# Patient Record
Sex: Female | Born: 1935
Health system: Southern US, Community
[De-identification: ages and names within clinical notes are randomized; demographics above are authoritative.]

## PROBLEM LIST (undated history)

## (undated) DIAGNOSIS — J302 Other seasonal allergic rhinitis: Secondary | ICD-10-CM

## (undated) DIAGNOSIS — H409 Unspecified glaucoma: Secondary | ICD-10-CM

## (undated) DIAGNOSIS — I639 Cerebral infarction, unspecified: Secondary | ICD-10-CM

## (undated) DIAGNOSIS — M1990 Unspecified osteoarthritis, unspecified site: Secondary | ICD-10-CM

## (undated) DIAGNOSIS — K219 Gastro-esophageal reflux disease without esophagitis: Secondary | ICD-10-CM

## (undated) DIAGNOSIS — F419 Anxiety disorder, unspecified: Secondary | ICD-10-CM

## (undated) DIAGNOSIS — I739 Peripheral vascular disease, unspecified: Secondary | ICD-10-CM

## (undated) DIAGNOSIS — J45909 Unspecified asthma, uncomplicated: Secondary | ICD-10-CM

## (undated) DIAGNOSIS — H3530 Unspecified macular degeneration: Secondary | ICD-10-CM

## (undated) DIAGNOSIS — R600 Localized edema: Secondary | ICD-10-CM

## (undated) DIAGNOSIS — R001 Bradycardia, unspecified: Secondary | ICD-10-CM

## (undated) DIAGNOSIS — I10 Essential (primary) hypertension: Secondary | ICD-10-CM

## (undated) DIAGNOSIS — T50905A Adverse effect of unspecified drugs, medicaments and biological substances, initial encounter: Secondary | ICD-10-CM

## (undated) HISTORY — DX: Unspecified glaucoma: H40.9

## (undated) HISTORY — DX: Unspecified osteoarthritis, unspecified site: M19.90

## (undated) HISTORY — DX: Localized edema: R60.0

## (undated) HISTORY — DX: Unspecified asthma, uncomplicated: J45.909

## (undated) HISTORY — DX: Essential (primary) hypertension: I10

## (undated) HISTORY — DX: Gastro-esophageal reflux disease without esophagitis: K21.9

## (undated) HISTORY — DX: Adverse effect of unspecified drugs, medicaments and biological substances, initial encounter: T50.905A

## (undated) HISTORY — DX: Unspecified macular degeneration: H35.30

## (undated) HISTORY — DX: Other seasonal allergic rhinitis: J30.2

## (undated) HISTORY — DX: Cerebral infarction, unspecified: I63.9

## (undated) HISTORY — DX: Bradycardia, unspecified: R00.1

## (undated) HISTORY — DX: Anxiety disorder, unspecified: F41.9

## (undated) HISTORY — DX: Peripheral vascular disease, unspecified: I73.9

---
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
## 1979-10-08 HISTORY — PX: ABDOMINAL HYSTERECTOMY: SHX81

## 1998-08-25 ENCOUNTER — Ambulatory Visit (HOSPITAL_COMMUNITY): Admission: RE | Admit: 1998-08-25 | Discharge: 1998-08-25 | Payer: Self-pay | Admitting: *Deleted

## 1999-03-28 ENCOUNTER — Ambulatory Visit (HOSPITAL_BASED_OUTPATIENT_CLINIC_OR_DEPARTMENT_OTHER): Admission: RE | Admit: 1999-03-28 | Discharge: 1999-03-28 | Payer: Self-pay | Admitting: Ophthalmology

## 2000-05-06 ENCOUNTER — Encounter: Admission: RE | Admit: 2000-05-06 | Discharge: 2000-05-06 | Payer: Self-pay | Admitting: *Deleted

## 2000-05-06 ENCOUNTER — Encounter: Payer: Self-pay | Admitting: *Deleted

## 2000-05-22 ENCOUNTER — Emergency Department (HOSPITAL_COMMUNITY): Admission: EM | Admit: 2000-05-22 | Discharge: 2000-05-22 | Payer: Self-pay | Admitting: Emergency Medicine

## 2000-05-22 ENCOUNTER — Encounter: Payer: Self-pay | Admitting: Emergency Medicine

## 2000-05-27 ENCOUNTER — Encounter: Payer: Self-pay | Admitting: Neurological Surgery

## 2000-05-27 ENCOUNTER — Encounter: Admission: RE | Admit: 2000-05-27 | Discharge: 2000-05-27 | Payer: Self-pay | Admitting: Neurological Surgery

## 2000-06-13 ENCOUNTER — Encounter: Admission: RE | Admit: 2000-06-13 | Discharge: 2000-09-11 | Payer: Self-pay | Admitting: Orthopedic Surgery

## 2001-01-06 ENCOUNTER — Encounter: Payer: Self-pay | Admitting: Ophthalmology

## 2001-01-09 ENCOUNTER — Ambulatory Visit (HOSPITAL_COMMUNITY): Admission: RE | Admit: 2001-01-09 | Discharge: 2001-01-09 | Payer: Self-pay | Admitting: Ophthalmology

## 2001-03-30 ENCOUNTER — Ambulatory Visit (HOSPITAL_COMMUNITY): Admission: RE | Admit: 2001-03-30 | Discharge: 2001-03-30 | Payer: Self-pay | Admitting: Ophthalmology

## 2001-06-22 ENCOUNTER — Ambulatory Visit (HOSPITAL_COMMUNITY): Admission: RE | Admit: 2001-06-22 | Discharge: 2001-06-22 | Payer: Self-pay | Admitting: Ophthalmology

## 2001-07-30 ENCOUNTER — Encounter: Payer: Self-pay | Admitting: *Deleted

## 2001-07-30 ENCOUNTER — Encounter: Admission: RE | Admit: 2001-07-30 | Discharge: 2001-07-30 | Payer: Self-pay | Admitting: *Deleted

## 2002-08-02 ENCOUNTER — Encounter: Payer: Self-pay | Admitting: *Deleted

## 2002-08-02 ENCOUNTER — Encounter: Admission: RE | Admit: 2002-08-02 | Discharge: 2002-08-02 | Payer: Self-pay | Admitting: *Deleted

## 2002-10-19 ENCOUNTER — Encounter: Admission: RE | Admit: 2002-10-19 | Discharge: 2002-10-19 | Payer: Self-pay | Admitting: *Deleted

## 2002-10-19 ENCOUNTER — Encounter: Payer: Self-pay | Admitting: *Deleted

## 2002-10-20 LAB — HM DEXA SCAN

## 2002-10-26 ENCOUNTER — Ambulatory Visit (HOSPITAL_COMMUNITY): Admission: RE | Admit: 2002-10-26 | Discharge: 2002-10-26 | Payer: Self-pay | Admitting: *Deleted

## 2002-10-26 ENCOUNTER — Encounter: Payer: Self-pay | Admitting: *Deleted

## 2003-08-16 ENCOUNTER — Encounter: Admission: RE | Admit: 2003-08-16 | Discharge: 2003-08-16 | Payer: Self-pay | Admitting: *Deleted

## 2004-09-06 ENCOUNTER — Encounter: Admission: RE | Admit: 2004-09-06 | Discharge: 2004-09-06 | Payer: Self-pay | Admitting: Obstetrics and Gynecology

## 2005-09-19 ENCOUNTER — Encounter: Admission: RE | Admit: 2005-09-19 | Discharge: 2005-09-19 | Payer: Self-pay | Admitting: Internal Medicine

## 2005-11-01 ENCOUNTER — Ambulatory Visit (HOSPITAL_COMMUNITY): Admission: RE | Admit: 2005-11-01 | Discharge: 2005-11-01 | Payer: Self-pay | Admitting: Internal Medicine

## 2006-02-21 ENCOUNTER — Encounter: Admission: RE | Admit: 2006-02-21 | Discharge: 2006-02-21 | Payer: Self-pay | Admitting: Gastroenterology

## 2006-09-22 ENCOUNTER — Encounter: Admission: RE | Admit: 2006-09-22 | Discharge: 2006-09-22 | Payer: Self-pay | Admitting: Internal Medicine

## 2007-09-24 ENCOUNTER — Encounter: Admission: RE | Admit: 2007-09-24 | Discharge: 2007-09-24 | Payer: Self-pay | Admitting: Internal Medicine

## 2008-09-28 ENCOUNTER — Encounter: Admission: RE | Admit: 2008-09-28 | Discharge: 2008-09-28 | Payer: Self-pay | Admitting: Internal Medicine

## 2009-07-04 ENCOUNTER — Inpatient Hospital Stay (HOSPITAL_COMMUNITY): Admission: EM | Admit: 2009-07-04 | Discharge: 2009-07-06 | Payer: Self-pay | Admitting: Emergency Medicine

## 2009-07-04 ENCOUNTER — Emergency Department (HOSPITAL_COMMUNITY): Admission: EM | Admit: 2009-07-04 | Discharge: 2009-07-04 | Payer: Self-pay | Admitting: Family Medicine

## 2009-07-06 ENCOUNTER — Encounter (INDEPENDENT_AMBULATORY_CARE_PROVIDER_SITE_OTHER): Payer: Self-pay | Admitting: Internal Medicine

## 2010-06-26 ENCOUNTER — Encounter: Admission: RE | Admit: 2010-06-26 | Discharge: 2010-06-26 | Payer: Self-pay | Admitting: Anesthesiology

## 2010-10-28 ENCOUNTER — Encounter: Payer: Self-pay | Admitting: Internal Medicine

## 2011-01-11 LAB — DIFFERENTIAL
Lymphocytes Relative: 31 % (ref 12–46)
Lymphs Abs: 1.3 10*3/uL (ref 0.7–4.0)
Neutro Abs: 2.3 10*3/uL (ref 1.7–7.7)
Neutrophils Relative %: 57 % (ref 43–77)

## 2011-01-11 LAB — COMPREHENSIVE METABOLIC PANEL
CO2: 27 mEq/L (ref 19–32)
Calcium: 9 mg/dL (ref 8.4–10.5)
Creatinine, Ser: 0.86 mg/dL (ref 0.4–1.2)
GFR calc Af Amer: >60 mL/min (ref >60)
GFR calc non Af Amer: >60 mL/min (ref >60)
Glucose, Bld: 110 mg/dL — ABNORMAL HIGH (ref 70–99)
Total Bilirubin: 0.9 mg/dL (ref 0.3–1.2)
Total Protein: 7.2 g/dL (ref 6.0–8.3)

## 2011-01-11 LAB — BASIC METABOLIC PANEL
BUN: 13 mg/dL (ref 6–23)
CO2: 24 mEq/L (ref 19–32)
Calcium: 8.6 mg/dL (ref 8.4–10.5)
Chloride: 106 mEq/L (ref 96–112)
Creatinine, Ser: 0.98 mg/dL (ref 0.4–1.2)
Glucose, Bld: 120 mg/dL — ABNORMAL HIGH (ref 70–99)
Potassium: 3.4 mEq/L — ABNORMAL LOW (ref 3.5–5.1)

## 2011-01-11 LAB — CARDIAC PANEL(CRET KIN+CKTOT+MB+TROPI)
CK, MB: 1.7 ng/mL (ref 0.3–4.0)
CK, MB: 1.9 ng/mL (ref 0.3–4.0)
Relative Index: 1.5 (ref 0.0–2.5)
Relative Index: 1.6 (ref 0.0–2.5)
Total CK: 122 U/L (ref 7–177)
Troponin I: 0.01 ng/mL (ref 0.00–0.06)

## 2011-01-11 LAB — POCT CARDIAC MARKERS
CKMB, poc: 1.9 ng/mL (ref 1.0–8.0)
Myoglobin, poc: 122 ng/mL (ref 12–200)

## 2011-01-11 LAB — CBC
HCT: 36.4 % (ref 36.0–46.0)
Hemoglobin: 12.4 g/dL (ref 12.0–15.0)
Hemoglobin: 12.9 g/dL (ref 12.0–15.0)
MCHC: 34.1 g/dL (ref 30.0–36.0)
MCV: 94.8 fL (ref 78.0–100.0)
MCV: 95 fL (ref 78.0–100.0)
Platelets: 178 10*3/uL (ref 150–400)
Platelets: 181 10*3/uL (ref 150–400)
RBC: 3.84 MIL/uL — ABNORMAL LOW (ref 3.87–5.11)
RBC: 4.02 MIL/uL (ref 3.87–5.11)
WBC: 4.1 10*3/uL (ref 4.0–10.5)

## 2011-01-11 LAB — APTT: aPTT: 32 seconds (ref 24–37)

## 2011-01-11 LAB — MAGNESIUM: Magnesium: 2.3 mg/dL (ref 1.5–2.5)

## 2011-01-11 LAB — PROTIME-INR: Prothrombin Time: 13.6 seconds (ref 11.6–15.2)

## 2011-02-22 NOTE — Op Note (Signed)
[place]. [facility]  Patient:    [name] [name], [name] [name]                       MRN: [id number] Proc. Date: 03/31/01 Adm. Date:  [id number] Disc. Date: [id number] Attending:  Karenann Cai                           Operative Report  PREOPERATIVE DIAGNOSIS:  Immature cataract, left eye.  POSTOPERATIVE DIAGNOSIS:  Immature cataract, left eye.  OPERATION PERFORMED:  Kelman phacoemulsification of cataract, left eye.  SURGEON:  Marya Landry. Carlyle Lipa., M.D.  ANESTHESIA:  Local using Xylocaine 2% with Marcaine 0.75% with Wydase.  INDICATIONS FOR PROCEDURE:  The patient is a 77 year old lady who has undergone a cataract extraction from the right eye but now has developed a significant cataract of the left eye.  She has a visual acuity best corrected at 20/25 on the right and 20/100 on the left.  She complains of difficulty seeing to read.  Cataract extraction of the left eye was recommended.  She was admitted at this time for that purpose.  DESCRIPTION OF PROCEDURE:  Under influence of intravenous sedation, a Van Lint akinesia and retrobulbar anesthesia was given.  The patient was prepped and draped in the usual manner.  The lid speculum was inserted under the upper and lower lid of the left eye and a 4-0 silk traction suture was passed through the belly of the superior rectus muscle for traction.  A fornix based conjunctival flap was turned and hemostasis achieved by using cautery.  An incision was made in the sclera approximately 1 mm posterior to the limbus. This incision was dissected down to the clear cornea using a crescent blade. A side port incision was made at the 1:30 oclock position.  OcuCoat was injected into the eye through the side port incision.  The anterior chamber was then entered through the corneoscleral tunnel incision at the 11:30 oclock position.  An anterior capsulotomy was done using a bent 25 gauge needle.  The nucleus was  hydrodissected using Xylocaine. The KP handpiece was passed into the eye and the nucleus was emulsified without difficulty.  The residual cortical material was aspirated.  It was noted that there was a tear in the posterior capsule.  An anterior vitrectomy was done and an anterior chamber lens was seated across the iris into the chamber and without difficulty.  A peripheral iridectomy was done.  The corneoscleral wound was closed by using a combination of interrupted sutures of 8-0 Vicryl and 10-0 nylon.  After it was ascertained that the wound was airtight and watertight, the conjunctiva was closed using thermal cautery.  1 cc of Celestone and 0.5 cc of gentamicin were injected subconjunctivally.  Maxitrol ophthalmic ointment and Pilopine ointment and atropine drops were applied along with a patch and Fox shield.  The patient tolerated the procedure well and was discharged to the post anesthesia care unit in satisfactory condition.  She was instructed to rest today, to take Vicodin every four hours as needed for pain and to see me in the office tomorrow for further evaluation.  DISCHARGE DIAGNOSES:  Immature cataract, left eye. DD:  03/31/01 TD:  03/31/01 Job: [id number]

## 2011-02-22 NOTE — Op Note (Signed)
[place]. [facility]  Patient:    [name] [name], [name] [name]                       MRN: [id number] Adm. Date:  [id number] Disc. Date: [id number] Attending:  Karenann Cai                           Operative Report  PREOPERATIVE DIAGNOSIS:  Immature cataract, right eye.  POSTOPERATIVE DIAGNOSIS:  Immature cataract, right eye.  PROCEDURE:  Kelman phacoemulsification of cataract, right eye.  ANESTHESIA:  Local using Xylocaine 2% with Marcaine 0.75% with Wydase.  JUSTIFICATION FOR PROCEDURE:  This is a 77 year old lady who complains of blurring of vision with difficulty seeing to read.  She was evaluated and found to have a visual acuity best corrected at 20/100 on the right, 20/80 on the left.  There are bilateral immature cataracts, worse on the right than the left.  She also has a history of glaucoma, which is presently controlled with medications.  Cataract extraction of the right eye was recommended.  She is admitted now for that purpose.  DESCRIPTION OF PROCEDURE:  Under the influence of IV sedation and Van Lint akinesia, a retrobulbar anesthesia was given.  The patient was prepped and draped in the usual manner.  The lid speculum was inserted under the upper and lower lid of the right eye and a 4-0 silk traction suture was passed through the belly of the superior rectus muscle for retraction.  A fornix-based conjunctival flap was turned and hemostasis achieved by using cautery.  An incision made in the sclera approximately 1 mm posterior to the limbus.  This incision was dissected down to clear cornea using the crescent blade.  A side port incision was made at the 1:30 clock hour position, and Occucoat was injected into the eye through the side port incision.  The anterior chamber was then entered through the corneoscleral tunnel incision at the 11:30 position.  An anterior capsulotomy was done using a bent 25-gauge needle.  The nucleus was  hydrodissected using Xylocaine.  The KPE handpiece was passed into the eye, and the nucleus was emulsified without difficulty.  The residual cortical material was aspirated.  The posterior capsule was then polished using the olive-tip polisher.  The wound was widened slightly to accommodate a foldable silicone intraocular lens.  This lens was seated into the eye behind the iris into the capsular bag without difficulty.  The residual Occucoat was aspirated from the eye.  The anterior chamber was reformed, and the pupil was constricted using Miochol.  The wound was tested to make sure that it was airtight and watertight.  The conjunctiva was then closed over the corneoscleral wound using thermal cautery.  Celestone 1 cc and 0.5 cc of gentamicin were injected subconjunctivally.  Maxitrol ophthalmic ointment and Pilopine ointment were applied along with a patch and Fox shield.  The patient tolerated the procedure well, was discharged to postanesthesia recovery in satisfactory condition.  She was instructed to rest today, to take Vicodin every four hours as needed for pain, and to see me in my office tomorrow for further evaluation.  DISCHARGE DIAGNOSIS:  Immature cataract, right eye. DD:  01/10/01 TD:  01/10/01 Job: [id number]

## 2011-03-15 ENCOUNTER — Ambulatory Visit
Admission: RE | Admit: 2011-03-15 | Discharge: 2011-03-15 | Disposition: A | Discharge status: Home / Self Care | Payer: Medicare Other | Source: Ambulatory Visit | Attending: Allergy and Immunology | Admitting: Allergy and Immunology

## 2011-03-15 ENCOUNTER — Other Ambulatory Visit: Payer: Self-pay | Admitting: Allergy and Immunology

## 2011-03-15 DIAGNOSIS — R05 Cough: Secondary | ICD-10-CM

## 2011-10-14 DIAGNOSIS — K589 Irritable bowel syndrome without diarrhea: Secondary | ICD-10-CM | POA: Diagnosis not present

## 2011-10-14 DIAGNOSIS — H5710 Ocular pain, unspecified eye: Secondary | ICD-10-CM | POA: Diagnosis not present

## 2011-10-14 DIAGNOSIS — I10 Essential (primary) hypertension: Secondary | ICD-10-CM | POA: Diagnosis not present

## 2011-10-16 DIAGNOSIS — H4011X0 Primary open-angle glaucoma, stage unspecified: Secondary | ICD-10-CM | POA: Diagnosis not present

## 2011-10-16 DIAGNOSIS — H20019 Primary iridocyclitis, unspecified eye: Secondary | ICD-10-CM | POA: Diagnosis not present

## 2011-11-05 DIAGNOSIS — J45901 Unspecified asthma with (acute) exacerbation: Secondary | ICD-10-CM | POA: Diagnosis not present

## 2011-11-22 DIAGNOSIS — H4011X0 Primary open-angle glaucoma, stage unspecified: Secondary | ICD-10-CM | POA: Diagnosis not present

## 2011-11-22 DIAGNOSIS — H20019 Primary iridocyclitis, unspecified eye: Secondary | ICD-10-CM | POA: Diagnosis not present

## 2011-12-06 DIAGNOSIS — J3089 Other allergic rhinitis: Secondary | ICD-10-CM | POA: Diagnosis not present

## 2011-12-06 DIAGNOSIS — I10 Essential (primary) hypertension: Secondary | ICD-10-CM | POA: Diagnosis not present

## 2011-12-06 DIAGNOSIS — G4700 Insomnia, unspecified: Secondary | ICD-10-CM | POA: Diagnosis not present

## 2011-12-13 DIAGNOSIS — M5137 Other intervertebral disc degeneration, lumbosacral region: Secondary | ICD-10-CM | POA: Diagnosis not present

## 2012-01-06 DIAGNOSIS — M5430 Sciatica, unspecified side: Secondary | ICD-10-CM | POA: Diagnosis not present

## 2012-01-09 DIAGNOSIS — J45909 Unspecified asthma, uncomplicated: Secondary | ICD-10-CM | POA: Diagnosis not present

## 2012-01-09 DIAGNOSIS — N390 Urinary tract infection, site not specified: Secondary | ICD-10-CM | POA: Diagnosis not present

## 2012-01-09 DIAGNOSIS — I10 Essential (primary) hypertension: Secondary | ICD-10-CM | POA: Diagnosis not present

## 2012-01-20 DIAGNOSIS — L03039 Cellulitis of unspecified toe: Secondary | ICD-10-CM | POA: Diagnosis not present

## 2012-01-21 DIAGNOSIS — H01009 Unspecified blepharitis unspecified eye, unspecified eyelid: Secondary | ICD-10-CM | POA: Diagnosis not present

## 2012-01-21 DIAGNOSIS — H00019 Hordeolum externum unspecified eye, unspecified eyelid: Secondary | ICD-10-CM | POA: Diagnosis not present

## 2012-01-24 DIAGNOSIS — H00019 Hordeolum externum unspecified eye, unspecified eyelid: Secondary | ICD-10-CM | POA: Diagnosis not present

## 2012-01-24 DIAGNOSIS — H01009 Unspecified blepharitis unspecified eye, unspecified eyelid: Secondary | ICD-10-CM | POA: Diagnosis not present

## 2012-01-31 DIAGNOSIS — H01009 Unspecified blepharitis unspecified eye, unspecified eyelid: Secondary | ICD-10-CM | POA: Diagnosis not present

## 2012-01-31 DIAGNOSIS — H00019 Hordeolum externum unspecified eye, unspecified eyelid: Secondary | ICD-10-CM | POA: Diagnosis not present

## 2012-02-03 DIAGNOSIS — M5137 Other intervertebral disc degeneration, lumbosacral region: Secondary | ICD-10-CM | POA: Diagnosis not present

## 2012-02-06 DIAGNOSIS — J45909 Unspecified asthma, uncomplicated: Secondary | ICD-10-CM | POA: Diagnosis not present

## 2012-02-06 DIAGNOSIS — M129 Arthropathy, unspecified: Secondary | ICD-10-CM | POA: Diagnosis not present

## 2012-02-06 DIAGNOSIS — Q245 Malformation of coronary vessels: Secondary | ICD-10-CM | POA: Diagnosis not present

## 2012-02-06 DIAGNOSIS — I10 Essential (primary) hypertension: Secondary | ICD-10-CM | POA: Diagnosis not present

## 2012-02-17 DIAGNOSIS — H01009 Unspecified blepharitis unspecified eye, unspecified eyelid: Secondary | ICD-10-CM | POA: Diagnosis not present

## 2012-02-17 DIAGNOSIS — H4011X0 Primary open-angle glaucoma, stage unspecified: Secondary | ICD-10-CM | POA: Diagnosis not present

## 2012-02-17 DIAGNOSIS — H20029 Recurrent acute iridocyclitis, unspecified eye: Secondary | ICD-10-CM | POA: Diagnosis not present

## 2012-02-21 DIAGNOSIS — J45901 Unspecified asthma with (acute) exacerbation: Secondary | ICD-10-CM | POA: Diagnosis not present

## 2012-03-19 DIAGNOSIS — H4011X0 Primary open-angle glaucoma, stage unspecified: Secondary | ICD-10-CM | POA: Diagnosis not present

## 2012-03-19 DIAGNOSIS — H20029 Recurrent acute iridocyclitis, unspecified eye: Secondary | ICD-10-CM | POA: Diagnosis not present

## 2012-04-27 DIAGNOSIS — H4011X0 Primary open-angle glaucoma, stage unspecified: Secondary | ICD-10-CM | POA: Diagnosis not present

## 2012-04-27 DIAGNOSIS — H1045 Other chronic allergic conjunctivitis: Secondary | ICD-10-CM | POA: Diagnosis not present

## 2012-06-04 DIAGNOSIS — L03039 Cellulitis of unspecified toe: Secondary | ICD-10-CM | POA: Diagnosis not present

## 2012-06-09 DIAGNOSIS — I10 Essential (primary) hypertension: Secondary | ICD-10-CM | POA: Diagnosis not present

## 2012-06-09 DIAGNOSIS — J45909 Unspecified asthma, uncomplicated: Secondary | ICD-10-CM | POA: Diagnosis not present

## 2012-06-18 DIAGNOSIS — J309 Allergic rhinitis, unspecified: Secondary | ICD-10-CM | POA: Diagnosis not present

## 2012-06-18 DIAGNOSIS — J45909 Unspecified asthma, uncomplicated: Secondary | ICD-10-CM | POA: Diagnosis not present

## 2012-06-25 DIAGNOSIS — J45909 Unspecified asthma, uncomplicated: Secondary | ICD-10-CM | POA: Diagnosis not present

## 2012-06-25 DIAGNOSIS — J3089 Other allergic rhinitis: Secondary | ICD-10-CM | POA: Diagnosis not present

## 2012-06-25 DIAGNOSIS — I10 Essential (primary) hypertension: Secondary | ICD-10-CM | POA: Diagnosis not present

## 2012-06-29 DIAGNOSIS — M5412 Radiculopathy, cervical region: Secondary | ICD-10-CM | POA: Diagnosis not present

## 2012-07-07 DIAGNOSIS — H4011X0 Primary open-angle glaucoma, stage unspecified: Secondary | ICD-10-CM | POA: Diagnosis not present

## 2012-07-07 DIAGNOSIS — H1045 Other chronic allergic conjunctivitis: Secondary | ICD-10-CM | POA: Diagnosis not present

## 2012-07-24 DIAGNOSIS — L03039 Cellulitis of unspecified toe: Secondary | ICD-10-CM | POA: Diagnosis not present

## 2012-07-30 DIAGNOSIS — Z131 Encounter for screening for diabetes mellitus: Secondary | ICD-10-CM | POA: Diagnosis not present

## 2012-07-30 DIAGNOSIS — I10 Essential (primary) hypertension: Secondary | ICD-10-CM | POA: Diagnosis not present

## 2012-07-30 DIAGNOSIS — J3089 Other allergic rhinitis: Secondary | ICD-10-CM | POA: Diagnosis not present

## 2012-07-30 DIAGNOSIS — Z23 Encounter for immunization: Secondary | ICD-10-CM | POA: Diagnosis not present

## 2012-08-06 DIAGNOSIS — H20019 Primary iridocyclitis, unspecified eye: Secondary | ICD-10-CM | POA: Diagnosis not present

## 2012-08-06 DIAGNOSIS — H4011X0 Primary open-angle glaucoma, stage unspecified: Secondary | ICD-10-CM | POA: Diagnosis not present

## 2012-08-24 DIAGNOSIS — H4011X0 Primary open-angle glaucoma, stage unspecified: Secondary | ICD-10-CM | POA: Diagnosis not present

## 2012-08-24 DIAGNOSIS — H20019 Primary iridocyclitis, unspecified eye: Secondary | ICD-10-CM | POA: Diagnosis not present

## 2012-08-28 DIAGNOSIS — H4011X0 Primary open-angle glaucoma, stage unspecified: Secondary | ICD-10-CM | POA: Diagnosis not present

## 2012-08-28 DIAGNOSIS — H20019 Primary iridocyclitis, unspecified eye: Secondary | ICD-10-CM | POA: Diagnosis not present

## 2012-08-31 DIAGNOSIS — M47812 Spondylosis without myelopathy or radiculopathy, cervical region: Secondary | ICD-10-CM | POA: Diagnosis not present

## 2012-09-28 DIAGNOSIS — H4011X0 Primary open-angle glaucoma, stage unspecified: Secondary | ICD-10-CM | POA: Diagnosis not present

## 2012-11-02 DIAGNOSIS — M47812 Spondylosis without myelopathy or radiculopathy, cervical region: Secondary | ICD-10-CM | POA: Diagnosis not present

## 2012-11-06 DIAGNOSIS — H4011X0 Primary open-angle glaucoma, stage unspecified: Secondary | ICD-10-CM | POA: Diagnosis not present

## 2012-11-25 DIAGNOSIS — K649 Unspecified hemorrhoids: Secondary | ICD-10-CM | POA: Diagnosis not present

## 2012-11-25 DIAGNOSIS — Q245 Malformation of coronary vessels: Secondary | ICD-10-CM | POA: Diagnosis not present

## 2012-11-25 DIAGNOSIS — J45909 Unspecified asthma, uncomplicated: Secondary | ICD-10-CM | POA: Diagnosis not present

## 2012-11-25 DIAGNOSIS — I10 Essential (primary) hypertension: Secondary | ICD-10-CM | POA: Diagnosis not present

## 2012-11-27 DIAGNOSIS — H04129 Dry eye syndrome of unspecified lacrimal gland: Secondary | ICD-10-CM | POA: Diagnosis not present

## 2012-11-27 DIAGNOSIS — H4011X0 Primary open-angle glaucoma, stage unspecified: Secondary | ICD-10-CM | POA: Diagnosis not present

## 2012-12-25 DIAGNOSIS — H4011X0 Primary open-angle glaucoma, stage unspecified: Secondary | ICD-10-CM | POA: Diagnosis not present

## 2013-02-12 DIAGNOSIS — M19049 Primary osteoarthritis, unspecified hand: Secondary | ICD-10-CM | POA: Diagnosis not present

## 2013-02-24 DIAGNOSIS — H20019 Primary iridocyclitis, unspecified eye: Secondary | ICD-10-CM | POA: Diagnosis not present

## 2013-02-24 DIAGNOSIS — H4011X0 Primary open-angle glaucoma, stage unspecified: Secondary | ICD-10-CM | POA: Diagnosis not present

## 2013-03-16 DIAGNOSIS — J029 Acute pharyngitis, unspecified: Secondary | ICD-10-CM | POA: Diagnosis not present

## 2013-03-16 DIAGNOSIS — J3089 Other allergic rhinitis: Secondary | ICD-10-CM | POA: Diagnosis not present

## 2013-03-16 DIAGNOSIS — J209 Acute bronchitis, unspecified: Secondary | ICD-10-CM | POA: Diagnosis not present

## 2013-04-15 DIAGNOSIS — J3089 Other allergic rhinitis: Secondary | ICD-10-CM | POA: Diagnosis not present

## 2013-04-15 DIAGNOSIS — J45902 Unspecified asthma with status asthmaticus: Secondary | ICD-10-CM | POA: Diagnosis not present

## 2013-04-15 DIAGNOSIS — I10 Essential (primary) hypertension: Secondary | ICD-10-CM | POA: Diagnosis not present

## 2013-04-22 DIAGNOSIS — J3089 Other allergic rhinitis: Secondary | ICD-10-CM | POA: Diagnosis not present

## 2013-04-22 DIAGNOSIS — J45909 Unspecified asthma, uncomplicated: Secondary | ICD-10-CM | POA: Diagnosis not present

## 2013-04-27 DIAGNOSIS — H4011X0 Primary open-angle glaucoma, stage unspecified: Secondary | ICD-10-CM | POA: Diagnosis not present

## 2013-05-27 ENCOUNTER — Encounter: Payer: Self-pay | Admitting: *Deleted

## 2013-05-28 ENCOUNTER — Ambulatory Visit (INDEPENDENT_AMBULATORY_CARE_PROVIDER_SITE_OTHER): Payer: Medicare Other | Admitting: Internal Medicine

## 2013-05-28 ENCOUNTER — Encounter: Payer: Self-pay | Admitting: Internal Medicine

## 2013-05-28 VITALS — BP 138/84 | HR 74 | Temp 98.0°F | Resp 14 | Ht 62.5 in | Wt 140.8 lb

## 2013-05-28 DIAGNOSIS — J302 Other seasonal allergic rhinitis: Secondary | ICD-10-CM

## 2013-05-28 DIAGNOSIS — K449 Diaphragmatic hernia without obstruction or gangrene: Secondary | ICD-10-CM | POA: Diagnosis not present

## 2013-05-28 DIAGNOSIS — K219 Gastro-esophageal reflux disease without esophagitis: Secondary | ICD-10-CM | POA: Diagnosis not present

## 2013-05-28 DIAGNOSIS — I10 Essential (primary) hypertension: Secondary | ICD-10-CM

## 2013-05-28 DIAGNOSIS — R1032 Left lower quadrant pain: Secondary | ICD-10-CM

## 2013-05-28 DIAGNOSIS — I739 Peripheral vascular disease, unspecified: Secondary | ICD-10-CM | POA: Insufficient documentation

## 2013-05-28 DIAGNOSIS — K5289 Other specified noninfective gastroenteritis and colitis: Secondary | ICD-10-CM

## 2013-05-28 DIAGNOSIS — K529 Noninfective gastroenteritis and colitis, unspecified: Secondary | ICD-10-CM

## 2013-05-28 DIAGNOSIS — F419 Anxiety disorder, unspecified: Secondary | ICD-10-CM

## 2013-05-28 DIAGNOSIS — E663 Overweight: Secondary | ICD-10-CM

## 2013-05-28 DIAGNOSIS — J45909 Unspecified asthma, uncomplicated: Secondary | ICD-10-CM

## 2013-05-28 DIAGNOSIS — M1990 Unspecified osteoarthritis, unspecified site: Secondary | ICD-10-CM | POA: Insufficient documentation

## 2013-05-28 DIAGNOSIS — K5730 Diverticulosis of large intestine without perforation or abscess without bleeding: Secondary | ICD-10-CM | POA: Diagnosis not present

## 2013-05-28 DIAGNOSIS — F411 Generalized anxiety disorder: Secondary | ICD-10-CM

## 2013-05-28 DIAGNOSIS — H409 Unspecified glaucoma: Secondary | ICD-10-CM | POA: Insufficient documentation

## 2013-05-28 DIAGNOSIS — J309 Allergic rhinitis, unspecified: Secondary | ICD-10-CM

## 2013-05-28 MED ORDER — LANSOPRAZOLE 30 MG PO CPDR: 30.0000 mg | DELAYED_RELEASE_CAPSULE | Freq: Every day | ORAL | Status: DC

## 2013-05-28 NOTE — Progress Notes (Signed)
[name], female   DOB: 1924-09-11, 80 y.o.   MRN: [id number] Location:  [facility] / [place] [facility]  Code Status: Does have living will   Allergies  Allergen Reactions  . Aspirin   . Codeine   . Iodine   . Prednisone   . Sulfa Antibiotics     Chief Complaint  Patient presents with  . NP to Establish    HPI: Patient is a 79 y.o. female seen in the office today to establish with the practice.    Having trouble with her stomach.  Doctor told her it would just get better.  Wants a new primary physician.    Hypertension:  Since 1989.  Controlled at this time.  No headaches mostly.  Had complications of left eye cataract surgery and gets pain there sometimes during reading.   No difficulty with balance or falls.  Does not use a cane.    Arthritis:  Has it all over.  If she keeps her body warm, eats right, and does a little exercise--stretches on the bed, walks--200-300 steps w/in her home.  Can avoid oral meds.  Uses topical rubs primarily in lower back and shoulders.  Uses only as needed and not needed recently.  Seasonal allergies:  For 2.5 to 3 years were bad--was having allergy attacks.  Seems immune system now up and she has not had problems since last September.    Asthma:  Goes with allergies.  Sees Dr. Madie Reno only since 7/17.  Had allergy testing at that time.  Breathing very well.  Not dyspneic walking her steps at home.    Has peripheral vascular disease in her legs.  Unsure when she had this tested last.  Had echo in 2007 with normal EF 60%, trivial pericardial effusion, and mild mitral regurgitation.  Glaucoma: is under control using drops as directed.  Sees Dr. Mitzi Davenport.    Has anxiety difficulty--says she is not anxious.  Does take clorazepate 1/2 daily.  Since the 1990s.  Had bottle of 90 and it lasts over a year.    Has severe reactions to meds frequently.  Pt's son is a Lawyer.    Sulfa made her retain fluid.  Aspirin  caused shortness of breath.    Had hepatitis in 1969 induced by medication--unknown which one.    Stomach problems:  Off and on has had colitis.  Prednisone affected her stomach badly.  Cannot take nsaids so prednisone was also given for arthritis 2.5mg  only and still upset her stomach.  Irritation of stomach.  Is better than it was.  Eats certain things that will flare it up--soft foods.  Also has diverticulosis--had diverticulitis at some point so does not eat seeds like tomatoes.  No noted correlation with acidic foods.  Left lower quadrant.  Was not on PPI at the time she was given prednisone.  Discussed gastritis vs. Ulcer.  Takes it for a period of several days, but less than 2 wks.  Doesn't really feel like nexium helped her.  Did take prevacid in the past which helped her when she was young and helped her significant.  Not sure she had protonix.  Prilosec did not help her either.  Used zantac, but does not recall effects.  Has had EGD at least 5-7 years ago.  Has never been able to have cscope.  Had a barium enema, but no further recommended any longer.  Only imaging has been during hospitalizations.  Has hiatal hernia.  Review of Systems:  Review of Systems  Constitutional: Negative for fever and chills.  HENT: Positive for neck pain. Negative for hearing loss.        Dry mouth  Eyes: Positive for blurred vision.       Dry eyes  Respiratory: Negative for cough and shortness of breath.   Cardiovascular: Negative for chest pain, palpitations and leg swelling.       Hypertension  Gastrointestinal: Positive for heartburn, abdominal pain and diarrhea. Negative for nausea, vomiting, constipation, blood in stool and melena.       Has difficulty with gas and feels like she swallows gas when breathing  Genitourinary: Negative for dysuria.       No incontinence  Musculoskeletal: Positive for back pain and joint pain. Negative for falls.  Skin: Negative for rash.  Neurological: Positive for  weakness. Negative for dizziness.  Psychiatric/Behavioral: Negative for depression and memory loss. The patient is nervous/anxious.     Past Medical History  Diagnosis Date  . Unspecified essential hypertension   . Arthritis   . Reflux   . Seasonal allergies   . Asthma   . Glaucoma   . Vascular disease   . Anxiety    Past Surgical History  Procedure Laterality Date  . Abdominal hysterectomy  1981  . Tonsillectomy  1953    Social History:   reports that she has never smoked. She does not have any smokeless tobacco history on file. She reports that she does not drink alcohol or use illicit drugs.  Family History  Problem Relation Age of Onset  . Stroke Father     Medications: Patient's Medications  New Prescriptions   No medications on file  Previous Medications   ALBUTEROL (PROVENTIL HFA;VENTOLIN HFA) 108 (90 BASE) MCG/ACT INHALER    Inhale 2 puffs into the lungs every 6 (six) hours as needed for wheezing.   AMLODIPINE (NORVASC) 5 MG TABLET    Take 5 mg by mouth daily.   BRINZOLAMIDE (AZOPT) 1 % OPHTHALMIC SUSPENSION    Place 1 drop into both eyes 3 (three) times daily.   CALCIUM CARBONATE-VITAMIN D (CALTRATE 600+D) 600-400 MG-UNIT PER TABLET    Take 2 tablets by mouth daily.   CLORAZEPATE (TRANXENE) 3.75 MG TABLET    Take 1/2 tablet once daily   COENZYME Q10 (COQ10) 50 MG CAPS    Take one tablet by mouth once daily   ESOMEPRAZOLE (NEXIUM) 40 MG CAPSULE    Take 40 mg by mouth daily before breakfast.   HYOSCYAMINE (ANASPAZ) 0.125 MG TBDP TABLET    Place under the tongue. Take one tablet three times daily   LATANOPROST (XALATAN) 0.005 % OPHTHALMIC SOLUTION    Place 1 drop into both eyes at bedtime.   LORATADINE (CLARITIN) 10 MG TABLET    Take 10 mg by mouth daily.   LOTEPREDNOL (LOTEMAX) 0.5 % OPHTHALMIC SUSPENSION    Place 1 drop into the left eye daily.   OMEGA-3 FATTY ACIDS (FISH OIL) 1200 MG CPDR    Take one tablet by mouth once daily   QVAR 80 MCG/ACT INHALER    Inhale  two puffs twice daily; Rinse gargle and spit; Use with spacer   VITAMIN B-12 (CYANOCOBALAMIN) 1000 MCG TABLET    Take 1,000 mcg by mouth daily.   VITAMIN C (ASCORBIC ACID) 500 MG TABLET    Take 500 mg by mouth daily.  Modified Medications   No medications on file  Discontinued Medications   No medications on  file   Physical Exam: Filed Vitals:   05/28/13 0837  BP: 138/84  Pulse: 74  Temp: 98 F (36.7 C)  TempSrc: Oral  Resp: 14  Height: 5' 2.5" (1.588 m)  Weight: 140 lb 12.8 oz (63.866 kg)  Physical Exam  Constitutional: She is oriented to person, place, and time. She appears well-developed and well-nourished. No distress.  Obese AA female  HENT:  Head: Normocephalic and atraumatic.  Eyes: EOM are normal. Pupils are equal, round, and reactive to light.  Cardiovascular: Normal rate, regular rhythm, normal heart sounds and intact distal pulses.   Wears compression hose  Pulmonary/Chest: Effort normal and breath sounds normal. No respiratory distress.  Abdominal: Soft. Bowel sounds are normal. She exhibits no distension and no mass. There is tenderness. There is no rebound and no guarding. No hernia.  LLQ  Musculoskeletal: Normal range of motion. She exhibits no edema and no tenderness.  Neurological: She is alert and oriented to person, place, and time.  Skin: Skin is warm and dry.    Labs reviewed: No recent labs available, will need records  Past Procedures: Bone density 2004 osteopenia Mammogram 2009 nl  Assessment/Plan 1. GERD (gastroesophageal reflux disease) -poorly controlled it seems despite avoiding food triggers -only uses her ppi (nexium currently) intermittently b/c she thinks it actually causes some GI upset -suspect she has gastritis due to steroids and pain meds over the years also - lansoprazole (PREVACID) 30 MG capsule; Take 1 capsule (30 mg total) by mouth daily.  Dispense: 30 capsule; Refill: 3 - CT Abdomen Pelvis W Contrast; Future  2.  Diverticulosis of colon without hemorrhage - Comprehensive metabolic panel; Future - CT Abdomen Pelvis W Contrast; Future to evaluate LLQ pain, diverticulosis, hiatal hernia  3. Abdominal pain, left lower quadrant -? Due to diverticulosis--notes she had prior diverticulitis and modified her diet due to this - CBC with Differential; Future - Comprehensive metabolic panel; Future - lansoprazole (PREVACID) 30 MG capsule; Take 1 capsule (30 mg total) by mouth daily.  Dispense: 30 capsule; Refill: 3 - CT Abdomen Pelvis W Contrast; Future  4. Hiatal hernia -has not had benefit with any PPIs thus far except prevacid so will resume this in place of nexium  -suspect this is why her GERD and abdominal discomfort flares up more often - Comprehensive metabolic panel; Future - lansoprazole (PREVACID) 30 MG capsule; Take 1 capsule (30 mg total) by mouth daily.  Dispense: 30 capsule; Refill: 3  5. Obesity -encouraged her walking and low fat, low cholesterol diet - Lipid panel; Future  6. Colitis -has had bouts of this, not an active concern, but very sensitive to meds  7. Benign essential hypertension -at goal with amlodipine alone  8. Osteoarthritis Fairly generalized.  Recommend tylenol and topical agents for pain.  Keep joints lubricated with activity.    9. Seasonal allergies --Following with Dr. Madie Reno.  I appreciate his referral.  Using loratadine.  Notes this has been good recently.  10. Asthma --No flares since last September.  Doing well with Qvar, ventolin  11. Glaucoma --left eye with poor vision since problematic cataract surgery, continue latanoprost (b/l), azopt (b/l) and lotemax (just left)  12. PVD (peripheral vascular disease) --it sounds like she had ABIs long ago, but none recently, wears compression hose and has no edema today  13. Anxiety -on as needed 1/2 tab of clorazepate--risks of this medication discussed today--only uses 1-2x per week.   -will try to taper  her off altogether in the future  I attempted to do the smart set and order FLP and TSH, but obesity was not a covered diagnosis.  Labs/tests ordered:  Cbc, cmp, flp, wanted tsh but not covered. Next appt:  6 wks for EV

## 2013-05-31 ENCOUNTER — Ambulatory Visit
Admission: RE | Admit: 2013-05-31 | Discharge: 2013-05-31 | Disposition: A | Discharge status: Home / Self Care | Payer: Medicare Other | Source: Ambulatory Visit | Attending: Internal Medicine | Admitting: Internal Medicine

## 2013-05-31 DIAGNOSIS — K219 Gastro-esophageal reflux disease without esophagitis: Secondary | ICD-10-CM

## 2013-05-31 DIAGNOSIS — K5730 Diverticulosis of large intestine without perforation or abscess without bleeding: Secondary | ICD-10-CM

## 2013-05-31 DIAGNOSIS — R1032 Left lower quadrant pain: Secondary | ICD-10-CM

## 2013-05-31 DIAGNOSIS — N281 Cyst of kidney, acquired: Secondary | ICD-10-CM | POA: Diagnosis not present

## 2013-06-03 ENCOUNTER — Other Ambulatory Visit: Payer: Self-pay | Admitting: Internal Medicine

## 2013-06-03 DIAGNOSIS — R109 Unspecified abdominal pain: Secondary | ICD-10-CM

## 2013-06-03 DIAGNOSIS — R1032 Left lower quadrant pain: Secondary | ICD-10-CM

## 2013-06-09 DIAGNOSIS — H4011X0 Primary open-angle glaucoma, stage unspecified: Secondary | ICD-10-CM | POA: Diagnosis not present

## 2013-06-11 ENCOUNTER — Encounter: Payer: Self-pay | Admitting: Internal Medicine

## 2013-06-11 ENCOUNTER — Ambulatory Visit (INDEPENDENT_AMBULATORY_CARE_PROVIDER_SITE_OTHER): Payer: Medicare Other | Admitting: Internal Medicine

## 2013-06-11 VITALS — BP 130/78 | HR 76 | Temp 97.4°F | Resp 16 | Ht 62.0 in | Wt 141.8 lb

## 2013-06-11 DIAGNOSIS — Z23 Encounter for immunization: Secondary | ICD-10-CM

## 2013-06-11 DIAGNOSIS — R443 Hallucinations, unspecified: Secondary | ICD-10-CM

## 2013-06-11 DIAGNOSIS — R442 Other hallucinations: Secondary | ICD-10-CM

## 2013-06-11 MED ORDER — RISPERIDONE 0.25 MG PO TABS: 0.2500 mg | ORAL_TABLET | Freq: Every day | ORAL | Status: DC

## 2013-06-11 NOTE — Progress Notes (Signed)
Passed the clock drawing 

## 2013-06-11 NOTE — Progress Notes (Signed)
[name], female   DOB: 20-May-1924, 79 y.o.   MRN: [id number] Location:  [facility] / [place] [facility]   Allergies  Allergen Reactions  . Aspirin   . Codeine   . Iodine     Pt is not aware of this allergy, does not recall much info....//a.c.  . Prednisone   . Sulfa Antibiotics     Chief Complaint  Patient presents with  . Acute Visit    smells something day and night    HPI: Patient is a 79 y.o. black female seen in the office today for acute visit and flu shot.  Smelling things all of the time.  Is not sure the source of the smells.  Fire dept came out twice on Monday (5 days ago) and they could only detect a faint smell but it could not be identified. Nobody else can smell it, but her.  Not only at home, but follows her around.  Comes and goes.  Unable to see anything causing it, thinks she heard something that made the smell--thought someone else there in the yard on the side of the house.  No visitors had been there or people working there that had been invited.  Happening since before she mentioned it--said she didn't have anything concrete so she figured no one would believe her.  As it got worse 2 wks ago, she decided to mention it.      Daughter and son have investigated it.  Faint odor of insecticide before that seemed to be going away.  Has been unable to sleep or rest due to the person outside.  Mostly happening in the evenings.    No new dysuria, frequency, urgency.  No increased cough or sob.    Review of Systems:  Review of Systems  Constitutional: Positive for malaise/fatigue. Negative for fever, chills and weight loss.  HENT:       Smells odor in her home that no one else does  Eyes: Negative for blurred vision.  Respiratory: Negative for shortness of breath.   Cardiovascular: Positive for leg swelling. Negative for chest pain.  Gastrointestinal: Negative for abdominal pain.  Genitourinary: Negative for dysuria.   Musculoskeletal: Negative for falls.  Neurological: Negative for dizziness, focal weakness and weakness.  Endo/Heme/Allergies: Does not bruise/bleed easily.  Psychiatric/Behavioral: Positive for hallucinations.     Past Medical History  Diagnosis Date  . Benign essential hypertension   . Osteoarthritis   . GERD (gastroesophageal reflux disease)   . Seasonal allergies   . Asthma   . Glaucoma   . PVD (peripheral vascular disease)   . Anxiety     Past Surgical History  Procedure Laterality Date  . Abdominal hysterectomy  1981  . Tonsillectomy  1953    Social History:   reports that she has never smoked. She does not have any smokeless tobacco history on file. She reports that she does not drink alcohol or use illicit drugs.  Family History  Problem Relation Age of Onset  . Stroke Father     Medications: Patient's Medications  New Prescriptions   No medications on file  Previous Medications   ALBUTEROL (PROVENTIL HFA;VENTOLIN HFA) 108 (90 BASE) MCG/ACT INHALER    Inhale 2 puffs into the lungs every 6 (six) hours as needed for wheezing.   AMLODIPINE (NORVASC) 5 MG TABLET    Take 5 mg by mouth daily.   BRINZOLAMIDE (AZOPT) 1 % OPHTHALMIC SUSPENSION    Place 1 drop  into both eyes 3 (three) times daily.   CALCIUM CARBONATE-VITAMIN D (CALTRATE 600+D) 600-400 MG-UNIT PER TABLET    Take 2 tablets by mouth daily.   CLORAZEPATE (TRANXENE) 3.75 MG TABLET    Take 1/2 tablet once daily   COENZYME Q10 (COQ10) 50 MG CAPS    Take one tablet by mouth once daily   FLUTICASONE (FLONASE) 50 MCG/ACT NASAL SPRAY    Place 1 spray into the nose daily.   HYOSCYAMINE (ANASPAZ) 0.125 MG TBDP TABLET    Place under the tongue. Take one tablet three times daily   LANSOPRAZOLE (PREVACID) 30 MG CAPSULE    Take 1 capsule (30 mg total) by mouth daily.   LATANOPROST (XALATAN) 0.005 % OPHTHALMIC SOLUTION    Place 1 drop into both eyes at bedtime.   LORATADINE (CLARITIN) 10 MG TABLET    Take 10 mg by mouth  daily.   LOTEPREDNOL (LOTEMAX) 0.5 % OPHTHALMIC SUSPENSION    Place 1 drop into the left eye daily.   MULTIPLE VITAMINS-MINERALS (SENIOR MULTIVITAMIN PLUS) TABS    Take by mouth.   OMEGA-3 FATTY ACIDS (FISH OIL) 1200 MG CPDR    Take one tablet by mouth once daily   QVAR 80 MCG/ACT INHALER    Inhale two puffs twice daily; Rinse gargle and spit; Use with spacer   VITAMIN B-12 (CYANOCOBALAMIN) 1000 MCG TABLET    Take 1,000 mcg by mouth daily.   VITAMIN C (ASCORBIC ACID) 500 MG TABLET    Take 500 mg by mouth daily.  Modified Medications   No medications on file  Discontinued Medications   No medications on file     Physical Exam: Filed Vitals:   06/11/13 0920  BP: 130/78  Pulse: 76  Temp: 97.4 F (36.3 C)  TempSrc: Oral  Resp: 16  Height: 5\' 2"  (1.575 m)  Weight: 141 lb 12.8 oz (64.32 kg)  SpO2: 97%  Physical Exam  Constitutional: She is oriented to person, place, and time. She appears well-developed and well-nourished. No distress.  Cardiovascular: Normal rate, regular rhythm, normal heart sounds and intact distal pulses.   Pulmonary/Chest: Effort normal and breath sounds normal. No respiratory distress.  Abdominal: Soft. Bowel sounds are normal. She exhibits no distension and no mass. There is no tenderness.  Neurological: She is alert and oriented to person, place, and time.  Skin: Skin is warm and dry.  Psychiatric:  Preoccupied with concerns about someone pumping house full of some sort of gas     Past Procedures: MMSE 06/11/13:  29/30 missing one on recall  Assessment/Plan 1. Need for immunization against influenza - Flu Vaccine QUAD 36+ mos IM given  2. Olfactory hallucinations -MMSE quite good at 29/30 -seems she has some degree of depression with psychosis vs. Longstanding psychiatric illness that has not been identified  -will plan to try to treat with risperdal for psychosis -also will add an SSRI at future visit -pt gets very upset when she is not  believed--things everything "thinks she is crazy", but believes she truly is smelling things being pumped into her home -needs geriatric psychiatrist--hopefully, in time, I can convince her to go (perhaps after she is having less of this psychosis with risperdal)  Labs/tests ordered:  MMSE done today Next appt: 1 month

## 2013-06-14 ENCOUNTER — Ambulatory Visit: Payer: Medicare Other | Admitting: Internal Medicine

## 2013-06-24 ENCOUNTER — Encounter: Payer: Self-pay | Admitting: Internal Medicine

## 2013-06-24 ENCOUNTER — Ambulatory Visit (INDEPENDENT_AMBULATORY_CARE_PROVIDER_SITE_OTHER): Payer: Medicare Other | Admitting: Internal Medicine

## 2013-06-24 VITALS — BP 130/74 | HR 72 | Temp 97.7°F | Wt 144.0 lb

## 2013-06-24 DIAGNOSIS — R443 Hallucinations, unspecified: Secondary | ICD-10-CM | POA: Diagnosis not present

## 2013-06-24 DIAGNOSIS — R442 Other hallucinations: Secondary | ICD-10-CM

## 2013-06-24 MED ORDER — RISPERIDONE 0.5 MG PO TABS: 0.5000 mg | ORAL_TABLET | Freq: Every day | ORAL | Status: DC

## 2013-06-24 NOTE — Progress Notes (Signed)
[name], female   DOB: 1923-11-19, 79 y.o.   MRN: [id number] Location:  [facility] / [facility]   Allergies  Allergen Reactions  . Aspirin   . Codeine   . Iodine     Pt is not aware of this allergy, does not recall much info....//a.c.  . Prednisone   . Sulfa Antibiotics     Chief Complaint  Patient presents with  . Medical Managment of Chronic Issues    follow-up, no specific cocnerns     HPI: Patient is a 79 y.o. black female  seen in the office today for medical management of Chronic Issues. Denies any pain in abdomen. States previous issues are resolved. GERD has improved with prevacid. Not really sleeping at night, thinks that it is due to new stressors. Doesn't elaborate on what the stress is being caused by. States that Risperdal is helping some but with these new stressors she hasn't really been able to give this medicine a fair chance. She hasn't taken it faithfully.  States breathing is better with the two medications. Uses albuterol more than twice a week only at night. States that she will start keeping up with how often she is using it.   Agitation has improved with risperdal.  Now says smell was indescribable.  Hasn't seen the man who was after the dog--she says he doesn't have to go outside anymore where she can see him.  Having difficulty sleeping--feels it is worse than before.  The thin masks do not do the trick.  Towel helps at times, but has to take off to breathe.    Is off clorazepate now.  She's had no safety problems at home.  Her daughter checks on her.  She and her brother have been staying over since her last visit.  Review of Systems:  Review of Systems  Constitutional: Negative for weight loss.  HENT: Negative for hearing loss.   Eyes: Negative for blurred vision and double vision.       Has iritis   Respiratory: Negative for shortness of breath.   Cardiovascular: Negative for chest pain.   Gastrointestinal: Negative for diarrhea and constipation.  Genitourinary: Negative for dysuria and urgency.  Musculoskeletal: Negative for falls.  Neurological: Negative for dizziness.  Psychiatric/Behavioral: The patient is nervous/anxious.        Right now mood is terrible but normally it is great     Past Medical History  Diagnosis Date  . Benign essential hypertension   . Osteoarthritis   . GERD (gastroesophageal reflux disease)   . Seasonal allergies   . Asthma   . Glaucoma   . PVD (peripheral vascular disease)   . Anxiety     Past Surgical History  Procedure Laterality Date  . Abdominal hysterectomy  1981  . Tonsillectomy  1953    Social History:   reports that she has never smoked. She does not have any smokeless tobacco history on file. She reports that she does not drink alcohol or use illicit drugs.  Family History  Problem Relation Age of Onset  . Stroke Father     Medications: Patient's Medications  New Prescriptions   No medications on file  Previous Medications   ALBUTEROL (PROVENTIL HFA;VENTOLIN HFA) 108 (90 BASE) MCG/ACT INHALER    Inhale 2 puffs into the lungs every 6 (six) hours as needed for wheezing.   AMLODIPINE (NORVASC) 5 MG TABLET    Take 5 mg by mouth daily.  BRINZOLAMIDE (AZOPT) 1 % OPHTHALMIC SUSPENSION    Place 1 drop into both eyes 3 (three) times daily.   CALCIUM CARBONATE-VITAMIN D (CALTRATE 600+D) 600-400 MG-UNIT PER TABLET    Take 2 tablets by mouth daily.   COENZYME Q10 (COQ10) 50 MG CAPS    Take one tablet by mouth once daily   FLUTICASONE (FLONASE) 50 MCG/ACT NASAL SPRAY    Place 1 spray into the nose daily.   LANSOPRAZOLE (PREVACID) 30 MG CAPSULE    Take 1 capsule (30 mg total) by mouth daily.   LATANOPROST (XALATAN) 0.005 % OPHTHALMIC SOLUTION    Place 1 drop into both eyes at bedtime.   LORATADINE (CLARITIN) 10 MG TABLET    Take 10 mg by mouth daily.   LOTEPREDNOL (LOTEMAX) 0.5 % OPHTHALMIC SUSPENSION    Place 1 drop into the  left eye daily.   MULTIPLE VITAMINS-MINERALS (SENIOR MULTIVITAMIN PLUS) TABS    Take by mouth.   OMEGA-3 FATTY ACIDS (FISH OIL) 1200 MG CPDR    Take one tablet by mouth once daily   QVAR 80 MCG/ACT INHALER    Inhale two puffs twice daily; Rinse gargle and spit; Use with spacer   RISPERIDONE (RISPERDAL) 0.25 MG TABLET    Take 1 tablet (0.25 mg total) by mouth at bedtime.   VITAMIN B-12 (CYANOCOBALAMIN) 1000 MCG TABLET    Take 1,000 mcg by mouth daily.   VITAMIN C (ASCORBIC ACID) 500 MG TABLET    Take 500 mg by mouth daily.  Modified Medications   No medications on file  Discontinued Medications   CLORAZEPATE (TRANXENE) 3.75 MG TABLET    Take 1/2 tablet once daily   Physical Exam: Filed Vitals:   06/24/13 1432  BP: 130/74  Pulse: 72  Temp: 97.7 F (36.5 C)  TempSrc: Oral  Weight: 144 lb (65.318 kg)   Physical Exam  Constitutional: She appears well-developed.  Cardiovascular: Normal rate, regular rhythm, normal heart sounds and intact distal pulses.   Pulmonary/Chest: Effort normal and breath sounds normal.  Neurological: She is alert.  Skin: Skin is warm and dry.  Psychiatric:  Appears sad   Assessment/Plan 1. Olfactory hallucinations -advised she increase the risperdal to 0.5mg  po qhs from 0.25mg ;  May go up to 1mg  if not better in the next week  -if she is unwilling to take a higher dose, this will not improve--I explained this to her daughter who seemed to have convinced her to try it to treat her insomnia due to her concerns about this individual in the house and the smell - risperiDONE (RISPERDAL) 0.5 MG tablet; Take 1 tablet (0.5 mg total) by mouth at bedtime.  Dispense: 30 tablet; Refill: 3   Next appt:  As scheduled in October

## 2013-06-24 NOTE — Patient Instructions (Addendum)
Would increase risperdal. If no improvement in 1 week, may increase again to 1mg  at bedtime.

## 2013-07-09 ENCOUNTER — Encounter: Payer: Self-pay | Admitting: Internal Medicine

## 2013-07-09 ENCOUNTER — Ambulatory Visit (INDEPENDENT_AMBULATORY_CARE_PROVIDER_SITE_OTHER): Payer: Medicare Other | Admitting: Internal Medicine

## 2013-07-09 VITALS — BP 138/90 | HR 67 | Temp 97.8°F | Wt 145.0 lb

## 2013-07-09 DIAGNOSIS — I10 Essential (primary) hypertension: Secondary | ICD-10-CM | POA: Diagnosis not present

## 2013-07-09 DIAGNOSIS — R443 Hallucinations, unspecified: Secondary | ICD-10-CM

## 2013-07-09 DIAGNOSIS — J45909 Unspecified asthma, uncomplicated: Secondary | ICD-10-CM | POA: Diagnosis not present

## 2013-07-09 DIAGNOSIS — R442 Other hallucinations: Secondary | ICD-10-CM

## 2013-07-09 DIAGNOSIS — K219 Gastro-esophageal reflux disease without esophagitis: Secondary | ICD-10-CM

## 2013-07-09 NOTE — Progress Notes (Signed)
[name], female   DOB: 27-Mar-1924, 79 y.o.   MRN: [id number] Location:  [facility]  Allergies  Allergen Reactions  . Aspirin   . Codeine   . Iodine     Pt is not aware of this allergy, does not recall much info....//a.c.  . Prednisone   . Sulfa Antibiotics     Chief Complaint  Patient presents with  . Medical Managment of Chronic Issues    6 week follow-up, no specific concerns     HPI: Patient is a 79 y.o. black female seen in the office today for medical management of chronic diseases.  Pt. States that she did not increase Risperdal but states that the smell is still there but it is getting better. Pt. Denies visual hallucinations, states that everybody keeps telling her she is imagining things but she said it is real.  States that everything is either tapering off or she is adjusting better. Said everything is going really well right now.  Pt. States she ate a hot dog because she was craving it but it was too salty and elevated her pressure some and made her ankles swell but that has resolved.   GERD- controlled prevacid States that asthma has been controlled. Uses inhaler three times (she thinks) a week to keep it controlled. States that she saw her daughter using hers more so she thought she would try to use hers more.  Review of Systems:  Review of Systems  Constitutional: Negative for fever, chills, weight loss and malaise/fatigue.  Respiratory: Negative for shortness of breath.   Cardiovascular: Negative for chest pain.  Gastrointestinal: Negative for heartburn, nausea, vomiting, diarrhea and constipation.  Musculoskeletal: Negative for falls.  Neurological: Negative for dizziness.  Psychiatric/Behavioral: Negative for depression. The patient is not nervous/anxious.        Thinks her mood is much better     Past Medical History  Diagnosis Date  . Benign essential hypertension   . Osteoarthritis   . GERD  (gastroesophageal reflux disease)   . Seasonal allergies   . Asthma   . Glaucoma   . PVD (peripheral vascular disease)   . Anxiety     Past Surgical History  Procedure Laterality Date  . Abdominal hysterectomy  1981  . Tonsillectomy  1953    Social History:   reports that she has never smoked. She does not have any smokeless tobacco history on file. She reports that she does not drink alcohol or use illicit drugs.  Family History  Problem Relation Age of Onset  . Stroke Father     Medications: Patient's Medications  New Prescriptions   No medications on file  Previous Medications   ALBUTEROL (PROVENTIL HFA;VENTOLIN HFA) 108 (90 BASE) MCG/ACT INHALER    Inhale 2 puffs into the lungs every 6 (six) hours as needed for wheezing.   AMLODIPINE (NORVASC) 5 MG TABLET    Take 5 mg by mouth daily.   BRINZOLAMIDE (AZOPT) 1 % OPHTHALMIC SUSPENSION    Place 1 drop into both eyes 3 (three) times daily.   CALCIUM CARBONATE-VITAMIN D (CALTRATE 600+D) 600-400 MG-UNIT PER TABLET    Take 2 tablets by mouth daily.   COENZYME Q10 (COQ10) 50 MG CAPS    Take one tablet by mouth once daily   FLUTICASONE (FLONASE) 50 MCG/ACT NASAL SPRAY    Place 1 spray into the nose daily.   LANSOPRAZOLE (PREVACID) 30 MG CAPSULE    Take 1  capsule (30 mg total) by mouth daily.   LATANOPROST (XALATAN) 0.005 % OPHTHALMIC SOLUTION    Place 1 drop into both eyes at bedtime.   LORATADINE (CLARITIN) 10 MG TABLET    Take 10 mg by mouth daily.   LOTEPREDNOL (LOTEMAX) 0.5 % OPHTHALMIC SUSPENSION    Place 1 drop into the left eye daily.   MULTIPLE VITAMINS-MINERALS (SENIOR MULTIVITAMIN PLUS) TABS    Take by mouth.   OMEGA-3 FATTY ACIDS (FISH OIL) 1200 MG CPDR    Take one tablet by mouth once daily   QVAR 80 MCG/ACT INHALER    Inhale two puffs twice daily; Rinse gargle and spit; Use with spacer   RISPERIDONE (RISPERDAL) 0.5 MG TABLET    Take 1 tablet (0.5 mg total) by mouth at bedtime.   VITAMIN B-12 (CYANOCOBALAMIN) 1000 MCG  TABLET    Take 1,000 mcg by mouth daily.   VITAMIN C (ASCORBIC ACID) 500 MG TABLET    Take 500 mg by mouth daily.  Modified Medications   No medications on file  Discontinued Medications   No medications on file     Physical Exam: Filed Vitals:   07/09/13 1037  BP: 138/90  Pulse: 67  Temp: 97.8 F (36.6 C)  TempSrc: Oral  Weight: 145 lb (65.772 kg)  SpO2: 96%   Physical Exam  Constitutional: She is oriented to person, place, and time. She appears well-developed and well-nourished.  Cardiovascular: Normal rate, regular rhythm, normal heart sounds and intact distal pulses.   Pulmonary/Chest: Effort normal and breath sounds normal.  Abdominal: Soft. Bowel sounds are normal.  Musculoskeletal: She exhibits edema.  Bilateral LE  Neurological: She is alert and oriented to person, place, and time.  Skin: Skin is warm and dry.  Psychiatric: She has a normal mood and affect.   Assessment/Plan 1. Olfactory hallucinations -I suspect there is no improvement, but pt gets agitated when asked if she is still smelling the smoke smell that no one else smells--was here by herself today -refused to take more risperdal to help with this  2. GERD (gastroesophageal reflux disease) -currently well controlled  3. Benign essential hypertension -bp at goal--no changes needed  4. Asthma -cont albuterol as needed  Labs/tests ordered:  Cbc, cmp, lipids before regular visit Next appt:  6 wks

## 2013-07-27 DIAGNOSIS — J45909 Unspecified asthma, uncomplicated: Secondary | ICD-10-CM | POA: Diagnosis not present

## 2013-07-27 DIAGNOSIS — J3089 Other allergic rhinitis: Secondary | ICD-10-CM | POA: Diagnosis not present

## 2013-07-28 DIAGNOSIS — M47812 Spondylosis without myelopathy or radiculopathy, cervical region: Secondary | ICD-10-CM | POA: Diagnosis not present

## 2013-07-29 DIAGNOSIS — H4011X0 Primary open-angle glaucoma, stage unspecified: Secondary | ICD-10-CM | POA: Diagnosis not present

## 2013-08-17 ENCOUNTER — Other Ambulatory Visit: Payer: Medicare Other

## 2013-08-17 DIAGNOSIS — E663 Overweight: Secondary | ICD-10-CM

## 2013-08-17 DIAGNOSIS — K449 Diaphragmatic hernia without obstruction or gangrene: Secondary | ICD-10-CM | POA: Diagnosis not present

## 2013-08-17 DIAGNOSIS — K5730 Diverticulosis of large intestine without perforation or abscess without bleeding: Secondary | ICD-10-CM | POA: Diagnosis not present

## 2013-08-17 DIAGNOSIS — R1032 Left lower quadrant pain: Secondary | ICD-10-CM | POA: Diagnosis not present

## 2013-08-18 LAB — CBC WITH DIFFERENTIAL/PLATELET
Basophils Absolute: 0 10*3/uL (ref 0.0–0.2)
Basos: 0 %
Eos: 2 %
Eosinophils Absolute: 0.2 10*3/uL (ref 0.0–0.4)
HCT: 36.6 % (ref 34.0–46.6)
Hemoglobin: 12.3 g/dL (ref 11.1–15.9)
Immature Grans (Abs): 0 10*3/uL (ref 0.0–0.1)
Immature Granulocytes: 0 %
Lymphocytes Absolute: 3.5 10*3/uL — ABNORMAL HIGH (ref 0.7–3.1)
Lymphs: 44 %
MCH: 31.6 pg (ref 26.6–33.0)
MCHC: 33.6 g/dL (ref 31.5–35.7)
MCV: 94 fL (ref 79–97)
Monocytes Absolute: 0.7 10*3/uL (ref 0.1–0.9)
Monocytes: 9 %
Neutrophils Absolute: 3.6 10*3/uL (ref 1.4–7.0)
Neutrophils Relative %: 45 %
RBC: 3.89 x10E6/uL (ref 3.77–5.28)
RDW: 14 % (ref 12.3–15.4)
WBC: 8.1 10*3/uL (ref 3.4–10.8)

## 2013-08-18 LAB — COMPREHENSIVE METABOLIC PANEL
ALT: 17 IU/L (ref 0–32)
AST: 20 IU/L (ref 0–40)
Albumin/Globulin Ratio: 1.5 (ref 1.1–2.5)
Albumin: 4.1 g/dL (ref 3.5–4.7)
Alkaline Phosphatase: 82 IU/L (ref 39–117)
BUN/Creatinine Ratio: 21 (ref 11–26)
BUN: 17 mg/dL (ref 8–27)
CO2: 24 mmol/L (ref 18–29)
Calcium: 9.6 mg/dL (ref 8.6–10.2)
Chloride: 104 mmol/L (ref 97–108)
Creatinine, Ser: 0.82 mg/dL (ref 0.57–1.00)
GFR calc Af Amer: 75 mL/min/{1.73_m2} (ref >59)
GFR calc non Af Amer: 65 mL/min/{1.73_m2} (ref >59)
Globulin, Total: 2.7 g/dL (ref 1.5–4.5)
Glucose: 110 mg/dL — ABNORMAL HIGH (ref 65–99)
Potassium: 4 mmol/L (ref 3.5–5.2)
Sodium: 142 mmol/L (ref 134–144)
Total Bilirubin: 0.5 mg/dL (ref 0.0–1.2)
Total Protein: 6.8 g/dL (ref 6.0–8.5)

## 2013-08-18 LAB — LIPID PANEL
Chol/HDL Ratio: 1.8 ratio units (ref 0.0–4.4)
Cholesterol, Total: 187 mg/dL (ref 100–199)
HDL: 105 mg/dL (ref >39)
LDL Calculated: 73 mg/dL (ref 0–99)
Triglycerides: 47 mg/dL (ref 0–149)
VLDL Cholesterol Cal: 9 mg/dL (ref 5–40)

## 2013-08-20 ENCOUNTER — Ambulatory Visit (INDEPENDENT_AMBULATORY_CARE_PROVIDER_SITE_OTHER): Payer: Medicare Other | Admitting: Internal Medicine

## 2013-08-20 ENCOUNTER — Encounter: Payer: Self-pay | Admitting: Internal Medicine

## 2013-08-20 VITALS — BP 124/70 | HR 70 | Temp 97.8°F | Resp 14 | Wt 143.8 lb

## 2013-08-20 DIAGNOSIS — M25471 Effusion, right ankle: Secondary | ICD-10-CM

## 2013-08-20 DIAGNOSIS — R0609 Other forms of dyspnea: Secondary | ICD-10-CM

## 2013-08-20 DIAGNOSIS — R442 Other hallucinations: Secondary | ICD-10-CM

## 2013-08-20 DIAGNOSIS — R443 Hallucinations, unspecified: Secondary | ICD-10-CM

## 2013-08-20 DIAGNOSIS — M25473 Effusion, unspecified ankle: Secondary | ICD-10-CM

## 2013-08-20 DIAGNOSIS — F22 Delusional disorders: Secondary | ICD-10-CM | POA: Diagnosis not present

## 2013-08-20 DIAGNOSIS — R0600 Dyspnea, unspecified: Secondary | ICD-10-CM

## 2013-08-20 DIAGNOSIS — R0989 Other specified symptoms and signs involving the circulatory and respiratory systems: Secondary | ICD-10-CM | POA: Diagnosis not present

## 2013-08-20 MED ORDER — SERTRALINE HCL 25 MG PO TABS: 25.0000 mg | ORAL_TABLET | Freq: Every day | ORAL | Status: DC

## 2013-08-20 NOTE — Progress Notes (Signed)
[name], female   DOB: 1924-08-25, 79 y.o.   MRN: [id number] Location:  [facility] / [facility]  Code Status: DNR   Allergies  Allergen Reactions  . Aspirin   . Codeine   . Iodine     Pt is not aware of this allergy, does not recall much info....//a.c.  . Prednisone   . Sulfa Antibiotics     Chief Complaint  Patient presents with  . Follow-up    6 week f/u labs printed  . Joint Swelling    ankle RT side more than LT x 2 weeks  . Immunizations    Tdap < 10 yrs  . other    SOB (all the time) x 2 weeks    HPI: Patient is a 79 y.o. black female seen in the office today for 6 week follow up, right ankle swelling and dyspnea x 2 weeks.    Saw Dr. Madie [name] when shortness of breath started.  Mentions again about breathing and smelling something others don't.  Smelled like a different kind of gas smell in house.  Was in a space of her house and then cleared up.  [name] [name] about it.  Formula changed she says and odor was different.  Last 2 wks, increased the forces that come in.  Substance last week has been dry dust she feels going to her lungs.  Says inhalers and nasal spray are protecting her.    Can't cook in kitchen like she did and grabs what she can.  Sleeps where she can.  Slept in recliner one night, and it was the next morning that it was swollen.  No injury to ankle.  Right leg was also swollen.  In 2007, did have a fall on the right hip.    Is eating more food with preservatives.  Fumes are interfering with her cooking in the kitchen.  Happening 24x7 now.  Doesn't know how long she can stand it.  Says they tortured her dog and now they are torturing her.    Last night slept w/o the heat on b/c she thinks the fumes come through there.  Has not turned on gas heater for the winter yet.  Bundled up in layers of clothes today.  Has stopped taking the risperdal after [name] [name] stopped staying with her after the first appointment.  She is  concerned she'll sleep too deeply when she takes it and something will happen to her.  Is quite paranoid--concerned for her life.    Review of Systems:  Review of Systems  Constitutional: Negative for fever and chills.  HENT: Negative for congestion.   Respiratory: Positive for shortness of breath. Negative for cough, sputum production and wheezing.   Cardiovascular: Negative for chest pain.  Gastrointestinal: Negative for heartburn and abdominal pain.  Genitourinary: Negative for dysuria.  Musculoskeletal: Negative for falls.  Skin: Negative for rash.  Neurological: Negative for dizziness and focal weakness.  Psychiatric/Behavioral: Positive for depression and hallucinations. Negative for memory loss. The patient is nervous/anxious and has insomnia.        Feels deserted to some degree b/c people backed away when they didn't believe her about the fumes    Past Medical History  Diagnosis Date  . Benign essential hypertension   . Osteoarthritis   . GERD (gastroesophageal reflux disease)   . Seasonal allergies   . Asthma   . Glaucoma   . PVD (peripheral vascular disease)   . Anxiety  Past Surgical History  Procedure Laterality Date  . Abdominal hysterectomy  1981  . Tonsillectomy  1953    Social History:   reports that she has never smoked. She does not have any smokeless tobacco history on file. She reports that she does not drink alcohol or use illicit drugs.  Family History  Problem Relation Age of Onset  . Stroke Father     Medications: Patient's Medications  New Prescriptions   No medications on file  Previous Medications   ALBUTEROL (PROVENTIL HFA;VENTOLIN HFA) 108 (90 BASE) MCG/ACT INHALER    Inhale 2 puffs into the lungs every 6 (six) hours as needed for wheezing.   AMLODIPINE (NORVASC) 5 MG TABLET    Take 5 mg by mouth daily.   BRINZOLAMIDE (AZOPT) 1 % OPHTHALMIC SUSPENSION    Place 1 drop into both eyes 3 (three) times daily.   CALCIUM CARBONATE-VITAMIN D  (CALTRATE 600+D) 600-400 MG-UNIT PER TABLET    Take 2 tablets by mouth daily.   COENZYME Q10 (COQ10) 50 MG CAPS    Take one tablet by mouth once daily   FLUTICASONE (FLONASE) 50 MCG/ACT NASAL SPRAY    Place 1 spray into the nose daily.   LANSOPRAZOLE (PREVACID) 30 MG CAPSULE    Take 1 capsule (30 mg total) by mouth daily.   LATANOPROST (XALATAN) 0.005 % OPHTHALMIC SOLUTION    Place 1 drop into both eyes at bedtime.   LORATADINE (CLARITIN) 10 MG TABLET    Take 10 mg by mouth daily.   LOTEPREDNOL (LOTEMAX) 0.5 % OPHTHALMIC SUSPENSION    Place 1 drop into the left eye daily.   MULTIPLE VITAMINS-MINERALS (SENIOR MULTIVITAMIN PLUS) TABS    Take by mouth.   OMEGA-3 FATTY ACIDS (FISH OIL) 1200 MG CPDR    Take one tablet by mouth once daily   QVAR 80 MCG/ACT INHALER    Inhale two puffs twice daily; Rinse gargle and spit; Use with spacer   RISPERIDONE (RISPERDAL) 0.5 MG TABLET    Take 1 tablet (0.5 mg total) by mouth at bedtime.   VITAMIN B-12 (CYANOCOBALAMIN) 1000 MCG TABLET    Take 1,000 mcg by mouth daily.   VITAMIN C (ASCORBIC ACID) 500 MG TABLET    Take 500 mg by mouth daily.  Modified Medications   No medications on file  Discontinued Medications   No medications on file   Physical Exam: Filed Vitals:   08/20/13 1042  BP: 124/70  Pulse: 70  Temp: 97.8 F (36.6 C)  TempSrc: Oral  Resp: 14  Weight: 143 lb 12.8 oz (65.227 kg)  SpO2: 99%  Physical Exam  Constitutional: She is oriented to person, place, and time. She appears well-developed and well-nourished. No distress.  Cardiovascular: Normal rate, regular rhythm, normal heart sounds and intact distal pulses.   edema  Pulmonary/Chest: Effort normal and breath sounds normal. No respiratory distress. She has no rales.  Abdominal: Soft. Bowel sounds are normal. She exhibits no distension. There is no tenderness.  Musculoskeletal: Normal range of motion. She exhibits edema. She exhibits no tenderness.  Right leg with 1+ pitting edema to  mid shin  Neurological: She is alert and oriented to person, place, and time.  Focused on olfactory delusion/hallucination  Skin: Skin is warm and dry.    Labs reviewed: Basic Metabolic Panel:  Recent Labs  16/10/96 0836  NA 142  K 4.0  CL 104  CO2 24  GLUCOSE 110*  BUN 17  CREATININE 0.82  CALCIUM 9.6  Liver Function Tests:  Recent Labs  08/17/13 0836  AST 20  ALT 17  ALKPHOS 82  BILITOT 0.5  PROT 6.8  CBC:  Recent Labs  08/17/13 0836  WBC 8.1  NEUTROABS 3.6  HGB 12.3  HCT 36.6  MCV 94   Lipid Panel:  Recent Labs  08/17/13 0836  HDL 105  LDLCALC 73  TRIG 47  CHOLHDL 1.8   Assessment/Plan 1. Right ankle swelling -due to increased sodium intake when not cooking anymore b/c of her olfactory delusions  2. Olfactory hallucinations -persist, much worse again when not taking risperdal -will not take the medication and refuses depression treatment  3. Dyspnea -pt attributes to fumes in her home that cannot be identified -suspect she may have some mild CHF and now exacerbated mildly by increased sodium--advised to avoid  4. Paranoid type delusional disorder -says she cannot name those that are responsible for the fumes and torturing her dogs -this is now interfering with her safety and even a healthy diet  -needs to see a psychiatrist b/c I cannot convince her to take these meds--antidepressants or antipsychotics--I have been trying for several visits to no avail -discussed independent living facility, but she thinks they will follow her there  Labs/tests ordered: none-have bene normal Next appt:  3 mos

## 2013-08-20 NOTE — Patient Instructions (Signed)
Will start on zoloft--did agree to this.

## 2013-08-20 NOTE — Addendum Note (Signed)
Addended by: Bufford Spikes L on: 08/20/2013 11:45 AM   Modules accepted: Orders

## 2013-09-10 DIAGNOSIS — R609 Edema, unspecified: Secondary | ICD-10-CM | POA: Diagnosis not present

## 2013-10-04 ENCOUNTER — Ambulatory Visit (INDEPENDENT_AMBULATORY_CARE_PROVIDER_SITE_OTHER): Payer: Medicare Other | Admitting: Internal Medicine

## 2013-10-04 ENCOUNTER — Encounter: Payer: Self-pay | Admitting: Internal Medicine

## 2013-10-04 VITALS — BP 122/74 | HR 66 | Temp 96.9°F | Resp 12 | Wt 142.0 lb

## 2013-10-04 DIAGNOSIS — R0989 Other specified symptoms and signs involving the circulatory and respiratory systems: Secondary | ICD-10-CM

## 2013-10-04 DIAGNOSIS — G609 Hereditary and idiopathic neuropathy, unspecified: Secondary | ICD-10-CM | POA: Diagnosis not present

## 2013-10-04 DIAGNOSIS — R443 Hallucinations, unspecified: Secondary | ICD-10-CM

## 2013-10-04 DIAGNOSIS — R442 Other hallucinations: Secondary | ICD-10-CM

## 2013-10-04 DIAGNOSIS — R0609 Other forms of dyspnea: Secondary | ICD-10-CM

## 2013-10-04 DIAGNOSIS — R609 Edema, unspecified: Secondary | ICD-10-CM

## 2013-10-04 DIAGNOSIS — Z23 Encounter for immunization: Secondary | ICD-10-CM | POA: Diagnosis not present

## 2013-10-04 DIAGNOSIS — G5793 Unspecified mononeuropathy of bilateral lower limbs: Secondary | ICD-10-CM

## 2013-10-04 DIAGNOSIS — R0600 Dyspnea, unspecified: Secondary | ICD-10-CM

## 2013-10-04 DIAGNOSIS — F22 Delusional disorders: Secondary | ICD-10-CM

## 2013-10-04 MED ORDER — RISPERIDONE 0.5 MG PO TABS: 0.5000 mg | ORAL_TABLET | Freq: Two times a day (BID) | ORAL | Status: DC

## 2013-10-04 MED ORDER — TETANUS-DIPHTH-ACELL PERTUSSIS 5-2-15.5 LF-MCG/0.5 IM SUSP: 0.5000 mL | Freq: Once | INTRAMUSCULAR | Status: DC

## 2013-10-04 NOTE — Progress Notes (Signed)
[name], female   DOB: September 17, 1924, 79 y.o.   MRN: [id number]   Location:  [facility] / [facility]   Allergies  Allergen Reactions  . Aspirin   . Codeine   . Iodine     Pt is not aware of this allergy, does not recall much info....//a.c.  . Prednisone   . Sulfa Antibiotics     Chief Complaint  Patient presents with  . Medical Managment of Chronic Issues    1 month follow, ongoing swelling (worse since last OV)     HPI: Patient is a 79 y.o.  seen in the office today for 1 month f/u.   Had big party for her bday over the weekend.   Right ankle had been slightly swollen last visit.  Now both are swollen.  Using compression hose.  Sat they were bad.  She is a little more short of breath.  Gases and fumes she smells in house affect it.  Gets dizzy from the gases in the house.  Says the guy is putting something in the car now also.  Feels dry dust into her throat and into her chest.  Sometimes causes chest pain.  Feels like it settles at the middle of her stomach.  Also had discomfort under her left shoulder blade.     Review of Systems:  Review of Systems  Constitutional: Negative for fever, chills, weight loss and malaise/fatigue.  Eyes: Negative for blurred vision.  Respiratory: Positive for shortness of breath. Negative for cough.   Cardiovascular: Positive for leg swelling. Negative for chest pain.  Gastrointestinal: Negative for abdominal pain, diarrhea and constipation.  Genitourinary: Negative for dysuria, urgency, frequency and flank pain.  Musculoskeletal: Positive for back pain. Negative for falls and myalgias.  Skin: Negative for rash.  Neurological: Positive for dizziness and sensory change. Negative for weakness and headaches.  Psychiatric/Behavioral: Positive for hallucinations. The patient does not have insomnia.      Past Medical History  Diagnosis Date  . Benign essential hypertension   . Osteoarthritis   . GERD  (gastroesophageal reflux disease)   . Seasonal allergies   . Asthma   . Glaucoma   . PVD (peripheral vascular disease)   . Anxiety     Past Surgical History  Procedure Laterality Date  . Abdominal hysterectomy  1981  . Tonsillectomy  1953    Social History:   reports that she has never smoked. She does not have any smokeless tobacco history on file. She reports that she does not drink alcohol or use illicit drugs.  Family History  Problem Relation Age of Onset  . Stroke Father     Medications: Patient's Medications  New Prescriptions   No medications on file  Previous Medications   ALBUTEROL (PROVENTIL HFA;VENTOLIN HFA) 108 (90 BASE) MCG/ACT INHALER    Inhale 2 puffs into the lungs every 6 (six) hours as needed for wheezing.   AMLODIPINE (NORVASC) 5 MG TABLET    Take 5 mg by mouth daily.   BRINZOLAMIDE (AZOPT) 1 % OPHTHALMIC SUSPENSION    Place 1 drop into both eyes 3 (three) times daily.   CALCIUM CARBONATE-VITAMIN D (CALTRATE 600+D) 600-400 MG-UNIT PER TABLET    Take 2 tablets by mouth daily.   COENZYME Q10 (COQ10) 50 MG CAPS    Take one tablet by mouth once daily   FLUTICASONE (FLONASE) 50 MCG/ACT NASAL SPRAY    Place 1 spray into the nose daily.  FUROSEMIDE (LASIX) 20 MG TABLET    Take 20 mg by mouth daily.    LANSOPRAZOLE (PREVACID) 30 MG CAPSULE    Take 1 capsule (30 mg total) by mouth daily.   LATANOPROST (XALATAN) 0.005 % OPHTHALMIC SOLUTION    Place 1 drop into both eyes at bedtime.   LORATADINE (CLARITIN) 10 MG TABLET    Take 10 mg by mouth daily.   LOTEPREDNOL (LOTEMAX) 0.5 % OPHTHALMIC SUSPENSION    Place 1 drop into the left eye daily.   MULTIPLE VITAMINS-MINERALS (SENIOR MULTIVITAMIN PLUS) TABS    Take by mouth.   OMEGA-3 FATTY ACIDS (FISH OIL) 1200 MG CPDR    Take one tablet by mouth once daily   QVAR 80 MCG/ACT INHALER    Inhale two puffs twice daily; Rinse gargle and spit; Use with spacer   RISPERIDONE (RISPERDAL) 0.5 MG TABLET    Take 1 tablet (0.5 mg  total) by mouth at bedtime.   SERTRALINE (ZOLOFT) 25 MG TABLET    Take 1 tablet (25 mg total) by mouth daily.   TDAP (ADACEL) 02-05-14.5 LF-MCG/0.5 INJECTION    Inject 0.5 mLs into the muscle once.   VITAMIN B-12 (CYANOCOBALAMIN) 1000 MCG TABLET    Take 1,000 mcg by mouth daily.   VITAMIN C (ASCORBIC ACID) 500 MG TABLET    Take 500 mg by mouth daily.  Modified Medications   No medications on file  Discontinued Medications   No medications on file     Physical Exam: Filed Vitals:   10/04/13 1055  BP: 122/74  Pulse: 66  Temp: 96.9 F (36.1 C)  TempSrc: Oral  Resp: 12  Weight: 142 lb (64.411 kg)  SpO2: 99%  Physical Exam  Constitutional: She is oriented to person, place, and time. She appears well-developed and well-nourished. No distress.  Cardiovascular: Normal rate, regular rhythm, normal heart sounds and intact distal pulses.   2+ pitting edema bilaterally, wearing compression hose  Pulmonary/Chest: Effort normal and breath sounds normal. No respiratory distress. She has no rales.  Abdominal: Soft. Bowel sounds are normal. She exhibits no distension. There is no tenderness.  Musculoskeletal: Normal range of motion. She exhibits no tenderness.  Neurological: She is alert and oriented to person, place, and time.  Skin: Skin is warm and dry.  Psychiatric: She has a normal mood and affect.  Talks about smelling gases in her home that no one else smells     Labs reviewed: Basic Metabolic Panel:  Recent Labs  16/10/96 0836  NA 142  K 4.0  CL 104  CO2 24  GLUCOSE 110*  BUN 17  CREATININE 0.82  CALCIUM 9.6   Liver Function Tests:  Recent Labs  08/17/13 0836  AST 20  ALT 17  ALKPHOS 82  BILITOT 0.5  PROT 6.8  CBC:  Recent Labs  08/17/13 0836  WBC 8.1  NEUTROABS 3.6  HGB 12.3  HCT 36.6  MCV 94   Lipid Panel:  Recent Labs  08/17/13 0836  HDL 105  LDLCALC 73  TRIG 47  CHOLHDL 1.8   Past Procedures: EKG:  Sinus with 1st degree AV block, left  atrial abnormality, T abn in anteroseptal leads, rate 65, no acute ischemia or infarct  Assessment/Plan 1. Neuropathy of both feet -not diabetic, but last few fasting glucoses have been slightly elevated--will monitor due to fh/o dm - check B12 and Folate Panel to rule this out as cause of neuropathy   2. Olfactory hallucinations - persist, but her daughter  notes she is dealing with them better since she as been taking the risperdal daily - risperiDONE (RISPERDAL) 0.5 MG tablet; Take 1 tablet (0.5 mg total) by mouth 2 (two) times daily.  Dispense: 60 tablet; Refill: 3  3. Edema -pitting--continue compression hose, sodium restriction, elevating legs at rest - EKG 12-Lead was not remarkable for acute ischemia or infarct - Ambulatory referral to Cardiology for echocardiogram due to dyspnea and edema  4. Need for prophylactic vaccination with combined diphtheria-tetanus-pertussis (DTP) vaccine - script given for tdap Tdap (ADACEL) 02-05-14.5 LF-MCG/0.5 injection; Inject 0.5 mLs into the muscle once.  Dispense: 0.5 mL; Refill: 0  5. Paranoid type delusional disorder -pt denies having any type of psychiatric condition--has olfactory hallucinations with some response to low dose risperdal at bedtime (is now sleeping at night) so added a morning dose as well--hopefully, she will take it -she is not taking the zoloft I prescribed her last time  6. Dyspnea -new since last visit--? Cardiac -renal and liver functions have been normal on labs -will check echo to evaluate for CHF  Labs/tests ordered:   Orders Placed This Encounter  Procedures  . [facility] MAMMOGRAPHY    This external order was created through the Results Console.  . B12 and Folate Panel  . Ambulatory referral to Cardiology    Referral Priority:  Routine    Referral Type:  Consultation    Referral Reason:  Specialty Services Required    Requested Specialty:  Cardiology    Number of Visits Requested:  1  . EKG 12-Lead   Next appt:   1 mo to f/u on edema, sob, and olfactory hallucinations

## 2013-10-05 LAB — B12 AND FOLATE PANEL
Folate: >19.9 ng/mL (ref >3.0)
Vitamin B-12: 1690 pg/mL — ABNORMAL HIGH (ref 211–946)

## 2013-10-15 ENCOUNTER — Ambulatory Visit: Payer: Medicare Other | Admitting: Cardiology

## 2013-10-19 DIAGNOSIS — J3089 Other allergic rhinitis: Secondary | ICD-10-CM | POA: Diagnosis not present

## 2013-10-19 DIAGNOSIS — J45909 Unspecified asthma, uncomplicated: Secondary | ICD-10-CM | POA: Diagnosis not present

## 2013-10-22 DIAGNOSIS — M19049 Primary osteoarthritis, unspecified hand: Secondary | ICD-10-CM | POA: Diagnosis not present

## 2013-10-26 ENCOUNTER — Encounter: Payer: Self-pay | Admitting: Cardiology

## 2013-10-26 ENCOUNTER — Ambulatory Visit (INDEPENDENT_AMBULATORY_CARE_PROVIDER_SITE_OTHER): Payer: Medicare Other | Admitting: Cardiology

## 2013-10-26 ENCOUNTER — Encounter: Payer: Self-pay | Admitting: General Surgery

## 2013-10-26 ENCOUNTER — Telehealth: Payer: Self-pay | Admitting: Cardiology

## 2013-10-26 VITALS — BP 126/56 | HR 70 | Ht 64.0 in | Wt 140.0 lb

## 2013-10-26 DIAGNOSIS — R0602 Shortness of breath: Secondary | ICD-10-CM | POA: Diagnosis not present

## 2013-10-26 DIAGNOSIS — R600 Localized edema: Secondary | ICD-10-CM | POA: Insufficient documentation

## 2013-10-26 DIAGNOSIS — I10 Essential (primary) hypertension: Secondary | ICD-10-CM | POA: Diagnosis not present

## 2013-10-26 DIAGNOSIS — R609 Edema, unspecified: Secondary | ICD-10-CM | POA: Diagnosis not present

## 2013-10-26 DIAGNOSIS — Z79899 Other long term (current) drug therapy: Secondary | ICD-10-CM

## 2013-10-26 LAB — BRAIN NATRIURETIC PEPTIDE: Pro B Natriuretic peptide (BNP): 57 pg/mL (ref 0.0–100.0)

## 2013-10-26 LAB — BASIC METABOLIC PANEL
BUN: 16 mg/dL (ref 6–23)
CALCIUM: 9.5 mg/dL (ref 8.4–10.5)
CO2: 29 meq/L (ref 19–32)
CREATININE: 0.9 mg/dL (ref 0.4–1.2)
Chloride: 104 mEq/L (ref 96–112)
GFR: 74.25 mL/min (ref >60.00)
GLUCOSE: 107 mg/dL — AB (ref 70–99)
Potassium: 3.4 mEq/L — ABNORMAL LOW (ref 3.5–5.1)
Sodium: 140 mEq/L (ref 135–145)

## 2013-10-26 MED ORDER — FUROSEMIDE 20 MG PO TABS: 40.0000 mg | ORAL_TABLET | Freq: Every day | ORAL | Status: DC

## 2013-10-26 MED ORDER — POTASSIUM CHLORIDE CRYS ER 20 MEQ PO TBCR: 20.0000 meq | EXTENDED_RELEASE_TABLET | Freq: Every day | ORAL | Status: DC

## 2013-10-26 NOTE — Progress Notes (Signed)
[place], [place] [place], [place]  [address] Phone: [phone] Fax:  [phone]  Date:  10/26/2013   ID:  [name], [name] 1924-02-13, MRN [id number]  PCP:  Hollace Kinnier, DO  Cardiologist:  Fransico Him, MD     History of Present Illness: [name] [name] is a 80 y.o. female with a history of HTN and asthma who presents today for evaluation of LE edema and SOB.  She says that the SOB is very sporadic but has been on and off for a few months.  She says that usually she gets SOB when cleaning her house.  She denies any chest pain or pressure.  She denies any palpitations, dizziness or syncope.  She also has had some LE edema which started in her ankles back in October.  The LE edema comes and goes and has improved after starting Lasix.  She occasionally uses table salt but no much.     Wt Readings from Last 3 Encounters:  10/04/13 142 lb (64.411 kg)  08/20/13 143 lb 12.8 oz (65.227 kg)  07/09/13 145 lb (65.772 kg)     Past Medical History  Diagnosis Date  . Benign essential hypertension   . Osteoarthritis   . GERD (gastroesophageal reflux disease)   . Seasonal allergies   . Asthma   . Glaucoma   . PVD (peripheral vascular disease)   . Anxiety     Current Outpatient Prescriptions  Medication Sig Dispense Refill  . albuterol (PROVENTIL HFA;VENTOLIN HFA) 108 (90 BASE) MCG/ACT inhaler Inhale 2 puffs into the lungs every 6 (six) hours as needed for wheezing.      Marland Kitchen amLODipine (NORVASC) 5 MG tablet Take 5 mg by mouth daily.      . brinzolamide (AZOPT) 1 % ophthalmic suspension Place 1 drop into both eyes 3 (three) times daily.      . Calcium Carbonate-Vitamin D (CALTRATE 600+D) 600-400 MG-UNIT per tablet Take 2 tablets by mouth daily.      . Coenzyme Q10 (COQ10) 50 MG CAPS Take one tablet by mouth once daily      . fluticasone (FLONASE) 50 MCG/ACT nasal spray Place 1 spray into the nose daily.      . furosemide (LASIX) 20 MG tablet Take 20 mg by mouth daily.       .  lansoprazole (PREVACID) 30 MG capsule Take 1 capsule (30 mg total) by mouth daily.  30 capsule  3  . latanoprost (XALATAN) 0.005 % ophthalmic solution Place 1 drop into both eyes at bedtime.      Marland Kitchen loratadine (CLARITIN) 10 MG tablet Take 10 mg by mouth daily.      Marland Kitchen loteprednol (LOTEMAX) 0.5 % ophthalmic suspension Place 1 drop into the left eye daily.      . Multiple Vitamins-Minerals (SENIOR MULTIVITAMIN PLUS) TABS Take by mouth.      . Omega-3 Fatty Acids (FISH OIL) 1200 MG CPDR Take one tablet by mouth once daily      . QVAR 80 MCG/ACT inhaler Inhale two puffs twice daily; Rinse gargle and spit; Use with spacer      . risperiDONE (RISPERDAL) 0.5 MG tablet Take 1 tablet (0.5 mg total) by mouth 2 (two) times daily.  60 tablet  3  . sertraline (ZOLOFT) 25 MG tablet Take 1 tablet (25 mg total) by mouth daily.  30 tablet  0  . Tdap (ADACEL) 02-05-14.5 LF-MCG/0.5 injection Inject 0.5 mLs into the muscle once.  0.5 mL  0  .  vitamin B-12 (CYANOCOBALAMIN) 1000 MCG tablet Take 1,000 mcg by mouth daily.      . vitamin C (ASCORBIC ACID) 500 MG tablet Take 500 mg by mouth daily.       No current facility-administered medications for this visit.    Allergies:    Allergies  Allergen Reactions  . Aspirin   . Codeine   . Iodine     Pt is not aware of this allergy, does not recall much info....//a.c.  . Prednisone   . Sulfa Antibiotics     Social History:  The patient  reports that she has never smoked. She does not have any smokeless tobacco history on file. She reports that she does not drink alcohol or use illicit drugs.   Family History:  The patient's family history includes Stroke in her father.   ROS:  Please see the history of present illness.      All other systems reviewed and negative.   PHYSICAL EXAM: VS:  There were no vitals taken for this visit. Well nourished, well developed, in no acute distress HEENT: normal Neck: no JVD Cardiac:  normal S1, S2; RRR; no murmur Lungs:  clear  to auscultation bilaterally, no wheezing, rhonchi or rales Abd: soft, nontender, no hepatomegaly Ext: 2+ edemaR>L Skin: warm and dry Neuro:  CNs 2-12 intact, no focal abnormalities noted  EKG:     NSR with nonspecific IVCD  ASSESSMENT AND PLAN:  1. SOB  - I will get a 2D echo to assess LVF and diastolic function  - Lexiscan myoview to evaluate for ischemia  - check BNP/BMET 2. LE edema which may be exacerbated by amlodipine but she has been on that for a long time  - Increase Lasix 20mg  2 tablets daily 3. HTN - well controlled  - continue amlodipine 4.  Abnormal EKG with nonspecific IVCD  Followup with me in 3 weeks  Signed, Fransico Him, MD 10/26/2013 11:25 AM

## 2013-10-26 NOTE — Patient Instructions (Signed)
Your physician has recommended you make the following change in your medication: Increase your lasix to 20 MG 2 tablets daily  Your physician has requested that you have an echocardiogram. Echocardiography is a painless test that uses sound waves to create images of your heart. It provides your doctor with information about the size and shape of your heart and how well your heart's chambers and valves are working. This procedure takes approximately one hour. There are no restrictions for this procedure.  Your physician has requested that you have a lexiscan myoview. For further information please visit [email or web address]. Please follow instruction sheet, as given.  Your physician recommends that you go to the  Lab today for BMET and BNP  Your physician recommends that you schedule a follow-up appointment in: 3 Weeks with Dr Radford Pax

## 2013-10-26 NOTE — Telephone Encounter (Signed)
Message copied by Alcario Drought on Tue Oct 26, 2013  4:47 PM ------      Message from: Fransico Him R      Created: Tue Oct 26, 2013  4:13 PM       BNP is normal.  Potassium mildly reduced - please add Kdur 17meq daily and recheck BMET in 1 week ------

## 2013-10-26 NOTE — Telephone Encounter (Signed)
Pt notified. K-dur sent in. Pt will come back for Bmet on 11/04/13.

## 2013-11-01 ENCOUNTER — Ambulatory Visit: Payer: Medicare Other | Admitting: Internal Medicine

## 2013-11-04 ENCOUNTER — Ambulatory Visit (INDEPENDENT_AMBULATORY_CARE_PROVIDER_SITE_OTHER): Payer: Medicare Other | Admitting: Internal Medicine

## 2013-11-04 ENCOUNTER — Other Ambulatory Visit: Payer: Medicare Other

## 2013-11-04 ENCOUNTER — Encounter: Payer: Self-pay | Admitting: Internal Medicine

## 2013-11-04 VITALS — BP 124/68 | HR 90 | Temp 98.7°F | Wt 139.0 lb

## 2013-11-04 DIAGNOSIS — F22 Delusional disorders: Secondary | ICD-10-CM

## 2013-11-04 DIAGNOSIS — E876 Hypokalemia: Secondary | ICD-10-CM

## 2013-11-04 DIAGNOSIS — R443 Hallucinations, unspecified: Secondary | ICD-10-CM | POA: Diagnosis not present

## 2013-11-04 DIAGNOSIS — T502X5A Adverse effect of carbonic-anhydrase inhibitors, benzothiadiazides and other diuretics, initial encounter: Secondary | ICD-10-CM | POA: Diagnosis not present

## 2013-11-04 DIAGNOSIS — R609 Edema, unspecified: Secondary | ICD-10-CM

## 2013-11-04 DIAGNOSIS — R442 Other hallucinations: Secondary | ICD-10-CM

## 2013-11-04 NOTE — Progress Notes (Signed)
[name], female   DOB: [birth year], 80 y.o.   MRN: [id number]   Location:  [facility]   Allergies  Allergen Reactions  . Aspirin   . Codeine   . Iodine     Pt is not aware of this allergy, does not recall much info....//a.c.  . Prednisone   . Sulfa Antibiotics     Chief Complaint  Patient presents with  . Follow-up    1 month f/u    HPI: Patient is a 80 y.o. black female seen in the office today for f/u CHF--is still taking only lasix 20mg . Went to cardiology, Dr. Turner--recommended lasix 40mg  daily and potassium 59meq.  Got blood drawn.  Stress test was ordered.  Breathing has been about the same.  When brings in the mess that comes into her house, it bothers her breathing.  Can feel it coming into her right chest--makes it sore and sore in her throat.  Says she is having little gray balls in her stool and wonders if there is a laxative she can take to get the stuff out of her bowels.  Dr. Fredderick Phenix listened to her lungs also and he said they sound good.  Is sleeping well.  Is using her heat in her house now.  Taking the risperdal in the morning and the zoloft at night.  Not getting any dreams like she said she had when she took the risperdal at night.  Is down 4 lbs since taking lasix 20mg  daily.  Has not had repeat potassium level and not taking potassium.    Review of Systems:  Review of Systems  Constitutional: Negative for fever.  HENT: Negative for congestion.   Respiratory: Positive for shortness of breath.   Cardiovascular: Positive for leg swelling. Negative for chest pain.  Gastrointestinal: Negative for abdominal pain.  Genitourinary: Negative for dysuria.  Musculoskeletal: Negative for falls.  Skin: Negative for rash.  Neurological: Negative for dizziness.  Psychiatric/Behavioral: The patient is nervous/anxious.      Past Medical History  Diagnosis Date  . Benign essential hypertension   .  Osteoarthritis   . GERD (gastroesophageal reflux disease)   . Seasonal allergies   . Asthma   . Glaucoma   . PVD (peripheral vascular disease)   . Anxiety     Past Surgical History  Procedure Laterality Date  . Abdominal hysterectomy  1981  . Tonsillectomy  1953    Social History:   reports that she has never smoked. She does not have any smokeless tobacco history on file. She reports that she does not drink alcohol or use illicit drugs.  Family History  Problem Relation Age of Onset  . Stroke Father     Medications: Patient's Medications  New Prescriptions   No medications on file  Previous Medications   ALBUTEROL (PROVENTIL HFA;VENTOLIN HFA) 108 (90 BASE) MCG/ACT INHALER    Inhale 2 puffs into the lungs every 6 (six) hours as needed for wheezing.   AMLODIPINE (NORVASC) 5 MG TABLET    Take 5 mg by mouth daily.   BRINZOLAMIDE (AZOPT) 1 % OPHTHALMIC SUSPENSION    Place 1 drop into both eyes 3 (three) times daily.   CALCIUM CARBONATE-VITAMIN D (CALTRATE 600+D) 600-400 MG-UNIT PER TABLET    Take 2 tablets by mouth daily.   COENZYME Q10 (COQ10) 50 MG CAPS    Take one tablet by mouth once daily   FLUTICASONE (FLONASE) 50 MCG/ACT NASAL SPRAY  Place 1 spray into the nose daily.   FUROSEMIDE (LASIX) 20 MG TABLET    Take 2 tablets (40 mg total) by mouth daily.   LANSOPRAZOLE (PREVACID) 30 MG CAPSULE    Take 1 capsule (30 mg total) by mouth daily.   LATANOPROST (XALATAN) 0.005 % OPHTHALMIC SOLUTION    Place 1 drop into both eyes at bedtime.   LORATADINE (CLARITIN) 10 MG TABLET    Take 10 mg by mouth daily.   LOTEPREDNOL (LOTEMAX) 0.5 % OPHTHALMIC SUSPENSION    Place 1 drop into the left eye daily.   MULTIPLE VITAMINS-MINERALS (SENIOR MULTIVITAMIN PLUS) TABS    Take by mouth.   OMEGA-3 FATTY ACIDS (FISH OIL) 1200 MG CPDR    Take one tablet by mouth once daily   POTASSIUM CHLORIDE SA (K-DUR,KLOR-CON) 20 MEQ TABLET    Take 1 tablet (20 mEq total) by mouth daily.   QVAR 80 MCG/ACT  INHALER    Inhale two puffs twice daily; Rinse gargle and spit; Use with spacer   RISPERIDONE (RISPERDAL) 0.5 MG TABLET    Take 1 tablet (0.5 mg total) by mouth 2 (two) times daily.   SERTRALINE (ZOLOFT) 25 MG TABLET    Take 1 tablet (25 mg total) by mouth daily.   TDAP (ADACEL) 02-05-14.5 LF-MCG/0.5 INJECTION    Inject 0.5 mLs into the muscle once.   VITAMIN B-12 (CYANOCOBALAMIN) 1000 MCG TABLET    Take 1,000 mcg by mouth daily.   VITAMIN C (ASCORBIC ACID) 500 MG TABLET    Take 500 mg by mouth daily.  Modified Medications   No medications on file  Discontinued Medications   No medications on file     Physical Exam: Filed Vitals:   11/04/13 1504  BP: 124/68  Pulse: 90  Temp: 98.7 F (37.1 C)  TempSrc: Oral  Weight: 139 lb (63.05 kg)  SpO2: 99%  Physical Exam  Constitutional: She is oriented to person, place, and time. She appears well-developed and well-nourished.  Cardiovascular: Normal rate, regular rhythm, normal heart sounds and intact distal pulses.   Edema improved with compression hose and lasix  Pulmonary/Chest: Effort normal and breath sounds normal. She has no rales.  Abdominal: Soft. Bowel sounds are normal. She exhibits no distension and no mass. There is no tenderness.  Musculoskeletal: Normal range of motion.  Neurological: She is alert and oriented to person, place, and time.  Skin: Skin is warm and dry.   Labs reviewed: Basic Metabolic Panel:  Recent Labs  08/17/13 0836 10/26/13 1156  NA 142 140  K 4.0 3.4*  CL 104 104  CO2 24 29  GLUCOSE 110* 107*  BUN 17 16  CREATININE 0.82 0.9  CALCIUM 9.6 9.5   Liver Function Tests:  Recent Labs  08/17/13 0836  AST 20  ALT 17  ALKPHOS 82  BILITOT 0.5  PROT 6.8   No results found for this basename: LIPASE, AMYLASE,  in the last 8760 hours No results found for this basename: AMMONIA,  in the last 8760 hours CBC:  Recent Labs  08/17/13 0836  WBC 8.1  NEUTROABS 3.6  HGB 12.3  HCT 36.6  MCV 94    Lipid Panel:  Recent Labs  08/17/13 0836  HDL 105  LDLCALC 73  TRIG 47  CHOLHDL 1.8    Assessment/Plan  1. Diuretic-induced hypokalemia -on lasix, need K checked - Basic metabolic panel  2. Olfactory hallucinations -persist, not consistently using risperdal or zoloft prescribed so unlikely to improve -lives alone  and her daughter checks on her  3. Paranoid type delusional disorder -does not acknowledge any type of psychiatric disorder, is frustrated that no one smells what she does in her house or does anything about the problem -I have recommended she see psychiatry, but her daughter has been unable to convince her to go  4. Edema -improved with lasix use, compression hose -keep f/u with Dr. Radford Pax  Labs/tests ordered:   Orders Placed This Encounter  Procedures  . Basic metabolic panel    Next appt:  2 mos

## 2013-11-05 LAB — BASIC METABOLIC PANEL
BUN/Creatinine Ratio: 12 (ref 11–26)
BUN: 10 mg/dL (ref 8–27)
CO2: 23 mmol/L (ref 18–29)
Calcium: 9.4 mg/dL (ref 8.7–10.3)
Chloride: 105 mmol/L (ref 97–108)
Creatinine, Ser: 0.83 mg/dL (ref 0.57–1.00)
GFR calc Af Amer: 73 mL/min/{1.73_m2} (ref >59)
GFR calc non Af Amer: 64 mL/min/{1.73_m2} (ref >59)
Glucose: 112 mg/dL — ABNORMAL HIGH (ref 65–99)
Potassium: 3.9 mmol/L (ref 3.5–5.2)
Sodium: 143 mmol/L (ref 134–144)

## 2013-11-09 ENCOUNTER — Encounter (HOSPITAL_COMMUNITY): Payer: Medicare Other

## 2013-11-12 ENCOUNTER — Other Ambulatory Visit (HOSPITAL_COMMUNITY): Payer: Medicare Other

## 2013-11-16 DIAGNOSIS — H4011X0 Primary open-angle glaucoma, stage unspecified: Secondary | ICD-10-CM | POA: Diagnosis not present

## 2013-11-23 DIAGNOSIS — R442 Other hallucinations: Secondary | ICD-10-CM | POA: Insufficient documentation

## 2013-11-24 ENCOUNTER — Ambulatory Visit: Payer: Medicare Other | Admitting: Cardiology

## 2013-11-25 ENCOUNTER — Ambulatory Visit: Payer: Medicare Other | Admitting: Cardiology

## 2013-12-08 ENCOUNTER — Encounter: Payer: Self-pay | Admitting: Physician Assistant

## 2013-12-08 ENCOUNTER — Ambulatory Visit (INDEPENDENT_AMBULATORY_CARE_PROVIDER_SITE_OTHER): Payer: Medicare Other | Admitting: Physician Assistant

## 2013-12-08 VITALS — BP 130/58 | HR 68 | Ht 64.0 in | Wt 144.0 lb

## 2013-12-08 DIAGNOSIS — R0602 Shortness of breath: Secondary | ICD-10-CM | POA: Diagnosis not present

## 2013-12-08 DIAGNOSIS — R609 Edema, unspecified: Secondary | ICD-10-CM | POA: Diagnosis not present

## 2013-12-08 DIAGNOSIS — I10 Essential (primary) hypertension: Secondary | ICD-10-CM | POA: Diagnosis not present

## 2013-12-08 DIAGNOSIS — I739 Peripheral vascular disease, unspecified: Secondary | ICD-10-CM

## 2013-12-08 DIAGNOSIS — R600 Localized edema: Secondary | ICD-10-CM

## 2013-12-08 LAB — BASIC METABOLIC PANEL
BUN: 16 mg/dL (ref 6–23)
CO2: 26 mEq/L (ref 19–32)
Calcium: 9.6 mg/dL (ref 8.4–10.5)
Chloride: 108 mEq/L (ref 96–112)
Creatinine, Ser: 0.9 mg/dL (ref 0.4–1.2)
GFR: 80.24 mL/min (ref >60.00)
Glucose, Bld: 95 mg/dL (ref 70–99)
POTASSIUM: 3.5 meq/L (ref 3.5–5.1)
Sodium: 143 mEq/L (ref 135–145)

## 2013-12-08 MED ORDER — POTASSIUM CHLORIDE 20 MEQ/15ML (10%) PO LIQD: 20.0000 meq | Freq: Every day | ORAL | Status: DC

## 2013-12-08 NOTE — Assessment & Plan Note (Signed)
Patient's shortness of breath and edema has improved on higher dose diuretic. Her BNP was not elevated. She did not have a 2-D echo or a Lexi scan. She is agreed to proceed with a 2-D echo. I will change her to liquid potassium since she's having so much trouble with the pills. Check BMET today

## 2013-12-08 NOTE — Patient Instructions (Addendum)
Your physician has requested that you have an echocardiogram scheduled for March  11.12, or 13th for this patient . Echocardiography is a painless test that uses sound waves to create images of your heart. It provides your doctor with information about the size and shape of your heart and how well your heart's chambers and valves are working. This procedure takes approximately one hour. There are no restrictions for this procedure.  Your physician recommends that you schedule a follow-up appointment in: 2 months with Dr. Radford Pax   Your physician recommends that you have lab work today: BMET  Your physician has recommended you make the following change in your medication:   1. Change potassium to liquid version

## 2013-12-08 NOTE — Assessment & Plan Note (Signed)
Only a trace of edema today

## 2013-12-08 NOTE — Addendum Note (Signed)
Addended by: Burnett Kanaris on: 12/08/2013 11:03 AM   Modules accepted: Orders

## 2013-12-08 NOTE — Progress Notes (Signed)
HPI:  This is a very pleasant 80 year old African American female patient Dr. Golden Hurter who is here for three-week followup. She was seen by Dr. Radford Pax on 10/26/13 with shortness of breath and lower extremity edema. Her Lasix was increased to 40 mg daily. A 2-D echo and stress Myoview was ordered. The patient has been reluctant to schedule these because she's afraid of the stress test. She also had difficulty getting transportation. She is willing to do the 2-D echo but not a stress test at this time.  The patient complains of the potassium making her stomach upset evening if she takes it after a large meal. She says the swelling is a little better and can't remember that she was ever short of breath. Her weight today is 144 pounds but she has 6 sweaters on and was weighed with her purse. She was 140 pounds at her last office visit. I don't believe today's weight of 144 pounds is accurate. Her BNP was low at 57 and her Bmet was normal.   Allergies -- Aspirin   -- Codeine   -- Iodine    --  Pt is not aware of this allergy, does not recall            much info....//a.c.  -- Prednisone   -- Sulfa Antibiotics   Current Outpatient Prescriptions on File Prior to Visit: albuterol (PROVENTIL HFA;VENTOLIN HFA) 108 (90 BASE) MCG/ACT inhaler, Inhale 2 puffs into the lungs every 6 (six) hours as needed for wheezing., Disp: , Rfl:  amLODipine (NORVASC) 5 MG tablet, Take 5 mg by mouth daily., Disp: , Rfl:  brinzolamide (AZOPT) 1 % ophthalmic suspension, Place 1 drop into both eyes 3 (three) times daily., Disp: , Rfl:  Calcium Carbonate-Vitamin D (CALTRATE 600+D) 600-400 MG-UNIT per tablet, Take 2 tablets by mouth daily., Disp: , Rfl:  Coenzyme Q10 (COQ10) 50 MG CAPS, Take one tablet by mouth once daily, Disp: , Rfl:  fluticasone (FLONASE) 50 MCG/ACT nasal spray, Place 1 spray into the nose daily., Disp: , Rfl:  furosemide (LASIX) 20 MG tablet, Take 2 tablets (40 mg total) by mouth daily., Disp: 60 tablet,  Rfl: 11 lansoprazole (PREVACID) 30 MG capsule, Take 1 capsule (30 mg total) by mouth daily., Disp: 30 capsule, Rfl: 3 latanoprost (XALATAN) 0.005 % ophthalmic solution, Place 1 drop into both eyes at bedtime., Disp: , Rfl:  loratadine (CLARITIN) 10 MG tablet, Take 10 mg by mouth daily., Disp: , Rfl:  loteprednol (LOTEMAX) 0.5 % ophthalmic suspension, Place 1 drop into the left eye daily., Disp: , Rfl:  Multiple Vitamins-Minerals (SENIOR MULTIVITAMIN PLUS) TABS, Take by mouth., Disp: , Rfl:  Omega-3 Fatty Acids (FISH OIL) 1200 MG CPDR, Take one tablet by mouth once daily, Disp: , Rfl:  potassium chloride SA (K-DUR,KLOR-CON) 20 MEQ tablet, Take 1 tablet (20 mEq total) by mouth daily., Disp: 30 tablet, Rfl: 6 QVAR 80 MCG/ACT inhaler, Inhale two puffs twice daily; Rinse gargle and spit; Use with spacer, Disp: , Rfl:  risperiDONE (RISPERDAL) 0.5 MG tablet, Take 1 tablet (0.5 mg total) by mouth 2 (two) times daily., Disp: 60 tablet, Rfl: 3 sertraline (ZOLOFT) 25 MG tablet, Take 1 tablet (25 mg total) by mouth daily., Disp: 30 tablet, Rfl: 0 Tdap (ADACEL) 02-05-14.5 LF-MCG/0.5 injection, Inject 0.5 mLs into the muscle once., Disp: 0.5 mL, Rfl: 0 vitamin B-12 (CYANOCOBALAMIN) 1000 MCG tablet, Take 1,000 mcg by mouth daily., Disp: , Rfl:  vitamin C (ASCORBIC ACID) 500 MG tablet, Take 500 mg by mouth daily.,  Disp: , Rfl:   No current facility-administered medications on file prior to visit.   Past Medical History:   Benign essential hypertension                                Osteoarthritis                                               GERD (gastroesophageal reflux disease)                       Seasonal allergies                                           Asthma                                                       Glaucoma                                                     PVD (peripheral vascular disease)                            Anxiety                                                     Past  Surgical History:   ABDOMINAL HYSTERECTOMY                           1981         TONSILLECTOMY                                    1953        Review of patient's family history indicates:   Stroke                         Father                   Social History   Marital Status: Married             Spouse Name:                      Years of Education:                 Number of children:             Occupational History   None on file  Social History Main Topics   Smoking Status: Never Smoker  Smokeless Status: Not on file                      Alcohol Use: No             Drug Use: No             Sexual Activity: Not on file        Other Topics            Concern   None on file  Social History Narrative   Was cosmetologist, 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 Neck: No JVD, HJR, Bruit, or thyroid enlargement  Lungs: No tachypnea, clear without wheezing, rales, or rhonchi  Cardiovascular: RRR, PMI not displaced, positive S3 and S4 and 2/6 systolic murmur at the left sternal border, no bruit, thrill, or heave.  Abdomen: BS normal. Soft without organomegaly, masses, lesions or tenderness.  Extremities: Trace of edema in the right lower extremity otherwise without cyanosis, clubbing or edema. Good distal pulses bilateral  SKin: Warm, no lesions or rashes   Musculoskeletal: No deformities  Neuro: no focal signs  BP 130/58  Pulse 68  Ht 5\' 4"  (1.626 m)  Wt 144 lb (65.318 kg)  BMI 24.71 kg/m2   2-D echo 2010  Study Conclusions   1. Left ventricle: The cavity size was normal. Wall thickness was     increased in a pattern of moderate LVH. Systolic function was     normal. The estimated ejection fraction was in the range of 60%     to 65%. Wall motion was normal; there were no regional wall     motion  abnormalities. Left ventricular diastolic function     parameters were normal.  2. Right ventricle: Systolic pressure was increased.  3. Tricuspid valve: Mild regurgitation.  4. Pericardium, extracardiac: A trivial pericardial effusion was     identified.

## 2013-12-08 NOTE — Assessment & Plan Note (Signed)
Blood pressure stable ? ?

## 2013-12-15 ENCOUNTER — Ambulatory Visit (HOSPITAL_COMMUNITY)
Admission: RE | Admit: 2013-12-15 | Discharge: 2013-12-15 | Disposition: A | Discharge status: Home / Self Care | Payer: Medicare Other | Source: Ambulatory Visit | Attending: Cardiovascular Disease | Admitting: Cardiovascular Disease

## 2013-12-15 DIAGNOSIS — I739 Peripheral vascular disease, unspecified: Secondary | ICD-10-CM | POA: Diagnosis not present

## 2013-12-15 DIAGNOSIS — I369 Nonrheumatic tricuspid valve disorder, unspecified: Secondary | ICD-10-CM | POA: Diagnosis not present

## 2013-12-15 DIAGNOSIS — H20019 Primary iridocyclitis, unspecified eye: Secondary | ICD-10-CM | POA: Diagnosis not present

## 2013-12-15 DIAGNOSIS — H353190 Nonexudative age-related macular degeneration, unspecified eye, stage unspecified: Secondary | ICD-10-CM | POA: Diagnosis not present

## 2013-12-15 NOTE — Progress Notes (Signed)
2D Echo Performed 12/15/2013    Marygrace Drought, RCS

## 2013-12-16 ENCOUNTER — Encounter: Payer: Self-pay | Admitting: General Surgery

## 2013-12-22 DIAGNOSIS — H4011X0 Primary open-angle glaucoma, stage unspecified: Secondary | ICD-10-CM | POA: Diagnosis not present

## 2013-12-22 DIAGNOSIS — H353190 Nonexudative age-related macular degeneration, unspecified eye, stage unspecified: Secondary | ICD-10-CM | POA: Diagnosis not present

## 2013-12-22 DIAGNOSIS — H20029 Recurrent acute iridocyclitis, unspecified eye: Secondary | ICD-10-CM | POA: Diagnosis not present

## 2014-01-03 ENCOUNTER — Ambulatory Visit (INDEPENDENT_AMBULATORY_CARE_PROVIDER_SITE_OTHER): Payer: Medicare Other | Admitting: Internal Medicine

## 2014-01-03 ENCOUNTER — Encounter: Payer: Self-pay | Admitting: Internal Medicine

## 2014-01-03 VITALS — BP 128/66 | HR 78 | Temp 97.3°F | Wt 143.0 lb

## 2014-01-03 DIAGNOSIS — M25476 Effusion, unspecified foot: Secondary | ICD-10-CM | POA: Diagnosis not present

## 2014-01-03 DIAGNOSIS — T502X5A Adverse effect of carbonic-anhydrase inhibitors, benzothiadiazides and other diuretics, initial encounter: Secondary | ICD-10-CM

## 2014-01-03 DIAGNOSIS — M25471 Effusion, right ankle: Secondary | ICD-10-CM

## 2014-01-03 DIAGNOSIS — R442 Other hallucinations: Secondary | ICD-10-CM

## 2014-01-03 DIAGNOSIS — F22 Delusional disorders: Secondary | ICD-10-CM

## 2014-01-03 DIAGNOSIS — E876 Hypokalemia: Secondary | ICD-10-CM | POA: Diagnosis not present

## 2014-01-03 DIAGNOSIS — R443 Hallucinations, unspecified: Secondary | ICD-10-CM

## 2014-01-03 DIAGNOSIS — M25473 Effusion, unspecified ankle: Secondary | ICD-10-CM

## 2014-01-03 MED ORDER — FUROSEMIDE 20 MG PO TABS: 20.0000 mg | ORAL_TABLET | Freq: Every day | ORAL | Status: DC

## 2014-01-03 NOTE — Patient Instructions (Addendum)
Take your lasix 20mg  as early as possible when you get up in the morning to help prevent having to urinate at night and trouble making it to the bathroom.   Try not to drink cranberry juice anytime after 5-6pm also.

## 2014-01-03 NOTE — Progress Notes (Signed)
[name], female   DOB: 13-May-1924, 80 y.o.   MRN: [id number]   Location:  [facility]  Code Status: has living will--need to discuss MOST with her and her daughter  Allergies  Allergen Reactions  . Aspirin   . Codeine   . Iodine     Pt is not aware of this allergy, does not recall much info....//a.c.  . Prednisone   . Sulfa Antibiotics     Chief Complaint  Patient presents with  . Medical Managment of Chronic Issues    2 month follow-up   . Urinary Incontinence    Patient unable to get to the rest room in time, newly developed within the last 2 months   . GI Problem    Stomach Spasms off/on- ongoing concern     HPI: Patient is a 80 y.o. black female seen in the office today for 2 month f/u to manage chronic medical problems.  Has new difficulty with urinary incontinence.  Takes lasix 40mg  daily in place of 20mg  daily.  Is incontinent almost every time she has to go.  It is worst when she wakes up at 4am onward.  Ankle swelling has improved.        Is taking risperdal twice daily right now.  Continues to believe that a man is pumping noxious air into her home.  Breathing better though with lasix 20mg  daily--not taking 40mg  and also not taking the potassium with it (pill or liquid).  Had echocardiogram 12/15/13 with EF 55-60%, no significant abnormalities or valves or heart structure noted either.      Allergies are doing good.    For her asthma, she says she is taking 2.5mg  of prednisone???  She is on qvar and albuterol also.  Breathing is good.  Review of Systems:  Review of Systems  Constitutional: Negative for fever, chills and malaise/fatigue.  HENT: Negative for congestion.   Eyes: Negative for blurred vision.  Respiratory: Negative for shortness of breath.   Cardiovascular: Negative for chest pain and leg swelling.  Gastrointestinal: Negative for abdominal pain and constipation.  Genitourinary: Positive for  urgency and frequency. Negative for dysuria, hematuria and flank pain.  Musculoskeletal: Negative for falls and myalgias.  Skin: Negative for rash.  Neurological: Negative for weakness and headaches.  Psychiatric/Behavioral: Positive for depression and hallucinations. Negative for memory loss. The patient is nervous/anxious.     Past Medical History  Diagnosis Date  . Benign essential hypertension   . Osteoarthritis   . GERD (gastroesophageal reflux disease)   . Seasonal allergies   . Asthma   . Glaucoma   . PVD (peripheral vascular disease)   . Anxiety     Past Surgical History  Procedure Laterality Date  . Abdominal hysterectomy  1981  . Tonsillectomy  1953    Social History:   reports that she has never smoked. She does not have any smokeless tobacco history on file. She reports that she does not drink alcohol or use illicit drugs.  Family History  Problem Relation Age of Onset  . Stroke Father     Medications: Patient's Medications  New Prescriptions   No medications on file  Previous Medications   ALBUTEROL (PROVENTIL HFA;VENTOLIN HFA) 108 (90 BASE) MCG/ACT INHALER    Inhale 2 puffs into the lungs every 6 (six) hours as needed for wheezing.   AMLODIPINE (NORVASC) 5 MG TABLET    Take 5 mg by mouth daily.  BRINZOLAMIDE (AZOPT) 1 % OPHTHALMIC SUSPENSION    Place 1 drop into both eyes 3 (three) times daily.   CALCIUM CARBONATE-VITAMIN D (CALTRATE 600+D) 600-400 MG-UNIT PER TABLET    Take 2 tablets by mouth daily.   COENZYME Q10 (COQ10) 50 MG CAPS    Take one tablet by mouth once daily   FLUTICASONE (FLONASE) 50 MCG/ACT NASAL SPRAY    Place 1 spray into the nose daily.   FUROSEMIDE (LASIX) 20 MG TABLET    Take 2 tablets (40 mg total) by mouth daily.   LANSOPRAZOLE (PREVACID) 30 MG CAPSULE    Take 1 capsule (30 mg total) by mouth daily.   LATANOPROST (XALATAN) 0.005 % OPHTHALMIC SOLUTION    Place 1 drop into both eyes at bedtime.   LORATADINE (CLARITIN) 10 MG TABLET     Take 10 mg by mouth daily.   LOTEPREDNOL (LOTEMAX) 0.5 % OPHTHALMIC SUSPENSION    Place 1 drop into the left eye daily.   MULTIPLE VITAMINS-MINERALS (SENIOR MULTIVITAMIN PLUS) TABS    Take by mouth.   OMEGA-3 FATTY ACIDS (FISH OIL) 1200 MG CPDR    Take one tablet by mouth once daily   POTASSIUM CHLORIDE 20 MEQ/15ML (10%) SOLUTION    Take 15 mLs (20 mEq total) by mouth daily.   QVAR 80 MCG/ACT INHALER    Inhale two puffs twice daily; Rinse gargle and spit; Use with spacer   RISPERIDONE (RISPERDAL) 0.5 MG TABLET    Take 1 tablet (0.5 mg total) by mouth 2 (two) times daily.   SERTRALINE (ZOLOFT) 25 MG TABLET    Take 1 tablet (25 mg total) by mouth daily.   TDAP (ADACEL) 02-05-14.5 LF-MCG/0.5 INJECTION    Inject 0.5 mLs into the muscle once.   VITAMIN B-12 (CYANOCOBALAMIN) 1000 MCG TABLET    Take 1,000 mcg by mouth daily.   VITAMIN C (ASCORBIC ACID) 500 MG TABLET    Take 500 mg by mouth daily.  Modified Medications   No medications on file  Discontinued Medications   No medications on file     Physical Exam: Filed Vitals:   01/03/14 1050  BP: 128/66  Pulse: 78  Temp: 97.3 F (36.3 C)  TempSrc: Oral  Weight: 143 lb (64.864 kg)  SpO2: 98%  Physical Exam  Constitutional: She is oriented to person, place, and time. She appears well-developed and well-nourished. No distress.  Cardiovascular: Normal rate, regular rhythm, normal heart sounds and intact distal pulses.   Pulmonary/Chest: Effort normal and breath sounds normal. No respiratory distress.  Abdominal: Soft. Bowel sounds are normal. She exhibits no distension and no mass. There is no tenderness.  Musculoskeletal: Normal range of motion. She exhibits no edema and no tenderness.  Neurological: She is alert and oriented to person, place, and time.  Skin: Skin is warm and dry.  Psychiatric: She has a normal mood and affect.  Continues to have olfactory and visual hallucinations    Labs reviewed: Basic Metabolic Panel:  Recent  Labs  10/26/13 1156 11/04/13 1620 12/08/13 1137  NA 140 143 143  K 3.4* 3.9 3.5  CL 104 105 108  CO2 29 23 26   GLUCOSE 107* 112* 95  BUN 16 10 16   CREATININE 0.9 0.83 0.9  CALCIUM 9.5 9.4 9.6   Liver Function Tests:  Recent Labs  08/17/13 0836  AST 20  ALT 17  ALKPHOS 82  BILITOT 0.5  PROT 6.8  CBC:  Recent Labs  08/17/13 0836  WBC 8.1  NEUTROABS  3.6  HGB 12.3  HCT 36.6  MCV 94   Lipid Panel:  Recent Labs  08/17/13 0836  HDL 105  LDLCALC 73  TRIG 47  CHOLHDL 1.8   Assessment/Plan 1. Olfactory hallucinations -continue risperdal 0.5mg  po bid -pt will not see a psychiatrist and does not recognize that these are not real  2. Paranoid type delusional disorder -cont risperdal -has refused to take zoloft along with it  3. Right ankle swelling - better with lasix 20mg --refuses to take 40mg  saying she got cramps with it, but it seems to me that she didn't take the kcl tablet b/c it was too big--was then given liquid by cardiology and still didn't take - furosemide (LASIX) 20 MG tablet; Take 1 tablet (20 mg total) by mouth daily.  Dispense: 30 tablet; Refill: 3  4. Diuretic-induced hypokalemia -encourage kcl liquid use, but pt says she wants to take ensure, OJ, and enriched soy milk and that that was all she was doing when he K was normal on her labs at cardiology  Next appt:  3 mos

## 2014-01-10 ENCOUNTER — Other Ambulatory Visit: Payer: Self-pay

## 2014-01-10 MED ORDER — AMLODIPINE BESYLATE 5 MG PO TABS: 5.0000 mg | ORAL_TABLET | Freq: Every day | ORAL | Status: DC

## 2014-01-21 DIAGNOSIS — H3553 Other dystrophies primarily involving the sensory retina: Secondary | ICD-10-CM | POA: Diagnosis not present

## 2014-01-21 DIAGNOSIS — M19049 Primary osteoarthritis, unspecified hand: Secondary | ICD-10-CM | POA: Diagnosis not present

## 2014-01-24 DIAGNOSIS — H4011X0 Primary open-angle glaucoma, stage unspecified: Secondary | ICD-10-CM | POA: Diagnosis not present

## 2014-02-10 ENCOUNTER — Encounter: Payer: Self-pay | Admitting: Cardiology

## 2014-02-10 ENCOUNTER — Ambulatory Visit (INDEPENDENT_AMBULATORY_CARE_PROVIDER_SITE_OTHER): Payer: Medicare Other | Admitting: Cardiology

## 2014-02-10 VITALS — BP 120/50 | HR 73 | Ht 64.0 in | Wt 134.0 lb

## 2014-02-10 DIAGNOSIS — I10 Essential (primary) hypertension: Secondary | ICD-10-CM | POA: Diagnosis not present

## 2014-02-10 DIAGNOSIS — R0602 Shortness of breath: Secondary | ICD-10-CM

## 2014-02-10 NOTE — Progress Notes (Signed)
[address] [place], [place]  [address] Phone: [phone] Fax:  [phone]  Date:  02/10/2014   ID:  Tawney, Vanorman 1924/09/18, MRN [id number]  PCP:  Hollace Kinnier, DO  Cardiologist:  Fransico Him, MD     History of Present Illness: [name] [name] is a 80 y.o. female with a history of HTN and asthma who presents today for followup of LE edema and SOB. She saw me in January and at that time the SOB was very sporadic but has been on and off for a few months. She said that usually she gets SOB when cleaning her house. She denied any chest pain or pressure. She denied any palpitations, dizziness or syncope. She also  had some LE edema which started in her ankles back in October. The LE edema would come and goe and improved after starting Lasix. She was also using occasioanl table salt but no much. I increase her Lasix to 40mg  daily.  A 2D echo showed normal LVF.  She says that she could not take the higher dose of Lasix because it drained her out.  She says that her breathing is much better and almost completely gone unless she really exerts herself.  She denies any chest pain and has very little LE edema.      Wt Readings from Last 3 Encounters:  02/10/14 134 lb (60.782 kg)  01/03/14 143 lb (64.864 kg)  12/08/13 144 lb (65.318 kg)     Past Medical History  Diagnosis Date  . Benign essential hypertension   . Osteoarthritis   . GERD (gastroesophageal reflux disease)   . Seasonal allergies   . Asthma   . Glaucoma   . PVD (peripheral vascular disease)   . Anxiety     Current Outpatient Prescriptions  Medication Sig Dispense Refill  . albuterol (PROVENTIL HFA;VENTOLIN HFA) 108 (90 BASE) MCG/ACT inhaler Inhale 2 puffs into the lungs every 6 (six) hours as needed for wheezing.      Marland Kitchen amLODipine (NORVASC) 5 MG tablet Take 1 tablet (5 mg total) by mouth daily.  30 tablet  5  . brinzolamide (AZOPT) 1 % ophthalmic suspension Place 1 drop into both eyes 3 (three) times  daily.      . Calcium Carbonate-Vitamin D (CALTRATE 600+D) 600-400 MG-UNIT per tablet Take 2 tablets by mouth daily.      . Coenzyme Q10 (COQ10) 50 MG CAPS Take one tablet by mouth once daily      . fluticasone (FLONASE) 50 MCG/ACT nasal spray Place 1 spray into the nose daily.      . furosemide (LASIX) 20 MG tablet Take 1 tablet (20 mg total) by mouth daily.  30 tablet  3  . lansoprazole (PREVACID) 30 MG capsule Take 1 capsule (30 mg total) by mouth daily.  30 capsule  3  . latanoprost (XALATAN) 0.005 % ophthalmic solution Place 1 drop into both eyes at bedtime.      Marland Kitchen loratadine (CLARITIN) 10 MG tablet Take 10 mg by mouth daily.      Marland Kitchen loteprednol (LOTEMAX) 0.5 % ophthalmic suspension Place 1 drop into the left eye daily.      . Multiple Vitamins-Minerals (SENIOR MULTIVITAMIN PLUS) TABS Take by mouth.      . Omega-3 Fatty Acids (FISH OIL) 1200 MG CPDR Take one tablet by mouth once daily      . potassium chloride 20 MEQ/15ML (10%) solution Take 15 mLs (20 mEq total) by mouth daily.  500 mL  1  . QVAR 80 MCG/ACT inhaler Inhale two puffs twice daily; Rinse gargle and spit; Use with spacer      . risperiDONE (RISPERDAL) 0.5 MG tablet Take 1 tablet (0.5 mg total) by mouth 2 (two) times daily.  60 tablet  3  . Tdap (ADACEL) 02-05-14.5 LF-MCG/0.5 injection Inject 0.5 mLs into the muscle once.  0.5 mL  0  . vitamin B-12 (CYANOCOBALAMIN) 1000 MCG tablet Take 1,000 mcg by mouth daily.      . vitamin C (ASCORBIC ACID) 500 MG tablet Take 500 mg by mouth daily.       No current facility-administered medications for this visit.    Allergies:    Allergies  Allergen Reactions  . Aspirin   . Codeine   . Iodine     Pt is not aware of this allergy, does not recall much info....//a.c.  . Prednisone   . Sulfa Antibiotics     Social History:  The patient  reports that she has never smoked. She does not have any smokeless tobacco history on file. She reports that she does not drink alcohol or use illicit  drugs.   Family History:  The patient's family history includes Stroke in her father.   ROS:  Please see the history of present illness.      All other systems reviewed and negative.   PHYSICAL EXAM: VS:  BP 120/50  Pulse 73  Ht 5\' 4"  (1.626 m)  Wt 134 lb (60.782 kg)  BMI 22.99 kg/m2 Well nourished, well developed, in no acute distress HEENT: normal Neck: no JVD Cardiac:  normal S1, S2; RRR; no murmur Lungs:  clear to auscultation bilaterally, no wheezing, rhonchi or rales Abd: soft, nontender, no hepatomegaly Ext: no edema Skin: warm and dry Neuro:  CNs 2-12 intact, no focal abnormalities noted   ASSESSMENT AND PLAN:  1.  SOB - resolved after she took extra Lasix for a few days.  Her BNP was normal.  2D echo was normal.  She cancelled her stress test and does not want to proceed with it. 2.  LE edema which seems resolved - continue lasix 20mg   daily  3.  HTN - well controlled - continue amlodipine  4. Abnormal EKG with nonspecific IVCD   Followup with me in 6 months     Signed, Fransico Him, MD 02/10/2014 10:21 AM

## 2014-02-10 NOTE — Patient Instructions (Signed)
Your physician recommends that you continue on your current medications as directed. Please refer to the Current Medication list given to you today.  Your physician wants you to follow-up in: 6 months with Dr Turner You will receive a reminder letter in the mail two months in advance. If you don't receive a letter, please call our office to schedule the follow-up appointment.  

## 2014-03-04 DIAGNOSIS — M19049 Primary osteoarthritis, unspecified hand: Secondary | ICD-10-CM | POA: Diagnosis not present

## 2014-03-09 DIAGNOSIS — H1045 Other chronic allergic conjunctivitis: Secondary | ICD-10-CM | POA: Diagnosis not present

## 2014-03-09 DIAGNOSIS — H4011X0 Primary open-angle glaucoma, stage unspecified: Secondary | ICD-10-CM | POA: Diagnosis not present

## 2014-03-22 ENCOUNTER — Other Ambulatory Visit: Payer: Self-pay | Admitting: Internal Medicine

## 2014-04-04 ENCOUNTER — Encounter: Payer: Self-pay | Admitting: Internal Medicine

## 2014-04-04 ENCOUNTER — Ambulatory Visit (INDEPENDENT_AMBULATORY_CARE_PROVIDER_SITE_OTHER): Payer: Medicare Other | Admitting: Internal Medicine

## 2014-04-04 ENCOUNTER — Other Ambulatory Visit: Payer: Self-pay | Admitting: *Deleted

## 2014-04-04 VITALS — BP 142/70 | HR 61 | Temp 97.7°F | Resp 18 | Ht 64.0 in | Wt 136.4 lb

## 2014-04-04 DIAGNOSIS — K219 Gastro-esophageal reflux disease without esophagitis: Secondary | ICD-10-CM

## 2014-04-04 DIAGNOSIS — R442 Other hallucinations: Secondary | ICD-10-CM

## 2014-04-04 DIAGNOSIS — H409 Unspecified glaucoma: Secondary | ICD-10-CM

## 2014-04-04 DIAGNOSIS — J4520 Mild intermittent asthma, uncomplicated: Secondary | ICD-10-CM

## 2014-04-04 DIAGNOSIS — R1032 Left lower quadrant pain: Secondary | ICD-10-CM | POA: Diagnosis not present

## 2014-04-04 DIAGNOSIS — K649 Unspecified hemorrhoids: Secondary | ICD-10-CM

## 2014-04-04 DIAGNOSIS — R109 Unspecified abdominal pain: Secondary | ICD-10-CM

## 2014-04-04 DIAGNOSIS — R443 Hallucinations, unspecified: Secondary | ICD-10-CM | POA: Diagnosis not present

## 2014-04-04 DIAGNOSIS — R1030 Lower abdominal pain, unspecified: Secondary | ICD-10-CM

## 2014-04-04 DIAGNOSIS — J45909 Unspecified asthma, uncomplicated: Secondary | ICD-10-CM

## 2014-04-04 DIAGNOSIS — G609 Hereditary and idiopathic neuropathy, unspecified: Secondary | ICD-10-CM

## 2014-04-04 DIAGNOSIS — R52 Pain, unspecified: Secondary | ICD-10-CM

## 2014-04-04 DIAGNOSIS — G5793 Unspecified mononeuropathy of bilateral lower limbs: Secondary | ICD-10-CM

## 2014-04-04 LAB — POCT URINALYSIS DIPSTICK
Bilirubin, UA: NEGATIVE
Blood, UA: NEGATIVE
Glucose, UA: NEGATIVE
Ketones, UA: NEGATIVE
Leukocytes, UA: NEGATIVE
Nitrite, UA: NEGATIVE
Spec Grav, UA: 1.01
Urobilinogen, UA: NEGATIVE
pH, UA: 5

## 2014-04-04 MED ORDER — HYDROCORTISONE 2.5 % RE CREA: TOPICAL_CREAM | Freq: Two times a day (BID) | RECTAL | Status: DC

## 2014-04-04 NOTE — Patient Instructions (Signed)
Diverticulosis Diverticulosis is the condition that develops when small pouches (diverticula) form in the wall of your colon. Your colon, or large intestine, is where water is absorbed and stool is formed. The pouches form when the inside layer of your colon pushes through weak spots in the outer layers of your colon. CAUSES  No one knows exactly what causes diverticulosis. RISK FACTORS  Being older than 50. Your risk for this condition increases with age. Diverticulosis is rare in people younger than 40 years. By age 80, almost everyone has it.  Eating a low-fiber diet.  Being frequently constipated.  Being overweight.  Not getting enough exercise.  Smoking.  Taking over-the-counter pain medicines, like aspirin and ibuprofen. SYMPTOMS  Most people with diverticulosis do not have symptoms. DIAGNOSIS  Because diverticulosis often has no symptoms, health care providers often discover the condition during an exam for other colon problems. In many cases, a health care provider will diagnose diverticulosis while using a flexible scope to examine the colon (colonoscopy). TREATMENT  If you have never developed an infection related to diverticulosis, you may not need treatment. If you have had an infection before, treatment may include:  Eating more fruits, vegetables, and grains.  Taking a fiber supplement.  Taking a live bacteria supplement (probiotic).  Taking medicine to relax your colon. HOME CARE INSTRUCTIONS   Drink at least 6-8 glasses of water each day to prevent constipation.  Try not to strain when you have a bowel movement.  Keep all follow-up appointments. If you have had an infection before:  Increase the fiber in your diet as directed by your health care provider or dietitian.  Take a dietary fiber supplement if your health care provider approves.  Only take medicines as directed by your health care provider. SEEK MEDICAL CARE IF:   You have abdominal  pain.  You have bloating.  You have cramps.  You have not gone to the bathroom in 3 days. SEEK IMMEDIATE MEDICAL CARE IF:   Your pain gets worse.  Yourbloating becomes very bad.  You have a fever or chills, and your symptoms suddenly get worse.  You begin vomiting.  You have bowel movements that are bloody or black. MAKE SURE YOU:  Understand these instructions.  Will watch your condition.  Will get help right away if you are not doing well or get worse. Document Released: 06/20/2004 Document Revised: 09/28/2013 Document Reviewed: 08/18/2013 [employer] Patient Information 2015 [employer]. This information is not intended to replace advice given to you by your health care provider. Make sure you discuss any questions you have with your health care provider.  

## 2014-04-04 NOTE — Progress Notes (Signed)
[name], female   DOB: 1924/08/10, 80 y.o.   MRN: [id number]   Location:  [facility]  Code Status: DNR  Allergies  Allergen Reactions  . Aspirin   . Codeine   . Iodine     Pt is not aware of this allergy, does not recall much info....//a.c.  . Prednisone   . Sulfa Antibiotics     Chief Complaint  Patient presents with  . Follow-up    HPI: Patient is a 80 y.o. black female seen in the office today for medical mgt of chronic diseases.  Went to eye doctor on 6/3.  Has been having a lot of trouble with her eyes.  Picked up a new allergy in her eye.  He gave her some drops for that.  Dr. Debbe Bales is retiring.  She will be seeing Dr. Venetia Maxon instead.  He wanted some of her records from here.    Still having problems with her stomach.  Has a pain in her left side and over her bladder.  It comes and goes.  Not there now, but was when she got up.  Bowels are moving regularly.  Eating healthy food, but eating less frequently, she says.  Does not have much of an appetite--eats b/c she knows she needs to eat.  Lost 2.5 lbs since last visit.  Legs are not as swollen either.    Mood has been pretty good.  Smell still persisting in house.  Has let it go she says.  Sleeping ok.    No other pains right now.    Breathing well.  Not short of breath.  No chest pains.    Has hemorrhoids.  Says she's been sitting on a hard stool to do her work and that has flared up the hemorrhoids.  Stopped her lasix.    Review of Systems:  Review of Systems  Constitutional: Negative for fever.  HENT: Negative for congestion.   Eyes: Positive for pain. Negative for blurred vision.       Seeing ophtho about her eyes and allergies  Respiratory: Negative for shortness of breath.   Cardiovascular: Positive for leg swelling. Negative for chest pain.       Better with stockings  Gastrointestinal: Positive for abdominal pain. Negative for  diarrhea, constipation, blood in stool and melena.  Genitourinary: Positive for urgency and frequency. Negative for dysuria and hematuria.       In mornings  Musculoskeletal: Negative for falls and myalgias.  Skin: Negative for rash.  Neurological: Negative for dizziness, loss of consciousness, weakness and headaches.  Psychiatric/Behavioral: Positive for hallucinations. Negative for depression and memory loss. The patient is not nervous/anxious and does not have insomnia.      Past Medical History  Diagnosis Date  . Benign essential hypertension   . Osteoarthritis   . GERD (gastroesophageal reflux disease)   . Seasonal allergies   . Asthma   . Glaucoma   . PVD (peripheral vascular disease)   . Anxiety     Past Surgical History  Procedure Laterality Date  . Abdominal hysterectomy  1981  . Tonsillectomy  1953    Social History:   reports that she has never smoked. She does not have any smokeless tobacco history on file. She reports that she does not drink alcohol or use illicit drugs.  Family History  Problem Relation Age of Onset  . Stroke Father     Medications: Patient's Medications  New  Prescriptions   No medications on file  Previous Medications   ALBUTEROL (PROVENTIL HFA;VENTOLIN HFA) 108 (90 BASE) MCG/ACT INHALER    Inhale 2 puffs into the lungs every 6 (six) hours as needed for wheezing.   AMLODIPINE (NORVASC) 5 MG TABLET    Take 1 tablet (5 mg total) by mouth daily.   BRINZOLAMIDE (AZOPT) 1 % OPHTHALMIC SUSPENSION    Place 1 drop into both eyes 3 (three) times daily.   CALCIUM CARBONATE-VITAMIN D (CALTRATE 600+D) 600-400 MG-UNIT PER TABLET    Take 2 tablets by mouth daily.   COENZYME Q10 (COQ10) 50 MG CAPS    Take one tablet by mouth once daily   FLUTICASONE (FLONASE) 50 MCG/ACT NASAL SPRAY    Place 1 spray into the nose daily.   FUROSEMIDE (LASIX) 20 MG TABLET    Take 1 tablet (20 mg total) by mouth daily.   LANSOPRAZOLE (PREVACID) 30 MG CAPSULE    Take 1  capsule (30 mg total) by mouth daily.   LATANOPROST (XALATAN) 0.005 % OPHTHALMIC SOLUTION    Place 1 drop into both eyes at bedtime.   LORATADINE (CLARITIN) 10 MG TABLET    Take 10 mg by mouth daily.   LOTEPREDNOL (LOTEMAX) 0.5 % OPHTHALMIC SUSPENSION    Place 1 drop into the left eye daily.   MULTIPLE VITAMINS-MINERALS (SENIOR MULTIVITAMIN PLUS) TABS    Take by mouth.   OMEGA-3 FATTY ACIDS (FISH OIL) 1200 MG CPDR    Take one tablet by mouth once daily   PATADAY 0.2 % SOLN    Place 1 drop into the right eye as needed.   POTASSIUM CHLORIDE 20 MEQ/15ML (10%) SOLUTION    Take 15 mLs (20 mEq total) by mouth daily.   QVAR 80 MCG/ACT INHALER    Inhale two puffs twice daily; Rinse gargle and spit; Use with spacer   RISPERIDONE (RISPERDAL) 0.5 MG TABLET    TAKE 1 TABLET BY MOUTH TWICE A DAY   TDAP (ADACEL) 02-05-14.5 LF-MCG/0.5 INJECTION    Inject 0.5 mLs into the muscle once.   VITAMIN B-12 (CYANOCOBALAMIN) 1000 MCG TABLET    Take 1,000 mcg by mouth daily.   VITAMIN C (ASCORBIC ACID) 500 MG TABLET    Take 500 mg by mouth daily.  Modified Medications   No medications on file  Discontinued Medications   No medications on file     Physical Exam: Filed Vitals:   04/04/14 0957  BP: 160/72  Pulse: 61  Temp: 97.7 F (36.5 C)  TempSrc: Oral  Resp: 18  Height: 5\' 4"  (1.626 m)  Weight: 136 lb 6.4 oz (61.871 kg)  SpO2: 96%  Physical Exam  Constitutional: She is oriented to person, place, and time. She appears well-developed and well-nourished. No distress.  Cardiovascular: Normal rate and regular rhythm.   Midsystolic click audible;  Wearing compression hose, no edema with them in place  Pulmonary/Chest: Effort normal and breath sounds normal. She has no rales.  Abdominal: Soft. Bowel sounds are normal. She exhibits no distension and no mass. There is no tenderness. No hernia.  Musculoskeletal: Normal range of motion. She exhibits no tenderness.  Neurological: She is alert and oriented to person,  place, and time.  Skin: Skin is warm and dry.  Psychiatric: She has a normal mood and affect.     Labs reviewed: Basic Metabolic Panel:  Recent Labs  10/26/13 1156 11/04/13 1620 12/08/13 1137  NA 140 143 143  K 3.4* 3.9 3.5  CL 104  105 108  CO2 29 23 26   GLUCOSE 107* 112* 95  BUN 16 10 16   CREATININE 0.9 0.83 0.9  CALCIUM 9.5 9.4 9.6   Liver Function Tests:  Recent Labs  08/17/13 0836  AST 20  ALT 17  ALKPHOS 82  BILITOT 0.5  PROT 6.8  CBC:  Recent Labs  08/17/13 0836  WBC 8.1  NEUTROABS 3.6  HGB 12.3  HCT 36.6  MCV 94   Lipid Panel:  Recent Labs  08/17/13 0836  HDL 105  LDLCALC 73  TRIG 47  CHOLHDL 1.8   Assessment/Plan 1. Abdominal pain, left lower quadrant -suspect this may be flatus vs. Diverticular in etiology -has no fever or diarrhea so doubt diverticulitis--also denies eating precipitating foods--reinforced what can contribute to this  2. Glaucoma -cont to follow with ophthalmology--if we receive a records request from them, we will gladly share her records  3. Asthma, mild intermittent, uncomplicated -controlled with qvar and albuterol  4. Olfactory hallucinations -persist -unclear if she really takes the risperdal bid  -says this is still going on and does not accept that this is not real  5. Gastroesophageal reflux disease without esophagitis -stable with lansoprazole  6. Neuropathy of both feet -continues b12 supplement and says she has no numbness at this time -b12 was normal last time  7. Suprapubic pain, acute, unspecified laterality -check urine dipstick to rule out UTI as cause of her symptoms  Labs/tests ordered: urine dipstick  Next appt:  4 mos

## 2014-04-04 NOTE — Addendum Note (Signed)
Addended by: Eilene Ghazi on: 04/04/2014 11:37 AM   Modules accepted: Orders

## 2014-04-12 ENCOUNTER — Telehealth: Payer: Self-pay

## 2014-04-12 NOTE — Telephone Encounter (Signed)
Message left on triage voicemail by Katharine Look: patient with 2 dizzy spells back to back (each lasted about 5 minutes), please call with recommendations.  I called Katharine Look (patient's daughter), I was instructed to call Mrs.Barnstable directly at home number. Spoke with patient, patient with no other symptoms, no new medication changes, b/p today was 117/61, and  pulse was 60.   Dr.Reed please advise

## 2014-04-13 NOTE — Telephone Encounter (Signed)
Unclear what happened.  She needs to stand up slowly b/c her blood pressure may be dropping on standing.  Also please make sure that she is keeping her home at a suitable temperature in the hot weather and drinking enough water.

## 2014-04-13 NOTE — Telephone Encounter (Signed)
Patient stated that she is doing these recommendations. BP runs between 117-134/ 54-84. Advised patient to make an appointment if symptoms persist. Patient agreed.

## 2014-04-19 DIAGNOSIS — J45909 Unspecified asthma, uncomplicated: Secondary | ICD-10-CM | POA: Diagnosis not present

## 2014-04-19 DIAGNOSIS — J3089 Other allergic rhinitis: Secondary | ICD-10-CM | POA: Diagnosis not present

## 2014-04-20 DIAGNOSIS — H02409 Unspecified ptosis of unspecified eyelid: Secondary | ICD-10-CM | POA: Diagnosis not present

## 2014-04-20 DIAGNOSIS — H3554 Dystrophies primarily involving the retinal pigment epithelium: Secondary | ICD-10-CM | POA: Diagnosis not present

## 2014-04-20 DIAGNOSIS — H16229 Keratoconjunctivitis sicca, not specified as Sjogren's, unspecified eye: Secondary | ICD-10-CM | POA: Diagnosis not present

## 2014-04-20 DIAGNOSIS — H4011X0 Primary open-angle glaucoma, stage unspecified: Secondary | ICD-10-CM | POA: Diagnosis not present

## 2014-06-01 DIAGNOSIS — M19049 Primary osteoarthritis, unspecified hand: Secondary | ICD-10-CM | POA: Diagnosis not present

## 2014-06-09 ENCOUNTER — Emergency Department (HOSPITAL_COMMUNITY): Payer: Medicare Other

## 2014-06-09 ENCOUNTER — Encounter (HOSPITAL_COMMUNITY): Payer: Self-pay | Admitting: Emergency Medicine

## 2014-06-09 ENCOUNTER — Observation Stay (HOSPITAL_COMMUNITY)
Admission: EM | Admit: 2014-06-09 | Discharge: 2014-06-10 | Disposition: A | Discharge status: Home / Self Care | Payer: Medicare Other | Attending: Internal Medicine | Admitting: Internal Medicine

## 2014-06-09 DIAGNOSIS — I739 Peripheral vascular disease, unspecified: Secondary | ICD-10-CM | POA: Diagnosis not present

## 2014-06-09 DIAGNOSIS — Z79899 Other long term (current) drug therapy: Secondary | ICD-10-CM | POA: Insufficient documentation

## 2014-06-09 DIAGNOSIS — R442 Other hallucinations: Secondary | ICD-10-CM

## 2014-06-09 DIAGNOSIS — K649 Unspecified hemorrhoids: Secondary | ICD-10-CM | POA: Diagnosis not present

## 2014-06-09 DIAGNOSIS — R0789 Other chest pain: Secondary | ICD-10-CM | POA: Diagnosis not present

## 2014-06-09 DIAGNOSIS — J302 Other seasonal allergic rhinitis: Secondary | ICD-10-CM

## 2014-06-09 DIAGNOSIS — E876 Hypokalemia: Secondary | ICD-10-CM

## 2014-06-09 DIAGNOSIS — I10 Essential (primary) hypertension: Secondary | ICD-10-CM | POA: Insufficient documentation

## 2014-06-09 DIAGNOSIS — T502X5A Adverse effect of carbonic-anhydrase inhibitors, benzothiadiazides and other diuretics, initial encounter: Secondary | ICD-10-CM

## 2014-06-09 DIAGNOSIS — K219 Gastro-esophageal reflux disease without esophagitis: Secondary | ICD-10-CM | POA: Insufficient documentation

## 2014-06-09 DIAGNOSIS — J45901 Unspecified asthma with (acute) exacerbation: Secondary | ICD-10-CM | POA: Diagnosis not present

## 2014-06-09 DIAGNOSIS — R079 Chest pain, unspecified: Secondary | ICD-10-CM | POA: Diagnosis present

## 2014-06-09 DIAGNOSIS — F22 Delusional disorders: Secondary | ICD-10-CM

## 2014-06-09 DIAGNOSIS — IMO0002 Reserved for concepts with insufficient information to code with codable children: Secondary | ICD-10-CM | POA: Insufficient documentation

## 2014-06-09 DIAGNOSIS — R600 Localized edema: Secondary | ICD-10-CM

## 2014-06-09 DIAGNOSIS — R0602 Shortness of breath: Secondary | ICD-10-CM

## 2014-06-09 DIAGNOSIS — H409 Unspecified glaucoma: Secondary | ICD-10-CM | POA: Insufficient documentation

## 2014-06-09 DIAGNOSIS — M25471 Effusion, right ankle: Secondary | ICD-10-CM

## 2014-06-09 DIAGNOSIS — F419 Anxiety disorder, unspecified: Secondary | ICD-10-CM

## 2014-06-09 DIAGNOSIS — F411 Generalized anxiety disorder: Secondary | ICD-10-CM | POA: Diagnosis not present

## 2014-06-09 DIAGNOSIS — G5793 Unspecified mononeuropathy of bilateral lower limbs: Secondary | ICD-10-CM

## 2014-06-09 DIAGNOSIS — M1990 Unspecified osteoarthritis, unspecified site: Secondary | ICD-10-CM | POA: Insufficient documentation

## 2014-06-09 LAB — LIPID PANEL
CHOL/HDL RATIO: 1.9 ratio
CHOLESTEROL: 185 mg/dL (ref 0–200)
HDL: 98 mg/dL (ref >39)
LDL Cholesterol: 79 mg/dL (ref 0–99)
Triglycerides: 39 mg/dL (ref <150)
VLDL: 8 mg/dL (ref 0–40)

## 2014-06-09 LAB — CBC
HCT: 33 % — ABNORMAL LOW (ref 36.0–46.0)
HEMATOCRIT: 34.2 % — AB (ref 36.0–46.0)
HEMOGLOBIN: 11.7 g/dL — AB (ref 12.0–15.0)
Hemoglobin: 12.2 g/dL (ref 12.0–15.0)
MCH: 31.4 pg (ref 26.0–34.0)
MCH: 31.3 pg (ref 26.0–34.0)
MCHC: 35.7 g/dL (ref 30.0–36.0)
MCHC: 35.5 g/dL (ref 30.0–36.0)
MCV: 88.1 fL (ref 78.0–100.0)
MCV: 88.2 fL (ref 78.0–100.0)
PLATELETS: 206 10*3/uL (ref 150–400)
Platelets: 208 10*3/uL (ref 150–400)
RBC: 3.88 MIL/uL (ref 3.87–5.11)
RBC: 3.74 MIL/uL — ABNORMAL LOW (ref 3.87–5.11)
RDW: 14.3 % (ref 11.5–15.5)
RDW: 14.3 % (ref 11.5–15.5)
WBC: 6.4 10*3/uL (ref 4.0–10.5)
WBC: 5.3 10*3/uL (ref 4.0–10.5)

## 2014-06-09 LAB — BASIC METABOLIC PANEL
Anion gap: 14 (ref 5–15)
BUN: 16 mg/dL (ref 6–23)
CALCIUM: 9.4 mg/dL (ref 8.4–10.5)
CO2: 23 mEq/L (ref 19–32)
CREATININE: 0.85 mg/dL (ref 0.50–1.10)
Chloride: 105 mEq/L (ref 96–112)
GFR, EST AFRICAN AMERICAN: 69 mL/min — AB (ref >90)
GFR, EST NON AFRICAN AMERICAN: 60 mL/min — AB (ref >90)
GLUCOSE: 101 mg/dL — AB (ref 70–99)
Potassium: 4.2 mEq/L (ref 3.7–5.3)
Sodium: 142 mEq/L (ref 137–147)

## 2014-06-09 LAB — CREATININE, SERUM
Creatinine, Ser: 0.86 mg/dL (ref 0.50–1.10)
GFR calc Af Amer: 68 mL/min — ABNORMAL LOW (ref >90)
GFR, EST NON AFRICAN AMERICAN: 59 mL/min — AB (ref >90)

## 2014-06-09 LAB — I-STAT TROPONIN, ED: Troponin i, poc: 0.01 ng/mL (ref 0.00–0.08)

## 2014-06-09 LAB — PRO B NATRIURETIC PEPTIDE: Pro B Natriuretic peptide (BNP): 127.5 pg/mL (ref 0–450)

## 2014-06-09 LAB — TROPONIN I: Troponin I: <0.3 ng/mL (ref <0.30)

## 2014-06-09 MED ORDER — AMLODIPINE BESYLATE 5 MG PO TABS
5.0000 mg | ORAL_TABLET | Freq: Every day | ORAL | Status: DC
  Administered 2014-06-10: 5 mg via ORAL
  Filled 2014-06-09: qty 1

## 2014-06-09 MED ORDER — RISPERIDONE 0.5 MG PO TABS
0.5000 mg | ORAL_TABLET | Freq: Two times a day (BID) | ORAL | Status: DC
  Administered 2014-06-10 (×2): 0.5 mg via ORAL
  Filled 2014-06-09 (×3): qty 1

## 2014-06-09 MED ORDER — ALBUTEROL SULFATE HFA 108 (90 BASE) MCG/ACT IN AERS: 2.0000 | INHALATION_SPRAY | Freq: Four times a day (QID) | RESPIRATORY_TRACT | Status: DC | PRN

## 2014-06-09 MED ORDER — POTASSIUM CHLORIDE 20 MEQ/15ML (10%) PO LIQD
20.0000 meq | Freq: Every day | ORAL | Status: DC
  Administered 2014-06-10: 20 meq via ORAL
  Filled 2014-06-09: qty 15

## 2014-06-09 MED ORDER — SODIUM CHLORIDE 0.9 % IV SOLN
INTRAVENOUS | Status: DC
  Administered 2014-06-09: via INTRAVENOUS

## 2014-06-09 MED ORDER — BRINZOLAMIDE 1 % OP SUSP
1.0000 [drp] | Freq: Three times a day (TID) | OPHTHALMIC | Status: DC
  Administered 2014-06-10 (×2): 1 [drp] via OPHTHALMIC
  Filled 2014-06-09: qty 10

## 2014-06-09 MED ORDER — FLUTICASONE PROPIONATE 50 MCG/ACT NA SUSP
1.0000 | Freq: Every day | NASAL | Status: DC
  Filled 2014-06-09: qty 16

## 2014-06-09 MED ORDER — HYDROCORTISONE 2.5 % RE CREA
TOPICAL_CREAM | Freq: Two times a day (BID) | RECTAL | Status: DC
  Filled 2014-06-09: qty 28.35

## 2014-06-09 MED ORDER — VITAMIN B-12 1000 MCG PO TABS
1000.0000 ug | ORAL_TABLET | Freq: Every day | ORAL | Status: DC
  Administered 2014-06-10: 1000 ug via ORAL
  Filled 2014-06-09: qty 1

## 2014-06-09 MED ORDER — PANTOPRAZOLE SODIUM 40 MG PO TBEC
40.0000 mg | DELAYED_RELEASE_TABLET | Freq: Every day | ORAL | Status: DC
  Administered 2014-06-10: 40 mg via ORAL
  Filled 2014-06-09: qty 1

## 2014-06-09 MED ORDER — HEPARIN SODIUM (PORCINE) 5000 UNIT/ML IJ SOLN
5000.0000 [IU] | Freq: Three times a day (TID) | INTRAMUSCULAR | Status: DC
  Administered 2014-06-09 – 2014-06-10 (×2): 5000 [IU] via SUBCUTANEOUS
  Filled 2014-06-09 (×4): qty 1

## 2014-06-09 MED ORDER — ALBUTEROL SULFATE (2.5 MG/3ML) 0.083% IN NEBU: 2.5000 mg | INHALATION_SOLUTION | Freq: Four times a day (QID) | RESPIRATORY_TRACT | Status: DC | PRN

## 2014-06-09 MED ORDER — GI COCKTAIL ~~LOC~~
30.0000 mL | Freq: Once | ORAL | Status: AC
  Administered 2014-06-09: 30 mL via ORAL
  Filled 2014-06-09: qty 30

## 2014-06-09 MED ORDER — FLUTICASONE PROPIONATE HFA 44 MCG/ACT IN AERO
2.0000 | INHALATION_SPRAY | Freq: Two times a day (BID) | RESPIRATORY_TRACT | Status: DC
  Administered 2014-06-10: 2 via RESPIRATORY_TRACT
  Filled 2014-06-09: qty 10.6

## 2014-06-09 MED ORDER — VITAMIN C 500 MG PO TABS
500.0000 mg | ORAL_TABLET | Freq: Every day | ORAL | Status: DC
  Administered 2014-06-10: 500 mg via ORAL
  Filled 2014-06-09: qty 1

## 2014-06-09 NOTE — ED Provider Notes (Signed)
CSN: [id number]     Arrival date & time 06/09/14  1840 History   First MD Initiated Contact with Patient 06/09/14 1924     Chief Complaint  Patient presents with  . Shortness of Breath  . Chest Pain     (Consider location/radiation/quality/duration/timing/severity/associated sxs/prior Treatment) HPI Comments: 80 year old female presenting with a one-week history of intermittent chest "heaviness" with shortness of breath and chest pressure. The pain comes and goes. It lasts for about 5 minutes at a time. It radiates to her neck. Nothing makes it better or worse. Seemed to become more frequently at night. She is unable to say how may times a day it happens. She denies any diaphoresis, nausea, vomiting, cough or fever. She denies any cardiac history. She sees Dr. Radford Pax for her hypertension and had an echocardiogram earlier this year. She declined a stress test earlier this year. She denies having this chest pressure in the past. He is not having it currently. She is allergic aspirin stating that makes her wheeze.  The history is provided by the patient and the EMS personnel. The history is limited by the condition of the patient.    Past Medical History  Diagnosis Date  . Benign essential hypertension   . Osteoarthritis   . GERD (gastroesophageal reflux disease)   . Seasonal allergies   . Asthma   . Glaucoma   . PVD (peripheral vascular disease)   . Anxiety    Past Surgical History  Procedure Laterality Date  . Abdominal hysterectomy  1981  . Tonsillectomy  1953   Family History  Problem Relation Age of Onset  . Stroke Father    History  Substance Use Topics  . Smoking status: Never Smoker   . Smokeless tobacco: Not on file  . Alcohol Use: No   OB History   Grav Para Term Preterm Abortions TAB SAB Ect Mult Living                 Review of Systems  Constitutional: Negative for fever, activity change and appetite change.  HENT: Negative for congestion and rhinorrhea.    Respiratory: Positive for chest tightness and shortness of breath.   Cardiovascular: Positive for chest pain. Negative for leg swelling.  Gastrointestinal: Negative for nausea, vomiting and abdominal pain.  Genitourinary: Negative for dysuria, hematuria, vaginal bleeding and vaginal discharge.  Musculoskeletal: Negative for arthralgias and myalgias.  Skin: Negative for rash.  Neurological: Negative for dizziness, weakness and headaches.  A complete 10 system review of systems was obtained and all systems are negative except as noted in the HPI and PMH.      Allergies  Aspirin; Codeine; Iodine; Prednisone; and Sulfa antibiotics  Home Medications   Prior to Admission medications   Medication Sig Start Date End Date Taking? Authorizing Provider  bimatoprost (LUMIGAN) 0.01 % SOLN Place 1 drop into both eyes at bedtime.   Yes Historical Provider, MD  albuterol (PROVENTIL HFA;VENTOLIN HFA) 108 (90 BASE) MCG/ACT inhaler Inhale 2 puffs into the lungs every 6 (six) hours as needed for wheezing.    Historical Provider, MD  amLODipine (NORVASC) 5 MG tablet Take 1 tablet (5 mg total) by mouth daily. 01/10/14   Tiffany L Reed, DO  brinzolamide (AZOPT) 1 % ophthalmic suspension Place 1 drop into both eyes 3 (three) times daily.    Historical Provider, MD  Calcium Carbonate-Vitamin D (CALTRATE 600+D) 600-400 MG-UNIT per tablet Take 2 tablets by mouth daily.    Historical Provider, MD  Coenzyme Q10 (  COQ10) 50 MG CAPS Take one tablet by mouth once daily    Historical Provider, MD  fluticasone (FLONASE) 50 MCG/ACT nasal spray Place 1 spray into the nose daily.    Historical Provider, MD  lansoprazole (PREVACID) 30 MG capsule Take 1 capsule (30 mg total) by mouth daily. 05/28/13   Tiffany L Reed, DO  latanoprost (XALATAN) 0.005 % ophthalmic solution Place 1 drop into both eyes at bedtime.    Historical Provider, MD  loratadine (CLARITIN) 10 MG tablet Take 10 mg by mouth daily.    Historical Provider, MD   loteprednol (LOTEMAX) 0.5 % ophthalmic suspension Place 1 drop into the left eye daily.    Historical Provider, MD  Multiple Vitamins-Minerals (SENIOR MULTIVITAMIN PLUS) TABS Take by mouth.    Historical Provider, MD  Omega-3 Fatty Acids (FISH OIL) 1200 MG CPDR Take one tablet by mouth once daily    Historical Provider, MD  PATADAY 0.2 % SOLN Place 1 drop into the right eye as needed. 03/09/14   Historical Provider, MD  potassium chloride 20 MEQ/15ML (10%) solution Take 15 mLs (20 mEq total) by mouth daily. 12/08/13   Imogene Burn, PA-C  QVAR 80 MCG/ACT inhaler Inhale two puffs twice daily; Rinse gargle and spit; Use with spacer 05/12/13   Historical Provider, MD  risperiDONE (RISPERDAL) 0.5 MG tablet TAKE 1 TABLET BY MOUTH TWICE A DAY    Tiffany L Reed, DO  Tdap (ADACEL) 02-05-14.5 LF-MCG/0.5 injection Inject 0.5 mLs into the muscle once. 10/04/13   Tiffany L Reed, DO  vitamin B-12 (CYANOCOBALAMIN) 1000 MCG tablet Take 1,000 mcg by mouth daily.    Historical Provider, MD  vitamin C (ASCORBIC ACID) 500 MG tablet Take 500 mg by mouth daily.    Historical Provider, MD   BP 145/67  Pulse 64  Temp(Src) 98.2 F (36.8 C) (Oral)  Resp 13  SpO2 99% Physical Exam  Nursing note and vitals reviewed. Constitutional: She is oriented to person, place, and time. She appears well-developed and well-nourished. No distress.  HENT:  Head: Normocephalic and atraumatic.  Mouth/Throat: Oropharynx is clear and moist. No oropharyngeal exudate.  Eyes: Conjunctivae and EOM are normal. Pupils are equal, round, and reactive to light.  Neck: Normal range of motion. Neck supple.  No meningismus.  Cardiovascular: Normal rate, regular rhythm, normal heart sounds and intact distal pulses.   No murmur heard. Pulmonary/Chest: Effort normal and breath sounds normal. No respiratory distress.  Abdominal: Soft. There is no tenderness. There is no rebound and no guarding.  Musculoskeletal: Normal range of motion. She exhibits no  edema and no tenderness.  Neurological: She is alert and oriented to person, place, and time. No cranial nerve deficit. She exhibits normal muscle tone. Coordination normal.  No ataxia on finger to nose bilaterally. No pronator drift. 5/5 strength throughout. CN 2-12 intact. Negative Romberg. Equal grip strength. Sensation intact. Gait is normal.   Skin: Skin is warm.  Psychiatric: She has a normal mood and affect. Her behavior is normal.    ED Course  Procedures (including critical care time) Labs Review Labs Reviewed  CBC - Abnormal; Notable for the following:    HCT 34.2 (*)    All other components within normal limits  BASIC METABOLIC PANEL - Abnormal; Notable for the following:    Glucose, Bld 101 (*)    GFR calc non Af Amer 60 (*)    GFR calc Af Amer 69 (*)    All other components within normal limits  CBC - Abnormal; Notable for the following:    RBC 3.74 (*)    Hemoglobin 11.7 (*)    HCT 33.0 (*)    All other components within normal limits  PRO B NATRIURETIC PEPTIDE  CREATININE, SERUM  COMPREHENSIVE METABOLIC PANEL  CBC WITH DIFFERENTIAL  TSH  TROPONIN I  TROPONIN I  TROPONIN I  HEMOGLOBIN A1C  LIPID PANEL  I-STAT TROPOININ, ED    Imaging Review Dg Chest 2 View  06/09/2014   CLINICAL DATA:  Chest pain.  EXAM: CHEST  2 VIEW  COMPARISON:  March 15, 2011.  FINDINGS: Stable mild cardiomegaly. No pneumothorax or pleural effusion is noted. Both lungs are clear. The visualized skeletal structures are unremarkable.  IMPRESSION: No acute cardiopulmonary abnormality seen.   Electronically Signed   By: Sabino Dick M.D.   On: 06/09/2014 21:40     EKG Interpretation   Date/Time:  Thursday June 09 2014 18:54:51 EDT Ventricular Rate:  74 PR Interval:  226 QRS Duration: 130 QT Interval:  414 QTC Calculation: 459 R Axis:   90 Text Interpretation:  Sinus rhythm with 1st degree A-V block Right atrial  enlargement Right bundle branch block Abnormal ECG Rate faster  Confirmed  by Chelsea [phone]) on 06/09/2014 7:44:55 PM      MDM   Final diagnoses:  Hemorrhoids, unspecified hemorrhoid type   One-week history of intermittent shortness of breath with chest pressure.   EKG is unchanged from previous. Chest x-ray is negative.  HEART score 5. Patient declined stress test by her PCP.   She is chest pain-free in ED. Patient not given aspirin as she says this causes wheezing. Given her age and risk factors, we'll admit for rule out. Discussed with Dr. Ernestina Patches. Cardiology consult requested as well.   Ezequiel Essex, MD 06/09/14 [phone]

## 2014-06-09 NOTE — ED Notes (Signed)
Pt c/o SOB at night x 3 days with some chest pressure and SOB today; pt denies pain at present

## 2014-06-09 NOTE — H&P (Signed)
Hospitalist Admission History and Physical  Patient name: [name] [name] Medical record number: [id number] Date of birth: 1924-08-27 Age: 80 y.o. Gender: female  Primary Care Provider: Hollace Kinnier, DO  Chief Complaint: chest pain  History of Present Illness:This is a 80 y.o. year old female with significant past medical history of asthma, GERD, HTN  presenting with chest pain. Pt fairly poor historian. However, pt reports intermittent recurrent chest pain over past 2-3 months. Central chest pain without radiation. Mild in nature. No assd nausea or diaphoresis. Pt has been seen cardiology per pt. Was seen by cardiology in may of this year. A stress test was offered. However, pt declined. Has had no acute worsening in sxs, however they have been persistent. Mild SOB. No wheezing in setting of asthma. Presented to the ER today hemodynamically stable. Trop neg x1. EKG w/ stable RBBB. Was chest pain free in ER. ASA deferred as pt reports wheezing with medication per EDP. Pt does report new L arm weakness over past 2-3 days. No hemiparesis or confusion. Denies any prior hx/o stroke in the past.  Heart Score 5 per EDP   Assessment and Plan: [name] is a 80 y.o. year old female presenting with chest pain   Active Problems:   Chest pain   1- Chest Pain  -subacute issue -no active chest pain  -fairly mixed sxs  -cycle CEs  -risk stratification labs -cards c/s by EDP-f/u recs -inpt vs. outpt stress test  -telemetry bed -cont to follow closely   2-L arm weakness -new complaint today  -no significant weakness on exam today -check MRI brain  -continue to follow -Neuro c/s as clinically indicate  3-Asthma -stable -no resp distress/increased WOB/wheezing -cont home regimen  4-HTN -stable  -cont home regimen   4-Anxiety  -stable  -cont risperdal   FEN/GI: heart healthy diet  Prophylaxis: sub q heparin  Disposition: pending further evaluation  Code Status:Full  Code   Patient Active Problem List   Diagnosis Date Noted  . Chest pain 06/09/2014  . Asthma, mild intermittent 04/04/2014  . Gastroesophageal reflux disease without esophagitis 04/04/2014  . Neuropathy of both feet 04/04/2014  . Right ankle swelling 01/03/2014  . Diuretic-induced hypokalemia 01/03/2014  . Olfactory hallucinations 11/23/2013  . SOB (shortness of breath) 10/26/2013  . Edema extremities 10/26/2013  . Paranoid type delusional disorder 08/20/2013  . Benign essential hypertension   . Osteoarthritis   . Seasonal allergies   . Asthma   . Glaucoma   . PVD (peripheral vascular disease)   . Anxiety    Past Medical History: Past Medical History  Diagnosis Date  . Benign essential hypertension   . Osteoarthritis   . GERD (gastroesophageal reflux disease)   . Seasonal allergies   . Asthma   . Glaucoma   . PVD (peripheral vascular disease)   . Anxiety     Past Surgical History: Past Surgical History  Procedure Laterality Date  . Abdominal hysterectomy  1981  . Tonsillectomy  1953    Social History: History   Social History  . Marital Status: Married    Spouse Name: N/A    Number of Children: N/A  . Years of Education: N/A   Social History Main Topics  . Smoking status: Never Smoker   . Smokeless tobacco: None  . Alcohol Use: No  . Drug Use: No  . Sexual Activity: None   Other Topics Concern  . 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 Stroke Father     Allergies: Allergies  Allergen Reactions  . Aspirin   . Codeine   . Iodine     Pt is not aware of this allergy, does not recall much info....//a.c.  . Prednisone   . Sulfa Antibiotics     Current Facility-Administered Medications  Medication Dose Route Frequency Provider Last Rate Last Dose  . 0.9 %  sodium chloride infusion    Intravenous Continuous Shanda Howells, MD      . albuterol (PROVENTIL HFA;VENTOLIN HFA) 108 (90 BASE) MCG/ACT inhaler 2 puff  2 puff Inhalation Q6H PRN Shanda Howells, MD      . Derrill Memo ON 06/10/2014] amLODipine (NORVASC) tablet 5 mg  5 mg Oral Daily Shanda Howells, MD      . brinzolamide (AZOPT) 1 % ophthalmic suspension 1 drop  1 drop Both Eyes TID Shanda Howells, MD      . Derrill Memo ON 06/10/2014] fluticasone (FLONASE) 50 MCG/ACT nasal spray 1 spray  1 spray Each Nare Daily Shanda Howells, MD      . fluticasone (FLOVENT HFA) 44 MCG/ACT inhaler 2 puff  2 puff Inhalation BID Shanda Howells, MD      . heparin injection 5,000 Units  5,000 Units Subcutaneous 3 times per day Shanda Howells, MD      . hydrocortisone (ANUSOL-HC) 2.5 % rectal cream   Rectal BID Tiffany L Reed, DO      . hydrocortisone (ANUSOL-HC) 2.5 % rectal cream   Rectal BID Shanda Howells, MD      . Derrill Memo ON 06/10/2014] pantoprazole (PROTONIX) EC tablet 40 mg  40 mg Oral Daily Shanda Howells, MD      . Derrill Memo ON 06/10/2014] potassium chloride 20 MEQ/15ML (10%) liquid 20 mEq  20 mEq Oral Daily Shanda Howells, MD      . risperiDONE (RISPERDAL) tablet 0.5 mg  0.5 mg Oral BID Shanda Howells, MD      . Derrill Memo ON 06/10/2014] vitamin B-12 (CYANOCOBALAMIN) tablet 1,000 mcg  1,000 mcg Oral Daily Shanda Howells, MD      . Derrill Memo ON 06/10/2014] vitamin C (ASCORBIC ACID) tablet 500 mg  500 mg Oral Daily Shanda Howells, MD       Current Outpatient Prescriptions  Medication Sig Dispense Refill  . albuterol (PROVENTIL HFA;VENTOLIN HFA) 108 (90 BASE) MCG/ACT inhaler Inhale 2 puffs into the lungs every 6 (six) hours as needed for wheezing.      Marland Kitchen amLODipine (NORVASC) 5 MG tablet Take 1 tablet (5 mg total) by mouth daily.  30 tablet  5  . brinzolamide (AZOPT) 1 % ophthalmic suspension Place 1 drop into both eyes 3 (three) times daily.      . Calcium Carbonate-Vitamin D (CALTRATE 600+D) 600-400 MG-UNIT per tablet Take 2 tablets by mouth daily.      . Coenzyme Q10 (COQ10) 50 MG  CAPS Take one tablet by mouth once daily      . fluticasone (FLONASE) 50 MCG/ACT nasal spray Place 1 spray into the nose daily.      . lansoprazole (PREVACID) 30 MG capsule Take 1 capsule (30 mg total) by mouth daily.  30 capsule  3  . latanoprost (XALATAN) 0.005 % ophthalmic solution Place 1 drop into both eyes at bedtime.      Marland Kitchen loratadine (CLARITIN) 10 MG tablet Take 10 mg by mouth daily.      Marland Kitchen  loteprednol (LOTEMAX) 0.5 % ophthalmic suspension Place 1 drop into the left eye daily.      . Multiple Vitamins-Minerals (SENIOR MULTIVITAMIN PLUS) TABS Take by mouth.      . Omega-3 Fatty Acids (FISH OIL) 1200 MG CPDR Take one tablet by mouth once daily      . PATADAY 0.2 % SOLN Place 1 drop into the right eye as needed.      . potassium chloride 20 MEQ/15ML (10%) solution Take 15 mLs (20 mEq total) by mouth daily.  500 mL  1  . QVAR 80 MCG/ACT inhaler Inhale two puffs twice daily; Rinse gargle and spit; Use with spacer      . risperiDONE (RISPERDAL) 0.5 MG tablet TAKE 1 TABLET BY MOUTH TWICE A DAY  60 tablet  5  . Tdap (ADACEL) 02-05-14.5 LF-MCG/0.5 injection Inject 0.5 mLs into the muscle once.  0.5 mL  0  . vitamin B-12 (CYANOCOBALAMIN) 1000 MCG tablet Take 1,000 mcg by mouth daily.      . vitamin C (ASCORBIC ACID) 500 MG tablet Take 500 mg by mouth daily.       Review Of Systems: 12 point ROS negative except as noted above in HPI.  Physical Exam: Filed Vitals:   06/09/14 2000  BP: 145/67  Pulse: 64  Temp:   Resp: 13    General: alert and cooperative HEENT: PERRLA and extra ocular movement intact Heart: S1, S2 normal, no murmur, rub or gallop, regular rate and rhythm Lungs: clear to auscultation, no wheezes or rales and unlabored breathing Abdomen: abdomen is soft without significant tenderness, masses, organomegaly or guarding Extremities: extremities normal, atraumatic, no cyanosis or edema Skin:no rashes, no ecchymoses Neurology: normal without focal findings  Labs and  Imaging: Lab Results  Component Value Date/Time   NA 142 06/09/2014  7:44 PM   NA 143 11/04/2013  4:20 PM   K 4.2 06/09/2014  7:44 PM   CL 105 06/09/2014  7:44 PM   CO2 23 06/09/2014  7:44 PM   BUN 16 06/09/2014  7:44 PM   BUN 10 11/04/2013  4:20 PM   CREATININE 0.85 06/09/2014  7:44 PM   GLUCOSE 101* 06/09/2014  7:44 PM   GLUCOSE 112* 11/04/2013  4:20 PM   Lab Results  Component Value Date   WBC 6.4 06/09/2014   HGB 12.2 06/09/2014   HCT 34.2* 06/09/2014   MCV 88.1 06/09/2014   PLT 206 06/09/2014    Dg Chest 2 View  06/09/2014   CLINICAL DATA:  Chest pain.  EXAM: CHEST  2 VIEW  COMPARISON:  March 15, 2011.  FINDINGS: Stable mild cardiomegaly. No pneumothorax or pleural effusion is noted. Both lungs are clear. The visualized skeletal structures are unremarkable.  IMPRESSION: No acute cardiopulmonary abnormality seen.   Electronically Signed   By: Sabino Dick M.D.   On: 06/09/2014 21:40           Shanda Howells MD  Pager: [phone]

## 2014-06-09 NOTE — Consult Note (Cosign Needed)
Referring Physician: Ezequiel Essex, MD Primary Physician: Yvette Rack, DO Primary Cardiologist: Fransico Him, MD Reason for Consultation: Chest pain, LE edema   HPI: [name] [name] is a 68F with HTN and PVD admitted with chest pain.  The pain is dull, chest pressure in the center of her chest and does not radiate.  There is associated pain in the L side of her neck.  She denies SOB, nausea, vomiting or diaphoresis.  Symptoms have been ongoing for two weeks and getting progressively worse.  This afternoon she developed chest pressure after doing house work that would not subside, so she decided to present to the ED for evaluation.  [name] [name] was evaluated by Dr. Radford Pax 02/2014 for SOB and LE edema.  At that time she had an echo that showed normal EF and grade 1 diastolic dysfunction.  Dr. Radford Pax recommended a nuclear stress, however the patient declined.  She remembers having a stress test many years ago and she developed severe chest pain after the procedure.  She is afraid of recurrent symptoms.  Per the patient, this stress test was negative for coronary disease.  At the time of her visit with Dr. Radford Pax she also complained of LE edema that improved with an increased dose of lasix.  She subsequently stopped taking lasix because it "made me sick."  She cannot articulate how it made her sick.  The edema has not recurred.  [name] [name] is a non-smoker and reports a strong family history of CAD and strokes.   Review of Systems:     Cardiac Review of Systems: {Y] = yes [ ]  = no  Chest Pain [ x   ]  Resting SOB [   ] Exertional SOB  [x  ]  Orthopnea [  ]   Pedal Edema [ x  ]    Palpitations [  ] Syncope  [  ]   Presyncope [   ]  General Review of Systems: [Y] = yes [  ]=no Constitional: recent weight change [  ]; anorexia [x  ]; fatigue [  ]; nausea [  ]; night sweats [  ]; fever [  ]; or chills [x  ];                                                                     Eyes : blurred vision [   ]; diplopia [   ]; vision changes [  ];  Amaurosis fugax[  ]; Resp: cough [  ];  wheezing[ x ];  hemoptysis[  ];  PND [  ];  GI:  gallstones[  ], vomiting[  ];  dysphagia[  ]; melena[  ];  hematochezia [  ]; heartburn[  ];   GU: kidney stones [  ]; hematuria[  ];   dysuria [  ];  nocturia[  ]; incontinence [  ];             Skin: rash, swelling[  ];, hair loss[  ];  peripheral edema[  ];  or itching[  ]; Musculosketetal: myalgias[  ];  joint swelling[  ];  joint erythema[  ];  joint pain[  ];  back pain[  ];  Heme/Lymph: bruising[  ];  bleeding[  ];  anemia[  ];  Neuro: TIA[  ];  headaches[  ];  stroke[  ];  vertigo[  ];  seizures[  ];   paresthesias[  ];  difficulty walking[  ];  Psych:depression[  ]; anxiety[  ];  Endocrine: diabetes[  ];  thyroid dysfunction[  ];  Other:  Past Medical History  Diagnosis Date  . Benign essential hypertension   . Osteoarthritis   . GERD (gastroesophageal reflux disease)   . Seasonal allergies   . Asthma   . Glaucoma   . PVD (peripheral vascular disease)   . Anxiety       Medication List    ASK your doctor about these medications       albuterol 108 (90 BASE) MCG/ACT inhaler  Commonly known as:  PROVENTIL HFA;VENTOLIN HFA  Inhale 2 puffs into the lungs every 6 (six) hours as needed for wheezing.     amLODipine 5 MG tablet  Commonly known as:  NORVASC  Take 1 tablet (5 mg total) by mouth daily.     brinzolamide 1 % ophthalmic suspension  Commonly known as:  AZOPT  Place 1 drop into both eyes 3 (three) times daily.     CALTRATE 600+D 600-400 MG-UNIT per tablet  Generic drug:  Calcium Carbonate-Vitamin D  Take 2 tablets by mouth daily.     CoQ10 50 MG Caps  Take one tablet by mouth once daily     Fish Oil 1200 MG Cpdr  Take one tablet by mouth once daily     fluticasone 50 MCG/ACT nasal spray  Commonly known as:  FLONASE  Place 1 spray into the nose daily.     lansoprazole 30 MG capsule  Commonly known as:  PREVACID  Take 1  capsule (30 mg total) by mouth daily.     latanoprost 0.005 % ophthalmic solution  Commonly known as:  XALATAN  Place 1 drop into both eyes at bedtime.     loratadine 10 MG tablet  Commonly known as:  CLARITIN  Take 10 mg by mouth daily.     loteprednol 0.5 % ophthalmic suspension  Commonly known as:  LOTEMAX  Place 1 drop into the left eye daily.     PATADAY 0.2 % Soln  Generic drug:  Olopatadine HCl  Place 1 drop into the right eye as needed.     potassium chloride 20 MEQ/15ML (10%) solution  Take 15 mLs (20 mEq total) by mouth daily.     QVAR 80 MCG/ACT inhaler  Generic drug:  beclomethasone  Inhale two puffs twice daily; Rinse gargle and spit; Use with spacer     risperiDONE 0.5 MG tablet  Commonly known as:  RISPERDAL  TAKE 1 TABLET BY MOUTH TWICE A DAY     SENIOR MULTIVITAMIN PLUS Tabs  Take by mouth.     Tdap 02-05-14.5 LF-MCG/0.5 injection  Commonly known as:  ADACEL  Inject 0.5 mLs into the muscle once.     vitamin B-12 1000 MCG tablet  Commonly known as:  CYANOCOBALAMIN  Take 1,000 mcg by mouth daily.     vitamin C 500 MG tablet  Commonly known as:  ASCORBIC ACID  Take 500 mg by mouth daily.          Allergies  Allergen Reactions  . Aspirin   . Codeine   . Iodine     Pt is not aware of this allergy, does not recall much info....//a.c.  . Prednisone   . Sulfa Antibiotics  History   Social History  . Marital Status: Married    Spouse Name: N/A    Number of Children: N/A  . Years of Education: N/A   Occupational History  . Not on file.   Social History Main Topics  . Smoking status: Never Smoker   . Smokeless tobacco: Not on file  . Alcohol Use: No  . Drug Use: No  . Sexual Activity: Not on file   Other Topics Concern  . 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 Stroke Father      PHYSICAL EXAM: Filed Vitals:   06/09/14 2000  BP: 145/67  Pulse: 64  Temp:   Resp: 13    No intake or output data in the 24 hours ending 06/09/14 2305  General:  Well appearing. No respiratory difficulty HEENT: normal Neck: supple. no JVD. Carotids 2+ bilat; no bruits. No lymphadenopathy or thryomegaly appreciated. Cor: PMI nondisplaced. Regular rate & rhythm. No rubs, gallops or murmurs. Lungs: CTAB Abdomen: soft, nontender, nondistended. No hepatosplenomegaly. No bruits or masses. Good bowel sounds. Extremities: no cyanosis, clubbing, rash, edema Neuro: alert & oriented x 3, cranial nerves grossly intact. moves all 4 extremities w/o difficulty. Affect pleasant.   Results for orders placed during the hospital encounter of 06/09/14 (from the past 24 hour(s))  CBC     Status: Abnormal   Collection Time    06/09/14  7:44 PM      Result Value Ref Range   WBC 6.4  4.0 - 10.5 K/uL   RBC 3.88  3.87 - 5.11 MIL/uL   Hemoglobin 12.2  12.0 - 15.0 g/dL   HCT 34.2 (*) 36.0 - 46.0 %   MCV 88.1  78.0 - 100.0 fL   MCH 31.4  26.0 - 34.0 pg   MCHC 35.7  30.0 - 36.0 g/dL   RDW 14.3  11.5 - 15.5 %   Platelets 206  150 - 400 K/uL  PRO B NATRIURETIC PEPTIDE     Status: None   Collection Time    06/09/14  7:44 PM      Result Value Ref Range   Pro B Natriuretic peptide (BNP) 127.5  0 - 450 pg/mL  BASIC METABOLIC PANEL     Status: Abnormal   Collection Time    06/09/14  7:44 PM      Result Value Ref Range   Sodium 142  137 - 147 mEq/L   Potassium 4.2  3.7 - 5.3 mEq/L   Chloride 105  96 - 112 mEq/L   CO2 23  19 - 32 mEq/L   Glucose, Bld 101 (*) 70 - 99 mg/dL   BUN 16  6 - 23 mg/dL   Creatinine, Ser 0.85  0.50 - 1.10 mg/dL   Calcium 9.4  8.4 - 10.5 mg/dL   GFR calc non Af Amer 60 (*) >90 mL/min   GFR calc Af Amer 69 (*) >90 mL/min   Anion gap 14  5 - 15  I-STAT TROPOININ, ED     Status: None   Collection Time    06/09/14  7:53 PM      Result Value Ref Range   Troponin i, poc  0.01  0.00 - 0.08 ng/mL   Comment 3            Dg  Chest 2 View  06/09/2014   CLINICAL DATA:  Chest pain.  EXAM: CHEST  2 VIEW  COMPARISON:  March 15, 2011.  FINDINGS: Stable mild cardiomegaly. No pneumothorax or pleural effusion is noted. Both lungs are clear. The visualized skeletal structures are unremarkable.  IMPRESSION: No acute cardiopulmonary abnormality seen.   Electronically Signed   By: Sabino Dick M.D.   On: 06/09/2014 21:40    EKG: Sinus rhythm rate 74.  First degree AV block.  RBBB.  R atrial enlargement.  Unchanged from 10/04/13.  ASSESSMENT: 63F with HTN and PVD admitted with chest pain.  Her chest pain is concerning for angina.  A pharmacologic stress would be the best way to evaluate her symptoms.  The risks and benefits were discussed with the patient and she will decide what she wants to do overnight.  If she decides not to pursue a stress, she states that she is not interested in cardiac catheterization.  This brings to question whether doing a stress would be helpful, as she is unwilling to pursue the next step.  Overall, stress testing still seems to be a helpful next step, as treating her presumptively for CAD with statins, beta blocker, ACE-I, etc is not without risk.  This is especially true, as she has an aspirin allergy and would need to be on a P2Y12 agent.   PLAN/DISCUSSION: - Patient to decide whether she will pursue pharmacologic stress - Consider trial of GI cocktail/PPI, as patient thinks this could be GERD - If patient declines stress, would treat presumptively for CAD with Plavix 75mg  daily, switch amlodipine to metoprolol, start atorvastatin 40 given her age and excellent lipid panel 08/2013 - Too unstable for exercise treadmill - Agree with cycling cardiac enzymes

## 2014-06-09 NOTE — ED Notes (Signed)
Phlebotomy at bedside.

## 2014-06-10 ENCOUNTER — Observation Stay (HOSPITAL_COMMUNITY): Payer: Medicare Other

## 2014-06-10 DIAGNOSIS — K649 Unspecified hemorrhoids: Secondary | ICD-10-CM

## 2014-06-10 DIAGNOSIS — R0789 Other chest pain: Secondary | ICD-10-CM | POA: Diagnosis not present

## 2014-06-10 DIAGNOSIS — R079 Chest pain, unspecified: Secondary | ICD-10-CM

## 2014-06-10 DIAGNOSIS — G8190 Hemiplegia, unspecified affecting unspecified side: Secondary | ICD-10-CM | POA: Diagnosis not present

## 2014-06-10 LAB — CBC WITH DIFFERENTIAL/PLATELET
BASOS ABS: 0 10*3/uL (ref 0.0–0.1)
BASOS PCT: 1 % (ref 0–1)
EOS ABS: 0.1 10*3/uL (ref 0.0–0.7)
Eosinophils Relative: 3 % (ref 0–5)
HEMATOCRIT: 30.3 % — AB (ref 36.0–46.0)
Hemoglobin: 10.5 g/dL — ABNORMAL LOW (ref 12.0–15.0)
Lymphocytes Relative: 38 % (ref 12–46)
Lymphs Abs: 1.7 10*3/uL (ref 0.7–4.0)
MCH: 30.6 pg (ref 26.0–34.0)
MCHC: 34.7 g/dL (ref 30.0–36.0)
MCV: 88.3 fL (ref 78.0–100.0)
MONO ABS: 0.6 10*3/uL (ref 0.1–1.0)
Monocytes Relative: 14 % — ABNORMAL HIGH (ref 3–12)
Neutro Abs: 2 10*3/uL (ref 1.7–7.7)
Neutrophils Relative %: 44 % (ref 43–77)
PLATELETS: 189 10*3/uL (ref 150–400)
RBC: 3.43 MIL/uL — ABNORMAL LOW (ref 3.87–5.11)
RDW: 14.5 % (ref 11.5–15.5)
WBC: 4.4 10*3/uL (ref 4.0–10.5)

## 2014-06-10 LAB — COMPREHENSIVE METABOLIC PANEL
ALBUMIN: 2.8 g/dL — AB (ref 3.5–5.2)
ALK PHOS: 55 U/L (ref 39–117)
ALT: 9 U/L (ref 0–35)
AST: 13 U/L (ref 0–37)
Anion gap: 9 (ref 5–15)
BUN: 17 mg/dL (ref 6–23)
CHLORIDE: 108 meq/L (ref 96–112)
CO2: 25 mEq/L (ref 19–32)
CREATININE: 0.92 mg/dL (ref 0.50–1.10)
Calcium: 8.6 mg/dL (ref 8.4–10.5)
GFR calc Af Amer: 63 mL/min — ABNORMAL LOW (ref >90)
GFR calc non Af Amer: 54 mL/min — ABNORMAL LOW (ref >90)
Glucose, Bld: 86 mg/dL (ref 70–99)
POTASSIUM: 3.9 meq/L (ref 3.7–5.3)
Sodium: 142 mEq/L (ref 137–147)
Total Bilirubin: 0.5 mg/dL (ref 0.3–1.2)
Total Protein: 5.6 g/dL — ABNORMAL LOW (ref 6.0–8.3)

## 2014-06-10 LAB — HEMOGLOBIN A1C
Hgb A1c MFr Bld: 5.7 % — ABNORMAL HIGH (ref <5.7)
Mean Plasma Glucose: 117 mg/dL — ABNORMAL HIGH (ref <117)

## 2014-06-10 LAB — TROPONIN I
Troponin I: <0.3 ng/mL (ref <0.30)
Troponin I: <0.3 ng/mL (ref <0.30)

## 2014-06-10 LAB — TSH: TSH: 2.51 u[IU]/mL (ref 0.350–4.500)

## 2014-06-10 MED ORDER — PNEUMOCOCCAL VAC POLYVALENT 25 MCG/0.5ML IJ INJ: 0.5000 mL | INJECTION | INTRAMUSCULAR | Status: DC

## 2014-06-10 MED ORDER — NITROGLYCERIN 0.4 MG SL SUBL: 0.4000 mg | SUBLINGUAL_TABLET | SUBLINGUAL | Status: DC | PRN

## 2014-06-10 NOTE — Progress Notes (Signed)
[name], female   DOB: 31-Oct-1923, 80 y.o.   MRN: [id number]    Subjective:  Denies SSCP, palpitations or Dyspnea   Objective:  Filed Vitals:   06/09/14 2330 06/10/14 0200 06/10/14 0400 06/10/14 0600  BP: 163/70 120/65 135/70 137/78  Pulse: 71 66 62 59  Temp: 98 F (36.7 C) 97.8 F (36.6 C) 97.5 F (36.4 C) 98.2 F (36.8 C)  TempSrc: Oral Oral Oral Oral  Resp: 16 18 18 18   Height: 5\' 2"  (1.575 m)     Weight: 132 lb (59.875 kg)     SpO2: 100% 100% 100% 100%    Intake/Output from previous day:  Intake/Output Summary (Last 24 hours) at 06/10/14 3875 Last data filed at 06/10/14 0105  Gross per 24 hour  Intake    360 ml  Output    200 ml  Net    160 ml    Physical Exam: Affect appropriate Healthy:  appears stated age HEENT poor vision  Neck supple with no adenopathy JVP normal no bruits no thyromegaly Lungs clear with no wheezing and good diaphragmatic motion Heart:  S1/S2 no murmur, no rub, gallop or click PMI normal Abdomen: benighn, BS positve, no tenderness, no AAA no bruit.  No HSM or HJR Distal pulses intact with no bruits No edema Neuro non-focal Skin warm and dry No muscular weakness   Lab Results: Basic Metabolic Panel:  Recent Labs  06/09/14 1944 06/09/14 2258 06/10/14 0441  NA 142  --  142  K 4.2  --  3.9  CL 105  --  108  CO2 23  --  25  GLUCOSE 101*  --  86  BUN 16  --  17  CREATININE 0.85 0.86 0.92  CALCIUM 9.4  --  8.6   Liver Function Tests:  Recent Labs  06/10/14 0441  AST 13  ALT 9  ALKPHOS 55  BILITOT 0.5  PROT 5.6*  ALBUMIN 2.8*   CBC:  Recent Labs  06/09/14 2258 06/10/14 0441  WBC 5.3 4.4  NEUTROABS  --  2.0  HGB 11.7* 10.5*  HCT 33.0* 30.3*  MCV 88.2 88.3  PLT 208 189   Cardiac Enzymes:  Recent Labs  06/09/14 2258 06/10/14 0441  TROPONINI <0.30 <0.30   Fasting Lipid Panel:  Recent Labs  06/09/14 2258  CHOL 185  HDL 98  LDLCALC 79  TRIG 39  CHOLHDL 1.9   Thyroid Function  Tests:  Recent Labs  06/09/14 2357  TSH 2.510    Imaging: Dg Chest 2 View  06/09/2014   CLINICAL DATA:  Chest pain.  EXAM: CHEST  2 VIEW  COMPARISON:  March 15, 2011.  FINDINGS: Stable mild cardiomegaly. No pneumothorax or pleural effusion is noted. Both lungs are clear. The visualized skeletal structures are unremarkable.  IMPRESSION: No acute cardiopulmonary abnormality seen.   Electronically Signed   By: Sabino Dick M.D.   On: 06/09/2014 21:40    Cardiac Studies:  ECG:   NSR no acute ST changes    Telemetry:  NSR no arrhythmia  Echo:   Medications:   . amLODipine  5 mg Oral Daily  . brinzolamide  1 drop Both Eyes TID  . fluticasone  1 spray Each Nare Daily  . fluticasone  2 puff Inhalation BID  . heparin  5,000 Units Subcutaneous 3 times per day  . hydrocortisone   Rectal BID  . pantoprazole  40 mg Oral Daily  . [START ON 06/11/2014] pneumococcal 23 valent  vaccine  0.5 mL Intramuscular Tomorrow-1000  . potassium chloride  20 mEq Oral Daily  . risperiDONE  0.5 mg Oral BID  . vitamin B-12  1,000 mcg Oral Daily  . vitamin C  500 mg Oral Daily     . sodium chloride 75 mL/hr at 06/09/14 2333    Assessment/Plan:  Chest Pain:  Again offered patient Lexiscan myovue  She currently refuses  Seems like she had a bad experience with previous stress test.  However when I asked her what it was it was a few seconds of sharp chest pain after she got home from hospital.  If she refuses scan would d/c latter today for outpatient f/u Dr Fransico Him HTN-  Dr Blenda Mounts note indicates stopping norvasc and using plavix and beta blocker.  She did not write for this and I would not change home meds at this time  Would make sure she has nitro.   GERD:  Continue pantoprazole   Jenkins Rouge 06/10/2014, 8:38 AM

## 2014-06-10 NOTE — Progress Notes (Signed)
Chaplain received a spiritual consult referral for pt. Chaplin visited pt. Who is being discharged today. Chaplain provided prayer and al listening ear to pt.   Charyl Dancer  06/10/14 1100  Clinical Encounter Type  Visited With Patient  Visit Type Spiritual support  Referral From Nurse  Consult/Referral To Chaplain  Spiritual Encounters  Spiritual Needs Prayer  Stress Factors  Patient Stress Factors None identified  Family Stress Factors None identified

## 2014-06-10 NOTE — Discharge Summary (Addendum)
Physician Discharge Summary  [name] [name] MRN: [id number] DOB/AGE: [birth year] 80 y.o.  PCP: Hollace Kinnier, DO   Admit date: 06/09/2014 Discharge date: 06/10/2014  Discharge Diagnoses:   :   Chest pain-patient declined stress test  Benign essential hypertension  .  Osteoarthritis  .  GERD (gastroesophageal reflux disease)  .  Seasonal allergies  .  Asthma  .  Glaucoma  .  PVD (peripheral vascular disease)  .  Anxiety    Follow up recommendations Followup with Dr Fransico Him Follow up with PCP in 5-7 days      Medication List    STOP taking these medications       Tdap 02-05-14.5 LF-MCG/0.5 injection  Commonly known as:  ADACEL      TAKE these medications       albuterol 108 (90 BASE) MCG/ACT inhaler  Commonly known as:  PROVENTIL HFA;VENTOLIN HFA  Inhale 2 puffs into the lungs every 6 (six) hours as needed for wheezing.     amLODipine 5 MG tablet  Commonly known as:  NORVASC  Take 1 tablet (5 mg total) by mouth daily.     bimatoprost 0.01 % Soln  Commonly known as:  LUMIGAN  Place 1 drop into both eyes at bedtime.     brinzolamide 1 % ophthalmic suspension  Commonly known as:  AZOPT  Place 1 drop into both eyes 3 (three) times daily.     CALTRATE 600+D 600-400 MG-UNIT per tablet  Generic drug:  Calcium Carbonate-Vitamin D  Take 2 tablets by mouth daily.     CoQ10 50 MG Caps  Take one tablet by mouth once daily     Fish Oil 1200 MG Cpdr  Take one tablet by mouth once daily     fluticasone 50 MCG/ACT nasal spray  Commonly known as:  FLONASE  Place 1 spray into the nose daily.     lansoprazole 30 MG capsule  Commonly known as:  PREVACID  Take 1 capsule (30 mg total) by mouth daily.     latanoprost 0.005 % ophthalmic solution  Commonly known as:  XALATAN  Place 1 drop into both eyes at bedtime.     loratadine 10 MG tablet  Commonly known as:  CLARITIN  Take 10 mg by mouth daily.     loteprednol 0.5 % ophthalmic suspension   Commonly known as:  LOTEMAX  Place 1 drop into the left eye daily.     nitroGLYCERIN 0.4 MG SL tablet  Commonly known as:  NITROSTAT  Place 1 tablet (0.4 mg total) under the tongue every 5 (five) minutes as needed for chest pain.     PATADAY 0.2 % Soln  Generic drug:  Olopatadine HCl  Place 1 drop into the right eye as needed.     potassium chloride 20 MEQ/15ML (10%) solution  Take 15 mLs (20 mEq total) by mouth daily.     QVAR 80 MCG/ACT inhaler  Generic drug:  beclomethasone  Inhale two puffs twice daily; Rinse gargle and spit; Use with spacer     risperiDONE 0.5 MG tablet  Commonly known as:  RISPERDAL  TAKE 1 TABLET BY MOUTH TWICE A DAY     SENIOR MULTIVITAMIN PLUS Tabs  Take by mouth.     vitamin B-12 1000 MCG tablet  Commonly known as:  CYANOCOBALAMIN  Take 1,000 mcg by mouth daily.     vitamin C 500 MG tablet  Commonly known as:  ASCORBIC ACID  Take 500 mg by  mouth daily.        Discharge Condition:  Disposition:    Consults cardiology  Significant Diagnostic Studies: Dg Chest 2 View  06/09/2014   CLINICAL DATA:  Chest pain.  EXAM: CHEST  2 VIEW  COMPARISON:  March 15, 2011.  FINDINGS: Stable mild cardiomegaly. No pneumothorax or pleural effusion is noted. Both lungs are clear. The visualized skeletal structures are unremarkable.  IMPRESSION: No acute cardiopulmonary abnormality seen.   Electronically Signed   By: Sabino Dick M.D.   On: 06/09/2014 21:40   Mr Brain Wo Contrast  06/10/2014   CLINICAL DATA:  Left arm weakness  EXAM: MRI HEAD WITHOUT CONTRAST  TECHNIQUE: Multiplanar, multiecho pulse sequences of the brain and surrounding structures were obtained without intravenous contrast.  COMPARISON:  None.  FINDINGS: Ventricle size is normal. Cerebral volume normal for age. Craniocervical junction normal. Cervical spondylosis moderate to advanced. Pituitary normal in size.  Negative for acute infarct.  Chronic microvascular ischemic changes in the white matter.  Chronic ischemia in the pons.  Negative for hemorrhage or fluid collection.  Negative for mass or edema.  No shift of the midline structures.  Mucous retention cyst right maxillary sinus with mild mucosal edema in the paranasal sinuses.  IMPRESSION: Moderately advanced chronic microvascular ischemia.  Negative for acute infarct.  Moderate to Advanced cervical spondylosis.   Electronically Signed   By: Franchot Gallo M.D.   On: 06/10/2014 09:24       Microbiology: No results found for this or any previous visit (from the past 240 hour(s)).   Labs: Results for orders placed during the hospital encounter of 06/09/14 (from the past 48 hour(s))  CBC     Status: Abnormal   Collection Time    06/09/14  7:44 PM      Result Value Ref Range   WBC 6.4  4.0 - 10.5 K/uL   RBC 3.88  3.87 - 5.11 MIL/uL   Hemoglobin 12.2  12.0 - 15.0 g/dL   HCT 34.2 (*) 36.0 - 46.0 %   MCV 88.1  78.0 - 100.0 fL   MCH 31.4  26.0 - 34.0 pg   MCHC 35.7  30.0 - 36.0 g/dL   RDW 14.3  11.5 - 15.5 %   Platelets 206  150 - 400 K/uL  PRO B NATRIURETIC PEPTIDE     Status: None   Collection Time    06/09/14  7:44 PM      Result Value Ref Range   Pro B Natriuretic peptide (BNP) 127.5  0 - 450 pg/mL  BASIC METABOLIC PANEL     Status: Abnormal   Collection Time    06/09/14  7:44 PM      Result Value Ref Range   Sodium 142  137 - 147 mEq/L   Potassium 4.2  3.7 - 5.3 mEq/L   Chloride 105  96 - 112 mEq/L   CO2 23  19 - 32 mEq/L   Glucose, Bld 101 (*) 70 - 99 mg/dL   BUN 16  6 - 23 mg/dL   Creatinine, Ser 0.85  0.50 - 1.10 mg/dL   Calcium 9.4  8.4 - 10.5 mg/dL   GFR calc non Af Amer 60 (*) >90 mL/min   GFR calc Af Amer 69 (*) >90 mL/min   Comment: (NOTE)     The eGFR has been calculated using the CKD EPI equation.     This calculation has not been validated in all clinical situations.  eGFR's persistently <90 mL/min signify possible Chronic Kidney     Disease.   Anion gap 14  5 - 15  I-STAT TROPOININ, ED      Status: None   Collection Time    06/09/14  7:53 PM      Result Value Ref Range   Troponin i, poc 0.01  0.00 - 0.08 ng/mL   Comment 3            Comment: Due to the release kinetics of cTnI,     a negative result within the first hours     of the onset of symptoms does not rule out     myocardial infarction with certainty.     If myocardial infarction is still suspected,     repeat the test at appropriate intervals.  CBC     Status: Abnormal   Collection Time    06/09/14 10:58 PM      Result Value Ref Range   WBC 5.3  4.0 - 10.5 K/uL   RBC 3.74 (*) 3.87 - 5.11 MIL/uL   Hemoglobin 11.7 (*) 12.0 - 15.0 g/dL   HCT 33.0 (*) 36.0 - 46.0 %   MCV 88.2  78.0 - 100.0 fL   MCH 31.3  26.0 - 34.0 pg   MCHC 35.5  30.0 - 36.0 g/dL   RDW 14.3  11.5 - 15.5 %   Platelets 208  150 - 400 K/uL  CREATININE, SERUM     Status: Abnormal   Collection Time    06/09/14 10:58 PM      Result Value Ref Range   Creatinine, Ser 0.86  0.50 - 1.10 mg/dL   GFR calc non Af Amer 59 (*) >90 mL/min   GFR calc Af Amer 68 (*) >90 mL/min   Comment: (NOTE)     The eGFR has been calculated using the CKD EPI equation.     This calculation has not been validated in all clinical situations.     eGFR's persistently <90 mL/min signify possible Chronic Kidney     Disease.  TROPONIN I     Status: None   Collection Time    06/09/14 10:58 PM      Result Value Ref Range   Troponin I <0.30  <0.30 ng/mL   Comment:            Due to the release kinetics of cTnI,     a negative result within the first hours     of the onset of symptoms does not rule out     myocardial infarction with certainty.     If myocardial infarction is still suspected,     repeat the test at appropriate intervals.  LIPID PANEL     Status: None   Collection Time    06/09/14 10:58 PM      Result Value Ref Range   Cholesterol 185  0 - 200 mg/dL   Triglycerides 39  <150 mg/dL   HDL 98  >39 mg/dL   Total CHOL/HDL Ratio 1.9     VLDL 8  0 - 40 mg/dL    LDL Cholesterol 79  0 - 99 mg/dL   Comment:            Total Cholesterol/HDL:CHD Risk     Coronary Heart Disease Risk Table                         Men   Women  1/2 Average Risk   3.4   3.3      Average Risk       5.0   4.4      2 X Average Risk   9.6   7.1      3 X Average Risk  23.4   11.0                Use the calculated Patient Ratio     above and the CHD Risk Table     to determine the patient's CHD Risk.                ATP III CLASSIFICATION (LDL):      <100     mg/dL   Optimal      100-129  mg/dL   Near or Above                        Optimal      130-159  mg/dL   Borderline      160-189  mg/dL   High      >190     mg/dL   Very High  TSH     Status: None   Collection Time    06/09/14 11:57 PM      Result Value Ref Range   TSH 2.510  0.350 - 4.500 uIU/mL  COMPREHENSIVE METABOLIC PANEL     Status: Abnormal   Collection Time    06/10/14  4:41 AM      Result Value Ref Range   Sodium 142  137 - 147 mEq/L   Potassium 3.9  3.7 - 5.3 mEq/L   Chloride 108  96 - 112 mEq/L   CO2 25  19 - 32 mEq/L   Glucose, Bld 86  70 - 99 mg/dL   BUN 17  6 - 23 mg/dL   Creatinine, Ser 0.92  0.50 - 1.10 mg/dL   Calcium 8.6  8.4 - 10.5 mg/dL   Total Protein 5.6 (*) 6.0 - 8.3 g/dL   Albumin 2.8 (*) 3.5 - 5.2 g/dL   AST 13  0 - 37 U/L   ALT 9  0 - 35 U/L   Alkaline Phosphatase 55  39 - 117 U/L   Total Bilirubin 0.5  0.3 - 1.2 mg/dL   GFR calc non Af Amer 54 (*) >90 mL/min   GFR calc Af Amer 63 (*) >90 mL/min   Comment: (NOTE)     The eGFR has been calculated using the CKD EPI equation.     This calculation has not been validated in all clinical situations.     eGFR's persistently <90 mL/min signify possible Chronic Kidney     Disease.   Anion gap 9  5 - 15  CBC WITH DIFFERENTIAL     Status: Abnormal   Collection Time    06/10/14  4:41 AM      Result Value Ref Range   WBC 4.4  4.0 - 10.5 K/uL   RBC 3.43 (*) 3.87 - 5.11 MIL/uL   Hemoglobin 10.5 (*) 12.0 - 15.0 g/dL   HCT 30.3  (*) 36.0 - 46.0 %   MCV 88.3  78.0 - 100.0 fL   MCH 30.6  26.0 - 34.0 pg   MCHC 34.7  30.0 - 36.0 g/dL   RDW 14.5  11.5 - 15.5 %   Platelets 189  150 - 400 K/uL   Neutrophils Relative % 44  43 - 77 %  Neutro Abs 2.0  1.7 - 7.7 K/uL   Lymphocytes Relative 38  12 - 46 %   Lymphs Abs 1.7  0.7 - 4.0 K/uL   Monocytes Relative 14 (*) 3 - 12 %   Monocytes Absolute 0.6  0.1 - 1.0 K/uL   Eosinophils Relative 3  0 - 5 %   Eosinophils Absolute 0.1  0.0 - 0.7 K/uL   Basophils Relative 1  0 - 1 %   Basophils Absolute 0.0  0.0 - 0.1 K/uL  TROPONIN I     Status: None   Collection Time    06/10/14  4:41 AM      Result Value Ref Range   Troponin I <0.30  <0.30 ng/mL   Comment:            Due to the release kinetics of cTnI,     a negative result within the first hours     of the onset of symptoms does not rule out     myocardial infarction with certainty.     If myocardial infarction is still suspected,     repeat the test at appropriate intervals.     HPI  [name] [name] is a 51F with HTN and PVD admitted with chest pain. The pain is dull, chest pressure in the center of her chest and does not radiate. There is associated pain in the L side of her neck. She denies SOB, nausea, vomiting or diaphoresis. Symptoms have been ongoing for two weeks and getting progressively worse. This afternoon she developed chest pressure after doing house work that would not subside, so she decided to present to the ED for evaluation.  [name] [name] was evaluated by Dr. Radford Pax 02/2014 for SOB and LE edema. At that time she had an echo that showed normal EF and grade 1 diastolic dysfunction. Dr. Radford Pax recommended a nuclear stress, however the patient declined. She remembers having a stress test many years ago and she developed severe chest pain after the procedure. She is afraid of recurrent symptoms. Per the patient, this stress test was negative for coronary disease. At the time of her visit with Dr. Radford Pax she also  complained of LE edema that improved with an increased dose of lasix. She subsequently stopped taking lasix because it "made me sick." She cannot articulate how it made her sick. The edema has not recurred.  HOSPITAL COURSE:  1- Chest Pain  Cardiology consulted Cardiology offered patient Lexiscan Myoview. She currently refuses Seems like she had a bad experience with previous stress test. -no active chest pain currently outpatient f/u Dr Fransico Him Cardiac enzymes negative -risk stratification labs  LDL 79, HDL 98 Telemetry uneventful Discharge home today if no desire to have a stress test  2-L arm weakness  MRI of the brain shows Moderately advanced chronic microvascular ischemia.  Negative for acute infarct.  Moderate to Advanced cervical spondylosis -new complaint today  -no significant weakness on exam today  Neurologically stable  3-Asthma  -stable  -no resp distress/increased WOB/wheezing  -cont home regimen   4-HTN  -stable  -cont home regimen   4-Anxiety  -stable  -cont risperdal     Discharge Exam: Blood pressure 137/78, pulse 59, temperature 98.2 F (36.8 C), temperature source Oral, resp. rate 18, height _0  (1.575 m), weight 59.875 kg (132 lb), SpO2 100.00%.  General: Well appearing. No respiratory difficulty  HEENT: normal  Neck: supple. no JVD. Carotids 2+ bilat; no bruits. No lymphadenopathy or thryomegaly appreciated.  Cor: PMI nondisplaced. Regular rate & rhythm. No rubs, gallops or murmurs.  Lungs: CTAB  Abdomen: soft, nontender, nondistended. No hepatosplenomegaly. No bruits or masses. Good bowel sounds.  Extremities: no cyanosis, clubbing, rash, edema  Neuro: alert & oriented x 3, cranial nerves grossly intact. moves all 4 extremities w/o difficulty. Affect pleasant.           Follow-up Information   Follow up with REED, TIFFANY, DO. Schedule an appointment as soon as possible for a visit in 1 week.   Specialty:  Geriatric Medicine    Contact information:   [address]. [place] [address] [phone]       Follow up with Sueanne Margarita, MD. Schedule an appointment as soon as possible for a visit in 2 weeks.   Specialty:  Cardiology   Contact information:   [address] [place] [address] [phone]       Signed: Reyne Dumas 06/10/2014, 10:00 AM

## 2014-06-10 NOTE — Progress Notes (Signed)
UR Completed.  Erhard Senske Jane [phone] 06/10/2014  

## 2014-06-10 NOTE — Discharge Instructions (Signed)
Transient Ischemic Attack  A transient ischemic attack (TIA) is a "warning stroke" that causes stroke-like symptoms. Unlike a stroke, a TIA does not cause permanent damage to the brain. The symptoms of a TIA can happen very fast and do not last long. It is important to know the symptoms of a TIA and what to do. This can help prevent a major stroke or death.  CAUSES   · A TIA is caused by a temporary blockage in an artery in the brain or neck (carotid artery). The blockage does not allow the brain to get the blood supply it needs and can cause different symptoms. The blockage can be caused by either:  ¨ A blood clot.  ¨ Fatty buildup (plaque) in a neck or brain artery.  RISK FACTORS  · High blood pressure (hypertension).  · High cholesterol.  · Diabetes mellitus.  · Heart disease.  · The build up of plaque in the blood vessels (peripheral artery disease or atherosclerosis).  · The build up of plaque in the blood vessels providing blood and oxygen to the brain (carotid artery stenosis).  · An abnormal heart rhythm (atrial fibrillation).  · Obesity.  · Smoking.  · Taking oral contraceptives (especially in combination with smoking).  · Physical inactivity.  · A diet high in fats, salt (sodium), and calories.  · Alcohol use.  · Use of illegal drugs (especially cocaine and methamphetamine).  · Being female.  · Being African American.  · Being over the age of 55.  · Family history of stroke.  · Previous history of blood clots, stroke, TIA, or heart attack.  · Sickle cell disease.  SYMPTOMS   TIA symptoms are the same as a stroke but are temporary. These symptoms usually develop suddenly, or may be newly present upon awakening from sleep:  · Sudden weakness or numbness of the face, arm, or leg, especially on one side of the body.  · Sudden trouble walking or difficulty moving arms or legs.  · Sudden confusion.  · Sudden personality changes.  · Trouble speaking (aphasia) or understanding.  · Difficulty swallowing.  · Sudden  trouble seeing in one or both eyes.  · Double vision.  · Dizziness.  · Loss of balance or coordination.  · Sudden severe headache with no known cause.  · Trouble reading or writing.  · Loss of bowel or bladder control.  · Loss of consciousness.  DIAGNOSIS   Your caregiver may be able to determine the presence or absence of a TIA based on your symptoms, history, and physical exam. Computed tomography (CT scan) of the brain is usually performed to help identify a TIA. Other tests may be done to diagnose a TIA. These tests may include:  · Electrocardiography.  · Continuous heart monitoring.  · Echocardiography.  · Carotid ultrasonography.  · Magnetic resonance imaging (MRI).  · A scan of the brain circulation.  · Blood tests.  PREVENTION   The risk of a TIA can be decreased by appropriately treating high blood pressure, high cholesterol, diabetes, heart disease, and obesity and by quitting smoking, limiting alcohol, and staying physically active.  TREATMENT   Time is of the essence. Since the symptoms of TIA are the same as a stroke, it is important to seek treatment as soon as possible because you may need a medicine to dissolve the clot (thrombolytic) that cannot be given if too much time has passed. Treatment options vary. Treatment options may include rest, oxygen, intravenous (IV) fluids,   and medicines to thin the blood (anticoagulants). Medicines and diet may be used to address diabetes, high blood pressure, and other risk factors. Measures will be taken to prevent short-term and long-term complications, including infection from breathing foreign material into the lungs (aspiration pneumonia), blood clots in the legs, and falls. Treatment options include procedures to either remove plaque in the carotid arteries or dilate carotid arteries that have narrowed due to plaque. Those procedures are:  · Carotid endarterectomy.  · Carotid angioplasty and stenting.  HOME CARE INSTRUCTIONS   · Take all medicines prescribed  by your caregiver. Follow the directions carefully. Medicines may be used to control risk factors for a stroke. Be sure you understand all your medicine instructions.  · You may be told to take aspirin or the anticoagulant warfarin. Warfarin needs to be taken exactly as instructed.  ¨ Taking too much or too little warfarin is dangerous. Too much warfarin increases the risk of bleeding. Too little warfarin continues to allow the risk for blood clots. While taking warfarin, you will need to have regular blood tests to measure your blood clotting time. A PT blood test measures how long it takes for blood to clot. Your PT is used to calculate another value called an INR. Your PT and INR help your caregiver to adjust your dose of warfarin. The dose can change for many reasons. It is critically important that you take warfarin exactly as prescribed.  ¨ Many foods, especially foods high in vitamin K can interfere with warfarin and affect the PT and INR. Foods high in vitamin K include spinach, kale, broccoli, cabbage, collard and turnip greens, brussels sprouts, peas, cauliflower, seaweed, and parsley as well as beef and pork liver, green tea, and soybean oil. You should eat a consistent amount of foods high in vitamin K. Avoid major changes in your diet, or notify your caregiver before changing your diet. Arrange a visit with a dietitian to answer your questions.  ¨ Many medicines can interfere with warfarin and affect the PT and INR. You must tell your caregiver about any and all medicines you take, this includes all vitamins and supplements. Be especially cautious with aspirin and anti-inflammatory medicines. Do not take or discontinue any prescribed or over-the-counter medicine except on the advice of your caregiver or pharmacist.  ¨ Warfarin can have side effects, such as excessive bruising or bleeding. You will need to hold pressure over cuts for longer than usual. Your caregiver or pharmacist will discuss other  potential side effects.  ¨ Avoid sports or activities that may cause injury or bleeding.  ¨ Be mindful when shaving, flossing your teeth, or handling sharp objects.  ¨ Alcohol can change the body's ability to handle warfarin. It is best to avoid alcoholic drinks or consume only very small amounts while taking warfarin. Notify your caregiver if you change your alcohol intake.  ¨ Notify your dentist or other caregivers before procedures.  · Eat a diet that includes 5 or more servings of fruits and vegetables each day. This may reduce the risk of stroke. Certain diets may be prescribed to address high blood pressure, high cholesterol, diabetes, or obesity.  ¨ A low-sodium, low-saturated fat, low-trans fat, low-cholesterol diet is recommended to manage high blood pressure.  ¨ A low-saturated fat, low-trans fat, low-cholesterol, and high-fiber diet may control cholesterol levels.  ¨ A controlled-carbohydrate, controlled-sugar diet is recommended to manage diabetes.  ¨ A reduced-calorie, low-sodium, low-saturated fat, low-trans fat, low-cholesterol diet is recommended to manage obesity.  ·   Maintain a healthy weight.  · Stay physically active. It is recommended that you get at least 30 minutes of activity on most or all days.  · Do not smoke.  · Limit alcohol use even if you are not taking warfarin. Moderate alcohol use is considered to be:  ¨ No more than 2 drinks each day for men.  ¨ No more than 1 drink each day for nonpregnant women.  · Stop drug abuse.  · Home safety. A safe home environment is important to reduce the risk of falls. Your caregiver may arrange for specialists to evaluate your home. Having grab bars in the bedroom and bathroom is often important. Your caregiver may arrange for equipment to be used at home, such as raised toilets and a seat for the shower.  · Follow all instructions for follow-up with your caregiver. This is very important. This includes any referrals and lab tests. Proper follow up can  prevent a stroke or another TIA from occurring.  SEEK MEDICAL CARE IF:  · You have personality changes.  · You have difficulty swallowing.  · You are seeing double.  · You have dizziness.  · You have a fever.  · You have skin breakdown.  SEEK IMMEDIATE MEDICAL CARE IF:   Any of these symptoms may represent a serious problem that is an emergency. Do not wait to see if the symptoms will go away. Get medical help right away. Call your local emergency services (911 in [place] Do not drive yourself to the hospital.  · You have sudden weakness or numbness of the face, arm, or leg, especially on one side of the body.  · You have sudden trouble walking or difficulty moving arms or legs.  · You have sudden confusion.  · You have trouble speaking (aphasia) or understanding.  · You have sudden trouble seeing in one or both eyes.  · You have a loss of balance or coordination.  · You have a sudden, severe headache with no known cause.  · You have new chest pain or an irregular heartbeat.  · You have a partial or total loss of consciousness.  MAKE SURE YOU:   · Understand these instructions.  · Will watch your condition.  · Will get help right away if you are not doing well or get worse.  Document Released: 07/03/2005 Document Revised: 09/28/2013 Document Reviewed: 12/29/2013  [employer]® Patient Information ©2015 [employer]. This information is not intended to replace advice given to you by your health care provider. Make sure you discuss any questions you have with your health care provider.

## 2014-06-14 DIAGNOSIS — J45909 Unspecified asthma, uncomplicated: Secondary | ICD-10-CM | POA: Diagnosis not present

## 2014-06-14 DIAGNOSIS — J3089 Other allergic rhinitis: Secondary | ICD-10-CM | POA: Diagnosis not present

## 2014-06-15 ENCOUNTER — Ambulatory Visit (INDEPENDENT_AMBULATORY_CARE_PROVIDER_SITE_OTHER): Payer: Medicare Other | Admitting: Physician Assistant

## 2014-06-15 ENCOUNTER — Encounter: Payer: Self-pay | Admitting: Physician Assistant

## 2014-06-15 ENCOUNTER — Ambulatory Visit: Payer: Medicare Other | Admitting: Physician Assistant

## 2014-06-15 VITALS — BP 124/58 | HR 61 | Ht 62.0 in | Wt 134.1 lb

## 2014-06-15 DIAGNOSIS — R600 Localized edema: Secondary | ICD-10-CM

## 2014-06-15 DIAGNOSIS — R609 Edema, unspecified: Secondary | ICD-10-CM | POA: Diagnosis not present

## 2014-06-15 DIAGNOSIS — I10 Essential (primary) hypertension: Secondary | ICD-10-CM

## 2014-06-15 DIAGNOSIS — R071 Chest pain on breathing: Secondary | ICD-10-CM | POA: Diagnosis not present

## 2014-06-15 MED ORDER — HYDROCHLOROTHIAZIDE 12.5 MG PO CAPS: 12.5000 mg | ORAL_CAPSULE | Freq: Every day | ORAL | Status: DC | PRN

## 2014-06-15 NOTE — Progress Notes (Signed)
Date:  06/15/2014   ID:  [name] [name], DOB [birth year], MRN [id number]  PCP:  Hollace Kinnier, DO  Primary Cardiologist:  Radford Pax     History of Present Illness: [name] [name] is a 80 y.o. female with HTN and PVD admitted 06/09/14 with chest pain. The pain was dull, chest pressure in the center of her chest and does not radiate. There is associated pain in the L side of her neck.  She ruled out for MI and was offered a [place] but declined.  [name] [name] was evaluated by Dr. Radford Pax 02/2014 for SOB and LE edema. At that time she had an echo that showed normal EF and grade 1 diastolic dysfunction. Dr. Radford Pax recommended a nuclear stress, however the patient declined. She remembers having a stress test many years ago and she developed severe chest pain after the procedure. She is afraid of recurrent symptoms. Per the patient, this stress test was negative for coronary disease. At the time of her visit with Dr. Radford Pax she also complained of LE edema that improved with an increased dose of lasix. She subsequently stopped taking lasix because it "made me sick."   Patient presents for posthospital evaluation. She reports she has not had any chest pain since her discharge. She did have a prior to admission it was off-and-on pressure-like lasting about 10-15 minutes.  There was mild shortness of breath but no nausea, vomiting, diaphoresis orthopnea or PND.  She also denies fever, shortness of breath, orthopnea, dizziness, PND, cough, congestion, abdominal pain, hematochezia, melena, claudication.  Wt Readings from Last 3 Encounters:  06/15/14 134 lb 1.9 oz (60.836 kg)  06/09/14 132 lb (59.875 kg)  04/04/14 136 lb 6.4 oz (61.871 kg)     Past Medical History  Diagnosis Date  . Benign essential hypertension   . Osteoarthritis   . GERD (gastroesophageal reflux disease)   . Seasonal allergies   . Asthma   . Glaucoma   . PVD (peripheral vascular disease)   . Anxiety     Current Outpatient Prescriptions   Medication Sig Dispense Refill  . albuterol (PROVENTIL HFA;VENTOLIN HFA) 108 (90 BASE) MCG/ACT inhaler Inhale 2 puffs into the lungs every 6 (six) hours as needed for wheezing.      Marland Kitchen amLODipine (NORVASC) 5 MG tablet Take 1 tablet (5 mg total) by mouth daily.  30 tablet  5  . bimatoprost (LUMIGAN) 0.01 % SOLN Place 1 drop into both eyes at bedtime.      . brinzolamide (AZOPT) 1 % ophthalmic suspension Place 1 drop into both eyes 3 (three) times daily.      . budesonide-formoterol (SYMBICORT) 160-4.5 MCG/ACT inhaler Inhale 2 puffs into the lungs 2 (two) times daily.      . Calcium Carbonate-Vitamin D (CALTRATE 600+D) 600-400 MG-UNIT per tablet Take 2 tablets by mouth daily.      . Coenzyme Q10 (COQ10) 50 MG CAPS Take one tablet by mouth once daily      . fluticasone (FLONASE) 50 MCG/ACT nasal spray Place 1 spray into the nose daily.      . lansoprazole (PREVACID) 30 MG capsule Take 1 capsule (30 mg total) by mouth daily.  30 capsule  3  . loratadine (CLARITIN) 10 MG tablet Take 10 mg by mouth daily.      Marland Kitchen loteprednol (LOTEMAX) 0.5 % ophthalmic suspension Place 1 drop into the left eye daily.      . Multiple Vitamins-Minerals (SENIOR MULTIVITAMIN PLUS) TABS Take by mouth.      Marland Kitchen  nitroGLYCERIN (NITROSTAT) 0.4 MG SL tablet Place 1 tablet (0.4 mg total) under the tongue every 5 (five) minutes as needed for chest pain.  60 tablet  2  . Omega-3 Fatty Acids (FISH OIL) 1200 MG CPDR Take one tablet by mouth once daily      . RESTASIS 0.05 % ophthalmic emulsion       . risperiDONE (RISPERDAL) 0.5 MG tablet TAKE 1 TABLET BY MOUTH TWICE A DAY  60 tablet  5  . vitamin B-12 (CYANOCOBALAMIN) 1000 MCG tablet Take 1,000 mcg by mouth daily.      . vitamin C (ASCORBIC ACID) 500 MG tablet Take 500 mg by mouth daily.      . hydrochlorothiazide (MICROZIDE) 12.5 MG capsule Take 1 capsule (12.5 mg total) by mouth daily as needed.  30 capsule  3   Current Facility-Administered Medications  Medication Dose Route  Frequency Provider Last Rate Last Dose  . hydrocortisone (ANUSOL-HC) 2.5 % rectal cream   Rectal BID Gayland Curry, DO        Allergies:    Allergies  Allergen Reactions  . Aspirin   . Codeine   . Iodine     Pt is not aware of this allergy, does not recall much info....//a.c.  . Prednisone   . Sulfa Antibiotics     Social History:  The patient  reports that she has never smoked. She does not have any smokeless tobacco history on file. She reports that she does not drink alcohol or use illicit drugs.   Family history:   Family History  Problem Relation Age of Onset  . Stroke Father     ROS:  Please see the history of present illness.  All other systems reviewed and negative.   PHYSICAL EXAM: VS:  BP 124/58  Pulse 61  Ht 5\' 2"  (1.575 m)  Wt 134 lb 1.9 oz (60.836 kg)  BMI 24.52 kg/m2 Well nourished, well developed, in no acute distress HEENT: Pupils are equal round react to light accommodation extraocular movements are intact.  Neck: no JVDNo cervical lymphadenopathy. Cardiac: Regular rate and rhythm with 1/6 sys murmur. + gallop No  rubs Lungs:  clear to auscultation bilaterally, no wheezing, rhonchi or rales Abd: soft, nontender, positive bowel sounds all quadrants, no hepatosplenomegaly Ext: 1+ lower extremity edema.  2+ radial and dorsalis pedis pulses. Skin: warm and dry Neuro:  Grossly normal  EKG:  RBBB, 1st degree AVB, 61 bpm  ASSESSMENT AND PLAN:  Problem List Items Addressed This Visit   Benign essential hypertension     Pressure well controlled    Relevant Medications      Hydrochlorothiazide 12.5 mg po tabs   Edema extremities     1+ lower extremity edema today and a gallop on exam. I have Started HCTZ 12.5 mg which she will take to tomorrow next day and then take as needed for edema    Chest pain - Primary     She's had no further chest pain. I again offered a Lexiscan stress test and she has declined    Relevant Orders      EKG 12-Lead

## 2014-06-15 NOTE — Patient Instructions (Signed)
1.  Start HCTZ 12.5 mg.  Take tomorrow and Thursday, then as needed for swelling. 2.  Follow up with Dr. Radford Pax November as scheduled. 3.  If you change your mind about the stress test, let us know.

## 2014-06-15 NOTE — Assessment & Plan Note (Signed)
She's had no further chest pain. I again offered a Lexiscan stress test and she has declined

## 2014-06-15 NOTE — Assessment & Plan Note (Signed)
Pressure well controlled.

## 2014-06-15 NOTE — Assessment & Plan Note (Signed)
1+ lower extremity edema today and a gallop on exam. I have Started HCTZ 12.5 mg which she will take to tomorrow next day and then take as needed for edema

## 2014-06-30 ENCOUNTER — Encounter: Payer: Self-pay | Admitting: Internal Medicine

## 2014-06-30 ENCOUNTER — Ambulatory Visit (INDEPENDENT_AMBULATORY_CARE_PROVIDER_SITE_OTHER): Payer: Medicare Other | Admitting: Internal Medicine

## 2014-06-30 ENCOUNTER — Ambulatory Visit: Payer: Medicare Other | Admitting: Internal Medicine

## 2014-06-30 VITALS — BP 144/80 | HR 71 | Temp 97.5°F | Resp 18 | Ht 62.0 in | Wt 135.6 lb

## 2014-06-30 DIAGNOSIS — J4520 Mild intermittent asthma, uncomplicated: Secondary | ICD-10-CM

## 2014-06-30 DIAGNOSIS — R609 Edema, unspecified: Secondary | ICD-10-CM | POA: Diagnosis not present

## 2014-06-30 DIAGNOSIS — Z23 Encounter for immunization: Secondary | ICD-10-CM

## 2014-06-30 DIAGNOSIS — J45909 Unspecified asthma, uncomplicated: Secondary | ICD-10-CM | POA: Diagnosis not present

## 2014-06-30 DIAGNOSIS — K219 Gastro-esophageal reflux disease without esophagitis: Secondary | ICD-10-CM

## 2014-06-30 DIAGNOSIS — I10 Essential (primary) hypertension: Secondary | ICD-10-CM

## 2014-06-30 DIAGNOSIS — R0789 Other chest pain: Secondary | ICD-10-CM

## 2014-06-30 NOTE — Progress Notes (Signed)
[name], female   DOB: 1923/12/15, 80 y.o.   MRN: [id number]   Location:  [facility]  Code Status: full code  Allergies  Allergen Reactions  . Aspirin   . Codeine   . Iodine     Pt is not aware of this allergy, does not recall much info....//a.c.  . Prednisone   . Sulfa Antibiotics     Chief Complaint  Patient presents with  . Hospitalization Follow-up    HPI: Patient is a 80 y.o. black female seen in the office today for a hospital f/u with chest pressure. She has already followed up with cardiology.  She has refused a stress test to further evaluate.  She was started on hctz 12.5mg  06/15/14 by PA Hager at cardiology.  Could not take more than one pill--drained her out.  Does have some mild edema today.  Says her potassium gets low, hands get cramped and she can't turn over in the bed due to cramps.  Had a bad experience last time she had a stress test.  Says she had gripping pain after the test as she drove home and had a pain in the middle of her chest a long time afterwards, not during it.  Says she would not want to have a heart catheterization if something was found anyway.  Her daughter explains that the episodes are actually more respiratory and she followed up with pulmonary and the meds were then changed.  Also had shortness of breath with her chest pressure.  Seemed to happen after she was busy all day or at night.  Does her housecleaning.    Had some difficulty with low blood pressure that would get low as the day went on back in July.  Resolved.  Has not been low or dropping for 10 days.    Says she is sleeping well at night with risperdal.  Eating well.  Is back to cooking some.  Mood is good.  Her daughter agrees.  Does have a good support system and goes out and about a lot.  Her daughter thinks she missed her evening respiratory medicine when she was busy out and about and talking on the phone at night. Had been  using Qvar and albuterol, but Dr. Fredderick Phenix switched her to symbicort.  So far, this has been helpful.    Review of Systems:  Review of Systems  Constitutional: Negative for fever and chills.  HENT: Negative for congestion.   Respiratory: Negative for shortness of breath.   Cardiovascular: Negative for chest pain and palpitations.  Gastrointestinal: Negative for abdominal pain, constipation, blood in stool and melena.  Genitourinary: Negative for dysuria, urgency and frequency.  Musculoskeletal: Negative for falls and myalgias.  Skin: Negative for rash.  Neurological: Negative for dizziness and loss of consciousness.  Endo/Heme/Allergies: Does not bruise/bleed easily.  Psychiatric/Behavioral: Positive for hallucinations and memory loss.    Past Medical History  Diagnosis Date  . Benign essential hypertension   . Osteoarthritis   . GERD (gastroesophageal reflux disease)   . Seasonal allergies   . Asthma   . Glaucoma   . PVD (peripheral vascular disease)   . Anxiety     Past Surgical History  Procedure Laterality Date  . Abdominal hysterectomy  1981  . Tonsillectomy  1953    Social History:   reports that she has never smoked. She does not have any smokeless tobacco history on file. She reports that she does  not drink alcohol or use illicit drugs.  Family History  Problem Relation Age of Onset  . Stroke Father     Medications: Patient's Medications  New Prescriptions   No medications on file  Previous Medications   ALBUTEROL (PROVENTIL HFA;VENTOLIN HFA) 108 (90 BASE) MCG/ACT INHALER    Inhale 2 puffs into the lungs every 6 (six) hours as needed for wheezing.   AMLODIPINE (NORVASC) 5 MG TABLET    Take 1 tablet (5 mg total) by mouth daily.   BIMATOPROST (LUMIGAN) 0.01 % SOLN    Place 1 drop into both eyes at bedtime.   BRINZOLAMIDE (AZOPT) 1 % OPHTHALMIC SUSPENSION    Place 1 drop into both eyes 3 (three) times daily.   BUDESONIDE-FORMOTEROL (SYMBICORT) 160-4.5 MCG/ACT  INHALER    Inhale 2 puffs into the lungs 2 (two) times daily.   CALCIUM CARBONATE-VITAMIN D (CALTRATE 600+D) 600-400 MG-UNIT PER TABLET    Take 2 tablets by mouth daily.   COENZYME Q10 (COQ10) 50 MG CAPS    Take one tablet by mouth once daily   FLUTICASONE (FLONASE) 50 MCG/ACT NASAL SPRAY    Place 1 spray into the nose daily.   HYDROCHLOROTHIAZIDE (MICROZIDE) 12.5 MG CAPSULE    Take 1 capsule (12.5 mg total) by mouth daily as needed.   LANSOPRAZOLE (PREVACID) 30 MG CAPSULE    Take 1 capsule (30 mg total) by mouth daily.   LORATADINE (CLARITIN) 10 MG TABLET    Take 10 mg by mouth daily.   LOTEPREDNOL (LOTEMAX) 0.5 % OPHTHALMIC SUSPENSION    Place 1 drop into the left eye daily.   MULTIPLE VITAMINS-MINERALS (SENIOR MULTIVITAMIN PLUS) TABS    Take by mouth.   NITROGLYCERIN (NITROSTAT) 0.4 MG SL TABLET    Place 1 tablet (0.4 mg total) under the tongue every 5 (five) minutes as needed for chest pain.   OMEGA-3 FATTY ACIDS (FISH OIL) 1200 MG CPDR    Take one tablet by mouth once daily   RESTASIS 0.05 % OPHTHALMIC EMULSION    Place 1 drop into both eyes 2 (two) times daily.    RISPERIDONE (RISPERDAL) 0.5 MG TABLET    TAKE 1 TABLET BY MOUTH TWICE A DAY   VITAMIN B-12 (CYANOCOBALAMIN) 1000 MCG TABLET    Take 1,000 mcg by mouth daily.   VITAMIN C (ASCORBIC ACID) 500 MG TABLET    Take 500 mg by mouth daily.  Modified Medications   No medications on file  Discontinued Medications   QVAR 80 MCG/ACT INHALER         Physical Exam: Filed Vitals:   06/30/14 1310  BP: 144/80  Pulse: 71  Temp: 97.5 F (36.4 C)  TempSrc: Oral  Resp: 18  Height: 5\' 2"  (1.575 m)  Weight: 135 lb 9.6 oz (61.508 kg)  SpO2: 99%  Physical Exam  Constitutional: She is oriented to person, place, and time. She appears well-developed and well-nourished.  Cardiovascular: Normal rate, regular rhythm, normal heart sounds and intact distal pulses.   Pulmonary/Chest: Effort normal and breath sounds normal. No respiratory distress.   Abdominal: Soft. Bowel sounds are normal. She exhibits no distension and no mass. There is no tenderness.  Musculoskeletal: Normal range of motion. She exhibits no edema and no tenderness.  Neurological: She is alert and oriented to person, place, and time.  Skin: Skin is warm and dry.  Psychiatric: She has a normal mood and affect.    Labs reviewed: Basic Metabolic Panel:  Recent Labs  12/08/13 1137  06/09/14 1944 06/09/14 2258 06/09/14 2357 06/10/14 0441  NA 143 142  --   --  142  K 3.5 4.2  --   --  3.9  CL 108 105  --   --  108  CO2 26 23  --   --  25  GLUCOSE 95 101*  --   --  86  BUN 16 16  --   --  17  CREATININE 0.9 0.85 0.86  --  0.92  CALCIUM 9.6 9.4  --   --  8.6  TSH  --   --   --  2.510  --    Liver Function Tests:  Recent Labs  08/17/13 0836 06/10/14 0441  AST 20 13  ALT 17 9  ALKPHOS 82 55  BILITOT 0.5 0.5  PROT 6.8 5.6*  ALBUMIN  --  2.8*   No results found for this basename: LIPASE, AMYLASE,  in the last 8760 hours No results found for this basename: AMMONIA,  in the last 8760 hours CBC:  Recent Labs  08/17/13 0836 06/09/14 1944 06/09/14 2258 06/10/14 0441  WBC 8.1 6.4 5.3 4.4  NEUTROABS 3.6  --   --  2.0  HGB 12.3 12.2 11.7* 10.5*  HCT 36.6 34.2* 33.0* 30.3*  MCV 94 88.1 88.2 88.3  PLT  --  206 208 189   Lipid Panel:  Recent Labs  08/17/13 0836 06/09/14 2258  CHOL  --  185  HDL 105 98  LDLCALC 73 79  TRIG 47 39  CHOLHDL 1.8 1.9   Lab Results  Component Value Date   HGBA1C 5.7* 06/09/2014   Assessment/Plan 1. Asthma, mild intermittent, uncomplicated -cont singulair as prescribed by Dr. Melina Copa did not know updated med list and neither did her daughter today  2. Edema -improved vs. prior visits, but she refuses to use diuretics -not taking the hctz  3. Benign essential hypertension -bp at goal, said to be low at home with some dizziness--advised that if it is less than 100/60 at home and she is dizzy, they should  let us know so I can stop norvasc, but here her bp is high  4. Chest pressure -multifactorial, but seems most likely to be respiratory in etiology -no mention of her olfactory hallucinations this time -cont prn ntg, prn albuterol and her lansoprazole -refuses cardiac workup and would not want cath and stenting if stress test were positive anyway  5. Gastroesophageal reflux disease without esophagitis -cont lansoprazole -denies any clear symptoms recently  6. Need for prophylactic vaccination and inoculation against influenza -flu shot given  Labs/tests ordered:  No orders of the defined types were placed in this encounter.   Next appt:  4 mos

## 2014-06-30 NOTE — Patient Instructions (Signed)
Please bring a copy of your living will and hcpoa.

## 2014-07-19 DIAGNOSIS — M1990 Unspecified osteoarthritis, unspecified site: Secondary | ICD-10-CM | POA: Diagnosis not present

## 2014-08-02 DIAGNOSIS — J4530 Mild persistent asthma, uncomplicated: Secondary | ICD-10-CM | POA: Diagnosis not present

## 2014-08-02 DIAGNOSIS — J3089 Other allergic rhinitis: Secondary | ICD-10-CM | POA: Diagnosis not present

## 2014-08-04 ENCOUNTER — Ambulatory Visit: Payer: Medicare Other | Admitting: Internal Medicine

## 2014-08-04 ENCOUNTER — Other Ambulatory Visit: Payer: Self-pay | Admitting: Internal Medicine

## 2014-08-04 DIAGNOSIS — H16223 Keratoconjunctivitis sicca, not specified as Sjogren's, bilateral: Secondary | ICD-10-CM | POA: Diagnosis not present

## 2014-08-04 DIAGNOSIS — H4011X3 Primary open-angle glaucoma, severe stage: Secondary | ICD-10-CM | POA: Diagnosis not present

## 2014-08-04 DIAGNOSIS — H02403 Unspecified ptosis of bilateral eyelids: Secondary | ICD-10-CM | POA: Diagnosis not present

## 2014-08-10 ENCOUNTER — Ambulatory Visit (INDEPENDENT_AMBULATORY_CARE_PROVIDER_SITE_OTHER): Payer: Medicare Other | Admitting: Cardiology

## 2014-08-10 ENCOUNTER — Encounter: Payer: Self-pay | Admitting: Cardiology

## 2014-08-10 VITALS — BP 130/70 | HR 66 | Ht 62.0 in | Wt 139.8 lb

## 2014-08-10 DIAGNOSIS — R0602 Shortness of breath: Secondary | ICD-10-CM

## 2014-08-10 DIAGNOSIS — I10 Essential (primary) hypertension: Secondary | ICD-10-CM

## 2014-08-10 DIAGNOSIS — R600 Localized edema: Secondary | ICD-10-CM

## 2014-08-10 DIAGNOSIS — R609 Edema, unspecified: Secondary | ICD-10-CM

## 2014-08-10 LAB — BASIC METABOLIC PANEL
BUN: 15 mg/dL (ref 6–23)
CO2: 25 mEq/L (ref 19–32)
CREATININE: 0.9 mg/dL (ref 0.4–1.2)
Calcium: 9.5 mg/dL (ref 8.4–10.5)
Chloride: 107 mEq/L (ref 96–112)
GFR: 79.05 mL/min (ref >60.00)
Glucose, Bld: 96 mg/dL (ref 70–99)
Potassium: 3.7 mEq/L (ref 3.5–5.1)
Sodium: 141 mEq/L (ref 135–145)

## 2014-08-10 NOTE — Progress Notes (Signed)
[facility] [place], [place] [place], [place]  [address] Phone: [phone] Fax:  [phone]  Date:  08/10/2014   ID:  [name] [name], [name] [name] 1924-05-09, MRN [id number]  PCP:  Hollace Kinnier, DO  Cardiologist:  Fransico Him, MD    History of Present Illness: [name] [name] is a 80 y.o. female with a history of HTN and asthma who presents today for followup of LE edema and SOB.  A 2D echo showed normal LVF.Since I saw her last she was in the hospital with CP in Sept.  She ruled out for MI and declined a stress test.   She is doing well today.  She denies any chest pain, SOB, DOE,  dizziness, palpitations or syncope.  She occasionally has some LE edema.    Wt Readings from Last 3 Encounters:  08/10/14 139 lb 12.8 oz (63.413 kg)  06/30/14 135 lb 9.6 oz (61.508 kg)  06/15/14 134 lb 1.9 oz (60.836 kg)     Past Medical History  Diagnosis Date  . Benign essential hypertension   . Osteoarthritis   . GERD (gastroesophageal reflux disease)   . Seasonal allergies   . Asthma   . Glaucoma   . PVD (peripheral vascular disease)   . Anxiety     Current Outpatient Prescriptions  Medication Sig Dispense Refill  . albuterol (PROVENTIL HFA;VENTOLIN HFA) 108 (90 BASE) MCG/ACT inhaler Inhale 2 puffs into the lungs every 6 (six) hours as needed for wheezing.    Marland Kitchen amLODipine (NORVASC) 5 MG tablet TAKE 1 TABLET (5 MG TOTAL) BY MOUTH DAILY. 30 tablet 5  . bimatoprost (LUMIGAN) 0.01 % SOLN Place 1 drop into both eyes at bedtime.    . brinzolamide (AZOPT) 1 % ophthalmic suspension Place 1 drop into both eyes 3 (three) times daily.    . budesonide-formoterol (SYMBICORT) 160-4.5 MCG/ACT inhaler Inhale 2 puffs into the lungs 2 (two) times daily.    . Calcium Carbonate-Vitamin D (CALTRATE 600+D) 600-400 MG-UNIT per tablet Take 2 tablets by mouth daily.    . Coenzyme Q10 (COQ10) 50 MG CAPS Take one tablet by mouth once daily    . fluticasone (FLONASE) 50 MCG/ACT nasal spray Place 1 spray into the nose daily.      . hydrochlorothiazide (MICROZIDE) 12.5 MG capsule Take 1 capsule (12.5 mg total) by mouth daily as needed. 30 capsule 3  . lansoprazole (PREVACID) 30 MG capsule Take 1 capsule (30 mg total) by mouth daily. 30 capsule 3  . loratadine (CLARITIN) 10 MG tablet Take 10 mg by mouth daily.    Marland Kitchen loteprednol (LOTEMAX) 0.5 % ophthalmic suspension Place 1 drop into the left eye daily.    . Multiple Vitamins-Minerals (SENIOR MULTIVITAMIN PLUS) TABS Take by mouth.    . nitroGLYCERIN (NITROSTAT) 0.4 MG SL tablet Place 1 tablet (0.4 mg total) under the tongue every 5 (five) minutes as needed for chest pain. 60 tablet 2  . Omega-3 Fatty Acids (FISH OIL) 1200 MG CPDR Take one tablet by mouth once daily    . RESTASIS 0.05 % ophthalmic emulsion Place 1 drop into both eyes 2 (two) times daily.     . risperiDONE (RISPERDAL) 0.5 MG tablet TAKE 1 TABLET BY MOUTH TWICE A DAY 60 tablet 5  . vitamin B-12 (CYANOCOBALAMIN) 1000 MCG tablet Take 1,000 mcg by mouth daily.    . vitamin C (ASCORBIC ACID) 500 MG tablet Take 500 mg by mouth daily.     Current Facility-Administered Medications  Medication Dose Route  Frequency Provider Last Rate Last Dose  . hydrocortisone (ANUSOL-HC) 2.5 % rectal cream   Rectal BID Gayland Curry, DO        Allergies:    Allergies  Allergen Reactions  . Aspirin   . Codeine   . Iodine     Pt is not aware of this allergy, does not recall much info....//a.c.  . Prednisone   . Sulfa Antibiotics     Social History:  The patient  reports that she has never smoked. She does not have any smokeless tobacco history on file. She reports that she does not drink alcohol or use illicit drugs.   Family History:  The patient's family history includes Stroke in her father.   ROS:  Please see the history of present illness.      All other systems reviewed and negative.   PHYSICAL EXAM: VS:  BP 130/70 mmHg  Pulse 66  Ht 5\' 2"  (1.575 m)  Wt 139 lb 12.8 oz (63.413 kg)  BMI 25.56 kg/m2 Well  nourished, well developed, in no acute distress HEENT: normal Neck: no JVD Cardiac:  normal S1, S2; RRR; 2/6 SM at RUSB  Lungs:  clear to auscultation bilaterally, no wheezing, rhonchi or rales Abd: soft, nontender, no hepatomegaly Ext: no edema Skin: warm and dry Neuro:  CNs 2-12 intact, no focal abnormalities noted    ASSESSMENT AND PLAN:  1. SOB/ CP-resolved.   Her BNP was normal. 2D echo was normal. She refused stress testing 2. LE edema which seems resolved - continue HCTZ - check BMET 3. HTN - well controlled - continue amlodipine    Followup with me in 6 months     Signed, Fransico Him, MD [facility] 08/10/2014 11:01 AM

## 2014-08-10 NOTE — Patient Instructions (Signed)
Your physician wants you to follow-up in: 6 months with Dr. Radford Pax. You will receive a reminder letter in the mail two months in advance. If you don't receive a letter, please call our office to schedule the follow-up appointment.  Your physician recommends that you have lab work TODAY (BMET)

## 2014-09-20 DIAGNOSIS — M6281 Muscle weakness (generalized): Secondary | ICD-10-CM | POA: Diagnosis not present

## 2014-10-16 ENCOUNTER — Other Ambulatory Visit: Payer: Self-pay | Admitting: Internal Medicine

## 2014-10-19 ENCOUNTER — Encounter: Payer: Self-pay | Admitting: Internal Medicine

## 2014-10-19 ENCOUNTER — Ambulatory Visit (INDEPENDENT_AMBULATORY_CARE_PROVIDER_SITE_OTHER): Payer: Medicare Other | Admitting: Internal Medicine

## 2014-10-19 VITALS — BP 146/72 | HR 75 | Temp 97.4°F | Resp 18 | Ht 62.0 in | Wt 140.0 lb

## 2014-10-19 DIAGNOSIS — R252 Cramp and spasm: Secondary | ICD-10-CM

## 2014-10-19 DIAGNOSIS — M189 Osteoarthritis of first carpometacarpal joint, unspecified: Secondary | ICD-10-CM | POA: Diagnosis not present

## 2014-10-19 NOTE — Patient Instructions (Signed)
Continue 1 teaspoon yellow mustard nightly for leg cramps.

## 2014-10-19 NOTE — Progress Notes (Signed)
[name], female   DOB: September 13, 1924, 81 y.o.   MRN: [id number]    Facility  [facility]    Place of Service:   OFFICE   Allergies  Allergen Reactions  . Aspirin   . Codeine   . Iodine     Pt is not aware of this allergy, does not recall much info....//a.c.  . Prednisone   . Sulfa Antibiotics     Chief Complaint  Patient presents with  . Acute Visit    HPI:  81 yo female seen today for leg heaviness sensation x 2 mos. Keeps awake at night and improves with ambulation. Cramps with sitting/lying for prolonged time. 1 tsp yellow mustard helps.  Medications: Patient's Medications  New Prescriptions   No medications on file  Previous Medications   ALBUTEROL (PROVENTIL HFA;VENTOLIN HFA) 108 (90 BASE) MCG/ACT INHALER    Inhale 2 puffs into the lungs every 6 (six) hours as needed for wheezing.   AMLODIPINE (NORVASC) 5 MG TABLET    TAKE 1 TABLET (5 MG TOTAL) BY MOUTH DAILY.   BIMATOPROST (LUMIGAN) 0.01 % SOLN    Place 1 drop into both eyes at bedtime.   BRINZOLAMIDE (AZOPT) 1 % OPHTHALMIC SUSPENSION    Place 1 drop into both eyes 3 (three) times daily.   BUDESONIDE-FORMOTEROL (SYMBICORT) 160-4.5 MCG/ACT INHALER    Inhale 2 puffs into the lungs 2 (two) times daily.   CALCIUM CARBONATE-VITAMIN D (CALTRATE 600+D) 600-400 MG-UNIT PER TABLET    Take 2 tablets by mouth daily.   COENZYME Q10 (COQ10) 50 MG CAPS    Take one tablet by mouth once daily   FLUTICASONE (FLONASE) 50 MCG/ACT NASAL SPRAY    Place 1 spray into the nose daily.   HYDROCHLOROTHIAZIDE (MICROZIDE) 12.5 MG CAPSULE    Take 1 capsule (12.5 mg total) by mouth daily as needed.   LANSOPRAZOLE (PREVACID) 30 MG CAPSULE    Take 1 capsule (30 mg total) by mouth daily.   LORATADINE (CLARITIN) 10 MG TABLET    Take 10 mg by mouth daily.   LOTEPREDNOL (LOTEMAX) 0.5 % OPHTHALMIC SUSPENSION    Place 1 drop into the left eye daily.   MULTIPLE VITAMINS-MINERALS (SENIOR MULTIVITAMIN PLUS) TABS    Take by mouth.   NITROGLYCERIN  (NITROSTAT) 0.4 MG SL TABLET    Place 1 tablet (0.4 mg total) under the tongue every 5 (five) minutes as needed for chest pain.   OMEGA-3 FATTY ACIDS (FISH OIL) 1200 MG CPDR    Take one tablet by mouth once daily   RESTASIS 0.05 % OPHTHALMIC EMULSION    Place 1 drop into both eyes 2 (two) times daily.    RISPERIDONE (RISPERDAL) 0.5 MG TABLET    TAKE 1 TABLET BY MOUTH TWICE A DAY   SYMBICORT 80-4.5 MCG/ACT INHALER    Place 2 puffs into alternate nostrils 2 (two) times daily.   VITAMIN B-12 (CYANOCOBALAMIN) 1000 MCG TABLET    Take 1,000 mcg by mouth daily.   VITAMIN C (ASCORBIC ACID) 500 MG TABLET    Take 500 mg by mouth daily.  Modified Medications   No medications on file  Discontinued Medications   No medications on file     Review of Systems  As above. All other systems reviewed are negative  Filed Vitals:   10/19/14 1136  BP: 146/72  Pulse: 75  Temp: 97.4 F (36.3 C)  TempSrc: Oral  Resp: 18  Height: 5\' 2"  (1.575 m)  Weight: 140  lb (63.504 kg)  SpO2: 97%   Body mass index is 25.6 kg/(m^2).  Physical Exam  CONSTITUTIONAL: Looks frail in NAD. Awake, alert and oriented x 3. Daughter present CVS: Regular rate without murmur, gallop or rub. LUNGS: CTA b/l no wheezing, rales or rhonchi. EXTREMITIES: LLE +1 pitting edema with lateral calf TTP. Distal pulses palpable. No RLE edema. No palpable cords MUSC: L>R knee swelling and grinds with ROM  Labs reviewed: Office Visit on 08/10/2014  Component Date Value Ref Range Status  . Sodium 08/10/2014 141  135 - 145 mEq/L Final  . Potassium 08/10/2014 3.7  3.5 - 5.1 mEq/L Final  . Chloride 08/10/2014 107  96 - 112 mEq/L Final  . CO2 08/10/2014 25  19 - 32 mEq/L Final  . Glucose, Bld 08/10/2014 96  70 - 99 mg/dL Final  . BUN 08/10/2014 15  6 - 23 mg/dL Final  . Creatinine, Ser 08/10/2014 0.9  0.4 - 1.2 mg/dL Final  . Calcium 08/10/2014 9.5  8.4 - 10.5 mg/dL Final  . GFR 08/10/2014 79.05  >60.00 mL/min Final      Assessment/Plan    ICD-9-CM ICD-10-CM   1. Bilateral leg cramps L>R- probable RLS 729.82 H60.7 Basic Metabolic Panel     CK     CANCELED: US Venous Img Lower Unilateral Left  2. Cramps of left lower extremity 729.82 R25.2 US Venous Img Lower Unilateral Left   -- r/o muscle breakdown and electrolyte disturbance  --continue Tsp yellow mustard as needed  --if no improvement in sx's, will consider tx for RLS with requip vs gabapentin  --continue current medications  --f/u as scheduled with Dr Lorelei Pont S. Perlie Gold  [facility] [address] [place], [place] [address] [phone] Office (Wednesdays and Fridays 8 AM - 5 PM) [phone] Cell (Monday-Friday 8 AM - 5 PM)

## 2014-10-20 LAB — BASIC METABOLIC PANEL
BUN / CREAT RATIO: 22 (ref 11–26)
BUN: 20 mg/dL (ref 8–27)
CO2: 24 mmol/L (ref 18–29)
Calcium: 9.8 mg/dL (ref 8.7–10.3)
Chloride: 104 mmol/L (ref 97–108)
Creatinine, Ser: 0.91 mg/dL (ref 0.57–1.00)
GFR, EST AFRICAN AMERICAN: 65 mL/min/{1.73_m2} (ref >59)
GFR, EST NON AFRICAN AMERICAN: 57 mL/min/{1.73_m2} — AB (ref >59)
Glucose: 96 mg/dL (ref 65–99)
Potassium: 4.2 mmol/L (ref 3.5–5.2)
Sodium: 144 mmol/L (ref 134–144)

## 2014-10-20 LAB — CK: Total CK: 139 U/L (ref 24–173)

## 2014-10-21 ENCOUNTER — Other Ambulatory Visit: Payer: Self-pay | Admitting: Internal Medicine

## 2014-10-21 ENCOUNTER — Ambulatory Visit (HOSPITAL_COMMUNITY)
Admission: RE | Admit: 2014-10-21 | Discharge: 2014-10-21 | Disposition: A | Discharge status: Home / Self Care | Payer: Medicare Other | Source: Ambulatory Visit | Attending: Vascular Surgery | Admitting: Vascular Surgery

## 2014-10-21 ENCOUNTER — Telehealth: Payer: Self-pay | Admitting: *Deleted

## 2014-10-21 DIAGNOSIS — R609 Edema, unspecified: Secondary | ICD-10-CM

## 2014-10-21 NOTE — Telephone Encounter (Signed)
[name] [name] with [facility] called with call Report and she was NEGATIVE for DVT

## 2014-10-21 NOTE — Telephone Encounter (Signed)
Patient Notified and Agreed. 

## 2014-10-21 NOTE — Telephone Encounter (Signed)
Tell pt she has no blood clot in leg and to continue yellow mustard for leg cramps

## 2014-10-22 ENCOUNTER — Encounter: Payer: Self-pay | Admitting: Internal Medicine

## 2014-10-31 ENCOUNTER — Ambulatory Visit: Payer: Medicare Other | Admitting: Internal Medicine

## 2014-11-07 ENCOUNTER — Ambulatory Visit (INDEPENDENT_AMBULATORY_CARE_PROVIDER_SITE_OTHER): Payer: Medicare Other | Admitting: Internal Medicine

## 2014-11-07 ENCOUNTER — Encounter: Payer: Self-pay | Admitting: Internal Medicine

## 2014-11-07 VITALS — BP 142/80 | HR 60 | Temp 97.8°F | Resp 10 | Ht 62.0 in | Wt 140.0 lb

## 2014-11-07 DIAGNOSIS — R1310 Dysphagia, unspecified: Secondary | ICD-10-CM | POA: Diagnosis not present

## 2014-11-07 DIAGNOSIS — R252 Cramp and spasm: Secondary | ICD-10-CM | POA: Diagnosis not present

## 2014-11-07 DIAGNOSIS — J4520 Mild intermittent asthma, uncomplicated: Secondary | ICD-10-CM | POA: Diagnosis not present

## 2014-11-07 DIAGNOSIS — M170 Bilateral primary osteoarthritis of knee: Secondary | ICD-10-CM

## 2014-11-07 DIAGNOSIS — I10 Essential (primary) hypertension: Secondary | ICD-10-CM | POA: Diagnosis not present

## 2014-11-07 DIAGNOSIS — H409 Unspecified glaucoma: Secondary | ICD-10-CM | POA: Diagnosis not present

## 2014-11-07 DIAGNOSIS — Z23 Encounter for immunization: Secondary | ICD-10-CM

## 2014-11-07 NOTE — Addendum Note (Signed)
Addended by: Logan Bores on: 11/07/2014 04:01 PM   Modules accepted: Orders

## 2014-11-07 NOTE — Progress Notes (Signed)
[name], female   DOB: Dec 26, 1923, 81 y.o.   MRN: [id number]   Location:  [facility]  Code Status: full code  Allergies  Allergen Reactions  . Aspirin   . Codeine   . Iodine     Pt is not aware of this allergy, does not recall much info....//a.c.  . Prednisone   . Sulfa Antibiotics     Chief Complaint  Patient presents with  . Medical Management of Chronic Issues    4 month follow-up, swallowing concerns (seems so loud when swallowing)    HPI: Patient is a 81 y.o. black female seen in the office today for 4 month f/u.  She c/o swallowing too loudly.  Says it is difficult when she swallows--is not hard to get food or pills or liquid down.  All of the time.  Seems worse in the morning.  Notices a little more with food.  Says water is not as loud.  No pain on swallowing.  No globus sensation.  Now does have to drink water with her food (used to drink liquid only well after eating).  Has never gotten choked.  Taking mustard at bedtime has helped her cramps--restless legs.  Legs feel heavy.    Weight is stable.  BP at goal.  Breathing has been good.  No wheezing.    Asks about medication for arthritis.    Review of Systems:  Review of Systems  Constitutional: Negative for fever and chills.  HENT: Negative for congestion.   Eyes: Negative for blurred vision.       Vision much better and tolerating drops well  Respiratory: Negative for shortness of breath.   Cardiovascular: Positive for leg swelling. Negative for chest pain, palpitations and orthopnea.  Gastrointestinal: Negative for abdominal pain.  Genitourinary: Negative for dysuria.  Musculoskeletal: Positive for myalgias. Negative for falls.  Skin: Negative for rash.  Neurological: Negative for dizziness.  Psychiatric/Behavioral: Positive for depression and hallucinations. Negative for memory loss. The patient is nervous/anxious.     Past Medical History    Diagnosis Date  . Benign essential hypertension   . Osteoarthritis   . GERD (gastroesophageal reflux disease)   . Seasonal allergies   . Asthma   . Glaucoma   . PVD (peripheral vascular disease)   . Anxiety     Past Surgical History  Procedure Laterality Date  . Abdominal hysterectomy  1981  . Tonsillectomy  1953    Social History:   reports that she has never smoked. She does not have any smokeless tobacco history on file. She reports that she does not drink alcohol or use illicit drugs.  Family History  Problem Relation Age of Onset  . Stroke Father     Medications: Patient's Medications  New Prescriptions   No medications on file  Previous Medications   ALBUTEROL (PROVENTIL HFA;VENTOLIN HFA) 108 (90 BASE) MCG/ACT INHALER    Inhale 2 puffs into the lungs every 6 (six) hours as needed for wheezing.   AMLODIPINE (NORVASC) 5 MG TABLET    TAKE 1 TABLET (5 MG TOTAL) BY MOUTH DAILY.   BIMATOPROST (LUMIGAN) 0.01 % SOLN    Place 1 drop into both eyes at bedtime.   BRINZOLAMIDE (AZOPT) 1 % OPHTHALMIC SUSPENSION    Place 1 drop into both eyes 3 (three) times daily.   CALCIUM CARBONATE-VITAMIN D (CALTRATE 600+D) 600-400 MG-UNIT PER TABLET    Take 2 tablets by mouth daily.  COENZYME Q10 (COQ10) 50 MG CAPS    Take one tablet by mouth once daily   FLUTICASONE (FLONASE) 50 MCG/ACT NASAL SPRAY    Place 1 spray into the nose daily.   HYDROCHLOROTHIAZIDE (MICROZIDE) 12.5 MG CAPSULE    Take 1 capsule (12.5 mg total) by mouth daily as needed.   LANSOPRAZOLE (PREVACID) 30 MG CAPSULE    Take 1 capsule (30 mg total) by mouth daily.   LORATADINE (CLARITIN) 10 MG TABLET    Take 10 mg by mouth daily.   LOTEPREDNOL (LOTEMAX) 0.5 % OPHTHALMIC SUSPENSION    Place 1 drop into the left eye daily.   MULTIPLE VITAMINS-MINERALS (SENIOR MULTIVITAMIN PLUS) TABS    Take by mouth.   NITROGLYCERIN (NITROSTAT) 0.4 MG SL TABLET    Place 1 tablet (0.4 mg total) under the tongue every 5 (five) minutes as needed  for chest pain.   OMEGA-3 FATTY ACIDS (FISH OIL) 1200 MG CPDR    Take one tablet by mouth once daily   RESTASIS 0.05 % OPHTHALMIC EMULSION    Place 1 drop into both eyes 2 (two) times daily.    RISPERIDONE (RISPERDAL) 0.5 MG TABLET    TAKE 1 TABLET BY MOUTH TWICE A DAY   SYMBICORT 80-4.5 MCG/ACT INHALER    Place 2 puffs into alternate nostrils 2 (two) times daily.   VITAMIN B-12 (CYANOCOBALAMIN) 1000 MCG TABLET    Take 1,000 mcg by mouth daily.   VITAMIN C (ASCORBIC ACID) 500 MG TABLET    Take 500 mg by mouth daily.  Modified Medications   No medications on file  Discontinued Medications   BUDESONIDE-FORMOTEROL (SYMBICORT) 160-4.5 MCG/ACT INHALER    Inhale 2 puffs into the lungs 2 (two) times daily.     Physical Exam: Filed Vitals:   11/07/14 1501  BP: 142/80  Pulse: 60  Temp: 97.8 F (36.6 C)  TempSrc: Oral  Resp: 10  Height: 5\' 2"  (1.575 m)  Weight: 140 lb (63.504 kg)  SpO2: 95%  Physical Exam  Constitutional: She is oriented to person, place, and time. No distress.  HENT:  Mouth/Throat: Oropharynx is clear and moist. No oropharyngeal exudate.  Cardiovascular: Normal rate, regular rhythm, normal heart sounds and intact distal pulses.   Pulmonary/Chest: Effort normal and breath sounds normal.  Abdominal: Soft. Bowel sounds are normal.  Neurological: She is alert and oriented to person, place, and time.  Skin: Skin is warm and dry.  Psychiatric: She has a normal mood and affect.     Labs reviewed: Basic Metabolic Panel:  Recent Labs  06/09/14 2357 06/10/14 0441 08/10/14 1120 10/19/14 1319  NA  --  142 141 144  K  --  3.9 3.7 4.2  CL  --  108 107 104  CO2  --  25 25 24   GLUCOSE  --  86 96 96  BUN  --  17 15 20   CREATININE  --  0.92 0.9 0.91  CALCIUM  --  8.6 9.5 9.8  TSH 2.510  --   --   --    Liver Function Tests:  Recent Labs  06/10/14 0441  AST 13  ALT 9  ALKPHOS 55  BILITOT 0.5  PROT 5.6*  ALBUMIN 2.8*   No results for input(s): LIPASE, AMYLASE  in the last 8760 hours. No results for input(s): AMMONIA in the last 8760 hours. CBC:  Recent Labs  06/09/14 1944 06/09/14 2258 06/10/14 0441  WBC 6.4 5.3 4.4  NEUTROABS  --   --  2.0  HGB 12.2 11.7* 10.5*  HCT 34.2* 33.0* 30.3*  MCV 88.1 88.2 88.3  PLT 206 208 189   Lipid Panel:  Recent Labs  06/09/14 2258  CHOL 185  HDL 98  LDLCALC 79  TRIG 39  CHOLHDL 1.9   Lab Results  Component Value Date   HGBA1C 5.7* 06/09/2014    Assessment/Plan 1. Cramps of left lower extremity - labs were unremarkable when she saw Dr. Eulas Post - she is continuing with yellow mustard for this  2. Swallowing disorder -does not seem like she truly has pathology here -oropharnyx is clear -advised that if she starts to have difficulty with swallowing pills, food, or liquid or starts to choke, then she should let me know so we can order a swallow study -as far as I am aware, loud swallowing only suggests her throat is dry and she should hydrate more  3. Primary osteoarthritis of both knees -advised to use tylenol arthritis for her pain  4. Asthma, mild intermittent, uncomplicated -no difficulty recently -cont albuterol as needed, claritin, and flonase, and symbicort, also on gerd medication  5. Benign essential hypertension -cont amlodipine, hctz -is at goal  6. Glaucoma -cont restasis for dry eyes and brinzolamide drops  7. Need for vaccination with 13-polyvalent pneumococcal conjugate vaccine -prevnar given  Labs/tests ordered:  No new today Next appt:  3 mos  Hanae Waiters L. Jamarrius Salay, D.O. [facility] [address] [place], [place] [address] Cell Phone (Mon-Fri 8am-5pm):  [phone] On Call:  [phone] & follow prompts after 5pm & weekends Office Phone:  [phone] Office Fax:  [phone]

## 2014-11-07 NOTE — Patient Instructions (Addendum)
Let me know if you have pain or choking problems when you swallow--if you do, we will order a swallow test.    Use tylenol arthritis for your joint pains.    Continue the yellow mustard at bedtime for your restless legs.

## 2014-11-17 ENCOUNTER — Encounter (HOSPITAL_COMMUNITY): Payer: Self-pay | Admitting: Emergency Medicine

## 2014-11-17 ENCOUNTER — Emergency Department (HOSPITAL_COMMUNITY)
Admission: EM | Admit: 2014-11-17 | Discharge: 2014-11-17 | Disposition: A | Discharge status: Home / Self Care | Payer: Medicare Other | Attending: Emergency Medicine | Admitting: Emergency Medicine

## 2014-11-17 ENCOUNTER — Emergency Department (HOSPITAL_COMMUNITY): Payer: Medicare Other

## 2014-11-17 ENCOUNTER — Telehealth: Payer: Self-pay | Admitting: *Deleted

## 2014-11-17 DIAGNOSIS — F419 Anxiety disorder, unspecified: Secondary | ICD-10-CM | POA: Diagnosis not present

## 2014-11-17 DIAGNOSIS — Z7951 Long term (current) use of inhaled steroids: Secondary | ICD-10-CM | POA: Diagnosis not present

## 2014-11-17 DIAGNOSIS — R0789 Other chest pain: Secondary | ICD-10-CM | POA: Diagnosis not present

## 2014-11-17 DIAGNOSIS — R0602 Shortness of breath: Secondary | ICD-10-CM | POA: Diagnosis not present

## 2014-11-17 DIAGNOSIS — Z8739 Personal history of other diseases of the musculoskeletal system and connective tissue: Secondary | ICD-10-CM | POA: Diagnosis not present

## 2014-11-17 DIAGNOSIS — H409 Unspecified glaucoma: Secondary | ICD-10-CM | POA: Insufficient documentation

## 2014-11-17 DIAGNOSIS — K219 Gastro-esophageal reflux disease without esophagitis: Secondary | ICD-10-CM | POA: Diagnosis not present

## 2014-11-17 DIAGNOSIS — R079 Chest pain, unspecified: Secondary | ICD-10-CM | POA: Insufficient documentation

## 2014-11-17 DIAGNOSIS — G8929 Other chronic pain: Secondary | ICD-10-CM | POA: Insufficient documentation

## 2014-11-17 DIAGNOSIS — Z79899 Other long term (current) drug therapy: Secondary | ICD-10-CM | POA: Insufficient documentation

## 2014-11-17 DIAGNOSIS — I10 Essential (primary) hypertension: Secondary | ICD-10-CM | POA: Insufficient documentation

## 2014-11-17 DIAGNOSIS — R002 Palpitations: Secondary | ICD-10-CM | POA: Insufficient documentation

## 2014-11-17 DIAGNOSIS — J45901 Unspecified asthma with (acute) exacerbation: Secondary | ICD-10-CM | POA: Insufficient documentation

## 2014-11-17 DIAGNOSIS — R2243 Localized swelling, mass and lump, lower limb, bilateral: Secondary | ICD-10-CM | POA: Insufficient documentation

## 2014-11-17 LAB — CBC
HCT: 34.3 % — ABNORMAL LOW (ref 36.0–46.0)
Hemoglobin: 11.8 g/dL — ABNORMAL LOW (ref 12.0–15.0)
MCH: 30.6 pg (ref 26.0–34.0)
MCHC: 34.4 g/dL (ref 30.0–36.0)
MCV: 89.1 fL (ref 78.0–100.0)
Platelets: 228 10*3/uL (ref 150–400)
RBC: 3.85 MIL/uL — ABNORMAL LOW (ref 3.87–5.11)
RDW: 15 % (ref 11.5–15.5)
WBC: 5 10*3/uL (ref 4.0–10.5)

## 2014-11-17 LAB — BASIC METABOLIC PANEL
Anion gap: 4 — ABNORMAL LOW (ref 5–15)
BUN: 15 mg/dL (ref 6–23)
CHLORIDE: 104 mmol/L (ref 96–112)
CO2: 31 mmol/L (ref 19–32)
Calcium: 9.3 mg/dL (ref 8.4–10.5)
Creatinine, Ser: 0.85 mg/dL (ref 0.50–1.10)
GFR calc Af Amer: 69 mL/min — ABNORMAL LOW (ref >90)
GFR calc non Af Amer: 59 mL/min — ABNORMAL LOW (ref >90)
GLUCOSE: 109 mg/dL — AB (ref 70–99)
Potassium: 3.9 mmol/L (ref 3.5–5.1)
Sodium: 139 mmol/L (ref 135–145)

## 2014-11-17 LAB — I-STAT TROPONIN, ED
TROPONIN I, POC: 0 ng/mL (ref 0.00–0.08)
Troponin i, poc: 0 ng/mL (ref 0.00–0.08)

## 2014-11-17 MED ORDER — GI COCKTAIL ~~LOC~~
30.0000 mL | Freq: Once | ORAL | Status: AC
  Administered 2014-11-17: 30 mL via ORAL
  Filled 2014-11-17: qty 30

## 2014-11-17 MED ORDER — NITROGLYCERIN 0.4 MG SL SUBL: 0.4000 mg | SUBLINGUAL_TABLET | SUBLINGUAL | Status: DC | PRN

## 2014-11-17 NOTE — ED Notes (Signed)
Pt reports that every night intermittently she has been having L sided cp and palpitations and occasionally in the mornings. Pt sts she gets labored breathing when these episodes happen. Denies any sx at this time.

## 2014-11-17 NOTE — Discharge Instructions (Signed)

## 2014-11-17 NOTE — ED Notes (Signed)
MD at bedside. 

## 2014-11-17 NOTE — Telephone Encounter (Signed)
Patient called and stated that she was having chest pains. Different than before. Pressure and pain in the center of chest and "funny" feelings all over. She stated that she doesn't have SOB. She is not sure if it is coming from her reflux or not but is not able to get any relief. I instructed her to go ahead and go to ER to be evaluated. She agreed.

## 2014-11-17 NOTE — ED Provider Notes (Signed)
CSN: [id number]     Arrival date & time 11/17/14  1529 History   First MD Initiated Contact with Patient 11/17/14 1725     Chief Complaint  Patient presents with  . Chest Pain  . Palpitations     (Consider location/radiation/quality/duration/timing/severity/associated sxs/prior Treatment) Patient is a 81 y.o. female presenting with chest pain.  Chest Pain Pain location:  Substernal area Pain quality comment:  Heaviness Pain radiates to:  Does not radiate Pain radiates to the back: no   Pain severity:  Moderate Onset quality:  Gradual Duration:  2 weeks Timing:  Intermittent Progression:  Unchanged Context: at rest   Relieved by:  Nothing Worsened by:  Exertion Associated symptoms: lower extremity edema (chronically) and shortness of breath   Associated symptoms: no nausea, no near-syncope, no syncope and not vomiting     Past Medical History  Diagnosis Date  . Benign essential hypertension   . Osteoarthritis   . GERD (gastroesophageal reflux disease)   . Seasonal allergies   . Asthma   . Glaucoma   . PVD (peripheral vascular disease)   . Anxiety    Past Surgical History  Procedure Laterality Date  . Abdominal hysterectomy  1981  . Tonsillectomy  1953   Family History  Problem Relation Age of Onset  . Stroke Father    History  Substance Use Topics  . Smoking status: Never Smoker   . Smokeless tobacco: Not on file  . Alcohol Use: No   OB History    No data available     Review of Systems  Respiratory: Positive for shortness of breath.   Cardiovascular: Positive for chest pain. Negative for syncope and near-syncope.  Gastrointestinal: Negative for nausea and vomiting.  All other systems reviewed and are negative.     Allergies  Aspirin; Codeine; Iodine; Prednisone; and Sulfa antibiotics  Home Medications   Prior to Admission medications   Medication Sig Start Date End Date Taking? Authorizing Provider  albuterol (PROVENTIL HFA;VENTOLIN HFA) 108  (90 BASE) MCG/ACT inhaler Inhale 2 puffs into the lungs every 6 (six) hours as needed for wheezing.   Yes Historical Provider, MD  amLODipine (NORVASC) 5 MG tablet TAKE 1 TABLET (5 MG TOTAL) BY MOUTH DAILY. 08/05/14  Yes Tiffany L Reed, DO  bimatoprost (LUMIGAN) 0.01 % SOLN Place 1 drop into both eyes at bedtime.   Yes Historical Provider, MD  brinzolamide (AZOPT) 1 % ophthalmic suspension Place 1 drop into both eyes 3 (three) times daily.   Yes Historical Provider, MD  Calcium Carbonate-Vitamin D (CALTRATE 600+D) 600-400 MG-UNIT per tablet Take 2 tablets by mouth daily.   Yes Historical Provider, MD  Coenzyme Q10 (COQ10) 50 MG CAPS Take one tablet by mouth once daily   Yes Historical Provider, MD  fluticasone (FLONASE) 50 MCG/ACT nasal spray Place 1 spray into the nose daily.   Yes Historical Provider, MD  lansoprazole (PREVACID) 30 MG capsule Take 1 capsule (30 mg total) by mouth daily. 05/28/13  Yes Tiffany L Reed, DO  loteprednol (LOTEMAX) 0.5 % ophthalmic suspension Place 1 drop into the left eye daily.   Yes Historical Provider, MD  Multiple Vitamins-Minerals (SENIOR MULTIVITAMIN PLUS) TABS Take by mouth.   Yes Historical Provider, MD  Omega-3 Fatty Acids (FISH OIL) 1200 MG CPDR Take one tablet by mouth once daily   Yes Historical Provider, MD  RESTASIS 0.05 % ophthalmic emulsion Place 1 drop into both eyes 2 (two) times daily.  06/02/14  Yes Historical Provider, MD  risperiDONE (RISPERDAL) 0.5 MG tablet TAKE 1 TABLET BY MOUTH TWICE A DAY 10/19/14  Yes Tiffany L Reed, DO  SYMBICORT 80-4.5 MCG/ACT inhaler Place 2 puffs into alternate nostrils 2 (two) times daily. 10/17/14  Yes Historical Provider, MD  vitamin B-12 (CYANOCOBALAMIN) 1000 MCG tablet Take 1,000 mcg by mouth daily.   Yes Historical Provider, MD  vitamin C (ASCORBIC ACID) 500 MG tablet Take 500 mg by mouth daily.   Yes Historical Provider, MD  hydrochlorothiazide (MICROZIDE) 12.5 MG capsule Take 1 capsule (12.5 mg total) by mouth daily as  needed. 06/15/14   Brett Canales, PA-C  nitroGLYCERIN (NITROSTAT) 0.4 MG SL tablet Place 1 tablet (0.4 mg total) under the tongue every 5 (five) minutes as needed for chest pain. 11/17/14   Debby Freiberg, MD   BP 131/61 mmHg  Pulse 96  Temp(Src) 97.5 F (36.4 C) (Oral)  Resp 20  Ht 5\' 2"  (1.575 m)  Wt 140 lb (63.504 kg)  BMI 25.60 kg/m2  SpO2 93% Physical Exam  Constitutional: She is oriented to person, place, and time. She appears well-developed and well-nourished.  HENT:  Head: Normocephalic and atraumatic.  Right Ear: External ear normal.  Left Ear: External ear normal.  Eyes: Conjunctivae and EOM are normal. Pupils are equal, round, and reactive to light.  Neck: Normal range of motion. Neck supple.  Cardiovascular: Normal rate, regular rhythm, normal heart sounds and intact distal pulses.   Pulmonary/Chest: Effort normal and breath sounds normal.  Abdominal: Soft. Bowel sounds are normal. There is no tenderness.  Musculoskeletal: Normal range of motion.       Right lower leg: She exhibits edema.       Left lower leg: She exhibits edema.  Neurological: She is alert and oriented to person, place, and time.  Skin: Skin is warm and dry.  Vitals reviewed.   ED Course  Procedures (including critical care time) Labs Review Labs Reviewed  CBC - Abnormal; Notable for the following:    RBC 3.85 (*)    Hemoglobin 11.8 (*)    HCT 34.3 (*)    All other components within normal limits  BASIC METABOLIC PANEL - Abnormal; Notable for the following:    Glucose, Bld 109 (*)    GFR calc non Af Amer 59 (*)    GFR calc Af Amer 69 (*)    Anion gap 4 (*)    All other components within normal limits  I-STAT TROPOININ, ED  Randolm Idol, ED    Imaging Review Dg Chest 2 View  11/17/2014   CLINICAL DATA:  Chest pressure and tightness for 2 weeks. Initial encounter.  EXAM: CHEST  2 VIEW  COMPARISON:  06/09/2014  FINDINGS: Cardiac silhouette is mildly enlarged. Normal mediastinal and hilar  contours.  Lungs are clear.  No pleural effusion or pneumothorax.  Bony thorax is demineralized but intact.  IMPRESSION: No active cardiopulmonary disease.   Electronically Signed   By: Lajean Manes M.D.   On: 11/17/2014 16:56     EKG Interpretation   Date/Time:  Thursday November 17 2014 15:35:43 EST Ventricular Rate:  66 PR Interval:  234 QRS Duration: 126 QT Interval:  430 QTC Calculation: 450 R Axis:   105 Text Interpretation:  Sinus rhythm with 1st degree A-V block Right atrial  enlargement Right bundle branch block Abnormal ECG No significant change  since last tracing Confirmed by Debby Freiberg [phone]) on 11/17/2014  5:55:05 PM      MDM   Final diagnoses:  Chest pain, unspecified chest pain type    81 y.o. female with pertinent PMH of anxiety, asthma, chronic chest pain presents with recurrent chest pain.  Pain primarily present at night, has not had symptoms for the last 48 hours.  No clear precipitant for current visit as opposed to anytime within the last week.  On arrival vitals and physical exam as above.  Pt was admitted last year for identical symptoms, at that time declined stress test and catheterization. She denies same treatment today. Given her symptoms are chronic, unchanged from prior, and patient has no change in what she would want done do not feel that admission is warranted, especially as she has not had symptoms in 48 hours. I encouraged her strongly to pursue stress testing and potentially catheterization by cardiology, patient stated she would think about it. Discharged home to follow-up with cardiology in stable condition, still chest pain-free..    I have reviewed all laboratory and imaging studies if ordered as above  1. Chest pain, unspecified chest pain type         Debby Freiberg, MD 11/17/14 2204

## 2014-11-18 ENCOUNTER — Encounter: Payer: Self-pay | Admitting: Cardiology

## 2014-11-19 ENCOUNTER — Other Ambulatory Visit: Payer: Self-pay | Admitting: Internal Medicine

## 2014-12-01 ENCOUNTER — Ambulatory Visit (INDEPENDENT_AMBULATORY_CARE_PROVIDER_SITE_OTHER): Payer: Medicare Other | Admitting: Internal Medicine

## 2014-12-01 ENCOUNTER — Encounter: Payer: Self-pay | Admitting: Internal Medicine

## 2014-12-01 VITALS — BP 140/60 | HR 73 | Temp 97.4°F | Resp 18 | Ht 62.0 in | Wt 141.0 lb

## 2014-12-01 DIAGNOSIS — M2010 Hallux valgus (acquired), unspecified foot: Secondary | ICD-10-CM | POA: Diagnosis not present

## 2014-12-01 DIAGNOSIS — R252 Cramp and spasm: Secondary | ICD-10-CM | POA: Diagnosis not present

## 2014-12-01 DIAGNOSIS — I10 Essential (primary) hypertension: Secondary | ICD-10-CM | POA: Diagnosis not present

## 2014-12-01 DIAGNOSIS — R609 Edema, unspecified: Secondary | ICD-10-CM

## 2014-12-01 DIAGNOSIS — K219 Gastro-esophageal reflux disease without esophagitis: Secondary | ICD-10-CM | POA: Diagnosis not present

## 2014-12-01 MED ORDER — LANSOPRAZOLE 3 MG/ML SUSP: 30.0000 mg | Freq: Every day | ORAL | Status: DC

## 2014-12-01 NOTE — Progress Notes (Signed)
[name], female   DOB: [birth year], 81 y.o.   MRN: [id number]   Location:  [facility]  Code Status: full code     Allergies  Allergen Reactions  . Aspirin   . Codeine   . Iodine     Pt is not aware of this allergy, does not recall much info....//a.c.  . Prednisone   . Sulfa Antibiotics     Chief Complaint  Patient presents with  . Hospitalization Follow-up    HPI: Patient is a 81 y.o. female seen in the office today for hospital followup of chest pain.   Was seen in emergency department for chest pain that was determined to be noncardiac. She has not had any more chest pains since.  Her BP in the ED was 184/55 and she was instructed to followup with this office for that. Checked at home a few times immediately following her ED visit and gradually decreased from 170's over 70's to 160's over 60's. This past Sunday it was 125/44. Today it is 140/60. Denies any abnormal stress, changes in appetite, malaise or fatigue. Denies headaches, dizziness, syncope.   She has had some muscular cramping when bending over to put on her stockings. Relieved when she sits up and relaxes. Does not occur at any other times.   She was given a GI cocktail (maalox, lidocaine, donnatol) in the ED that relieved her chest pain. She is inquiring if we can prescribe it. She feels she may need a change to her current regimen for reflux.     Review of Systems:  Review of Systems  Constitutional: Negative for fever, chills and weight loss.  Respiratory: Negative for cough, shortness of breath and wheezing.   Cardiovascular: Positive for palpitations and leg swelling. Negative for chest pain and orthopnea.  Gastrointestinal: Positive for heartburn. Negative for nausea, vomiting, abdominal pain, diarrhea and constipation.  Neurological: Negative for headaches.    Past Medical History  Diagnosis Date  . Benign essential hypertension   .  Osteoarthritis   . GERD (gastroesophageal reflux disease)   . Seasonal allergies   . Asthma   . Glaucoma   . PVD (peripheral vascular disease)   . Anxiety     Past Surgical History  Procedure Laterality Date  . Abdominal hysterectomy  1981  . Tonsillectomy  1953    Social History:   reports that she has never smoked. She does not have any smokeless tobacco history on file. She reports that she does not drink alcohol or use illicit drugs.  Family History  Problem Relation Age of Onset  . Stroke Father     Medications: Patient's Medications  New Prescriptions   No medications on file  Previous Medications   ALBUTEROL (PROVENTIL HFA;VENTOLIN HFA) 108 (90 BASE) MCG/ACT INHALER    Inhale 2 puffs into the lungs every 6 (six) hours as needed for wheezing.   AMLODIPINE (NORVASC) 5 MG TABLET    TAKE 1 TABLET (5 MG TOTAL) BY MOUTH DAILY.   BIMATOPROST (LUMIGAN) 0.01 % SOLN    Place 1 drop into both eyes at bedtime.   BRINZOLAMIDE (AZOPT) 1 % OPHTHALMIC SUSPENSION    Place 1 drop into both eyes 3 (three) times daily.   CALCIUM CARBONATE-VITAMIN D (CALTRATE 600+D) 600-400 MG-UNIT PER TABLET    Take 2 tablets by mouth daily.   COENZYME Q10 (COQ10) 50 MG CAPS    Take one tablet by mouth once daily  FLUTICASONE (FLONASE) 50 MCG/ACT NASAL SPRAY    Place 1 spray into the nose daily.   HYDROCHLOROTHIAZIDE (MICROZIDE) 12.5 MG CAPSULE    Take 1 capsule (12.5 mg total) by mouth daily as needed.   LANSOPRAZOLE (PREVACID) 30 MG CAPSULE    Take 1 capsule (30 mg total) by mouth daily.   LOTEPREDNOL (LOTEMAX) 0.5 % OPHTHALMIC SUSPENSION    Place 1 drop into the left eye daily.   MULTIPLE VITAMINS-MINERALS (SENIOR MULTIVITAMIN PLUS) TABS    Take by mouth.   NITROGLYCERIN (NITROSTAT) 0.4 MG SL TABLET    Place 1 tablet (0.4 mg total) under the tongue every 5 (five) minutes as needed for chest pain.   OMEGA-3 FATTY ACIDS (FISH OIL) 1200 MG CPDR    Take one tablet by mouth once daily   RESTASIS 0.05 %  OPHTHALMIC EMULSION    Place 1 drop into both eyes 2 (two) times daily.    RISPERIDONE (RISPERDAL) 0.5 MG TABLET    TAKE 1 TABLET BY MOUTH TWICE A DAY   SYMBICORT 80-4.5 MCG/ACT INHALER    Place 2 puffs into alternate nostrils 2 (two) times daily.   VITAMIN B-12 (CYANOCOBALAMIN) 1000 MCG TABLET    Take 1,000 mcg by mouth daily.   VITAMIN C (ASCORBIC ACID) 500 MG TABLET    Take 500 mg by mouth daily.  Modified Medications   No medications on file  Discontinued Medications   RISPERIDONE (RISPERDAL) 0.5 MG TABLET    TAKE 1 TABLET BY MOUTH TWICE A DAY     Physical Exam: Filed Vitals:   12/01/14 1508  BP: 140/60  Pulse: 73  Temp: 97.4 F (36.3 C)  TempSrc: Oral  Resp: 18  Height: 5\' 2"  (1.575 m)  Weight: 141 lb (63.957 kg)  SpO2: 97%   Physical Exam  Constitutional: She appears well-developed and well-nourished.  Cardiovascular: Normal rate, regular rhythm and intact distal pulses.   Murmur heard. Bilateral lower extremity edema  Pulmonary/Chest: Effort normal and breath sounds normal. No respiratory distress. She has no wheezes. She has no rales. She exhibits no tenderness.  Abdominal: Soft. Bowel sounds are normal. She exhibits no distension. There is no tenderness.  Musculoskeletal:  Bunions of bilateral feet, diminished sensation also, calluses present over metatarsal regions bilaterallly  Neurological: She is alert.  Skin: Skin is warm and dry.  Psychiatric: She has a normal mood and affect. Her behavior is normal.    Labs reviewed: Basic Metabolic Panel:  Recent Labs  06/09/14 2357  08/10/14 1120 10/19/14 1319 11/17/14 1604  NA  --   < > 141 144 139  K  --   < > 3.7 4.2 3.9  CL  --   < > 107 104 104  CO2  --   < > 25 24 31   GLUCOSE  --   < > 96 96 109*  BUN  --   < > 15 20 15   CREATININE  --   < > 0.9 0.91 0.85  CALCIUM  --   < > 9.5 9.8 9.3  TSH 2.510  --   --   --   --   < > = values in this interval not displayed. Liver Function Tests:  Recent Labs   06/10/14 0441  AST 13  ALT 9  ALKPHOS 55  BILITOT 0.5  PROT 5.6*  ALBUMIN 2.8*   No results for input(s): LIPASE, AMYLASE in the last 8760 hours. No results for input(s): AMMONIA in the last 8760 hours.  CBC:  Recent Labs  06/09/14 2258 06/10/14 0441 11/17/14 1604  WBC 5.3 4.4 5.0  NEUTROABS  --  2.0  --   HGB 11.7* 10.5* 11.8*  HCT 33.0* 30.3* 34.3*  MCV 88.2 88.3 89.1  PLT 208 189 228   Lipid Panel:  Recent Labs  06/09/14 2258  CHOL 185  HDL 98  LDLCALC 79  TRIG 39  CHOLHDL 1.9   Lab Results  Component Value Date   HGBA1C 5.7* 06/09/2014   Assessment/Plan 1. Gastroesophageal reflux disease without esophagitis -change to liquid prevacid to help with her persistent chest pains -pt refuses stress test due to fact that she wouldn't get cath anyway -discussed not using maalox due to potential constipation   2. Benign essential hypertension -bp at goal with current regimen  3. Edema -cont compression hose  4. Muscle cramps -positional, no changes to routine  5.  Bunions bilateral feet:  [facility]erred to [facility] treatment--has had difficulty finding the best shoes and also has calluses and burning on the soles of her feet  Labs/tests ordered:   Orders Placed This Encounter  Procedures  . Ambulatory referral to Podiatry    Referral Priority:  Routine    Referral Type:  Consultation    Referral Reason:  Specialty Services Required    Requested Specialty:  Podiatry    Number of Visits Requested:  1   Advised to f/u with Dr. Radford Pax as per hospital d/c but again pt's goals of care are not consistent with wanting further cardiac workup  Next appt:  3 mos (has scheduled in May already)  Latisa Belay L. Josphine Laffey, D.O. [facility] [address], [place] [address] Cell Phone (Mon-Fri 8am-5pm):  [phone] On Call:  [phone] & follow prompts after 5pm & weekends Office Phone:   [phone] Office Fax:  [phone]

## 2014-12-01 NOTE — Patient Instructions (Signed)
We'll try you on the prevacid liquid and see if that works better.

## 2014-12-09 ENCOUNTER — Telehealth: Payer: Self-pay | Admitting: *Deleted

## 2014-12-09 NOTE — Telephone Encounter (Signed)
Patient called and stated that her BP has not gone down much. BP is staying around 153/73. No increase stress nor chest pains, no headache but in the morning when she gets up it feels alittle woozy.  Please Advise.

## 2014-12-12 NOTE — Telephone Encounter (Signed)
LMOM to return call.

## 2014-12-12 NOTE — Telephone Encounter (Signed)
Increase hydrochlorothiazide from 12.5mg  daily to 25mg  daily and continue to check blood pressure.  If still over 185 systolic, she should call back.

## 2014-12-14 MED ORDER — HYDROCHLOROTHIAZIDE 12.5 MG PO CAPS: ORAL_CAPSULE | ORAL | Status: DC

## 2014-12-14 NOTE — Telephone Encounter (Signed)
Patient has not been taking Hydrochlorothiazide. Faxed in a Rx for the 12.5mg  to pharmacy since patient has not been taking this medication. Patient will continue with BP readings and call if they do not go below 150 top number.

## 2014-12-21 ENCOUNTER — Encounter: Payer: Self-pay | Admitting: Podiatry

## 2014-12-21 ENCOUNTER — Ambulatory Visit (INDEPENDENT_AMBULATORY_CARE_PROVIDER_SITE_OTHER): Payer: Medicare Other | Admitting: Podiatry

## 2014-12-21 VITALS — BP 130/66 | HR 69 | Resp 16

## 2014-12-21 DIAGNOSIS — G5791 Unspecified mononeuropathy of right lower limb: Secondary | ICD-10-CM | POA: Diagnosis not present

## 2014-12-21 DIAGNOSIS — B351 Tinea unguium: Secondary | ICD-10-CM

## 2014-12-21 DIAGNOSIS — Q828 Other specified congenital malformations of skin: Secondary | ICD-10-CM

## 2014-12-21 DIAGNOSIS — M1990 Unspecified osteoarthritis, unspecified site: Secondary | ICD-10-CM | POA: Diagnosis not present

## 2014-12-21 DIAGNOSIS — M79676 Pain in unspecified toe(s): Secondary | ICD-10-CM

## 2014-12-21 DIAGNOSIS — G5793 Unspecified mononeuropathy of bilateral lower limbs: Secondary | ICD-10-CM

## 2014-12-21 DIAGNOSIS — G5792 Unspecified mononeuropathy of left lower limb: Secondary | ICD-10-CM | POA: Diagnosis not present

## 2014-12-21 NOTE — Progress Notes (Signed)
   Subjective:    [name], female    DOB: 16-Jan-1924, 81 y.o.   MRN: [id number]  HPI 81 year old phenol presents the office they with complaints of painful, elongated toenails as well as painful calluses on the bottom of her right foot. She states that she periodically tries to trim both areas herself however she is unable to this time. She denies any redness or drainage along the nail sites or the site of the calluses. These areas she has no other areas of tenderness. She currently denies any numbness or tingling to her feet or any burning sensations. She is previsit been diagnosed with neuropathy. Denies any history of open lesions/ulcerations.   Review of Systems  Gastrointestinal: Positive for constipation.  Musculoskeletal: Positive for myalgias and gait problem.  All other systems reviewed and are negative.      Objective:   Physical Exam AAO 3, NAD DP/PT pulses palpable, CRT less than 3 seconds Protective sensation appears to be intact with Derrel Nip monochromic, vibratory sensation intact, Achilles tendon reflex intact. Nails are hypertrophic, dystrophic, elongated, brittle, discolored 10. The subjective tenderness along nails 1-5 bilaterally. No swelling erythema or drainage. Right foot submetatarsal one and 5 annular hyperkeratotic lesions. Upon debridement there is no underlying ulceration, drainage, clinical signs of infection. No other open lesions or pre-ulcerative lesions are identified bilaterally. No interdigital maceration. No other areas of tenderness to bilateral lower extremities. No overlying edema, erythema, increase in warmth. No pain with calf compression, swelling, warmth, erythema.     Assessment & Plan:  81 year old female with symptomatic onychomycosis, hyperkeratotic lesions -Treatment options discussed included alternatives, risks, complications. -Nail sharply debrided 10 without complication/bleeding. -Hyperkeratotic lesion  sharply debrided 2 without complication/bleeding. -Discussed the importance of daily foot inspection. -Follow-up in 3 months or sooner if any problems are to arise.

## 2014-12-22 NOTE — Telephone Encounter (Signed)
Patient called to confirm what BP medications she is suppose to be taking. She wanted to make sure she is suppose to be taking Norvasc and HCTZ. Confirmed.

## 2015-01-05 ENCOUNTER — Telehealth: Payer: Self-pay | Admitting: *Deleted

## 2015-01-05 NOTE — Telephone Encounter (Signed)
Ok, she needs an appointment to change her bp medication.

## 2015-01-05 NOTE — Telephone Encounter (Signed)
Patient called and stated that the new increase on the HCTZ has brought BP down to 134/63. But ever since taking the medication she has had headaches and lightheadedness and feels "dryed out" cant get enough to drink. Please Advise.

## 2015-01-06 NOTE — Telephone Encounter (Signed)
Patient Notified and appointment scheduled for 4/516 with [facility].

## 2015-01-10 ENCOUNTER — Ambulatory Visit (INDEPENDENT_AMBULATORY_CARE_PROVIDER_SITE_OTHER): Payer: Medicare Other | Admitting: Nurse Practitioner

## 2015-01-10 ENCOUNTER — Encounter: Payer: Self-pay | Admitting: Nurse Practitioner

## 2015-01-10 VITALS — BP 122/66 | HR 70 | Temp 97.5°F | Resp 18 | Ht 62.0 in | Wt 139.0 lb

## 2015-01-10 DIAGNOSIS — I10 Essential (primary) hypertension: Secondary | ICD-10-CM

## 2015-01-10 DIAGNOSIS — R609 Edema, unspecified: Secondary | ICD-10-CM

## 2015-01-10 DIAGNOSIS — R600 Localized edema: Secondary | ICD-10-CM

## 2015-01-10 NOTE — Progress Notes (Signed)
[name], female   DOB: [birth year], 81 y.o.   MRN: [id number]    PCP: Hollace Kinnier, DO  Allergies  Allergen Reactions  . Aspirin   . Codeine   . Iodine     Pt is not aware of this allergy, does not recall much info....//a.c.  . Prednisone   . Sulfa Antibiotics     Chief Complaint  Patient presents with  . Acute Visit    B/P medication concerns, HCTZ is making patient dizzy and fatigue. Patient stopped medication last Thursday.   . Night Sweats    Patient c/o night sweats off/on x couple of months.      HPI: Patient is a 81 y.o. female seen in the office today to follow up blood pressure. Pt was not taking HCTZ routinely but called the office in March to confirm medications and was told to start HCTZ- it appears this was PRN for swelling when looking back. She then started taking routinely. Never increased it to 25 mg (which was instructed after a phone call due to elevated blood pressure). When she started taking the HCTZ 12.5 mg, pt reported she had a headache and kept getting more intense. Then she reported light headedness. Called here and stopped taking medication. Also Taking norvasc daily. Reports blood pressure at home has not changed with stopping medication. Reviewed log, 139/63, 111/62, 140/62, 137/67, 134/72 Reports symptoms of dizziness, headaches has slowly gotten better since stopping medication. Currently not having symptoms. Feels weak, better every day.   Review of Systems:  Review of Systems  Constitutional: Positive for fatigue. Negative for fever and chills.  Respiratory: Negative for cough, shortness of breath and wheezing.   Cardiovascular: Negative for chest pain, palpitations and leg swelling.  Gastrointestinal: Negative for nausea, vomiting, abdominal pain, diarrhea and constipation.  Neurological: Positive for dizziness (improved) and headaches (have improved). Negative for weakness.    Past Medical History  Diagnosis Date  . Benign  essential hypertension   . Osteoarthritis   . GERD (gastroesophageal reflux disease)   . Seasonal allergies   . Asthma   . Glaucoma   . PVD (peripheral vascular disease)   . Anxiety    Past Surgical History  Procedure Laterality Date  . Abdominal hysterectomy  1981  . Tonsillectomy  1953   Social History:   reports that she has never smoked. She does not have any smokeless tobacco history on file. She reports that she does not drink alcohol or use illicit drugs.  Family History  Problem Relation Age of Onset  . Stroke Father     Medications: Patient's Medications  New Prescriptions   No medications on file  Previous Medications   ALBUTEROL (PROVENTIL HFA;VENTOLIN HFA) 108 (90 BASE) MCG/ACT INHALER    Inhale 2 puffs into the lungs every 6 (six) hours as needed for wheezing.   AMLODIPINE (NORVASC) 5 MG TABLET    TAKE 1 TABLET (5 MG TOTAL) BY MOUTH DAILY.   BIMATOPROST (LUMIGAN) 0.01 % SOLN    Place 1 drop into both eyes at bedtime.   BRINZOLAMIDE (AZOPT) 1 % OPHTHALMIC SUSPENSION    Place 1 drop into both eyes 3 (three) times daily.   CALCIUM CARBONATE-VITAMIN D (CALTRATE 600+D) 600-400 MG-UNIT PER TABLET    Take 2 tablets by mouth daily.   COENZYME Q10 (COQ10) 50 MG CAPS    Take one tablet by mouth once daily   FLUTICASONE (FLONASE) 50 MCG/ACT NASAL SPRAY    Place 1 spray  into the nose daily.   LANSOPRAZOLE (PREVACID) 3 MG/ML SUSP ORAL SUSPENSION    Take 10 mLs (30 mg total) by mouth daily before supper.   LOTEPREDNOL (LOTEMAX) 0.5 % OPHTHALMIC SUSPENSION    Place 1 drop into the left eye daily.   MULTIPLE VITAMINS-MINERALS (SENIOR MULTIVITAMIN PLUS) TABS    Take by mouth.   NITROGLYCERIN (NITROSTAT) 0.4 MG SL TABLET    Place 1 tablet (0.4 mg total) under the tongue every 5 (five) minutes as needed for chest pain.   OMEGA-3 FATTY ACIDS (FISH OIL) 1200 MG CPDR    Take one tablet by mouth once daily   RESTASIS 0.05 % OPHTHALMIC EMULSION    Place 1 drop into both eyes 2 (two) times  daily.    RISPERIDONE (RISPERDAL) 0.5 MG TABLET    TAKE 1 TABLET BY MOUTH TWICE A DAY   SYMBICORT 80-4.5 MCG/ACT INHALER    Place 2 puffs into alternate nostrils 2 (two) times daily.   TIVORBEX 20 MG CAPS    Take 1 capsule by mouth daily.   VITAMIN B-12 (CYANOCOBALAMIN) 1000 MCG TABLET    Take 1,000 mcg by mouth daily.   VITAMIN C (ASCORBIC ACID) 500 MG TABLET    Take 500 mg by mouth daily.  Modified Medications   No medications on file  Discontinued Medications   HYDROCHLOROTHIAZIDE (MICROZIDE) 12.5 MG CAPSULE    Take one tablet by mouth once daily for blood pressure     Physical Exam:  Filed Vitals:   01/10/15 1011  BP: 122/66  Pulse: 70  Temp: 97.5 F (36.4 C)  TempSrc: Oral  Resp: 18  Height: 5\' 2"  (1.575 m)  Weight: 139 lb (63.05 kg)  SpO2: 97%    Physical Exam  Constitutional: She is oriented to person, place, and time. She appears well-developed and well-nourished.  Cardiovascular: Normal rate, regular rhythm and intact distal pulses.   Murmur heard. Bilateral lower extremity edema  Pulmonary/Chest: Effort normal and breath sounds normal.  Abdominal: Soft. Bowel sounds are normal.  Neurological: She is alert and oriented to person, place, and time.  Skin: Skin is warm and dry.  Psychiatric: She has a normal mood and affect.    Labs reviewed: Basic Metabolic Panel:  Recent Labs  06/09/14 2357  08/10/14 1120 10/19/14 1319 11/17/14 1604  NA  --   < > 141 144 139  K  --   < > 3.7 4.2 3.9  CL  --   < > 107 104 104  CO2  --   < > 25 24 31   GLUCOSE  --   < > 96 96 109*  BUN  --   < > 15 20 15   CREATININE  --   < > 0.9 0.91 0.85  CALCIUM  --   < > 9.5 9.8 9.3  TSH 2.510  --   --   --   --   < > = values in this interval not displayed. Liver Function Tests:  Recent Labs  06/10/14 0441  AST 13  ALT 9  ALKPHOS 55  BILITOT 0.5  PROT 5.6*  ALBUMIN 2.8*   No results for input(s): LIPASE, AMYLASE in the last 8760 hours. No results for input(s): AMMONIA  in the last 8760 hours. CBC:  Recent Labs  06/09/14 2258 06/10/14 0441 11/17/14 1604  WBC 5.3 4.4 5.0  NEUTROABS  --  2.0  --   HGB 11.7* 10.5* 11.8*  HCT 33.0* 30.3* 34.3*  MCV 88.2  88.3 89.1  PLT 208 189 228   Lipid Panel:  Recent Labs  06/09/14 2258  CHOL 185  HDL 98  LDLCALC 79  TRIG 39  CHOLHDL 1.9   TSH:  Recent Labs  06/09/14 2357  TSH 2.510   A1C: Lab Results  Component Value Date   HGBA1C 5.7* 06/09/2014     Assessment/Plan 1. Benign essential hypertension -to cont off HCTZ, and cont taking norvasc, blood pressure well controlled on Norvasc only at this time.  -will follow up lab work pt with hx of hypokalemia from diuretic and make sure anemia is not contributing to weakness - CBC with Differential/Platelet - Basic metabolic panel  2. Edema extremities Resolved, discussed compression hose and elevation.

## 2015-01-10 NOTE — Progress Notes (Signed)
Failed clock drawing  

## 2015-01-10 NOTE — Patient Instructions (Signed)
STOP diuretic, Cont Norvasc 5 mg daily   Keep appt with Dr Mariea Clonts in May

## 2015-01-11 LAB — CBC WITH DIFFERENTIAL/PLATELET
BASOS ABS: 0 10*3/uL (ref 0.0–0.2)
Basos: 1 %
EOS ABS: 0.1 10*3/uL (ref 0.0–0.4)
Eos: 2 %
HEMATOCRIT: 36.3 % (ref 34.0–46.6)
Hemoglobin: 12.1 g/dL (ref 11.1–15.9)
IMMATURE GRANULOCYTES: 0 %
Immature Grans (Abs): 0 10*3/uL (ref 0.0–0.1)
LYMPHS ABS: 1.5 10*3/uL (ref 0.7–3.1)
Lymphs: 30 %
MCH: 30.3 pg (ref 26.6–33.0)
MCHC: 33.3 g/dL (ref 31.5–35.7)
MCV: 91 fL (ref 79–97)
Monocytes Absolute: 0.6 10*3/uL (ref 0.1–0.9)
Monocytes: 11 %
Neutrophils Absolute: 3 10*3/uL (ref 1.4–7.0)
Neutrophils Relative %: 56 %
PLATELETS: 256 10*3/uL (ref 150–379)
RBC: 3.99 x10E6/uL (ref 3.77–5.28)
RDW: 14.4 % (ref 12.3–15.4)
WBC: 5.2 10*3/uL (ref 3.4–10.8)

## 2015-01-11 LAB — BASIC METABOLIC PANEL
BUN/Creatinine Ratio: 20 (ref 11–26)
BUN: 20 mg/dL (ref 8–27)
CO2: 23 mmol/L (ref 18–29)
Calcium: 9.6 mg/dL (ref 8.7–10.3)
Chloride: 105 mmol/L (ref 97–108)
Creatinine, Ser: 1.01 mg/dL — ABNORMAL HIGH (ref 0.57–1.00)
GFR calc Af Amer: 57 mL/min/{1.73_m2} — ABNORMAL LOW (ref >59)
GFR calc non Af Amer: 50 mL/min/{1.73_m2} — ABNORMAL LOW (ref >59)
Glucose: 120 mg/dL — ABNORMAL HIGH (ref 65–99)
Potassium: 4 mmol/L (ref 3.5–5.2)
Sodium: 144 mmol/L (ref 134–144)

## 2015-01-18 DIAGNOSIS — M1990 Unspecified osteoarthritis, unspecified site: Secondary | ICD-10-CM | POA: Diagnosis not present

## 2015-02-05 ENCOUNTER — Other Ambulatory Visit: Payer: Self-pay | Admitting: Internal Medicine

## 2015-02-06 ENCOUNTER — Encounter: Payer: Medicare Other | Admitting: Internal Medicine

## 2015-02-09 ENCOUNTER — Ambulatory Visit: Payer: Medicare Other | Admitting: Internal Medicine

## 2015-02-15 DIAGNOSIS — J4540 Moderate persistent asthma, uncomplicated: Secondary | ICD-10-CM | POA: Diagnosis not present

## 2015-02-15 DIAGNOSIS — J3089 Other allergic rhinitis: Secondary | ICD-10-CM | POA: Diagnosis not present

## 2015-02-16 DIAGNOSIS — H16223 Keratoconjunctivitis sicca, not specified as Sjogren's, bilateral: Secondary | ICD-10-CM | POA: Diagnosis not present

## 2015-02-16 DIAGNOSIS — H02403 Unspecified ptosis of bilateral eyelids: Secondary | ICD-10-CM | POA: Diagnosis not present

## 2015-02-16 DIAGNOSIS — H4011X2 Primary open-angle glaucoma, moderate stage: Secondary | ICD-10-CM | POA: Diagnosis not present

## 2015-02-16 NOTE — Progress Notes (Signed)
Cardiology Office Note   Date:  02/17/2015   ID:  [name], [name] 06-Jan-1924, MRN [id number]  PCP:  Hollace Kinnier, DO    Chief Complaint  Patient presents with  . Follow-up    hypertension  . Leg Swelling      History of Present Illness: [name] [name] is a 81 y.o. female with a history of HTN and asthma who presents today for followup of LE edema and SOB.  She is doing well today. She denies any chest pain, SOB, DOE, dizziness, palpitations or syncope. She occasionally has some LE edema.     Past Medical History  Diagnosis Date  . Benign essential hypertension   . Osteoarthritis   . GERD (gastroesophageal reflux disease)   . Seasonal allergies   . Asthma   . Glaucoma   . PVD (peripheral vascular disease)   . Anxiety     Past Surgical History  Procedure Laterality Date  . Abdominal hysterectomy  1981  . Tonsillectomy  1953     Current Outpatient Prescriptions  Medication Sig Dispense Refill  . albuterol (PROVENTIL HFA;VENTOLIN HFA) 108 (90 BASE) MCG/ACT inhaler Inhale 2 puffs into the lungs every 6 (six) hours as needed for wheezing.    Marland Kitchen amLODipine (NORVASC) 5 MG tablet TAKE 1 TABLET (5 MG TOTAL) BY MOUTH DAILY. 30 tablet 5  . bimatoprost (LUMIGAN) 0.01 % SOLN Place 1 drop into both eyes at bedtime.    . brinzolamide (AZOPT) 1 % ophthalmic suspension Place 1 drop into both eyes 3 (three) times daily.    . Calcium Carbonate-Vitamin D (CALTRATE 600+D) 600-400 MG-UNIT per tablet Take 2 tablets by mouth daily.    . Coenzyme Q10 (COQ10) 50 MG CAPS Take one tablet by mouth once daily    . fluticasone (FLONASE) 50 MCG/ACT nasal spray Place 1 spray into the nose daily.    . lansoprazole (PREVACID) 3 mg/ml SUSP oral suspension Take 10 mLs (30 mg total) by mouth daily before supper. 120 mL 3  . loteprednol (LOTEMAX) 0.5 % ophthalmic suspension Place 1 drop into the left eye daily.    . Multiple Vitamins-Minerals (SENIOR MULTIVITAMIN PLUS) TABS Take by mouth.      . nitroGLYCERIN (NITROSTAT) 0.4 MG SL tablet Place 1 tablet (0.4 mg total) under the tongue every 5 (five) minutes as needed for chest pain. 60 tablet 2  . Omega-3 Fatty Acids (FISH OIL) 1200 MG CPDR Take one tablet by mouth once daily    . RESTASIS 0.05 % ophthalmic emulsion Place 1 drop into both eyes 2 (two) times daily.     . risperiDONE (RISPERDAL) 0.5 MG tablet TAKE 1 TABLET BY MOUTH TWICE A DAY 60 tablet 5  . SYMBICORT 80-4.5 MCG/ACT inhaler Place 2 puffs into alternate nostrils 2 (two) times daily.    Marland Kitchen TIVORBEX 20 MG CAPS Take 1 capsule by mouth daily.  0  . vitamin B-12 (CYANOCOBALAMIN) 1000 MCG tablet Take 1,000 mcg by mouth daily.    . vitamin C (ASCORBIC ACID) 500 MG tablet Take 500 mg by mouth daily.     Current Facility-Administered Medications  Medication Dose Route Frequency Provider Last Rate Last Dose  . hydrocortisone (ANUSOL-HC) 2.5 % rectal cream   Rectal BID Tiffany L Reed, DO        Allergies:   Aspirin; Codeine; Iodine; Prednisone; and Sulfa antibiotics    Social History:  The patient  reports that she has never smoked. She does not have any smokeless  tobacco history on file. She reports that she does not drink alcohol or use illicit drugs.   Family History:  The patient's family history includes Stroke in her father.    ROS:  Please see the history of present illness.   Otherwise, review of systems are positive for constipation and joint pain.   All other systems are reviewed and negative.    PHYSICAL EXAM: VS:  BP 120/68 mmHg  Pulse 62  Ht 5\' 2"  (1.575 m)  Wt 133 lb 12.8 oz (60.691 kg)  BMI 24.47 kg/m2  SpO2 97% , BMI Body mass index is 24.47 kg/(m^2). GEN: Well nourished, well developed, in no acute distress HEENT: normal Neck: no JVD, carotid bruits, or masses Cardiac: RRR; no murmurs, rubs, or gallops.  Trace  edema  Respiratory:  clear to auscultation bilaterally, normal work of breathing GI: soft, nontender, nondistended, + BS MS: no deformity  or atrophy Skin: warm and dry, no rash Neuro:  Strength and sensation are intact Psych: euthymic mood, full affect   EKG:  EKG is not ordered today.    Recent Labs: 06/09/2014: Pro B Natriuretic peptide (BNP) 127.5; TSH 2.510 06/10/2014: ALT 9 01/10/2015: BUN 20; Creatinine 1.01*; Hemoglobin 12.1; Platelets 256; Potassium 4.0; Sodium 144    Lipid Panel    Component Value Date/Time   CHOL 185 06/09/2014 2258   CHOL 187 08/17/2013 0836   TRIG 39 06/09/2014 2258   HDL 98 06/09/2014 2258   HDL 105 08/17/2013 0836   CHOLHDL 1.9 06/09/2014 2258   CHOLHDL 1.8 08/17/2013 0836   VLDL 8 06/09/2014 2258   LDLCALC 79 06/09/2014 2258   LDLCALC 73 08/17/2013 0836      Wt Readings from Last 3 Encounters:  02/17/15 133 lb 12.8 oz (60.691 kg)  01/10/15 139 lb (63.05 kg)  12/01/14 141 lb (63.957 kg)     ASSESSMENT AND PLAN:  1. LE edema - stable and based on sodium in her diet - she is no longer on a diuretic per her PCP 2. HTN - well controlled - continue amlodipine     Current medicines are reviewed at length with the patient today.  The patient does not have concerns regarding medicines.  The following changes have been made:  no change  Labs/ tests ordered today include: see above assessment and plan No orders of the defined types were placed in this encounter.     Disposition:   FU with me in 6 months   Signed, Sueanne Margarita, MD  02/17/2015 3:02 PM    [facility] [address], [place], [place]  [address] Phone: [phone]; Fax: [phone]

## 2015-02-17 ENCOUNTER — Encounter: Payer: Self-pay | Admitting: Cardiology

## 2015-02-17 ENCOUNTER — Ambulatory Visit (INDEPENDENT_AMBULATORY_CARE_PROVIDER_SITE_OTHER): Payer: Medicare Other | Admitting: Cardiology

## 2015-02-17 VITALS — BP 120/68 | HR 62 | Ht 62.0 in | Wt 133.8 lb

## 2015-02-17 DIAGNOSIS — I10 Essential (primary) hypertension: Secondary | ICD-10-CM

## 2015-02-17 DIAGNOSIS — R609 Edema, unspecified: Secondary | ICD-10-CM | POA: Diagnosis not present

## 2015-02-17 DIAGNOSIS — R600 Localized edema: Secondary | ICD-10-CM

## 2015-02-17 NOTE — Patient Instructions (Signed)

## 2015-02-20 ENCOUNTER — Ambulatory Visit: Payer: Medicare Other | Admitting: Internal Medicine

## 2015-03-20 ENCOUNTER — Ambulatory Visit (INDEPENDENT_AMBULATORY_CARE_PROVIDER_SITE_OTHER): Payer: Medicare Other | Admitting: Internal Medicine

## 2015-03-20 ENCOUNTER — Encounter: Payer: Self-pay | Admitting: Internal Medicine

## 2015-03-20 VITALS — BP 120/68 | HR 65 | Temp 97.9°F | Resp 20 | Ht 62.0 in | Wt 132.4 lb

## 2015-03-20 DIAGNOSIS — R1030 Lower abdominal pain, unspecified: Secondary | ICD-10-CM | POA: Diagnosis not present

## 2015-03-20 DIAGNOSIS — R609 Edema, unspecified: Secondary | ICD-10-CM | POA: Diagnosis not present

## 2015-03-20 DIAGNOSIS — I10 Essential (primary) hypertension: Secondary | ICD-10-CM | POA: Diagnosis not present

## 2015-03-20 DIAGNOSIS — K649 Unspecified hemorrhoids: Secondary | ICD-10-CM | POA: Diagnosis not present

## 2015-03-20 DIAGNOSIS — K219 Gastro-esophageal reflux disease without esophagitis: Secondary | ICD-10-CM | POA: Diagnosis not present

## 2015-03-20 DIAGNOSIS — R739 Hyperglycemia, unspecified: Secondary | ICD-10-CM | POA: Diagnosis not present

## 2015-03-20 DIAGNOSIS — M159 Polyosteoarthritis, unspecified: Secondary | ICD-10-CM

## 2015-03-20 DIAGNOSIS — R600 Localized edema: Secondary | ICD-10-CM

## 2015-03-20 MED ORDER — HYDROCORTISONE 2.5 % RE CREA: 1.0000 "application " | TOPICAL_CREAM | Freq: Two times a day (BID) | RECTAL | Status: DC

## 2015-03-20 MED ORDER — ACETAMINOPHEN 500 MG PO TABS: 500.0000 mg | ORAL_TABLET | Freq: Four times a day (QID) | ORAL | Status: DC | PRN

## 2015-03-20 NOTE — Progress Notes (Signed)
[name], female   DOB: 1924-05-28, 81 y.o.   MRN: [id number]   Location:  [facility]  Code Status: DNR Goals of Care: Advanced Directive information Does patient have an advance directive?: Yes, Type of Advance Directive: [place];Living will, Does patient want to make changes to advanced directive?: No - Patient declined   Chief Complaint  Patient presents with  . Medical Management of Chronic Issues    3 month follow-up    HPI: Patient is a 81 y.o. black female seen in the office today for med mgt of chronic diseases.    She says she needs more hydrocortisone cream for her hemorrhoids.  Feels pretty good.  Edema is a lot better.  Is not on lasix or any diuretics.  Continues to avoid sodium in her diet.  Blood pressure identical to last visit here and normal.  Sometimes woozy--not much and not real easily detectable.  No falls.  Using cane.    Sleeps well most nights.  Mood is better lately.  Appetite still not great, but is trying to eat anyway.    Is taking her prevacid in the evening.  If forgets before supper, still works ok an hour or so afterwards.  2 wks ago, she had a little bit of the pain she had when she had been to the ED with chest pain in February.  Was like little lightning pains.  Like a straight pin size shooting into her heart on the left side.  Does not radiate.  No sob.  No diaphoresis.      Is already drinking ensure.  C/o loss of appetite still.  Also c/o some bilateral lower abdominal pain that occurs before bms.  Says stools are sluggish.  Encouraged fiber and fluid intake for this.    Review of Systems:  Review of Systems  Constitutional: Positive for weight loss. Negative for fever, chills and diaphoresis.       Loss of appetite  Eyes: Negative for blurred vision.  Respiratory: Negative for shortness of breath.   Cardiovascular: Positive for chest pain and leg swelling.     See hpi; edema better--wears her compression hose  Gastrointestinal: Positive for heartburn and constipation. Negative for nausea, vomiting, abdominal pain, diarrhea, blood in stool and melena.       Hemorrhoids  Genitourinary: Negative for dysuria.  Musculoskeletal: Negative for falls.  Neurological: Negative for dizziness, loss of consciousness and headaches.  Psychiatric/Behavioral: Positive for hallucinations and memory loss. Negative for depression. The patient is not nervous/anxious.     Past Medical History  Diagnosis Date  . Benign essential hypertension   . Osteoarthritis   . GERD (gastroesophageal reflux disease)   . Seasonal allergies   . Asthma   . Glaucoma   . PVD (peripheral vascular disease)   . Anxiety     Past Surgical History  Procedure Laterality Date  . Abdominal hysterectomy  1981  . Tonsillectomy  1953    Allergies  Allergen Reactions  . Aspirin   . Codeine   . Iodine     Pt is not aware of this allergy, does not recall much info....//a.c.  . Prednisone   . Sulfa Antibiotics    Medications: Patient's Medications  New Prescriptions   ACETAMINOPHEN (TYLENOL) 500 MG TABLET    Take 1 tablet (500 mg total) by mouth every 6 (six) hours as needed for moderate pain.   HYDROCORTISONE (ANUSOL-HC) 2.5 % RECTAL  CREAM    Place 1 application rectally 2 (two) times daily.  Previous Medications   ALBUTEROL (PROVENTIL HFA;VENTOLIN HFA) 108 (90 BASE) MCG/ACT INHALER    Inhale 2 puffs into the lungs every 6 (six) hours as needed for wheezing.   AMLODIPINE (NORVASC) 5 MG TABLET    TAKE 1 TABLET (5 MG TOTAL) BY MOUTH DAILY.   BIMATOPROST (LUMIGAN) 0.01 % SOLN    Place 1 drop into both eyes at bedtime.   BRINZOLAMIDE (AZOPT) 1 % OPHTHALMIC SUSPENSION    Place 1 drop into both eyes 3 (three) times daily.   CALCIUM CARBONATE-VITAMIN D (CALTRATE 600+D) 600-400 MG-UNIT PER TABLET    Take 2 tablets by mouth daily.   COENZYME Q10 (COQ10) 50 MG CAPS    Take one tablet by  mouth once daily   FLUTICASONE (FLONASE) 50 MCG/ACT NASAL SPRAY    Place 1 spray into the nose daily.   LANSOPRAZOLE (PREVACID) 3 MG/ML SUSP ORAL SUSPENSION    Take 10 mLs (30 mg total) by mouth daily before supper.   LOTEPREDNOL (LOTEMAX) 0.5 % OPHTHALMIC SUSPENSION    Place 1 drop into the left eye daily.   MULTIPLE VITAMINS-MINERALS (SENIOR MULTIVITAMIN PLUS) TABS    Take by mouth.   NITROGLYCERIN (NITROSTAT) 0.4 MG SL TABLET    Place 1 tablet (0.4 mg total) under the tongue every 5 (five) minutes as needed for chest pain.   OMEGA-3 FATTY ACIDS (FISH OIL) 1200 MG CPDR    Take one tablet by mouth once daily   RESTASIS 0.05 % OPHTHALMIC EMULSION    Place 1 drop into both eyes 2 (two) times daily.    RISPERIDONE (RISPERDAL) 0.5 MG TABLET    TAKE 1 TABLET BY MOUTH TWICE A DAY   SYMBICORT 80-4.5 MCG/ACT INHALER    Place 2 puffs into alternate nostrils 2 (two) times daily.   VITAMIN B-12 (CYANOCOBALAMIN) 1000 MCG TABLET    Take 1,000 mcg by mouth daily.   VITAMIN C (ASCORBIC ACID) 500 MG TABLET    Take 500 mg by mouth daily.  Modified Medications   No medications on file  Discontinued Medications   TIVORBEX 20 MG CAPS    Take 1 capsule by mouth daily.    Physical Exam: Filed Vitals:   03/20/15 1416  BP: 120/68  Pulse: 65  Temp: 97.9 F (36.6 C)  TempSrc: Oral  Resp: 20  Height: 5\' 2"  (1.575 m)  Weight: 132 lb 6.4 oz (60.056 kg)  SpO2: 97%   Physical Exam  Constitutional: She is oriented to person, place, and time. She appears well-developed and well-nourished. No distress.  Cardiovascular: Normal rate, regular rhythm, normal heart sounds and intact distal pulses.   Pulmonary/Chest: Effort normal and breath sounds normal. She has no rales.  Abdominal: Soft. Bowel sounds are normal. She exhibits no distension and no mass. There is no tenderness.  Musculoskeletal: Normal range of motion.  Neurological: She is alert and oriented to person, place, and time.  Skin: Skin is warm and dry.    Psychiatric:  Very calm; speaks slowly    Labs reviewed: Basic Metabolic Panel:  Recent Labs  06/09/14 2357  11/17/14 1604 01/10/15 1059 03/20/15 1517  NA  --   < > 139 144 143  K  --   < > 3.9 4.0 4.2  CL  --   < > 104 105 105  CO2  --   < > 31 23 23   GLUCOSE  --   < >  109* 120* 103*  BUN  --   < > 15 20 20   CREATININE  --   < > 0.85 1.01* 0.82  CALCIUM  --   < > 9.3 9.6 9.3  TSH 2.510  --   --   --   --   < > = values in this interval not displayed. Liver Function Tests:  Recent Labs  06/10/14 0441 03/20/15 1517  AST 13 17  ALT 9 14  ALKPHOS 55 64  BILITOT 0.5 0.7  PROT 5.6* 6.4  ALBUMIN 2.8*  --    No results for input(s): LIPASE, AMYLASE in the last 8760 hours. No results for input(s): AMMONIA in the last 8760 hours. CBC:  Recent Labs  06/10/14 0441 11/17/14 1604 01/10/15 1059 03/20/15 1517  WBC 4.4 5.0 5.2 5.2  NEUTROABS 2.0  --  3.0 3.1  HGB 10.5* 11.8* 12.1  --   HCT 30.3* 34.3* 36.3 32.8*  MCV 88.3 89.1 91  --   PLT 189 228 256  --    Lipid Panel:  Recent Labs  06/09/14 2258  CHOL 185  HDL 98  LDLCALC 79  TRIG 39  CHOLHDL 1.9   Lab Results  Component Value Date   HGBA1C 5.7* 06/09/2014    Procedures since last visit: Saw cardiology and podiatry and notes reviewed  Assessment/Plan 1. Hemorrhoids, unspecified hemorrhoid type - stable, requests more cream for them, not c/o bleeding,only irritation - hydrocortisone (ANUSOL-HC) 2.5 % rectal cream; Place 1 application rectally 2 (two) times daily.  Dispense: 30 g; Refill: 3 - CBC with Differential/Platelet  2. Gastroesophageal reflux disease without esophagitis - cont her prevacid as she is still symptomatic--advised to cut back on foods that bring this on - CBC with Differential/Platelet - Comprehensive metabolic panel  3. Benign essential hypertension -bp at goal with current therapy, low sodium diet and compression hose - Comprehensive metabolic panel  4. Edema  extremities -much better with compression hose, elevation, low sodium diet, try to avoid diuretics due to her poor intake at times and confusion especially when she's responding to conservative measures  5. Osteoarthritis of multiple joints, unspecified osteoarthritis type - encouraged her to use tylenol for her joint pain; avoid nsaids which was worsen her kidney function and cause GI bleeding - acetaminophen (TYLENOL) 500 MG tablet; Take 1 tablet (500 mg total) by mouth every 6 (six) hours as needed for moderate pain.  Dispense: 30 tablet; Refill: 0  6. Lower abdominal pain -suspect this is when she is constipated--encouraged increased fiber and water intake - CBC with Differential/Platelet - Comprehensive metabolic panel  7. Hyperglycemia -cont to monitor hba1c  Labs/tests ordered:  Orders Placed This Encounter  Procedures  . CBC with Differential/Platelet  . Comprehensive metabolic panel   Next appt:  3 mos  Ezana Hubbert L. Sada Mazzoni, D.O. [facility] [address], [place] [address] Cell Phone (Mon-Fri 8am-5pm):  [phone] On Call:  [phone] & follow prompts after 5pm & weekends Office Phone:  [phone] Office Fax:  [phone]

## 2015-03-21 LAB — CBC WITH DIFFERENTIAL/PLATELET
Basophils Absolute: 0 10*3/uL (ref 0.0–0.2)
Basos: 0 %
EOS (ABSOLUTE): 0.1 10*3/uL (ref 0.0–0.4)
Eos: 2 %
Hematocrit: 32.8 % — ABNORMAL LOW (ref 34.0–46.6)
Hemoglobin: 11.3 g/dL (ref 11.1–15.9)
Immature Grans (Abs): 0 10*3/uL (ref 0.0–0.1)
Immature Granulocytes: 0 %
Lymphocytes Absolute: 1.4 10*3/uL (ref 0.7–3.1)
Lymphs: 26 %
MCH: 31.5 pg (ref 26.6–33.0)
MCHC: 34.5 g/dL (ref 31.5–35.7)
MCV: 91 fL (ref 79–97)
Monocytes Absolute: 0.6 10*3/uL (ref 0.1–0.9)
Monocytes: 12 %
Neutrophils Absolute: 3.1 10*3/uL (ref 1.4–7.0)
Neutrophils: 60 %
Platelets: 235 10*3/uL (ref 150–379)
RBC: 3.59 x10E6/uL — ABNORMAL LOW (ref 3.77–5.28)
RDW: 14.7 % (ref 12.3–15.4)
WBC: 5.2 10*3/uL (ref 3.4–10.8)

## 2015-03-21 LAB — COMPREHENSIVE METABOLIC PANEL
ALT: 14 IU/L (ref 0–32)
AST: 17 IU/L (ref 0–40)
Albumin/Globulin Ratio: 1.9 (ref 1.1–2.5)
Albumin: 4.2 g/dL (ref 3.5–4.7)
Alkaline Phosphatase: 64 IU/L (ref 39–117)
BUN/Creatinine Ratio: 24 (ref 11–26)
BUN: 20 mg/dL (ref 8–27)
Bilirubin Total: 0.7 mg/dL (ref 0.0–1.2)
CO2: 23 mmol/L (ref 18–29)
Calcium: 9.3 mg/dL (ref 8.7–10.3)
Chloride: 105 mmol/L (ref 97–108)
Creatinine, Ser: 0.82 mg/dL (ref 0.57–1.00)
GFR calc Af Amer: 74 mL/min/{1.73_m2} (ref >59)
GFR calc non Af Amer: 64 mL/min/{1.73_m2} (ref >59)
Globulin, Total: 2.2 g/dL (ref 1.5–4.5)
Glucose: 103 mg/dL — ABNORMAL HIGH (ref 65–99)
Potassium: 4.2 mmol/L (ref 3.5–5.2)
Sodium: 143 mmol/L (ref 134–144)
Total Protein: 6.4 g/dL (ref 6.0–8.5)

## 2015-03-24 ENCOUNTER — Ambulatory Visit: Payer: Medicare Other | Admitting: Podiatry

## 2015-03-28 DIAGNOSIS — H16223 Keratoconjunctivitis sicca, not specified as Sjogren's, bilateral: Secondary | ICD-10-CM | POA: Diagnosis not present

## 2015-03-28 DIAGNOSIS — H4011X3 Primary open-angle glaucoma, severe stage: Secondary | ICD-10-CM | POA: Diagnosis not present

## 2015-03-28 DIAGNOSIS — H02403 Unspecified ptosis of bilateral eyelids: Secondary | ICD-10-CM | POA: Diagnosis not present

## 2015-04-03 ENCOUNTER — Ambulatory Visit (INDEPENDENT_AMBULATORY_CARE_PROVIDER_SITE_OTHER): Payer: Medicare Other | Admitting: Podiatry

## 2015-04-03 ENCOUNTER — Encounter: Payer: Self-pay | Admitting: Podiatry

## 2015-04-03 DIAGNOSIS — Q828 Other specified congenital malformations of skin: Secondary | ICD-10-CM

## 2015-04-03 DIAGNOSIS — M79676 Pain in unspecified toe(s): Secondary | ICD-10-CM | POA: Diagnosis not present

## 2015-04-03 DIAGNOSIS — B351 Tinea unguium: Secondary | ICD-10-CM

## 2015-04-04 NOTE — Progress Notes (Signed)
[name], female   DOB: Jan 27, 1924, 81 y.o.   MRN: [id number]  Subjective: 81 y.o. female returns the office today for painful, elongated, thickened toenails which she is unable to trim herself and for painful calluses. Denies any redness or drainage around the nails/calluses. Denies any acute changes since last appointment and no new complaints today. Denies any systemic complaints such as fevers, chills, nausea, vomiting.   Objective: AAO 3, NAD DP/PT pulses palpable 1/4, CRT less than 3 seconds Protective sensation appears intact with Simms Weinstein monofilament Nails hypertrophic, dystrophic, elongated, brittle, discolored 10. There is tenderness overlying the nails 1-5 bilaterally. There is no surrounding erythema or drainage along the nail sites. Right foot hyperkeratotic lesions submetatarsal one and 5. Upon debridement there is no underlying ulceration, drainage or other clinical signs of infection.  No open lesions or pre-ulcerative lesions are identified. No other areas of tenderness bilateral lower extremities. No overlying edema, erythema, increased warmth. No pain with calf compression, swelling, warmth, erythema.  Assessment: Patient presents with symptomatic onychomycosis; hyperkeratotic lesions  Plan: -Treatment options including alternatives, risks, complications were discussed -Nails sharply debrided 10 without complication/bleeding. -Hyperkeratotic lesions sharply debrided 2 without complication/bleeding. -Discussed daily foot inspection. If there are any changes, to call the office immediately.  -Follow-up in 3 months or sooner if any problems are to arise. In the meantime, encouraged to call the office with any questions, concerns, changes symptoms.  Celesta Gentile, DPM

## 2015-04-05 DIAGNOSIS — M179 Osteoarthritis of knee, unspecified: Secondary | ICD-10-CM | POA: Diagnosis not present

## 2015-04-17 ENCOUNTER — Other Ambulatory Visit: Payer: Self-pay | Admitting: Internal Medicine

## 2015-05-10 DIAGNOSIS — M179 Osteoarthritis of knee, unspecified: Secondary | ICD-10-CM | POA: Diagnosis not present

## 2015-05-15 DIAGNOSIS — H5710 Ocular pain, unspecified eye: Secondary | ICD-10-CM | POA: Diagnosis not present

## 2015-06-22 ENCOUNTER — Ambulatory Visit: Payer: Medicare Other | Admitting: Internal Medicine

## 2015-06-23 DIAGNOSIS — M179 Osteoarthritis of knee, unspecified: Secondary | ICD-10-CM | POA: Diagnosis not present

## 2015-07-07 ENCOUNTER — Ambulatory Visit: Payer: Medicare Other | Admitting: Podiatry

## 2015-07-10 ENCOUNTER — Encounter: Payer: Self-pay | Admitting: Podiatry

## 2015-07-10 ENCOUNTER — Ambulatory Visit (INDEPENDENT_AMBULATORY_CARE_PROVIDER_SITE_OTHER): Payer: Medicare Other | Admitting: Podiatry

## 2015-07-10 DIAGNOSIS — M79676 Pain in unspecified toe(s): Secondary | ICD-10-CM | POA: Diagnosis not present

## 2015-07-10 DIAGNOSIS — Q828 Other specified congenital malformations of skin: Secondary | ICD-10-CM | POA: Diagnosis not present

## 2015-07-10 DIAGNOSIS — I739 Peripheral vascular disease, unspecified: Secondary | ICD-10-CM

## 2015-07-10 DIAGNOSIS — B351 Tinea unguium: Secondary | ICD-10-CM | POA: Diagnosis not present

## 2015-07-10 NOTE — Progress Notes (Signed)
[name], female   DOB: 1924/08/23, 81 y.o.   MRN: [id number]  Subjective: 81 y.o. female returns the office today for painful, elongated, thickened toenails which she is unable to trim herself and for painful calluses. Denies any redness or drainage around the nails/calluses. She also states that she gets pain in the back of her heels when she puts pressure on the mend bed. She has no pain in the day with walking or ambulation. No tingling or numbness to the area. Denies any ulceration. Denies any acute changes since last appointment and no new complaints today. Denies any systemic complaints such as fevers, chills, nausea, vomiting.   Objective: AAO 3, NAD DP/PT pulses palpable 1/4, CRT less than 3 seconds Protective sensation appears intact with Simms Weinstein monofilament Nails hypertrophic, dystrophic, elongated, brittle, discolored 10. There is tenderness overlying the nails 1-5 bilaterally. There is no surrounding erythema or drainage along the nail sites. Right foot hyperkeratotic lesions submetatarsal 1 and 5 and left lateral 4th digit. Upon debridement there is no underlying ulceration, drainage or other clinical signs of infection.  No open lesions or pre-ulcerative lesions are identified.  There is not appear to be any skin irritation to the posterior heels. There is no tenderness palpation of on the area. No pain with lateral compression of the calcaneus. No open edema, erythema, increased warmth. No other areas of tenderness bilateral lower extremities. No overlying edema, erythema, increased warmth. No pain with calf compression, swelling, warmth, erythema.  Assessment: Patient presents with symptomatic onychomycosis; hyperkeratotic lesions; pressure posterior heels  Plan: -Treatment options including alternatives, risks, complications were discussed -Nails sharply debrided 10 without complication/bleeding. -Hyperkeratotic lesions sharply debrided 2 without  complication/bleeding. -Recommended offloading with pillows while in bed to the heels take pressure off the areas. -Discussed daily foot inspection. If there are any changes, to call the office immediately.  -Follow-up in 3 months or sooner if any problems are to arise. In the meantime, encouraged to call the office with any questions, concerns, changes symptoms.  Celesta Gentile, DPM

## 2015-07-17 ENCOUNTER — Encounter: Payer: Self-pay | Admitting: Internal Medicine

## 2015-07-17 ENCOUNTER — Ambulatory Visit (INDEPENDENT_AMBULATORY_CARE_PROVIDER_SITE_OTHER): Payer: Medicare Other | Admitting: Internal Medicine

## 2015-07-17 VITALS — BP 140/80 | HR 65 | Temp 97.9°F | Resp 20 | Ht 62.0 in | Wt 135.2 lb

## 2015-07-17 DIAGNOSIS — R739 Hyperglycemia, unspecified: Secondary | ICD-10-CM

## 2015-07-17 DIAGNOSIS — R1110 Vomiting, unspecified: Secondary | ICD-10-CM

## 2015-07-17 DIAGNOSIS — I10 Essential (primary) hypertension: Secondary | ICD-10-CM

## 2015-07-17 DIAGNOSIS — Z23 Encounter for immunization: Secondary | ICD-10-CM | POA: Diagnosis not present

## 2015-07-17 DIAGNOSIS — I872 Venous insufficiency (chronic) (peripheral): Secondary | ICD-10-CM | POA: Diagnosis not present

## 2015-07-17 DIAGNOSIS — Z780 Asymptomatic menopausal state: Secondary | ICD-10-CM

## 2015-07-17 DIAGNOSIS — M159 Polyosteoarthritis, unspecified: Secondary | ICD-10-CM | POA: Diagnosis not present

## 2015-07-17 DIAGNOSIS — J4520 Mild intermittent asthma, uncomplicated: Secondary | ICD-10-CM | POA: Diagnosis not present

## 2015-07-17 NOTE — Progress Notes (Signed)
[name], female   DOB: 1923-11-10, 81 y.o.   MRN: [id number]   Location: [place] Provider: Rexene Edison. Mariea Clonts, D.O., C.M.D.  Code Status: DNR Goals of Care: Advanced Directive information Does patient have an advance directive?: Yes, Type of Advance Directive: [place], Does patient want to make changes to advanced directive?: No - Patient declined  Chief Complaint  Patient presents with  . Medical Management of Chronic Issues    Medical Management of Chronic Issues    HPI: Patient is a 81 y.o. female seen in the office today for med mgt of her chronic diseases.  Her weight is up 3.2 lbs.  BP elevated today.  Sats normal.  Needs flu shot.  She is drinking more ensure.  Legs are not swollen--wearing compression hose.    When eats, she hiccups and food wants to come up.  Happens each time she eats.  Consistency of food does not matter.  Does not happen with pills.  No pain.  Says it happens after she gets the item swallowed.  Will come up to her throat and she'll reswallow it.  No vomiting.  Denies difficulty with food going down--says she does swallow hard.  No globus sensation.  No odynophagia.  Going on a few months but not sure exactly how long.  Says it happens too quickly for it to be indigestion.  Does sometimes happen after she's been done eating for a few mins, but not for hours.  Discussed that she should take her prevacid 30 mins before, but she read that it could be taken 2 hours later and that helped.    Says symptoms she has now are different than her reflux symptoms.  Diet hasn't really changed.    Asthma:  Doing well with symbicort.  Has not had to use her albuterol.    Agrees to her flu shot.   Is bothered by callouses on her feet.  Went last week to podiatrist.    Lipid screening for the year is due but not covered under screening. Has had normal lipids.  Review of Systems:  Review of Systems  Constitutional: Negative for  fever, chills and weight loss.  HENT: Negative for congestion and hearing loss.   Eyes: Negative for blurred vision.       Glasses  Respiratory: Negative for shortness of breath and wheezing.   Cardiovascular: Negative for chest pain.  Gastrointestinal: Negative for heartburn, nausea, vomiting, abdominal pain, diarrhea, constipation, blood in stool and melena.       Regurgitation of food  Musculoskeletal: Negative for falls.  Skin: Negative for rash.  Neurological: Negative for dizziness.  Psychiatric/Behavioral: Positive for memory loss. Negative for depression.    Past Medical History  Diagnosis Date  . Benign essential hypertension   . Osteoarthritis   . GERD (gastroesophageal reflux disease)   . Seasonal allergies   . Asthma   . Glaucoma   . PVD (peripheral vascular disease) ([place])   . Anxiety     Past Surgical History  Procedure Laterality Date  . Abdominal hysterectomy  1981  . Tonsillectomy  1953    Allergies  Allergen Reactions  . Aspirin   . Codeine   . Iodine     Pt is not aware of this allergy, does not recall much info....//a.c.  . Prednisone   . Sulfa Antibiotics       Medication List       This list is accurate as of:  07/17/15  1:35 PM.  Always use your most recent med list.               acetaminophen 500 MG tablet  Commonly known as:  TYLENOL  Take 1 tablet (500 mg total) by mouth every 6 (six) hours as needed for moderate pain.     albuterol 108 (90 BASE) MCG/ACT inhaler  Commonly known as:  PROVENTIL HFA;VENTOLIN HFA  Inhale 2 puffs into the lungs every 6 (six) hours as needed for wheezing.     amLODipine 5 MG tablet  Commonly known as:  NORVASC  TAKE 1 TABLET (5 MG TOTAL) BY MOUTH DAILY.     bimatoprost 0.01 % Soln  Commonly known as:  LUMIGAN  Place 1 drop into both eyes at bedtime.     brinzolamide 1 % ophthalmic suspension  Commonly known as:  AZOPT  Place 1 drop into both eyes 3 (three) times daily.     CALTRATE 600+D  600-400 MG-UNIT tablet  Generic drug:  Calcium Carbonate-Vitamin D  Take 2 tablets by mouth daily.     CoQ10 50 MG Caps  Take one tablet by mouth once daily     Fish Oil 1200 MG Cpdr  Take one tablet by mouth once daily     fluticasone 50 MCG/ACT nasal spray  Commonly known as:  FLONASE  Place 1 spray into the nose daily.     hydrocortisone 2.5 % rectal cream  Commonly known as:  ANUSOL-HC  Place 1 application rectally 2 (two) times daily.     lansoprazole 3 mg/ml Susp oral suspension  Commonly known as:  PREVACID  Take 10 mLs (30 mg total) by mouth daily before supper.     loteprednol 0.5 % ophthalmic suspension  Commonly known as:  LOTEMAX  Place 1 drop into the left eye daily.     nitroGLYCERIN 0.4 MG SL tablet  Commonly known as:  NITROSTAT  Place 1 tablet (0.4 mg total) under the tongue every 5 (five) minutes as needed for chest pain.     RESTASIS 0.05 % ophthalmic emulsion  Generic drug:  cycloSPORINE  Place 1 drop into both eyes 2 (two) times daily.     risperiDONE 0.5 MG tablet  Commonly known as:  RISPERDAL  TAKE 1 TABLET BY MOUTH TWICE A DAY     SENIOR MULTIVITAMIN PLUS Tabs  Take by mouth.     SYMBICORT 80-4.5 MCG/ACT inhaler  Generic drug:  budesonide-formoterol  Place 2 puffs into alternate nostrils 2 (two) times daily.     vitamin B-12 1000 MCG tablet  Commonly known as:  CYANOCOBALAMIN  Take 1,000 mcg by mouth daily.     vitamin C 500 MG tablet  Commonly known as:  ASCORBIC ACID  Take 500 mg by mouth daily.        Health Maintenance  Topic Date Due  . INFLUENZA VACCINE  05/08/2015  . MAMMOGRAM  10/07/2018 (Originally 09/28/2009)  . ZOSTAVAX  10/07/2018 (Originally 10/04/1986)  . TETANUS/TDAP  10/07/2018 (Originally 10/04/1945)  . DEXA SCAN  Completed  . PNA vac Low Risk Adult  Completed    Physical Exam: Filed Vitals:   07/17/15 1315  BP: 140/80  Pulse: 65  Temp: 97.9 F (36.6 C)  TempSrc: Oral  Resp: 20  Height: 5\' 2"  (1.575  m)  Weight: 135 lb 3.2 oz (61.326 kg)  SpO2: 98%   Body mass index is 24.72 kg/(m^2). Physical Exam  Constitutional: She is oriented to person, place, and  time. She appears well-developed and well-nourished. No distress.  Cardiovascular: Normal rate, regular rhythm, normal heart sounds and intact distal pulses.   Pulmonary/Chest: Effort normal and breath sounds normal. No respiratory distress.  Abdominal: Soft. Bowel sounds are normal. She exhibits no distension. There is no tenderness.  Musculoskeletal: Normal range of motion.  Uses cane to ambulate  Neurological: She is alert and oriented to person, place, and time.  Skin: Skin is warm and dry.    Labs reviewed: Basic Metabolic Panel:  Recent Labs  11/17/14 1604 01/10/15 1059 03/20/15 1517  NA 139 144 143  K 3.9 4.0 4.2  CL 104 105 105  CO2 31 23 23   GLUCOSE 109* 120* 103*  BUN 15 20 20   CREATININE 0.85 1.01* 0.82  CALCIUM 9.3 9.6 9.3   Liver Function Tests:  Recent Labs  03/20/15 1517  AST 17  ALT 14  ALKPHOS 64  BILITOT 0.7  PROT 6.4  ALBUMIN 4.2   No results for input(s): LIPASE, AMYLASE in the last 8760 hours. No results for input(s): AMMONIA in the last 8760 hours. CBC:  Recent Labs  11/17/14 1604 01/10/15 1059 03/20/15 1517  WBC 5.0 5.2 5.2  NEUTROABS  --  3.0 3.1  HGB 11.8* 12.1  --   HCT 34.3* 36.3 32.8*  MCV 89.1 91  --   PLT 228 256  --    Lipid Panel: No results for input(s): CHOL, HDL, LDLCALC, TRIG, CHOLHDL, LDLDIRECT in the last 8760 hours. Lab Results  Component Value Date   HGBA1C 5.7* 06/09/2014   Assessment/Plan 1. Regurgitation of food - new problem in past few months - has had gerd on evening ppi with benefits on those symptoms  - ? Esophageal dysmotility or nutcracker esophagus - SLP modified barium swallow; Future  2. Benign essential hypertension -bp is at goal with current therapy for her age and known medical conditions (goal <150/90)  3. Chronic venous  insufficiency -cont to elevate legs at rest, wear compression hose and avoid high sodium foods and adding salt  4. Osteoarthritis of multiple joints, unspecified osteoarthritis type -joint pains well controlled at present without complaints  5. Asthma, mild intermittent, uncomplicated -cont symbicort and prn albuterol (has not needed)  6. Postmenopausal estrogen deficiency - last was in 2004 so really needs to be reassessed, continues on ca with D - DG Bone Density; Future  7. Hyperglycemia - sugar was elevated on last three bmps--suspect none were fasting, however, as pt usually comes in the afternoon - Hemoglobin A1c  8. Need for prophylactic vaccination and inoculation against influenza -flu shot given   Labs/tests ordered:   Orders Placed This Encounter  Procedures  . DG Bone Density    Standing Status: Future     Number of Occurrences:      Standing Expiration Date: 09/15/2016    Order Specific Question:  Reason for Exam (SYMPTOM  OR DIAGNOSIS REQUIRED)    Answer:  postmenopausal estrogen deficiency    Order Specific Question:  Preferred imaging location?    Answer:  [facility]  . Hemoglobin A1c  . SLP modified barium swallow    Pt with some regurgitation of food while eating, h/o GERD on PPI, no globus, no odynophagia, no specific consistency is problem, pills go down ok    Standing Status: Future     Number of Occurrences:      Standing Expiration Date: 07/16/2016    Order Specific Question:  Where should this test be performed:  Answer:  [facility]    Order Specific Question:  Please indicate reason for Referral:    Answer:  Concerned about Dysphagia/Aspiration    Order Specific Question:  Patients current diet consistency:    Answer:  Regular    Order Specific Question:  Patients current liquid consistency:    Answer:  Thin (All Liquid Allowed)    Order Specific Question:  Existing signs/symptoms of possible Aspiration/Dysphagia:    Answer:  Wet vocal  quality    Order Specific Question:  Other risk factors for Dysphagia:    Answer:  Unintentional weight loss    Next appt:  1 month re: swallowing  Angel Hobdy L. Natavia Sublette, D.O. [facility] [address], [place] [address] Cell Phone (Mon-Fri 8am-5pm):  [phone] On Call:  [phone] & follow prompts after 5pm & weekends Office Phone:  [phone] Office Fax:  [phone]

## 2015-07-18 LAB — HEMOGLOBIN A1C
Est. average glucose Bld gHb Est-mCnc: 111 mg/dL
Hgb A1c MFr Bld: 5.5 % (ref 4.8–5.6)

## 2015-07-19 ENCOUNTER — Other Ambulatory Visit (HOSPITAL_COMMUNITY): Payer: Self-pay | Admitting: Internal Medicine

## 2015-07-19 DIAGNOSIS — R1310 Dysphagia, unspecified: Secondary | ICD-10-CM

## 2015-07-20 ENCOUNTER — Other Ambulatory Visit (HOSPITAL_COMMUNITY): Payer: Self-pay | Admitting: Internal Medicine

## 2015-07-20 DIAGNOSIS — R1310 Dysphagia, unspecified: Secondary | ICD-10-CM

## 2015-07-24 DIAGNOSIS — M722 Plantar fascial fibromatosis: Secondary | ICD-10-CM | POA: Diagnosis not present

## 2015-07-26 ENCOUNTER — Ambulatory Visit (HOSPITAL_COMMUNITY)
Admission: RE | Admit: 2015-07-26 | Discharge: 2015-07-26 | Disposition: A | Discharge status: Home / Self Care | Payer: Medicare Other | Source: Ambulatory Visit | Attending: Internal Medicine | Admitting: Internal Medicine

## 2015-07-26 DIAGNOSIS — K228 Other specified diseases of esophagus: Secondary | ICD-10-CM | POA: Diagnosis not present

## 2015-07-26 DIAGNOSIS — K219 Gastro-esophageal reflux disease without esophagitis: Secondary | ICD-10-CM | POA: Insufficient documentation

## 2015-07-26 DIAGNOSIS — R933 Abnormal findings on diagnostic imaging of other parts of digestive tract: Secondary | ICD-10-CM | POA: Diagnosis not present

## 2015-07-26 DIAGNOSIS — R1110 Vomiting, unspecified: Secondary | ICD-10-CM | POA: Insufficient documentation

## 2015-07-26 DIAGNOSIS — K224 Dyskinesia of esophagus: Secondary | ICD-10-CM | POA: Insufficient documentation

## 2015-07-26 DIAGNOSIS — R1310 Dysphagia, unspecified: Secondary | ICD-10-CM | POA: Diagnosis not present

## 2015-07-26 NOTE — Procedures (Signed)
Objective Swallowing Evaluation: Other (Comment)  Patient Details  Name: JAMESYN MOOREFIELD MRN: [id number] Date of Birth: 11-12-1923  Today's Date: 07/26/2015 Time: SLP Start Time (ACUTE ONLY): 1310-SLP Stop Time (ACUTE ONLY): 1340 SLP Time Calculation (min) (ACUTE ONLY): 30 min  Past Medical History:  Past Medical History  Diagnosis Date  . Benign essential hypertension   . Osteoarthritis   . GERD (gastroesophageal reflux disease)   . Seasonal allergies   . Asthma   . Glaucoma   . PVD (peripheral vascular disease) ([place])   . Anxiety    Past Surgical History:  Past Surgical History  Procedure Laterality Date  . Abdominal hysterectomy  1981  . Tonsillectomy  1953   HPI:  Other Pertinent Information: 81 yo female referred by Dr Mariea Clonts for [facility].  Pt PMH + for reflux, pna 20 years ago, minimal weight loss, osteoarthritis, PVD, anxiety, allergies, HTN, asthma.  Pt reported to MD that when eats, she hiccups and food wants to come up.  She denies this occuring with liquids alone.  Food comes up to into her throat and she'll reswallow it. This occurs at any time during the meal.   She denied globus sensation and odynophagia to MD. Dysphagia has been occuing for a few months. She reported symptoms she has now are different than her reflux symptoms.   Pt denies "choking" on food nor requiring heimlich manuever.    No Data Recorded  Assessment / Plan / Recommendation CHL IP CLINICAL IMPRESSIONS 07/26/2015  Therapy Diagnosis Suspected primary esophageal dysphagia  Clinical Impression  Suspect primary esophageal dysphagia with minimal pharyngeal component.  No aspiration/penetration of any consistency tested *barium tablet, pudding, cracker, nectar, thin.  Minimal delay in pharyngeal swallow noted with liquids spilling to valleucular space and pyriform sinus prior to swallow trigger. Swallow was overall strong without residuals.    Appearance of prominent cricopharyngeus with trace amount of  liquid remained above CP x1 without pt sensation.  Per radiologist, ? If pt developing early Zenker's diverticulum.    Appearance of decreased clearance of esophagus distally with retrograde propulsion WITHOUT sensation.  Unfortunately, pt was asymptomatic throughout entire testing, therefore recommend strict esophageal precautions.    SLP used live video to educate pt to findings/recommendations to mitigate suspected primary esophageal deficits.  Advised she eat several small meals/day - consuming liquids throughout meals.  Liquids alone may be easier for pt to clear per her symptom report (? If Ensure consumption would ease symptoms and provide nutrition).  Also advised pt to start meals with liquids due to her known h/o xerostomia.    Recommend consider dedicated esophagram to differentiate pt's dysphagia.       CHL IP TREATMENT RECOMMENDATION 07/26/2015  Treatment Recommendations No treatment recommended at this time     CHL IP DIET RECOMMENDATION 07/26/2015  SLP Diet Recommendations Thin;Dysphagia 3 (Mech soft)  Liquid Administration via Cup, straw   Medication Administration Whole meds with liquid  Compensations Slow rate;Small sips/bites;Follow solids with liquid  Postural Changes and/or Swallow Maneuvers Stay upright,      CHL IP OTHER RECOMMENDATIONS 07/26/2015  Recommended Consults Consider GI evaluation;Consider esophageal assessment  Oral Care Recommendations Oral care BID  Other Recommendations Clarify dietary restrictions         CHL IP REASON FOR REFERRAL 07/26/2015  Reason for Referral Objectively evaluate swallowing function     CHL IP ORAL PHASE 07/26/2015  Oral Phase WFL      CHL IP PHARYNGEAL PHASE 07/26/2015  Pharyngeal Phase [facility]  CHL IP CERVICAL ESOPHAGEAL PHASE 07/26/2015  Cervical Esophageal Phase Impaired  Cervical Esophageal Comment appearance of prominent cricopharyngeus that affected clearance minimally *trace amount of liquid remained above CP  x1 without pt awareness, appearance of decreased clearance of esophagus distally with retrograde propulsion WITHOUT sensation    CHL IP GO 07/26/2015  Functional Assessment Tool Used MBS, clinical judgement  Functional Limitations Swallowing  Swallow Current Status (B9038) CJ  Swallow Goal Status (B3383) [place] Discharge Status [phone]) Michaell Cowing, [place] [facility] SLP [phone]

## 2015-07-31 ENCOUNTER — Other Ambulatory Visit: Payer: Self-pay | Admitting: Internal Medicine

## 2015-08-10 DIAGNOSIS — H401133 Primary open-angle glaucoma, bilateral, severe stage: Secondary | ICD-10-CM | POA: Diagnosis not present

## 2015-08-11 DIAGNOSIS — J3089 Other allergic rhinitis: Secondary | ICD-10-CM | POA: Diagnosis not present

## 2015-08-11 DIAGNOSIS — J4540 Moderate persistent asthma, uncomplicated: Secondary | ICD-10-CM | POA: Diagnosis not present

## 2015-08-15 ENCOUNTER — Ambulatory Visit (HOSPITAL_COMMUNITY): Payer: Medicare Other

## 2015-08-24 ENCOUNTER — Encounter: Payer: Self-pay | Admitting: Internal Medicine

## 2015-08-24 ENCOUNTER — Ambulatory Visit (INDEPENDENT_AMBULATORY_CARE_PROVIDER_SITE_OTHER): Payer: Medicare Other | Admitting: Internal Medicine

## 2015-08-24 VITALS — BP 142/68 | HR 65 | Temp 98.1°F | Resp 20 | Ht 62.0 in | Wt 138.8 lb

## 2015-08-24 DIAGNOSIS — K589 Irritable bowel syndrome without diarrhea: Secondary | ICD-10-CM | POA: Diagnosis not present

## 2015-08-24 DIAGNOSIS — R1110 Vomiting, unspecified: Secondary | ICD-10-CM

## 2015-08-24 DIAGNOSIS — M25551 Pain in right hip: Secondary | ICD-10-CM | POA: Diagnosis not present

## 2015-08-24 DIAGNOSIS — K224 Dyskinesia of esophagus: Secondary | ICD-10-CM

## 2015-08-24 MED ORDER — ALUM HYDROXIDE-MAG TRISILICATE 80-20 MG PO CHEW: 1.0000 | CHEWABLE_TABLET | Freq: Three times a day (TID) | ORAL | Status: DC | PRN

## 2015-08-24 NOTE — Progress Notes (Signed)
[name], female   DOB: [birth year], 81 y.o.   MRN: [id number]   Location: [place] Provider: Rexene Edison. Mariea Clonts, D.O., C.M.D.  Code Status: DNR Goals of Care: Advanced Directive information Does patient have an advance directive?: Yes, Type of Advance Directive: [place], Does patient want to make changes to advanced directive?: No - Patient declined  Chief Complaint  Patient presents with  . Medical Management of Chronic Issues    f/u for swallowing issues, rt hip pain x 1/2 day    HPI: Patient is a 81 y.o. female seen in the office today for f/u on her swallowing, but she is complaining of right hip pain since this am.  Started when she left home today.  Got worse by the time she arrived.  Denies any injury.  Also had felt it slightly last week.  She took a tylenol and didn't feel it anymore.  Notes she was out three days in a row and worked around the house another day and went out again yesterday.    Still having regurgitation of food.  Also has spasms on colon.  Has an annoying pain, not a hurting pain.  No associated diarrhea or sudden bms.  Just the sensation is there.  Has not changed diet.    Has bone density scheduled for 11/28.    Review of Systems:  Review of Systems  Constitutional: Negative for fever and chills.  Respiratory: Negative for shortness of breath.   Cardiovascular: Negative for chest pain.  Gastrointestinal: Positive for heartburn. Negative for nausea, vomiting, diarrhea and constipation.       Regurgitation of food sometimes  Genitourinary: Negative for dysuria.  Musculoskeletal: Positive for joint pain. Negative for myalgias, back pain and falls.  Psychiatric/Behavioral: Positive for hallucinations and memory loss.    Past Medical History  Diagnosis Date  . Benign essential hypertension   . Osteoarthritis   . GERD (gastroesophageal reflux disease)   . Seasonal allergies   . Asthma   . Glaucoma   . PVD  (peripheral vascular disease) ([place])   . Anxiety     Past Surgical History  Procedure Laterality Date  . Abdominal hysterectomy  1981  . Tonsillectomy  1953    Allergies  Allergen Reactions  . Aspirin   . Codeine   . Iodine     Pt is not aware of this allergy, does not recall much info....//a.c.  . Prednisone   . Sulfa Antibiotics       Medication List       This list is accurate as of: 08/24/15  2:41 PM.  Always use your most recent med list.               acetaminophen 500 MG tablet  Commonly known as:  TYLENOL  Take 1 tablet (500 mg total) by mouth every 6 (six) hours as needed for moderate pain.     albuterol 108 (90 BASE) MCG/ACT inhaler  Commonly known as:  PROVENTIL HFA;VENTOLIN HFA  Inhale 2 puffs into the lungs every 6 (six) hours as needed for wheezing.     amLODipine 5 MG tablet  Commonly known as:  NORVASC  TAKE 1 TABLET (5 MG TOTAL) BY MOUTH DAILY.     bimatoprost 0.01 % Soln  Commonly known as:  LUMIGAN  Place 1 drop into both eyes at bedtime.     brinzolamide 1 % ophthalmic suspension  Commonly known as:  AZOPT  Place 1 drop  into both eyes 3 (three) times daily.     CALTRATE 600+D 600-400 MG-UNIT tablet  Generic drug:  Calcium Carbonate-Vitamin D  Take 2 tablets by mouth daily.     CoQ10 50 MG Caps  Take one tablet by mouth once daily     Fish Oil 1200 MG Cpdr  Take one tablet by mouth once daily     fluticasone 50 MCG/ACT nasal spray  Commonly known as:  FLONASE  Place 1 spray into the nose daily.     hydrocortisone 2.5 % rectal cream  Commonly known as:  ANUSOL-HC  Place 1 application rectally 2 (two) times daily.     lansoprazole 3 mg/ml Susp oral suspension  Commonly known as:  PREVACID  Take 10 mLs (30 mg total) by mouth daily before supper.     loteprednol 0.5 % ophthalmic suspension  Commonly known as:  LOTEMAX  Place 1 drop into the left eye daily.     nitroGLYCERIN 0.4 MG SL tablet  Commonly known as:  NITROSTAT    Place 1 tablet (0.4 mg total) under the tongue every 5 (five) minutes as needed for chest pain.     RESTASIS 0.05 % ophthalmic emulsion  Generic drug:  cycloSPORINE  Place 1 drop into both eyes 2 (two) times daily.     risperiDONE 0.5 MG tablet  Commonly known as:  RISPERDAL  TAKE 1 TABLET BY MOUTH TWICE A DAY     SENIOR MULTIVITAMIN PLUS Tabs  Take by mouth.     SYMBICORT 80-4.5 MCG/ACT inhaler  Generic drug:  budesonide-formoterol  Place 2 puffs into alternate nostrils 2 (two) times daily.     vitamin B-12 1000 MCG tablet  Commonly known as:  CYANOCOBALAMIN  Take 1,000 mcg by mouth daily.     vitamin C 500 MG tablet  Commonly known as:  ASCORBIC ACID  Take 500 mg by mouth daily.        Health Maintenance  Topic Date Due  . MAMMOGRAM  10/07/2018 (Originally 09/28/2009)  . ZOSTAVAX  10/07/2018 (Originally 10/04/1986)  . TETANUS/TDAP  10/07/2018 (Originally 10/04/1945)  . INFLUENZA VACCINE  05/07/2016  . DEXA SCAN  Completed  . PNA vac Low Risk Adult  Completed    Physical Exam: Filed Vitals:   08/24/15 1422  BP: 142/68  Pulse: 65  Temp: 98.1 F (36.7 C)  TempSrc: Oral  Resp: 20  Height: 5\' 2"  (1.575 m)  Weight: 138 lb 12.8 oz (62.959 kg)  SpO2: 97%   Body mass index is 25.38 kg/(m^2). Physical Exam  Constitutional: She appears well-developed and well-nourished. No distress.  Cardiovascular: Normal rate, regular rhythm, normal heart sounds and intact distal pulses.   Pulmonary/Chest: Effort normal and breath sounds normal. No respiratory distress.  Abdominal: Soft. Bowel sounds are normal. She exhibits no distension. There is no tenderness.  Musculoskeletal: Normal range of motion. She exhibits tenderness.  Right hip over bursa and into right groin; more painful with walking and lying flat; not worsened with ROM of hip or straight leg raise; no pain in back  Neurological: She is alert.  Skin: Skin is warm and dry.  Psychiatric: She has a normal mood and  affect.    Labs reviewed: Basic Metabolic Panel:  Recent Labs  11/17/14 1604 01/10/15 1059 03/20/15 1517  NA 139 144 143  K 3.9 4.0 4.2  CL 104 105 105  CO2 31 23 23   GLUCOSE 109* 120* 103*  BUN 15 20 20   CREATININE 0.85 1.01*  0.82  CALCIUM 9.3 9.6 9.3   Liver Function Tests:  Recent Labs  03/20/15 1517  AST 17  ALT 14  ALKPHOS 64  BILITOT 0.7  PROT 6.4  ALBUMIN 4.2   No results for input(s): LIPASE, AMYLASE in the last 8760 hours. No results for input(s): AMMONIA in the last 8760 hours. CBC:  Recent Labs  11/17/14 1604 01/10/15 1059 03/20/15 1517  WBC 5.0 5.2 5.2  NEUTROABS  --  3.0 3.1  HGB 11.8* 12.1  --   HCT 34.3* 36.3 32.8*  MCV 89.1 91  --   PLT 228 256  --    Lipid Panel: No results for input(s): CHOL, HDL, LDLCALC, TRIG, CHOLHDL, LDLDIRECT in the last 8760 hours. Lab Results  Component Value Date   HGBA1C 5.5 07/17/2015    Procedures since last visit: Barium swallow with ST reviewed and esophageal workup recommended.  Assessment/Plan 1. Regurgitation of food - ongoing -has some esophageal dysmotility noted with barium tablet on barium swallow -will workup further with barium esophagram - DG Esophagus W/Water Sol CM; Future  2. Esophageal dysmotility - as in #1, need more information - DG Esophagus W/Water Sol CM; Future  3.  Acute right hip pain -advised to use up to 6 tylenol tabs per day -recommended ice to the right hip due to new onset symptoms -suspect flare of her arthritis after increased activity vs. Bursitis  Labs/tests ordered:   Orders Placed This Encounter  Procedures  . DG Esophagus W/Water Sol CM    Standing Status: Future     Number of Occurrences:      Standing Expiration Date: 10/23/2016    Order Specific Question:  Reason for Exam (SYMPTOM  OR DIAGNOSIS REQUIRED)    Answer:  esophageal dysmotility and stasis on barium swallow and this was recommended for further assessment; regurgitates food    Order Specific  Question:  Preferred imaging location?    Answer:  [facility]    Next appt:  2 months    Braxdon Gappa L. Alera Quevedo, D.O. [facility] [address], [place] [address] Cell Phone (Mon-Fri 8am-5pm):  [phone] On Call:  [phone] & follow prompts after 5pm & weekends Office Phone:  [phone] Office Fax:  [phone]

## 2015-09-04 ENCOUNTER — Ambulatory Visit
Admission: RE | Admit: 2015-09-04 | Discharge: 2015-09-04 | Disposition: A | Discharge status: Home / Self Care | Payer: Medicare Other | Source: Ambulatory Visit | Attending: Internal Medicine | Admitting: Internal Medicine

## 2015-09-04 DIAGNOSIS — M810 Age-related osteoporosis without current pathological fracture: Secondary | ICD-10-CM | POA: Diagnosis not present

## 2015-09-04 DIAGNOSIS — Z780 Asymptomatic menopausal state: Secondary | ICD-10-CM

## 2015-09-12 ENCOUNTER — Telehealth: Payer: Self-pay | Admitting: *Deleted

## 2015-09-12 NOTE — Telephone Encounter (Signed)
Unfortunately, there really isn't a way to test it ahead of time.  I would not recommend the infusion for her if she had such a bad reaction to the bisphosphonates.  The prolia works differently so she would be more likely to tolerate it.

## 2015-09-12 NOTE — Telephone Encounter (Signed)
Patient daughter, Arley Phenix called and stated that you were going to call her after you received the Bone Density regarding treatment for her mother. Daughter would like to speak with you, wants you to call [phone]

## 2015-09-12 NOTE — Telephone Encounter (Signed)
Called patient daughter, [name] [name] and she stated that she had concerns with patient taking the infusion or injection. Stated that patient had a bad reaction to taking Actonel in the past. Wants to know if there is a way to give her a smaller dose or test it before giving it to her?  Both daughter and patient has had a bad reaction to Osteoporosis medications in the past. Please Advise.

## 2015-09-12 NOTE — Telephone Encounter (Signed)
Pt's results are on the bottom of the bone density report. Chrae was trying to reach her daughter, but could not get a hold of her after the results returned 11/28.

## 2015-09-21 ENCOUNTER — Ambulatory Visit
Admission: RE | Admit: 2015-09-21 | Discharge: 2015-09-21 | Disposition: A | Discharge status: Home / Self Care | Payer: Medicare Other | Source: Ambulatory Visit | Attending: Internal Medicine | Admitting: Internal Medicine

## 2015-09-21 DIAGNOSIS — R1110 Vomiting, unspecified: Secondary | ICD-10-CM

## 2015-09-21 DIAGNOSIS — K224 Dyskinesia of esophagus: Secondary | ICD-10-CM

## 2015-09-22 ENCOUNTER — Other Ambulatory Visit: Payer: Self-pay

## 2015-09-22 DIAGNOSIS — K229 Disease of esophagus, unspecified: Secondary | ICD-10-CM

## 2015-09-29 ENCOUNTER — Other Ambulatory Visit: Payer: Self-pay | Admitting: Internal Medicine

## 2015-10-05 ENCOUNTER — Encounter: Payer: Self-pay | Admitting: Physician Assistant

## 2015-10-10 ENCOUNTER — Ambulatory Visit (INDEPENDENT_AMBULATORY_CARE_PROVIDER_SITE_OTHER): Payer: Medicare Other | Admitting: Podiatry

## 2015-10-10 ENCOUNTER — Encounter: Payer: Self-pay | Admitting: Podiatry

## 2015-10-10 DIAGNOSIS — B351 Tinea unguium: Secondary | ICD-10-CM | POA: Diagnosis not present

## 2015-10-10 DIAGNOSIS — Q828 Other specified congenital malformations of skin: Secondary | ICD-10-CM

## 2015-10-10 DIAGNOSIS — M79676 Pain in unspecified toe(s): Secondary | ICD-10-CM | POA: Diagnosis not present

## 2015-10-10 NOTE — Progress Notes (Signed)
[name], female   DOB: 16-Jan-1924, 82 y.o.   MRN: [id number] Complaint:  Visit Type: Patient returns to my office for continued preventative foot care services. Complaint: Patient states" my nails have grown long and thick and become painful to walk and wear shoes" Patient has been diagnosed with neuropathy both feet.. The patient presents for preventative foot care services. No changes to ROS.  She has painful callus both feet which is painful walking in her shoes  Podiatric Exam: Vascular: dorsalis pedis and posterior tibial pulses are palpable bilateral. Capillary return is immediate. Temperature gradient is WNL. Skin turgor WNL  Sensorium: Normal Semmes Weinstein monofilament test. Normal tactile sensation bilaterally. Nail Exam: Pt has thick disfigured discolored nails with subungual debris noted bilateral entire nail hallux through fifth toenails Ulcer Exam: There is no evidence of ulcer or pre-ulcerative changes or infection. Orthopedic Exam: Muscle tone and strength are WNL. No limitations in general ROM. No crepitus or effusions noted. Foot type and digits show no abnormalities. Bony prominences are unremarkable. Skin: porokeratosis sub 1,3 right foot and fourth toe left.  Callus plantar hallux left foot.  Diagnosis:  Onychomycosis, , Pain in right toe, pain in left toes, porokeratosis B/L  Treatment & Plan Procedures and Treatment: Consent by patient was obtained for treatment procedures. The patient understood the discussion of treatment and procedures well. All questions were answered thoroughly reviewed. Debridement of mycotic and hypertrophic toenails, 1 through 5 bilateral and clearing of subungual debris. No ulceration, no infection noted. Debridement of porokeratosis B/L Return Visit-Office Procedure: Patient instructed to return to the office for a follow up visit 3 months for continued evaluation and treatment.    Gardiner Barefoot DPM

## 2015-10-27 ENCOUNTER — Ambulatory Visit (INDEPENDENT_AMBULATORY_CARE_PROVIDER_SITE_OTHER): Payer: Medicare Other | Admitting: Internal Medicine

## 2015-10-27 ENCOUNTER — Encounter: Payer: Self-pay | Admitting: Internal Medicine

## 2015-10-27 VITALS — BP 140/80 | HR 80 | Temp 97.6°F | Resp 18 | Ht 62.0 in | Wt 133.8 lb

## 2015-10-27 DIAGNOSIS — K5901 Slow transit constipation: Secondary | ICD-10-CM

## 2015-10-27 DIAGNOSIS — M159 Polyosteoarthritis, unspecified: Secondary | ICD-10-CM | POA: Diagnosis not present

## 2015-10-27 DIAGNOSIS — I872 Venous insufficiency (chronic) (peripheral): Secondary | ICD-10-CM

## 2015-10-27 DIAGNOSIS — K224 Dyskinesia of esophagus: Secondary | ICD-10-CM | POA: Diagnosis not present

## 2015-10-27 DIAGNOSIS — I10 Essential (primary) hypertension: Secondary | ICD-10-CM | POA: Diagnosis not present

## 2015-10-27 DIAGNOSIS — R143 Flatulence: Secondary | ICD-10-CM

## 2015-10-27 DIAGNOSIS — R634 Abnormal weight loss: Secondary | ICD-10-CM

## 2015-10-27 DIAGNOSIS — J4520 Mild intermittent asthma, uncomplicated: Secondary | ICD-10-CM | POA: Diagnosis not present

## 2015-10-27 MED ORDER — SENNOSIDES-DOCUSATE SODIUM 8.6-50 MG PO TABS: 1.0000 | ORAL_TABLET | Freq: Every day | ORAL | Status: DC

## 2015-10-27 NOTE — Patient Instructions (Signed)
Use gaviscon for gas Use senna-s for hard stools--one tablet daily but reduce frequency if stools become too loose or frequent.  Restart your ensure.

## 2015-10-27 NOTE — Progress Notes (Signed)
[name], female   DOB: Dec 20, 1923, 82 y.o.   MRN: [id number]   Location: [facility] Provider: Rexene Edison. Mariea Clonts, D.O., C.M.D.  Code Status: DNR Goals of Care: Advanced Directive information Does patient have an advance directive?: No, Would patient like information on creating an advanced directive?: No - patient declined information  Chief Complaint  Patient presents with  . Medical Management of Chronic Issues    2 mo f/u    HPI: Patient is a 82 y.o. female seen in the office today for med mgt of chronic diseases.  Says she is generally doing well.    Says she needs something for gas.  Is bothersome in her side and back.  Is bothersome.  Bowels move slowly, not good.  Not really taking anything for constipation--tries to eat so she won't have problems, but sometimes she misses.  Does go daily, but sluggish.  Starts hard and then softens up.  Doesn't like to sit there so goes in the bathroom multiple times and waits.  Forgets what she used before to soften her stools.    Asks about bad breath.  Discussed that her acid indigestion and dry mouth are contributors.  Educated on hydration and use of spices that will counteract garlic and onion like parsley and cilantro in her food.  Cannot have mint due to gerd. Appetite is back--has been back for a while, but occasionally has an off day.  Doesn't get hungry much, but eats b/c she knows she should.  Has lost over 5 lbs though.  Says she is not sure why.    Tylenol is working very well for her joint pains.  Using 500mg  instead of 325 made a big difference.  Blood pressure was ok today for her age/risks.  Breathing doing good, too.  No shortness of breath walking around at home.  No dyspnea walking in here today either.  No wheezing.  Does report legs get heavy and tired sometimes.  No swelling.  Compression hose effective.  Vision is getting dimmer.  Right is more bothersome to her.  Right waters.  Last saw ophtho in  November '16 and goes back in mar '17.  Left sometimes hurts.  Has an extra eye drop for when it hurts and causes her to have headache.    Teeth:  No pain in teeth, but gums sore at times in one area.  Just went to dentist in Dec '16. Using sensodyne toothpaste recommended by them, but has not gotten rid of the problem yet.    Review of Systems:  Review of Systems  Constitutional: Positive for weight loss. Negative for fever and chills.  HENT: Negative for congestion.        Gum soreness during tooth brushing  Eyes: Positive for blurred vision, pain and discharge. Negative for double vision, photophobia and redness.  Respiratory: Negative for shortness of breath.   Cardiovascular: Negative for chest pain.  Gastrointestinal: Positive for abdominal pain and constipation. Negative for heartburn, diarrhea, blood in stool and melena.       Flatus  Genitourinary: Negative for dysuria.  Musculoskeletal: Negative for myalgias, back pain, joint pain and falls.  Skin: Negative for rash.  Neurological: Negative for dizziness.  Endo/Heme/Allergies: Does not bruise/bleed easily.  Psychiatric/Behavioral: Negative for depression.      Past Medical History  Diagnosis Date  . Benign essential hypertension   . Osteoarthritis   . GERD (gastroesophageal reflux disease)   . Seasonal allergies   .  Asthma   . Glaucoma   . PVD (peripheral vascular disease) ([place])   . Anxiety     Past Surgical History  Procedure Laterality Date  . Abdominal hysterectomy  1981  . Tonsillectomy  1953    Allergies  Allergen Reactions  . Aspirin   . Codeine   . Iodine     Pt is not aware of this allergy, does not recall much info....//a.c.  . Prednisone   . Sulfa Antibiotics       Medication List       This list is accurate as of: 10/27/15 11:07 AM.  Always use your most recent med list.               acetaminophen 500 MG tablet  Commonly known as:  TYLENOL  Take 1 tablet (500 mg total) by mouth every  6 (six) hours as needed for moderate pain.     albuterol 108 (90 Base) MCG/ACT inhaler  Commonly known as:  PROVENTIL HFA;VENTOLIN HFA  Inhale 2 puffs into the lungs every 6 (six) hours as needed for wheezing.     alum hydroxide-mag trisilicate [id number] MG Chew chewable tablet  Commonly known as:  GAVISCON  Chew 1 tablet by mouth 3 (three) times daily as needed for flatulence (bowel spasms).     amLODipine 5 MG tablet  Commonly known as:  NORVASC  TAKE 1 TABLET (5 MG TOTAL) BY MOUTH DAILY.     bimatoprost 0.01 % Soln  Commonly known as:  LUMIGAN  Place 1 drop into both eyes at bedtime.     brinzolamide 1 % ophthalmic suspension  Commonly known as:  AZOPT  Place 1 drop into both eyes 3 (three) times daily.     CALTRATE 600+D 600-400 MG-UNIT tablet  Generic drug:  Calcium Carbonate-Vitamin D  Take 2 tablets by mouth daily.     CoQ10 50 MG Caps  Take one tablet by mouth once daily     Fish Oil 1200 MG Cpdr  Take one tablet by mouth once daily     fluticasone 50 MCG/ACT nasal spray  Commonly known as:  FLONASE  Place 1 spray into the nose daily.     hydrocortisone 2.5 % rectal cream  Commonly known as:  ANUSOL-HC  Place 1 application rectally 2 (two) times daily.     lansoprazole 3 mg/ml Susp oral suspension  Commonly known as:  PREVACID  Take 10 mLs (30 mg total) by mouth daily before supper.     loteprednol 0.5 % ophthalmic suspension  Commonly known as:  LOTEMAX  Place 1 drop into the left eye daily.     nitroGLYCERIN 0.4 MG SL tablet  Commonly known as:  NITROSTAT  Place 1 tablet (0.4 mg total) under the tongue every 5 (five) minutes as needed for chest pain.     RESTASIS 0.05 % ophthalmic emulsion  Generic drug:  cycloSPORINE  Place 1 drop into both eyes 2 (two) times daily.     risperiDONE 0.5 MG tablet  Commonly known as:  RISPERDAL  TAKE 1 TABLET BY MOUTH TWICE A DAY     SENIOR MULTIVITAMIN PLUS Tabs  Take by mouth.     SYMBICORT 80-4.5 MCG/ACT inhaler    Generic drug:  budesonide-formoterol  Place 2 puffs into alternate nostrils 2 (two) times daily.     vitamin B-12 1000 MCG tablet  Commonly known as:  CYANOCOBALAMIN  Take 1,000 mcg by mouth daily.     vitamin C 500 MG  tablet  Commonly known as:  ASCORBIC ACID  Take 500 mg by mouth daily.        Health Maintenance  Topic Date Due  . MAMMOGRAM  10/07/2018 (Originally 09/28/2009)  . ZOSTAVAX  10/07/2018 (Originally 10/04/1986)  . TETANUS/TDAP  10/07/2018 (Originally 10/04/1945)  . INFLUENZA VACCINE  05/07/2016  . DEXA SCAN  Completed  . PNA vac Low Risk Adult  Completed    Physical Exam: Filed Vitals:   10/27/15 1102  BP: 140/80  Pulse: 80  Temp: 97.6 F (36.4 C)  TempSrc: Oral  Resp: 18  Height: 5\' 2"  (1.575 m)  Weight: 133 lb 12.8 oz (60.691 kg)  SpO2: 97%   Body mass index is 24.47 kg/(m^2). Physical Exam  Constitutional: She is oriented to person, place, and time. She appears well-developed and well-nourished. No distress.  Notable weight loss  HENT:  No notable erythema, swelling around her left mandibular molar  Eyes:  Wears glasses  Cardiovascular: Normal rate, regular rhythm, normal heart sounds and intact distal pulses.   Pulmonary/Chest: Effort normal and breath sounds normal. No respiratory distress.  Abdominal: Soft. Bowel sounds are normal. She exhibits no distension. There is no tenderness.  Musculoskeletal: Normal range of motion.  Uses cane for balance  Neurological: She is alert and oriented to person, place, and time.  Skin: Skin is warm and dry.  Psychiatric: She has a normal mood and affect.    Labs reviewed: Basic Metabolic Panel:  Recent Labs  11/17/14 1604 01/10/15 1059 03/20/15 1517  NA 139 144 143  K 3.9 4.0 4.2  CL 104 105 105  CO2 31 23 23   GLUCOSE 109* 120* 103*  BUN 15 20 20   CREATININE 0.85 1.01* 0.82  CALCIUM 9.3 9.6 9.3   Liver Function Tests:  Recent Labs  03/20/15 1517  AST 17  ALT 14  ALKPHOS 64   BILITOT 0.7  PROT 6.4  ALBUMIN 4.2   No results for input(s): LIPASE, AMYLASE in the last 8760 hours. No results for input(s): AMMONIA in the last 8760 hours. CBC:  Recent Labs  11/17/14 1604 01/10/15 1059 03/20/15 1517  WBC 5.0 5.2 5.2  NEUTROABS  --  3.0 3.1  HGB 11.8* 12.1  --   HCT 34.3* 36.3 32.8*  MCV 89.1 91 91  PLT 228 256 235   Lipid Panel: No results for input(s): CHOL, HDL, LDLCALC, TRIG, CHOLHDL, LDLDIRECT in the last 8760 hours. Lab Results  Component Value Date   HGBA1C 5.5 07/17/2015    Assessment/Plan 1. Slow transit constipation - bowels are moving regularly, but she has hard stools that become softer over time - senna-docusate (SENOKOT-S) 8.6-50 MG tablet; Take 1 tablet by mouth daily.  Dispense: 30 tablet; Refill: 3  2. Excessive flatus -ongoing problem b/c she did not realize the gaviscon on her med list last visit was for this problem--made it very clear on today's discharge instructions  3. Benign essential hypertension -bp under good control for her age and h/o lightheadedness  4. Chronic venous insufficiency -stable with her compression hose and low sodium diet  5. Esophageal dysmotility -doing better right now, cont prevacid  6. Osteoarthritis of multiple joints, unspecified osteoarthritis type -well controlled with use of tylenol arthritis, no changes, monitor  7. Asthma, mild intermittent, uncomplicated -cont symbicort, prn albuterol  8. Loss of weight -ongoing, she apparently stopped ensure b/c she was concerned it was causing her flatus and now lost another 5 lbs -will resume ensure  -she is to  call if she is still losing weight when she resumes daily ensure (likes vanilla or butter pecan flavors)  Labs/tests ordered: No orders of the defined types were placed in this encounter.   Next appt:  01/22/2016 med mgt esp weight loss  Brenlyn Beshara L. Hisham Provence, D.O. [facility] [address], [place] [address] Cell Phone (Mon-Fri 8am-5pm):  [phone] On Call:  [phone] & follow prompts after 5pm & weekends Office Phone:  [phone] Office Fax:  [phone]

## 2015-11-02 ENCOUNTER — Ambulatory Visit: Payer: Medicare Other | Admitting: Physician Assistant

## 2015-11-07 DIAGNOSIS — H02403 Unspecified ptosis of bilateral eyelids: Secondary | ICD-10-CM | POA: Diagnosis not present

## 2015-11-07 DIAGNOSIS — H401133 Primary open-angle glaucoma, bilateral, severe stage: Secondary | ICD-10-CM | POA: Diagnosis not present

## 2015-11-28 ENCOUNTER — Ambulatory Visit (INDEPENDENT_AMBULATORY_CARE_PROVIDER_SITE_OTHER): Payer: Medicare Other | Admitting: Physician Assistant

## 2015-11-28 ENCOUNTER — Encounter: Payer: Self-pay | Admitting: Physician Assistant

## 2015-11-28 VITALS — BP 144/70 | HR 66 | Ht 62.0 in | Wt 138.0 lb

## 2015-11-28 DIAGNOSIS — R1310 Dysphagia, unspecified: Secondary | ICD-10-CM

## 2015-11-28 NOTE — Patient Instructions (Signed)
Call to speak with nurse [phone] If you decide to proceed with the Endoscopy with dilation.  You will be established with Dr. Wilfrid Lund.

## 2015-11-28 NOTE — Progress Notes (Signed)
Thank you for sending this case to me. I have reviewed the entire note, and the outlined plan seems appropriate.   If patient /family desire EGD, must be scheduled at hospital due to advanced age

## 2015-11-28 NOTE — Progress Notes (Signed)
[name], female   DOB: 12-Nov-1923, 82 y.o.   MRN: [id number]   Subjective:    [name], female    DOB: [birth year], 82 y.o.   MRN: [id number]  HPI  [name] [name] is a pleasant 82 year old African-American female, new to GI today referred by Hollace Kinnier D.O. for evaluation of abnormal barium swallow. Patient has history of hypertension, controlled asthma, osteoarthritis, peripheral vascular disease and anxiety. She reports having a remote GI evaluation with Dr. Oletta Lamas longer than 20 years ago and does not recall the details. She thinks she did have an endoscopy but has never had a colonoscopy. Her current issue started about 2 years ago and she states have been somewhat progressive. She denies problems with odynophagia, but has been having dysphagia primarily to solids. She says the food goes down but may not reach her stomach. She frequently has to drink liquids while eating to help her "push things down". She also has frequent reflux symptoms after eating with regurgitation of food up into her mouth. She generally does not have to actually regurgitate into the commode. She denies any regular heartburn. No nocturnal symptoms that she is aware of. Her appetite has been fair, says she doesn't get hungry like she used to a weight has been relatively stable. Again she denies any odynophagia or abdominal pain. Barium swallow was done 09/21/2015 with finding of moderate to severe dysmotility of the esophagus and a narrowed GE junction with occasional passage of liquid into the stomach question achalasia versus distal esophageal stricture.  Review of Systems Pertinent positive and negative review of systems were noted in the above HPI section.  All other review of systems was otherwise negative.  Outpatient Encounter Prescriptions as of 11/28/2015  Medication Sig  . acetaminophen (TYLENOL) 500 MG tablet Take 1 tablet (500 mg total) by mouth every 6 (six) hours as needed for  moderate pain.  Marland Kitchen albuterol (PROVENTIL HFA;VENTOLIN HFA) 108 (90 BASE) MCG/ACT inhaler Inhale 2 puffs into the lungs every 6 (six) hours as needed for wheezing.  Marland Kitchen alum hydroxide-mag trisilicate (GAVISCON) [id number] MG CHEW chewable tablet Chew 1 tablet by mouth 3 (three) times daily as needed for flatulence (bowel spasms).  Marland Kitchen amLODipine (NORVASC) 5 MG tablet TAKE 1 TABLET (5 MG TOTAL) BY MOUTH DAILY.  . bimatoprost (LUMIGAN) 0.01 % SOLN Place 1 drop into both eyes at bedtime.  . brinzolamide (AZOPT) 1 % ophthalmic suspension Place 1 drop into both eyes 3 (three) times daily.  . Calcium Carbonate-Vitamin D (CALTRATE 600+D) 600-400 MG-UNIT per tablet Take 2 tablets by mouth daily.  . Coenzyme Q10 (COQ10) 50 MG CAPS Take one tablet by mouth once daily  . fluticasone (FLONASE) 50 MCG/ACT nasal spray Place 1 spray into the nose daily.  . hydrocortisone (ANUSOL-HC) 2.5 % rectal cream Place 1 application rectally 2 (two) times daily.  . lansoprazole (PREVACID) 3 mg/ml SUSP oral suspension Take 10 mLs (30 mg total) by mouth daily before supper.  . loteprednol (LOTEMAX) 0.5 % ophthalmic suspension Place 1 drop into the left eye daily.  . Multiple Vitamins-Minerals (SENIOR MULTIVITAMIN PLUS) TABS Take by mouth.  . nitroGLYCERIN (NITROSTAT) 0.4 MG SL tablet Place 1 tablet (0.4 mg total) under the tongue every 5 (five) minutes as needed for chest pain.  . Omega-3 Fatty Acids (FISH OIL) 1200 MG CPDR Take one tablet by mouth once daily  . RESTASIS 0.05 % ophthalmic emulsion Place 1 drop into both eyes 2 (two) times daily.   Marland Kitchen  risperiDONE (RISPERDAL) 0.5 MG tablet TAKE 1 TABLET BY MOUTH TWICE A DAY  . senna-docusate (SENOKOT-S) 8.6-50 MG tablet Take 1 tablet by mouth daily.  . SYMBICORT 80-4.5 MCG/ACT inhaler Place 2 puffs into alternate nostrils 2 (two) times daily.  . vitamin B-12 (CYANOCOBALAMIN) 1000 MCG tablet Take 1,000 mcg by mouth daily.  . vitamin C (ASCORBIC ACID) 500 MG tablet Take 500 mg by mouth  daily.   No facility-administered encounter medications on file as of 11/28/2015.   Allergies  Allergen Reactions  . Aspirin   . Codeine   . Iodine     Pt is not aware of this allergy, does not recall much info....//a.c.  . Prednisone   . Sulfa Antibiotics    Patient Active Problem List   Diagnosis Date Noted  . Asthma, mild intermittent 04/04/2014  . Gastroesophageal reflux disease without esophagitis 04/04/2014  . Neuropathy of both feet ([place]) 04/04/2014  . Right ankle swelling 01/03/2014  . Diuretic-induced hypokalemia 01/03/2014  . Olfactory hallucinations 11/23/2013  . SOB (shortness of breath) 10/26/2013  . Edema extremities 10/26/2013  . Paranoid type delusional disorder ([place]) 08/20/2013  . Benign essential hypertension   . Osteoarthritis   . Seasonal allergies   . Asthma   . Glaucoma   . PVD (peripheral vascular disease) ([place])   . Anxiety    Social History   Social History  . Marital Status: Married    Spouse Name: N/A  . Number of Children: N/A  . Years of Education: N/A   Occupational History  . Not on file.   Social History Main Topics  . Smoking status: Never Smoker   . Smokeless tobacco: Never Used  . Alcohol Use: No  . Drug Use: No  . Sexual Activity: Not on file   Other Topics Concern  . 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's family history includes Stroke in her father.      Objective:    Filed Vitals:   11/28/15 1015  BP: 144/70  Pulse: 66    Physical Exam  well-developed elderly African-American female in no acute distress, blood pressure 144/70 pulse 66 height 5 foot 2 weight 138 pounds. HEENT; nontraumatic cephalic EOMI PERRLA, Cardiovascular; regular rhythm with S1-S2 and S4, Pulmonary; clear bilaterally, Abdomen; soft nontender nondistended bowel sounds are active there is no palpable mass or hepatosplenomegaly, Rectal  ;exam not done Neuropsych mood and affect appropriate     Assessment & Plan:   #1 82 yo female with 2 year hx of dysphagia to solids and  frequent regurgitation- abnormal Barium swallow  concerning for Achalasia vs dysmotility and distal esophageal stricture . #2 HTN #3 Asthma #4 PVD #5 neuropathy  Plan;  Long discussion with pt and daughter regarding EGD with dilation if indicated , patient would like to think about this and will call back if she decides to proceed with EGD. We discussed risks and benefits and increased risk of complication given her advanced age. In the interim continue management with small bites, cutting food up into tiny pieces and sipping liquids while eating. Patient to remain upright for 2 hours postprandially.  Patient will be established with Dr. Loletha Carrow.   [name] Kamer S Tryphena Perkovich PA-C 11/28/2015   Cc: Gayland Curry, DO

## 2015-12-09 ENCOUNTER — Emergency Department (HOSPITAL_COMMUNITY): Payer: Medicare Other

## 2015-12-09 ENCOUNTER — Encounter (HOSPITAL_COMMUNITY): Payer: Self-pay | Admitting: Family Medicine

## 2015-12-09 ENCOUNTER — Inpatient Hospital Stay (HOSPITAL_COMMUNITY)
Admission: EM | Admit: 2015-12-09 | Discharge: 2015-12-11 | DRG: 066 | Disposition: A | Discharge status: Home Health | Payer: Medicare Other | Attending: Family Medicine | Admitting: Family Medicine

## 2015-12-09 DIAGNOSIS — Z7951 Long term (current) use of inhaled steroids: Secondary | ICD-10-CM

## 2015-12-09 DIAGNOSIS — H409 Unspecified glaucoma: Secondary | ICD-10-CM | POA: Diagnosis present

## 2015-12-09 DIAGNOSIS — K219 Gastro-esophageal reflux disease without esophagitis: Secondary | ICD-10-CM | POA: Diagnosis present

## 2015-12-09 DIAGNOSIS — R42 Dizziness and giddiness: Secondary | ICD-10-CM | POA: Diagnosis not present

## 2015-12-09 DIAGNOSIS — Z79899 Other long term (current) drug therapy: Secondary | ICD-10-CM

## 2015-12-09 DIAGNOSIS — I6340 Cerebral infarction due to embolism of unspecified cerebral artery: Secondary | ICD-10-CM | POA: Insufficient documentation

## 2015-12-09 DIAGNOSIS — F419 Anxiety disorder, unspecified: Secondary | ICD-10-CM | POA: Diagnosis present

## 2015-12-09 DIAGNOSIS — E785 Hyperlipidemia, unspecified: Secondary | ICD-10-CM | POA: Diagnosis present

## 2015-12-09 DIAGNOSIS — I639 Cerebral infarction, unspecified: Secondary | ICD-10-CM | POA: Diagnosis not present

## 2015-12-09 DIAGNOSIS — I6789 Other cerebrovascular disease: Secondary | ICD-10-CM | POA: Diagnosis not present

## 2015-12-09 DIAGNOSIS — J45909 Unspecified asthma, uncomplicated: Secondary | ICD-10-CM | POA: Diagnosis present

## 2015-12-09 DIAGNOSIS — I10 Essential (primary) hypertension: Secondary | ICD-10-CM | POA: Diagnosis not present

## 2015-12-09 DIAGNOSIS — I638 Other cerebral infarction: Secondary | ICD-10-CM | POA: Diagnosis not present

## 2015-12-09 LAB — I-STAT CHEM 8, ED
BUN: 22 mg/dL — AB (ref 6–20)
CHLORIDE: 105 mmol/L (ref 101–111)
CREATININE: 0.9 mg/dL (ref 0.44–1.00)
Calcium, Ion: 1.18 mmol/L (ref 1.13–1.30)
GLUCOSE: 122 mg/dL — AB (ref 65–99)
HEMATOCRIT: 37 % (ref 36.0–46.0)
HEMOGLOBIN: 12.6 g/dL (ref 12.0–15.0)
POTASSIUM: 3.9 mmol/L (ref 3.5–5.1)
Sodium: 142 mmol/L (ref 135–145)
TCO2: 22 mmol/L (ref 0–100)

## 2015-12-09 LAB — DIFFERENTIAL
BASOS ABS: 0 10*3/uL (ref 0.0–0.1)
Basophils Relative: 0 %
Eosinophils Absolute: 0.1 10*3/uL (ref 0.0–0.7)
Eosinophils Relative: 2 %
LYMPHS PCT: 29 %
Lymphs Abs: 1.5 10*3/uL (ref 0.7–4.0)
MONO ABS: 0.6 10*3/uL (ref 0.1–1.0)
MONOS PCT: 12 %
NEUTROS ABS: 3.1 10*3/uL (ref 1.7–7.7)
Neutrophils Relative %: 57 %

## 2015-12-09 LAB — CBC
HCT: 33.7 % — ABNORMAL LOW (ref 36.0–46.0)
Hemoglobin: 11.5 g/dL — ABNORMAL LOW (ref 12.0–15.0)
MCH: 30.7 pg (ref 26.0–34.0)
MCHC: 34.1 g/dL (ref 30.0–36.0)
MCV: 90.1 fL (ref 78.0–100.0)
PLATELETS: 211 10*3/uL (ref 150–400)
RBC: 3.74 MIL/uL — ABNORMAL LOW (ref 3.87–5.11)
RDW: 14.9 % (ref 11.5–15.5)
WBC: 5.3 10*3/uL (ref 4.0–10.5)

## 2015-12-09 LAB — URINALYSIS, ROUTINE W REFLEX MICROSCOPIC
Bilirubin Urine: NEGATIVE
GLUCOSE, UA: NEGATIVE mg/dL
HGB URINE DIPSTICK: NEGATIVE
Ketones, ur: NEGATIVE mg/dL
Nitrite: NEGATIVE
PH: 6.5 (ref 5.0–8.0)
PROTEIN: NEGATIVE mg/dL
SPECIFIC GRAVITY, URINE: 1.008 (ref 1.005–1.030)

## 2015-12-09 LAB — COMPREHENSIVE METABOLIC PANEL
ALK PHOS: 63 U/L (ref 38–126)
ALT: 18 U/L (ref 14–54)
AST: 22 U/L (ref 15–41)
Albumin: 3.7 g/dL (ref 3.5–5.0)
Anion gap: 9 (ref 5–15)
BUN: 20 mg/dL (ref 6–20)
CALCIUM: 9.3 mg/dL (ref 8.9–10.3)
CHLORIDE: 108 mmol/L (ref 101–111)
CO2: 25 mmol/L (ref 22–32)
CREATININE: 0.8 mg/dL (ref 0.44–1.00)
GFR calc non Af Amer: >60 mL/min (ref >60)
Glucose, Bld: 122 mg/dL — ABNORMAL HIGH (ref 65–99)
Potassium: 3.9 mmol/L (ref 3.5–5.1)
SODIUM: 142 mmol/L (ref 135–145)
Total Bilirubin: 0.9 mg/dL (ref 0.3–1.2)
Total Protein: 6.6 g/dL (ref 6.5–8.1)

## 2015-12-09 LAB — I-STAT TROPONIN, ED: Troponin i, poc: 0.03 ng/mL (ref 0.00–0.08)

## 2015-12-09 LAB — URINE MICROSCOPIC-ADD ON
BACTERIA UA: NONE SEEN
RBC / HPF: NONE SEEN RBC/hpf (ref 0–5)
Squamous Epithelial / LPF: NONE SEEN

## 2015-12-09 LAB — PROTIME-INR
INR: 1.12 (ref 0.00–1.49)
PROTHROMBIN TIME: 14.6 s (ref 11.6–15.2)

## 2015-12-09 LAB — APTT: aPTT: 32 seconds (ref 24–37)

## 2015-12-09 LAB — CBG MONITORING, ED: Glucose-Capillary: 103 mg/dL — ABNORMAL HIGH (ref 65–99)

## 2015-12-09 MED ORDER — CLOPIDOGREL BISULFATE 75 MG PO TABS
300.0000 mg | ORAL_TABLET | Freq: Once | ORAL | Status: AC
  Administered 2015-12-09: 300 mg via ORAL
  Filled 2015-12-09: qty 4

## 2015-12-09 MED ORDER — LOTEPREDNOL ETABONATE 0.5 % OP SUSP
1.0000 [drp] | Freq: Every day | OPHTHALMIC | Status: DC
  Administered 2015-12-09 – 2015-12-11 (×3): 1 [drp] via OPHTHALMIC
  Filled 2015-12-09: qty 5

## 2015-12-09 MED ORDER — LATANOPROST 0.005 % OP SOLN
1.0000 [drp] | Freq: Every day | OPHTHALMIC | Status: DC
  Administered 2015-12-09 – 2015-12-10 (×2): 1 [drp] via OPHTHALMIC
  Filled 2015-12-09: qty 2.5

## 2015-12-09 MED ORDER — HYDROCORTISONE 2.5 % RE CREA
1.0000 "application " | TOPICAL_CREAM | Freq: Two times a day (BID) | RECTAL | Status: DC
  Administered 2015-12-09 – 2015-12-11 (×4): 1 via RECTAL
  Filled 2015-12-09: qty 28.35

## 2015-12-09 MED ORDER — MOMETASONE FURO-FORMOTEROL FUM 100-5 MCG/ACT IN AERO
2.0000 | INHALATION_SPRAY | Freq: Two times a day (BID) | RESPIRATORY_TRACT | Status: DC
  Administered 2015-12-09 – 2015-12-10 (×2): 2 via RESPIRATORY_TRACT
  Filled 2015-12-09 (×2): qty 8.8

## 2015-12-09 MED ORDER — STROKE: EARLY STAGES OF RECOVERY BOOK
Freq: Once | Status: AC
Start: 2015-12-09 — End: 2015-12-09
  Administered 2015-12-09: 22:00:00
  Filled 2015-12-09: qty 1

## 2015-12-09 MED ORDER — PANTOPRAZOLE SODIUM 40 MG PO TBEC
40.0000 mg | DELAYED_RELEASE_TABLET | Freq: Every day | ORAL | Status: DC
  Administered 2015-12-10 – 2015-12-11 (×2): 40 mg via ORAL
  Filled 2015-12-09 (×2): qty 1

## 2015-12-09 MED ORDER — RISPERIDONE 0.5 MG PO TABS
0.5000 mg | ORAL_TABLET | Freq: Two times a day (BID) | ORAL | Status: DC
  Administered 2015-12-09 – 2015-12-11 (×4): 0.5 mg via ORAL
  Filled 2015-12-09 (×4): qty 1

## 2015-12-09 MED ORDER — ACETAMINOPHEN 650 MG RE SUPP: 650.0000 mg | RECTAL | Status: DC | PRN

## 2015-12-09 MED ORDER — NITROGLYCERIN 0.4 MG SL SUBL
0.4000 mg | SUBLINGUAL_TABLET | SUBLINGUAL | Status: DC | PRN
Start: 2015-12-09 — End: 2015-12-11

## 2015-12-09 MED ORDER — BRINZOLAMIDE 1 % OP SUSP
1.0000 [drp] | Freq: Three times a day (TID) | OPHTHALMIC | Status: DC
  Administered 2015-12-09 – 2015-12-11 (×6): 1 [drp] via OPHTHALMIC
  Filled 2015-12-09: qty 10

## 2015-12-09 MED ORDER — LANSOPRAZOLE 3 MG/ML SUSP
30.0000 mg | Freq: Every day | ORAL | Status: DC
  Filled 2015-12-09: qty 10

## 2015-12-09 MED ORDER — SENNOSIDES-DOCUSATE SODIUM 8.6-50 MG PO TABS
1.0000 | ORAL_TABLET | Freq: Every day | ORAL | Status: DC
  Administered 2015-12-09 – 2015-12-11 (×3): 1 via ORAL
  Filled 2015-12-09 (×3): qty 1

## 2015-12-09 MED ORDER — CYCLOSPORINE 0.05 % OP EMUL
1.0000 [drp] | Freq: Two times a day (BID) | OPHTHALMIC | Status: DC
Start: 2015-12-09 — End: 2015-12-11
  Administered 2015-12-09 – 2015-12-11 (×4): 1 [drp] via OPHTHALMIC
  Filled 2015-12-09 (×5): qty 1

## 2015-12-09 MED ORDER — AMLODIPINE BESYLATE 5 MG PO TABS
5.0000 mg | ORAL_TABLET | Freq: Every day | ORAL | Status: DC
  Administered 2015-12-10: 5 mg via ORAL
  Filled 2015-12-09: qty 1

## 2015-12-09 MED ORDER — ALBUTEROL SULFATE (2.5 MG/3ML) 0.083% IN NEBU: 3.0000 mL | INHALATION_SOLUTION | Freq: Four times a day (QID) | RESPIRATORY_TRACT | Status: DC | PRN

## 2015-12-09 MED ORDER — ACETAMINOPHEN 325 MG PO TABS
650.0000 mg | ORAL_TABLET | ORAL | Status: DC | PRN
  Administered 2015-12-10 (×2): 650 mg via ORAL
  Filled 2015-12-09 (×2): qty 2

## 2015-12-09 MED ORDER — CLOPIDOGREL BISULFATE 75 MG PO TABS
75.0000 mg | ORAL_TABLET | Freq: Every day | ORAL | Status: DC
  Administered 2015-12-10 – 2015-12-11 (×2): 75 mg via ORAL
  Filled 2015-12-09 (×2): qty 1

## 2015-12-09 MED ORDER — FLUTICASONE PROPIONATE 50 MCG/ACT NA SUSP
1.0000 | Freq: Every day | NASAL | Status: DC
  Administered 2015-12-09 – 2015-12-11 (×3): 1 via NASAL
  Filled 2015-12-09: qty 16

## 2015-12-09 NOTE — ED Notes (Signed)
Wheeled pt. To the bathroom , gait steady walking to the bathroom, Standby assist needed. Pt. Denies any dizziness .

## 2015-12-09 NOTE — Consult Note (Signed)
Neurology Consultation Reason for Consult: Dizziness Referring Physician: Lily Kocher  CC: Dizziness  History is obtained from: Patient  HPI: [name] [name] is a 82 y.o. female who has had 2 episodes of dizziness in the past week. The first one happened last Wednesday when she describes it as vertigo. It lasted for a few seconds and then resolves. She had a second today which lasted for slightly longer, however also resolved within less than a minute. Due to this, she sought care in the emergency department tonight where an MRI was performed which shows 2 punctate infarcts in the posterior circulation.   LKW: Wednesday tpa given?: no, symptoms resolved Premorbid modified rankin scale: 0     ROS: A 14 point ROS was performed and is negative except as noted in the HPI.   Past Medical History  Diagnosis Date  . Benign essential hypertension   . Osteoarthritis   . GERD (gastroesophageal reflux disease)   . Seasonal allergies   . Asthma   . Glaucoma   . PVD (peripheral vascular disease) ([place])   . Anxiety      Family History  Problem Relation Age of Onset  . Stroke Father      Social History:  reports that she has never smoked. She has never used smokeless tobacco. She reports that she does not drink alcohol or use illicit drugs.   Exam: Current vital signs: BP 157/50 mmHg  Pulse 59  Temp(Src) 97.9 F (36.6 C) (Oral)  Resp 19  Ht 5\' 2"  (1.575 m)  Wt 57.97 kg (127 lb 12.8 oz)  BMI 23.37 kg/m2  SpO2 99% Vital signs in last 24 hours: Temp:  [97.8 F (36.6 C)-98 F (36.7 C)] 97.9 F (36.6 C) (03/04 2200) Pulse Rate:  [54-78] 59 (03/04 2200) Resp:  [14-24] 19 (03/04 1730) BP: (116-185)/(36-97) 157/50 mmHg (03/04 2200) SpO2:  [97 %-100 %] 99 % (03/04 2200) Weight:  [57.97 kg (127 lb 12.8 oz)] 57.97 kg (127 lb 12.8 oz) (03/04 1957)   Physical Exam  Constitutional: Appears well-developed and well-nourished.  Psych: Affect appropriate to situation Eyes: No  scleral injection HENT: No OP obstrucion Head: Normocephalic.  Cardiovascular: Normal rate and regular rhythm.  Respiratory: Effort normal and breath sounds normal to anterior ascultation GI: Soft.  No distension. There is no tenderness.  Skin: WDI  Neuro: Mental Status: Patient is awake, alert, oriented to person, place, month, year, and situation. Patient is able to give a clear and coherent history. No signs of aphasia or neglect Cranial Nerves: II: Visual Fields are full. Pupils are equal, round, and reactive to light.   III,IV, VI: EOMI without ptosis or diploplia.  V: Facial sensation is symmetric to temperature VII: Facial movement is symmetric.  VIII: hearing is intact to voice X: Uvula elevates symmetrically XI: Shoulder shrug is symmetric. XII: tongue is midline without atrophy or fasciculations.  Motor: Tone is normal. Bulk is normal. 5/5 strength was present in all four extremities.  Sensory: Sensation is symmetric to light touch and temperature in the arms and legs. Cerebellar: FNF and HKS are intact bilaterally   I have reviewed labs in epic and the results pertinent to this consultation are: CMP-unremarkable  I have reviewed the images obtained: MRI brain-punctate infarcts in the posterior circulation  Impression: 82 year old female with 2 episodes of transient vertigo with posterior circulation infarcts.  I am concerned about possible posterior circulation disease. She will need to be started on antiplatelet therapy, and is allergic to aspirin.Given  that she has had 2 episodes in such a short period of time, I will load her with Plavix given therapeutic slight faster.  Recommendations: 1. HgbA1c, fasting lipid panel 2. MRI, MRA  of the brain without contrast 3. Frequent neuro checks 4. Echocardiogram 5. Carotid dopplers 6. Prophylactic therapy-Antiplatelet med: Plavix 300 mg followed by 75 mg daily 7. Risk factor modification 8. Telemetry monitoring 9. PT  consult, OT consult, Speech consult 10. please page stroke NP  Or  PA  Or MD  M-F from 8am -4 pm starting 3/5 as this patient will be followed by the stroke team at this point.   You can look them up on [email or web address]  Password [email or web address]    Roland Rack, MD [facility] [phone]  If 7pm- 7am, please page neurology on call as listed in [place].

## 2015-12-09 NOTE — ED Notes (Signed)
Pt MRI at this time 

## 2015-12-09 NOTE — ED Provider Notes (Signed)
CSN: [id number]     Arrival date & time 12/09/15  1345 History   First MD Initiated Contact with Patient 12/09/15 1401     Chief Complaint  Patient presents with  . Dizziness     (Consider location/radiation/quality/duration/timing/severity/associated sxs/prior Treatment) HPI  82 year old female with history of hypertension, asthma, peripheral vascular disease, anxiety, seasonal allergies, GERD presented to the ED from home for evaluation of dizziness. Patient report 4 days ago she was walking when she experiencing a transient episode of dizziness in which she described as mild lightheadedness and room spinning sensation lasting for a few seconds. She stopped from walking, rest and his symptoms resolved. It has not resumed her today while she was sitting when she experiencing a room spinning sensation lasting for less than 10 seconds. Although symptoms resolve, the patient decided to come to ER for further evaluation. There was no associated headache, vision changes, ear pain, hearing changes, diplopia, neck pain, URI symptoms, chest pain, difficulty breathing, abdominal pain, nausea vomiting diarrhea, fever or chills, dysuria, or bloody stools. Patient has never had a stroke before. She denies any precipitating factor. She has cataract surgery several years ago and denies any worsening vision changes. She reports intermittent shortness of breath for an unknown amount of time but states it does not bother her affect her daily activities. She lives at home by herself. She is not on any blood thinner medication.     Past Medical History  Diagnosis Date  . Benign essential hypertension   . Osteoarthritis   . GERD (gastroesophageal reflux disease)   . Seasonal allergies   . Asthma   . Glaucoma   . PVD (peripheral vascular disease) ([place])   . Anxiety    Past Surgical History  Procedure Laterality Date  . Abdominal hysterectomy  1981  . Tonsillectomy  1953   Family History  Problem Relation  Age of Onset  . Stroke Father    Social History  Substance Use Topics  . Smoking status: Never Smoker   . Smokeless tobacco: Never Used  . Alcohol Use: No   OB History    No data available     Review of Systems  All other systems reviewed and are negative.     Allergies  Aspirin; Codeine; Iodine; Prednisone; and Sulfa antibiotics  Home Medications   Prior to Admission medications   Medication Sig Start Date End Date Taking? Authorizing Provider  acetaminophen (TYLENOL) 500 MG tablet Take 1 tablet (500 mg total) by mouth every 6 (six) hours as needed for moderate pain. 03/20/15   Tiffany L Reed, DO  albuterol (PROVENTIL HFA;VENTOLIN HFA) 108 (90 BASE) MCG/ACT inhaler Inhale 2 puffs into the lungs every 6 (six) hours as needed for wheezing.    Historical Provider, MD  alum hydroxide-mag trisilicate (GAVISCON) [id number] MG CHEW chewable tablet Chew 1 tablet by mouth 3 (three) times daily as needed for flatulence (bowel spasms). 08/24/15   Tiffany L Reed, DO  amLODipine (NORVASC) 5 MG tablet TAKE 1 TABLET (5 MG TOTAL) BY MOUTH DAILY. 08/01/15   Tiffany L Reed, DO  bimatoprost (LUMIGAN) 0.01 % SOLN Place 1 drop into both eyes at bedtime.    Historical Provider, MD  brinzolamide (AZOPT) 1 % ophthalmic suspension Place 1 drop into both eyes 3 (three) times daily.    Historical Provider, MD  Calcium Carbonate-Vitamin D (CALTRATE 600+D) 600-400 MG-UNIT per tablet Take 2 tablets by mouth daily.    Historical Provider, MD  Coenzyme Q10 (COQ10) 50 MG CAPS  Take one tablet by mouth once daily    Historical Provider, MD  fluticasone (FLONASE) 50 MCG/ACT nasal spray Place 1 spray into the nose daily.    Historical Provider, MD  hydrocortisone (ANUSOL-HC) 2.5 % rectal cream Place 1 application rectally 2 (two) times daily. 03/20/15   Tiffany L Reed, DO  lansoprazole (PREVACID) 3 mg/ml SUSP oral suspension Take 10 mLs (30 mg total) by mouth daily before supper. 12/01/14   Tiffany L Reed, DO  loteprednol  (LOTEMAX) 0.5 % ophthalmic suspension Place 1 drop into the left eye daily.    Historical Provider, MD  Multiple Vitamins-Minerals (SENIOR MULTIVITAMIN PLUS) TABS Take by mouth.    Historical Provider, MD  nitroGLYCERIN (NITROSTAT) 0.4 MG SL tablet Place 1 tablet (0.4 mg total) under the tongue every 5 (five) minutes as needed for chest pain. 11/17/14   Debby Freiberg, MD  Omega-3 Fatty Acids (FISH OIL) 1200 MG CPDR Take one tablet by mouth once daily    Historical Provider, MD  RESTASIS 0.05 % ophthalmic emulsion Place 1 drop into both eyes 2 (two) times daily.  06/02/14   Historical Provider, MD  risperiDONE (RISPERDAL) 0.5 MG tablet TAKE 1 TABLET BY MOUTH TWICE A DAY 09/29/15   Tiffany L Reed, DO  senna-docusate (SENOKOT-S) 8.6-50 MG tablet Take 1 tablet by mouth daily. 10/27/15   Tiffany L Reed, DO  SYMBICORT 80-4.5 MCG/ACT inhaler Place 2 puffs into alternate nostrils 2 (two) times daily. 10/17/14   Historical Provider, MD  vitamin B-12 (CYANOCOBALAMIN) 1000 MCG tablet Take 1,000 mcg by mouth daily.    Historical Provider, MD  vitamin C (ASCORBIC ACID) 500 MG tablet Take 500 mg by mouth daily.    Historical Provider, MD   BP 165/63 mmHg  Pulse 78  Temp(Src) 97.8 F (36.6 C) (Oral)  Resp 20  SpO2 97% Physical Exam  Constitutional: She is oriented to person, place, and time. She appears well-developed and well-nourished. No distress.  Elderly African-American female laying in bed in no acute discomfort and nontoxic in appearance  HENT:  Head: Atraumatic.  Right Ear: External ear normal.  Left Ear: External ear normal.  Eyes: Right eye exhibits no discharge. Left eye exhibits no discharge.  Patient has unequal pupil: tear drop shape R pupil 99mm.  L pupil is 60mm and non reactive.  Evidence of corneal arcus senilis.    Extraocular movements intact, no evidence of nystagmus.  Neck: Normal range of motion. Neck supple.  Cardiovascular: Normal rate and regular rhythm.   Pulmonary/Chest: Effort  normal and breath sounds normal. No respiratory distress. She has no wheezes.  Abdominal: There is no tenderness.  Neurological: She is alert and oriented to person, place, and time. She has normal strength. No cranial nerve deficit or sensory deficit. She displays a negative Romberg sign. Coordination and gait normal. GCS eye subscore is 4. GCS verbal subscore is 5. GCS motor subscore is 6.  Normal gait.    Skin: No rash noted.  Psychiatric: She has a normal mood and affect.  Nursing note and vitals reviewed.   ED Course  Procedures (including critical care time) Labs Review Labs Reviewed  CBC - Abnormal; Notable for the following:    RBC 3.74 (*)    Hemoglobin 11.5 (*)    HCT 33.7 (*)    All other components within normal limits  COMPREHENSIVE METABOLIC PANEL - Abnormal; Notable for the following:    Glucose, Bld 122 (*)    All other components within normal limits  I-STAT CHEM 8, ED - Abnormal; Notable for the following:    BUN 22 (*)    Glucose, Bld 122 (*)    All other components within normal limits  PROTIME-INR  APTT  DIFFERENTIAL  URINALYSIS, ROUTINE W REFLEX MICROSCOPIC (NOT AT [facility])  I-STAT TROPOININ, ED  CBG MONITORING, ED    Imaging Review Ct Head Wo Contrast  12/09/2015  CLINICAL DATA:  Intermittent dizzy spells, one on Wednesday and another today, some shortness of breath, history asthma, peripheral vascular disease, GERD EXAM: CT HEAD WITHOUT CONTRAST TECHNIQUE: Contiguous axial images were obtained from the base of the skull through the vertex without intravenous contrast. COMPARISON:  None. FINDINGS: Generalized atrophy. Normal ventricular morphology. No midline shift or mass effect. Small vessel chronic ischemic changes of deep cerebral white matter. No intracranial hemorrhage, mass lesion, or acute infarction. Visualized paranasal sinuses and mastoid air cells clear. Bones unremarkable. Atherosclerotic calcifications at the carotid siphons. IMPRESSION: Atrophy  with small vessel chronic ischemic changes of deep cerebral white matter. No acute intracranial abnormalities. Electronically Signed   By: Lavonia Dana M.D.   On: 12/09/2015 15:15   I have personally reviewed and evaluated these images and lab results as part of my medical decision-making.   EKG Interpretation   Date/Time:  Saturday December 09 2015 14:18:08 EST Ventricular Rate:  67 PR Interval:  228 QRS Duration: 138 QT Interval:  437 QTC Calculation: 461 R Axis:   86 Text Interpretation:  Sinus rhythm Prolonged PR interval Probable left  atrial enlargement Right bundle branch block Confirmed by ZACKOWSKI  MD,  SCOTT ([id number]) on 12/09/2015 3:31:06 PM      MDM   Final diagnoses:  Vertigo  Cerebral infarction due to unspecified mechanism    BP 158/61 mmHg  Pulse 55  Temp(Src) 98.3 F (36.8 C) (Oral)  Resp 18  Ht 5\' 2"  (1.575 m)  Wt 57.97 kg  BMI 23.37 kg/m2  SpO2 96%   3:12 PM Pt with 2 separate episodes of vertiginous sxs lasting for less than 10 seconds each, most recent was today.  She is asymptomatic at this time.  No focal neuro deficits on exam.  Pt however has a tear-shape pupil in R eye and a dilated pupil , however she denies vision changes.  She has a negative HINTS exam.  Given her age, work up initiated.  Care discussed with Dr. Rogene Houston.    3:35 PM EKG without concerning arrhythmia or acute ischemic changes. Head CT scan without acute intracranial abnormality. Electrolytes panel also reassuring. She has normal H&H, mild anemia with hemoglobin of 11.5. UA pending.  Normal CBG.  Normal troponin.  Normal orthostatic vital sign.  Pt is sxs free.  Dr. Rogene Houston has seen pt and ordered brain MRI to r/o potential posterior circulation stroke.  If negative, pt can be discharge.  Dr. Rogene Houston will take over the remainder of pt care.    Domenic Moras, PA-C 12/10/15 [id number]  Quintella Reichert, MD 12/10/15 [phone]

## 2015-12-09 NOTE — Progress Notes (Signed)
Arrived from ED. Alert and oriented. Denies any pain. Oriented to room with verbalized understanding. Will continue to monitor.

## 2015-12-09 NOTE — ED Provider Notes (Addendum)
Medical screening examination/treatment/procedure(s) were conducted as a shared visit with non-physician practitioner(s) and myself.  I personally evaluated the patient during the encounter.   EKG Interpretation   Date/Time:  Saturday December 09 2015 14:18:08 EST Ventricular Rate:  67 PR Interval:  228 QRS Duration: 138 QT Interval:  437 QTC Calculation: 461 R Axis:   86 Text Interpretation:  Sinus rhythm Prolonged PR interval Probable left  atrial enlargement Right bundle branch block Confirmed by Glendi Mohiuddin  MD,  Felica Chargois ([id number]) on 12/09/2015 3:31:06 PM       The patient seen by me. Patient with complaint of intermittent dizzy spells vertigo since Wednesday. Last episode was today. Patient denies any weakness or numbness or any headaches. A speech problems. Workup shows evidence of acute infarct in the temporal area and in the posterior circulation of the cerebellum. Could explain her symptoms. We'll discuss with the [facility] is most likely will require admission by the hospitalist service.   Results for orders placed or performed during the hospital encounter of 12/09/15  Protime-INR  Result Value Ref Range   Prothrombin Time 14.6 11.6 - 15.2 seconds   INR 1.12 0.00 - 1.49  APTT  Result Value Ref Range   aPTT 32 24 - 37 seconds  CBC  Result Value Ref Range   WBC 5.3 4.0 - 10.5 K/uL   RBC 3.74 (L) 3.87 - 5.11 MIL/uL   Hemoglobin 11.5 (L) 12.0 - 15.0 g/dL   HCT 33.7 (L) 36.0 - 46.0 %   MCV 90.1 78.0 - 100.0 fL   MCH 30.7 26.0 - 34.0 pg   MCHC 34.1 30.0 - 36.0 g/dL   RDW 14.9 11.5 - 15.5 %   Platelets 211 150 - 400 K/uL  Differential  Result Value Ref Range   Neutrophils Relative % 57 %   Neutro Abs 3.1 1.7 - 7.7 K/uL   Lymphocytes Relative 29 %   Lymphs Abs 1.5 0.7 - 4.0 K/uL   Monocytes Relative 12 %   Monocytes Absolute 0.6 0.1 - 1.0 K/uL   Eosinophils Relative 2 %   Eosinophils Absolute 0.1 0.0 - 0.7 K/uL   Basophils Relative 0 %   Basophils Absolute 0.0 0.0 -  0.1 K/uL  Comprehensive metabolic panel  Result Value Ref Range   Sodium 142 135 - 145 mmol/L   Potassium 3.9 3.5 - 5.1 mmol/L   Chloride 108 101 - 111 mmol/L   CO2 25 22 - 32 mmol/L   Glucose, Bld 122 (H) 65 - 99 mg/dL   BUN 20 6 - 20 mg/dL   Creatinine, Ser 0.80 0.44 - 1.00 mg/dL   Calcium 9.3 8.9 - 10.3 mg/dL   Total Protein 6.6 6.5 - 8.1 g/dL   Albumin 3.7 3.5 - 5.0 g/dL   AST 22 15 - 41 U/L   ALT 18 14 - 54 U/L   Alkaline Phosphatase 63 38 - 126 U/L   Total Bilirubin 0.9 0.3 - 1.2 mg/dL   GFR calc non Af Amer >60 >60 mL/min   GFR calc Af Amer >60 >60 mL/min   Anion gap 9 5 - 15  Urinalysis, Routine w reflex microscopic (not at [facility])  Result Value Ref Range   Color, Urine YELLOW YELLOW   APPearance CLEAR CLEAR   Specific Gravity, Urine 1.008 1.005 - 1.030   pH 6.5 5.0 - 8.0   Glucose, UA NEGATIVE NEGATIVE mg/dL   Hgb urine dipstick NEGATIVE NEGATIVE   Bilirubin Urine NEGATIVE NEGATIVE   Ketones,  ur NEGATIVE NEGATIVE mg/dL   Protein, ur NEGATIVE NEGATIVE mg/dL   Nitrite NEGATIVE NEGATIVE   Leukocytes, UA SMALL (A) NEGATIVE  Urine microscopic-add on  Result Value Ref Range   Squamous Epithelial / LPF NONE SEEN NONE SEEN   WBC, UA 0-5 0 - 5 WBC/hpf   RBC / HPF NONE SEEN 0 - 5 RBC/hpf   Bacteria, UA NONE SEEN NONE SEEN  I-stat troponin, ED (not at [facility])  Result Value Ref Range   Troponin i, poc 0.03 0.00 - 0.08 ng/mL   Comment 3          CBG monitoring, ED  Result Value Ref Range   Glucose-Capillary 103 (H) 65 - 99 mg/dL  I-Stat Chem 8, ED  (not at [facility])  Result Value Ref Range   Sodium 142 135 - 145 mmol/L   Potassium 3.9 3.5 - 5.1 mmol/L   Chloride 105 101 - 111 mmol/L   BUN 22 (H) 6 - 20 mg/dL   Creatinine, Ser 0.90 0.44 - 1.00 mg/dL   Glucose, Bld 122 (H) 65 - 99 mg/dL   Calcium, Ion 1.18 1.13 - 1.30 mmol/L   TCO2 22 0 - 100 mmol/L   Hemoglobin 12.6 12.0 - 15.0 g/dL   HCT 37.0 36.0 - 46.0 %   Ct Head Wo Contrast  12/09/2015  CLINICAL DATA:   Intermittent dizzy spells, one on Wednesday and another today, some shortness of breath, history asthma, peripheral vascular disease, GERD EXAM: CT HEAD WITHOUT CONTRAST TECHNIQUE: Contiguous axial images were obtained from the base of the skull through the vertex without intravenous contrast. COMPARISON:  None. FINDINGS: Generalized atrophy. Normal ventricular morphology. No midline shift or mass effect. Small vessel chronic ischemic changes of deep cerebral white matter. No intracranial hemorrhage, mass lesion, or acute infarction. Visualized paranasal sinuses and mastoid air cells clear. Bones unremarkable. Atherosclerotic calcifications at the carotid siphons. IMPRESSION: Atrophy with small vessel chronic ischemic changes of deep cerebral white matter. No acute intracranial abnormalities. Electronically Signed   By: Lavonia Dana M.D.   On: 12/09/2015 15:15   Mr Brain Wo Contrast  12/09/2015  CLINICAL DATA:  Intermittent dizzy spells, most recent 1 earlier today. EXAM: MRI HEAD WITHOUT CONTRAST TECHNIQUE: Multiplanar, multiecho pulse sequences of the brain and surrounding structures were obtained without intravenous contrast. COMPARISON:  CT same day.  MRI 06/10/2014. FINDINGS: Diffusion imaging shows a punctate acute infarction in the periphery of the inferior cerebellum on the left. There is a 4 mm acute infarction at the right posterior temporal cortex. Both of these are consistent with micro embolic infarctions in the posterior circulation. No sign of swelling or hemorrhage. There chronic small-vessel ischemic changes throughout the pons. Cerebral hemispheres elsewhere show chronic small-vessel ischemic changes within the deep and subcortical white matter. No large vessel territory infarction. No mass lesion, hemorrhage, hydrocephalus or extra-axial collection. No pituitary mass. No significant sinus disease. IMPRESSION: Punctate acute infarction in the peripheral left cerebellum and in the peripheral left  posterior temporal region, consistent with micro embolic infarctions in the posterior circulation. No large infarction, swelling or hemorrhage. Chronic small vessel ischemic changes elsewhere throughout the brain, similar to the study of 2015. Electronically Signed   By: Nelson Chimes M.D.   On: 12/09/2015 17:02      Fredia Sorrow, MD 12/09/15 1741  The patient will be evaluated by the neuro hospitalist. They feel that the areas of stroke are real but very small not sure if they would be  enough to cause vertigo. They do recommend admission. Discussed with the hospitalist team patient followed by [facility] will be admitted by the [facility] hospitalist. Patient is allergic to aspirin and cannot take aspirin. It causes significant wheezing. So held off on giving aspirin.  Fredia Sorrow, MD 12/09/15 1816

## 2015-12-09 NOTE — H&P (Signed)
[facility] History and Physical  [name] [name] [id number] DOB: [birth year] DOA: 12/09/2015   PCP: Hollace Kinnier, DO   Chief Complaint: Dizziness  HPI: [name] [name] is a 82 y.o. woman with a history of HTN, asthma, and anxiety who presents for evaluation of dizziness, which began on Wednesday.  The patient had an episode of dizziness at home that lasted for several seconds, and did not have any associated symptoms except for slight loss of balance (but she did not fall, hit her head, or lose consciousness).  She denies associated headache, tinnitus, palpitations.  She symptoms were self-limited, though she reports feeling "not like herself" afterwards.  She denies localized/focal weakness.  She rested for the rest of the day and thought that she was back to her normal self until this afternoon, when she had another episode of dizziness.  She notified her goddaughter, who is at bedside, and the patient was brought to the ED by private car for further evaluation.  She denies recent cough, cold, congestion.  No recent antibiotics.  No lower urinary tract symptoms.  No fever.  No blood in her urine or stool.  Bowel habits have been stable.  She takes a flu shot annually.  ED evaluation concerning for two small acute strokes on MRI.  She has not received aspirin due to allergy (wheezing).  Neurology consult pending.  Hospitalist asked to admit.  Review of Systems: 12 symptoms reviewed and negative except as stated in HPI.  Past Medical History  Diagnosis Date  . Benign essential hypertension   . Osteoarthritis   . GERD (gastroesophageal reflux disease)   . Seasonal allergies   . Asthma   . Glaucoma   . PVD (peripheral vascular disease) ([place])   . Anxiety    Past Surgical History  Procedure Laterality Date  . Abdominal hysterectomy  1981  . 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  She is a widow.  She had two biological children and several "adopted".  Allergies  Allergen Reactions  . Aspirin     sweats  . Codeine   . Iodine     Pt is not aware of this allergy, does not recall much info....//a.c.  . Prednisone     sweats  . Sulfa Antibiotics     Doesn't remember    Family History  Problem Relation Age of Onset  . Stroke Father   Father smoked a pipe. Her mother had heart disease and diabetes. Sister died of complications related to a bowel obstruction.  Prior to Admission medications   Medication Sig Start Date End Date Taking? Authorizing Provider  acetaminophen (TYLENOL) 500 MG tablet Take 1 tablet (500 mg total) by mouth every 6 (six) hours as needed for moderate pain. 03/20/15  Yes Tiffany L Reed, DO  albuterol (PROVENTIL HFA;VENTOLIN HFA) 108 (90 BASE) MCG/ACT inhaler Inhale 2 puffs into the lungs every 6 (six) hours as needed for wheezing.   Yes Historical Provider, MD  alum hydroxide-mag trisilicate (GAVISCON) [id number] MG CHEW chewable tablet Chew 1 tablet by mouth 3 (three) times daily as needed for flatulence (bowel spasms). 08/24/15  Yes Tiffany L Reed, DO  amLODipine (NORVASC) 5 MG tablet TAKE 1 TABLET (5 MG TOTAL) BY MOUTH DAILY. 08/01/15  Yes Tiffany L Reed, DO  bimatoprost (LUMIGAN)  0.01 % SOLN Place 1 drop into both eyes at bedtime.   Yes Historical Provider, MD  brinzolamide (AZOPT) 1 % ophthalmic suspension Place 1 drop into both eyes 3 (three) times daily.   Yes Historical Provider, MD  Calcium Carbonate-Vitamin D (CALTRATE 600+D) 600-400 MG-UNIT per tablet Take 2 tablets by mouth daily.   Yes Historical Provider, MD  Coenzyme Q10 (COQ10) 50 MG CAPS Take one tablet by mouth once daily   Yes Historical Provider, MD  fluticasone (FLONASE) 50 MCG/ACT nasal spray Place 1 spray into the nose daily.   Yes Historical Provider, MD  hydrocortisone (ANUSOL-HC) 2.5 % rectal cream  Place 1 application rectally 2 (two) times daily. 03/20/15  Yes Tiffany L Reed, DO  lansoprazole (PREVACID) 3 mg/ml SUSP oral suspension Take 10 mLs (30 mg total) by mouth daily before supper. 12/01/14  Yes Tiffany L Reed, DO  loteprednol (LOTEMAX) 0.5 % ophthalmic suspension Place 1 drop into the left eye daily.   Yes Historical Provider, MD  Multiple Vitamins-Minerals (SENIOR MULTIVITAMIN PLUS) TABS Take by mouth.   Yes Historical Provider, MD  Omega-3 Fatty Acids (FISH OIL) 1200 MG CPDR Take one tablet by mouth once daily   Yes Historical Provider, MD  RESTASIS 0.05 % ophthalmic emulsion Place 1 drop into both eyes 2 (two) times daily.  06/02/14  Yes Historical Provider, MD  risperiDONE (RISPERDAL) 0.5 MG tablet TAKE 1 TABLET BY MOUTH TWICE A DAY 09/29/15  Yes Tiffany L Reed, DO  senna-docusate (SENOKOT-S) 8.6-50 MG tablet Take 1 tablet by mouth daily. 10/27/15  Yes Tiffany L Reed, DO  SYMBICORT 80-4.5 MCG/ACT inhaler Place 2 puffs into alternate nostrils 2 (two) times daily. 10/17/14  Yes Historical Provider, MD  vitamin B-12 (CYANOCOBALAMIN) 1000 MCG tablet Take 1,000 mcg by mouth daily.   Yes Historical Provider, MD  vitamin C (ASCORBIC ACID) 500 MG tablet Take 500 mg by mouth daily.   Yes Historical Provider, MD  nitroGLYCERIN (NITROSTAT) 0.4 MG SL tablet Place 1 tablet (0.4 mg total) under the tongue every 5 (five) minutes as needed for chest pain. 11/17/14   Debby Freiberg, MD   Physical Exam: Filed Vitals:   12/09/15 1545 12/09/15 1600 12/09/15 1730 12/09/15 1745  BP: 156/62 136/55 160/97 180/86  Pulse: 58 54 62 65  Temp:      TempSrc:      Resp: 14 16 19    SpO2: 100% 99% 100% 100%     General:  Awake and alert.  Oriented to person, place, time and situation.  NAD.  Eyes: PERRL bilaterally, conjunctiva appear irritated.  ENT: Moist mucous membranes.  No nasal drainage.  Neck: Supple.  No carotid bruit.   Cardiovascular: NR/RR.  She has a split S3.  No LE edema.  Respiratory:  CTA bilaterally.  Abdomen: Soft/NT/ND.  Bowel sounds are present.  No guarding.  Skin: Warm and dry.  Musculoskeletal: Moves all four extremities spontaneously.  Psychiatric: Normal affect.  Neurologic: CN grossly intact.  Strenght and coordination including fine motor intact and symmetric bilaterally.  No pronator drift.  Labs on Admission:  Basic Metabolic Panel:  Recent Labs Lab 12/09/15 1414 12/09/15 1421  NA 142 142  K 3.9 3.9  CL 108 105  CO2 25  --   GLUCOSE 122* 122*  BUN 20 22*  CREATININE 0.80 0.90  CALCIUM 9.3  --    Liver Function Tests:  Recent Labs Lab 12/09/15 1414  AST 22  ALT 18  ALKPHOS 63  BILITOT 0.9  PROT 6.6  ALBUMIN 3.7   CBC:  Recent Labs Lab 12/09/15 1414 12/09/15 1421  WBC 5.3  --   NEUTROABS 3.1  --   HGB 11.5* 12.6  HCT 33.7* 37.0  MCV 90.1  --   PLT 211  --     CBG:  Recent Labs Lab 12/09/15 1540  GLUCAP 103*    Radiological Exams on Admission: Ct Head Wo Contrast  12/09/2015  CLINICAL DATA:  Intermittent dizzy spells, one on Wednesday and another today, some shortness of breath, history asthma, peripheral vascular disease, GERD EXAM: CT HEAD WITHOUT CONTRAST TECHNIQUE: Contiguous axial images were obtained from the base of the skull through the vertex without intravenous contrast. COMPARISON:  None. FINDINGS: Generalized atrophy. Normal ventricular morphology. No midline shift or mass effect. Small vessel chronic ischemic changes of deep cerebral white matter. No intracranial hemorrhage, mass lesion, or acute infarction. Visualized paranasal sinuses and mastoid air cells clear. Bones unremarkable. Atherosclerotic calcifications at the carotid siphons. IMPRESSION: Atrophy with small vessel chronic ischemic changes of deep cerebral white matter. No acute intracranial abnormalities. Electronically Signed   By: Lavonia Dana M.D.   On: 12/09/2015 15:15   Mr Brain Wo Contrast  12/09/2015  CLINICAL DATA:  Intermittent dizzy  spells, most recent 1 earlier today. EXAM: MRI HEAD WITHOUT CONTRAST TECHNIQUE: Multiplanar, multiecho pulse sequences of the brain and surrounding structures were obtained without intravenous contrast. COMPARISON:  CT same day.  MRI 06/10/2014. FINDINGS: Diffusion imaging shows a punctate acute infarction in the periphery of the inferior cerebellum on the left. There is a 4 mm acute infarction at the right posterior temporal cortex. Both of these are consistent with micro embolic infarctions in the posterior circulation. No sign of swelling or hemorrhage. There chronic small-vessel ischemic changes throughout the pons. Cerebral hemispheres elsewhere show chronic small-vessel ischemic changes within the deep and subcortical white matter. No large vessel territory infarction. No mass lesion, hemorrhage, hydrocephalus or extra-axial collection. No pituitary mass. No significant sinus disease. IMPRESSION: Punctate acute infarction in the peripheral left cerebellum and in the peripheral left posterior temporal region, consistent with micro embolic infarctions in the posterior circulation. No large infarction, swelling or hemorrhage. Chronic small vessel ischemic changes elsewhere throughout the brain, similar to the study of 2015. Electronically Signed   By: Nelson Chimes M.D.   On: 12/09/2015 17:02    EKG: Independently reviewed. RBBB is not new.  NSR.  No acute ST segment changes.  Assessment/Plan Active Problems:   Glaucoma   Gastroesophageal reflux disease without esophagitis   Acute CVA (cerebrovascular accident) ([place])   Dizziness   Accelerated hypertension   CVA (cerebral infarction)   Admit to telemetry  Acute CVA, possibly embolic, possibly contributing to chief complaint of dizziness --Neurology consult pending --Carotid ultrasound, complete echo, A1c, lipid panel --PT/OT/Speech evaluations --Scree for arrhythmia  Accelerated HTN --Continue home meds.  Consider adding ACE-I at discharge  if blood pressure remains elevated.  Will tolerate blood pressure for now due to acute CVA.  Glaucoma --Continue home eyedrops  Code Status: FULL Family Communication: Goddaughter at bedside Disposition Plan: Expect her to be here two midnights  Time spent: 55 minutes  [employer] [facility]  12/09/2015, 7:20 PM

## 2015-12-09 NOTE — ED Notes (Signed)
Pt here for intermittent dizzy spells. sts she had one Wednesday and one today not lasting very long. sts some SOB. Denies any weakness, numbness.

## 2015-12-09 NOTE — ED Notes (Signed)
Pt. And pt.s friend updated on plan of care of pt. By Dr. Venita Sheffield

## 2015-12-10 ENCOUNTER — Inpatient Hospital Stay (HOSPITAL_COMMUNITY): Payer: Medicare Other

## 2015-12-10 DIAGNOSIS — I6789 Other cerebrovascular disease: Secondary | ICD-10-CM

## 2015-12-10 DIAGNOSIS — I6340 Cerebral infarction due to embolism of unspecified cerebral artery: Secondary | ICD-10-CM | POA: Insufficient documentation

## 2015-12-10 LAB — LIPID PANEL
Cholesterol: 173 mg/dL (ref 0–200)
HDL: 86 mg/dL (ref >40)
LDL CALC: 80 mg/dL (ref 0–99)
Total CHOL/HDL Ratio: 2 RATIO
Triglycerides: 34 mg/dL (ref <150)
VLDL: 7 mg/dL (ref 0–40)

## 2015-12-10 MED ORDER — GADOBENATE DIMEGLUMINE 529 MG/ML IV SOLN
15.0000 mL | Freq: Once | INTRAVENOUS | Status: AC | PRN
  Administered 2015-12-10: 12 mL via INTRAVENOUS

## 2015-12-10 MED ORDER — ATORVASTATIN CALCIUM 10 MG PO TABS
10.0000 mg | ORAL_TABLET | Freq: Every day | ORAL | Status: DC
  Administered 2015-12-10: 10 mg via ORAL
  Filled 2015-12-10 (×2): qty 1

## 2015-12-10 NOTE — Progress Notes (Signed)
[facility] PROGRESS NOTE  JAINABA DIGANGI [id number] DOB: 1923-10-23 DOA: 12/09/2015 PCP: Hollace Kinnier, DO  Assessment/Plan:  CVA (cerebral infarction) - Currently undergoing stroke workup - We'll hold amlodipine  Active Problems:   Glaucoma - Continue current medication regimen    Gastroesophageal reflux disease without esophagitis - Stable  Hypertension -We'll allow permissive hypertension and discontinue amlodipine  Code Status: Full Family Communication: Discussed directly with patient Disposition Plan: Pending final recommendations from neurologist   Consultants:  Neurology  Procedures:  Please refer to EMR  Antibiotics:  None  HPI/Subjective: Patient has no new complaints. No acute issues reported overnight  Objective: Filed Vitals:   12/10/15 0929 12/10/15 1458  BP: 153/49 145/56  Pulse: 59 56  Temp: 98.3 F (36.8 C) 97.4 F (36.3 C)  Resp: 18 18   No intake or output data in the 24 hours ending 12/10/15 1643 Filed Weights   12/09/15 1957  Weight: 57.97 kg (127 lb 12.8 oz)    Exam:   General:  Patient in no acute distress, alert and awake  Cardiovascular: Regular rate and rhythm, no murmurs or rubs  Respiratory: No increased work of breathing, no wheezes  Abdomen: Soft, nondistended, nontender  Musculoskeletal: No cyanosis limited exam   Data Reviewed: Basic Metabolic Panel:  Recent Labs Lab 12/09/15 1414 12/09/15 1421  NA 142 142  K 3.9 3.9  CL 108 105  CO2 25  --   GLUCOSE 122* 122*  BUN 20 22*  CREATININE 0.80 0.90  CALCIUM 9.3  --    Liver Function Tests:  Recent Labs Lab 12/09/15 1414  AST 22  ALT 18  ALKPHOS 63  BILITOT 0.9  PROT 6.6  ALBUMIN 3.7   No results for input(s): LIPASE, AMYLASE in the last 168 hours. No results for input(s): AMMONIA in the last 168 hours. CBC:  Recent Labs Lab 12/09/15 1414 12/09/15 1421  WBC 5.3  --   NEUTROABS 3.1  --   HGB 11.5* 12.6  HCT 33.7* 37.0  MCV  90.1  --   PLT 211  --    Cardiac Enzymes: No results for input(s): CKTOTAL, CKMB, CKMBINDEX, TROPONINI in the last 168 hours. BNP (last 3 results) No results for input(s): BNP in the last 8760 hours.  ProBNP (last 3 results) No results for input(s): PROBNP in the last 8760 hours.  CBG:  Recent Labs Lab 12/09/15 1540  GLUCAP 103*    No results found for this or any previous visit (from the past 240 hour(s)).   Studies: Ct Head Wo Contrast  12/09/2015  CLINICAL DATA:  Intermittent dizzy spells, one on Wednesday and another today, some shortness of breath, history asthma, peripheral vascular disease, GERD EXAM: CT HEAD WITHOUT CONTRAST TECHNIQUE: Contiguous axial images were obtained from the base of the skull through the vertex without intravenous contrast. COMPARISON:  None. FINDINGS: Generalized atrophy. Normal ventricular morphology. No midline shift or mass effect. Small vessel chronic ischemic changes of deep cerebral white matter. No intracranial hemorrhage, mass lesion, or acute infarction. Visualized paranasal sinuses and mastoid air cells clear. Bones unremarkable. Atherosclerotic calcifications at the carotid siphons. IMPRESSION: Atrophy with small vessel chronic ischemic changes of deep cerebral white matter. No acute intracranial abnormalities. Electronically Signed   By: Lavonia Dana M.D.   On: 12/09/2015 15:15   Mr Jodene Nam Head Wo Contrast  12/10/2015  CLINICAL DATA:  Initial evaluation for stroke. EXAM: MRA NECK WITHOUT AND WITH CONTRAST MRA HEAD WITHOUT CONTRAST TECHNIQUE: Multiplanar and multiecho pulse  sequences of the neck were obtained without and with intravenous contrast. Angiographic images of the neck were obtained using MRA technique without and with intravenous contrast.; Angiographic images of the Circle of Willis were obtained using MRA technique without intravenous contrast. CONTRAST:  70mL MULTIHANCE GADOBENATE DIMEGLUMINE 529 MG/ML IV SOLN COMPARISON:  Prior MRI from  12/09/2015. FINDINGS: MRA NECK FINDINGS Visualized aortic arch is of normal caliber with normal branch pattern. Visualized subclavian arteries widely patent. No high-grade stenosis seen at the origin of the great vessels. Right common carotid artery tortuous proximally but widely patent to the bifurcation. No significant atheromatous plaque about the bifurcation. Right ICA widely patent from the bifurcation to the skullbase. Right ICA is mildly tortuous. No high-grade stenosis, dissection, or vascular occlusion within the right carotid artery system. Left common carotid artery patent from its origin to the bifurcation. Left common carotid artery mildly tortuous proximally. No significant atheromatous plaque appreciated about the left carotid bifurcation. Left ICA patent from the bifurcation to the skullbase. Left ICA mildly tortuous. No high-grade stenosis, dissection, or vascular occlusion within the left carotid artery system. Vertebral arteries patent from the origin to the skullbase without evidence for dissection, occlusion, or stenosis. Vertebral arteries are tortuous bilaterally. Please note that the origin of the vertebral arteries not well evaluated on this exam. MRA HEAD FINDINGS ANTERIOR CIRCULATION: Visualized distal cervical segments of the internal carotid arteries are mildly tortuous but widely patent with antegrade flow. Petrous, cavernous, and supraclinoid segments patent without high-grade stenosis or occlusion. Mild atheromatous irregularity within the cavernous/ supraclinoid ICAs bilaterally. A1 segments patent. Anterior communicating artery normal. Anterior cerebral arteries well opacified to their distal aspects. M1 segments patent without stenosis or occlusion. MCA bifurcations within normal limits. No proximal M2 branch occlusion. Visualized distal MCA branches symmetric and well opacified bilaterally. POSTERIOR CIRCULATION: Vertebral arteries are patent to the vertebrobasilar junction. Right  vertebral artery is dominant. Posterior inferior cerebral arteries not well evaluated on this exam. Mild attenuation of the proximal basilar artery favored to be related to motion on this study. Basilar artery otherwise well opacified to its distal aspect. Superior cerebral arteries patent bilaterally. Both posterior cerebral arteries arise from the basilar artery and are well opacified to their distal aspects. No aneurysm or vascular malformation within the intracranial circulation. IMPRESSION: MRA HEAD IMPRESSION: Negative intracranial MRA of the brain. No large or proximal arterial branch occlusion. No high-grade or correctable stenosis. MRA NECK IMPRESSION: 1. No flow limiting stenosis or other acute abnormality within the major arterial vasculature of the neck. 2. Tortuosity of the arterial vasculature of the neck, suggesting chronic underlying hypertension. Electronically Signed   By: Jeannine Boga M.D.   On: 12/10/2015 06:06   Mr Angiogram Neck W Wo Contrast  12/10/2015  CLINICAL DATA:  Initial evaluation for stroke. EXAM: MRA NECK WITHOUT AND WITH CONTRAST MRA HEAD WITHOUT CONTRAST TECHNIQUE: Multiplanar and multiecho pulse sequences of the neck were obtained without and with intravenous contrast. Angiographic images of the neck were obtained using MRA technique without and with intravenous contrast.; Angiographic images of the Circle of Willis were obtained using MRA technique without intravenous contrast. CONTRAST:  31mL MULTIHANCE GADOBENATE DIMEGLUMINE 529 MG/ML IV SOLN COMPARISON:  Prior MRI from 12/09/2015. FINDINGS: MRA NECK FINDINGS Visualized aortic arch is of normal caliber with normal branch pattern. Visualized subclavian arteries widely patent. No high-grade stenosis seen at the origin of the great vessels. Right common carotid artery tortuous proximally but widely patent to the bifurcation. No significant atheromatous plaque  about the bifurcation. Right ICA widely patent from the  bifurcation to the skullbase. Right ICA is mildly tortuous. No high-grade stenosis, dissection, or vascular occlusion within the right carotid artery system. Left common carotid artery patent from its origin to the bifurcation. Left common carotid artery mildly tortuous proximally. No significant atheromatous plaque appreciated about the left carotid bifurcation. Left ICA patent from the bifurcation to the skullbase. Left ICA mildly tortuous. No high-grade stenosis, dissection, or vascular occlusion within the left carotid artery system. Vertebral arteries patent from the origin to the skullbase without evidence for dissection, occlusion, or stenosis. Vertebral arteries are tortuous bilaterally. Please note that the origin of the vertebral arteries not well evaluated on this exam. MRA HEAD FINDINGS ANTERIOR CIRCULATION: Visualized distal cervical segments of the internal carotid arteries are mildly tortuous but widely patent with antegrade flow. Petrous, cavernous, and supraclinoid segments patent without high-grade stenosis or occlusion. Mild atheromatous irregularity within the cavernous/ supraclinoid ICAs bilaterally. A1 segments patent. Anterior communicating artery normal. Anterior cerebral arteries well opacified to their distal aspects. M1 segments patent without stenosis or occlusion. MCA bifurcations within normal limits. No proximal M2 branch occlusion. Visualized distal MCA branches symmetric and well opacified bilaterally. POSTERIOR CIRCULATION: Vertebral arteries are patent to the vertebrobasilar junction. Right vertebral artery is dominant. Posterior inferior cerebral arteries not well evaluated on this exam. Mild attenuation of the proximal basilar artery favored to be related to motion on this study. Basilar artery otherwise well opacified to its distal aspect. Superior cerebral arteries patent bilaterally. Both posterior cerebral arteries arise from the basilar artery and are well opacified to their  distal aspects. No aneurysm or vascular malformation within the intracranial circulation. IMPRESSION: MRA HEAD IMPRESSION: Negative intracranial MRA of the brain. No large or proximal arterial branch occlusion. No high-grade or correctable stenosis. MRA NECK IMPRESSION: 1. No flow limiting stenosis or other acute abnormality within the major arterial vasculature of the neck. 2. Tortuosity of the arterial vasculature of the neck, suggesting chronic underlying hypertension. Electronically Signed   By: Jeannine Boga M.D.   On: 12/10/2015 06:06   Mr Brain Wo Contrast  12/09/2015  CLINICAL DATA:  Intermittent dizzy spells, most recent 1 earlier today. EXAM: MRI HEAD WITHOUT CONTRAST TECHNIQUE: Multiplanar, multiecho pulse sequences of the brain and surrounding structures were obtained without intravenous contrast. COMPARISON:  CT same day.  MRI 06/10/2014. FINDINGS: Diffusion imaging shows a punctate acute infarction in the periphery of the inferior cerebellum on the left. There is a 4 mm acute infarction at the right posterior temporal cortex. Both of these are consistent with micro embolic infarctions in the posterior circulation. No sign of swelling or hemorrhage. There chronic small-vessel ischemic changes throughout the pons. Cerebral hemispheres elsewhere show chronic small-vessel ischemic changes within the deep and subcortical white matter. No large vessel territory infarction. No mass lesion, hemorrhage, hydrocephalus or extra-axial collection. No pituitary mass. No significant sinus disease. IMPRESSION: Punctate acute infarction in the peripheral left cerebellum and in the peripheral left posterior temporal region, consistent with micro embolic infarctions in the posterior circulation. No large infarction, swelling or hemorrhage. Chronic small vessel ischemic changes elsewhere throughout the brain, similar to the study of 2015. Electronically Signed   By: Nelson Chimes M.D.   On: 12/09/2015 17:02     Scheduled Meds: . amLODipine  5 mg Oral Daily  . atorvastatin  10 mg Oral q1800  . brinzolamide  1 drop Both Eyes TID  . clopidogrel  75 mg Oral Daily  .  cycloSPORINE  1 drop Both Eyes BID  . fluticasone  1 spray Each Nare Daily  . hydrocortisone  1 application Rectal BID  . latanoprost  1 drop Both Eyes QHS  . loteprednol  1 drop Left Eye Daily  . mometasone-formoterol  2 puff Inhalation BID  . pantoprazole  40 mg Oral QAC supper  . risperiDONE  0.5 mg Oral BID  . senna-docusate  1 tablet Oral Daily   Continuous Infusions:   Time spent: > 35 minutes   Velvet Bathe  [facility] Pager [phone] If 7PM-7AM, please contact night-coverage at [email or web address], password [facility] 12/10/2015, 4:43 PM  LOS: 1 day

## 2015-12-10 NOTE — Evaluation (Signed)
Physical Therapy Evaluation Patient Details Name: [name] [name] MRN: [id number] DOB: [birth year] Today's Date: 12/10/2015   History of Present Illness  Pt admitted with 2 punctate infarcts in the posterior circulation.Marland Kitchen  PMH sinificant for PVD, HTN, OA, anxiety  Clinical Impression  Pt presents to PT with mild limitations in mobility and per pt report not quite back to her baseline level of function.  Pt will benefit  From PT to return to baseline level of function.      Follow Up Recommendations Home health PT;Supervision - Intermittent    Equipment Recommendations  None recommended by PT    Recommendations for Other Services       Precautions / Restrictions Precautions Precautions: Fall      Mobility  Bed Mobility Overal bed mobility: Independent                Transfers Overall transfer level: Needs assistance Equipment used: None Transfers: Sit to/from Stand Sit to Stand: Supervision         General transfer comment: pefomred several times during evaluation  Ambulation/Gait Ambulation/Gait assistance: Min guard Ambulation Distance (Feet): 150 Feet Assistive device: Straight cane Gait Pattern/deviations: Step-through pattern;Decreased stride length        Stairs            Wheelchair Mobility    Modified Rankin (Stroke Patients Only) Modified Rankin (Stroke Patients Only) Pre-Morbid Rankin Score: No symptoms Modified Rankin: No significant disability (Pt reports she does not quite feel like she is at baseline)     Balance Overall balance assessment: No apparent balance deficits (not formally assessed) (recommend DGI or other testing next session to further asses)                                           Pertinent Vitals/Pain Pain Assessment: No/denies pain    Home Living Family/patient expects to be discharged to:: Private residence Living Arrangements: Alone Available Help at Discharge: Family;Available  PRN/intermittently Type of Home: House Home Access: Stairs to enter Entrance Stairs-Rails: Right Entrance Stairs-Number of Steps: 3 Home Layout: One level Home Equipment: Cane - single point      Prior Function Level of Independence: Independent with assistive device(s)         Comments: Pt reports doing all basic ADLs and cooking.  Caren Macadam does grocery shopping and cleaning for her. She reports no fistory of falls.      Hand Dominance   Dominant Hand: Right    Extremity/Trunk Assessment   Upper Extremity Assessment: Overall WFL for tasks assessed           Lower Extremity Assessment: Generalized weakness      Cervical / Trunk Assessment: Kyphotic  Communication   Communication: No difficulties  Cognition Arousal/Alertness: Awake/alert Behavior During Therapy: WFL for tasks assessed/performed Overall Cognitive Status: Within Functional Limits for tasks assessed                      General Comments General comments (skin integrity, edema, etc.): Pt only wants to walk with her shoes on, reports her friends have fallen just wearing socks. No family present. pt stood at sink to brush her teeth with no balance issues.     Exercises        Assessment/Plan    PT Assessment Patient needs continued PT services  PT Diagnosis Abnormality of gait  PT Problem List Decreased strength;Decreased mobility;Decreased balance  PT Treatment Interventions Gait training;Stair training;Functional mobility training;Therapeutic activities;Balance training   PT Goals (Current goals can be found in the Care Plan section) Acute Rehab PT Goals Patient Stated Goal: to feel better on her feet and then go home PT Goal Formulation: With patient Time For Goal Achievement: 12/16/15 Potential to Achieve Goals: Good    Frequency Min 3X/week   Barriers to discharge        Co-evaluation               End of Session Equipment Utilized During Treatment: Gait  belt Activity Tolerance: Patient tolerated treatment well Patient left: in chair;with call bell/phone within reach Nurse Communication: Mobility status;Other (comment) (no chait alarm on since fall risk score was 9)         Time: [id number] PT Time Calculation (min) (ACUTE ONLY): 34 min   Charges:   PT Evaluation $PT Eval Moderate Complexity: 1 Procedure PT Treatments $Gait Training: 8-22 mins   PT G Codes:        Melvern Banker 12/10/2015, 11:04 AM  Lavonia Dana, PT  [phone] 12/10/2015

## 2015-12-10 NOTE — Progress Notes (Signed)
  Echocardiogram 2D Echocardiogram has been performed.  [name] [name] 12/10/2015, 1:38 PM

## 2015-12-10 NOTE — Progress Notes (Signed)
STROKE TEAM PROGRESS NOTE   HISTORY OF PRESENT ILLNESS SHAAKIRA [name] is a 82 y.o. female who has had 2 episodes of dizziness in the past week. The first one happened last Wednesday when she describes it as vertigo. It lasted for a few seconds and then resolves. She had a second today which lasted for slightly longer, however also resolved within less than a minute. Due to this, she sought care in the emergency department tonight where an MRI was performed which shows 2 punctate infarcts in the posterior circulation.  LKW: Wednesday tpa given?: no, symptoms resolved Premorbid modified rankin scale: 0   SUBJECTIVE (INTERVAL HISTORY) Her family is not at the bedside.  Overall she feels her condition is gradually improving. She has no complaints on ROS.  However, she is not the best historian   OBJECTIVE Temp:  [97.9 F (36.6 C)-98.4 F (36.9 C)] 98.3 F (36.8 C) (03/05 0929) Pulse Rate:  [54-69] 59 (03/05 0929) Cardiac Rhythm:  [-] Sinus bradycardia;Bundle branch block (03/05 0729) Resp:  [14-24] 18 (03/05 0929) BP: (111-185)/(34-97) 153/49 mmHg (03/05 0929) SpO2:  [94 %-100 %] 94 % (03/05 0929) Weight:  [57.97 kg (127 lb 12.8 oz)] 57.97 kg (127 lb 12.8 oz) (03/04 1957)  CBC:   Recent Labs Lab 12/09/15 1414 12/09/15 1421  WBC 5.3  --   NEUTROABS 3.1  --   HGB 11.5* 12.6  HCT 33.7* 37.0  MCV 90.1  --   PLT 211  --     Basic Metabolic Panel:   Recent Labs Lab 12/09/15 1414 12/09/15 1421  NA 142 142  K 3.9 3.9  CL 108 105  CO2 25  --   GLUCOSE 122* 122*  BUN 20 22*  CREATININE 0.80 0.90  CALCIUM 9.3  --     Lipid Panel:     Component Value Date/Time   CHOL 173 12/10/2015 0549   CHOL 187 08/17/2013 0836   TRIG 34 12/10/2015 0549   HDL 86 12/10/2015 0549   HDL 105 08/17/2013 0836   CHOLHDL 2.0 12/10/2015 0549   CHOLHDL 1.8 08/17/2013 0836   VLDL 7 12/10/2015 0549   LDLCALC 80 12/10/2015 0549   LDLCALC 73 08/17/2013 0836   HgbA1c:  Lab Results   Component Value Date   HGBA1C 5.5 07/17/2015   Urine Drug Screen: No results found for: LABOPIA, COCAINSCRNUR, LABBENZ, AMPHETMU, THCU, LABBARB    IMAGING  Ct Head Wo Contrast 12/09/2015   Atrophy with small vessel chronic ischemic changes of deep cerebral white matter. No acute intracranial abnormalities.    Mr Angiogram Neck W Wo Contrast 12/10/2015    MRA HEAD  Negative intracranial MRA of the brain. No large or proximal arterial branch occlusion. No high-grade or correctable stenosis.   MRA NECK   1. No flow limiting stenosis or other acute abnormality within the major arterial vasculature of the neck.  2. Tortuosity of the arterial vasculature of the neck, suggesting chronic underlying hypertension.    Mr Brain Wo Contrast 12/09/2015   Punctate acute infarction in the peripheral left cerebellum and in the peripheral left posterior temporal region, consistent with micro embolic infarctions in the posterior circulation. No large infarction, swelling or hemorrhage. Chronic small vessel ischemic changes elsewhere throughout the brain, similar to the study of 2015.    PHYSICAL EXAM Constitutional: Appears well-developed and well-nourished. She is wearing a wig Psych: Affect appropriate to situation HENT: NCAT, PERRL, sclera clear  Cardiovascular: Normal rate and regular rhythm.  Respiratory: Effort  normal and breath sounds normal to anterior ascultation GI: Soft. No distension. There is no tenderness.  Extrem:  Slight swelling in LEs   Neuro: Mental Status: Patient is awake, alert, oriented to person, place, month, year, and situation. Patient is able to give a clear and coherent history. No signs of aphasia or neglect Cranial Nerves: II: Visual Fields are full. Pupils are equal, round, and reactive to light.  III,IV, VI: EOMI without ptosis or diploplia.  V: Facial sensation is symmetric to temperature VII: Facial movement is symmetric.  VIII: hearing is intact  to voice X: Uvula elevates symmetrically XI: Shoulder shrug is symmetric. XII: tongue is midline without atrophy or fasciculations.  Motor: Tone is normal. Bulk is normal. 5/5 strength was present in all four extremities.  Sensory: Sensation is symmetric to light touch and temperature in the arms and legs. Cerebellar: FNF and HKS are intact bilaterally  ASSESSMENT/PLAN [name] is a 82 y.o. female with history of hypertension, asthma, peripheral vascular disease, and anxiety presenting with dizziness/vertigo.. She did not receive IV t-PA due to resolution of deficits.  Stroke:  Dominant infarcts possibly embolic from an unknown source.  Resultant  dizziness  MRI Punctate acute infarction in the peripheral left cerebellum and in the peripheral left posterior temporal region.  MRA - negative  Carotid Doppler - refer to MRA of the neck  2D Echo - pending  LDL - 80  HgbA1c pending  VTE prophylaxis - SCDs Diet Heart Room service appropriate?: Yes; Fluid consistency:: Thin  No antithrombotic prior to admission, now on clopidogrel 75 mg daily  Patient counseled to be compliant with her antithrombotic medications  Ongoing aggressive stroke risk factor management  Therapy recommendations:  Home health physical therapy recommended  Disposition: Pending  Hypertension  Occasional elevated blood pressures. (Amlodipine 5 mg daily now and prior to admission)  Permissive hypertension (OK if < 220/120) but gradually normalize in 5-7 days  Hyperlipidemia  Home meds:  No lipid lowering medications prior to admission.  LDL 80, goal < 70  Add low-dose Lipitor 10 mg daily  Continue statin at discharge   Other Stroke Risk Factors  Advanced age  Family hx stroke (father)   Other Active Problems  Mildly elevated BUN  Mildly elevated glucose levels  Hospital day # 1  ATTENDING NOTE: Patient was seen and examined by me personally. Documentation reflects  findings. The laboratory and radiographic studies reviewed by me. ROS completed by me personally and pertinent positives fully documented Condition: Stable   Assessment and plan completed by me personally and fully documented above. Plans/Recommendations include:     Stroke work-up ongoing  Will follow  SIGNED BY: Dr. Elissa Hefty      To contact Stroke Continuity provider, please refer to [email or web address] After hours, contact General Neurology

## 2015-12-11 DIAGNOSIS — I639 Cerebral infarction, unspecified: Principal | ICD-10-CM

## 2015-12-11 LAB — HEMOGLOBIN A1C
Hgb A1c MFr Bld: 5.7 % — ABNORMAL HIGH (ref 4.8–5.6)
MEAN PLASMA GLUCOSE: 117 mg/dL

## 2015-12-11 MED ORDER — ATORVASTATIN CALCIUM 10 MG PO TABS: 10.0000 mg | ORAL_TABLET | Freq: Every day | ORAL | Status: DC

## 2015-12-11 MED ORDER — CLOPIDOGREL BISULFATE 75 MG PO TABS: 75.0000 mg | ORAL_TABLET | Freq: Every day | ORAL | Status: DC

## 2015-12-11 NOTE — Care Management Note (Signed)
Case Management Note  Patient Details  Name: [name] [name] MRN: [id number] Date of Birth: [birth year]  Subjective/Objective:                    Action/Plan: Patient was admitted with CVA. Will follow for discharge needs pending PT/OT evals and physician orders.  Expected Discharge Date:                  Expected Discharge Plan:     In-House Referral:     Discharge planning Services     Post Acute Care Choice:    Choice offered to:     DME Arranged:    DME Agency:     HH Arranged:    HH Agency:     Status of Service:  In process, will continue to follow  Medicare Important Message Given:    Date Medicare IM Given:    Medicare IM give by:    Date Additional Medicare IM Given:    Additional Medicare Important Message give by:     If discussed at [place] of Stay Meetings, dates discussed:    Additional CommentsRolm Baptise, RN 12/11/2015, 11:02 AM [phone]

## 2015-12-11 NOTE — Progress Notes (Signed)
Patient is discharged from room 5C15 at this time. Alert and in stable condition. IV site d/c'd as well as tele. Instructions read to patient with understanding verbalized. Left unit via wheelchair with all belongings and daughter at side.

## 2015-12-11 NOTE — Evaluation (Signed)
Speech Language Pathology Evaluation Patient Details Name: SHIQUITA HOUSEHOLDER MRN: [id number] DOB: [birth year] Today's Date: 12/11/2015 Time: [id number] SLP Time Calculation (min) (ACUTE ONLY): 10 min  Problem List:  Patient Active Problem List   Diagnosis Date Noted  . Cerebral infarction due to unspecified mechanism   . Acute CVA (cerebrovascular accident) ([place]) 12/09/2015  . Dizziness 12/09/2015  . Accelerated hypertension 12/09/2015  . CVA (cerebral infarction) 12/09/2015  . Asthma, mild intermittent 04/04/2014  . Gastroesophageal reflux disease without esophagitis 04/04/2014  . Neuropathy of both feet ([place]) 04/04/2014  . Right ankle swelling 01/03/2014  . Diuretic-induced hypokalemia 01/03/2014  . Olfactory hallucinations 11/23/2013  . SOB (shortness of breath) 10/26/2013  . Edema extremities 10/26/2013  . Paranoid type delusional disorder ([place]) 08/20/2013  . Benign essential hypertension   . Osteoarthritis   . Seasonal allergies   . Asthma   . Glaucoma   . PVD (peripheral vascular disease) ([place])   . Anxiety    Past Medical History:  Past Medical History  Diagnosis Date  . Benign essential hypertension   . Osteoarthritis   . GERD (gastroesophageal reflux disease)   . Seasonal allergies   . Asthma   . Glaucoma   . PVD (peripheral vascular disease) ([place])   . Anxiety    Past Surgical History:  Past Surgical History  Procedure Laterality Date  . Abdominal hysterectomy  1981  . Tonsillectomy  1953   HPI:  Pt admitted with dizziness, two punctate infarcts in the posterior circulation.Marland Kitchen PMH sinificant for PVD, HTN, OA, anxiety.    Assessment / Plan / Recommendation Clinical Impression  Cognitive-linguistic evaluation complete revealing WNL function. No SLP f/u indicated.     SLP Assessment  Patient does not need any further Speech Lanaguage Pathology Services    Follow Up Recommendations  None          SLP Evaluation Prior Functioning  Cognitive/Linguistic  Baseline: Within functional limits Type of Home: House   Cognition  Overall Cognitive Status: Within Functional Limits for tasks assessed    Comprehension  Auditory Comprehension Overall Auditory Comprehension: Appears within functional limits for tasks assessed Visual Recognition/Discrimination Discrimination: Within Function Limits Reading Comprehension Reading Status: Not tested    Expression Expression Primary Mode of Expression: Verbal Verbal Expression Overall Verbal Expression: Appears within functional limits for tasks assessed   Oral / Motor  Oral Motor/Sensory Function Overall Oral Motor/Sensory Function: Within functional limits Motor Speech Overall Motor Speech: Appears within functional limits for tasks assessed   GO            Gabriel Rainwater MA, CCC-SLP [phone]         Carmen Vallecillo Meryl 12/11/2015, 11:08 AM

## 2015-12-11 NOTE — Progress Notes (Signed)
STROKE TEAM PROGRESS NOTE   SUBJECTIVE (INTERVAL HISTORY) No family present. No new complaints.   OBJECTIVE Temp:  [97.4 F (36.3 C)-98.5 F (36.9 C)] 98.5 F (36.9 C) (03/06 0942) Pulse Rate:  [51-99] 53 (03/06 0942) Cardiac Rhythm:  [-] Normal sinus rhythm;Bundle branch block;Heart block (03/06 0700) Resp:  [16-18] 18 (03/06 0942) BP: (124-164)/(56-67) 164/64 mmHg (03/06 0942) SpO2:  [93 %-100 %] 100 % (03/06 0942)  CBC:   Recent Labs Lab 12/09/15 1414 12/09/15 1421  WBC 5.3  --   NEUTROABS 3.1  --   HGB 11.5* 12.6  HCT 33.7* 37.0  MCV 90.1  --   PLT 211  --     Basic Metabolic Panel:   Recent Labs Lab 12/09/15 1414 12/09/15 1421  NA 142 142  K 3.9 3.9  CL 108 105  CO2 25  --   GLUCOSE 122* 122*  BUN 20 22*  CREATININE 0.80 0.90  CALCIUM 9.3  --     Lipid Panel:     Component Value Date/Time   CHOL 173 12/10/2015 0549   CHOL 187 08/17/2013 0836   TRIG 34 12/10/2015 0549   HDL 86 12/10/2015 0549   HDL 105 08/17/2013 0836   CHOLHDL 2.0 12/10/2015 0549   CHOLHDL 1.8 08/17/2013 0836   VLDL 7 12/10/2015 0549   LDLCALC 80 12/10/2015 0549   LDLCALC 73 08/17/2013 0836   HgbA1c:  Lab Results  Component Value Date   HGBA1C 5.7* 12/10/2015   Urine Drug Screen: No results found for: LABOPIA, COCAINSCRNUR, LABBENZ, AMPHETMU, THCU, LABBARB    IMAGING  Ct Head Wo Contrast 12/09/2015   Atrophy with small vessel chronic ischemic changes of deep cerebral white matter. No acute intracranial abnormalities.  MRA HEAD  12/10/2015   Negative intracranial MRA of the brain. No large or proximal arterial branch occlusion. No high-grade or correctable stenosis.   MRA NECK   12/10/2015   1. No flow limiting stenosis or other acute abnormality within the major arterial vasculature of the neck.  2. Tortuosity of the arterial vasculature of the neck, suggesting chronic underlying hypertension.   Mr Brain Wo Contrast 12/09/2015   Punctate acute infarction in the  peripheral left cerebellum and in the peripheral left posterior temporal region, consistent with micro embolic infarctions in the posterior circulation. No large infarction, swelling or hemorrhage. Chronic small vessel ischemic changes elsewhere throughout the brain, similar to the study of 2015.   2D Echocardiogram  - Left ventricle: The cavity size was normal. Wall thickness wasnormal. Systolic function was normal. The estimated ejectionfraction was in the range of 60% to 65%. Wall motion was normal;there were no regional wall motion abnormalities. - Pulmonic valve: There was moderate regurgitation. - Pulmonary arteries: Systolic pressure was mildly increased. PApeak pressure: 32 mm Hg (S). Impressions:   No cardiac source of emboli was indentified. Compared to theprior study, there has been no significant interval change.   PHYSICAL EXAM Constitutional: Appears well-developed and well-nourished. She is wearing a wig Psych: Affect appropriate to situation HENT: NCAT, PERRL, sclera clear  Cardiovascular: Normal rate and regular rhythm.  Respiratory: Effort normal and breath sounds normal to anterior ascultation GI: Soft. No distension. There is no tenderness.  Extrem:  Slight swelling in LEs   Neuro: Mental Status: Patient is awake, alert, oriented to person, place, month, year, and situation. Patient is able to give a clear and coherent history. No signs of aphasia or neglect Cranial Nerves: II: Visual Fields are full. Pupils are  equal, round, and reactive to light.  III,IV, VI: EOMI without ptosis or diploplia.  V: Facial sensation is symmetric to temperature VII: Facial movement is symmetric.  VIII: hearing is intact to voice X: Uvula elevates symmetrically XI: Shoulder shrug is symmetric. XII: tongue is midline without atrophy or fasciculations.  Motor: Tone is normal. Bulk is normal. 5/5 strength was present in all four extremities.  Sensory: Sensation is  symmetric to light touch and temperature in the arms and legs. Cerebellar: FNF and HKS are intact bilaterally   ASSESSMENT/PLAN [name] [name] is a 82 y.o. female with history of hypertension, asthma, peripheral vascular disease, and anxiety presenting with dizziness/vertigo.. She did not receive IV t-PA due to resolution of deficits.  Stroke:  Punctate low cerebellar and punctate left posterior temporal region infarct secondary to small vessel disease  Resultant  dizziness  MRI Punctate acute infarction in the peripheral left cerebellum and in the peripheral left posterior temporal region.  MRA - negative  Carotid Doppler - refer to MRA of the neck  2D Echo No source of embolus   LDL - 80  HgbA1c 5.7  VTE prophylaxis - SCDs Diet Heart Room service appropriate?: Yes; Fluid consistency:: Thin  No antithrombotic prior to admission, now on clopidogrel 75 mg daily  Patient counseled to be compliant with her antithrombotic medications  Ongoing aggressive stroke risk factor management  Therapy recommendations:  Home health physical therapy recommended, NO ST or OT  Disposition: return home with home health  Hypertension  Improved  Hyperlipidemia  Home meds:  No lipid lowering medications prior to admission.  LDL 80, goal < 70  Added low-dose Lipitor 10 mg daily  Continue statin at discharge  Other Stroke Risk Factors  Advanced age  Family hx stroke (father)  Other Active Problems  Mildly elevated BUN  Mildly elevated glucose levels  NOTHING FURTHER TO ADD FROM THE STROKE STANDPOINT  Patient has a 10-15% risk of having another stroke over the next year, the highest risk is within 2 weeks of the most recent stroke/TIA (risk of having a stroke following a stroke or TIA is the same).  Ongoing risk factor control by Primary Care Physician  Stroke Service will sign off. Please call should any needs arise.  Follow[facility]up Stroke [facility] at [facility] with nurse practitioner for Dr. Antony Contras, Cecille Rubin, in 2 months, order placed.   Hospital day # [place] for Pager information 12/11/2015 4:38 PM  I have personally examined this patient, reviewed notes, independently viewed imaging studies, participated in medical decision making and plan of care. I have made any additions or clarifications directly to the above note. Agree with note above.  She presented with 2 transient episodes of vertigo and MRI shows tiny punctate brainstem and cerebellar infarcts which are likely due to small vessel disease. She remains at risk for neurological worsening, recurrent stroke, TIA needs ongoing stroke evaluation and aggressive risk factor modification.  Antony Contras, MD Medical Director [place] Pager: [phone] 12/11/2015 6:28 PM  To contact Stroke Continuity provider, please refer to [email or web address] After hours, contact General Neurology

## 2015-12-11 NOTE — Discharge Summary (Signed)
Physician Discharge Summary  [name] [name] [id number] DOB: [birth year] DOA: 12/09/2015  PCP: Hollace Kinnier, DO  Admit date: 12/09/2015 Discharge date: 12/11/2015  Time spent: > 35 minutes  Recommendations for Outpatient Follow-up:   Please follow up with neurology as outpatient  Monitor blood sugars  Monitor blood pressure and decide whether or not to continue antihypertensive regimen.  Discharge Diagnoses:  Active Problems:   Glaucoma   Gastroesophageal reflux disease without esophagitis   Acute CVA (cerebrovascular accident) ([place])   Dizziness   Accelerated hypertension   CVA (cerebral infarction)   Cerebral infarction due to unspecified mechanism   Discharge Condition: stable  Diet recommendation: low sodium/carb modified diet  Filed Weights   12/09/15 1957  Weight: 57.97 kg (127 lb 12.8 oz)    History of present illness:  From original HPI: 82 y.o. woman with a history of HTN, asthma, and anxiety who presents for evaluation of dizziness, which began on Wednesday. The patient had an episode of dizziness at home that lasted for several seconds, and did not have any associated symptoms except for slight loss of balance (but she did not fall, hit her head, or lose consciousness). She denies associated headache, tinnitus, palpitations. She symptoms were self-limited, though she reports feeling "not like herself" afterwards. She denies localized/focal weakness. She rested for the rest of the day and thought that she was back to her normal self until this afternoon, when she had another episode of dizziness. She notified her goddaughter, who is at bedside, and the patient was brought to the ED by private car for further evaluation.  Hospital Course:  According to neurology's notes:  [name] [name] is a 82 y.o. female with history of hypertension, asthma, peripheral vascular disease, and anxiety presenting with dizziness/vertigo.. She did not receive IV t-PA due to  resolution of deficits.  Stroke: Punctate low cerebellar and punctate left posterior temporal region infarct secondary to small vessel disease  Resultant dizziness  MRI Punctate acute infarction in the peripheral left cerebellum and in the peripheral left posterior temporal region.  MRA - negative  Carotid Doppler - refer to MRA of the neck  2D Echo No source of embolus  LDL - 80  HgbA1c 5.7  VTE prophylaxis - SCDs  Diet Heart Room service appropriate?: Yes; Fluid consistency:: Thin  No antithrombotic prior to admission, now on clopidogrel 75 mg daily  Patient counseled to be compliant with her antithrombotic medications  Ongoing aggressive stroke risk factor management  Therapy recommendations: Home health physical therapy recommended, NO ST or OT  Disposition: return home with home health  Hypertension  Improved  Hyperlipidemia  Home meds: No lipid lowering medications prior to admission.  LDL 80, goal < 70  Added low-dose Lipitor 10 mg daily  Continue statin at discharge  Other Stroke Risk Factors  Advanced age  Family hx stroke (father)  Other Active Problems  Mildly elevated BUN  Mildly elevated glucose levels  NOTHING FURTHER TO ADD FROM THE STROKE STANDPOINT  Patient has a 10-15% risk of having another stroke over the next year, the highest risk is within 2 weeks of the most recent stroke/TIA (risk of having a stroke following a stroke or TIA is the same).  Ongoing risk factor control by Primary Care Physician  Stroke Service will sign off. Please call should any needs arise.  Follow-up Stroke Clinic at [facility] with nurse practitioner for Dr. Antony Contras, Cecille Rubin, in 2 months, order placed.    Procedures :  Ct Head Wo Contrast 12/09/2015  Atrophy with small vessel chronic ischemic changes of deep cerebral white matter. No acute intracranial abnormalities.  MRA HEAD  12/10/2015  Negative intracranial  MRA of the brain. No large or proximal arterial branch occlusion. No high-grade or correctable stenosis.   MRA NECK  12/10/2015  1. No flow limiting stenosis or other acute abnormality within the major arterial vasculature of the neck.  2. Tortuosity of the arterial vasculature of the neck, suggesting chronic underlying hypertension.   Mr Brain Wo Contrast 12/09/2015  Punctate acute infarction in the peripheral left cerebellum and in the peripheral left posterior temporal region, consistent with micro embolic infarctions in the posterior circulation. No large infarction, swelling or hemorrhage. Chronic small vessel ischemic changes elsewhere throughout the brain, similar to the study of 2015.   2D Echocardiogram  - Left ventricle: The cavity size was normal. Wall thickness wasnormal. Systolic function was normal. The estimated ejectionfraction was in the range of 60% to 65%. Wall motion was normal;there were no regional wall motion abnormalities. - Pulmonic valve: There was moderate regurgitation. - Pulmonary arteries: Systolic pressure was mildly increased. PApeak pressure: 32 mm Hg (S). Impressions: No cardiac source of emboli was indentified. Compared to theprior study, there has been no significant interval change.  Consultations:  Neurology  Discharge Exam: Filed Vitals:   12/11/15 1400 12/11/15 1749  BP: 132/52 133/54  Pulse: 57 57  Temp: 98.1 F (36.7 C) 98.1 F (36.7 C)  Resp: 17 17    General: Pt in nad, alert and awake Cardiovascular: rrr, no mrg Respiratory: no increased wob, no wheezes  Discharge Instructions   Discharge Instructions    Ambulatory referral to Neurology    Complete by:  As directed   This is a routine stroke follow up. An appointment is requested in 1 month with NP     Call MD for:  severe uncontrolled pain    Complete by:  As directed      Call MD for:  temperature >100.4    Complete by:  As directed      Diet - low sodium heart  healthy    Complete by:  As directed      Discharge instructions    Complete by:  As directed   Please follow-up with neurology as directed by neurology prior to hospital discharge     Increase activity slowly    Complete by:  As directed           Current Discharge Medication List    START taking these medications   Details  atorvastatin (LIPITOR) 10 MG tablet Take 1 tablet (10 mg total) by mouth daily at 6 PM. Qty: 30 tablet, Refills: 0    clopidogrel (PLAVIX) 75 MG tablet Take 1 tablet (75 mg total) by mouth daily. Qty: 30 tablet, Refills: 0      CONTINUE these medications which have NOT CHANGED   Details  acetaminophen (TYLENOL) 500 MG tablet Take 1 tablet (500 mg total) by mouth every 6 (six) hours as needed for moderate pain. Qty: 30 tablet, Refills: 0   Associated Diagnoses: Osteoarthritis of multiple joints, unspecified osteoarthritis type    albuterol (PROVENTIL HFA;VENTOLIN HFA) 108 (90 BASE) MCG/ACT inhaler Inhale 2 puffs into the lungs every 6 (six) hours as needed for wheezing.    alum hydroxide-mag trisilicate (GAVISCON) [id number] MG CHEW chewable tablet Chew 1 tablet by mouth 3 (three) times daily as needed for flatulence (bowel spasms). Qty: 1 each, Refills: 0  Associated Diagnoses: Spastic colon    bimatoprost (LUMIGAN) 0.01 % SOLN Place 1 drop into both eyes at bedtime.    brinzolamide (AZOPT) 1 % ophthalmic suspension Place 1 drop into both eyes 3 (three) times daily.    Calcium Carbonate-Vitamin D (CALTRATE 600+D) 600-400 MG-UNIT per tablet Take 2 tablets by mouth daily.    Coenzyme Q10 (COQ10) 50 MG CAPS Take one tablet by mouth once daily    fluticasone (FLONASE) 50 MCG/ACT nasal spray Place 1 spray into the nose daily.    hydrocortisone (ANUSOL-HC) 2.5 % rectal cream Place 1 application rectally 2 (two) times daily. Qty: 30 g, Refills: 3   Associated Diagnoses: Hemorrhoids, unspecified hemorrhoid type    lansoprazole (PREVACID) 3 mg/ml SUSP oral  suspension Take 10 mLs (30 mg total) by mouth daily before supper. Qty: 120 mL, Refills: 3   Associated Diagnoses: Gastroesophageal reflux disease without esophagitis    loteprednol (LOTEMAX) 0.5 % ophthalmic suspension Place 1 drop into the left eye daily.    Multiple Vitamins-Minerals (SENIOR MULTIVITAMIN PLUS) TABS Take by mouth.    Omega-3 Fatty Acids (FISH OIL) 1200 MG CPDR Take one tablet by mouth once daily    RESTASIS 0.05 % ophthalmic emulsion Place 1 drop into both eyes 2 (two) times daily.     risperiDONE (RISPERDAL) 0.5 MG tablet TAKE 1 TABLET BY MOUTH TWICE A DAY Qty: 60 tablet, Refills: 5    senna-docusate (SENOKOT-S) 8.6-50 MG tablet Take 1 tablet by mouth daily. Qty: 30 tablet, Refills: 3   Associated Diagnoses: Slow transit constipation    SYMBICORT 80-4.5 MCG/ACT inhaler Place 2 puffs into alternate nostrils 2 (two) times daily.    vitamin B-12 (CYANOCOBALAMIN) 1000 MCG tablet Take 1,000 mcg by mouth daily.    vitamin C (ASCORBIC ACID) 500 MG tablet Take 500 mg by mouth daily.    nitroGLYCERIN (NITROSTAT) 0.4 MG SL tablet Place 1 tablet (0.4 mg total) under the tongue every 5 (five) minutes as needed for chest pain. Qty: 60 tablet, Refills: 2      STOP taking these medications     amLODipine (NORVASC) 5 MG tablet        Allergies  Allergen Reactions  . Aspirin     sweats  . Codeine   . Iodine     Pt is not aware of this allergy, does not recall much info....//a.c.  . Prednisone     sweats  . Sulfa Antibiotics     Doesn't remember   Follow-up Information    Follow up with MARTIN,NANCY CAROLYN, NP In 2 months.   Specialty:  Family Medicine   Why:  Stroke Clinic, Office will call you with appointment date & time   Contact information:   [address] [place] [address] [phone]        The results of significant diagnostics from this hospitalization (including imaging, microbiology, ancillary and laboratory) are listed below  for reference.    Significant Diagnostic Studies: Ct Head Wo Contrast  12/09/2015  CLINICAL DATA:  Intermittent dizzy spells, one on Wednesday and another today, some shortness of breath, history asthma, peripheral vascular disease, GERD EXAM: CT HEAD WITHOUT CONTRAST TECHNIQUE: Contiguous axial images were obtained from the base of the skull through the vertex without intravenous contrast. COMPARISON:  None. FINDINGS: Generalized atrophy. Normal ventricular morphology. No midline shift or mass effect. Small vessel chronic ischemic changes of deep cerebral white matter. No intracranial hemorrhage, mass lesion, or acute infarction. Visualized paranasal sinuses and  mastoid air cells clear. Bones unremarkable. Atherosclerotic calcifications at the carotid siphons. IMPRESSION: Atrophy with small vessel chronic ischemic changes of deep cerebral white matter. No acute intracranial abnormalities. Electronically Signed   By: Lavonia Dana M.D.   On: 12/09/2015 15:15   Mr Jodene Nam Head Wo Contrast  12/10/2015  CLINICAL DATA:  Initial evaluation for stroke. EXAM: MRA NECK WITHOUT AND WITH CONTRAST MRA HEAD WITHOUT CONTRAST TECHNIQUE: Multiplanar and multiecho pulse sequences of the neck were obtained without and with intravenous contrast. Angiographic images of the neck were obtained using MRA technique without and with intravenous contrast.; Angiographic images of the Circle of Willis were obtained using MRA technique without intravenous contrast. CONTRAST:  85mL MULTIHANCE GADOBENATE DIMEGLUMINE 529 MG/ML IV SOLN COMPARISON:  Prior MRI from 12/09/2015. FINDINGS: MRA NECK FINDINGS Visualized aortic arch is of normal caliber with normal branch pattern. Visualized subclavian arteries widely patent. No high-grade stenosis seen at the origin of the great vessels. Right common carotid artery tortuous proximally but widely patent to the bifurcation. No significant atheromatous plaque about the bifurcation. Right ICA widely patent  from the bifurcation to the skullbase. Right ICA is mildly tortuous. No high-grade stenosis, dissection, or vascular occlusion within the right carotid artery system. Left common carotid artery patent from its origin to the bifurcation. Left common carotid artery mildly tortuous proximally. No significant atheromatous plaque appreciated about the left carotid bifurcation. Left ICA patent from the bifurcation to the skullbase. Left ICA mildly tortuous. No high-grade stenosis, dissection, or vascular occlusion within the left carotid artery system. Vertebral arteries patent from the origin to the skullbase without evidence for dissection, occlusion, or stenosis. Vertebral arteries are tortuous bilaterally. Please note that the origin of the vertebral arteries not well evaluated on this exam. MRA HEAD FINDINGS ANTERIOR CIRCULATION: Visualized distal cervical segments of the internal carotid arteries are mildly tortuous but widely patent with antegrade flow. Petrous, cavernous, and supraclinoid segments patent without high-grade stenosis or occlusion. Mild atheromatous irregularity within the cavernous/ supraclinoid ICAs bilaterally. A1 segments patent. Anterior communicating artery normal. Anterior cerebral arteries well opacified to their distal aspects. M1 segments patent without stenosis or occlusion. MCA bifurcations within normal limits. No proximal M2 branch occlusion. Visualized distal MCA branches symmetric and well opacified bilaterally. POSTERIOR CIRCULATION: Vertebral arteries are patent to the vertebrobasilar junction. Right vertebral artery is dominant. Posterior inferior cerebral arteries not well evaluated on this exam. Mild attenuation of the proximal basilar artery favored to be related to motion on this study. Basilar artery otherwise well opacified to its distal aspect. Superior cerebral arteries patent bilaterally. Both posterior cerebral arteries arise from the basilar artery and are well opacified  to their distal aspects. No aneurysm or vascular malformation within the intracranial circulation. IMPRESSION: MRA HEAD IMPRESSION: Negative intracranial MRA of the brain. No large or proximal arterial branch occlusion. No high-grade or correctable stenosis. MRA NECK IMPRESSION: 1. No flow limiting stenosis or other acute abnormality within the major arterial vasculature of the neck. 2. Tortuosity of the arterial vasculature of the neck, suggesting chronic underlying hypertension. Electronically Signed   By: Jeannine Boga M.D.   On: 12/10/2015 06:06   Mr Angiogram Neck W Wo Contrast  12/10/2015  CLINICAL DATA:  Initial evaluation for stroke. EXAM: MRA NECK WITHOUT AND WITH CONTRAST MRA HEAD WITHOUT CONTRAST TECHNIQUE: Multiplanar and multiecho pulse sequences of the neck were obtained without and with intravenous contrast. Angiographic images of the neck were obtained using MRA technique without and with intravenous contrast.; Angiographic images  of the Circle of Willis were obtained using MRA technique without intravenous contrast. CONTRAST:  26mL MULTIHANCE GADOBENATE DIMEGLUMINE 529 MG/ML IV SOLN COMPARISON:  Prior MRI from 12/09/2015. FINDINGS: MRA NECK FINDINGS Visualized aortic arch is of normal caliber with normal branch pattern. Visualized subclavian arteries widely patent. No high-grade stenosis seen at the origin of the great vessels. Right common carotid artery tortuous proximally but widely patent to the bifurcation. No significant atheromatous plaque about the bifurcation. Right ICA widely patent from the bifurcation to the skullbase. Right ICA is mildly tortuous. No high-grade stenosis, dissection, or vascular occlusion within the right carotid artery system. Left common carotid artery patent from its origin to the bifurcation. Left common carotid artery mildly tortuous proximally. No significant atheromatous plaque appreciated about the left carotid bifurcation. Left ICA patent from the  bifurcation to the skullbase. Left ICA mildly tortuous. No high-grade stenosis, dissection, or vascular occlusion within the left carotid artery system. Vertebral arteries patent from the origin to the skullbase without evidence for dissection, occlusion, or stenosis. Vertebral arteries are tortuous bilaterally. Please note that the origin of the vertebral arteries not well evaluated on this exam. MRA HEAD FINDINGS ANTERIOR CIRCULATION: Visualized distal cervical segments of the internal carotid arteries are mildly tortuous but widely patent with antegrade flow. Petrous, cavernous, and supraclinoid segments patent without high-grade stenosis or occlusion. Mild atheromatous irregularity within the cavernous/ supraclinoid ICAs bilaterally. A1 segments patent. Anterior communicating artery normal. Anterior cerebral arteries well opacified to their distal aspects. M1 segments patent without stenosis or occlusion. MCA bifurcations within normal limits. No proximal M2 branch occlusion. Visualized distal MCA branches symmetric and well opacified bilaterally. POSTERIOR CIRCULATION: Vertebral arteries are patent to the vertebrobasilar junction. Right vertebral artery is dominant. Posterior inferior cerebral arteries not well evaluated on this exam. Mild attenuation of the proximal basilar artery favored to be related to motion on this study. Basilar artery otherwise well opacified to its distal aspect. Superior cerebral arteries patent bilaterally. Both posterior cerebral arteries arise from the basilar artery and are well opacified to their distal aspects. No aneurysm or vascular malformation within the intracranial circulation. IMPRESSION: MRA HEAD IMPRESSION: Negative intracranial MRA of the brain. No large or proximal arterial branch occlusion. No high-grade or correctable stenosis. MRA NECK IMPRESSION: 1. No flow limiting stenosis or other acute abnormality within the major arterial vasculature of the neck. 2.  Tortuosity of the arterial vasculature of the neck, suggesting chronic underlying hypertension. Electronically Signed   By: Jeannine Boga M.D.   On: 12/10/2015 06:06   Mr Brain Wo Contrast  12/09/2015  CLINICAL DATA:  Intermittent dizzy spells, most recent 1 earlier today. EXAM: MRI HEAD WITHOUT CONTRAST TECHNIQUE: Multiplanar, multiecho pulse sequences of the brain and surrounding structures were obtained without intravenous contrast. COMPARISON:  CT same day.  MRI 06/10/2014. FINDINGS: Diffusion imaging shows a punctate acute infarction in the periphery of the inferior cerebellum on the left. There is a 4 mm acute infarction at the right posterior temporal cortex. Both of these are consistent with micro embolic infarctions in the posterior circulation. No sign of swelling or hemorrhage. There chronic small-vessel ischemic changes throughout the pons. Cerebral hemispheres elsewhere show chronic small-vessel ischemic changes within the deep and subcortical white matter. No large vessel territory infarction. No mass lesion, hemorrhage, hydrocephalus or extra-axial collection. No pituitary mass. No significant sinus disease. IMPRESSION: Punctate acute infarction in the peripheral left cerebellum and in the peripheral left posterior temporal region, consistent with micro embolic infarctions in the  posterior circulation. No large infarction, swelling or hemorrhage. Chronic small vessel ischemic changes elsewhere throughout the brain, similar to the study of 2015. Electronically Signed   By: Nelson Chimes M.D.   On: 12/09/2015 17:02    Microbiology: No results found for this or any previous visit (from the past 240 hour(s)).   Labs: Basic Metabolic Panel:  Recent Labs Lab 12/09/15 1414 12/09/15 1421  NA 142 142  K 3.9 3.9  CL 108 105  CO2 25  --   GLUCOSE 122* 122*  BUN 20 22*  CREATININE 0.80 0.90  CALCIUM 9.3  --    Liver Function Tests:  Recent Labs Lab 12/09/15 1414  AST 22  ALT 18   ALKPHOS 63  BILITOT 0.9  PROT 6.6  ALBUMIN 3.7   No results for input(s): LIPASE, AMYLASE in the last 168 hours. No results for input(s): AMMONIA in the last 168 hours. CBC:  Recent Labs Lab 12/09/15 1414 12/09/15 1421  WBC 5.3  --   NEUTROABS 3.1  --   HGB 11.5* 12.6  HCT 33.7* 37.0  MCV 90.1  --   PLT 211  --    Cardiac Enzymes: No results for input(s): CKTOTAL, CKMB, CKMBINDEX, TROPONINI in the last 168 hours. BNP: BNP (last 3 results) No results for input(s): BNP in the last 8760 hours.  ProBNP (last 3 results) No results for input(s): PROBNP in the last 8760 hours.  CBG:  Recent Labs Lab 12/09/15 1540  GLUCAP 103*     Signed:  Velvet Bathe MD.  [facility] 12/11/2015, 6:00 PM

## 2015-12-11 NOTE — Progress Notes (Signed)
Physical Therapy Treatment Patient Details Name: [name] [name] MRN: [id number] DOB: 07/30/1924 Today's Date: 12/11/2015    History of Present Illness Pt admitted with 2 punctate infarcts in the posterior circulation.Marland Kitchen  PMH sinificant for PVD, HTN, OA, anxiety    PT Comments    Patient able to ambulate 210 feet with supervision and go up/down 3 steps with min guard assistance. The patient states that she will have her daughter staying with her when she goes home. Patient states that she feels a little more unsteady than usual but feels confident with going home. PT to continue to follow.    Follow Up Recommendations  Home health PT;Supervision - Intermittent     Equipment Recommendations  None recommended by PT    Recommendations for Other Services       Precautions / Restrictions Precautions Precautions: Fall Restrictions Weight Bearing Restrictions: No    Mobility  Bed Mobility Overal bed mobility: Independent             General bed mobility comments: supine to sit  Transfers Overall transfer level: Needs assistance Equipment used: None Transfers: Sit to/from Stand Sit to Stand: Supervision         General transfer comment: no instability with initial stand.   Ambulation/Gait Ambulation/Gait assistance: Supervision Ambulation Distance (Feet): 210 Feet Assistive device: Straight cane Gait Pattern/deviations: Step-through pattern Gait velocity: decreased   General Gait Details: Occasionally just carrying cane. Encouraged pt to use when home for safety. No loss of balance or instability noted.    Stairs Stairs: Yes Stairs assistance: Min guard Stair Management: One rail Left Number of Stairs: 3 General stair comments: difficulty controlling desent of stairs, pt reports these steps are higher than her steps at home.   Wheelchair Mobility    Modified Rankin (Stroke Patients Only)       Balance Overall balance assessment: Needs  assistance Sitting-balance support: No upper extremity supported Sitting balance-Leahy Scale: Good     Standing balance support: No upper extremity supported Standing balance-Leahy Scale: Good Standing balance comment: able to maintain balance without support with head turns, flexion/extension, eyes closed and in staggered stance.                     Cognition Arousal/Alertness: Awake/alert Behavior During Therapy: WFL for tasks assessed/performed Overall Cognitive Status: Within Functional Limits for tasks assessed                      Exercises      General Comments        Pertinent Vitals/Pain Pain Assessment: No/denies pain    Home Living                      Prior Function            PT Goals (current goals can now be found in the care plan section) Acute Rehab PT Goals Patient Stated Goal: to feel better on her feet and then go home PT Goal Formulation: With patient Time For Goal Achievement: 12/16/15 Potential to Achieve Goals: Good Progress towards PT goals: Progressing toward goals    Frequency  Min 3X/week    PT Plan Current plan remains appropriate    Co-evaluation             End of Session Equipment Utilized During Treatment: Gait belt Activity Tolerance: Patient tolerated treatment well Patient left: in chair;with call bell/phone within reach;Other (comment) (discussed with pt, call  nsg to get up)     Time: [id number] PT Time Calculation (min) (ACUTE ONLY): 18 min  Charges:  $Gait Training: 8-22 mins                    G Codes:      Cassell Clement, PT, CSCS Pager [phone] Office [phone]  12/11/2015, 9:10 AM

## 2015-12-11 NOTE — Evaluation (Signed)
Occupational Therapy Evaluation Patient Details Name: [name] [name] MRN: [id number] DOB: [birth year] Today's Date: 12/11/2015    History of Present Illness Pt admitted with 2 left punctate infarcts in the posterior circulation.Marland Kitchen  PMH sinificant for PVD, HTN, OA, anxiety, GERD, and glaucoma.   Clinical Impression   Pt admitted with above. Pt independent with ADLs, PTA. Feel pt will benefit from acute OT to increase independence and strength prior to d/c.     Follow Up Recommendations  No OT follow up;Supervision - Intermittent    Equipment Recommendations  None recommended by OT    Recommendations for Other Services       Precautions / Restrictions Precautions Precautions: Fall Restrictions Weight Bearing Restrictions: No      Mobility Bed Mobility Overal bed mobility: Modified Independent                Transfers Overall transfer level: Needs assistance   Transfers: Sit to/from Stand Sit to Stand: Min guard              Balance      LOB with single leg stance-trying to simulate functional task.                                      ADL Overall ADL's : Needs assistance/impaired                     Lower Body Dressing: Set up;Supervision/safety;Sit to/from stand   Toilet Transfer: Ambulation;Min guard (sit to stand from bed)           Functional mobility during ADLs: Min guard General ADL Comments: Educated on safety such as sitting for LB ADLs and recommended someone be with her for tub transfer and bathing at least initially.  Discussed a couple strategies for vision.     Vision  Pt wears glasses. Reports no change from baseline with vision. Pt has glaucoma and does not drive.   Perception     Praxis      Pertinent Vitals/Pain Pain Assessment: No/denies pain     Hand Dominance     Extremity/Trunk Assessment Upper Extremity Assessment Upper Extremity Assessment: Overall WFL for tasks assessed;Generalized  weakness (weakness in bilateral shoulder flexors)   Lower Extremity Assessment Lower Extremity Assessment: Defer to PT evaluation       Communication Communication Communication: No difficulties   Cognition Arousal/Alertness: Awake/alert Behavior During Therapy: WFL for tasks assessed/performed Overall Cognitive Status: Within Functional Limits for tasks assessed                     General Comments       Exercises       Shoulder Instructions      Home Living Family/patient expects to be discharged to:: Private residence Living Arrangements: Alone Available Help at Discharge: Family;Available 24 hours/day (daughter staying a few days) Type of Home: House Home Access: Stairs to enter [employer] of Steps: 3 Entrance Stairs-Rails: Right Home Layout: One level     Bathroom Shower/Tub: Teacher, early years/pre: Standard     Home Equipment: Cane - single point;Shower seat          Prior Functioning/Environment Level of Independence: Independent with assistive device(s)        Comments: Pt reports doing all basic ADLs and some cooking.  Caren Macadam does grocery shopping and cleaning as well as assists  with cooking for her. She reports no history of falls.     OT Diagnosis: Generalized weakness   OT Problem List: Decreased strength;Impaired balance (sitting and/or standing);Impaired vision/perception;Decreased knowledge of use of DME or AE   OT Treatment/Interventions: Self-care/ADL training;Therapeutic exercise;DME and/or AE instruction;Therapeutic activities;Patient/family education;Balance training;Visual/perceptual remediation/compensation    OT Goals(Current goals can be found in the care plan section) Acute Rehab OT Goals Patient Stated Goal: not stated OT Goal Formulation: With patient Time For Goal Achievement: 12/18/15 Potential to Achieve Goals: Good ADL Goals Pt Will Perform Lower Body Dressing: with modified independence;sit  to/from stand (including gathering items from low drawer) Pt Will Perform Tub/Shower Transfer: Tub transfer;ambulating;shower seat;with set-up Additional ADL Goal #1: Pt will independently perform HEP for bilateral UEs to increase strength.  OT Frequency: Min 2X/week   Barriers to D/C:            Co-evaluation              End of Session Equipment Utilized During Treatment: Gait belt Nurse Communication: Mobility status  Activity Tolerance: Patient tolerated treatment well Patient left: in bed;with call bell/phone within reach;with bed alarm set   Time: 1343-1356 OT Time Calculation (min): 13 min Charges:  OT General Charges $OT Visit: 1 Procedure OT Evaluation $OT Eval Moderate Complexity: 1 Procedure G-CodesBenito Mccreedy OTR/L [id number] 12/11/2015, 3:01 PM

## 2015-12-12 NOTE — Care Management Note (Signed)
Case Management Note  Patient Details  Name: [name] [name] MRN: [id number] Date of Birth: 1924-08-05  Subjective/Objective:                    Action/Plan: 12/12/2015 @ 1200: Patient discharged late yesterday with orders for HHPT. CM called and spoke to the patient this am and read off the list of [facility] agencies in [place]. She chose [place]. [name] with [facility] notified and accepted the referral.   Expected Discharge Date:                  Expected Discharge Plan:     In-House Referral:     Discharge planning Services     Post Acute Care Choice:    Choice offered to:     DME Arranged:    DME Agency:     HH Arranged:    [place] Agency:     Status of Service:  In process, will continue to follow  Medicare Important Message Given:    Date Medicare IM Given:    Medicare IM give by:    Date Additional Medicare IM Given:    Additional Medicare Important Message give by:     If discussed at [place] of Stay Meetings, dates discussed:    Additional Comments:  Pollie Friar, RN 12/12/2015, 1:58 PM

## 2015-12-13 ENCOUNTER — Ambulatory Visit (INDEPENDENT_AMBULATORY_CARE_PROVIDER_SITE_OTHER): Payer: Medicare Other | Admitting: Nurse Practitioner

## 2015-12-13 ENCOUNTER — Encounter: Payer: Self-pay | Admitting: Nurse Practitioner

## 2015-12-13 VITALS — BP 130/62 | HR 60 | Ht 62.0 in | Wt 136.0 lb

## 2015-12-13 DIAGNOSIS — I739 Peripheral vascular disease, unspecified: Secondary | ICD-10-CM | POA: Diagnosis not present

## 2015-12-13 DIAGNOSIS — I10 Essential (primary) hypertension: Secondary | ICD-10-CM | POA: Diagnosis not present

## 2015-12-13 DIAGNOSIS — I639 Cerebral infarction, unspecified: Secondary | ICD-10-CM

## 2015-12-13 DIAGNOSIS — I69398 Other sequelae of cerebral infarction: Secondary | ICD-10-CM | POA: Diagnosis not present

## 2015-12-13 DIAGNOSIS — F419 Anxiety disorder, unspecified: Secondary | ICD-10-CM | POA: Diagnosis not present

## 2015-12-13 DIAGNOSIS — E785 Hyperlipidemia, unspecified: Secondary | ICD-10-CM

## 2015-12-13 DIAGNOSIS — M6281 Muscle weakness (generalized): Secondary | ICD-10-CM | POA: Diagnosis not present

## 2015-12-13 DIAGNOSIS — M1990 Unspecified osteoarthritis, unspecified site: Secondary | ICD-10-CM | POA: Diagnosis not present

## 2015-12-13 DIAGNOSIS — J45909 Unspecified asthma, uncomplicated: Secondary | ICD-10-CM | POA: Diagnosis not present

## 2015-12-13 NOTE — Patient Instructions (Addendum)
We will be checking the following labs today - NONE   Medication Instructions:    Continue with your current medicines.     Testing/Procedures To Be Arranged:  N/A  Follow-Up:   See Dr. Radford Pax in 6 months.    Other Special Instructions:   N/A    If you need a refill on your cardiac medications before your next appointment, please call your pharmacy.   Call the [place] office at [phone] if you have any questions, problems or concerns.

## 2015-12-13 NOTE — Progress Notes (Signed)
CARDIOLOGY OFFICE NOTE  Date:  12/13/2015    Colon Branch Date of Birth: 1924-09-04 Medical Record [id number]  PCP:  Hollace Kinnier, DO  Cardiologist:  Radford Pax    Chief Complaint  Patient presents with  . Cerebrovascular Accident    Post hospital/follow up visit - seen for Dr. Radford Pax    History of Present Illness: [name] [name] is a 82 y.o. female who presents today for a post hospital/follow up  visit. Seen for Dr. Radford Pax.   She has a history of HTN, asthma and anxiety. Last seen here back in May of 2016.   Presented earlier this month with dizziness - found to have acute CVA. MRI with punctate acute infarction in the peripheral left cerebellum and in the peripheral left posterior temporal region. No TPA due to resolution of deficits. Discharged this past Monday. She was placed on statin and Plavix.   Comes in today. Here with her daughter. Has already stopped her Lipitor due to stomach pain. "Getting better everday". Her only neuro symptom was dizziness. Seeing PCP tomorrow. Has planned follow up with neurology. No AF noted. Neurology felt that her infarcts were likely due to small vessel disease. No chest pain reported. Breathing is ok. She does not wish to restart her statin.   Past Medical History  Diagnosis Date  . Benign essential hypertension   . Osteoarthritis   . GERD (gastroesophageal reflux disease)   . Seasonal allergies   . Asthma   . Glaucoma   . PVD (peripheral vascular disease) ([place])   . Anxiety     Past Surgical History  Procedure Laterality Date  . Abdominal hysterectomy  1981  . Tonsillectomy  1953     Medications: Current Outpatient Prescriptions  Medication Sig Dispense Refill  . acetaminophen (TYLENOL) 500 MG tablet Take 1 tablet (500 mg total) by mouth every 6 (six) hours as needed for moderate pain. 30 tablet 0  . albuterol (PROVENTIL HFA;VENTOLIN HFA) 108 (90 BASE) MCG/ACT inhaler Inhale 2 puffs into the lungs every 6 (six) hours as  needed for wheezing.    Marland Kitchen alum hydroxide-mag trisilicate (GAVISCON) [id number] MG CHEW chewable tablet Chew 1 tablet by mouth 3 (three) times daily as needed for flatulence (bowel spasms). 1 each 0  . amLODipine (NORVASC) 5 MG tablet TAKE 1 TABLET (5 MG TOTAL) BY MOUTH DAILY.  5  . bimatoprost (LUMIGAN) 0.01 % SOLN Place 1 drop into both eyes at bedtime.    . brinzolamide (AZOPT) 1 % ophthalmic suspension Place 1 drop into both eyes 3 (three) times daily.    . Calcium Carbonate-Vitamin D (CALTRATE 600+D) 600-400 MG-UNIT per tablet Take 2 tablets by mouth daily.    . clopidogrel (PLAVIX) 75 MG tablet Take 1 tablet (75 mg total) by mouth daily. 30 tablet 0  . Coenzyme Q10 (COQ10) 50 MG CAPS Take one tablet by mouth once daily    . fluticasone (FLONASE) 50 MCG/ACT nasal spray Place 1 spray into the nose daily.    . hydrocortisone (ANUSOL-HC) 2.5 % rectal cream Place 1 application rectally 2 (two) times daily. 30 g 3  . lansoprazole (PREVACID) 3 mg/ml SUSP oral suspension Take 10 mLs (30 mg total) by mouth daily before supper. 120 mL 3  . loteprednol (LOTEMAX) 0.5 % ophthalmic suspension Place 1 drop into both eyes daily.     . Multiple Vitamins-Minerals (SENIOR MULTIVITAMIN PLUS) TABS Take by mouth.    . nitroGLYCERIN (NITROSTAT) 0.4 MG SL tablet Place 1  tablet (0.4 mg total) under the tongue every 5 (five) minutes as needed for chest pain. 60 tablet 2  . Omega-3 Fatty Acids (FISH OIL) 1200 MG CPDR Take one tablet by mouth once daily    . RESTASIS 0.05 % ophthalmic emulsion Place 1 drop into both eyes 2 (two) times daily.     . risperiDONE (RISPERDAL) 0.5 MG tablet TAKE 1 TABLET BY MOUTH TWICE A DAY 60 tablet 5  . senna-docusate (SENOKOT-S) 8.6-50 MG tablet Take 1 tablet by mouth daily. 30 tablet 3  . SYMBICORT 80-4.5 MCG/ACT inhaler Place 2 puffs into alternate nostrils 2 (two) times daily.    . vitamin B-12 (CYANOCOBALAMIN) 1000 MCG tablet Take 1,000 mcg by mouth daily.    . vitamin C (ASCORBIC ACID)  500 MG tablet Take 500 mg by mouth daily.    Marland Kitchen atorvastatin (LIPITOR) 10 MG tablet Take 1 tablet (10 mg total) by mouth daily at 6 PM. (Patient not taking: Reported on 12/13/2015) 30 tablet 0   No current facility-administered medications for this visit.    Allergies: Allergies  Allergen Reactions  . Aspirin     sweats  . Codeine   . Iodine     Pt is not aware of this allergy, does not recall much info....//a.c.  . Prednisone     sweats  . Sulfa Antibiotics     Doesn't remember    Social History: The patient  reports that she has never smoked. She has never used smokeless tobacco. She reports that she does not drink alcohol or use illicit drugs.   Family History: The patient's family history includes Stroke in her father.   Review of Systems: Please see the history of present illness.   Otherwise, the review of systems is positive for none.   All other systems are reviewed and negative.   Physical Exam: VS:  BP 130/62 mmHg  Pulse 60  Ht 5\' 2"  (1.575 m)  Wt 136 lb (61.689 kg)  BMI 24.87 kg/m2 .  BMI Body mass index is 24.87 kg/(m^2).  Wt Readings from Last 3 Encounters:  12/13/15 136 lb (61.689 kg)  12/09/15 127 lb 12.8 oz (57.97 kg)  11/28/15 138 lb (62.596 kg)    General: Pleasant. Elderly black female who is alert and in no acute distress.  HEENT: Normal. Neck: Supple, no JVD, carotid bruits, or masses noted.  Cardiac: Regular rate and rhythm. No murmurs, rubs, or gallops. No edema.  Respiratory:  Lungs are clear to auscultation bilaterally with normal work of breathing.  GI: Soft and nontender.  MS: No deformity or atrophy. Gait and ROM intact.  She is using a cane.  Skin: Warm and dry. Color is normal.  Neuro:  Strength and sensation are intact and no gross focal deficits noted.  Psych: Alert, appropriate and with normal affect.   LABORATORY DATA:  EKG:  EKG is not ordered today.  Lab Results  Component Value Date   WBC 5.3 12/09/2015   HGB 12.6  12/09/2015   HCT 37.0 12/09/2015   PLT 211 12/09/2015   GLUCOSE 122* 12/09/2015   CHOL 173 12/10/2015   TRIG 34 12/10/2015   HDL 86 12/10/2015   LDLCALC 80 12/10/2015   ALT 18 12/09/2015   AST 22 12/09/2015   NA 142 12/09/2015   K 3.9 12/09/2015   CL 105 12/09/2015   CREATININE 0.90 12/09/2015   BUN 22* 12/09/2015   CO2 25 12/09/2015   TSH 2.510 06/09/2014   INR 1.12 12/09/2015  HGBA1C 5.7* 12/10/2015    BNP (last 3 results) No results for input(s): BNP in the last 8760 hours.  ProBNP (last 3 results) No results for input(s): PROBNP in the last 8760 hours.   Other Studies Reviewed Today:  Ct Head Wo Contrast 12/09/2015  Atrophy with small vessel chronic ischemic changes of deep cerebral white matter. No acute intracranial abnormalities.  MRA HEAD  12/10/2015  Negative intracranial MRA of the brain. No large or proximal arterial branch occlusion. No high-grade or correctable stenosis.   MRA NECK  12/10/2015  1. No flow limiting stenosis or other acute abnormality within the major arterial vasculature of the neck.  2. Tortuosity of the arterial vasculature of the neck, suggesting chronic underlying hypertension.   Mr Brain Wo Contrast 12/09/2015  Punctate acute infarction in the peripheral left cerebellum and in the peripheral left posterior temporal region, consistent with micro embolic infarctions in the posterior circulation. No large infarction, swelling or hemorrhage. Chronic small vessel ischemic changes elsewhere throughout the brain, similar to the study of 2015.   2D Echocardiogram  - Left ventricle: The cavity size was normal. Wall thickness wasnormal. Systolic function was normal. The estimated ejectionfraction was in the range of 60% to 65%. Wall motion was normal;there were no regional wall motion abnormalities. - Pulmonic valve: There was moderate regurgitation. - Pulmonary arteries: Systolic pressure was mildly increased. PApeak pressure: 32 mm  Hg (S). Impressions: No cardiac source of emboli was indentified. Compared to theprior study, there has been no significant interval change.  Assessment/Plan: 1. Recent CVA - felt to be due to small vessel disease - now on Plavix.   2. HTN - BP ok on current regimen.   3. HLD - was started on statin - did not tolerate - she does not wish to resume at this time. Encouraged her to think about other alternatives.   4. Advanced age  Current medicines are reviewed with the patient today.  The patient does not have concerns regarding medicines other than what has been noted above.  The following changes have been made:  See above.  Labs/ tests ordered today include:   No orders of the defined types were placed in this encounter.     Disposition:   FU with Dr. Radford Pax in 6 months.   Patient is agreeable to this plan and will call if any problems develop in the interim.   Signed: Burtis Junes, RN, ANP-C 12/13/2015 9:26 AM  [facility] [address] [place], [place]  [address] Phone: [phone] Fax: [phone]

## 2015-12-14 ENCOUNTER — Ambulatory Visit (INDEPENDENT_AMBULATORY_CARE_PROVIDER_SITE_OTHER): Payer: Medicare Other | Admitting: Internal Medicine

## 2015-12-14 ENCOUNTER — Other Ambulatory Visit: Payer: Self-pay

## 2015-12-14 ENCOUNTER — Encounter: Payer: Self-pay | Admitting: Internal Medicine

## 2015-12-14 ENCOUNTER — Other Ambulatory Visit: Payer: Self-pay | Admitting: *Deleted

## 2015-12-14 ENCOUNTER — Encounter: Payer: Self-pay | Admitting: *Deleted

## 2015-12-14 VITALS — BP 146/68 | HR 70 | Temp 97.8°F | Resp 14 | Ht 62.0 in | Wt 132.0 lb

## 2015-12-14 DIAGNOSIS — R51 Headache: Secondary | ICD-10-CM

## 2015-12-14 DIAGNOSIS — R519 Headache, unspecified: Secondary | ICD-10-CM | POA: Insufficient documentation

## 2015-12-14 DIAGNOSIS — R42 Dizziness and giddiness: Secondary | ICD-10-CM

## 2015-12-14 DIAGNOSIS — I639 Cerebral infarction, unspecified: Secondary | ICD-10-CM | POA: Diagnosis not present

## 2015-12-14 DIAGNOSIS — F22 Delusional disorders: Secondary | ICD-10-CM

## 2015-12-14 DIAGNOSIS — I10 Essential (primary) hypertension: Secondary | ICD-10-CM

## 2015-12-14 MED ORDER — ATORVASTATIN CALCIUM 10 MG PO TABS: 10.0000 mg | ORAL_TABLET | Freq: Every day | ORAL | Status: DC

## 2015-12-14 MED ORDER — CLOPIDOGREL BISULFATE 75 MG PO TABS: 75.0000 mg | ORAL_TABLET | Freq: Every day | ORAL | Status: DC

## 2015-12-14 NOTE — Patient Outreach (Signed)
12/14/15  Subjective  [name] [name] is an 82 year old female who was referred to pharmacy for a medication reconciliation and to answer a question for her.  She was wondering if her Symbicort and risperidone interacted with each other.    Objective Outpatient Encounter Prescriptions as of 12/14/2015  Medication Sig Note  . acetaminophen (TYLENOL) 500 MG tablet Take 1 tablet (500 mg total) by mouth every 6 (six) hours as needed for moderate pain.   Marland Kitchen albuterol (PROVENTIL HFA;VENTOLIN HFA) 108 (90 BASE) MCG/ACT inhaler Inhale 2 puffs into the lungs every 6 (six) hours as needed for wheezing.   Marland Kitchen alum hydroxide-mag trisilicate (GAVISCON) 00-86 MG CHEW chewable tablet Chew 1 tablet by mouth 3 (three) times daily as needed for flatulence (bowel spasms).   Marland Kitchen amLODipine (NORVASC) 5 MG tablet TAKE 1 TABLET (5 MG TOTAL) BY MOUTH DAILY. 12/13/2015: Received from: External Pharmacy  . atorvastatin (LIPITOR) 10 MG tablet Take 1 tablet (10 mg total) by mouth daily at 6 PM.   . bimatoprost (LUMIGAN) 0.01 % SOLN Place 1 drop into both eyes at bedtime.   . brinzolamide (AZOPT) 1 % ophthalmic suspension Place 1 drop into both eyes 3 (three) times daily.   . Calcium Carbonate-Vitamin D (CALTRATE 600+D) 600-400 MG-UNIT per tablet Take 2 tablets by mouth daily.   . clopidogrel (PLAVIX) 75 MG tablet Take 1 tablet (75 mg total) by mouth daily.   . Coenzyme Q10 (COQ10) 50 MG CAPS Take one tablet by mouth once daily   . fluticasone (FLONASE) 50 MCG/ACT nasal spray Place 1 spray into the nose daily.   . hydrocortisone (ANUSOL-HC) 2.5 % rectal cream Place 1 application rectally 2 (two) times daily.   . Lansoprazole (PREVACID PO) Take by mouth daily.   Marland Kitchen loteprednol (LOTEMAX) 0.5 % ophthalmic suspension Place 1 drop into both eyes daily.    . Multiple Vitamins-Minerals (OCUVITE PRESERVISION PO) Take by mouth. 1 by mouth twice daily   . Multiple Vitamins-Minerals (SENIOR MULTIVITAMIN PLUS) TABS Take by mouth.   . nitroGLYCERIN  (NITROSTAT) 0.4 MG SL tablet Place 1 tablet (0.4 mg total) under the tongue every 5 (five) minutes as needed for chest pain.   . Omega-3 Fatty Acids (FISH OIL) 1200 MG CPDR Take one tablet by mouth once daily   . RESTASIS 0.05 % ophthalmic emulsion Place 1 drop into both eyes 2 (two) times daily.  06/15/2014: Received from: External Pharmacy  . risperiDONE (RISPERDAL) 0.5 MG tablet TAKE 1 TABLET BY MOUTH TWICE A DAY   . senna-docusate (SENOKOT-S) 8.6-50 MG tablet Take 1 tablet by mouth daily.   . SYMBICORT 80-4.5 MCG/ACT inhaler Place 2 puffs into alternate nostrils 2 (two) times daily. 10/19/2014: Received from: External Pharmacy Received Sig:   . vitamin B-12 (CYANOCOBALAMIN) 1000 MCG tablet Take 1,000 mcg by mouth daily.   . vitamin C (ASCORBIC ACID) 500 MG tablet Take 500 mg by mouth daily.    No facility-administered encounter medications on file as of 12/14/2015.   Assessment  Drugs sorted by system:  Neurologic/Psychologic: risperidone,   Cardiovascular: atorvastatin, clopidogrel, nitrostat  Pulmonary/Allergy: albuterol, fluticasone, symbicort  Gastrointestinal: gaviscon, prevacid  Endocrine: none  Renal: anusol  Topical: none  Pain: none  Vitamins/Minerals: calcium carbonate, vitamin D, coenzyme Q10, multivitamin, omega 3 fatty acids, vitamin B12, vitamin C  Infectious Diseases: none  Miscellaneous: bimatorprost, brinzolamide, loteprednol, senna-docusate, restasis   Duplications in therapy:  none Gaps in therapy: none Medications to avoid in the elderly:  none Drug interactions:  none Other issues noted:  none  Plan 1.  No major drug interactions or problems noted. 2.  She was told that Symbicort and risperidone needed to be separated.  I could not find any data to support this recommendation.  I advised that it was ok for them to taken around the same time especially since one was an inhaler and little is absorbed into the blood system. 3.  She is taking her  Prevacid in the morning and her Plavix at night which I told her is fine.   4.  I am going to close her to pharmacy since all her pharmacy needs have been met.  I am happy to assist in the future with any additional pharmacy needs.   Deanne Coffer, PharmD, [place] [facility] [phone]

## 2015-12-14 NOTE — Patient Instructions (Addendum)
Bring copy of [place] and/or Living Will to next appointment.   Check blood pressure daily at least one hour after you've taken your morning medications.  If it is running under 110/60 three different times let us know.   Also, if it's over 160/90 x 3 different readings.  Continue to take plavix at a different time from prevacid.

## 2015-12-14 NOTE — Progress Notes (Signed)
Patient ID: MALKIA PIECHOCKI, female   DOB: August 28, 1924, 82 y.o.   MRN: [id number]   Location:  [facility] clinic Provider: Tayshon Winker [name], D.O., C.M.D.  Code Status: ?full code Goals of Care:  Advanced Directives 12/09/2015  Does patient have an advance directive? Yes  Type of Advance Directive -  Does patient want to make changes to advanced directive? No - Patient declined  Copy of advanced directive(s) in chart? -  Would patient like information on creating an advanced directive? -    Chief Complaint  Patient presents with  . Hospitalization Follow-up    Hospital follow-up ED to admission on 12/09/15, here with daughter Katharine Look. Patient w/o dizziness since hospital release, patient c/o headache off/on .    HPI: Patient is a 82 y.o. female seen today for hospital follow-up s/p admission from 12/09/15 to 12/11/15 with dizziness--turns out she had a posterior circulation stroke from small vessel disease.    No longer dizzy, but headaches are coming and going.  They are along her left side.    Was having troublesome eye also.  Uses a drop in that eye as needed.  When ached she would put an eyedrop in and it would get better. No visual changes.    She is taking her prevacid in the evening, but the plavix in the morning which her pharmacist advised was acceptable.    Blood pressure:  Has been at goal.  Discussed checking bp an hour after taking her meds daily.  Past Medical History  Diagnosis Date  . Benign essential hypertension   . Osteoarthritis   . GERD (gastroesophageal reflux disease)   . Seasonal allergies   . Asthma   . Glaucoma   . PVD (peripheral vascular disease) ([place])   . Anxiety     Past Surgical History  Procedure Laterality Date  . Abdominal hysterectomy  1981  . Tonsillectomy  1953    Allergies  Allergen Reactions  . Aspirin     sweats  . Codeine   . Iodine     Pt is not aware of this allergy, does not recall much info....//a.c.  . Prednisone     sweats  . Sulfa  Antibiotics     Doesn't remember      Medication List       This list is accurate as of: 12/14/15  7:44 AM.  Always use your most recent med list.               acetaminophen 500 MG tablet  Commonly known as:  TYLENOL  Take 1 tablet (500 mg total) by mouth every 6 (six) hours as needed for moderate pain.     albuterol 108 (90 Base) MCG/ACT inhaler  Commonly known as:  PROVENTIL HFA;VENTOLIN HFA  Inhale 2 puffs into the lungs every 6 (six) hours as needed for wheezing.     alum hydroxide-mag trisilicate [id number] MG Chew chewable tablet  Commonly known as:  GAVISCON  Chew 1 tablet by mouth 3 (three) times daily as needed for flatulence (bowel spasms).     amLODipine 5 MG tablet  Commonly known as:  NORVASC  TAKE 1 TABLET (5 MG TOTAL) BY MOUTH DAILY.     bimatoprost 0.01 % Soln  Commonly known as:  LUMIGAN  Place 1 drop into both eyes at bedtime.     brinzolamide 1 % ophthalmic suspension  Commonly known as:  AZOPT  Place 1 drop into both eyes 3 (three) times daily.  CALTRATE 600+D 600-400 MG-UNIT tablet  Generic drug:  Calcium Carbonate-Vitamin D  Take 2 tablets by mouth daily.     clopidogrel 75 MG tablet  Commonly known as:  PLAVIX  Take 1 tablet (75 mg total) by mouth daily.     CoQ10 50 MG Caps  Take one tablet by mouth once daily     Fish Oil 1200 MG Cpdr  Take one tablet by mouth once daily     fluticasone 50 MCG/ACT nasal spray  Commonly known as:  FLONASE  Place 1 spray into the nose daily.     hydrocortisone 2.5 % rectal cream  Commonly known as:  ANUSOL-HC  Place 1 application rectally 2 (two) times daily.     loteprednol 0.5 % ophthalmic suspension  Commonly known as:  LOTEMAX  Place 1 drop into both eyes daily.     nitroGLYCERIN 0.4 MG SL tablet  Commonly known as:  NITROSTAT  Place 1 tablet (0.4 mg total) under the tongue every 5 (five) minutes as needed for chest pain.     PREVACID PO  Take by mouth daily.     RESTASIS 0.05 % ophthalmic  emulsion  Generic drug:  cycloSPORINE  Place 1 drop into both eyes 2 (two) times daily.     risperiDONE 0.5 MG tablet  Commonly known as:  RISPERDAL  TAKE 1 TABLET BY MOUTH TWICE A DAY     OCUVITE PRESERVISION PO  Take by mouth. 1 by mouth twice daily     SENIOR MULTIVITAMIN PLUS Tabs  Take by mouth.     senna-docusate 8.6-50 MG tablet  Commonly known as:  Senokot-S  Take 1 tablet by mouth daily.     SYMBICORT 80-4.5 MCG/ACT inhaler  Generic drug:  budesonide-formoterol  Place 2 puffs into alternate nostrils 2 (two) times daily.     vitamin B-12 1000 MCG tablet  Commonly known as:  CYANOCOBALAMIN  Take 1,000 mcg by mouth daily.     vitamin C 500 MG tablet  Commonly known as:  ASCORBIC ACID  Take 500 mg by mouth daily.        Review of Systems:  Review of Systems  Constitutional: Negative for fever, chills and malaise/fatigue.  HENT: Negative for congestion.   Eyes: Positive for blurred vision and pain.       Unchanged  Respiratory: Negative for shortness of breath.   Cardiovascular: Negative for chest pain and palpitations.  Gastrointestinal: Negative for abdominal pain.  Genitourinary: Negative for dysuria.  Musculoskeletal: Negative for myalgias and falls.  Neurological: Positive for headaches. Negative for dizziness, tingling, tremors, sensory change, speech change, focal weakness, seizures, loss of consciousness and weakness.  Psychiatric/Behavioral: Positive for hallucinations. Negative for depression and memory loss.    Health Maintenance  Topic Date Due  . MAMMOGRAM  10/07/2018 (Originally 09/28/2009)  . ZOSTAVAX  10/07/2018 (Originally 10/04/1986)  . TETANUS/TDAP  10/07/2018 (Originally 10/04/1945)  . INFLUENZA VACCINE  05/07/2016  . DEXA SCAN  Completed  . PNA vac Low Risk Adult  Completed    Physical Exam: Filed Vitals:   12/14/15 0740  Height: 5\' 2"  (1.575 m)  Weight: 132 lb (59.875 kg)   Body mass index is 24.14 kg/(m^2). Physical Exam    Constitutional: She is oriented to person, place, and time. She appears well-developed and well-nourished. No distress.  Eyes: EOM are normal. Pupils are equal, round, and reactive to light.  Neck: Neck supple. No JVD present.  Cardiovascular: Normal rate, regular rhythm, normal heart sounds  and intact distal pulses.   Wearing compression hose  Pulmonary/Chest: Effort normal and breath sounds normal. No respiratory distress. She has no wheezes.  Musculoskeletal: Normal range of motion.  Uses cane for balance  Neurological: She is alert and oriented to person, place, and time. She displays normal reflexes. No cranial nerve deficit. She exhibits normal muscle tone. Coordination normal.  Skin: Skin is warm and dry.  Psychiatric: She has a normal mood and affect.    Labs reviewed: Basic Metabolic Panel:  Recent Labs  01/10/15 1059 03/20/15 1517 12/09/15 1414 12/09/15 1421  NA 144 143 142 142  K 4.0 4.2 3.9 3.9  CL 105 105 108 105  CO2 23 23 25   --   GLUCOSE 120* 103* 122* 122*  BUN 20 20 20  22*  CREATININE 1.01* 0.82 0.80 0.90  CALCIUM 9.6 9.3 9.3  --    Liver Function Tests:  Recent Labs  03/20/15 1517 12/09/15 1414  AST 17 22  ALT 14 18  ALKPHOS 64 63  BILITOT 0.7 0.9  PROT 6.4 6.6  ALBUMIN 4.2 3.7   No results for input(s): LIPASE, AMYLASE in the last 8760 hours. No results for input(s): AMMONIA in the last 8760 hours. CBC:  Recent Labs  01/10/15 1059 03/20/15 1517 12/09/15 1414 12/09/15 1421  WBC 5.2 5.2 5.3  --   NEUTROABS 3.0 3.1 3.1  --   HGB 12.1  --  11.5* 12.6  HCT 36.3 32.8* 33.7* 37.0  MCV 91 91 90.1  --   PLT 256 235 211  --    Lipid Panel:  Recent Labs  12/10/15 0549  CHOL 173  HDL 86  LDLCALC 80  TRIG 34  CHOLHDL 2.0   Lab Results  Component Value Date   HGBA1C 5.7* 12/10/2015    Procedures since last visit: Ct Head Wo Contrast  12/09/2015  CLINICAL DATA:  Intermittent dizzy spells, one on Wednesday and another today, some  shortness of breath, history asthma, peripheral vascular disease, GERD EXAM: CT HEAD WITHOUT CONTRAST TECHNIQUE: Contiguous axial images were obtained from the base of the skull through the vertex without intravenous contrast. COMPARISON:  None. FINDINGS: Generalized atrophy. Normal ventricular morphology. No midline shift or mass effect. Small vessel chronic ischemic changes of deep cerebral white matter. No intracranial hemorrhage, mass lesion, or acute infarction. Visualized paranasal sinuses and mastoid air cells clear. Bones unremarkable. Atherosclerotic calcifications at the carotid siphons. IMPRESSION: Atrophy with small vessel chronic ischemic changes of deep cerebral white matter. No acute intracranial abnormalities. Electronically Signed   By: Lavonia Dana M.D.   On: 12/09/2015 15:15   Mr Jodene Nam Head Wo Contrast  12/10/2015  CLINICAL DATA:  Initial evaluation for stroke. EXAM: MRA NECK WITHOUT AND WITH CONTRAST MRA HEAD WITHOUT CONTRAST TECHNIQUE: Multiplanar and multiecho pulse sequences of the neck were obtained without and with intravenous contrast. Angiographic images of the neck were obtained using MRA technique without and with intravenous contrast.; Angiographic images of the Circle of Willis were obtained using MRA technique without intravenous contrast. CONTRAST:  99mL MULTIHANCE GADOBENATE DIMEGLUMINE 529 MG/ML IV SOLN COMPARISON:  Prior MRI from 12/09/2015. FINDINGS: MRA NECK FINDINGS Visualized aortic arch is of normal caliber with normal branch pattern. Visualized subclavian arteries widely patent. No high-grade stenosis seen at the origin of the great vessels. Right common carotid artery tortuous proximally but widely patent to the bifurcation. No significant atheromatous plaque about the bifurcation. Right ICA widely patent from the bifurcation to the skullbase. Right ICA is  mildly tortuous. No high-grade stenosis, dissection, or vascular occlusion within the right carotid artery system. Left  common carotid artery patent from its origin to the bifurcation. Left common carotid artery mildly tortuous proximally. No significant atheromatous plaque appreciated about the left carotid bifurcation. Left ICA patent from the bifurcation to the skullbase. Left ICA mildly tortuous. No high-grade stenosis, dissection, or vascular occlusion within the left carotid artery system. Vertebral arteries patent from the origin to the skullbase without evidence for dissection, occlusion, or stenosis. Vertebral arteries are tortuous bilaterally. Please note that the origin of the vertebral arteries not well evaluated on this exam. MRA HEAD FINDINGS ANTERIOR CIRCULATION: Visualized distal cervical segments of the internal carotid arteries are mildly tortuous but widely patent with antegrade flow. Petrous, cavernous, and supraclinoid segments patent without high-grade stenosis or occlusion. Mild atheromatous irregularity within the cavernous/ supraclinoid ICAs bilaterally. A1 segments patent. Anterior communicating artery normal. Anterior cerebral arteries well opacified to their distal aspects. M1 segments patent without stenosis or occlusion. MCA bifurcations within normal limits. No proximal M2 branch occlusion. Visualized distal MCA branches symmetric and well opacified bilaterally. POSTERIOR CIRCULATION: Vertebral arteries are patent to the vertebrobasilar junction. Right vertebral artery is dominant. Posterior inferior cerebral arteries not well evaluated on this exam. Mild attenuation of the proximal basilar artery favored to be related to motion on this study. Basilar artery otherwise well opacified to its distal aspect. Superior cerebral arteries patent bilaterally. Both posterior cerebral arteries arise from the basilar artery and are well opacified to their distal aspects. No aneurysm or vascular malformation within the intracranial circulation. IMPRESSION: MRA HEAD IMPRESSION: Negative intracranial MRA of the brain.  No large or proximal arterial branch occlusion. No high-grade or correctable stenosis. MRA NECK IMPRESSION: 1. No flow limiting stenosis or other acute abnormality within the major arterial vasculature of the neck. 2. Tortuosity of the arterial vasculature of the neck, suggesting chronic underlying hypertension. Electronically Signed   By: Jeannine Boga M.D.   On: 12/10/2015 06:06   Mr Angiogram Neck W Wo Contrast  12/10/2015  CLINICAL DATA:  Initial evaluation for stroke. EXAM: MRA NECK WITHOUT AND WITH CONTRAST MRA HEAD WITHOUT CONTRAST TECHNIQUE: Multiplanar and multiecho pulse sequences of the neck were obtained without and with intravenous contrast. Angiographic images of the neck were obtained using MRA technique without and with intravenous contrast.; Angiographic images of the Circle of Willis were obtained using MRA technique without intravenous contrast. CONTRAST:  61mL MULTIHANCE GADOBENATE DIMEGLUMINE 529 MG/ML IV SOLN COMPARISON:  Prior MRI from 12/09/2015. FINDINGS: MRA NECK FINDINGS Visualized aortic arch is of normal caliber with normal branch pattern. Visualized subclavian arteries widely patent. No high-grade stenosis seen at the origin of the great vessels. Right common carotid artery tortuous proximally but widely patent to the bifurcation. No significant atheromatous plaque about the bifurcation. Right ICA widely patent from the bifurcation to the skullbase. Right ICA is mildly tortuous. No high-grade stenosis, dissection, or vascular occlusion within the right carotid artery system. Left common carotid artery patent from its origin to the bifurcation. Left common carotid artery mildly tortuous proximally. No significant atheromatous plaque appreciated about the left carotid bifurcation. Left ICA patent from the bifurcation to the skullbase. Left ICA mildly tortuous. No high-grade stenosis, dissection, or vascular occlusion within the left carotid artery system. Vertebral arteries  patent from the origin to the skullbase without evidence for dissection, occlusion, or stenosis. Vertebral arteries are tortuous bilaterally. Please note that the origin of the vertebral arteries not well evaluated on  this exam. MRA HEAD FINDINGS ANTERIOR CIRCULATION: Visualized distal cervical segments of the internal carotid arteries are mildly tortuous but widely patent with antegrade flow. Petrous, cavernous, and supraclinoid segments patent without high-grade stenosis or occlusion. Mild atheromatous irregularity within the cavernous/ supraclinoid ICAs bilaterally. A1 segments patent. Anterior communicating artery normal. Anterior cerebral arteries well opacified to their distal aspects. M1 segments patent without stenosis or occlusion. MCA bifurcations within normal limits. No proximal M2 branch occlusion. Visualized distal MCA branches symmetric and well opacified bilaterally. POSTERIOR CIRCULATION: Vertebral arteries are patent to the vertebrobasilar junction. Right vertebral artery is dominant. Posterior inferior cerebral arteries not well evaluated on this exam. Mild attenuation of the proximal basilar artery favored to be related to motion on this study. Basilar artery otherwise well opacified to its distal aspect. Superior cerebral arteries patent bilaterally. Both posterior cerebral arteries arise from the basilar artery and are well opacified to their distal aspects. No aneurysm or vascular malformation within the intracranial circulation. IMPRESSION: MRA HEAD IMPRESSION: Negative intracranial MRA of the brain. No large or proximal arterial branch occlusion. No high-grade or correctable stenosis. MRA NECK IMPRESSION: 1. No flow limiting stenosis or other acute abnormality within the major arterial vasculature of the neck. 2. Tortuosity of the arterial vasculature of the neck, suggesting chronic underlying hypertension. Electronically Signed   By: Jeannine Boga M.D.   On: 12/10/2015 06:06   Mr  Brain Wo Contrast  12/09/2015  CLINICAL DATA:  Intermittent dizzy spells, most recent 1 earlier today. EXAM: MRI HEAD WITHOUT CONTRAST TECHNIQUE: Multiplanar, multiecho pulse sequences of the brain and surrounding structures were obtained without intravenous contrast. COMPARISON:  CT same day.  MRI 06/10/2014. FINDINGS: Diffusion imaging shows a punctate acute infarction in the periphery of the inferior cerebellum on the left. There is a 4 mm acute infarction at the right posterior temporal cortex. Both of these are consistent with micro embolic infarctions in the posterior circulation. No sign of swelling or hemorrhage. There chronic small-vessel ischemic changes throughout the pons. Cerebral hemispheres elsewhere show chronic small-vessel ischemic changes within the deep and subcortical white matter. No large vessel territory infarction. No mass lesion, hemorrhage, hydrocephalus or extra-axial collection. No pituitary mass. No significant sinus disease. IMPRESSION: Punctate acute infarction in the peripheral left cerebellum and in the peripheral left posterior temporal region, consistent with micro embolic infarctions in the posterior circulation. No large infarction, swelling or hemorrhage. Chronic small vessel ischemic changes elsewhere throughout the brain, similar to the study of 2015. Electronically Signed   By: Nelson Chimes M.D.   On: 12/09/2015 17:02    Assessment/Plan 1. Acute CVA (cerebrovascular accident) ([place]) -acute ischemic left posterior cerebellar and temporal punctate strokes seen on MRI -only residual symptom now is headaches in left temporal region that are coming and going -encouraged her to cont to take lipitor 10mg  and plavix  -expect she may call back with side effects from lipitor (said it caused stomach upset, but I suspect that was a different medication given with it at the hospital)  2. Benign essential hypertension -bp is satisfactory--cont current regimen -parameters  provided in instructions for when pt to call (if too low <100/60 or high >160/90 x 3 readings)  3. Paranoid type delusional disorder ([place]) -continues on low dose risperdal--she was not eating or drinking due to fears of people poisoning her -seems this has been better on the risperdal  4. Dizziness -due to #1 (cerebellar stroke) -educated pt and her goddaughter on this today--they seem to understand why  she did not present typically and will return to the ED if these symptoms recur as they were the first episode  5. Nonintractable episodic headache, unspecified headache type -due to her stroke, cont current secondary prevention -let us know if this worsens  Labs/tests ordered:  No new Next appt:  01/22/2016 regular visit  Kennesha Brewbaker L. Dennard Vezina, D.O. [facility] [address], [place] [address] Cell Phone (Mon-Fri 8am-5pm):  [phone] On Call:  [phone] & follow prompts after 5pm & weekends Office Phone:  [phone] Office Fax:  [phone]

## 2015-12-14 NOTE — Patient Outreach (Signed)
[facility]) Care Management  12/14/2015  [name] [name] 15-Aug-1924 [id number]  Subjective: Telephone call to patient's home number, spoke with patient, and HIPAA verified.   Patient gave verbal authorization for [facility] to speak with children Katharine Look [place] and Jewel Baize) regarding her healthcare needs as needed.   Discussed [facility] EMMI Program and patient in agreement to continue to receive services.    States she is doing much better and received a good report from her primary MD visit today with Dr. Hollace Kinnier. Patient states she ambulates with a straight cane and has a tub seat without a back.  States her MD was able to answer her questions about the new medications and still has questions regarding drug interactions with other medications.  Patient states she can afford her medications.   Patient in agreement to a referral to [facility] for medication review and questions regarding drug interactions.   Patient states the only symptom she had of stroke was dizziness and asked what she should do if she feels that way again.   RNCM advised patient to call 911 if previous stroke symptoms worsen and educated patient on other signs and symptoms of stroke.   Patient voices understanding and is in agreement.  Patient states she would like some educational materials on low sodium foods and how to read food labels.  Patient states she has no transportation needs at this time.  Patient states she has a history of hypertension, is planning to start taking and recording her blood pressures daily per MD's request .  Patient states she has a blood pressure cuff and is able to take blood pressure as needed.   Telephone call to Deanne Coffer at [facility] advised of urgent referral and spoke someone will reach out to patient today. Patient in agreement to continue to receive [facility] EMMI Stroke program services.   Patient given RNCM's contact number, [place] phone number, and 24 hour Nurse Advice line  phone number for future reference.    Objective:  Per Epic case review: Patient hospitalized 12/09/15 - 12/11/15  for CVA.   Patient's last appointment with cardiologist provider Truitt Merle NP was on 12/13/15.     Assessment:  Received EMMI Stroke red flag alert on 12/14/15.   Report date: 12/13/15.   Telephonic RNCM will follow patient for next 30 days for [facility] Stroke program.     Telephonic RNCM EMMI Stroke program end date: 01/14/16.    Plan:  RNCM will refer patient to [facility] for medication review and questions regarding drug interactions.  RNCM will send patient educational handout on low sodium foods and how to read food labels.   RNCM will call patient within 1 week for EMMI Stroke program follow up.   RNCM will follow up with patient within 1 week , on low sodium educational material receipt and see if patient has any questions.     Nissim Fleischer H. Annia Friendly, BSN, [facility] Telephonic CM Phone: [phone] Fax: [phone]

## 2015-12-19 DIAGNOSIS — I69398 Other sequelae of cerebral infarction: Secondary | ICD-10-CM | POA: Diagnosis not present

## 2015-12-19 DIAGNOSIS — M6281 Muscle weakness (generalized): Secondary | ICD-10-CM | POA: Diagnosis not present

## 2015-12-19 DIAGNOSIS — I739 Peripheral vascular disease, unspecified: Secondary | ICD-10-CM | POA: Diagnosis not present

## 2015-12-19 DIAGNOSIS — I10 Essential (primary) hypertension: Secondary | ICD-10-CM | POA: Diagnosis not present

## 2015-12-19 DIAGNOSIS — J45909 Unspecified asthma, uncomplicated: Secondary | ICD-10-CM | POA: Diagnosis not present

## 2015-12-19 DIAGNOSIS — F419 Anxiety disorder, unspecified: Secondary | ICD-10-CM | POA: Diagnosis not present

## 2015-12-21 ENCOUNTER — Telehealth: Payer: Self-pay | Admitting: *Deleted

## 2015-12-21 ENCOUNTER — Other Ambulatory Visit: Payer: Self-pay | Admitting: *Deleted

## 2015-12-21 DIAGNOSIS — I69398 Other sequelae of cerebral infarction: Secondary | ICD-10-CM | POA: Diagnosis not present

## 2015-12-21 DIAGNOSIS — I739 Peripheral vascular disease, unspecified: Secondary | ICD-10-CM | POA: Diagnosis not present

## 2015-12-21 DIAGNOSIS — I10 Essential (primary) hypertension: Secondary | ICD-10-CM | POA: Diagnosis not present

## 2015-12-21 DIAGNOSIS — F419 Anxiety disorder, unspecified: Secondary | ICD-10-CM | POA: Diagnosis not present

## 2015-12-21 DIAGNOSIS — J45909 Unspecified asthma, uncomplicated: Secondary | ICD-10-CM | POA: Diagnosis not present

## 2015-12-21 DIAGNOSIS — M6281 Muscle weakness (generalized): Secondary | ICD-10-CM | POA: Diagnosis not present

## 2015-12-21 NOTE — Telephone Encounter (Signed)
Increase amlodipine to 10mg  #30 take 1 po daily with 3RF and f/u with Dr Mariea Clonts as scheduled. Call office if BP remains >150/90 after increasing amlodipine

## 2015-12-21 NOTE — Telephone Encounter (Signed)
Patient called and stated that she has been having fluctuating BP and you told her to call 168/80 for a couple days. And one day it was 183/78. Patient has been taking medications and take BP an hour after taking medications.  12/16/15-162/71 12/19/15-162/78 12/19/15-183/78 12/20/15-178/79 12/21/15-168/80  Please Advise.

## 2015-12-21 NOTE — Patient Outreach (Signed)
[name]) Care Management  12/21/2015  [name] [name] 04-Sep-1924 [id number]  Subjective: Telephone phone call to patient's home number, spoke with patient, and HIPAA verified.   Patient states she things are going well,  but blood pressure is running a little high, and she feels like something is wrong.   States she does not have a headache and no dizziness.  States she has taken her blood pressure medication as prescribed today and took her blood pressure approximately 1 hour after medication and it was 164/76.   Patient advised RNCM of her blood pressure readings for the last 2 days and reviewed periodic readings from the last week.  Per patient, blood pressures for 12/20/15 were 168/80, 160/76, 153/74 and on 12/19/15 were 162/78, 167/67, 156/78.    Patient states [name] advised patient to call her if blood pressure was lower than 110/60 or higher than 160/90.    RNCM advised patient to call Dr. Cyndi Lennert office, report blood pressure reading for the last week, give MD an update on how she is feeling, clarify if both systolic and diastolic numbers were to be above or below the parameters in order for her to call MD regarding blood pressure readings.  Patient voiced understanding and is in agreement to call [name].     Patient will give RNCM update on outcome of call to MD's office during next RNCM follow up call.   Patient states she received the diet education materials from [facility], found it to be a great reminder,  and did not have questions.  States she does not have any further educational needs at this time.   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  Patient states she did receive a call from [facility] Pharmacist, very pleased with the information, and glad for the information on foods to avoid with certain medications.   Patient states she is alone, her daughter has returned home to [facility] and her son is local in [place]  approximately 10 minutes from her house.  States her children are available if she needs anything.  States she is now receiving physical therapy through [facility], for 2 days per week, every Tuesday, and Thursday.   Patient in agreement to continue to receive [facility] EMMI Stroke program services.   Objective: Per Epic case review: Patient hospitalized 12/09/15 - 12/11/15 for CVA. Patient's last appointment with cardiologist provider Truitt Merle NP was on 12/13/15.   Assessment: Received EMMI Stroke red flag alert on 12/14/15. Report date: 12/13/15. Telephonic RNCM will follow patient for next 30 days for [facility] Stroke program. Telephonic RNCM EMMI Stroke program end date: 01/14/16.   Plan: RNCM will call patient within 1 week for EMMI Stroke program follow up.  RNCM will follow up with patient within 1 week to obtain update on outcome of call to MD's office regarding patient status and blood pressure readings over the previous week.    [name] [name] H. Annia Friendly, BSN, [place] Telephonic CM Phone: [phone] Fax: [phone]

## 2015-12-22 MED ORDER — AMLODIPINE BESYLATE 5 MG PO TABS: ORAL_TABLET | ORAL | Status: DC

## 2015-12-22 NOTE — Telephone Encounter (Signed)
Patient notified and agreed. Medication list updated and patient wants to take 2 tablets of the 5mg  for now.

## 2015-12-26 DIAGNOSIS — M6281 Muscle weakness (generalized): Secondary | ICD-10-CM | POA: Diagnosis not present

## 2015-12-26 DIAGNOSIS — F419 Anxiety disorder, unspecified: Secondary | ICD-10-CM | POA: Diagnosis not present

## 2015-12-26 DIAGNOSIS — J45909 Unspecified asthma, uncomplicated: Secondary | ICD-10-CM | POA: Diagnosis not present

## 2015-12-26 DIAGNOSIS — I69398 Other sequelae of cerebral infarction: Secondary | ICD-10-CM | POA: Diagnosis not present

## 2015-12-26 DIAGNOSIS — I739 Peripheral vascular disease, unspecified: Secondary | ICD-10-CM | POA: Diagnosis not present

## 2015-12-26 DIAGNOSIS — I10 Essential (primary) hypertension: Secondary | ICD-10-CM | POA: Diagnosis not present

## 2015-12-27 MED ORDER — NEBIVOLOL HCL 5 MG PO TABS: 5.0000 mg | ORAL_TABLET | Freq: Every day | ORAL | Status: DC

## 2015-12-27 MED ORDER — AMLODIPINE BESYLATE 5 MG PO TABS: ORAL_TABLET | ORAL | Status: DC

## 2015-12-27 NOTE — Telephone Encounter (Signed)
Let's continue with just 5mg  of amlodipine since she did not tolerate the 10mg  per the previous note.      Then add bystolic 5mg  daily. Send to her pharmacy #[id number], 3 refills.    Cont BP checks as she's doing and call back again if in 3 days bps are still not staying under 150/90.        ----- Message -----     From: Albertina Senegal Dhwani Venkatesh, CMA     Sent: 12/27/2015  9:57 AM      To: Gayland Curry, DO     Patient notified and agreed. Medication list updated and faxed Rx to pharmacy.

## 2015-12-27 NOTE — Addendum Note (Signed)
Addended by: Rafael Bihari A on: 12/27/2015 03:10 PM   Modules accepted: Orders

## 2015-12-27 NOTE — Telephone Encounter (Signed)
Patient called today and stated that she has increased her Amlodipine to 10mg  and now it is making her sick. Making her feel weak. She only wants to go from the chair to the bed. BP has been better but she has no energy. Started taking the increase on 12/22/15. Please Advise.   12/25/15-155/74 148/73  144/70 12/26/15-172/80 165/83  151/75 12/27/15-179/74 163/72  154/71

## 2015-12-28 ENCOUNTER — Encounter: Payer: Self-pay | Admitting: *Deleted

## 2015-12-28 ENCOUNTER — Other Ambulatory Visit: Payer: Self-pay | Admitting: *Deleted

## 2015-12-28 ENCOUNTER — Ambulatory Visit: Payer: Self-pay | Admitting: *Deleted

## 2015-12-28 DIAGNOSIS — F419 Anxiety disorder, unspecified: Secondary | ICD-10-CM | POA: Diagnosis not present

## 2015-12-28 DIAGNOSIS — M6281 Muscle weakness (generalized): Secondary | ICD-10-CM | POA: Diagnosis not present

## 2015-12-28 DIAGNOSIS — J45909 Unspecified asthma, uncomplicated: Secondary | ICD-10-CM | POA: Diagnosis not present

## 2015-12-28 DIAGNOSIS — I739 Peripheral vascular disease, unspecified: Secondary | ICD-10-CM | POA: Diagnosis not present

## 2015-12-28 DIAGNOSIS — I10 Essential (primary) hypertension: Secondary | ICD-10-CM | POA: Diagnosis not present

## 2015-12-28 DIAGNOSIS — I69398 Other sequelae of cerebral infarction: Secondary | ICD-10-CM | POA: Diagnosis not present

## 2015-12-28 NOTE — Patient Outreach (Addendum)
[facility]) Care Management  12/28/2015  [name] [name] [birth year] [id number]  Subjective: Telephone phone call to patient's home number, spoke with patient, and HIPAA verified. Patient states she is doing good and blood pressure is doing better. States she does not have a headache and no dizziness.  Patient states she takes her blood pressure medication in the morning.  States she has taken her blood pressure medication as prescribed today, took blood pressure before medication, and took her blood pressure approximately 1 hour after medication.  States she has not started the new medication (Bystolic) that the MD called into the pharmacy on 12/27/15 because the pharmacy did not have the medication and they are suppose to call her when the prescription has been filled. RNCM advised patient to follow up with pharmacy ([place], [place]) today to verify status of medication fill and ask her question regarding when taking 2 blood pressures medication can they be taken together.   Patient voiced understanding and is in agreement to call pharmacy.  Patient will call RNCM if pharmacy not able to answer her questions.  Per patient, 3 takes her blood pressure 3 times prior to blood medication administration and 3 times 1 hour after blood pressure medication administration.   Per patient, blood pressures for 12/28/15 prior to medication were 178/77, 169/75,165/72 and on 12/28/15 1 hour after medication  were 168/78, 155/77, 161/75. Patient states [name] advised patient to call her if blood pressure was not lower than 150/ 90 after 3 days of the new blood pressure medications regimen (bisytolic and amlodipine).  Patient states she is aware of signs and symptoms to report to MD.    Patient states Dr. Cyndi Lennert office, did not clarify if both systolic and diastolic numbers were to be above or below the parameters in order for her to call MD regarding blood pressure  readings. Patient states she will clarify parameters with MD's office in the future if blood pressures do not improve. Patient in agreement to receive [facility] Patient Calender to assist with blood pressure recording for MD's office report and patient monitoring.  Patient states she is still receiving physical therapy through [facility], for 2 days per week, every Tuesday, and Thursday.  States she will ask therapist today, how much longer she will receive therapy and report to [facility] at next follow up call.  Patient in agreement to continue to receive [facility] EMMI Stroke program services.  Telephone call to Deanne Coffer at [facility] (Pharmacist)  left HIPAA compliant voice mail message, requesting call back.   Telephone call from Deanne Coffer at [facility] (Pharmacist) regarding patient's question regarding what time of day to take medications if you are taking 2 blood pressure meds.   RNCM will advise patient of [place] update on next patient outreach. Telephone call to patient's home number, spoke with patient, and HIPAA verified.   RNCM advised patient of RNCM's conversation with [facility] Pharmacist regarding her question.   Patient states she had forgot to call the pharmacy ([facility]) regarding her questions and will call after call completed with RNCM.   Patient states she had started taking her blood pressure 3 times at a time when she had blood pressure problems in the past because someone told her too.   RNCM educated patient on how taking blood pressure reading multiple times without rest can cause blood pressure fluctuations.  Patient voices understanding.    Patient states Dr.  Reed did not tell her to take it 3 times in row and she will resume taking it 1 time prior to medication and 1 time 1 hour after medication unless she receive an incorrect blood pressure reading or is instructed to by MD.    Telephone call from patient, states she spoke with  [facility] and they stated her medication should be ready by 2:30pm today.   States she will ask the pharmacist her other question about when to take the medication when she picks it up this afternoon. Patient will continue to receive [facility] EMMI Stroke program services.   Objective: Per Epic case review: Patient hospitalized 12/09/15 - 12/11/15 for CVA. Patient's last appointment with cardiologist provider Truitt Merle NP was on 12/13/15. Per MD's office notes, patient prescribed Bystolic 5mg  po daily on 12/27/15 and decreased Amlodipine 5 mg po daily instead of 10mg  po daily.  Patient to call MD if blood pressure not under 150/90 within 3 days.   Assessment: Received EMMI Stroke red flag alert on 12/14/15. Report date: 12/13/15. Telephonic RNCM will follow patient for next 30 days for [facility] Stroke program if needed. Telephonic RNCM EMMI Stroke program end date: 01/14/16.  RNCM will assess EMMI Stroke program end date on next patient outreach and need for hypertension disease management.    Plan: RNCM will send patient [facility] Patient Calendar.  RNCM will call patient within 1 week for EMMI Stroke program follow up.  RNCM will follow up with patient within 1 week to obtain update regarding patient status and blood pressure readings over the previous week.  RNCM will follow up with patient regarding clarification of blood pressure parameters and when to notify MD's office within 1 week.  RNCM will follow up with patient regarding outcome of blood pressure medication question to local pharmacy within 1 week.  [name], BSN, [place] [facility] Telephonic CM Phone: [phone] Fax: [phone]

## 2016-01-01 ENCOUNTER — Other Ambulatory Visit: Payer: Self-pay | Admitting: *Deleted

## 2016-01-01 ENCOUNTER — Telehealth: Payer: Self-pay

## 2016-01-01 NOTE — Patient Outreach (Signed)
[facility]) Care Management  01/01/2016  [name] [name] 1923/11/08 [id number]   Subjective: Telephone call to patient's home number, spoke with patient, and HIPAA verified.   States she is doing well today and her pulse is lower since starting the bystolic medication on [id number].  States she picked up her medication from [place] on 12/28/15 and started it on 12/29/15.    States she feels fine, no headache, no dizziness and no weakness.   States she has already called Dr. Cyndi Lennert office this morning around 9:00am and is waiting for a call back.   States her pulse range has been 53 - 48 since starting the new medication on 12/29/15.  States her pulse rate prior to new medication was in the upper 60's.    States her blood pressure this morning prior to medication was 174/76 and pulse was 51.   States she has not taken the post medication blood pressure this morning because she was waiting for a call back from the MD and it has not been an hour since she took the medications.  RNCM advised patient to take post medication reading within the hour of the medication even if no call back from MD.   Patient voices understanding and states she is in agreement.   Patient aware her systolic reading is above the goal of 150 and will report to MD.  Patient's blood pressure goal is currently 150/ 90.    Patient states her blood pressure on 12/29/15 was 172/70.   Patient states she has to answer the other line,  MD's office is calling on the other line and requested RNCM call her back. Telephone call from patient and HIPAA verified.   States the MD's office called to get all the blood pressures reading and pulse readings for the last couple of days, will discuss with [name] and call patient back with further instructions.  Patient states she received 2 [facility] Patient Calendars and is very Patent attorney.  States the calendar will help her to keep organized.  Patient states she wished to receive [facility] HTN Care  Management program telephonically at this time.  Patient in agreement to implement HTN program on the next patient outreach call.   Patient states she does not want a community RNCM at this time since she already has physical therapy coming into her home.   States physical therapy is still continuing to come 2 times a week and is not sure how much longer they will be coming out.   States physical therapist will continue to assess patient's goal status during next couple of visits.  Patient advised RNCM blood pressure readings since 12/29/15 and patient's range was 126/54 - 178/78.   Patient had the following readings:  On 12/29/15 reading after blood pressure medications was 172/72, pulse 53 and patient did not take readings prior to medications.   On 12/30/15 reading after blood pressure medications was 170/76 and patient did not take readings prior to medications.  On 12/31/15 reading after blood pressure medications was 126/54, pulse 47, and reading prior to medications was 170/76.   RNCM advised patient to clarify if she needs to call MD for diastolic or systolic pressure out of range with MD's office.   Patient voices understanding and is in agreement to ask MD this question during next outreach follow up.  Patient in agreement to continue to receive [facility] EMMI Stroke services and implement [facility] HTN Care Management program.    Objective: Per Epic  case review: Patient hospitalized 12/09/15 - 12/11/15 for CVA. Patient's last appointment with cardiologist provider Truitt Merle NP was on 12/13/15. Per MD's office notes, patient prescribed Bystolic 5mg  po daily on 12/27/15 and decreased Amlodipine 5 mg po daily instead of 10mg  po daily. Patient to call MD if blood pressure not under 150/90 within 3 days.   Assessment: Received EMMI Stroke red flag alert on 12/14/15. Report date: 12/13/15. Telephonic RNCM will follow patient for next 30 days for [facility] Stroke program if needed. Telephonic RNCM EMMI Stroke program  end date: 01/14/16. RNCM will assess EMMI Stroke program end date on next patient outreach and need for hypertension disease management.  Patient awaiting further direction from MD's office regarding blood pressure treatment regimen due to continued elevated blood pressures and low pulse rate,   Plan:  RNCM will call patient within 1 week for completion of EMMI Stroke follow up and hypertension program initiation.  RNCM will follow up with patient within 1 week to obtain update regarding patient status and blood pressure readings over the previous week.  RNCM will follow up with patient regarding clarification of blood pressure parameters and when to notify MD's office within 1 week.   Waynetta Metheny H. Annia Friendly, BSN, [place] [facility] Telephonic CM Phone: [phone] Fax: [phone]

## 2016-01-01 NOTE — Telephone Encounter (Signed)
Patient concern about pulse rate: 53, 50, 48, today 51. BP today 174/77, on 12/31/15 170/76. She feels she is tolerating the medication ok, no headaches, not dizzy. Will route message to [name]. Next appt 01/22/16.

## 2016-01-01 NOTE — Telephone Encounter (Signed)
Spoke with patient, gave her Dr. Cyndi Lennert message. If she starts not feeling well, call back, she will.

## 2016-01-01 NOTE — Telephone Encounter (Signed)
Left message for patient to call back  

## 2016-01-01 NOTE — Telephone Encounter (Signed)
She is asymptomatic with this pulse so I am not concerned about that.  I would like for her blood pressure to be better.  Will address at her appt.

## 2016-01-02 DIAGNOSIS — I739 Peripheral vascular disease, unspecified: Secondary | ICD-10-CM | POA: Diagnosis not present

## 2016-01-02 DIAGNOSIS — J45909 Unspecified asthma, uncomplicated: Secondary | ICD-10-CM | POA: Diagnosis not present

## 2016-01-02 DIAGNOSIS — M6281 Muscle weakness (generalized): Secondary | ICD-10-CM | POA: Diagnosis not present

## 2016-01-02 DIAGNOSIS — I10 Essential (primary) hypertension: Secondary | ICD-10-CM | POA: Diagnosis not present

## 2016-01-02 DIAGNOSIS — I69398 Other sequelae of cerebral infarction: Secondary | ICD-10-CM | POA: Diagnosis not present

## 2016-01-02 DIAGNOSIS — F419 Anxiety disorder, unspecified: Secondary | ICD-10-CM | POA: Diagnosis not present

## 2016-01-04 DIAGNOSIS — I69398 Other sequelae of cerebral infarction: Secondary | ICD-10-CM | POA: Diagnosis not present

## 2016-01-04 DIAGNOSIS — I10 Essential (primary) hypertension: Secondary | ICD-10-CM | POA: Diagnosis not present

## 2016-01-04 DIAGNOSIS — F419 Anxiety disorder, unspecified: Secondary | ICD-10-CM | POA: Diagnosis not present

## 2016-01-04 DIAGNOSIS — M6281 Muscle weakness (generalized): Secondary | ICD-10-CM | POA: Diagnosis not present

## 2016-01-04 DIAGNOSIS — J45909 Unspecified asthma, uncomplicated: Secondary | ICD-10-CM | POA: Diagnosis not present

## 2016-01-04 DIAGNOSIS — I739 Peripheral vascular disease, unspecified: Secondary | ICD-10-CM | POA: Diagnosis not present

## 2016-01-08 ENCOUNTER — Encounter: Payer: Self-pay | Admitting: *Deleted

## 2016-01-08 ENCOUNTER — Other Ambulatory Visit: Payer: Self-pay | Admitting: *Deleted

## 2016-01-08 VITALS — BP 170/71

## 2016-01-08 DIAGNOSIS — I10 Essential (primary) hypertension: Secondary | ICD-10-CM

## 2016-01-08 NOTE — Patient Outreach (Signed)
[facility]) Care Management  [place]  01/08/2016   [name] [name] Sep 25, 1924 [id number]  Subjective:  Telephone to patient's home number, spoke with patient, and HIPAA verified.   Patient states she is doing well, has no dizziness or headaches,  and received call back from MD's office.  States she reported blood pressure and pulse readings.   States the lowest pulse reading she has reported to MD was 47.  RNCM advised patient to report pulse below 47 to MD.   Patient verbalized understanding of signs and symptoms to report to MD.   Patient states her  MD was not concerned with pulse rate above 47 and will address blood pressure goal on next MD appointment on 01/22/16.   Patient states her blood pressure prior to medication today was 170/71, pulse 50, and she has not taking the post medication reading.   States her blood pressure on 01/07/16 was 166/65, pulse 54, and she forgot to record the post medication reading.   States her blood pressure on 01/06/16 was 167/67, pulse 53, and post medication reading was 148/65.    Patient states she will review her Advanced Directives with daughter on daughter's next visit, let RNCM know if she would like additional information, or need new forms to make changes.   Patient in agreement to [place] referral for [employer] community resources.  Patient in agreement to continue to receive [place] Management services.  Objective: Per Epic case review: Patient hospitalized 12/09/15 - 12/11/15 for CVA. Patient's last appointment with cardiologist provider Truitt Merle NP was on 12/13/15. Per MD's office notes, patient prescribed Bystolic 5mg  po daily on 12/27/15 and decreased Amlodipine 5 mg po daily instead of 10mg  po daily. Patient to call MD if blood pressure not under 150/90 within 3 days.  Per primary MD, still wants patient's blood pressure to be better and will discuss with patient at next appointment.   Encounter  Medications:  Outpatient Encounter Prescriptions as of 01/08/2016  Medication Sig Note  . acetaminophen (TYLENOL) 500 MG tablet Take 1 tablet (500 mg total) by mouth every 6 (six) hours as needed for moderate pain.   Marland Kitchen albuterol (PROVENTIL HFA;VENTOLIN HFA) 108 (90 BASE) MCG/ACT inhaler Inhale 2 puffs into the lungs every 6 (six) hours as needed for wheezing.   Marland Kitchen alum hydroxide-mag trisilicate (GAVISCON) [id number] MG CHEW chewable tablet Chew 1 tablet by mouth 3 (three) times daily as needed for flatulence (bowel spasms).   Marland Kitchen amLODipine (NORVASC) 5 MG tablet Take one tablet by mouth once daily to control blood pressure   . atorvastatin (LIPITOR) 10 MG tablet Take 1 tablet (10 mg total) by mouth daily at 6 PM.   . bimatoprost (LUMIGAN) 0.01 % SOLN Place 1 drop into both eyes at bedtime.   . Calcium Carbonate-Vitamin D (CALTRATE 600+D) 600-400 MG-UNIT per tablet Take 2 tablets by mouth daily.   . clopidogrel (PLAVIX) 75 MG tablet Take 1 tablet (75 mg total) by mouth daily.   . Coenzyme Q10 (COQ10) 50 MG CAPS Take one tablet by mouth once daily   . fluticasone (FLONASE) 50 MCG/ACT nasal spray Place 1 spray into the nose daily.   . hydrocortisone (ANUSOL-HC) 2.5 % rectal cream Place 1 application rectally 2 (two) times daily. 01/08/2016: Patient reports taken as needed.   . Lansoprazole (PREVACID PO) Take by mouth daily.   Marland Kitchen loteprednol (LOTEMAX) 0.5 % ophthalmic suspension Place 1 drop into both eyes daily.    Marland Kitchen  Multiple Vitamins-Minerals (SENIOR MULTIVITAMIN PLUS) TABS Take by mouth.   . nebivolol (BYSTOLIC) 5 MG tablet Take 1 tablet (5 mg total) by mouth daily. To control blood pressure   . nitroGLYCERIN (NITROSTAT) 0.4 MG SL tablet Place 1 tablet (0.4 mg total) under the tongue every 5 (five) minutes as needed for chest pain. 01/08/2016: Patient reports only takes as needed and has not needed in a long time.    . Omega-3 Fatty Acids (FISH OIL) 1200 MG CPDR Take one tablet by mouth once daily   . RESTASIS  0.05 % ophthalmic emulsion Place 1 drop into both eyes 2 (two) times daily.  06/15/2014: Received from: External Pharmacy  . risperiDONE (RISPERDAL) 0.5 MG tablet TAKE 1 TABLET BY MOUTH TWICE A DAY   . senna-docusate (SENOKOT-S) 8.6-50 MG tablet Take 1 tablet by mouth daily. 01/08/2016: Patient reports takes as needed.   . SYMBICORT 80-4.5 MCG/ACT inhaler Place 2 puffs into alternate nostrils 2 (two) times daily. 10/19/2014: Received from: External Pharmacy Received Sig:   . vitamin B-12 (CYANOCOBALAMIN) 1000 MCG tablet Take 1,000 mcg by mouth daily.   . vitamin C (ASCORBIC ACID) 500 MG tablet Take 500 mg by mouth daily.   . brinzolamide (AZOPT) 1 % ophthalmic suspension Place 1 drop into both eyes 3 (three) times daily. Reported on 01/08/2016   . Multiple Vitamins-Minerals (OCUVITE PRESERVISION PO) Take by mouth. Reported on 01/08/2016    No facility-administered encounter medications on file as of 01/08/2016.    Functional Status:  In your present state of health, do you have any difficulty performing the following activities: 01/08/2016 12/10/2015  Hearing? N N  Vision? Y N  Difficulty concentrating or making decisions? N N  Walking or climbing stairs? N N  Dressing or bathing? N N  Doing errands, shopping? Y N  Preparing Food and eating ? N -  Using the Toilet? N -  In the past six months, have you accidently leaked urine? N -  Do you have problems with loss of bowel control? N -  Managing your Medications? N -  Managing your Finances? N -  Housekeeping or managing your Housekeeping? N -    Fall/Depression Screening: PHQ 2/9 Scores 01/08/2016 12/14/2015 12/14/2015 10/19/2014 06/30/2014 06/11/2013  PHQ - 2 Score 0 0 0 0 0 0   Fall Risk  01/08/2016 12/14/2015 12/14/2015 10/27/2015 07/17/2015  Falls in the past year? No No No No No   THN CM Care Plan Problem One        Most Recent Value   Care Plan Problem One  Patient has elevated blood pressure.    Role Documenting the Problem One  Care Management  Telephonic Coordinator   Care Plan for Problem One  Active   THN Long Term Goal (31-90 days)  Patient will not have inpatient admission for hypertension within 31 days of case management start date.    THN Long Term Goal Start Date  01/08/16   Interventions for Problem One Long Term Goal  RNCM educated patient on signs and symptoms to report to MD.    [facility] CM Short Term Goal #1 (0-30 days)  Patient will report blood pressure above goal of 150/90 within 24 hours or immediately if symptomatic to primary MD.    [facility] CM Short Term Goal #1 Start Date  01/08/16   Interventions for Short Term Goal #1  RNCM educated patient to monitor, record, daily blood pressure readings, before and after blood pressure medication administration.  THN CM Short Term Goal #2 (0-30 days)  Patient will report heart rate below 47 within 24 hours or immediately if symptomatic to primary MD.   [facility] CM Short Term Goal #2 Start Date  01/08/16   Interventions for Short Term Goal #2  RNCM educated patient to monitor, record, daily heart rate readings, before and after blood pressure medication administration.      Assessment: Received EMMI Stroke red flag alert on 12/14/15. Report date: 12/13/15. Telephonic RNCM will follow patient for next 30 days for [facility] Stroke program if needed. Telephonic RNCM EMMI Stroke program end date: 01/14/16. Patient will continue to receive [place] Management services for hypertension disease education, management, and monitoring.   Plan:  RNCM will refer patient to [place] Worker for [employer] community resources.  RNCM will send primary MD Barrier letter and initial assessment. RNCM will send patient [place].   RNCM will call patient within 3 weeks to follow up on blood pressure readings and outcome of 01/22/16 primary MD office visit.  RNCM will follow up with patient within 3 weeks for completion of hypertension  assessment.  RNCM will send patient EMMI Handout: About  High Blood Pressure (Hypertension), educational resource letter, and verify receipt within 3 weeks.   Lugene Beougher H. Annia Friendly, BSN, [place] Management [facility] Telephonic CM Phone: [phone] Fax: [phone]

## 2016-01-09 DIAGNOSIS — F419 Anxiety disorder, unspecified: Secondary | ICD-10-CM | POA: Diagnosis not present

## 2016-01-09 DIAGNOSIS — J45909 Unspecified asthma, uncomplicated: Secondary | ICD-10-CM | POA: Diagnosis not present

## 2016-01-09 DIAGNOSIS — I10 Essential (primary) hypertension: Secondary | ICD-10-CM | POA: Diagnosis not present

## 2016-01-09 DIAGNOSIS — I739 Peripheral vascular disease, unspecified: Secondary | ICD-10-CM | POA: Diagnosis not present

## 2016-01-09 DIAGNOSIS — M6281 Muscle weakness (generalized): Secondary | ICD-10-CM | POA: Diagnosis not present

## 2016-01-09 DIAGNOSIS — I69398 Other sequelae of cerebral infarction: Secondary | ICD-10-CM | POA: Diagnosis not present

## 2016-01-12 DIAGNOSIS — I69398 Other sequelae of cerebral infarction: Secondary | ICD-10-CM | POA: Diagnosis not present

## 2016-01-12 DIAGNOSIS — I10 Essential (primary) hypertension: Secondary | ICD-10-CM | POA: Diagnosis not present

## 2016-01-12 DIAGNOSIS — F419 Anxiety disorder, unspecified: Secondary | ICD-10-CM | POA: Diagnosis not present

## 2016-01-12 DIAGNOSIS — M6281 Muscle weakness (generalized): Secondary | ICD-10-CM | POA: Diagnosis not present

## 2016-01-12 DIAGNOSIS — I739 Peripheral vascular disease, unspecified: Secondary | ICD-10-CM | POA: Diagnosis not present

## 2016-01-12 DIAGNOSIS — J45909 Unspecified asthma, uncomplicated: Secondary | ICD-10-CM | POA: Diagnosis not present

## 2016-01-15 ENCOUNTER — Other Ambulatory Visit: Payer: Self-pay | Admitting: Licensed Clinical Social Worker

## 2016-01-15 NOTE — Patient Outreach (Signed)
[facility]) Care Management  01/15/2016  DANICA PEDREIRA 06/11/1924 [id number]   Assessment-This CSW received request from [place] to mail patient cleaning resources for [facility].  Plan-CSW will send request to [place] Management Assistant to mail out cleaning resources.  Eula Fried, BSW, MSW, [email or web address] Phone: [phone] Fax: [phone]

## 2016-01-16 ENCOUNTER — Ambulatory Visit (INDEPENDENT_AMBULATORY_CARE_PROVIDER_SITE_OTHER): Payer: Medicare Other | Admitting: Podiatry

## 2016-01-16 ENCOUNTER — Encounter: Payer: Self-pay | Admitting: Podiatry

## 2016-01-16 DIAGNOSIS — M79676 Pain in unspecified toe(s): Secondary | ICD-10-CM | POA: Diagnosis not present

## 2016-01-16 DIAGNOSIS — B351 Tinea unguium: Secondary | ICD-10-CM

## 2016-01-16 DIAGNOSIS — Q828 Other specified congenital malformations of skin: Secondary | ICD-10-CM

## 2016-01-16 NOTE — Progress Notes (Signed)
[name], female   DOB: [birth year], 82 y.o.   MRN: [id number] Complaint:  Visit Type: Patient returns to my office for continued preventative foot care services. Complaint: Patient states" my nails have grown long and thick and become painful to walk and wear shoes" Patient has been diagnosed with neuropathy both feet.. The patient presents for preventative foot care services. No changes to ROS.  She has painful callus both feet which is painful walking in her shoes  Podiatric Exam: Vascular: dorsalis pedis and posterior tibial pulses are palpable bilateral. Capillary return is immediate. Temperature gradient is WNL. Skin turgor WNL  Sensorium: Normal Semmes Weinstein monofilament test. Normal tactile sensation bilaterally. Nail Exam: Pt has thick disfigured discolored nails with subungual debris noted bilateral entire nail hallux through fifth toenails Ulcer Exam: There is no evidence of ulcer or pre-ulcerative changes or infection. Orthopedic Exam: Muscle tone and strength are WNL. No limitations in general ROM. No crepitus or effusions noted. Foot type and digits show no abnormalities. Bony prominences are unremarkable. Skin: porokeratosis sub 1,3 right foot and fourth toe left.    Onychomycosis, , Pain in right toe, pain in left toes, porokeratosis B/L  Treatment & Plan Procedures and Treatment: Consent by patient was obtained for treatment procedures. The patient understood the discussion of treatment and procedures well. All questions were answered thoroughly reviewed. Debridement of mycotic and hypertrophic toenails, 1 through 5 bilateral and clearing of subungual debris. No ulceration, no infection noted. Debridement of porokeratosis B/L Return Visit-Office Procedure: Patient instructed to return to the office for a follow up visit 3 months for continued evaluation and treatment.    Gardiner Barefoot DPM

## 2016-01-17 NOTE — Patient Outreach (Signed)
[facility]) Care Management  01/17/2016  [name] [name] 1924-05-30 [id number]   Request received from Eula Fried, LCSW to mail patient cleaning resources. Information mailed today, 01/17/16.

## 2016-01-22 ENCOUNTER — Encounter: Payer: Self-pay | Admitting: Internal Medicine

## 2016-01-22 ENCOUNTER — Ambulatory Visit (INDEPENDENT_AMBULATORY_CARE_PROVIDER_SITE_OTHER): Payer: Medicare Other | Admitting: Internal Medicine

## 2016-01-22 VITALS — BP 120/70 | HR 58 | Ht 62.0 in | Wt 138.0 lb

## 2016-01-22 DIAGNOSIS — R6881 Early satiety: Secondary | ICD-10-CM | POA: Diagnosis not present

## 2016-01-22 DIAGNOSIS — I872 Venous insufficiency (chronic) (peripheral): Secondary | ICD-10-CM

## 2016-01-22 DIAGNOSIS — I639 Cerebral infarction, unspecified: Secondary | ICD-10-CM | POA: Diagnosis not present

## 2016-01-22 DIAGNOSIS — K224 Dyskinesia of esophagus: Secondary | ICD-10-CM

## 2016-01-22 DIAGNOSIS — I10 Essential (primary) hypertension: Secondary | ICD-10-CM

## 2016-01-22 DIAGNOSIS — Z23 Encounter for immunization: Secondary | ICD-10-CM

## 2016-01-22 DIAGNOSIS — J4520 Mild intermittent asthma, uncomplicated: Secondary | ICD-10-CM

## 2016-01-22 MED ORDER — TETANUS-DIPHTH-ACELL PERTUSSIS 5-2.5-18.5 LF-MCG/0.5 IM SUSP: 0.5000 mL | Freq: Once | INTRAMUSCULAR | Status: DC

## 2016-01-22 NOTE — Progress Notes (Signed)
[name], female   DOB: Nov 15, 1923, 82 y.o.   MRN: [id number]   Location:  [facility] clinic Provider:  Akshath Mccarey [name], D.O., C.M.D.  Code Status: full code Goals of Care:  Advanced Directives 01/22/2016  Does patient have an advance directive? Yes  Type of Advance Directive -  Does patient want to make changes to advanced directive? -  Copy of advanced directive(s) in chart? No - copy requested   Chief Complaint  Patient presents with  . Medical Management of Chronic Issues    23mth follow-up    HPI: Patient is a 82 y.o. female seen today for medical management of chronic diseases and weight loss.  She has gained 6 lbs since last time.  This is great b/c she'd been steadily losing which was quite concerning.  Feeling well.  Started drinking her ensure again and that's what's helped.    Went to podiatrist and can wear shoes she has not worn in a while b/c her feet feel so good.  BP was high at home this am before her medication, but better now here at 120/70.  Is checking her bp before and after her medication (about 1 hr).  Has an arm cuff she's had about 3 years.  BPs at home range in 170s-180s when she gets up and 140s-150s after her medication on average, but here today it's 120.      Breathing well.  Not short of breath.  Later says she will be sob when sitting at times.  Reports increased stress.  Thinks it might pass after a period of time.    Continues to feel bloated/stuffed after a small amount of food.  Has some trouble bending over to put on her stockings.  Refused to get esophagus stretched when she heard the risks.    Chronic venous insufficiency:  Very little swelling in her lateral ankle.  Using compression hose.    Past Medical History  Diagnosis Date  . Benign essential hypertension   . Osteoarthritis   . GERD (gastroesophageal reflux disease)   . Seasonal allergies   . Asthma   . Glaucoma   . PVD (peripheral vascular disease) ([place])   . Anxiety      Past Surgical History  Procedure Laterality Date  . Abdominal hysterectomy  1981  . Tonsillectomy  1953    Allergies  Allergen Reactions  . Aspirin     sweats  . Codeine   . Iodine     Pt is not aware of this allergy, does not recall much info....//a.c.  . Prednisone     sweats  . Sulfa Antibiotics     Doesn't remember      Medication List       This list is accurate as of: 01/22/16 11:16 AM.  Always use your most recent med list.               acetaminophen 500 MG tablet  Commonly known as:  TYLENOL  Take 1 tablet (500 mg total) by mouth every 6 (six) hours as needed for moderate pain.     albuterol 108 (90 Base) MCG/ACT inhaler  Commonly known as:  PROVENTIL HFA;VENTOLIN HFA  Inhale 2 puffs into the lungs every 6 (six) hours as needed for wheezing.     alum hydroxide-mag trisilicate [id number] MG Chew chewable tablet  Commonly known as:  GAVISCON  Chew 1 tablet by mouth 3 (three) times daily as needed for flatulence (bowel spasms).  amLODipine 5 MG tablet  Commonly known as:  NORVASC  Take one tablet by mouth once daily to control blood pressure     atorvastatin 10 MG tablet  Commonly known as:  LIPITOR  Take 1 tablet (10 mg total) by mouth daily at 6 PM.     bimatoprost 0.01 % Soln  Commonly known as:  LUMIGAN  Place 1 drop into both eyes at bedtime.     brinzolamide 1 % ophthalmic suspension  Commonly known as:  AZOPT  Place 1 drop into both eyes 3 (three) times daily. Reported on 01/08/2016     CALTRATE 600+D 600-400 MG-UNIT tablet  Generic drug:  Calcium Carbonate-Vitamin D  Take 2 tablets by mouth daily.     clopidogrel 75 MG tablet  Commonly known as:  PLAVIX  Take 1 tablet (75 mg total) by mouth daily.     CoQ10 50 MG Caps  Take one tablet by mouth once daily     Fish Oil 1200 MG Cpdr  Take one tablet by mouth once daily     fluticasone 50 MCG/ACT nasal spray  Commonly known as:  FLONASE  Place 1 spray into the nose daily.      hydrocortisone 2.5 % rectal cream  Commonly known as:  ANUSOL-HC  Place 1 application rectally 2 (two) times daily.     loteprednol 0.5 % ophthalmic suspension  Commonly known as:  LOTEMAX  Place 1 drop into both eyes daily.     nebivolol 5 MG tablet  Commonly known as:  BYSTOLIC  Take 1 tablet (5 mg total) by mouth daily. To control blood pressure     PREVACID PO  Take by mouth daily.     RESTASIS 0.05 % ophthalmic emulsion  Generic drug:  cycloSPORINE  Place 1 drop into both eyes 2 (two) times daily.     risperiDONE 0.5 MG tablet  Commonly known as:  RISPERDAL  TAKE 1 TABLET BY MOUTH TWICE A DAY     OCUVITE PRESERVISION PO  Take by mouth. Reported on 01/08/2016     SENIOR MULTIVITAMIN PLUS Tabs  Take by mouth.     senna-docusate 8.6-50 MG tablet  Commonly known as:  Senokot-S  Take 1 tablet by mouth daily.     SYMBICORT 80-4.5 MCG/ACT inhaler  Generic drug:  budesonide-formoterol  Place 2 puffs into alternate nostrils 2 (two) times daily.     vitamin B-12 1000 MCG tablet  Commonly known as:  CYANOCOBALAMIN  Take 1,000 mcg by mouth daily.     vitamin C 500 MG tablet  Commonly known as:  ASCORBIC ACID  Take 500 mg by mouth daily.        Review of Systems:  Review of Systems  Constitutional: Negative for fever, chills, weight loss and malaise/fatigue.  HENT: Negative for congestion.   Respiratory: Positive for shortness of breath.        When sitting  Cardiovascular: Positive for leg swelling. Negative for chest pain and palpitations.       Edema doing well with compression hose  Gastrointestinal: Positive for heartburn. Negative for nausea, vomiting, abdominal pain, diarrhea, constipation, blood in stool and melena.       Early satiety, known esophageal stenosis in need of dilation but she's refused  Genitourinary: Negative for dysuria.  Musculoskeletal: Negative for falls.  Skin: Negative for rash.  Neurological: Negative for dizziness and weakness.    Endo/Heme/Allergies: Does not bruise/bleed easily.  Psychiatric/Behavioral: Positive for hallucinations and memory loss. The  patient is nervous/anxious.     Health Maintenance  Topic Date Due  . MAMMOGRAM  10/07/2018 (Originally 09/28/2009)  . ZOSTAVAX  10/07/2018 (Originally 10/04/1986)  . TETANUS/TDAP  10/07/2018 (Originally 10/04/1945)  . INFLUENZA VACCINE  05/07/2016  . DEXA SCAN  Completed  . PNA vac Low Risk Adult  Completed    Physical Exam: Filed Vitals:   01/22/16 1108  BP: 120/70  Pulse: 58  Height: 5\' 2"  (1.575 m)  Weight: 138 lb (62.596 kg)  SpO2: 98%   Body mass index is 25.23 kg/(m^2). Physical Exam  Constitutional: She is oriented to person, place, and time. She appears well-developed and well-nourished. No distress.  Cardiovascular: Normal rate, regular rhythm, normal heart sounds and intact distal pulses.   Edema improved with compression hose  Pulmonary/Chest: Effort normal and breath sounds normal. No respiratory distress. She has no rales.  Abdominal: Soft. Bowel sounds are normal. She exhibits no distension and no mass. There is no tenderness.  Musculoskeletal: Normal range of motion.  Neurological: She is alert and oriented to person, place, and time.  Skin: Skin is warm and dry.  Psychiatric:  Speaks slowly, calmly, then expresses anxiety and stress     Labs reviewed: Basic Metabolic Panel:  Recent Labs  03/20/15 1517 12/09/15 1414 12/09/15 1421  NA 143 142 142  K 4.2 3.9 3.9  CL 105 108 105  CO2 23 25  --   GLUCOSE 103* 122* 122*  BUN 20 20 22*  CREATININE 0.82 0.80 0.90  CALCIUM 9.3 9.3  --    Liver Function Tests:  Recent Labs  03/20/15 1517 12/09/15 1414  AST 17 22  ALT 14 18  ALKPHOS 64 63  BILITOT 0.7 0.9  PROT 6.4 6.6  ALBUMIN 4.2 3.7   No results for input(s): LIPASE, AMYLASE in the last 8760 hours. No results for input(s): AMMONIA in the last 8760 hours. CBC:  Recent Labs  03/20/15 1517 12/09/15 1414  12/09/15 1421  WBC 5.2 5.3  --   NEUTROABS 3.1 3.1  --   HGB  --  11.5* 12.6  HCT 32.8* 33.7* 37.0  MCV 91 90.1  --   PLT 235 211  --    Lipid Panel:  Recent Labs  12/10/15 0549  CHOL 173  HDL 86  LDLCALC 80  TRIG 34  CHOLHDL 2.0   Lab Results  Component Value Date   HGBA1C 5.7* 12/10/2015     Assessment/Plan 1. Early satiety -does have known esophageal stenosis noted on barium swallow, but refuses dilatation due to risks of procedure -suspect this is the reason for her premature full sensation -weight has gone up with resuming ensure  2. Benign essential hypertension -bp at goal for her age, doing fine with bp </=150/90  3. Esophageal dysmotility -with stenosis, no aspiration episodes, but does have early satiety symptoms  4. Asthma, mild intermittent, uncomplicated -stable with albuterol as needed which she has not had to use -does not seem that her "sob" is related to this, but anxiety  5. Chronic venous insufficiency -cont compression hose and elevating feet at rest, low sodium diet  6. Need for tetanus booster - Tdap (BOOSTRIX) 5-2.5-18.5 LF-MCG/0.5 injection; Inject 0.5 mLs into the muscle once.  Dispense: 0.5 mL; Refill: 0 Rx given and explained to get at pharmacy with [facility]--she agreed when explained that it is also a whooping cough booster  Labs/tests ordered:  No orders of the defined types were placed in this encounter.  Next appt:  04/25/2016 med mgt, come fasting for labs   Jef Futch L. Mahmoud Blazejewski, D.O. [facility] [address], [place] [address] Cell Phone (Mon-Fri 8am-5pm):  [phone] On Call:  [phone] & follow prompts after 5pm & weekends Office Phone:  [phone] Office Fax:  [phone]

## 2016-01-22 NOTE — Patient Instructions (Addendum)
Take bystolic (nebivolol) before bed.  Continue norvasc (amlodipine) in the morning.  Continue to check your blood pressure in the morning before and then one hour after you've taken the amlodipine.    Continue to take your ensure supplement.

## 2016-01-25 ENCOUNTER — Telehealth: Payer: Self-pay | Admitting: *Deleted

## 2016-01-25 NOTE — Telephone Encounter (Signed)
Spoke with patient and advised results, I went over the dose twice and she understood, 1 amlodipine and 2 of the 5mg  bystolic

## 2016-01-25 NOTE — Telephone Encounter (Signed)
Ok, let's have her go back to taking both amlodipine and bystolic in the morning.   Increase the bystolic dose to 10mg  from 5mg  daily. Cont to check bps and if still over 150/90 next week, call back

## 2016-01-25 NOTE — Telephone Encounter (Signed)
Patient called and stated that her BP was running high and her medication is not working. Yesterday 198/72. Today out of bed 199/75, she took her Amlodipine and it was 180/71. Please Advise.

## 2016-01-26 ENCOUNTER — Other Ambulatory Visit: Payer: Self-pay | Admitting: *Deleted

## 2016-01-26 NOTE — Patient Outreach (Addendum)
[facility]) Care Management  [place]  01/26/2016   CAMBRIA OSTEN 29-Jun-1924 [id number]  Subjective: Telephone call to patient's home number, spoke with patient, and HIPAA verified.   Patient states she is doing ok and currently lying down resting.   States her MD appointment went well on 01/22/16 and the MD increased her bystolic blood pressure medicine.  States her blood pressure was around 160 /60 today but did not have recorded readings available.    Patient in agreement to continued [place] Management services and requested call back next week.   Objective: Per Epic case review: Patient hospitalized 12/09/15 - 12/11/15 for CVA. Patient's last appointment with cardiologist provider Truitt Merle NP was on 12/13/15. Per MD's office notes, patient prescribed Bystolic 36m po daily on 37/42/59and decreased Amlodipine 5 mg po daily instead of 186mpo daily. Patient to call MD if blood pressure not under 150/90 within 3 days. Per primary MD, still wants patient's blood pressure to be better and will discuss with patient at next appointment.   Patient blood pressure medication increased this week due to elevated blood pressures.   Encounter Medications:  Outpatient Encounter Prescriptions as of 01/26/2016  Medication Sig Note  . acetaminophen (TYLENOL) 500 MG tablet Take 1 tablet (500 mg total) by mouth every 6 (six) hours as needed for moderate pain.   . Marland Kitchenlbuterol (PROVENTIL HFA;VENTOLIN HFA) 108 (90 BASE) MCG/ACT inhaler Inhale 2 puffs into the lungs every 6 (six) hours as needed for wheezing.   . Marland Kitchenlum hydroxide-mag trisilicate (GAVISCON) 8056-38G CHEW chewable tablet Chew 1 tablet by mouth 3 (three) times daily as needed for flatulence (bowel spasms).   . Marland KitchenmLODipine (NORVASC) 5 MG tablet Take one tablet by mouth once daily to control blood pressure   . atorvastatin (LIPITOR) 10 MG tablet Take 1 tablet (10 mg total) by mouth daily at 6 PM.   . bimatoprost (LUMIGAN)  0.01 % SOLN Place 1 drop into both eyes at bedtime.   . brinzolamide (AZOPT) 1 % ophthalmic suspension Place 1 drop into both eyes 3 (three) times daily. Reported on 01/08/2016   . Calcium Carbonate-Vitamin D (CALTRATE 600+D) 600-400 MG-UNIT per tablet Take 2 tablets by mouth daily.   . clopidogrel (PLAVIX) 75 MG tablet Take 1 tablet (75 mg total) by mouth daily.   . Coenzyme Q10 (COQ10) 50 MG CAPS Take one tablet by mouth once daily   . fluticasone (FLONASE) 50 MCG/ACT nasal spray Place 1 spray into the nose daily.   . hydrocortisone (ANUSOL-HC) 2.5 % rectal cream Place 1 application rectally 2 (two) times daily.   . Lansoprazole (PREVACID PO) Take by mouth daily.   . Marland Kitchenoteprednol (LOTEMAX) 0.5 % ophthalmic suspension Place 1 drop into both eyes daily.    . Multiple Vitamins-Minerals (OCUVITE PRESERVISION PO) Take by mouth. Reported on 01/08/2016   . Multiple Vitamins-Minerals (SENIOR MULTIVITAMIN PLUS) TABS Take by mouth.   . nebivolol (BYSTOLIC) 5 MG tablet Take 1 tablet (5 mg total) by mouth daily. To control blood pressure   . Omega-3 Fatty Acids (FISH OIL) 1200 MG CPDR Take one tablet by mouth once daily   . RESTASIS 0.05 % ophthalmic emulsion Place 1 drop into both eyes 2 (two) times daily.  06/15/2014: Received from: External Pharmacy  . risperiDONE (RISPERDAL) 0.5 MG tablet TAKE 1 TABLET BY MOUTH TWICE A DAY   . senna-docusate (SENOKOT-S) 8.6-50 MG tablet Take 1 tablet by mouth daily.   . SYMBICORT 80-4.5  MCG/ACT inhaler Place 2 puffs into alternate nostrils 2 (two) times daily. 10/19/2014: Received from: External Pharmacy Received Sig:   . Tdap (BOOSTRIX) 5-2.5-18.5 LF-MCG/0.5 injection Inject 0.5 mLs into the muscle once.   . vitamin B-12 (CYANOCOBALAMIN) 1000 MCG tablet Take 1,000 mcg by mouth daily.   . vitamin C (ASCORBIC ACID) 500 MG tablet Take 500 mg by mouth daily.    No facility-administered encounter medications on file as of 01/26/2016.    Functional Status:  In your present  state of health, do you have any difficulty performing the following activities: 01/08/2016 12/10/2015  Hearing? N N  Vision? Y N  Difficulty concentrating or making decisions? N N  Walking or climbing stairs? N N  Dressing or bathing? N N  Doing errands, shopping? Y N  Preparing Food and eating ? N -  Using the Toilet? N -  In the past six months, have you accidently leaked urine? N -  Do you have problems with loss of bowel control? N -  Managing your Medications? N -  Managing your Finances? N -  Housekeeping or managing your Housekeeping? N -    Fall/Depression Screening: PHQ 2/9 Scores 01/08/2016 12/14/2015 12/14/2015 10/19/2014 06/30/2014 06/11/2013  PHQ - 2 Score 0 0 0 0 0 0   Fall Risk  01/08/2016 12/14/2015 12/14/2015 10/27/2015 07/17/2015  Falls in the past year? No No No No No   [facility] CM Care Plan Problem One        Most Recent Value   Care Plan Problem One  Patient has elevated blood pressure.    Role Documenting the Problem One  Care Management Telephonic Coordinator   Care Plan for Problem One  Active   THN Long Term Goal (31-90 days)  Patient will not have inpatient admission for hypertension within 31 days of case management start date.    [facility]N Long Term Goal Start Date  01/08/16   [facility]N Long Term Goal Met Date  -- [facility] has not had any admissions since 12/09/15.]   Interventions for Problem One Long Term Goal  RNCM educated patient on signs and symptoms to report to MD.    [facility] CM Short Term Goal #1 (0-30 days)  Patient will report blood pressure above goal of 150/90 within 24 hours or immediately if symptomatic to primary MD.    [facility] CM Short Term Goal #1 Start Date  01/08/16   [facility] CM Short Term Goal #1 Met Date  01/26/16 [Patient is reporting elevated blood pressures to MD.]   Interventions for Short Term Goal #1  RNCM educated patient to monitor, record, daily blood pressure readings, before and after blood pressure medication administration.    [facility] CM Short Term Goal #2 (0-30 days)   Patient will report heart rate below 47 within 24 hours or immediately if symptomatic to primary MD.   [facility] CM Short Term Goal #2 Start Date  01/08/16   [facility] CM Short Term Goal #2 Met Date  -- [Patient did not report heart rate to [facility] today.]   Interventions for Short Term Goal #2  RNCM educated patient to monitor, record, daily heart rate readings, before and after blood pressure medication administration.      Assessment: Received EMMI Stroke red flag alert on 12/14/15. Report date: 12/13/15. Telephonic RNCM will follow patient for next 30 days for [facility] Stroke program if needed. Telephonic RNCM EMMI Stroke program end date: 01/14/16. Patient will continue to receive [facility] Care Management services for hypertension disease education,  management, and monitoring.   Plan: RNCM will call patient within 1 week to follow up on blood pressure readings and outcome of 01/22/16 primary MD office visit.  RNCM will verify status of  light housekeeping community resources within 1 week.  RNCM will verify patient's receipt of [place] within 1 week. RNCM will follow up with patient within 1 week for completion of hypertension assessment.  RNCM will verify patient's receipt of EMMI Handout: About High Blood Pressure (Hypertension) and educational resource letter within 1 week.   Taquita Demby H. Annia Friendly, BSN, [place] Management [facility] Telephonic CM Phone: [phone] Fax: [phone]

## 2016-01-29 ENCOUNTER — Other Ambulatory Visit: Payer: Self-pay | Admitting: *Deleted

## 2016-01-29 NOTE — Patient Outreach (Addendum)
[facility] ([facility]) Care Management  [facility] Care Manager  01/29/2016   Dahiana M Roughton 04/21/1924 [id number]  Subjective: Telephone call to patient's home number, spoke with patient, and HIPAA verified.    Patient states she is doing well, no headaches or dizziness.   States her MD appointment with on 01/22/16 went well and MD continuing to monitor blood pressure readings.  States she is very appreciative of the [facility] Patient Calendar book, [facility] Welcome Packet,  and all of the educational materials (EMMI Handout: About High Blood Pressure (Hypertension)) that she has received.   States she has received the [facility] Care Management Consent and would like for RNCM to review how to fill it out, with her on the next outreach call.   Patient states her blood pressure is still elevated, she reported pressures to MD on 01/25/16 and has increased to bystolic to 10 mg per day per MD recommendation.   States she is taking 5mg of amlodipine and 10 mg of bystolic everyday in the morning.   States she did a 2 day trial  (01/24/16 - 01/25/16) of taking 1 amlodipine (5mg)  in the morning and 1 bystolic (5mg) in the evening, blood pressures seemed to be higher.  States she started the increase dose of bystolic on 01/26/16.  RNCM reviewed  how to use [facility] Patient Calender blood pressure log with patient.   Patient voices understanding and states she will record readings on the log going forward instead on the calendar portion on the book.  Patient states she will record pre medication, post medication blood pressure readings, medications taken and time medications administered.   RNCM advised patient this type of data collection with assist MD with determining blood pressure medication regimen and assist with identifying the cause of blood pressure elevations.  Patient states she does not have questions about the educational materials received at this time.   States she is reviewing materials slowly due to her eye issues and  will let RNCM know if she has any questions. States she has found the information to be very helpful and is very appreciative of RNCM sending her this information.  Patient states the following blood pressure readings:  181/69, heart rate 50 on 01/29/16 pre blood pressure medication and she has not taken the post medication reading yet.   181/74, 50 heart rate, = pre medication and 156/71, 48 heart rate on 01/28/16.  186/70, 50 heart rate = pre medication and 157/70, 48 heart rate post medication on 01/27/16.  180/73, 49 heart rate = pre medication and 160/70, 48 heart rate = post medication on 01/26/16.   188/75, 53 heart rate = pre medication and 149/64, 50 heart rate = post medication on 01/22/16.  RNCM compared to MD's office BP reading on 01/22/16 was 120/70 and per patient was taken approximately 1 hour after post medication reading taken at patient home.  RNCM advised patient's blood pressure monitor is within range with MD office reading on 01/22/16.   RNCM advised patient will continue to monitor patient's blood pressure monitoring accuracy.  Patient states she did receive housekeeping community resource information and will follow up on the resources.  States she is very appreciative of the information.  Patient states she need to get ready for an appointment and is in agreement to telephone outreach next week.   Patient will continue to receive [facility] Care Management services.   Objective: Per Epic case review: Patient hospitalized 12/09/15 - 12/11/15 for CVA. Patient's last appointment   with cardiologist provider Lori Gerhardt NP was on 12/13/15. Per MD's office notes, patient prescribed Bystolic 5mg po daily on 12/27/15 and decreased Amlodipine 5 mg po daily instead of 10mg po daily. Patient to call MD if blood pressure not under 150/90 within 3 days. Per primary MD, still wants patient's blood pressure to be better and will discuss with patient at next appointment. Patient blood pressure medication  increased this week due to elevated blood pressures.   Encounter Medications:  Outpatient Encounter Prescriptions as of 01/29/2016  Medication Sig Note  . acetaminophen (TYLENOL) 500 MG tablet Take 1 tablet (500 mg total) by mouth every 6 (six) hours as needed for moderate pain.   . albuterol (PROVENTIL HFA;VENTOLIN HFA) 108 (90 BASE) MCG/ACT inhaler Inhale 2 puffs into the lungs every 6 (six) hours as needed for wheezing.   . alum hydroxide-mag trisilicate (GAVISCON) 80-20 MG CHEW chewable tablet Chew 1 tablet by mouth 3 (three) times daily as needed for flatulence (bowel spasms).   . amLODipine (NORVASC) 5 MG tablet Take one tablet by mouth once daily to control blood pressure   . atorvastatin (LIPITOR) 10 MG tablet Take 1 tablet (10 mg total) by mouth daily at 6 PM.   . bimatoprost (LUMIGAN) 0.01 % SOLN Place 1 drop into both eyes at bedtime.   . brinzolamide (AZOPT) 1 % ophthalmic suspension Place 1 drop into both eyes 3 (three) times daily. Reported on 01/08/2016   . Calcium Carbonate-Vitamin D (CALTRATE 600+D) 600-400 MG-UNIT per tablet Take 2 tablets by mouth daily.   . clopidogrel (PLAVIX) 75 MG tablet Take 1 tablet (75 mg total) by mouth daily.   . Coenzyme Q10 (COQ10) 50 MG CAPS Take one tablet by mouth once daily   . fluticasone (FLONASE) 50 MCG/ACT nasal spray Place 1 spray into the nose daily.   . hydrocortisone (ANUSOL-HC) 2.5 % rectal cream Place 1 application rectally 2 (two) times daily.   . Lansoprazole (PREVACID PO) Take by mouth daily.   . loteprednol (LOTEMAX) 0.5 % ophthalmic suspension Place 1 drop into both eyes daily.    . Multiple Vitamins-Minerals (OCUVITE PRESERVISION PO) Take by mouth. Reported on 01/08/2016   . Multiple Vitamins-Minerals (SENIOR MULTIVITAMIN PLUS) TABS Take by mouth.   . nebivolol (BYSTOLIC) 5 MG tablet Take 1 tablet (5 mg total) by mouth daily. To control blood pressure   . Omega-3 Fatty Acids (FISH OIL) 1200 MG CPDR Take one tablet by mouth once  daily   . RESTASIS 0.05 % ophthalmic emulsion Place 1 drop into both eyes 2 (two) times daily.  06/15/2014: Received from: External Pharmacy  . risperiDONE (RISPERDAL) 0.5 MG tablet TAKE 1 TABLET BY MOUTH TWICE A DAY   . senna-docusate (SENOKOT-S) 8.6-50 MG tablet Take 1 tablet by mouth daily.   . SYMBICORT 80-4.5 MCG/ACT inhaler Place 2 puffs into alternate nostrils 2 (two) times daily. 10/19/2014: Received from: External Pharmacy Received Sig:   . Tdap (BOOSTRIX) 5-2.5-18.5 LF-MCG/0.5 injection Inject 0.5 mLs into the muscle once.   . vitamin B-12 (CYANOCOBALAMIN) 1000 MCG tablet Take 1,000 mcg by mouth daily.   . vitamin C (ASCORBIC ACID) 500 MG tablet Take 500 mg by mouth daily.    No facility-administered encounter medications on file as of 01/29/2016.    Functional Status:  In your present state of health, do you have any difficulty performing the following activities: 01/08/2016 12/10/2015  Hearing? N N  Vision? Y N  Difficulty concentrating or making decisions? N N  Walking   or climbing stairs? N N  Dressing or bathing? N N  Doing errands, shopping? Y N  Preparing Food and eating ? N -  Using the Toilet? N -  In the past six months, have you accidently leaked urine? N -  Do you have problems with loss of bowel control? N -  Managing your Medications? N -  Managing your Finances? N -  Housekeeping or managing your Housekeeping? N -    Fall/Depression Screening: PHQ 2/9 Scores 01/08/2016 12/14/2015 12/14/2015 10/19/2014 06/30/2014 06/11/2013  PHQ - 2 Score 0 0 0 0 0 0   Fall Risk  01/08/2016 12/14/2015 12/14/2015 10/27/2015 07/17/2015  Falls in the past year? No No No No No    [facility] CM Care Plan Problem One        Most Recent Value   Care Plan Problem One  Patient has elevated blood pressure.    Role Documenting the Problem One  Care Management Telephonic Coordinator   Care Plan for Problem One  Active   [facility]N Long Term Goal (31-90 days)  Patient will not have inpatient admission for hypertension  within 31 days of case management start date.    [facility] Long Term Goal Start Date  01/08/16   [facility] Long Term Goal Met Date  -- [Patient has not had any admissions since 12/09/15.]   Interventions for Problem One Long Term Goal  RNCM educated patient on signs and symptoms to report to MD.    [facility] CM Short Term Goal #1 (0-30 days)  Patient will report blood pressure above goal of 150/90 within 24 hours or immediately if symptomatic to primary MD.    [facility] CM Short Term Goal #1 Start Date  01/08/16   [facility] CM Short Term Goal #1 Met Date  01/26/16 [Patient is reporting elevated blood pressures to MD.]   Interventions for Short Term Goal #1  RNCM educated patient to monitor, record, daily blood pressure readings, before and after blood pressure medication administration.    [facility] CM Short Term Goal #2 (0-30 days)  Patient will report heart rate below 47 within 24 hours or immediately if symptomatic to primary MD.   [facility] CM Short Term Goal #2 Start Date  01/08/16   [facility] CM Short Term Goal #2 Met Date  -- [Patient's heart rate is 50 today. ]   Interventions for Short Term Goal #2  RNCM educated patient to monitor, record, daily heart rate readings, before and after blood pressure medication administration.      Assessment: Received EMMI Stroke red flag alert on 12/14/15. Report date: 12/13/15. Telephonic RNCM will follow patient for next 30 days for EMMI Stroke program if needed. Telephonic RNCM EMMI Stroke program end date: 01/14/16. Patient transitioned to [facility] Care Management Hypertension program on 01/08/16.   Patient will continue to receive [facility] Care Management services for hypertension disease education, management, and monitoring.   Plan: RNCM will call patient within 1 week to follow up on blood pressure readings. RNCM will follow up on answering patient's questions regarding [facility] Care Management consent form with patient within 1 week. RNCM will follow up with patient within 1 week for completion of  hypertension assessment.  RNCM will assess need for [facility] Community RNCM home visit for blood pressure monitoring.     H.  RN, BSN, CCM [facility] Care Management [facility] Telephonic CM Phone: [phone] Fax: [phone]   

## 2016-02-01 ENCOUNTER — Other Ambulatory Visit: Payer: Self-pay | Admitting: Internal Medicine

## 2016-02-01 ENCOUNTER — Telehealth: Payer: Self-pay | Admitting: *Deleted

## 2016-02-01 DIAGNOSIS — I10 Essential (primary) hypertension: Secondary | ICD-10-CM

## 2016-02-01 MED ORDER — NEBIVOLOL HCL 20 MG PO TABS: 20.0000 mg | ORAL_TABLET | Freq: Every day | ORAL | Status: DC

## 2016-02-01 NOTE — Telephone Encounter (Signed)
-----   Message from Gayland Curry, DO sent at 02/01/2016 2:20 PM EDT -----    Increase bystolic to 20mg  po daily. Continue amlopidine also. Continue bp checks at least one hour after taking her morning medications.

## 2016-02-01 NOTE — Telephone Encounter (Signed)
Patient called regarding her blood pressure. Patient takes blood pressure first thing in the morning before taking her medications. Has been taking the medications as prescribed. When she takes her medications it comes down about 15 points but starts rising again in the afternoon. Please Advise.  01/26/16- 180/73 01/27/16- 186/70 01/28/16- 189/74 01/29/16- 187/69 01/30/16- 205/75 01/31/16- 190/79 02/01/16- 199/79

## 2016-02-01 NOTE — Telephone Encounter (Signed)
Patient called back to give BP readings after she took her medication one hour later  01/26/16   160/70 01/27/16    157/70 01/28/16     166/73 01/29/16     Didn't get it 01/30/16     185/75 01/31/16      148/64 02/01/16       Hasn't gotten yet  To [name]

## 2016-02-01 NOTE — Telephone Encounter (Signed)
Spoke with patient and advised results, she understood and will call if anything changes

## 2016-02-05 ENCOUNTER — Telehealth: Payer: Self-pay | Admitting: *Deleted

## 2016-02-05 DIAGNOSIS — I10 Essential (primary) hypertension: Secondary | ICD-10-CM

## 2016-02-05 MED ORDER — NEBIVOLOL HCL 20 MG PO TABS: 20.0000 mg | ORAL_TABLET | Freq: Every day | ORAL | Status: DC

## 2016-02-05 NOTE — Telephone Encounter (Signed)
Patient called and requested Refill on her Bystolic

## 2016-02-06 ENCOUNTER — Ambulatory Visit (INDEPENDENT_AMBULATORY_CARE_PROVIDER_SITE_OTHER): Payer: Medicare Other | Admitting: Neurology

## 2016-02-06 ENCOUNTER — Encounter: Payer: Self-pay | Admitting: Neurology

## 2016-02-06 VITALS — Ht 62.0 in | Wt 133.2 lb

## 2016-02-06 DIAGNOSIS — I6340 Cerebral infarction due to embolism of unspecified cerebral artery: Secondary | ICD-10-CM

## 2016-02-06 DIAGNOSIS — E785 Hyperlipidemia, unspecified: Secondary | ICD-10-CM | POA: Insufficient documentation

## 2016-02-06 DIAGNOSIS — R002 Palpitations: Secondary | ICD-10-CM

## 2016-02-06 DIAGNOSIS — I10 Essential (primary) hypertension: Secondary | ICD-10-CM

## 2016-02-06 DIAGNOSIS — I639 Cerebral infarction, unspecified: Secondary | ICD-10-CM

## 2016-02-06 NOTE — Progress Notes (Signed)
STROKE NEUROLOGY FOLLOW UP NOTE  NAME: [name] [name] DOB: August 07, 1924  REASON FOR VISIT: stroke follow up HISTORY FROM: chart and pt  Today we had the pleasure of seeing [name] [name] in follow-up at our Neurology Clinic. Pt was accompanied by daughter.   History Summary [name] [name] is a 82 y.o. female with history of HTN, PVD, asthma, anxiety admitted on 12/09/15 for vertigo. Symptoms resolved quickly. She did not receive IV t-PA due to resolution of deficits. However, MRI showed left cerebellar cortex and left temporoparietal punctate infarcts, embolic pattern. MRA head and neck unremarkable. TTE unremarkable, LDL 80 and A1C 5.7. She was put on plavix and low dose lipitor on discharge.   Interval History During the interval time, the patient has been doing well. No recurrent stroke like symptoms. However, her BP not in good control. Worked with PCP and currently on amlodipine 5mg  (she can not tolerate with 10mg ) and bystolic. Today BP 162/72. No recurrent vertigo. Walk with cane and steady.     REVIEW OF SYSTEMS: Full 14 system review of systems performed and notable only for those listed below and in HPI above, all others are negative:  Constitutional:   Cardiovascular:  Ear/Nose/Throat:   Skin:  Eyes:  Light sensitivity Respiratory:   Gastroitestinal:   Genitourinary:  Hematology/Lymphatic:   Endocrine:  Musculoskeletal:   Allergy/Immunology:   Neurological:   Psychiatric: decreased concentration Sleep: restless leg  The following represents the patient's updated allergies and side effects list: Allergies  Allergen Reactions  . Aspirin     sweats  . Codeine   . Iodine     Pt is not aware of this allergy, does not recall much info....//a.c.  . Prednisone     sweats  . Sulfa Antibiotics     Doesn't remember    The neurologically relevant items on the patient's problem list were reviewed on today's visit.  Neurologic Examination  A problem focused  neurological exam (12 or more points of the single system neurologic examination, vital signs counts as 1 point, cranial nerves count for 8 points) was performed.  Height 5\' 2"  (1.575 m), weight 133 lb 3.2 oz (60.419 kg).  General - Well nourished, well developed, in no apparent distress.  Ophthalmologic - Fundi not visualized due to light sensitivity.  Cardiovascular - Regular rate and rhythm.  Mental Status -  Level of arousal and orientation to time, place, and person were intact. Language including expression, naming, repetition, comprehension was assessed and found intact. Fund of Knowledge was assessed and was intact.  Cranial Nerves II - XII - II - Visual field intact OU. III, IV, VI - Extraocular movements intact. V - Facial sensation intact bilaterally. VII - Facial movement intact bilaterally. VIII - Hearing & vestibular intact bilaterally. X - Palate elevates symmetrically. XI - Chin turning & shoulder shrug intact bilaterally. XII - Tongue protrusion intact.  Motor Strength - The patient's strength was normal in all extremities and pronator drift was absent.  Bulk was normal and fasciculations were absent.   Motor Tone - Muscle tone was assessed at the neck and appendages and was normal  Reflexes - The patient's reflexes were 1+ in all extremities and she had no pathological reflexes.  Sensory - Light touch, temperature/pinprick were assessed and were normal.    Coordination - The patient had normal movements in the hands and feet with no ataxia or dysmetria.  Tremor was absent.  Gait and Station - walk with cane,  slow and small stride, steady no fall.   Functional score  mRS = 1   0 - No symptoms.   1 - No significant disability. Able to carry out all usual activities, despite some symptoms.   2 - Slight disability. Able to look after own affairs without assistance, but unable to carry out all previous activities.   3 - Moderate disability. Requires some  help, but able to walk unassisted.   4 - Moderately severe disability. Unable to attend to own bodily needs without assistance, and unable to walk unassisted.   5 - Severe disability. Requires constant nursing care and attention, bedridden, incontinent.   6 - Dead.   NIH Stroke Scale   Level Of Consciousness 0=Alert; keenly responsive 1=Not alert, but arousable by minor stimulation 2=Not alert, requires repeated stimulation 3=Responds only with reflex movements 0  LOC Questions to Month and Age 43=Answers both questions correctly 1=Answers one question correctly 2=Answers neither question correctly 0  LOC Commands      -Open/Close eyes     -Open/close grip 0=Performs both tasks correctly 1=Performs one task correctly 2=Performs neighter task correctly 0  Best Gaze 0=Normal 1=Partial gaze palsy 2=Forced deviation, or total gaze paresis 0  Visual 0=No visual loss 1=Partial hemianopia 2=Complete hemianopia 3=Bilateral hemianopia (blind including cortical blindness) 0  Facial Palsy 0=Normal symmetrical movement 1=Minor paralysis (asymmetry) 2=Partial paralysis (lower face) 3=Complete paralysis (upper and lower face) 0  Motor  0=No drift, limb holds posture for full 10 seconds 1=Drift, limb holds posture, no drift to bed 2=Some antigravity effort, cannot maintain posture, drifts to bed 3=No effort against gravity, limb falls 4=No movement Right Arm 0     Leg 0    Left Arm 0     Leg 0  Limb Ataxia 0=Absent 1=Present in one limb 2=Present in two limbs 0  Sensory 0=Normal 1=Mild to moderate sensory loss 2=Severe to total sensory loss 0  Best Language 0=No aphasia, normal 1=Mild to moderate aphasia 2=Mute, global aphasia 3=Mute, global aphasia 0  Dysarthria 0=Normal 1=Mild to moderate 2=Severe, unintelligible or mute/anarthric 0  Extinction/Neglect 0=No abnormality 1=Extinction to bilateral simultaneous stimulation 2=Profound neglect 0  Total   0     Data  reviewed: I personally reviewed the images and agree with the radiology interpretations.   Ct Head Wo Contrast 12/09/2015  Atrophy with small vessel chronic ischemic changes of deep cerebral white matter. No acute intracranial abnormalities.  MRA HEAD  12/10/2015  Negative intracranial MRA of the brain. No large or proximal arterial branch occlusion. No high-grade or correctable stenosis.   MRA NECK  12/10/2015  1. No flow limiting stenosis or other acute abnormality within the major arterial vasculature of the neck.  2. Tortuosity of the arterial vasculature of the neck, suggesting chronic underlying hypertension.   Mr Brain Wo Contrast 12/09/2015  Punctate acute infarction in the peripheral left cerebellum and in the peripheral left posterior temporal region, consistent with micro embolic infarctions in the posterior circulation. No large infarction, swelling or hemorrhage. Chronic small vessel ischemic changes elsewhere throughout the brain, similar to the study of 2015.   2D Echocardiogram  - Left ventricle: The cavity size was normal. Wall thickness wasnormal. Systolic function was normal. The estimated ejectionfraction was in the range of 60% to 65%. Wall motion was normal;there were no regional wall motion abnormalities. - Pulmonic valve: There was moderate regurgitation. - Pulmonary arteries: Systolic pressure was mildly increased. PApeak pressure: 32 mm Hg (S). Impressions: No cardiac source of  emboli was indentified. Compared to theprior study, there has been no significant interval change.  Component     Latest Ref Rng 07/17/2015 12/10/2015  Cholesterol     0 - 200 mg/dL  173  Triglycerides     <150 mg/dL  34  HDL Cholesterol     >40 mg/dL  86  Total CHOL/HDL Ratio       2.0  VLDL     0 - 40 mg/dL  7  LDL (calc)     0 - 99 mg/dL  80  Hemoglobin A1C     4.8 - 5.6 % 5.5 5.7 (H)  Est. average glucose Bld gHb Est-mCnc      111   Mean Plasma Glucose        117    Assessment: As you may recall, she is a 82 y.o. African American female with PMH of HTN, PVD, asthma, anxiety admitted on 12/09/15 for vertigo. Symptoms resolved quickly. MRI showed left cerebellar cortex and left temporoparietal punctate infarcts, embolic pattern. MRA head and neck unremarkable. TTE unremarkable, LDL 80 and A1C 5.7. She was put on plavix and low dose lipitor on discharge. During the interval time, the patient has been doing well. No recurrent stroke like symptoms. However, her BP not in good control, currently working with PCP for that.  Plan:  - continue plavix and lipitor for stroke prevention for now - will do 30 day cardiac event monitoring to rule out afib. - check BP at home and record and bring over to [name] for medication adjustment if needed - Follow up with your primary care physician for stroke risk factor modification. Recommend maintain blood pressure goal 130/80, diabetes with hemoglobin A1c goal below 6.5% and lipids with LDL cholesterol goal below 70 mg/dL.  - healthy diet and regular exercise - follow up in 3 months.  I spent more than 25 minutes of face to face time with the patient. Greater than 50% of time was spent in counseling and coordination of care. We reviewed MRI images, discussed about further stroke work up with cardiac event monitoring, and BP management with PCP.    Orders Placed This Encounter  Procedures  . Cardiac event monitor    Standing Status: Future     Number of Occurrences:      Standing Expiration Date: 02/06/2017    Scheduling Instructions:     Request cardionet setup. Thank you.    Order Specific Question:  Where should this test be performed?    Answer:  [facility]    No orders of the defined types were placed in this encounter.    Patient Instructions  - continue plavix and lipitor for stroke prevention for now - need 30 day cardiac monitoring to rule out afib. If afib found, we need give you stronger blood  thinners - check BP at home and record and bring over to [name] for medication adjustment if needed - Follow up with your primary care physician for stroke risk factor modification. Recommend maintain blood pressure goal 130/80, diabetes with hemoglobin A1c goal below 6.5% and lipids with LDL cholesterol goal below 70 mg/dL.  - healthy diet and regular exercise - follow up in 3 months.     Rosalin Hawking, MD PhD [facility] [address], [place], [place] [address] [phone]

## 2016-02-06 NOTE — Patient Instructions (Signed)
-   continue plavix and lipitor for stroke prevention for now - need 30 day cardiac monitoring to rule out afib. If afib found, we need give you stronger blood thinners - check BP at home and record and bring over to [name] for medication adjustment if needed - Follow up with your primary care physician for stroke risk factor modification. Recommend maintain blood pressure goal 130/80, diabetes with hemoglobin A1c goal below 6.5% and lipids with LDL cholesterol goal below 70 mg/dL.  - healthy diet and regular exercise - follow up in 3 months.

## 2016-02-08 ENCOUNTER — Ambulatory Visit (INDEPENDENT_AMBULATORY_CARE_PROVIDER_SITE_OTHER): Payer: Medicare Other

## 2016-02-08 ENCOUNTER — Other Ambulatory Visit: Payer: Self-pay | Admitting: *Deleted

## 2016-02-08 DIAGNOSIS — R002 Palpitations: Secondary | ICD-10-CM | POA: Diagnosis not present

## 2016-02-08 NOTE — Patient Outreach (Signed)
[facility]) Care Management  [place]  02/08/2016   [name] [name] 09/13/1924 [id number]  Subjective: Telephone call to patient's home number, no answer, left HIPAA compliant voice mail message, and requested call back.    Objective: Per Epic case review: Patient hospitalized 12/09/15 - 12/11/15 for CVA. Patient's last appointment with cardiologist provider Truitt Merle NP was on 12/13/15. Per MD's office notes, patient prescribed Bystolic 5mg  po daily on 12/27/15 and decreased Amlodipine 5 mg po daily instead of 10mg  po daily. Patient to call MD if blood pressure not under 150/90 within 3 days. Per primary MD, still wants patient's blood pressure to be better and will discuss with patient at next appointment. Patient blood pressure medication increased this week due to elevated blood pressures.   Encounter Medications:  Outpatient Encounter Prescriptions as of 02/08/2016  Medication Sig Note  . acetaminophen (TYLENOL) 500 MG tablet Take 1 tablet (500 mg total) by mouth every 6 (six) hours as needed for moderate pain.   Marland Kitchen albuterol (PROVENTIL HFA;VENTOLIN HFA) 108 (90 BASE) MCG/ACT inhaler Inhale 2 puffs into the lungs every 6 (six) hours as needed for wheezing.   Marland Kitchen alum hydroxide-mag trisilicate (GAVISCON) [id number] MG CHEW chewable tablet Chew 1 tablet by mouth 3 (three) times daily as needed for flatulence (bowel spasms).   Marland Kitchen amLODipine (NORVASC) 5 MG tablet TAKE 1 TABLET (5 MG TOTAL) BY MOUTH DAILY.   Marland Kitchen atorvastatin (LIPITOR) 10 MG tablet Take 1 tablet (10 mg total) by mouth daily at 6 PM.   . bimatoprost (LUMIGAN) 0.01 % SOLN Place 1 drop into both eyes at bedtime.   . brinzolamide (AZOPT) 1 % ophthalmic suspension Place 1 drop into both eyes 3 (three) times daily. Reported on 01/08/2016   . Calcium Carbonate-Vitamin D (CALTRATE 600+D) 600-400 MG-UNIT per tablet Take 2 tablets by mouth daily.   . clopidogrel (PLAVIX) 75 MG tablet Take 1 tablet (75 mg total) by  mouth daily.   . Coenzyme Q10 (COQ10) 50 MG CAPS Take one tablet by mouth once daily   . fluticasone (FLONASE) 50 MCG/ACT nasal spray Place 1 spray into the nose daily.   . hydrocortisone (ANUSOL-HC) 2.5 % rectal cream Place 1 application rectally 2 (two) times daily.   . Lansoprazole (PREVACID PO) Take by mouth daily.   Marland Kitchen loteprednol (LOTEMAX) 0.5 % ophthalmic suspension Place 1 drop into both eyes daily.    . Multiple Vitamins-Minerals (OCUVITE PRESERVISION PO) Take by mouth. Reported on 01/08/2016   . Multiple Vitamins-Minerals (SENIOR MULTIVITAMIN PLUS) TABS Take by mouth.   . Nebivolol HCl 20 MG TABS Take 1 tablet (20 mg total) by mouth daily. To control blood pressure   . Omega-3 Fatty Acids (FISH OIL) 1200 MG CPDR Take one tablet by mouth once daily   . RESTASIS 0.05 % ophthalmic emulsion Place 1 drop into both eyes 2 (two) times daily.  06/15/2014: Received from: External Pharmacy  . risperiDONE (RISPERDAL) 0.5 MG tablet TAKE 1 TABLET BY MOUTH TWICE A DAY   . senna-docusate (SENOKOT-S) 8.6-50 MG tablet Take 1 tablet by mouth daily.   . SYMBICORT 80-4.5 MCG/ACT inhaler Place 2 puffs into alternate nostrils 2 (two) times daily. 10/19/2014: Received from: External Pharmacy Received Sig:   . vitamin B-12 (CYANOCOBALAMIN) 1000 MCG tablet Take 1,000 mcg by mouth daily.   . vitamin C (ASCORBIC ACID) 500 MG tablet Take 500 mg by mouth daily.    No facility-administered encounter medications on file as of 02/08/2016.  Functional Status:  In your present state of health, do you have any difficulty performing the following activities: 01/08/2016 12/10/2015  Hearing? N N  Vision? Y N  Difficulty concentrating or making decisions? N N  Walking or climbing stairs? N N  Dressing or bathing? N N  Doing errands, shopping? Y N  Preparing Food and eating ? N -  Using the Toilet? N -  In the past six months, have you accidently leaked urine? N -  Do you have problems with loss of bowel control? N -   Managing your Medications? N -  Managing your Finances? N -  Housekeeping or managing your Housekeeping? N -    Fall/Depression Screening: PHQ 2/9 Scores 01/08/2016 12/14/2015 12/14/2015 10/19/2014 06/30/2014 06/11/2013  PHQ - 2 Score 0 0 0 0 0 0   Fall Risk  02/06/2016 01/08/2016 12/14/2015 12/14/2015 10/27/2015  Falls in the past year? No No No No No    Assessment: Received EMMI Stroke red flag alert on 12/14/15. Report date: 12/13/15. Telephonic RNCM will follow patient for next 30 days for [facility] Stroke program if needed. Telephonic RNCM EMMI Stroke program end date: 01/14/16. Patient transitioned to [place] Management Hypertension program on 01/08/16. Patient will continue to receive [place] Management services for hypertension disease education, management, and monitoring.   Plan: RNCM will call patient within 1 week for 2nd attempt outreach,  to follow up on blood pressure readings. RNCM will follow up on answering patient's questions regarding [place] Management consent form with patient within 1 week. RNCM will follow up with patient within 1 week for completion of hypertension assessment.  RNCM will assess need for [place] home visit for blood pressure monitoring.    Aayana Reinertsen H. Annia Friendly, BSN, [place] Management [facility] Telephonic CM Phone: [phone] Fax: [phone]

## 2016-02-09 ENCOUNTER — Other Ambulatory Visit: Payer: Self-pay | Admitting: *Deleted

## 2016-02-09 NOTE — Patient Outreach (Addendum)
[facility]) Care Management  [place]  02/09/2016   [name] [name] [birth year] [id number]  Subjective: Received voicemail message from patient and states she is returning RNCM's call from 02/08/16. Telephone call to patient's home number, spoke with patient, and HIPAA verified.    Patient states she is doing well but verify tired.   States she has been very busy the last 2 days and is currently resting.   Patient requested call back on 02/12/16.   Patient will continue to receive [place] Management services.   Objective:  Per Epic case review: Patient hospitalized 12/09/15 - 12/11/15 for CVA. Patient's last appointment with cardiologist provider Truitt Merle NP was on 12/13/15. Per MD's office notes, patient prescribed Bystolic 97m po daily on 35/80/99and decreased Amlodipine 5 mg po daily instead of 134mpo daily. Patient to call MD if blood pressure not under 150/90 within 3 days. Per primary MD, still wants patient's blood pressure to be better and will discuss with patient at next appointment. Patient blood pressure medication increased this week due to elevated blood pressures.  Encounter Medications:  Outpatient Encounter Prescriptions as of 02/09/2016  Medication Sig Note  . acetaminophen (TYLENOL) 500 MG tablet Take 1 tablet (500 mg total) by mouth every 6 (six) hours as needed for moderate pain.   . Marland Kitchenlbuterol (PROVENTIL HFA;VENTOLIN HFA) 108 (90 BASE) MCG/ACT inhaler Inhale 2 puffs into the lungs every 6 (six) hours as needed for wheezing.   . Marland Kitchenlum hydroxide-mag trisilicate (GAVISCON) 8083-38G CHEW chewable tablet Chew 1 tablet by mouth 3 (three) times daily as needed for flatulence (bowel spasms).   . Marland KitchenmLODipine (NORVASC) 5 MG tablet TAKE 1 TABLET (5 MG TOTAL) BY MOUTH DAILY.   . Marland Kitchentorvastatin (LIPITOR) 10 MG tablet Take 1 tablet (10 mg total) by mouth daily at 6 PM.   . bimatoprost (LUMIGAN) 0.01 % SOLN Place 1 drop into both eyes at bedtime.   . brinzolamide  (AZOPT) 1 % ophthalmic suspension Place 1 drop into both eyes 3 (three) times daily. Reported on 01/08/2016   . Calcium Carbonate-Vitamin D (CALTRATE 600+D) 600-400 MG-UNIT per tablet Take 2 tablets by mouth daily.   . clopidogrel (PLAVIX) 75 MG tablet Take 1 tablet (75 mg total) by mouth daily.   . Coenzyme Q10 (COQ10) 50 MG CAPS Take one tablet by mouth once daily   . fluticasone (FLONASE) 50 MCG/ACT nasal spray Place 1 spray into the nose daily.   . hydrocortisone (ANUSOL-HC) 2.5 % rectal cream Place 1 application rectally 2 (two) times daily.   . Lansoprazole (PREVACID PO) Take by mouth daily.   . Marland Kitchenoteprednol (LOTEMAX) 0.5 % ophthalmic suspension Place 1 drop into both eyes daily.    . Multiple Vitamins-Minerals (OCUVITE PRESERVISION PO) Take by mouth. Reported on 01/08/2016   . Multiple Vitamins-Minerals (SENIOR MULTIVITAMIN PLUS) TABS Take by mouth.   . Nebivolol HCl 20 MG TABS Take 1 tablet (20 mg total) by mouth daily. To control blood pressure   . Omega-3 Fatty Acids (FISH OIL) 1200 MG CPDR Take one tablet by mouth once daily   . RESTASIS 0.05 % ophthalmic emulsion Place 1 drop into both eyes 2 (two) times daily.  06/15/2014: Received from: External Pharmacy  . risperiDONE (RISPERDAL) 0.5 MG tablet TAKE 1 TABLET BY MOUTH TWICE A DAY   . senna-docusate (SENOKOT-S) 8.6-50 MG tablet Take 1 tablet by mouth daily.   . SYMBICORT 80-4.5 MCG/ACT inhaler Place 2 puffs into alternate nostrils 2 (two)  times daily. 10/19/2014: Received from: External Pharmacy Received Sig:   . vitamin B-12 (CYANOCOBALAMIN) 1000 MCG tablet Take 1,000 mcg by mouth daily.   . vitamin C (ASCORBIC ACID) 500 MG tablet Take 500 mg by mouth daily.    No facility-administered encounter medications on file as of 02/09/2016.    Functional Status:  In your present state of health, do you have any difficulty performing the following activities: 01/08/2016 12/10/2015  Hearing? N N  Vision? Y N  Difficulty concentrating or making  decisions? N N  Walking or climbing stairs? N N  Dressing or bathing? N N  Doing errands, shopping? Y N  Preparing Food and eating ? N -  Using the Toilet? N -  In the past six months, have you accidently leaked urine? N -  Do you have problems with loss of bowel control? N -  Managing your Medications? N -  Managing your Finances? N -  Housekeeping or managing your Housekeeping? N -    Fall/Depression Screening: PHQ 2/9 Scores 01/08/2016 12/14/2015 12/14/2015 10/19/2014 06/30/2014 06/11/2013  PHQ - 2 Score 0 0 0 0 0 0   Fall Risk  02/06/2016 01/08/2016 12/14/2015 12/14/2015 10/27/2015  Falls in the past year? No No No No No   THN CM Care Plan Problem One        Most Recent Value   Care Plan Problem One  Patient has elevated blood pressure.    Role Documenting the Problem One  Care Management Telephonic Coordinator   Care Plan for Problem One  Active   THN Long Term Goal (31-90 days)  Patient will not have inpatient admission for hypertension within 31 days of case management start date.    THN Long Term Goal Start Date  01/08/16   THN Long Term Goal Met Date  -- [place] has not had any admissions since 12/09/15.]   Interventions for Problem One Long Term Goal  RNCM educated patient on signs and symptoms to report to MD.    [facility] CM Short Term Goal #1 (0-30 days)  Patient will report blood pressure above goal of 150/90 within 24 hours or immediately if symptomatic to primary MD.    [facility] CM Short Term Goal #1 Start Date  01/08/16   [facility] CM Short Term Goal #1 Met Date  01/26/16 [Patient is reporting elevated blood pressures to MD.]   Interventions for Short Term Goal #1  RNCM educated patient to monitor, record, daily blood pressure readings, before and after blood pressure medication administration.    [facility]N CM Short Term Goal #2 (0-30 days)  Patient will report heart rate below 47 within 24 hours or immediately if symptomatic to primary MD.   [facility] CM Short Term Goal #2 Start Date  01/08/16   THN CM Short  Term Goal #2 Met Date  -- [Patient's heart rate not discussed today. ]   Interventions for Short Term Goal #2  RNCM educated patient to monitor, record, daily heart rate readings, before and after blood pressure medication administration.      Assessment: Received EMMI Stroke red flag alert on 12/14/15. Report date: 12/13/15. Telephonic RNCM will follow patient for next 30 days for [facility] Stroke program if needed. Telephonic RNCM EMMI Stroke program end date: 01/14/16. Patient transitioned to [place] Management Hypertension program on 01/08/16. Patient will continue to receive [place] Management services for hypertension disease education, management, and monitoring.   Plan: RNCM will call patient within 1 week for 3rd attempt outreach,  to follow up on blood pressure readings. RNCM will follow up on answering patient's questions regarding [place] Management consent form with patient within 1 week.  RNCM will follow up with patient within 1 week for completion of hypertension assessment.  RNCM will assess need for [place] home visit for blood pressure monitoring.    Robertha Staples H. Annia Friendly, BSN, [place] Management [facility] Telephonic CM Phone: [phone] Fax: [phone]

## 2016-02-12 ENCOUNTER — Telehealth: Payer: Self-pay | Admitting: *Deleted

## 2016-02-12 ENCOUNTER — Other Ambulatory Visit: Payer: Self-pay | Admitting: *Deleted

## 2016-02-12 ENCOUNTER — Telehealth: Payer: Self-pay | Admitting: Neurology

## 2016-02-12 NOTE — Patient Outreach (Addendum)
[facility]) Care Management  [place]  02/12/2016   COLTON ENGDAHL 1923-10-09 [id number]  Subjective: Telephone call to patient's home number, spoke with patient, and HIPAA verified.   Patient states she is doing well, no dizziness or headaches.  States she has called the Dr. Cyndi Lennert office today regarding blood pressure readings above 150/ 90 and is waiting for a call back.  States her blood pressure reading today prior to medication was 195/75 and after the medication was 139/65.   States she is continuing to take Amlodipine 5 mg daily, Bystolic 20 mg daily, and blood pressure continues to go up and down.  Patient states she was diagnosed with hypertension in 1989 and was first treatment for her hypertension by Dr. Jeanann Lewandowsky.   States she has had problems with blood pressure for years and started back on Bystolic after last hospitalization and was only on 1 medication (Amlodipine) prior to hospitalization.   Patient does not feel like she needs a [place] home visit at this time and RNCM will continue to monitor patient telephonically.   Patient states she had the cardiac event monitor placed on 02/08/16, in Dr. Phoebe Sharps office, will wear monitor for 30 days, and has done well wearing it, until 2:00am this morning.   States she became very hot, sweaty, and was unable to go back to sleep for a couple of hours.  RNCM advised patient to follow up with Dr. Phoebe Sharps office and Dr. Cyndi Lennert office regarding her symptoms, to verify if related to heart monitor or elevated blood pressure.   Patient voiced understanding and states she will follow up.  Patient will continue to receive [place] Management services.    Objective:  Per Epic case review: Patient hospitalized 12/09/15 - 12/11/15 for CVA. Patient's last appointment with cardiologist provider Truitt Merle NP was on 12/13/15. Per MD's office notes, patient prescribed Bystolic 43m po daily on 39/02/40and decreased Amlodipine 5  mg po daily instead of 142mpo daily. Patient to call MD if blood pressure not under 150/90 within 3 days. Per primary MD, still wants patient's blood pressure to be better and will discuss with patient at next appointment. Patient blood pressure medication increased this week due to elevated blood pressures.  Encounter Medications:  Outpatient Encounter Prescriptions as of 02/12/2016  Medication Sig Note  . amLODipine (NORVASC) 5 MG tablet TAKE 1 TABLET (5 MG TOTAL) BY MOUTH DAILY.   . Marland Kitchencetaminophen (TYLENOL) 500 MG tablet Take 1 tablet (500 mg total) by mouth every 6 (six) hours as needed for moderate pain.   . Marland Kitchenlbuterol (PROVENTIL HFA;VENTOLIN HFA) 108 (90 BASE) MCG/ACT inhaler Inhale 2 puffs into the lungs every 6 (six) hours as needed for wheezing.   . Marland Kitchenlum hydroxide-mag trisilicate (GAVISCON) 8097-35G CHEW chewable tablet Chew 1 tablet by mouth 3 (three) times daily as needed for flatulence (bowel spasms).   . Marland Kitchentorvastatin (LIPITOR) 10 MG tablet Take 1 tablet (10 mg total) by mouth daily at 6 PM.   . bimatoprost (LUMIGAN) 0.01 % SOLN Place 1 drop into both eyes at bedtime.   . brinzolamide (AZOPT) 1 % ophthalmic suspension Place 1 drop into both eyes 3 (three) times daily. Reported on 01/08/2016   . Calcium Carbonate-Vitamin D (CALTRATE 600+D) 600-400 MG-UNIT per tablet Take 2 tablets by mouth daily.   . clopidogrel (PLAVIX) 75 MG tablet Take 1 tablet (75 mg total) by mouth daily.   . Coenzyme Q10 (COQ10) 50 MG CAPS  Take one tablet by mouth once daily   . fluticasone (FLONASE) 50 MCG/ACT nasal spray Place 1 spray into the nose daily.   . hydrocortisone (ANUSOL-HC) 2.5 % rectal cream Place 1 application rectally 2 (two) times daily.   . Lansoprazole (PREVACID PO) Take by mouth daily.   Marland Kitchen loteprednol (LOTEMAX) 0.5 % ophthalmic suspension Place 1 drop into both eyes daily.    . Multiple Vitamins-Minerals (OCUVITE PRESERVISION PO) Take by mouth. Reported on 01/08/2016   . Multiple  Vitamins-Minerals (SENIOR MULTIVITAMIN PLUS) TABS Take by mouth.   . Nebivolol HCl 20 MG TABS Take 1 tablet (20 mg total) by mouth daily. To control blood pressure   . Omega-3 Fatty Acids (FISH OIL) 1200 MG CPDR Take one tablet by mouth once daily   . RESTASIS 0.05 % ophthalmic emulsion Place 1 drop into both eyes 2 (two) times daily.  06/15/2014: Received from: External Pharmacy  . risperiDONE (RISPERDAL) 0.5 MG tablet TAKE 1 TABLET BY MOUTH TWICE A DAY   . senna-docusate (SENOKOT-S) 8.6-50 MG tablet Take 1 tablet by mouth daily.   . SYMBICORT 80-4.5 MCG/ACT inhaler Place 2 puffs into alternate nostrils 2 (two) times daily. 10/19/2014: Received from: External Pharmacy Received Sig:   . vitamin B-12 (CYANOCOBALAMIN) 1000 MCG tablet Take 1,000 mcg by mouth daily.   . vitamin C (ASCORBIC ACID) 500 MG tablet Take 500 mg by mouth daily.    No facility-administered encounter medications on file as of 02/12/2016.    Functional Status:  In your present state of health, do you have any difficulty performing the following activities: 01/08/2016 12/10/2015  Hearing? N N  Vision? Y N  Difficulty concentrating or making decisions? N N  Walking or climbing stairs? N N  Dressing or bathing? N N  Doing errands, shopping? Y N  Preparing Food and eating ? N -  Using the Toilet? N -  In the past six months, have you accidently leaked urine? N -  Do you have problems with loss of bowel control? N -  Managing your Medications? N -  Managing your Finances? N -  Housekeeping or managing your Housekeeping? N -    Fall/Depression Screening: PHQ 2/9 Scores 01/08/2016 12/14/2015 12/14/2015 10/19/2014 06/30/2014 06/11/2013  PHQ - 2 Score 0 0 0 0 0 0   Fall Risk  02/06/2016 01/08/2016 12/14/2015 12/14/2015 10/27/2015  Falls in the past year? No No No No No   THN CM Care Plan Problem One        Most Recent Value   Care Plan Problem One  Patient has elevated blood pressure.    Role Documenting the Problem One  Care Management  Telephonic Coordinator   Care Plan for Problem One  Active   THN Long Term Goal (31-90 days)  Patient will not have inpatient admission for hypertension within 31 days of case management start date.    THN Long Term Goal Start Date  01/08/16   THN Long Term Goal Met Date  -- [facility] has not had any admissions since 12/09/15,goal ongoing]   Interventions for Problem One Long Term Goal  RNCM educated patient on signs and symptoms to report to MD.    [facility] CM Short Term Goal #1 (0-30 days)  Patient will report blood pressure above goal of 150/90 within 24 hours or immediately if symptomatic to primary MD.    [facility] CM Short Term Goal #1 Start Date  01/08/16   [facility] CM Short Term Goal #1 Met Date  01/26/16 [Patient is reporting elevated blood pressures to MD.]   Interventions for Short Term Goal #1  RNCM educated patient to monitor, record, daily blood pressure readings, before and after blood pressure medication administration.    THN CM Short Term Goal #2 (0-30 days)  Patient will report heart rate below 47 within 24 hours or immediately if symptomatic to primary MD.   [facility] CM Short Term Goal #2 Start Date  02/12/16 [Goal is ongoing.]   THN CM Short Term Goal #2 Met Date  -- [Patient's heart rate not discussed today.  Goal ongoing. ]   Interventions for Short Term Goal #2  RNCM educated patient to monitor, record, daily heart rate readings, before and after blood pressure medication administration.       Assessment: Received EMMI Stroke red flag alert on 12/14/15. Report date: 12/13/15. Telephonic RNCM will follow patient for next 30 days for [facility] Stroke program if needed. Telephonic RNCM EMMI Stroke program end date: 01/14/16. Patient transitioned to [place] [facility] Hypertension program on 01/08/16. Patient will continue to receive [place] Management services for hypertension disease education, management, and monitoring.   Plan: RNCM will call patient within 2 weeks for outreach, verify current  blood pressure medication regimen and to follow up on blood pressure readings. RNCM will follow up with patient within 2 weeks to review medication list.  RNCM will follow up on answering patient's questions regarding [place] Management consent form with patient within 2 weeks.  RNCM will follow up with patient within 2 weeks regarding cardiac event monitoring status.  RNCM will follow up with patient within 2 weeks regarding heart rate readings.   Romuald Mccaslin H. Annia Friendly, BSN, [place] Management [facility] Telephonic CM Phone: [phone] Fax: [phone]

## 2016-02-12 NOTE — Progress Notes (Signed)
This encounter was created in error - please disregard.

## 2016-02-12 NOTE — Telephone Encounter (Signed)
Patient called and stated that you told her to call if her BP was up over 150/90. Stated it will come down for a day and then go back up.  Patient is taking Bystolic and Amlodipine. Please Advise.   12/07/15- 170/82 02/07/16- 156/69 02/08/16- 187/77 02/09/16- 167/77 02/10/16-136/67 02/11/16- 139/65

## 2016-02-12 NOTE — Telephone Encounter (Signed)
Spoke with patient and she is taking Norvasc 5mg  and bystolic 20 daily, please advise

## 2016-02-12 NOTE — Telephone Encounter (Signed)
It should be amlodipine 5mg  daily and bystolic 20mg  daily.  Is that the amount she's taking?  It seems like the increase in meds has not really affected the BP.

## 2016-02-12 NOTE — Telephone Encounter (Signed)
Rn call patient back about the heart monitor making her hot. Rn stated most patients dont complain of the heart monitor making them hot. Rn stated the heart monitor is to monitor the heart rate. Rn stated to patient to adjust the air in her house, and maybe not sleep with a thick comforter while wearing a heart monitor. Rn stated the heart monitor is only for 30 days. Rn stated if she continues to be hot, to contact her PCP to be evaluated. Pt verbalized.

## 2016-02-12 NOTE — Telephone Encounter (Signed)
Pt called said she has done well on the heart monitor for 2 days and 2 nights but last night she woke about 2am hot and sweating and did not go back to sleep until about 5am. Please call

## 2016-02-13 NOTE — Telephone Encounter (Signed)
I'm concerned that she is not taking the medication properly b/c her bp does not get better despite increasing doses and seems to stay almost the same.  Can we reach out to her daughter to see if she is able to fill pillboxes for her mom (If she is not already doing this)?

## 2016-02-14 NOTE — Telephone Encounter (Signed)
Spoke with [name] [name], patient is very deligent about taking medications properly. [name] [name] is confident that her mother is taking medications daily as prescribed. Patient seen Dr Erlinda Hong on May 2,2017 and that's when Bystolic was increased to 20 mg. Please advise

## 2016-02-19 NOTE — Telephone Encounter (Signed)
I would like for her to come in with all of her medications and with any pillboxes she uses so we can make sure she is taking things properly.  Her bp should improve when meds are added.

## 2016-02-20 ENCOUNTER — Encounter: Payer: Self-pay | Admitting: *Deleted

## 2016-02-20 ENCOUNTER — Other Ambulatory Visit: Payer: Self-pay | Admitting: *Deleted

## 2016-02-20 NOTE — Patient Outreach (Signed)
[facility]) Care Management  [place]  02/20/2016   [name] [name] July 05, 1924 [id number]  Subjective:  Telephone call to patient's home number, spoke with patient, and HIPAA verified.  Patient states she is doing very well today.  Patient states her blood pressure is still running high, no headaches, or dizziness.   States the following blood pressure readings: 190/83 prior to medications on 02/20/16, heart rate 49, then post reading was 169/77, heart rate 45.   164/69 prior to medications on 02/19/16, heart rate 50 and post medication 150/71 and did not have the post heart rate recorded.   Patient states [name] has spoken with patient's daughter regarding blood pressure elevations and has not made any new adjustments to medications since last conversation with RNCM.   Patient states her next follow up with MD is in July of 2017 and is not aware of a sooner appointment.   Patient states she feels like she is bothering MD when she reporting her blood pressure because it is staying high so much.   RNCM encouraged patient to continue to report elevated blood pressure reading to MD as MD had instructed and educated patient on the increase risk for stroke related to blood pressure elevation.   Patient voices understanding and states she will continue follow up with MD has directed.  RNCM advised patient that RNCM will also follow up with MD to see if [place] Management could assist further with blood pressure home monitoring.  Patient states her heart monitoring is going well and no further issues.   Patient will continue to receive [place] Management services.   Objective: Per Epic case review: Patient hospitalized 12/09/15 - 12/11/15 for CVA. Patient's last appointment with cardiologist provider Truitt Merle NP was on 12/13/15. Per MD's office notes, patient prescribed Bystolic 24m po daily on 38/14/48and decreased Amlodipine 5 mg po daily instead of 114mpo daily. Patient  to call MD if blood pressure not under 150/90 within 3 days. Per primary MD, still wants patient's blood pressure to be better and will discuss with patient at next appointment. Patient blood pressure medication increased this week due to elevated blood pressures.  MD has spoken with patient's daughter regarding elevated blood pressures and continues to monitor.   Encounter Medications:  Outpatient Encounter Prescriptions as of 02/20/2016  Medication Sig Note  . acetaminophen (TYLENOL) 500 MG tablet Take 1 tablet (500 mg total) by mouth every 6 (six) hours as needed for moderate pain.   . Marland Kitchenlbuterol (PROVENTIL HFA;VENTOLIN HFA) 108 (90 BASE) MCG/ACT inhaler Inhale 2 puffs into the lungs every 6 (six) hours as needed for wheezing.   . Marland Kitchenlum hydroxide-mag trisilicate (GAVISCON) 8018-56G CHEW chewable tablet Chew 1 tablet by mouth 3 (three) times daily as needed for flatulence (bowel spasms).   . Marland KitchenmLODipine (NORVASC) 5 MG tablet TAKE 1 TABLET (5 MG TOTAL) BY MOUTH DAILY.   . Marland Kitchentorvastatin (LIPITOR) 10 MG tablet Take 1 tablet (10 mg total) by mouth daily at 6 PM.   . bimatoprost (LUMIGAN) 0.01 % SOLN Place 1 drop into both eyes at bedtime.   . brinzolamide (AZOPT) 1 % ophthalmic suspension Place 1 drop into both eyes 3 (three) times daily. Reported on 01/08/2016   . Calcium Carbonate-Vitamin D (CALTRATE 600+D) 600-400 MG-UNIT per tablet Take 2 tablets by mouth daily.   . clopidogrel (PLAVIX) 75 MG tablet Take 1 tablet (75 mg total) by mouth daily.   . Coenzyme Q10 (COQ10) 50  MG CAPS Take one tablet by mouth once daily   . fluticasone (FLONASE) 50 MCG/ACT nasal spray Place 1 spray into the nose daily.   . hydrocortisone (ANUSOL-HC) 2.5 % rectal cream Place 1 application rectally 2 (two) times daily.   . Lansoprazole (PREVACID PO) Take by mouth daily.   Marland Kitchen loteprednol (LOTEMAX) 0.5 % ophthalmic suspension Place 1 drop into both eyes daily.    . Multiple Vitamins-Minerals (OCUVITE PRESERVISION PO) Take by  mouth. Reported on 01/08/2016   . Multiple Vitamins-Minerals (SENIOR MULTIVITAMIN PLUS) TABS Take by mouth.   . Nebivolol HCl 20 MG TABS Take 1 tablet (20 mg total) by mouth daily. To control blood pressure   . Omega-3 Fatty Acids (FISH OIL) 1200 MG CPDR Take one tablet by mouth once daily   . RESTASIS 0.05 % ophthalmic emulsion Place 1 drop into both eyes 2 (two) times daily.  06/15/2014: Received from: External Pharmacy  . risperiDONE (RISPERDAL) 0.5 MG tablet TAKE 1 TABLET BY MOUTH TWICE A DAY   . senna-docusate (SENOKOT-S) 8.6-50 MG tablet Take 1 tablet by mouth daily.   . SYMBICORT 80-4.5 MCG/ACT inhaler Place 2 puffs into alternate nostrils 2 (two) times daily. 10/19/2014: Received from: External Pharmacy Received Sig:   . vitamin B-12 (CYANOCOBALAMIN) 1000 MCG tablet Take 1,000 mcg by mouth daily.   . vitamin C (ASCORBIC ACID) 500 MG tablet Take 500 mg by mouth daily.    No facility-administered encounter medications on file as of 02/20/2016.    Functional Status:  In your present state of health, do you have any difficulty performing the following activities: 01/08/2016 12/10/2015  Hearing? N N  Vision? Y N  Difficulty concentrating or making decisions? N N  Walking or climbing stairs? N N  Dressing or bathing? N N  Doing errands, shopping? Y N  Preparing Food and eating ? N -  Using the Toilet? N -  In the past six months, have you accidently leaked urine? N -  Do you have problems with loss of bowel control? N -  Managing your Medications? N -  Managing your Finances? N -  Housekeeping or managing your Housekeeping? N -    Fall/Depression Screening: PHQ 2/9 Scores 01/08/2016 12/14/2015 12/14/2015 10/19/2014 06/30/2014 06/11/2013  PHQ - 2 Score 0 0 0 0 0 0   Fall Risk  02/06/2016 01/08/2016 12/14/2015 12/14/2015 10/27/2015  Falls in the past year? _0    THN CM Care Plan Problem One        Most Recent Value   Care Plan Problem One  Patient has elevated blood pressure.    Role  Documenting the Problem One  Care Management Telephonic Coordinator   Care Plan for Problem One  Active   THN Long Term Goal (31-90 days)  Patient will not have inpatient admission for hypertension within 31 days of case management start date.    THN Long Term Goal Start Date  01/08/16   [facility] Long Term Goal Met Date  02/20/16 [Patient has not had any admissions since 12/09/15.]   Interventions for Problem One Long Term Goal  RNCM educated patient on signs and symptoms to report to MD.    [facility] CM Short Term Goal #1 (0-30 days)  Patient will report blood pressure above goal of 150/90 within 24 hours or immediately if symptomatic to primary MD.    [facility] CM Short Term Goal #1 Start Date  01/08/16   [facility] CM Short Term Goal #1 Met Date  01/26/16 [Patient is reporting elevated blood pressures to MD.]   Interventions for Short Term Goal #1  RNCM educated patient to monitor, record, daily blood pressure readings, before and after blood pressure medication administration.    THN CM Short Term Goal #2 (0-30 days)  Patient will report heart rate below 47 within 24 hours or immediately if symptomatic to primary MD.   [facility] CM Short Term Goal #2 Start Date  02/12/16 [Goal is ongoing.]   THN CM Short Term Goal #2 Met Date  -- [Patient's heart rate 49 -45 today.  Goal ongoing. ]   Interventions for Short Term Goal #2  RNCM educated patient to monitor, record, daily heart rate readings, before and after blood pressure medication administration.  Patient encouraged to continue to follow up with MD as directed .       Assessment: Received EMMI Stroke red flag alert on 12/14/15. Report date: 12/13/15. Telephonic RNCM will follow patient for next 30 days for [facility] Stroke program if needed. Telephonic RNCM EMMI Stroke program end date: 01/14/16. Patient transitioned to [place] Management Hypertension program on 01/08/16. Patient will continue to receive [place] Management services for hypertension disease education,  management, and monitoring.   Plan: RNCM will follow up with patient's MD within 4 weeks to verify if [place] Management can further assist with blood pressure home monitoring.  RNCM will call patient within 4 weeks for outreach, verify current blood pressure medication regimen and to follow up on blood pressure readings. RNCM will follow up with patient within 4 weeks to review medication list.  RNCM will follow up on answering patient's questions regarding [place] Management consent form with patient within 4 weeks.  RNCM will follow up with patient within 4 weeks regarding cardiac event monitoring status.  RNCM will follow up with patient within 4 weeks regarding heart rate readings and verify low readings being reported to MD.   Colbert Coyer. Annia Friendly, BSN, [place] Management [facility] Telephonic CM Phone: [phone] Fax: [phone]

## 2016-02-22 NOTE — Telephone Encounter (Signed)
Called spoke with patient about medication and BP and she states her BP is still elevated. I advised pt needs to schedule an appt to come see [name] to discuss, per pt she needs to get a ride and will call back for appt

## 2016-02-22 NOTE — Telephone Encounter (Signed)
Pt called back and scheduled appt at 11:30 Monday, her driver will drop her off at 11am

## 2016-02-26 ENCOUNTER — Ambulatory Visit (INDEPENDENT_AMBULATORY_CARE_PROVIDER_SITE_OTHER): Payer: Medicare Other | Admitting: Internal Medicine

## 2016-02-26 ENCOUNTER — Encounter: Payer: Self-pay | Admitting: Internal Medicine

## 2016-02-26 VITALS — BP 120/60 | HR 44 | Temp 97.6°F | Wt 137.0 lb

## 2016-02-26 DIAGNOSIS — I639 Cerebral infarction, unspecified: Secondary | ICD-10-CM

## 2016-02-26 DIAGNOSIS — I10 Essential (primary) hypertension: Secondary | ICD-10-CM

## 2016-02-26 DIAGNOSIS — K219 Gastro-esophageal reflux disease without esophagitis: Secondary | ICD-10-CM | POA: Diagnosis not present

## 2016-02-26 DIAGNOSIS — J4520 Mild intermittent asthma, uncomplicated: Secondary | ICD-10-CM | POA: Diagnosis not present

## 2016-02-26 MED ORDER — PANTOPRAZOLE SODIUM 40 MG PO TBEC: 40.0000 mg | DELAYED_RELEASE_TABLET | Freq: Every day | ORAL | Status: DC

## 2016-02-26 NOTE — Progress Notes (Signed)
Location:  [facility] clinic Provider:  Klay Sobotka [name], D.O., C.M.D.  Code Status: DNR Goals of Care:  Advanced Directives 02/26/2016  Does patient have an advance directive? No  Type of Advance Directive -  Does patient want to make changes to advanced directive? -  Copy of advanced directive(s) in chart? -     Chief Complaint  Patient presents with  . Medical Management of Chronic Issues    follow-up to discuss medications    HPI: Patient is a 82 y.o. female seen today for medical management of chronic diseases.  BP readings at home have been elevated, but here within normal range.  Has had meter at home for 2 years.  Has not changed batteries recently so recommended that first, but if not helping, replace machine.  Not dizzy, but pulse on low side--44 is the lowest it's been.  Says she has a little headache if it's low.    Otherwise doing well.  Using her cane.  Walks slowly.   Sleeping ok.  Eating well.    No wheezing or sob.  Sometimes feels like it's coming, but it doesn't.    GERD:  About the same.  Still taking the prevacid.  Sometimes thinks it doesn't work as well as it used to.     Past Medical History  Diagnosis Date  . Benign essential hypertension   . Osteoarthritis   . GERD (gastroesophageal reflux disease)   . Seasonal allergies   . Asthma   . Glaucoma   . PVD (peripheral vascular disease) ([place])   . Anxiety   . Stroke [facility])     Past Surgical History  Procedure Laterality Date  . Abdominal hysterectomy  1981  . Tonsillectomy  1953    Allergies  Allergen Reactions  . Aspirin     sweats  . Codeine   . Iodine     Pt is not aware of this allergy, does not recall much info....//a.c.  . Prednisone     sweats  . Sulfa Antibiotics     Doesn't remember      Medication List       This list is accurate as of: 02/26/16 11:09 AM.  Always use your most recent med list.               acetaminophen 500 MG tablet  Commonly known as:  TYLENOL  Take 1  tablet (500 mg total) by mouth every 6 (six) hours as needed for moderate pain.     albuterol 108 (90 Base) MCG/ACT inhaler  Commonly known as:  PROVENTIL HFA;VENTOLIN HFA  Inhale 2 puffs into the lungs every 6 (six) hours as needed for wheezing.     alum hydroxide-mag trisilicate [id number] MG Chew chewable tablet  Commonly known as:  GAVISCON  Chew 1 tablet by mouth 3 (three) times daily as needed for flatulence (bowel spasms).     amLODipine 5 MG tablet  Commonly known as:  NORVASC  TAKE 1 TABLET (5 MG TOTAL) BY MOUTH DAILY.     atorvastatin 10 MG tablet  Commonly known as:  LIPITOR  Take 1 tablet (10 mg total) by mouth daily at 6 PM.     bimatoprost 0.01 % Soln  Commonly known as:  LUMIGAN  Place 1 drop into both eyes at bedtime.     brinzolamide 1 % ophthalmic suspension  Commonly known as:  AZOPT  Place 1 drop into both eyes 3 (three) times daily. Reported on 01/08/2016  CALTRATE 600+D 600-400 MG-UNIT tablet  Generic drug:  Calcium Carbonate-Vitamin D  Take 2 tablets by mouth daily.     clopidogrel 75 MG tablet  Commonly known as:  PLAVIX  Take 1 tablet (75 mg total) by mouth daily.     CoQ10 50 MG Caps  Take one tablet by mouth once daily     Fish Oil 1200 MG Cpdr  Take one tablet by mouth once daily     fluticasone 50 MCG/ACT nasal spray  Commonly known as:  FLONASE  Place 1 spray into the nose daily.     hydrocortisone 2.5 % rectal cream  Commonly known as:  ANUSOL-HC  Place 1 application rectally 2 (two) times daily.     loteprednol 0.5 % ophthalmic suspension  Commonly known as:  LOTEMAX  Place 1 drop into both eyes daily.     Nebivolol HCl 20 MG Tabs  Take 1 tablet (20 mg total) by mouth daily. To control blood pressure     PREVACID PO  Take by mouth daily.     RESTASIS 0.05 % ophthalmic emulsion  Generic drug:  cycloSPORINE  Place 1 drop into both eyes 2 (two) times daily.     risperiDONE 0.5 MG tablet  Commonly known as:  RISPERDAL  TAKE 1  TABLET BY MOUTH TWICE A DAY     OCUVITE PRESERVISION PO  Take by mouth. Reported on 01/08/2016     SENIOR MULTIVITAMIN PLUS Tabs  Take by mouth.     senna-docusate 8.6-50 MG tablet  Commonly known as:  Senokot-S  Take 1 tablet by mouth daily.     SYMBICORT 80-4.5 MCG/ACT inhaler  Generic drug:  budesonide-formoterol  Place 2 puffs into alternate nostrils 2 (two) times daily.     vitamin B-12 1000 MCG tablet  Commonly known as:  CYANOCOBALAMIN  Take 1,000 mcg by mouth daily.     vitamin C 500 MG tablet  Commonly known as:  ASCORBIC ACID  Take 500 mg by mouth daily.        Review of Systems:  Review of Systems  Constitutional: Positive for malaise/fatigue. Negative for fever and chills.  HENT: Negative for congestion.   Eyes: Negative for blurred vision.       Glasses  Respiratory: Positive for cough. Negative for sputum production and shortness of breath.   Cardiovascular: Negative for chest pain and leg swelling.       Edema better with compression hose  Gastrointestinal: Positive for heartburn and constipation. Negative for nausea, vomiting, abdominal pain, diarrhea, blood in stool and melena.  Genitourinary: Negative for dysuria.  Musculoskeletal: Negative for myalgias and falls.  Skin: Negative for rash.  Neurological: Positive for headaches.  Psychiatric/Behavioral: Positive for memory loss. Negative for depression. The patient is nervous/anxious.        Paranoid delusions    Health Maintenance  Topic Date Due  . MAMMOGRAM  10/07/2018 (Originally 09/28/2009)  . ZOSTAVAX  10/07/2018 (Originally 10/04/1986)  . TETANUS/TDAP  10/07/2018 (Originally [birth year]/1945)  . INFLUENZA VACCINE  05/07/2016  . DEXA SCAN  Completed  . PNA vac Low Risk Adult  Completed    Physical Exam: Filed Vitals:   02/26/16 1051  BP: 120/60  Pulse: 44  Temp: 97.6 F (36.4 C)  TempSrc: Oral  Weight: 137 lb (62.143 kg)  SpO2: 97%   Body mass index is 25.05 kg/(m^2). Physical Exam    Constitutional: She is oriented to person, place, and time. She appears well-developed and well-nourished. No distress.  Cardiovascular: Normal rate, regular rhythm, normal heart sounds and intact distal pulses.   Pulmonary/Chest: Effort normal and breath sounds normal. No respiratory distress.  Abdominal: Soft. Bowel sounds are normal. She exhibits no distension. There is no tenderness.  Musculoskeletal: Normal range of motion.  Neurological: She is alert and oriented to person, place, and time.  Skin: Skin is warm and dry.  Psychiatric: She has a normal mood and affect.    Labs reviewed: Basic Metabolic Panel:  Recent Labs  03/20/15 1517 12/09/15 1414 12/09/15 1421  NA 143 142 142  K 4.2 3.9 3.9  CL 105 108 105  CO2 23 25  --   GLUCOSE 103* 122* 122*  BUN 20 20 22*  CREATININE 0.82 0.80 0.90  CALCIUM 9.3 9.3  --    Liver Function Tests:  Recent Labs  03/20/15 1517 12/09/15 1414  AST 17 22  ALT 14 18  ALKPHOS 64 63  BILITOT 0.7 0.9  PROT 6.4 6.6  ALBUMIN 4.2 3.7   No results for input(s): LIPASE, AMYLASE in the last 8760 hours. No results for input(s): AMMONIA in the last 8760 hours. CBC:  Recent Labs  03/20/15 1517 12/09/15 1414 12/09/15 1421  WBC 5.2 5.3  --   NEUTROABS 3.1 3.1  --   HGB  --  11.5* 12.6  HCT 32.8* 33.7* 37.0  MCV 91 90.1  --   PLT 235 211  --    Lipid Panel:  Recent Labs  12/10/15 0549  CHOL 173  HDL 86  LDLCALC 80  TRIG 34  CHOLHDL 2.0   Lab Results  Component Value Date   HGBA1C 5.7* 12/10/2015     Assessment/Plan 1. Benign essential hypertension -bp well controlled here -suspect pt's machine either needs batteries changed or needs to be replaced -cont amlodipine 5mg  daily and bystolic 20mg  daily  2. Asthma, mild intermittent, uncomplicated -stable, cont albuterol prn  3. Gastroesophageal reflux disease without esophagitis -prevacid is not working well anymore -advised to d/c prevacid and use protonix which  will go better with the plavix she was started on since her stroke - pantoprazole (PROTONIX) 40 MG tablet; Take 1 tablet (40 mg total) by mouth daily.  Dispense: 30 tablet; Refill: 3  Labs/tests ordered:  No orders of the defined types were placed in this encounter.    Next appt:  Cancel 7/17 and change to 3 month f/u   Tayron Hunnell L. Inez Stantz, D.O. [facility] [address], [place] [address] Cell Phone (Mon-Fri 8am-5pm):  [phone] On Call:  [phone] & follow prompts after 5pm & weekends Office Phone:  [phone] Office Fax:  [phone]

## 2016-02-26 NOTE — Patient Instructions (Signed)
Stop prevacid due to interaction with plavix and lack of efficacy.  Start protonix instead which is ok to take with the plavix.  Change the batteries in your blood pressure machine and check your bp daily for a week.  If readings are still higher than 150/90, then get a new machine because your blood pressure here is great!

## 2016-03-14 ENCOUNTER — Other Ambulatory Visit: Payer: Self-pay | Admitting: *Deleted

## 2016-03-14 NOTE — Patient Outreach (Addendum)
[facility]) Care Management  [place]  03/14/2016   [name] [name] 08/07/1924 [id number]  Subjective: Telephone call to patient's home number, spoke with patient, and HIPAA verified.  Patient states she is doing well, blood pressure better overall, and blood pressure was not so good earlier today.   States blood pressure 191/80, heart rate 52 today prior to medication and 153/69, heart rate 50 after medication.  States her post medication administration blood pressure range for the month of June has been 139 - 154 / 57- 74.   States MD is aware of low heart rate and will continue to monitor.    Patient states she does not have any headache or dizziness.  Patient states she saw primary MD on 02/26/16 and blood pressure was 120/60.  States MD had recommended changing blood pressure cuff batteries and/ or obtaining a new blood pressure cuff to see if this would change blood pressure readings.  States she has changed batteries in her blood pressure cuff as MD recommended and it has not made a difference in blood pressure readings.  States she has had several people take their blood pressure with her blood pressure cuff and they got their normal blood pressure readings.  Patient states she does not see a need to get a new blood pressure cuff.  Patient states she will continue to monitor blood pressure readings daily and report to MD as directed (BP over 150 /90).  States she taking medications as prescribed. Patient states her next appointment with primary MD (Dr. Mariea [name]) is on 04/22/16.  States she utilizes [employer] to transport her to her MD appointments.    States the heart monitor was removed on 03/08/16 and she has a 3 month follow up appointment.   Patient states she has received the [place] [facility] consent, will work on getting it filled out and mailed back to [facility].   RNCM verbally reviewed consent form with patient and patient had no questions or concerns.   RNCM advised patient RNCM will follow up with primary MD to update on patient blood pressure readings since battery change and see if there are any additional recommendations.  Patient has no additional community resources or educational needs at this time.  Patient in agreement to continue to to receive [place] Management services.  Telephone to Dr. Cyndi Lennert office, spoke with Rudene Re, transferred to triage line, left HIPAA compliant voicemail message, and requested call back.  Telephone call from [place] at Dr. Cyndi Lennert office, states she has reviewed RNCM's note in electronic medical record, will forward note to Dr. Mariea [name], and will advise RNCM if any changes needed.   [name] verified patient's lab appointment is on 04/22/16 and next appointment with Dr. Mariea [name] is on 04/25/16.    Objective: Per Epic case review: Patient hospitalized 12/09/15 - 12/11/15 for CVA. Patient's last appointment with cardiologist provider [name] Merle NP was on 12/13/15. Per MD's office notes, patient prescribed Bystolic 5mg  po daily on 12/27/15 and decreased Amlodipine 5 mg po daily instead of 10mg  po daily. Patient to call MD if blood pressure not under 150/90 within 3 days. Per primary MD, still wants patient's blood pressure to be better and will discuss with patient at next appointment. Patient blood pressure medication increased this week due to elevated blood pressures. MD has spoken with patient's daughter regarding elevated blood pressures and continues to monitor.   Encounter Medications:  Outpatient Encounter Prescriptions as of 03/14/2016  Medication Sig  Note  . acetaminophen (TYLENOL) 500 MG tablet Take 1 tablet (500 mg total) by mouth every 6 (six) hours as needed for moderate pain.   Marland Kitchen albuterol (PROVENTIL HFA;VENTOLIN HFA) 108 (90 BASE) MCG/ACT inhaler Inhale 2 puffs into the lungs every 6 (six) hours as needed for wheezing.   Marland Kitchen alum hydroxide-mag trisilicate (GAVISCON) [id number] MG CHEW chewable tablet Chew 1 tablet by  mouth 3 (three) times daily as needed for flatulence (bowel spasms).   Marland Kitchen amLODipine (NORVASC) 5 MG tablet TAKE 1 TABLET (5 MG TOTAL) BY MOUTH DAILY.   Marland Kitchen atorvastatin (LIPITOR) 10 MG tablet Take 1 tablet (10 mg total) by mouth daily at 6 PM.   . bimatoprost (LUMIGAN) 0.01 % SOLN Place 1 drop into both eyes at bedtime.   . brinzolamide (AZOPT) 1 % ophthalmic suspension Place 1 drop into both eyes 3 (three) times daily. Reported on 01/08/2016   . Calcium Carbonate-Vitamin D (CALTRATE 600+D) 600-400 MG-UNIT per tablet Take 2 tablets by mouth daily.   . clopidogrel (PLAVIX) 75 MG tablet Take 1 tablet (75 mg total) by mouth daily.   . Coenzyme Q10 (COQ10) 50 MG CAPS Take one tablet by mouth once daily   . fluticasone (FLONASE) 50 MCG/ACT nasal spray Place 1 spray into the nose daily.   . hydrocortisone (ANUSOL-HC) 2.5 % rectal cream Place 1 application rectally 2 (two) times daily.   Marland Kitchen loteprednol (LOTEMAX) 0.5 % ophthalmic suspension Place 1 drop into both eyes daily.  03/14/2016: Patient states taking as needed.   . Multiple Vitamins-Minerals (OCUVITE PRESERVISION PO) Take by mouth. Reported on 01/08/2016   . Multiple Vitamins-Minerals (SENIOR MULTIVITAMIN PLUS) TABS Take by mouth.   . Nebivolol HCl 20 MG TABS Take 1 tablet (20 mg total) by mouth daily. To control blood pressure   . Omega-3 Fatty Acids (FISH OIL) 1200 MG CPDR Take one tablet by mouth once daily   . pantoprazole (PROTONIX) 40 MG tablet Take 1 tablet (40 mg total) by mouth daily.   . RESTASIS 0.05 % ophthalmic emulsion Place 1 drop into both eyes 2 (two) times daily.    . risperiDONE (RISPERDAL) 0.5 MG tablet TAKE 1 TABLET BY MOUTH TWICE A DAY   . senna-docusate (SENOKOT-S) 8.6-50 MG tablet Take 1 tablet by mouth daily.   . SYMBICORT 80-4.5 MCG/ACT inhaler Place 2 puffs into alternate nostrils 2 (two) times daily.   . vitamin B-12 (CYANOCOBALAMIN) 1000 MCG tablet Take 1,000 mcg by mouth daily.   . vitamin C (ASCORBIC ACID) 500 MG tablet  Take 500 mg by mouth daily.    No facility-administered encounter medications on file as of 03/14/2016.    Functional Status:  In your present state of health, do you have any difficulty performing the following activities: 01/08/2016 12/10/2015  Hearing? N N  Vision? Y N  Difficulty concentrating or making decisions? N N  Walking or climbing stairs? N N  Dressing or bathing? N N  Doing errands, shopping? Y N  Preparing Food and eating ? N -  Using the Toilet? N -  In the past six months, have you accidently leaked urine? N -  Do you have problems with loss of bowel control? N -  Managing your Medications? N -  Managing your Finances? N -  Housekeeping or managing your Housekeeping? N -    Fall/Depression Screening: PHQ 2/9 Scores 02/26/2016 01/08/2016 12/14/2015 12/14/2015 10/19/2014 06/30/2014 06/11/2013  PHQ - 2 Score 0 0 0 0 0 0 0   Fall  Risk  02/26/2016 02/06/2016 01/08/2016 12/14/2015 12/14/2015  Falls in the past year? No No No No No    Assessment: Received EMMI Stroke red flag alert on 12/14/15. Report date: 12/13/15. Telephonic RNCM will follow patient for next 30 days for [facility] Stroke program if needed. Telephonic RNCM EMMI Stroke program end date: 01/14/16. Patient transitioned to [place] Management Hypertension program on 01/08/16. Patient will continue to receive [place] Management services for hypertension disease education, management, and monitoring.   Plan:RNCM will call patient within 4 weeks for outreach, verify current blood pressure medication regimen and to follow up on blood pressure readings. RNCM will follow up with patient within 4 weeks regarding heart rate readings and verify low readings being reported to MD.   Colbert Coyer. Annia Friendly, BSN, [place] [facility] Telephonic CM Phone: [phone] Fax: [phone]

## 2016-03-18 ENCOUNTER — Telehealth: Payer: Self-pay | Admitting: *Deleted

## 2016-03-18 NOTE — Telephone Encounter (Signed)
All I did was switch her reflux medication.  The protonix she's supposed to be on now should actually work better.  I stopped the omeprazole b/c she is on plavix.  Taking the omeprazole with the plavix will make it not work putting her at risk for stroke.    I don't believe her home blood pressures.  Apparently changing the battery in her machine did not help.

## 2016-03-18 NOTE — Telephone Encounter (Signed)
Patient called with BP Readings:  Stated to her it is doing better. Patient states the reflux medication tends to give her alittle heartburn. After she goes to bed about 30 minutes or so it really burns. She takes it before she eats her supper. Patient stated that she went back to the old Prevacid that she had been taking and it seems to be doing better. Should she continue this or should she try something else.  Please Advise.   03/18/16- 137/57 03/16/16-  168/74 141/64 03/15/16-  183/79 153/79 03/14/16-  191/80 153/69 03/13/16-  179/72 148/66 03/12/16-  177/65 150/68 03/11/16-  179/63 150/71 03/10/16-  184/74 03/09/16-  170/70 169/69

## 2016-03-22 NOTE — Telephone Encounter (Signed)
Spoke with patient and she's not sure what exactly is giving her heartburn. Pt was a little confused about the medications I asked if she could get the bottle and she confirmed the protonix, but pt is taking this at 5:00 pm, I advised that might be a little to late in the day. Please advise when pt should take this medication.

## 2016-03-23 NOTE — Telephone Encounter (Signed)
Let's try moving the protonix to 20 minutes before her breakfast and see how that goes.  Her memory is poor and she needs someone else to manage her medications and make sure she takes them.  She also has paranoid delusions sometimes.  Her daughter believes her when she says she takes her medications and pt may think she does, also, but I am suspicious.

## 2016-03-26 ENCOUNTER — Telehealth: Payer: Self-pay

## 2016-03-26 NOTE — Telephone Encounter (Signed)
-----   Message from Rosalin Hawking, MD sent at 03/24/2016 12:06 AM EDT ----- Could you please let the patient know that the 30 day cardiac event monitoring showed no afib. Please continue current treatment. Thanks.  Rosalin Hawking, MD PhD Stroke Neurology 03/24/2016 12:05 AM

## 2016-03-26 NOTE — Telephone Encounter (Signed)
Rn call patient about 30 day cardiac monitor. Rn stated it showed no atrial fibrillation. ALso to continue treatment plan. Pt verbalized understanding.[name] [name]

## 2016-03-28 NOTE — Telephone Encounter (Signed)
.  left message to have patient return my call.  

## 2016-04-04 ENCOUNTER — Other Ambulatory Visit: Payer: Self-pay | Admitting: Internal Medicine

## 2016-04-04 ENCOUNTER — Ambulatory Visit: Payer: Self-pay | Admitting: *Deleted

## 2016-04-05 ENCOUNTER — Other Ambulatory Visit: Payer: Self-pay | Admitting: *Deleted

## 2016-04-05 VITALS — BP 152/69

## 2016-04-05 DIAGNOSIS — I10 Essential (primary) hypertension: Secondary | ICD-10-CM

## 2016-04-05 NOTE — Patient Outreach (Signed)
[place] [facility]) Care Management  [place]  04/05/2016   [name] [name] Sep 25, 1924 [id number]  Subjective: Telephone call to patient's home number, spoke with patient, and HIPAA verified.  Patient states she is feeling well and still having problems with blood pressure reading fluctuations.   States she is compliant with all MD's and RNCM's recommendations: purchased new blood pressure monitor on 03/29/16, began using new monitor on 03/30/16, reporting readings to MD's office, taking medications as prescribed, records blood pressure readings daily before, and after blood pressure medicine administered.  States she is concerned that blood pressure readings in MD's office seem to be more normal than home blood pressure readings.    States blood pressure reading today pre medication was 177/76, pulse 46, and post medication was 152/69, pulse 42.    Patient states she is still having good and bad blood pressure reading with new monitor.  Blood pressure reading on 03/30/16 pre medication was 140/59, pulse 44 and post medication was 134/77, pulse 45.   States she does not have any headaches or dizziness.   RNCM advised patient to continue to report home blood pressure readings to MD's office even if blood pressure reading in MD's office are normal per MD's parameter.   Patient voices understanding and states she will continue to be compliant with all recommendations.   Patient in agreement with [place] referral for blood pressure monitoring, evaluation, and care coordination.   Patient in agreement with [place] referral for in home medication review, med monitoring, and medication management.   Patient will continue to receive [place] Management services.    Objective: Per Epic case review: Patient hospitalized 12/09/15 - 12/11/15 for CVA. Patient's last appointment with cardiologist provider Truitt Merle NP was on 12/13/15. Per MD's office  notes, patient prescribed Bystolic 5mg  po daily on 12/27/15 and decreased Amlodipine 5 mg po daily instead of 10mg  po daily. Patient to call MD if blood pressure not under 150/90 within 3 days. Per primary MD, still wants patient's blood pressure to be better and will discuss with patient at next appointment. Patient blood pressure medication increased this week due to elevated blood pressures. MD has spoken with patient's daughter regarding elevated blood pressures and continues to monitor.  Encounter Medications:  Outpatient Encounter Prescriptions as of 04/05/2016  Medication Sig Note  . acetaminophen (TYLENOL) 500 MG tablet Take 1 tablet (500 mg total) by mouth every 6 (six) hours as needed for moderate pain.   Marland Kitchen albuterol (PROVENTIL HFA;VENTOLIN HFA) 108 (90 BASE) MCG/ACT inhaler Inhale 2 puffs into the lungs every 6 (six) hours as needed for wheezing.   Marland Kitchen alum hydroxide-mag trisilicate (GAVISCON) [id number] MG CHEW chewable tablet Chew 1 tablet by mouth 3 (three) times daily as needed for flatulence (bowel spasms).   Marland Kitchen amLODipine (NORVASC) 5 MG tablet TAKE 1 TABLET (5 MG TOTAL) BY MOUTH DAILY.   Marland Kitchen atorvastatin (LIPITOR) 10 MG tablet TAKE 1 TABLET (10 MG TOTAL) BY MOUTH DAILY AT 6 PM.   . bimatoprost (LUMIGAN) 0.01 % SOLN Place 1 drop into both eyes at bedtime.   . brinzolamide (AZOPT) 1 % ophthalmic suspension Place 1 drop into both eyes 3 (three) times daily. Reported on 01/08/2016   . Calcium Carbonate-Vitamin D (CALTRATE 600+D) 600-400 MG-UNIT per tablet Take 2 tablets by mouth daily.   . clopidogrel (PLAVIX) 75 MG tablet Take 1 tablet (75 mg total) by mouth daily.   . Coenzyme Q10 (COQ10)  50 MG CAPS Take one tablet by mouth once daily   . fluticasone (FLONASE) 50 MCG/ACT nasal spray Place 1 spray into the nose daily.   . hydrocortisone (ANUSOL-HC) 2.5 % rectal cream Place 1 application rectally 2 (two) times daily.   Marland Kitchen loteprednol (LOTEMAX) 0.5 % ophthalmic suspension Place 1 drop into both  eyes daily.  03/14/2016: Patient states taking as needed.   . Multiple Vitamins-Minerals (OCUVITE PRESERVISION PO) Take by mouth. Reported on 01/08/2016   . Multiple Vitamins-Minerals (SENIOR MULTIVITAMIN PLUS) TABS Take by mouth.   . Nebivolol HCl 20 MG TABS Take 1 tablet (20 mg total) by mouth daily. To control blood pressure   . Omega-3 Fatty Acids (FISH OIL) 1200 MG CPDR Take one tablet by mouth once daily   . pantoprazole (PROTONIX) 40 MG tablet Take 1 tablet (40 mg total) by mouth daily.   . RESTASIS 0.05 % ophthalmic emulsion Place 1 drop into both eyes 2 (two) times daily.    . risperiDONE (RISPERDAL) 0.5 MG tablet TAKE 1 TABLET BY MOUTH TWICE A DAY   . senna-docusate (SENOKOT-S) 8.6-50 MG tablet Take 1 tablet by mouth daily.   . SYMBICORT 80-4.5 MCG/ACT inhaler Place 2 puffs into alternate nostrils 2 (two) times daily.   . vitamin B-12 (CYANOCOBALAMIN) 1000 MCG tablet Take 1,000 mcg by mouth daily.   . vitamin C (ASCORBIC ACID) 500 MG tablet Take 500 mg by mouth daily.    No facility-administered encounter medications on file as of 04/05/2016.    Functional Status:  In your present state of health, do you have any difficulty performing the following activities: 01/08/2016 12/10/2015  Hearing? N N  Vision? Y N  Difficulty concentrating or making decisions? N N  Walking or climbing stairs? N N  Dressing or bathing? N N  Doing errands, shopping? Y N  Preparing Food and eating ? N -  Using the Toilet? N -  In the past six months, have you accidently leaked urine? N -  Do you have problems with loss of bowel control? N -  Managing your Medications? N -  Managing your Finances? N -  Housekeeping or managing your Housekeeping? N -    Fall/Depression Screening: PHQ 2/9 Scores 02/26/2016 01/08/2016 12/14/2015 12/14/2015 10/19/2014 06/30/2014 06/11/2013  PHQ - 2 Score 0 0 0 0 0 0 0   Fall Risk  02/26/2016 02/06/2016 01/08/2016 12/14/2015 12/14/2015  Falls in the past year? No No No No No    Assessment:  Received EMMI Stroke red flag alert on 12/14/15. Report date: 12/13/15. Telephonic RNCM will follow patient for next 30 days for [facility] Stroke program if needed. Telephonic RNCM EMMI Stroke program end date: 01/14/16. Patient transitioned to [place] Management Hypertension program on 01/08/16. Patient will continue to receive [place] Management services for hypertension disease education, management, and monitoring via [place].  No has no further Telephonic RNCM needs at this time.   Plan:RNCM will refer patient to [place]  for blood pressure monitoring, evaluation, and care coordination.    RNCM will refer patient to [place] for in home medication review, med monitoring, and medication management.   Haralambos Yeatts H. Annia Friendly, BSN, [place] Management [facility] Telephonic CM Phone: [phone] Fax: [phone]

## 2016-04-08 ENCOUNTER — Other Ambulatory Visit: Payer: Self-pay

## 2016-04-08 NOTE — Patient Outreach (Signed)
[facility]) Care Management [place] Telephone Outreach  04/08/2016  [name] [name] 1924-09-19 [id number]  Successful outreach made to patient at her home number. Educated regarding confidentiality and verified patient identify via DOB and address. Educated patient regarding purpose of call and [place] and patient agreeable with services.  Patient stated that she lives alone and has been having high blood pressures. Stated that she would like to get them down. Stated that she monitors her blood pressure at home daily. Today's reading was 153/69. Patient is asymptomatic, denying headaches, numbness or tingling, chest pain or shortness of breath.   Patient denies any other significant medical history. Patient is agreeable for home visit for [facility] to complete assessment and provide education regarding hypertension and assist with blood pressure monitoring.   Plan: Will follow up with patient this week for a home visit.  [place] R. Isaah Furry, RN, BSN, [place] Management Coordinator [phone]

## 2016-04-08 NOTE — Telephone Encounter (Signed)
Spoke with patient and she's doing much better with the acid reflux, she is still having a problem with the blood pressure and will discuss on her visit 7/20

## 2016-04-12 ENCOUNTER — Other Ambulatory Visit: Payer: Self-pay

## 2016-04-15 ENCOUNTER — Other Ambulatory Visit: Payer: Self-pay

## 2016-04-15 NOTE — Patient Outreach (Signed)
[name] [name] [name] [name]) Care Management  [name] [name]   04/15/2016  [name] [name] 1923/10/10 [id number]  Subjective: [name] [name] is an 82 year old female who was referred to me for medication review and management.  [name] [name] is very knowledgeable about her medications.  She keeps her morning medications in one drawer and her nighttime medications in a different drawer.  She knows why she is taking her medications.  She reports good adherence to her medications as well.  Her major concern is her blood pressure.  Her blood pressure was 139/61 this morning but was elevated when I took it with her machine.  She has no other complaints today. Her albuterol was out of date and she had 192 puffs left on it.  I suggested she follow up with her provider to see if she could get a sample since she does not use it very often.    Objective:  Filed Vitals:   04/15/16 1434  BP: 167/75  Pulse: 44    Encounter Medications: Outpatient Encounter Prescriptions as of 04/15/2016  Medication Sig Note  . acetaminophen (TYLENOL) 500 MG tablet Take 1 tablet (500 mg total) by mouth every 6 (six) hours as needed for moderate pain.   Marland Kitchen albuterol (PROVENTIL HFA;VENTOLIN HFA) 108 (90 BASE) MCG/ACT inhaler Inhale 2 puffs into the lungs every 6 (six) hours as needed for wheezing.   Marland Kitchen alum hydroxide-mag trisilicate (GAVISCON) 30-86 MG CHEW chewable tablet Chew 1 tablet by mouth 3 (three) times daily as needed for flatulence (bowel spasms).   Marland Kitchen amLODipine (NORVASC) 5 MG tablet TAKE 1 TABLET (5 MG TOTAL) BY MOUTH DAILY.   Marland Kitchen atorvastatin (LIPITOR) 10 MG tablet TAKE 1 TABLET (10 MG TOTAL) BY MOUTH DAILY AT 6 PM.   . B Complex Vitamins (B COMPLEX PO) Take 1 tablet by mouth daily.   . bimatoprost (LUMIGAN) 0.01 % SOLN Place 1 drop into both eyes at bedtime.   . brinzolamide (AZOPT) 1 % ophthalmic suspension Place 1 drop into both eyes 3 (three) times daily. Reported on 01/08/2016   . Calcium Carbonate-Vitamin D  (CALTRATE 600+D) 600-400 MG-UNIT per tablet Take 2 tablets by mouth daily.   . Cholecalciferol (VITAMIN D3) 2000 units TABS Take 2,000 Units by mouth daily.   . clopidogrel (PLAVIX) 75 MG tablet Take 1 tablet (75 mg total) by mouth daily.   . Coenzyme Q-10 100 MG capsule Take 100 mg by mouth daily.   . fluticasone (FLONASE) 50 MCG/ACT nasal spray Place 1 spray into the nose daily.   . hydrocortisone (ANUSOL-HC) 2.5 % rectal cream Place 1 application rectally 2 (two) times daily.   Marland Kitchen loteprednol (LOTEMAX) 0.5 % ophthalmic suspension Place 1 drop into both eyes daily.  03/14/2016: Patient states taking as needed.   . Multiple Vitamins-Minerals (SENIOR MULTIVITAMIN PLUS) TABS Take by mouth.   . Nebivolol HCl 20 MG TABS Take 1 tablet (20 mg total) by mouth daily. To control blood pressure   . Omega-3 Fatty Acids (FISH OIL) 1200 MG CPDR Take one tablet by mouth once daily   . pantoprazole (PROTONIX) 40 MG tablet Take 1 tablet (40 mg total) by mouth daily.   . RESTASIS 0.05 % ophthalmic emulsion Place 1 drop into both eyes 2 (two) times daily.    . risperiDONE (RISPERDAL) 0.5 MG tablet TAKE 1 TABLET BY MOUTH TWICE A DAY   . senna-docusate (SENOKOT-S) 8.6-50 MG tablet Take 1 tablet by mouth daily. 04/12/2016: Patient stated that she takes as  needed  . SYMBICORT 80-4.5 MCG/ACT inhaler Place 2 puffs into alternate nostrils 2 (two) times daily.   . vitamin C (ASCORBIC ACID) 500 MG tablet Take 500 mg by mouth daily.   . [DISCONTINUED] Coenzyme Q10 (COQ10) 50 MG CAPS Take one tablet by mouth once daily   . [DISCONTINUED] Multiple Vitamins-Minerals (OCUVITE PRESERVISION PO) Take by mouth. Reported on 01/08/2016   . [DISCONTINUED] vitamin B-12 (CYANOCOBALAMIN) 1000 MCG tablet Take 1,000 mcg by mouth daily.    No facility-administered encounter medications on file as of 04/15/2016.    Functional Status: In your present state of health, do you have any difficulty performing the following activities: 04/15/2016  01/08/2016  Hearing? N N  Vision? Y Y  Difficulty concentrating or making decisions? Y N  Walking or climbing stairs? Y N  Dressing or bathing? N N  Doing errands, shopping? [name]  Preparing Food and eating ? N N  Using the Toilet? N N  In the past six months, have you accidently leaked urine? N N  Do you have problems with loss of bowel control? N N  Managing your Medications? N N  Managing your Finances? N N  Housekeeping or managing your Housekeeping? N N    Fall/Depression Screening: PHQ 2/9 Scores 04/12/2016 02/26/2016 01/08/2016 12/14/2015 12/14/2015 10/19/2014 06/30/2014  PHQ - 2 Score 0 0 0 0 0 0 0    Assessment:  Drugs sorted by system:  Neurologic/Psychologic: risperidone  Cardiovascular: atorvastatin, clopidogrel, amlodipine, nebivolol  Pulmonary/Allergy: albuterol, fluticasone, symbicort  Gastrointestinal: gaviscon, pantoprazole   Endocrine: none  Renal: anusol  Topical: hydrocorticone  Pain: acetaminophen  Vitamins/Minerals: calcium carbonate, vitamin D, coenzyme Q10, multivitamin, omega 3 fatty acids, B complex , vitamin C  Infectious Diseases: none  Miscellaneous: bimatorprost, brinzolamide, loteprednol, senna-docusate, restasis   Duplications in therapy: none Gaps in therapy: none Medications to avoid in the elderly: none Drug interactions: none Other issues noted: none  Plan: 1. No major drug interactions or problems noted.  She has no questions or concerns for me. Her provider is aware of her blood pressure issues.  I recommended for her to take her machine to the provider at her next visit to have it checked against the provider's blood pressure.   2. I am going to close her to pharmacy since all her pharmacy needs have been met. I am happy to assist in the future with any additional pharmacy needs.   [name] [name], PharmD, [place] [phone]

## 2016-04-16 ENCOUNTER — Encounter: Payer: Self-pay | Admitting: Podiatry

## 2016-04-16 ENCOUNTER — Ambulatory Visit (INDEPENDENT_AMBULATORY_CARE_PROVIDER_SITE_OTHER): Payer: Medicare Other | Admitting: Podiatry

## 2016-04-16 ENCOUNTER — Other Ambulatory Visit: Payer: Self-pay | Admitting: Internal Medicine

## 2016-04-16 DIAGNOSIS — R739 Hyperglycemia, unspecified: Secondary | ICD-10-CM

## 2016-04-16 DIAGNOSIS — M79676 Pain in unspecified toe(s): Secondary | ICD-10-CM

## 2016-04-16 DIAGNOSIS — B351 Tinea unguium: Secondary | ICD-10-CM

## 2016-04-16 DIAGNOSIS — Q828 Other specified congenital malformations of skin: Secondary | ICD-10-CM

## 2016-04-16 DIAGNOSIS — I10 Essential (primary) hypertension: Secondary | ICD-10-CM

## 2016-04-16 NOTE — Progress Notes (Signed)
[name], female   DOB: [birth year], 82 y.o.   MRN: [id number] Complaint:  Visit Type: Patient returns to my office for continued preventative foot care services. Complaint: Patient states" my nails have grown long and thick and become painful to walk and wear shoes" Patient has been diagnosed with neuropathy both feet.. The patient presents for preventative foot care services. No changes to ROS.  She has painful callus both feet which is painful walking in her shoes  Podiatric Exam: Vascular: dorsalis pedis and posterior tibial pulses are palpable bilateral. Capillary return is immediate. Temperature gradient is WNL. Skin turgor WNL  Sensorium: Normal Semmes Weinstein monofilament test. Normal tactile sensation bilaterally. Nail Exam: Pt has thick disfigured discolored nails with subungual debris noted bilateral entire nail hallux through fifth toenails Ulcer Exam: There is no evidence of ulcer or pre-ulcerative changes or infection. Orthopedic Exam: Muscle tone and strength are WNL. No limitations in general ROM. No crepitus or effusions noted. Foot type and digits show no abnormalities. Bony prominences are unremarkable. Skin: porokeratosis sub 1,3 right foot and fourth toe left.    Onychomycosis, , Pain in right toe, pain in left toes, porokeratosis B/L  Treatment & Plan Procedures and Treatment: Consent by patient was obtained for treatment procedures. The patient understood the discussion of treatment and procedures well. All questions were answered thoroughly reviewed. Debridement of mycotic and hypertrophic toenails, 1 through 5 bilateral and clearing of subungual debris. No ulceration, no infection noted. Debridement of porokeratosis B/L Return Visit-Office Procedure: Patient instructed to return to the office for a follow up visit 3 months for continued evaluation and treatment.    Gardiner Barefoot DPM

## 2016-04-22 ENCOUNTER — Other Ambulatory Visit: Payer: Medicare Other

## 2016-04-22 ENCOUNTER — Telehealth: Payer: Self-pay | Admitting: *Deleted

## 2016-04-22 DIAGNOSIS — I10 Essential (primary) hypertension: Secondary | ICD-10-CM

## 2016-04-22 DIAGNOSIS — R739 Hyperglycemia, unspecified: Secondary | ICD-10-CM

## 2016-04-22 LAB — CBC WITH DIFFERENTIAL/PLATELET
Basophils Absolute: 0 cells/uL (ref 0–200)
Basophils Relative: 0 %
Eosinophils Absolute: 96 cells/uL (ref 15–500)
Eosinophils Relative: 2 %
HCT: 34.6 % — ABNORMAL LOW (ref 35.0–45.0)
Hemoglobin: 11.2 g/dL — ABNORMAL LOW (ref 11.7–15.5)
Lymphocytes Relative: 29 %
Lymphs Abs: 1392 cells/uL (ref 850–3900)
MCH: 31 pg (ref 27.0–33.0)
MCHC: 32.4 g/dL (ref 32.0–36.0)
MCV: 95.8 fL (ref 80.0–100.0)
MPV: 10.4 fL (ref 7.5–12.5)
Monocytes Absolute: 624 cells/uL (ref 200–950)
Monocytes Relative: 13 %
Neutro Abs: 2688 cells/uL (ref 1500–7800)
Neutrophils Relative %: 56 %
Platelets: 178 10*3/uL (ref 140–400)
RBC: 3.61 MIL/uL — ABNORMAL LOW (ref 3.80–5.10)
RDW: 15.5 % — ABNORMAL HIGH (ref 11.0–15.0)
WBC: 4.8 10*3/uL (ref 3.8–10.8)

## 2016-04-22 LAB — HEMOGLOBIN A1C
Hgb A1c MFr Bld: 5.6 % (ref <5.7)
Mean Plasma Glucose: 114 mg/dL

## 2016-04-22 MED ORDER — ALBUTEROL SULFATE HFA 108 (90 BASE) MCG/ACT IN AERS: 2.0000 | INHALATION_SPRAY | Freq: Four times a day (QID) | RESPIRATORY_TRACT | Status: DC | PRN

## 2016-04-22 NOTE — Telephone Encounter (Signed)
rx sent to pharmacy by e-script  

## 2016-04-22 NOTE — Telephone Encounter (Signed)
-----   Message -----      From: Gaynelle Adu, [facility]     Sent: 04/22/2016  2:07 PM      To: Gayland Curry, DO        Could you send a new prescription for Ventolin to her pharmacy? She is not requiring it very much. The one she has expired. One will probably last her over a year. Thanks        Dawn    ----- Message -----     From: Gayland Curry, DO     Sent: 04/22/2016  9:12 AM      To: Gaynelle Adu, [place]        Thank you! I'm glad to get confirmation that her bp cuff reads high. She is adamant it is fine and her meds are not controlling her bp, but it's always been good here. We don't have samples of albuterol.    TReed    ----- Message -----     From: Gaynelle Adu, [facility]     Sent: 04/15/2016  5:01 PM      To: Gayland Curry, DO        Please see pharmacy home visit note.         Deanne Coffer, PharmD, [place]    [phone]

## 2016-04-23 ENCOUNTER — Other Ambulatory Visit: Payer: Self-pay

## 2016-04-23 LAB — COMPLETE METABOLIC PANEL WITH GFR
ALT: 29 U/L (ref 6–29)
AST: 25 U/L (ref 10–35)
Albumin: 3.9 g/dL (ref 3.6–5.1)
Alkaline Phosphatase: 71 U/L (ref 33–130)
BUN: 21 mg/dL (ref 7–25)
CO2: 25 mmol/L (ref 20–31)
Calcium: 9.2 mg/dL (ref 8.6–10.4)
Chloride: 109 mmol/L (ref 98–110)
Creat: 1.04 mg/dL — ABNORMAL HIGH (ref 0.60–0.88)
GFR, Est African American: 55 mL/min — ABNORMAL LOW (ref >=60)
GFR, Est Non African American: 48 mL/min — ABNORMAL LOW (ref >=60)
Glucose, Bld: 92 mg/dL (ref 65–99)
Potassium: 3.9 mmol/L (ref 3.5–5.3)
Sodium: 143 mmol/L (ref 135–146)
Total Bilirubin: 1 mg/dL (ref 0.2–1.2)
Total Protein: 6.5 g/dL (ref 6.1–8.1)

## 2016-04-23 NOTE — Patient Outreach (Signed)
[name]) Care Management  Late entry for 04/12/2016  [name] [name] 12-01-1923 [id number]   Initial home visit completed.   Subjective: Patient stated that she feeling fine today. Denies any pain. Stated she is having difficulty with her blood pressure readings being too high. Stated that she has not had any symptoms of high blood pressure including headaches, dizziness, chest pain, numbness or tingling.   Patient reports history of "mini strokes." Reports that she was not aware of any stroke like symptoms at the time. Reported that she initially had some dizziness and was not feeling well, but did not immediately seek medical treatment. After her daughter encouraged her to seek treatment, found out it was a mini-stroke. Denies any other significant medical history.  Patient does not have a scale to weigh. Reported last weight at provider's office was 138.  Patient stated that she is on a low salt diet and does not use any added salt.   Objective: RNCM assessed blood pressure with patient's cuff and manually in left arm. BP with patient's cuff was 147/59 in left arm. Patient stated that she usually tests in the left arm because she is right handed and it is easier to get the cuff on her left arm. Waited approximately 5 minutes and repeated blood pressure with RNCM manual cuff. BP was 140/58 manually. Pulse 44, Respirations 16, even and unlabored. Patient monitors blood pressure daily. Takes first thing in the morning and repeats 1 hour after taking medications. Averages for past week: Systolic average before medication: 0000000  Diastolic average before medication: 123XX123 Systolic average 1 hour after medication: 123XX123  Diastolic average 1 hour after medication: 61-69 Heart rate average: 44-49 - (patient reports has been low since adjusting medications to try to bring blood pressure down)  Assessment: Patient is able to verbalize the signs and symptoms of heart attack and  stroke. Due to patient's history of mini-strokes with dizziness as only symptom, patient encouraged to seek medical care in the event she experiences dizziness.  Patient does not currently know her goal BP. Encouraged to discuss with her doctor.  Patient stated that she does not have any transportation needs. Patient's children are able to assist with transportation to appointments and assist with shopping and errands.   Plan: Provided education to patient regarding hypertension. Encouraged to continue monitoring blood pressure daily and report readings to primary care provider. Patient encouraged to discuss blood pressure goal with primary care doctor at next visit.   Educated patient regarding 24 hour nurse line and patient verbalized understanding.  Will follow up with patient in two weeks with continued education and monitoring of blood pressure.  [place] R. Anwar Sakata, RN, BSN, [place] Management Coordinator [phone]

## 2016-04-24 NOTE — Patient Outreach (Signed)
[place] [facility]) Care Management  04/23/2016  [name] [name] 11-27-1923 [id number]   Follow up home visit with patient completed.  Subjective: Patient has no complaints today. Denies any pain, shortness of breath, headaches, dizziness, numbness or tingling. Stated that she continues to monitor her blood pressure readings and they continue to be elevated.  Objective: RNCM checked patient blood pressure at visit with patient's cuff in left arm: 140/75. After waiting approximately 5 minutes, RNCM rechecked blood pressure manually in left arm: 140/76. Readings are very comparable between patient's cuff and RNCM manual readings.  Patient continues to log blood pressure readings on calendar twice daily - first thing in the morning and 1 hour after taking medication. This morning's readings: initial - 173/62; 1 hour post medication - 145/66.  Assessment: Patient continues to have high blood pressure readings first thing in the morning prior to taking medications. Patient encouraged to take readings and blood pressure cuff to her appointment this week so that her provider could assess. She verbalized understanding. Educated patient regarding hypertension via EMMI and patient receptive to information. Patient verbalized understanding of basic concepts and stated the education was a good review of everything she has learned over the past few years related to managing her blood pressure. Stated it helped to remind her of some things she had learned in the past and was a good refresher. Patient is active and performs daily exercises that had previously been prescribed by physical therapy. She maintains a low salt diet and does not use any added salt. Patient stated that she does not know what her goal blood pressure is and asked if she should have one. Encouraged patient to discuss target blood pressure goal with her provider. She continued to state that years ago, the goal was 120/60 or 70 but it  has not been addressed since her mini strokes and her difficulty in lowering her blood pressure. Patient stated that she has an appointment this week and will ask her doctor what her goal should be.  RNCM and patient discussed patient's log of blood pressure readings and that although it has not been a drastic change, it appears that blood pressure readings are trending slightly lower on average. Educated regarding goals of treatment are to stabilize blood pressure at a normal level, but it takes time to find the right solution. Encouraged to continue monitoring and providing readings to provider as that will help to determine if there are any changes that should be made to her treatment plan. Encouraged to continue with her exercises, taking her medications as prescribed and maintaining a low-salt diet. Patient verbalized understanding.  Plan: Continue to monitor blood pressure and provide hypertension education. Follow up regarding patient's appointment and goal target for blood pressure.   [place] R. Jossette Zirbel, RN, BSN, CCM [facility] Care Management Coordinator [phone]'

## 2016-04-25 ENCOUNTER — Encounter: Payer: Self-pay | Admitting: Internal Medicine

## 2016-04-25 ENCOUNTER — Ambulatory Visit (INDEPENDENT_AMBULATORY_CARE_PROVIDER_SITE_OTHER): Payer: Medicare Other | Admitting: Internal Medicine

## 2016-04-25 VITALS — BP 130/60 | HR 58 | Temp 98.1°F | Wt 138.0 lb

## 2016-04-25 DIAGNOSIS — I639 Cerebral infarction, unspecified: Secondary | ICD-10-CM | POA: Diagnosis not present

## 2016-04-25 DIAGNOSIS — K219 Gastro-esophageal reflux disease without esophagitis: Secondary | ICD-10-CM | POA: Diagnosis not present

## 2016-04-25 DIAGNOSIS — I872 Venous insufficiency (chronic) (peripheral): Secondary | ICD-10-CM

## 2016-04-25 DIAGNOSIS — I10 Essential (primary) hypertension: Secondary | ICD-10-CM

## 2016-04-25 DIAGNOSIS — K224 Dyskinesia of esophagus: Secondary | ICD-10-CM | POA: Diagnosis not present

## 2016-04-25 NOTE — Progress Notes (Signed)
Location:  [facility] clinic Provider:  Carlyne Keehan [name], D.O., C.M.D.  Code Status: full code Goals of Care:  Advanced Directives 04/25/2016  Does patient have an advance directive? No  Type of Advance Directive -  Does patient want to make changes to advanced directive? -  Copy of advanced directive(s) in chart? -   Chief Complaint  Patient presents with  . Medical Management of Chronic Issues    3 mth follow-up    HPI: Patient is a 82 y.o. female seen today for medical management of chronic diseases.    She continues to use her bp cuff which reads high.  Our readings and readings by [facility] staff and home health have always been lower.  She has been counseled on this.  We concluded that her bp is ok and she does not need to keep checking it at home.    Feeling fine.  Is drinking the ensure--one a day, some days (rarely) will have 2.  Sugar average is normal again at 5.6.    She has not had blood or darkness of her stool that she's noticed.  No abdominal pain.  Discussed repeating h/h next time as it has varied into the 11s in March also and then up to 12.6 (also lab difference could contribute).  GERD:  Sometimes protonix works fine.  Takes before breakfast.  Sometimes, it doesn't work well.  Says it may have been something hot she ate.     Past Medical History  Diagnosis Date  . Benign essential hypertension   . Osteoarthritis   . GERD (gastroesophageal reflux disease)   . Seasonal allergies   . Asthma   . Glaucoma   . PVD (peripheral vascular disease) ([place])   . Anxiety   . Stroke [facility])     Past Surgical History  Procedure Laterality Date  . Abdominal hysterectomy  1981  . Tonsillectomy  1953    Allergies  Allergen Reactions  . Aspirin     sweats  . Codeine   . Iodine     Pt is not aware of this allergy, does not recall much info....//a.c.  . Prednisone     sweats  . Sulfa Antibiotics     Doesn't remember      Medication List       This list is accurate as  of: 04/25/16  2:56 PM.  Always use your most recent med list.               acetaminophen 500 MG tablet  Commonly known as:  TYLENOL  Take 1 tablet (500 mg total) by mouth every 6 (six) hours as needed for moderate pain.     albuterol 108 (90 Base) MCG/ACT inhaler  Commonly known as:  PROVENTIL HFA;VENTOLIN HFA  Inhale 2 puffs into the lungs every 6 (six) hours as needed for wheezing.     alum hydroxide-mag trisilicate [id number] MG Chew chewable tablet  Commonly known as:  GAVISCON  Chew 1 tablet by mouth 3 (three) times daily as needed for flatulence (bowel spasms).     amLODipine 5 MG tablet  Commonly known as:  NORVASC  TAKE 1 TABLET (5 MG TOTAL) BY MOUTH DAILY.     atorvastatin 10 MG tablet  Commonly known as:  LIPITOR  TAKE 1 TABLET (10 MG TOTAL) BY MOUTH DAILY AT 6 PM.     B COMPLEX PO  Take 1 tablet by mouth daily.     bimatoprost 0.01 % Soln  Commonly known  as:  LUMIGAN  Place 1 drop into both eyes at bedtime.     brinzolamide 1 % ophthalmic suspension  Commonly known as:  AZOPT  Place 1 drop into both eyes 3 (three) times daily. Reported on 01/08/2016     CALTRATE 600+D 600-400 MG-UNIT tablet  Generic drug:  Calcium Carbonate-Vitamin D  Take 2 tablets by mouth daily.     clopidogrel 75 MG tablet  Commonly known as:  PLAVIX  Take 1 tablet (75 mg total) by mouth daily.     Coenzyme Q-10 100 MG capsule  Take 100 mg by mouth daily.     Fish Oil 1200 MG Cpdr  Take one tablet by mouth once daily     fluticasone 50 MCG/ACT nasal spray  Commonly known as:  FLONASE  Place 1 spray into the nose daily.     hydrocortisone 2.5 % rectal cream  Commonly known as:  ANUSOL-HC  Place 1 application rectally 2 (two) times daily.     loteprednol 0.5 % ophthalmic suspension  Commonly known as:  LOTEMAX  Place 1 drop into both eyes daily.     Nebivolol HCl 20 MG Tabs  Take 1 tablet (20 mg total) by mouth daily. To control blood pressure     pantoprazole 40 MG tablet    Commonly known as:  PROTONIX  Take 1 tablet (40 mg total) by mouth daily.     RESTASIS 0.05 % ophthalmic emulsion  Generic drug:  cycloSPORINE  Place 1 drop into both eyes 2 (two) times daily.     risperiDONE 0.5 MG tablet  Commonly known as:  RISPERDAL  TAKE 1 TABLET BY MOUTH TWICE A DAY     SENIOR MULTIVITAMIN PLUS Tabs  Take by mouth.     senna-docusate 8.6-50 MG tablet  Commonly known as:  Senokot-S  Take 1 tablet by mouth daily.     SYMBICORT 80-4.5 MCG/ACT inhaler  Generic drug:  budesonide-formoterol  Place 2 puffs into alternate nostrils 2 (two) times daily.     vitamin C 500 MG tablet  Commonly known as:  ASCORBIC ACID  Take 500 mg by mouth daily.     Vitamin D3 2000 units Tabs  Take 2,000 Units by mouth daily.        Review of Systems:  Review of Systems  Constitutional: Negative for fever and chills.  HENT: Negative for congestion.   Eyes: Negative for blurred vision.  Respiratory: Negative for cough and shortness of breath.   Cardiovascular: Negative for chest pain, palpitations and leg swelling.  Gastrointestinal: Positive for heartburn. Negative for abdominal pain, diarrhea, constipation, blood in stool and melena.  Genitourinary: Negative for dysuria.  Musculoskeletal: Negative for myalgias, joint pain and falls.  Skin: Negative for itching and rash.  Neurological: Negative for dizziness and loss of consciousness.  Endo/Heme/Allergies: Does not bruise/bleed easily.  Psychiatric/Behavioral: Positive for memory loss. Negative for depression.    Health Maintenance  Topic Date Due  . MAMMOGRAM  10/07/2018 (Originally 09/28/2009)  . ZOSTAVAX  10/07/2018 (Originally 10/04/1986)  . TETANUS/TDAP  10/07/2018 (Originally 10/04/1945)  . INFLUENZA VACCINE  05/07/2016  . DEXA SCAN  Completed  . PNA vac Low Risk Adult  Completed    Physical Exam: Filed Vitals:   04/25/16 1444  BP: 130/60  Pulse: 58  Temp: 98.1 F (36.7 C)  TempSrc: Oral  Weight:  138 lb (62.596 kg)  SpO2: 96%   Body mass index is 25.23 kg/(m^2). Physical Exam  Constitutional: She is oriented  to person, place, and time. She appears well-developed and well-nourished.  Cardiovascular: Normal rate, regular rhythm and intact distal pulses.   Click; using compression hose  Pulmonary/Chest: Effort normal and breath sounds normal. No respiratory distress.  Musculoskeletal: Normal range of motion.  Neurological: She is alert and oriented to person, place, and time.  Skin: Skin is warm and dry.  Psychiatric: She has a normal mood and affect.    Labs reviewed: Basic Metabolic Panel:  Recent Labs  12/09/15 1414 12/09/15 1421 04/22/16 1118  NA 142 142 143  K 3.9 3.9 3.9  CL 108 105 109  CO2 25  --  25  GLUCOSE 122* 122* 92  BUN 20 22* 21  CREATININE 0.80 0.90 1.04*  CALCIUM 9.3  --  9.2   Liver Function Tests:  Recent Labs  12/09/15 1414 04/22/16 1118  AST 22 25  ALT 18 29  ALKPHOS 63 71  BILITOT 0.9 1.0  PROT 6.6 6.5  ALBUMIN 3.7 3.9   No results for input(s): LIPASE, AMYLASE in the last 8760 hours. No results for input(s): AMMONIA in the last 8760 hours. CBC:  Recent Labs  12/09/15 1414 12/09/15 1421 04/22/16 1118  WBC 5.3  --  4.8  NEUTROABS 3.1  --  2688  HGB 11.5* 12.6 11.2*  HCT 33.7* 37.0 34.6*  MCV 90.1  --  95.8  PLT 211  --  178   Lipid Panel:  Recent Labs  12/10/15 0549  CHOL 173  HDL 86  LDLCALC 80  TRIG 34  CHOLHDL 2.0   Lab Results  Component Value Date   HGBA1C 5.6 04/22/2016     Assessment/Plan 1. Benign essential hypertension -bp at goal here at 130/60 with pulse 58 when her machine reads 152/64 with pulse 49 - advised to quit checking her bp at home b/c her machine is inaccurate   2. Gastroesophageal reflux disease without esophagitis -cont protonix before breakfast--she thinks she had some flares b/c she ate some hot foods  3. Esophageal dysmotility -did not complain about her swallowing difficulty  today and prior swallowing workup did not require intervention  4. Chronic venous insufficiency -cont compression hose  Labs/tests ordered:  No orders of the defined types were placed in this encounter.    Next appt:  05/30/2016    Jamol Ginyard L. Rayshawn Visconti, D.O. [facility] [address], [place] [address] Cell Phone (Mon-Fri 8am-5pm):  [phone] On Call:  [phone] & follow prompts after 5pm & weekends Office Phone:  [phone] Office Fax:  [phone]

## 2016-04-25 NOTE — Patient Instructions (Signed)
Please get back to drinking 6 glasses of water per day.    We will recheck your blood counts and kidneys next time.    No need to continue checking your blood pressure on your own.  Your machine reads too high.

## 2016-05-03 ENCOUNTER — Telehealth: Payer: Self-pay | Admitting: *Deleted

## 2016-05-03 MED ORDER — NYSTATIN 100000 UNIT/GM EX CREA: 1.0000 "application " | TOPICAL_CREAM | Freq: Three times a day (TID) | CUTANEOUS | 0 refills | Status: DC

## 2016-05-03 NOTE — Telephone Encounter (Signed)
Patient called and stated that the bottom of her stomach in the fold is bothering her. She stated that it started off feeling irritated. It is red, no itching, no rash. States its just red and irritated, not painful. Patient wants to know what she can put on it. Patient stated that she sweats at night and feels alittle confused.  Please advise.

## 2016-05-03 NOTE — Telephone Encounter (Signed)
I recommend she use nystatin cream to the skin fold three times a day as needed for irritation.  Dispense one tube, one refill.  If it is not getting better, she should call us back.  It may take a week or so to get better.

## 2016-05-03 NOTE — Telephone Encounter (Signed)
Patient notified and agreed. Will call back to schedule an appointment.

## 2016-05-06 ENCOUNTER — Other Ambulatory Visit: Payer: Self-pay

## 2016-05-08 ENCOUNTER — Telehealth: Payer: Self-pay | Admitting: *Deleted

## 2016-05-08 NOTE — Telephone Encounter (Signed)
Noted, thanks!

## 2016-05-08 NOTE — Telephone Encounter (Signed)
Patient called and stated that you told her to call you when she had a headache. She stated that she woke up with a bad headache but it is gone now.  No other symptoms and it is gone and she feels fine.

## 2016-05-09 NOTE — Patient Outreach (Signed)
05/06/2016  HENLI MURDY 1924-01-01 [id number]  Successful outreach completed with patient. Patient identify verified. Patient reports that she is doing ok today, but had a sick spell Saturday. Patient reported that she felt confused when she was trying to give someone her telephone number, but didn't feel like it was her number. She stated that the confusion did not last too long. Patient reports that she did call [name] and she was told that if she continued to feel like that, she could call to set up an appointment to be seen. Patient did not go into the office because the symptoms resolved and she has not had any additional problems. Encouraged to call doctor immediately if she does experience any additional symptoms and patient verbalized understanding. Patient denies any headaches, chest pain, shortness of breath, and numbness and tingling at present. Patient also stated that she was given Nystatin cream for a red area under her belly. Patient reports that it is getting better. She denies any other changes to her medications.  Patient stated that she was told at the doctor's office that she did not have to continue checking her blood pressure, but she does anyway. Reported that it was 159/64, pulse 42 today before taking her medication. Stated that when she rechecked it after taking her medication, her BP was 132/61, pulse 47. Patient reports her goal is 140/90 and after taking her medication, she is in her normal range. She stated that it looks as if it is getting better and coming down. Stated that her before and after medication blood pressures are not as high as they used to be. She has purchased a new blood pressure cuff also ([employer] brand). Patient stated that she did take her cuff with her into the doctor's office and they checked with her monitor and it is reading higher in the office than when they check it. Patient stated that she knows she does not have to keep checking it, but she  likes to see it coming down for herself.   Plan: will complete final home visit next week and plan to discharge if patient continues to be stable.  [place] R. Carzell Saldivar, RN, BSN, [place] Management Coordinator [phone]

## 2016-05-10 ENCOUNTER — Telehealth: Payer: Self-pay | Admitting: *Deleted

## 2016-05-10 NOTE — Telephone Encounter (Signed)
Patient notified and agreed.  

## 2016-05-10 NOTE — Telephone Encounter (Signed)
Patient called and wanted to let [name] know that her blood pressure is up to 195/73.  Taking prescribed medications as directed. Does not have a headache but alittle swimmy headed. Please Advise.

## 2016-05-10 NOTE — Telephone Encounter (Signed)
Her blood pressure cuff is unreliable.  No changes.

## 2016-05-13 ENCOUNTER — Telehealth: Payer: Self-pay

## 2016-05-13 DIAGNOSIS — H401133 Primary open-angle glaucoma, bilateral, severe stage: Secondary | ICD-10-CM | POA: Diagnosis not present

## 2016-05-13 DIAGNOSIS — H16223 Keratoconjunctivitis sicca, not specified as Sjogren's, bilateral: Secondary | ICD-10-CM | POA: Diagnosis not present

## 2016-05-13 NOTE — Telephone Encounter (Signed)
She should not check her blood pressure BEFORE her medication.  Only at least one hour after taking it.

## 2016-05-13 NOTE — Telephone Encounter (Signed)
.  left message to have patient return my call.  

## 2016-05-13 NOTE — Telephone Encounter (Signed)
Patient called c/o elevated B/P this weekend (ongoing concern) and dizziness. Patient is taking B/P medications as prescribed.   Saturday B/P 205/78, 42 pulse. 1 hours after taking B/P medication on Sat B/P: 159/72,42 pulse.  Sunday B/P 202/76, 49 pulse. 1 hour after taking B/P medication on Sun 151/70, 47 pulse.  Today B/P 172/72, 46 pulse. 1 hour after taking medication B/P 151/63, 43.  Patient switched B/P cuffs. Please advise

## 2016-05-14 ENCOUNTER — Ambulatory Visit (INDEPENDENT_AMBULATORY_CARE_PROVIDER_SITE_OTHER): Payer: Medicare Other | Admitting: Neurology

## 2016-05-14 ENCOUNTER — Encounter: Payer: Self-pay | Admitting: Neurology

## 2016-05-14 VITALS — BP 155/67 | Wt 137.4 lb

## 2016-05-14 DIAGNOSIS — I639 Cerebral infarction, unspecified: Secondary | ICD-10-CM

## 2016-05-14 DIAGNOSIS — E785 Hyperlipidemia, unspecified: Secondary | ICD-10-CM

## 2016-05-14 DIAGNOSIS — I10 Essential (primary) hypertension: Secondary | ICD-10-CM | POA: Diagnosis not present

## 2016-05-14 NOTE — Progress Notes (Signed)
STROKE NEUROLOGY FOLLOW UP NOTE  NAME: [name] [name] DOB: June 09, 1924  REASON FOR VISIT: stroke follow up HISTORY FROM: chart and pt  Today we had the pleasure of seeing [name] [name] in follow-up at our Neurology Clinic. Pt was accompanied by daughter.   History Summary [name] [name] is a 82 y.o. female with history of HTN, PVD, asthma, anxiety admitted on 12/09/15 for vertigo. Symptoms resolved quickly. She did not receive IV t-PA due to resolution of deficits. However, MRI showed left cerebellar cortex and left temporoparietal punctate infarcts, embolic pattern. MRA head and neck unremarkable. TTE unremarkable, LDL 80 and A1C 5.7. She was put on plavix and low dose lipitor on discharge.   02/06/16 follow up - the patient has been doing well. No recurrent stroke like symptoms. However, her BP not in good control. Worked with PCP and currently on amlodipine 5mg  (she can not tolerate with 10mg ) and bystolic. Today BP 162/72. No recurrent vertigo. Walk with cane and steady.     Interval History During the interval time, the pt has been doing well. BP better controlled than last time but still not at goal. Today BP 153/67. Patient stated that her BP at home fluctuate, seems in the normal range 3-4 hours after medication. [name] is following up with her BP management. Had 30 day cardiac event monitoring which did not show A. Fib. Other than that, patient no complaints.  REVIEW OF SYSTEMS: Full 14 system review of systems performed and notable only for those listed below and in HPI above, all others are negative:  Constitutional:  Excessive sweating Cardiovascular:  Ear/Nose/Throat:   Skin:  Eyes:  Light sensitivity, eye itching, eye redness, eye pain, blurry vision Respiratory:   Gastroitestinal:   Genitourinary:  Hematology/Lymphatic:   Endocrine:  Musculoskeletal:   Allergy/Immunology: Env allergy   Neurological:   Psychiatric: decreased concentration Sleep: restless leg,  insomnia  The following represents the patient's updated allergies and side effects list: Allergies  Allergen Reactions  . Aspirin     sweats  . Codeine   . Iodine     Pt is not aware of this allergy, does not recall much info....//a.c.  . Prednisone     sweats  . Sulfa Antibiotics     Doesn't remember    The neurologically relevant items on the patient's problem list were reviewed on today's visit.  Neurologic Examination  A problem focused neurological exam (12 or more points of the single system neurologic examination, vital signs counts as 1 point, cranial nerves count for 8 points) was performed.  Blood pressure (!) 155/67, weight 137 lb 6.4 oz (62.3 kg).  General - Well nourished, well developed, in no apparent distress.  Ophthalmologic - Fundi not visualized due to light sensitivity.  Cardiovascular - Regular rate and rhythm.  Mental Status -  Level of arousal and orientation to time, place, and person were intact. Language including expression, naming, repetition, comprehension was assessed and found intact. Fund of Knowledge was assessed and was intact.  Cranial Nerves II - XII - II - Visual field intact OU. III, IV, VI - Extraocular movements intact. V - Facial sensation intact bilaterally. VII - Facial movement intact bilaterally. VIII - Hearing & vestibular intact bilaterally. X - Palate elevates symmetrically. XI - Chin turning & shoulder shrug intact bilaterally. XII - Tongue protrusion intact.  Motor Strength - The patient's strength was normal in all extremities and pronator drift was absent.  Bulk was normal and  fasciculations were absent.   Motor Tone - Muscle tone was assessed at the neck and appendages and was normal  Reflexes - The patient's reflexes were 1+ in all extremities and she had no pathological reflexes.  Sensory - Light touch, temperature/pinprick were assessed and were normal.    Coordination - The patient had normal movements in  the hands and feet with no ataxia or dysmetria.  Tremor was absent.  Gait and Station - walk with cane, slow and small stride, steady no fall.    Data reviewed: I personally reviewed the images and agree with the radiology interpretations.   Ct Head Wo Contrast 12/09/2015  Atrophy with small vessel chronic ischemic changes of deep cerebral white matter. No acute intracranial abnormalities.  MRA HEAD  12/10/2015  Negative intracranial MRA of the brain. No large or proximal arterial branch occlusion. No high-grade or correctable stenosis.   MRA NECK  12/10/2015  1. No flow limiting stenosis or other acute abnormality within the major arterial vasculature of the neck.  2. Tortuosity of the arterial vasculature of the neck, suggesting chronic underlying hypertension.   Mr Brain Wo Contrast 12/09/2015  Punctate acute infarction in the peripheral left cerebellum and in the peripheral left posterior temporal region, consistent with micro embolic infarctions in the posterior circulation. No large infarction, swelling or hemorrhage. Chronic small vessel ischemic changes elsewhere throughout the brain, similar to the study of 2015.   2D Echocardiogram  - Left ventricle: The cavity size was normal. Wall thickness wasnormal. Systolic function was normal. The estimated ejectionfraction was in the range of 60% to 65%. Wall motion was normal;there were no regional wall motion abnormalities. - Pulmonic valve: There was moderate regurgitation. - Pulmonary arteries: Systolic pressure was mildly increased. PApeak pressure: 32 mm Hg (S). Impressions: No cardiac source of emboli was indentified. Compared to theprior study, there has been no significant interval change.  30 day cardiac event monitoring - no A. fib  Component     Latest Ref Rng 07/17/2015 12/10/2015  Cholesterol     0 - 200 mg/dL  173  Triglycerides     <150 mg/dL  34  HDL Cholesterol     >40 mg/dL  86  Total CHOL/HDL  Ratio       2.0  VLDL     0 - 40 mg/dL  7  LDL (calc)     0 - 99 mg/dL  80  Hemoglobin A1C     4.8 - 5.6 % 5.5 5.7 (H)  Est. average glucose Bld gHb Est-mCnc      111   Mean Plasma Glucose       117    Assessment: As you may recall, she is a 82 y.o. African American female with PMH of HTN, PVD, asthma, anxiety admitted on 12/09/15 for vertigo. Symptoms resolved quickly. MRI showed left cerebellar cortex and left temporoparietal punctate infarcts, embolic pattern. MRA head and neck unremarkable. TTE unremarkable, LDL 80 and A1C 5.7. She was put on plavix and low dose lipitor on discharge. During the interval time, the patient has been doing well. No recurrent stroke like symptoms. However, her BP not in good control, currently working with PCP for that. Had a 30 day cardiac event monitoring which did not show A. fib  Plan:  - continue plavix and lipitor for stroke prevention for now - check BP at home twice or three times a day and record and bring over to [name] for medication adjustment if needed -  Follow up with your primary care physician for stroke risk factor modification. Recommend maintain blood pressure goal 130/80, diabetes with hemoglobin A1c goal below 6.5% and lipids with LDL cholesterol goal below 70 mg/dL.  - healthy diet and regular exercise - may consider calibration of BP device at pharmacy.  - follow up in 6 months.  I spent more than 25 minutes of face to face time with the patient. Greater than 50% of time was spent in counseling and coordination of care. We discussed about follow-up with [name], and BP management with PCP.  No orders of the defined types were placed in this encounter.   No orders of the defined types were placed in this encounter.   Patient Instructions  - continue plavix and lipitor for stroke prevention for now - check BP at home twice or three times a day and record and bring over to [name] for medication adjustment if needed - Follow up  with your primary care physician for stroke risk factor modification. Recommend maintain blood pressure goal 130/80, diabetes with hemoglobin A1c goal below 6.5% and lipids with LDL cholesterol goal below 70 mg/dL.  - healthy diet and regular exercise - may consider calibration of BP device at pharmacy.  - follow up in 6 months.   Rosalin Hawking, MD PhD [facility] [address], [place], [place] [address] [phone]

## 2016-05-14 NOTE — Telephone Encounter (Signed)
Patient notified and agree.

## 2016-05-14 NOTE — Patient Instructions (Signed)
-   continue plavix and lipitor for stroke prevention for now - check BP at home twice or three times a day and record and bring over to [name] for medication adjustment if needed - Follow up with your primary care physician for stroke risk factor modification. Recommend maintain blood pressure goal 130/80, diabetes with hemoglobin A1c goal below 6.5% and lipids with LDL cholesterol goal below 70 mg/dL.  - healthy diet and regular exercise - may consider calibration of BP device at pharmacy.  - follow up in 6 months.

## 2016-05-16 ENCOUNTER — Other Ambulatory Visit: Payer: Self-pay

## 2016-05-21 NOTE — Patient Outreach (Signed)
[facility]) Care Management  Late entry for 05/16/2016  JERICHO CADDICK [birth year] [id number]    Follow up home visit completed with patient. Patient is alert and oriented x 3. Stated that she feels pretty good today. Denies any pain, headaches or dizziness at present.  BP 144/56 manual left arm sitting Pulse 44 SpO2 98%  Patient reports that she began having problems last week. Stated that her head felt funny. She reports that she wasn't dizzy like when she had her stroke, but that it "felt heavy or something and just didn't feel good." She stated that this is the way she normally feels when her pressure is high. She stated this occurred on Friday 8/4 and that it stayed like that on Friday and began to feel a little better each day. She stated that she was feeling better on Monday, but her pressure was still high. She stated that she contacted her PCP on Monday and has had several conversations with them. Stated that they have told her to no longer check her blood pressure before taking her medication and to only take it 1 hour after taking her blood pressure medication. Patient also had a follow up appointment with her neurologist, Dr. Erlinda Hong on 8/8. She stated that she was told that she may want to get her blood pressure monitor calibrated with a pharmacy. Patient sent her newest monitor (from [place]) home with her daughter to [place] to see if they could get it calibrated. This was the monitor that matched closely to [facility] blood pressure checks. Since 05/07/2016, patient has been using an Om[employer] monitor that she has had for a few years.   RNCM manually checked BP today: 128/72. After waiting 10 minutes, rechecked patient's blood pressure with her digital monitor: 129/61.  History of readings since 05/07/2016 (readings taken 1 hour after taking her blood pressure medication): 8/1: BP 160/64  P 45  8/2: BP 155/68 P 45 8/3: BP 167/66 P 45 8/4: BP 185/74 P 45 8/5: BP 159/72 P 42 8/6: BP  151/70 P 47 8/7: BP 151/63 P 43 8/8: BP 123/51 P 49 8/9: BP 134/68 P 44 8/10: BP 165/67  P 42  Patient reports that she feels worse in the evenings and wonders if her blood pressure is going up at night. Advised patient that Dr. Erlinda Hong recommended checking her blood pressure 2-3 times per day. Encouraged to continue checking every morning after taking her medication and taking at bedtime. Patient agreeable. Advised to document morning and evening readings for next couple of weeks and will review again and send to PCP. Both of patient's blood pressure monitors have been comparable to RNCM's manual readings, so should get a good picture of what is going on in the evenings.   Plan: RNCM will follow up with patient in a couple of weeks for morning and evening readings Patient to call 911 with any signs and symptoms of stroke or heart attack.  [place] R. Geri Hepler, RN, BSN, [place] Management Coordinator [phone]

## 2016-05-28 ENCOUNTER — Encounter: Payer: Self-pay | Admitting: Cardiology

## 2016-05-29 ENCOUNTER — Other Ambulatory Visit: Payer: Self-pay

## 2016-05-29 NOTE — Patient Outreach (Signed)
[facility]) Care Management  05/29/16  [name] [name] [birth year] [id number]  Received voicemail from patient at 10:34 am requesting callback.  Returned call to patient and successful outreach completed. Patient identification verified.  Patient stated that she was wondering if RNCM was going to come out for an additional home visit. Stated that she has been checking her blood pressures 3 times per day with the Omron cuff. Patient reports that her readings have been about the same and has not been as high as it was when she was feeling so bad. She reports that she has not had any symptoms of hypertension and no more dizzy episodes. Patient currently denies any pain.   Discussed the evening blood pressure readings with the patient. Per patient the evening readings are higher than in the morning after taking her medications. Patient also reported that her daughter will be bringing her [employer] (the newer blood pressure monitor) back to her today. Advised to continue taking blood pressure 3 times a day for the next week with the [employer] monitor and next week we will compare the two monitors against RNCM's manual reading. Patient verbalized understanding.   Patient currently does not have any additional questions or concerns. Home visit scheduled next week to review readings for the past few weeks. Patient encouraged to call with any additional questions or concerns.   [place] R. Rayla Pember, RN, BSN, [place] Management Coordinator [phone]

## 2016-05-30 ENCOUNTER — Ambulatory Visit: Payer: Medicare Other | Admitting: Internal Medicine

## 2016-06-05 ENCOUNTER — Other Ambulatory Visit: Payer: Self-pay

## 2016-06-11 ENCOUNTER — Telehealth: Payer: Self-pay | Admitting: *Deleted

## 2016-06-11 ENCOUNTER — Ambulatory Visit: Payer: Medicare Other | Admitting: Cardiology

## 2016-06-11 ENCOUNTER — Encounter: Payer: Self-pay | Admitting: Cardiology

## 2016-06-11 ENCOUNTER — Ambulatory Visit (INDEPENDENT_AMBULATORY_CARE_PROVIDER_SITE_OTHER): Payer: Medicare Other | Admitting: Cardiology

## 2016-06-11 VITALS — BP 134/82 | HR 45 | Ht 62.0 in | Wt 137.4 lb

## 2016-06-11 DIAGNOSIS — I639 Cerebral infarction, unspecified: Secondary | ICD-10-CM

## 2016-06-11 DIAGNOSIS — I10 Essential (primary) hypertension: Secondary | ICD-10-CM | POA: Diagnosis not present

## 2016-06-11 DIAGNOSIS — R600 Localized edema: Secondary | ICD-10-CM

## 2016-06-11 DIAGNOSIS — R609 Edema, unspecified: Secondary | ICD-10-CM | POA: Diagnosis not present

## 2016-06-11 DIAGNOSIS — K219 Gastro-esophageal reflux disease without esophagitis: Secondary | ICD-10-CM

## 2016-06-11 DIAGNOSIS — T50905A Adverse effect of unspecified drugs, medicaments and biological substances, initial encounter: Secondary | ICD-10-CM

## 2016-06-11 DIAGNOSIS — R001 Bradycardia, unspecified: Secondary | ICD-10-CM

## 2016-06-11 DIAGNOSIS — R0602 Shortness of breath: Secondary | ICD-10-CM

## 2016-06-11 HISTORY — DX: Adverse effect of unspecified drugs, medicaments and biological substances, initial encounter: T50.905A

## 2016-06-11 HISTORY — DX: Bradycardia, unspecified: R00.1

## 2016-06-11 MED ORDER — LISINOPRIL 10 MG PO TABS
10.0000 mg | ORAL_TABLET | Freq: Every day | ORAL | 11 refills | Status: DC
Start: 2016-06-11 — End: 2016-06-21

## 2016-06-11 MED ORDER — NEBIVOLOL HCL 5 MG PO TABS: 5.0000 mg | ORAL_TABLET | Freq: Every day | ORAL | 11 refills | Status: DC

## 2016-06-11 NOTE — Patient Outreach (Signed)
[facility]) Care Management  [place]  06/11/2016   [name] [name] 1924/02/18 [id number]   Late entry for 06/05/2016  Subjective: Home visit completed with patient. Patient is alert and oriented. Currently has no complaints. Denies pain, headaches, shortness of breath, and weakness.  Objective: Vital signs listed below in assessment.  Encounter Medications:  Outpatient Encounter Prescriptions as of 06/05/2016  Medication Sig  . acetaminophen (TYLENOL) 500 MG tablet Take 1 tablet (500 mg total) by mouth every 6 (six) hours as needed for moderate pain.  Marland Kitchen albuterol (PROVENTIL HFA;VENTOLIN HFA) 108 (90 Base) MCG/ACT inhaler Inhale 2 puffs into the lungs every 6 (six) hours as needed for wheezing.  Marland Kitchen alum hydroxide-mag trisilicate (GAVISCON) [id number] MG CHEW chewable tablet Chew 1 tablet by mouth 3 (three) times daily as needed for flatulence (bowel spasms).  Marland Kitchen amLODipine (NORVASC) 5 MG tablet TAKE 1 TABLET (5 MG TOTAL) BY MOUTH DAILY.  Marland Kitchen atorvastatin (LIPITOR) 10 MG tablet TAKE 1 TABLET (10 MG TOTAL) BY MOUTH DAILY AT 6 PM.  . B Complex Vitamins (B COMPLEX PO) Take 1 tablet by mouth daily.  . bimatoprost (LUMIGAN) 0.01 % SOLN Place 1 drop into both eyes at bedtime.  . brinzolamide (AZOPT) 1 % ophthalmic suspension Place 1 drop into both eyes 3 (three) times daily. Reported on 01/08/2016  . Calcium Carbonate-Vitamin D (CALTRATE 600+D) 600-400 MG-UNIT per tablet Take 2 tablets by mouth daily.  . Cholecalciferol (VITAMIN D3) 2000 units TABS Take 2,000 Units by mouth daily.  . clopidogrel (PLAVIX) 75 MG tablet Take 1 tablet (75 mg total) by mouth daily.  . Coenzyme Q-10 100 MG capsule Take 100 mg by mouth daily.  . fluticasone (FLONASE) 50 MCG/ACT nasal spray Place 1 spray into the nose daily.  . hydrocortisone (ANUSOL-HC) 2.5 % rectal cream Place 1 application rectally 2 (two) times daily.  Marland Kitchen loteprednol (LOTEMAX) 0.5 % ophthalmic suspension Place 1 drop into both eyes  daily.   . Multiple Vitamins-Minerals (SENIOR MULTIVITAMIN PLUS) TABS Take by mouth.  . nystatin cream (MYCOSTATIN) Apply 1 application topically 3 (three) times daily.  . Omega-3 Fatty Acids (FISH OIL) 1200 MG CPDR Take one tablet by mouth once daily  . pantoprazole (PROTONIX) 40 MG tablet Take 1 tablet (40 mg total) by mouth daily.  . RESTASIS 0.05 % ophthalmic emulsion Place 1 drop into both eyes 2 (two) times daily.   . risperiDONE (RISPERDAL) 0.5 MG tablet TAKE 1 TABLET BY MOUTH TWICE A DAY  . senna-docusate (SENOKOT-S) 8.6-50 MG tablet Take 1 tablet by mouth daily.  . SYMBICORT 80-4.5 MCG/ACT inhaler Place 2 puffs into alternate nostrils 2 (two) times daily.  . vitamin C (ASCORBIC ACID) 500 MG tablet Take 500 mg by mouth daily.   No facility-administered encounter medications on file as of 06/05/2016.     Functional Status:  In your present state of health, do you have any difficulty performing the following activities: 04/23/2016 04/15/2016  Hearing? - N  Vision? - Y  Difficulty concentrating or making decisions? - Y  Walking or climbing stairs? (No Data) Y  Dressing or bathing? - N  Doing errands, shopping? (No Data) Y  Conservation officer, nature and eating ? - N  Using the Toilet? - N  In the past six months, have you accidently leaked urine? - N  Do you have problems with loss of bowel control? - N  Managing your Medications? - N  Managing your Finances? - N  Housekeeping or managing your  Housekeeping? - N  Some recent data might be hidden    Fall/Depression Screening: PHQ 2/9 Scores 04/25/2016 04/12/2016 02/26/2016 01/08/2016 12/14/2015 12/14/2015 10/19/2014  PHQ - 2 Score 0 0 0 0 0 0 0    Assessment: Patient has continued to take her blood pressures daily. Per her neurology directions, she should check 3 time daily.  Patient was using her [employer] blood pressure cuff from 8/16 - 05/29/2016. Readings include:  Mornings Evenings 8/16:      176/70  169/67 8/17:      190/64  170/68 8/18:   189/67  170/71 8/19:  175/69  169/70 8/20:   174/71  157/65 8/21:   176/68  153/65 8/22:  175/71  172/70 8/23:   180/65  140/60 Patient began using her new [employer] blood pressure cuff on 8/24 and is still using this cuff. Readings include:  8/24:  160/65  139/58 8/25:  149/72     8/26:  181/66  151/77 8/27:  167/70  133/53 8/28:  168/67  150/64 8/29:  169/66  142/58 8/30: 168/75 8/30 during RNCM visit: manual BP via RN 152/60 left arm, sitting      BP with [employer] monitor 148/64 left arm, sitting Patient does report that she has been having some ongoing pain in her left lower side that sometimes rotates to across the abdomen. Reports that it occurs after she eats. Patient states that the pain is not that bad when it happens, rates around 4-5/10, stating it mostly just annoys her. Patient denies any nausea, vomiting, or diarrhea with the pain and stated that the pain resolves on its own. No other complaints or concerns noted.   Plan: RNCM will send updated blood pressure readings to PCP for review as they continue to be high. RNCM's manual blood pressure readings have been comparable readings of patient's monitors x 3 home visits.   RNCM will follow up with patient in 2 weeks.  [name], RN, BSN, [place] Management Coordinator [phone]

## 2016-06-11 NOTE — Telephone Encounter (Signed)
Patient called and stated that the Pantoprazole is too expensive, insurance will not pay for it anymore $98.00/30 day supply. Would like something else. Please Advise.

## 2016-06-11 NOTE — Patient Instructions (Addendum)
Medication Instructions:  1) DECREASE the Bystolic to 5mg  daily. 2) START taking Lisinopril 10mg  daily.  Labwork: In 1 week at time of the Hypertension clinic visit.  Testing/Procedures: None  Follow-Up: Your physician wants you to follow-up in: 6 months with Dr. Radford Pax. You will receive a reminder letter in the mail two months in advance. If you don't receive a letter, please call our office to schedule the follow-up appointment.   Your physician recommends that you schedule a follow-up appointment in: 1 week (or closest appointment) with the Hypertension Clinic.   Any Other Special Instructions Will Be Listed Below (If Applicable).     If you need a refill on your cardiac medications before your next appointment, please call your pharmacy.

## 2016-06-11 NOTE — Progress Notes (Signed)
Cardiology Office Note    Date:  06/11/2016   ID:  [place], Xing 1924/02/06, MRN [id number]  PCP:  Hollace Kinnier, DO  Cardiologist:  Fransico Him, MD   Chief Complaint  Patient presents with  . Hypertension  . Shortness of Breath  . Edema    History of Present Illness:  [name] [name] is a 82 y.o. female with a history of HTN and asthma who presents today for followup of LE edema and SOB.  She is doing well today. She denies any chest pain but occasionally has some pressure in her chest when sitting in the evening and when she goes to bed.  She has no chest discomfort when she active.  She denies any SOB, DOE,dizziness, palpitations or syncope. She occasionally has some LE edema but it is fairly well controlled.      Past Medical History:  Diagnosis Date  . Anxiety   . Asthma   . Benign essential hypertension   . Bradycardia, drug induced 06/11/2016  . Edema extremities   . GERD (gastroesophageal reflux disease)   . Glaucoma   . Osteoarthritis   . PVD (peripheral vascular disease) ([place])   . Seasonal allergies   . Stroke [facility])     Past Surgical History:  Procedure Laterality Date  . ABDOMINAL HYSTERECTOMY  1981  . TONSILLECTOMY  1953    Current Medications: Outpatient Medications Prior to Visit  Medication Sig Dispense Refill  . acetaminophen (TYLENOL) 500 MG tablet Take 1 tablet (500 mg total) by mouth every 6 (six) hours as needed for moderate pain. 30 tablet 0  . albuterol (PROVENTIL HFA;VENTOLIN HFA) 108 (90 Base) MCG/ACT inhaler Inhale 2 puffs into the lungs every 6 (six) hours as needed for wheezing. 1 Inhaler 5  . alum hydroxide-mag trisilicate (GAVISCON) [id number] MG CHEW chewable tablet Chew 1 tablet by mouth 3 (three) times daily as needed for flatulence (bowel spasms). 1 each 0  . amLODipine (NORVASC) 5 MG tablet TAKE 1 TABLET (5 MG TOTAL) BY MOUTH DAILY. 30 tablet 5  . atorvastatin (LIPITOR) 10 MG tablet TAKE 1 TABLET (10 MG TOTAL) BY MOUTH DAILY AT 6  PM. 30 tablet 3  . B Complex Vitamins (B COMPLEX PO) Take 1 tablet by mouth daily.    . bimatoprost (LUMIGAN) 0.01 % SOLN Place 1 drop into both eyes at bedtime.    . brinzolamide (AZOPT) 1 % ophthalmic suspension Place 1 drop into both eyes 3 (three) times daily. Reported on 01/08/2016    . Calcium Carbonate-Vitamin D (CALTRATE 600+D) 600-400 MG-UNIT per tablet Take 2 tablets by mouth daily.    . Cholecalciferol (VITAMIN D3) 2000 units TABS Take 2,000 Units by mouth daily.    . clopidogrel (PLAVIX) 75 MG tablet Take 1 tablet (75 mg total) by mouth daily. 90 tablet 1  . Coenzyme Q-10 100 MG capsule Take 100 mg by mouth daily.    . fluticasone (FLONASE) 50 MCG/ACT nasal spray Place 1 spray into the nose daily.    . hydrocortisone (ANUSOL-HC) 2.5 % rectal cream Place 1 application rectally 2 (two) times daily. 30 g 3  . loteprednol (LOTEMAX) 0.5 % ophthalmic suspension Place 1 drop into both eyes daily.     . Multiple Vitamins-Minerals (SENIOR MULTIVITAMIN PLUS) TABS Take by mouth.    . Nebivolol HCl 20 MG TABS Take 1 tablet (20 mg total) by mouth daily. To control blood pressure 30 tablet 3  . nystatin cream (MYCOSTATIN) Apply 1 application topically  3 (three) times daily. 30 g 0  . Omega-3 Fatty Acids (FISH OIL) 1200 MG CPDR Take one tablet by mouth once daily    . pantoprazole (PROTONIX) 40 MG tablet Take 1 tablet (40 mg total) by mouth daily. 30 tablet 3  . RESTASIS 0.05 % ophthalmic emulsion Place 1 drop into both eyes 2 (two) times daily.     . risperiDONE (RISPERDAL) 0.5 MG tablet TAKE 1 TABLET BY MOUTH TWICE A DAY 60 tablet 5  . senna-docusate (SENOKOT-S) 8.6-50 MG tablet Take 1 tablet by mouth daily. 30 tablet 3  . SYMBICORT 80-4.5 MCG/ACT inhaler Place 2 puffs into alternate nostrils 2 (two) times daily.    . vitamin C (ASCORBIC ACID) 500 MG tablet Take 500 mg by mouth daily.     No facility-administered medications prior to visit.      Allergies:   Aspirin; Codeine; Iodine;  Prednisone; and Sulfa antibiotics   Social History   Social History  . Marital status: Married    Spouse name: N/A  . Number of children: N/A  . Years of education: N/A   Social History Main Topics  . Smoking status: Never Smoker  . Smokeless tobacco: Never Used  . Alcohol use No  . Drug use: No  . Sexual activity: Not Asked   Other Topics Concern  . 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   ROS:   Please see the history of present illness.    ROS All other systems reviewed and are negative.   PHYSICAL EXAM:   VS:  BP 134/82   Pulse (!) 45   Ht 5\' 2"  (1.575 m)   Wt 137 lb 6.4 oz (62.3 kg)   SpO2 98%   BMI 25.13 kg/m    GEN: Well nourished, well developed, in no acute distress  HEENT: normal  Neck: no JVD, carotid bruits, or masses Cardiac: RRR; no murmurs, rubs, or gallops,no edema.  Intact distal pulses bilaterally.  Respiratory:  clear to auscultation bilaterally, normal work of breathing GI: soft, nontender, nondistended, + BS MS: no deformity or atrophy  Skin: warm and dry, no rash Neuro:  Alert and Oriented x 3, Strength and sensation are intact Psych: euthymic mood, full affect  Wt Readings from Last 3 Encounters:  06/11/16 137 lb 6.4 oz (62.3 kg)  05/14/16 137 lb 6.4 oz (62.3 kg)  04/25/16 138 lb (62.6 kg)      Studies/Labs Reviewed:   EKG:  EKG is  ordered today and shwos sinus bradycardia at 46bpm with 1st degree AV block and RBBB with LAE  Recent Labs: 04/22/2016: ALT 29; BUN 21; Creat 1.04; Hemoglobin 11.2; Platelets 178; Potassium 3.9; Sodium 143   Lipid Panel    Component Value Date/Time   CHOL 173 12/10/2015 0549   CHOL 187 08/17/2013 0836   TRIG 34 12/10/2015 0549   HDL 86 12/10/2015  0549   HDL 105 08/17/2013 0836   CHOLHDL 2.0 12/10/2015 0549   VLDL 7 12/10/2015 0549   LDLCALC 80 12/10/2015 0549   LDLCALC 73 08/17/2013 0836    Additional studies/ records that were reviewed today include:  none    ASSESSMENT:    1. Edema extremities   2. SOB (  shortness of breath)   3. Benign essential hypertension      PLAN:  In order of problems listed above:  1. LE edema - this is intermittent and well controlled.  2. SOB - resolved. 3. HTN - BP controlled on current meds. She says that she has not tolerated higher doses of amlodipine in the past so I will start Lisinopril 10mg  daily as we are decreasing her BB due to bradycardia.  She will continue on Amlodipine 5mg  daily.  BMET check in1 week. 4. Atypical CP that is nonexertional and usually occurs in bed.  ? GERD.  Given her age and atypical symptoms will not pursue further testing as there is no exertional component or associated symptoms.  She is on ASA/statin and BB. 5. Drug induced bradycardia - decrease Bystolic to 5mg  daily.  Followup in HTN clinic in 1 week to assess HR and BP    Medication Adjustments/Labs and Tests Ordered: Current medicines are reviewed at length with the patient today.  Concerns regarding medicines are outlined above.  Medication changes, Labs and Tests ordered today are listed in the Patient Instructions below.  Patient Instructions  Medication Instructions:  Your physician recommends that you continue on your current medications as directed. Please refer to the Current Medication list given to you today.   Labwork: None  Testing/Procedures: None  Follow-Up: Your physician wants you to follow-up in: 1 year with Dr. Radford Pax. You will receive a reminder letter in the mail two months in advance. If you don't receive a letter, please call our office to schedule the follow-up appointment.   Any Other Special Instructions Will Be Listed Below (If Applicable).     If you need a refill  on your cardiac medications before your next appointment, please call your pharmacy.      Signed, Fransico Him, MD  06/11/2016 3:24 PM    [facility] [address], [place], [place]  [address] Phone: [phone]; Fax: [phone]

## 2016-06-12 MED ORDER — PANTOPRAZOLE SODIUM 40 MG PO TBEC: 40.0000 mg | DELAYED_RELEASE_TABLET | Freq: Every day | ORAL | 3 refills | Status: DC

## 2016-06-12 NOTE — Telephone Encounter (Signed)
Call the pharmacy and find out if the patient is in the donut hole.  She cannot take any other PPIs b/c of her plavix.  They are ALL contraindicated.

## 2016-06-12 NOTE — Telephone Encounter (Signed)
Doristine Devoid, thanks, Rodena Piety!

## 2016-06-12 NOTE — Telephone Encounter (Signed)
Called and spoke with pharmacist at [facility] and she stated that the insurance will only cover #90 for a year. She gave me the # to call them [phone] and member ID [id number]. I called and spoke with Wilfred Lacy and medication was APPROVED 04/13/16-06/12/17. Pharmacy and patient notified.

## 2016-06-21 ENCOUNTER — Other Ambulatory Visit: Payer: Self-pay | Admitting: Internal Medicine

## 2016-06-21 ENCOUNTER — Telehealth: Payer: Self-pay | Admitting: Cardiology

## 2016-06-21 NOTE — Telephone Encounter (Signed)
Returned call to patient Dr.Turner advised to decrease Lisinopril to 5 mg daily.Stated she has appointment with pharmacist HTN clinic Wed 06/26/16 at 11:30 am.Advised to check B/P daily and bring readings to appointment.

## 2016-06-21 NOTE — Telephone Encounter (Signed)
New message    Pt called stated that the new BP med is making her dizzy and everyday following she becomes more dizzy. She has been taking it since 9/05. It also gives her a slight headache. Please call.

## 2016-06-21 NOTE — Telephone Encounter (Signed)
Decrease Lisinopril to 5mg  daily and check BP daily for a week and call with results

## 2016-06-21 NOTE — Telephone Encounter (Signed)
Calling stating since she started taking the Lisinopril 10 mg on 9/5 she has been dizzy soon after taking medication and then gets worse a couple of hours later.  BP this AM was 136/72 HR 53; then later morning was 124/64 HR 52. Advised will forward to Dr. Radford Pax for her recommendations.

## 2016-06-23 ENCOUNTER — Other Ambulatory Visit: Payer: Self-pay | Admitting: Internal Medicine

## 2016-06-26 ENCOUNTER — Ambulatory Visit (INDEPENDENT_AMBULATORY_CARE_PROVIDER_SITE_OTHER): Payer: Medicare Other | Admitting: Pharmacist

## 2016-06-26 ENCOUNTER — Other Ambulatory Visit: Payer: Medicare Other | Admitting: *Deleted

## 2016-06-26 VITALS — BP 132/62 | HR 53

## 2016-06-26 DIAGNOSIS — I10 Essential (primary) hypertension: Secondary | ICD-10-CM

## 2016-06-26 DIAGNOSIS — R609 Edema, unspecified: Secondary | ICD-10-CM | POA: Diagnosis not present

## 2016-06-26 DIAGNOSIS — R0602 Shortness of breath: Secondary | ICD-10-CM

## 2016-06-26 DIAGNOSIS — I639 Cerebral infarction, unspecified: Secondary | ICD-10-CM

## 2016-06-26 DIAGNOSIS — R600 Localized edema: Secondary | ICD-10-CM

## 2016-06-26 LAB — BASIC METABOLIC PANEL
BUN: 22 mg/dL (ref 7–25)
CALCIUM: 9.6 mg/dL (ref 8.6–10.4)
CHLORIDE: 110 mmol/L (ref 98–110)
CO2: 24 mmol/L (ref 20–31)
Creat: 0.85 mg/dL (ref 0.60–0.88)
GLUCOSE: 115 mg/dL — AB (ref 65–99)
POTASSIUM: 3.7 mmol/L (ref 3.5–5.3)
SODIUM: 142 mmol/L (ref 135–146)

## 2016-06-26 NOTE — Patient Instructions (Signed)
Continue taking your amlodipine 5mg  and nebivolol (Bystolic) 5mg  once a day.  Take your amlodipine in the morning and take the nebivolol in the evening.  Stop taking your lisinopril.  Record your blood pressure readings at home.  Bring these readings and your blood pressure cuff into clinic in 2 weeks.

## 2016-06-26 NOTE — Progress Notes (Signed)
Patient ID: RENIYAH [name]                 DOB: 1924/08/07                      MRN: [id number]     HPI: [name] [name] is a very pleasant 82 y.o. female referred by Dr. Radford Pax to HTN clinic who presents today with her daughter. PMH is significant for HTN, asthma, occasional LE edema, GERD, and stroke. Pt was seen in the office 2 weeks ago; lisinopril 10mg  daily was initiated and nebivolol dose was decreased to 5mg  daily due to bradycardia. Pt called 10 days later with complaints of dizziness, home SBP readings ranged 120-130s. Pt was advised to reduce lisinopril dose ot 5mg  daily. She presents today for BMET and BP check.  Pt reports that the lower dose of lisinopril has still been making her feel dizzy, lightheaded, and fatigued throughout the day. Her home BP readings have ranged 123/58 - 149/63. She did not experience any dizziness prior to starting lisinopril.  Current HTN meds: amlodipine 5mg  daily, nebivolol 5mg  daily, lisinopril 10mg  daily Previously tried: Did not tolerate amlodipine 10mg  daily due to fatigue BP goal: <150/50mmHg  Family History: Diabetes in her mother; Heart disease in her mother; Hypertension in her father and mother; Stroke in her father.   Social History: Patient denies tobacco, alcohol, and illicit drug use.   Wt Readings from Last 3 Encounters:  06/11/16 137 lb 6.4 oz (62.3 kg)  05/14/16 137 lb 6.4 oz (62.3 kg)  04/25/16 138 lb (62.6 kg)   BP Readings from Last 3 Encounters:  06/11/16 134/82  06/05/16 (!) 152/60  05/16/16 (!) 144/56   Pulse Readings from Last 3 Encounters:  06/11/16 (!) 45  06/05/16 (!) 43  05/16/16 (!) 44    Renal function: CrCl cannot be calculated (Unknown ideal weight.).  Past Medical History:  Diagnosis Date  . Anxiety   . Asthma   . Benign essential hypertension   . Bradycardia, drug induced 06/11/2016  . Edema extremities   . GERD (gastroesophageal reflux disease)   . Glaucoma   . Osteoarthritis   . PVD (peripheral  vascular disease) ([place])   . Seasonal allergies   . Stroke [facility])     Current Outpatient Prescriptions on File Prior to Visit  Medication Sig Dispense Refill  . acetaminophen (TYLENOL) 500 MG tablet Take 1 tablet (500 mg total) by mouth every 6 (six) hours as needed for moderate pain. 30 tablet 0  . albuterol (PROVENTIL HFA;VENTOLIN HFA) 108 (90 Base) MCG/ACT inhaler Inhale 2 puffs into the lungs every 6 (six) hours as needed for wheezing. 1 Inhaler 5  . alum hydroxide-mag trisilicate (GAVISCON) [id number] MG CHEW chewable tablet Chew 1 tablet by mouth 3 (three) times daily as needed for flatulence (bowel spasms). 1 each 0  . amLODipine (NORVASC) 5 MG tablet TAKE 1 TABLET (5 MG TOTAL) BY MOUTH DAILY. 30 tablet 5  . atorvastatin (LIPITOR) 10 MG tablet TAKE 1 TABLET (10 MG TOTAL) BY MOUTH DAILY AT 6 PM. 30 tablet 3  . B Complex Vitamins (B COMPLEX PO) Take 1 tablet by mouth daily.    . bimatoprost (LUMIGAN) 0.01 % SOLN Place 1 drop into both eyes at bedtime.    . brinzolamide (AZOPT) 1 % ophthalmic suspension Place 1 drop into both eyes 3 (three) times daily. Reported on 01/08/2016    . Calcium Carbonate-Vitamin D (CALTRATE 600+D) 600-400 MG-UNIT per tablet  Take 2 tablets by mouth daily.    . Cholecalciferol (VITAMIN D3) 2000 units TABS Take 2,000 Units by mouth daily.    . clopidogrel (PLAVIX) 75 MG tablet TAKE 1 TABLET (75 MG TOTAL) BY MOUTH DAILY. 90 tablet 1  . Coenzyme Q-10 100 MG capsule Take 100 mg by mouth daily.    . fluticasone (FLONASE) 50 MCG/ACT nasal spray Place 1 spray into the nose daily.    . hydrocortisone (ANUSOL-HC) 2.5 % rectal cream Place 1 application rectally 2 (two) times daily. 30 g 3  . lisinopril (PRINIVIL,ZESTRIL) 10 MG tablet Take 1/2 tablet ( 5 mg ) daily 90 tablet 3  . loteprednol (LOTEMAX) 0.5 % ophthalmic suspension Place 1 drop into both eyes daily.     . Multiple Vitamins-Minerals (SENIOR MULTIVITAMIN PLUS) TABS Take by mouth.    . nebivolol (BYSTOLIC) 5 MG tablet  Take 1 tablet (5 mg total) by mouth daily. 30 tablet 11  . nystatin cream (MYCOSTATIN) APPLY 1 APPLICATION TOPICALLY 3 (THREE) TIMES DAILY. 30 g 0  . Omega-3 Fatty Acids (FISH OIL) 1200 MG CPDR Take one tablet by mouth once daily    . pantoprazole (PROTONIX) 40 MG tablet Take 1 tablet (40 mg total) by mouth daily. 30 tablet 3  . RESTASIS 0.05 % ophthalmic emulsion Place 1 drop into both eyes 2 (two) times daily.     . risperiDONE (RISPERDAL) 0.5 MG tablet TAKE 1 TABLET BY MOUTH TWICE A DAY 60 tablet 5  . senna-docusate (SENOKOT-S) 8.6-50 MG tablet Take 1 tablet by mouth daily. 30 tablet 3  . SYMBICORT 80-4.5 MCG/ACT inhaler Place 2 puffs into alternate nostrils 2 (two) times daily.    . vitamin C (ASCORBIC ACID) 500 MG tablet Take 500 mg by mouth daily.     No current facility-administered medications on file prior to visit.     Allergies  Allergen Reactions  . Aspirin     sweats  . Codeine   . Iodine     Pt is not aware of this allergy, does not recall much info....//a.c.  . Prednisone     sweats  . Sulfa Antibiotics     Doesn't remember     Assessment/Plan:  1. Hypertension - BP at goal < 15075mmHg however pt has remained dizzy on lower dose of lisinopril. Will have her d/c lisinopril given patient's advanced age and increased risk for falls. She will continue on amlodipine 5mg  and nebivolol 5mg  but will space these out during they day. Advised pt to record her BP readings at home and bring these in addition to her BP cuff to clinic for f/u visit in 2 weeks.   [name] [name] E. Reona Zendejas, PharmD, [place] [id number] [place], [place] [address] Phone: [phone]; Fax: [phone] 06/26/2016 11:48 AM

## 2016-06-27 ENCOUNTER — Other Ambulatory Visit: Payer: Self-pay | Admitting: *Deleted

## 2016-06-27 ENCOUNTER — Encounter: Payer: Self-pay | Admitting: Nurse Practitioner

## 2016-06-27 ENCOUNTER — Ambulatory Visit (INDEPENDENT_AMBULATORY_CARE_PROVIDER_SITE_OTHER): Payer: Medicare Other | Admitting: Nurse Practitioner

## 2016-06-27 VITALS — BP 118/74 | HR 58 | Temp 98.0°F | Resp 17 | Ht 62.0 in | Wt 136.2 lb

## 2016-06-27 DIAGNOSIS — R10819 Abdominal tenderness, unspecified site: Secondary | ICD-10-CM

## 2016-06-27 DIAGNOSIS — Z23 Encounter for immunization: Secondary | ICD-10-CM

## 2016-06-27 DIAGNOSIS — I639 Cerebral infarction, unspecified: Secondary | ICD-10-CM

## 2016-06-27 NOTE — Progress Notes (Signed)
Careteam: Patient Care Team: Gayland Curry, DO as PCP - General (Geriatric Medicine) Loletta Specter, RN as [place] Management  Advanced Directive information Does patient have an advance directive?: Yes, Type of Advance Directive: Healthcare Power of [place];Living will  Allergies  Allergen Reactions  . Aspirin     sweats  . Codeine   . Iodine     Pt is not aware of this allergy, does not recall much info....//a.c.  . Prednisone     sweats  . Sulfa Antibiotics     Doesn't remember    Chief Complaint  Patient presents with  . Acute Visit    Knot below naval, to the left. Knot can be felt but it cannot be seen. First noticed it about a month ago.   . Other    Does want flu vaccine     HPI: Patient is a 82 y.o. female seen in the office today because her "belly is acting up". Reports soreness to abdomen and there is a knot in it. Noticed this 1 month ago. When she noticed it 1 month ago after she ate the area hurt. Noticed discomfort to left lower quadrant. No N/V, diarrhea or constipation.  No fevers or chills.  Good appetite.  No pain with urination.  Tender when she sits with her hands across her stomach.  Can not give a number for the pain because it does not hurt but is tender.    Review of Systems:  Review of Systems  Constitutional: Negative for activity change, appetite change, fatigue and unexpected weight change.  HENT: Negative for congestion and hearing loss.   Eyes: Negative.   Respiratory: Negative for cough and shortness of breath.   Cardiovascular: Negative for chest pain, palpitations and leg swelling.  Gastrointestinal: Negative for abdominal distention, abdominal pain, constipation and diarrhea.       Tenderness to lower abdomen   Genitourinary: Negative for difficulty urinating and dysuria.  Skin: Negative for color change and wound.  Psychiatric/Behavioral: Positive for confusion.    Past Medical History:    Diagnosis Date  . Anxiety   . Asthma   . Benign essential hypertension   . Bradycardia, drug induced 06/11/2016  . Edema extremities   . GERD (gastroesophageal reflux disease)   . Glaucoma   . Osteoarthritis   . PVD (peripheral vascular disease) ([place])   . Seasonal allergies   . Stroke [facility])    Past Surgical History:  Procedure Laterality Date  . ABDOMINAL HYSTERECTOMY  1981  . TONSILLECTOMY  1953   Social History:   reports that she has never smoked. She has never used smokeless tobacco. She reports that she does not drink alcohol or use drugs.  Family History  Problem Relation Age of Onset  . Stroke Father   . Hypertension Father   . Heart disease Mother   . Hypertension Mother   . Diabetes Mother     Medications: Patient's Medications  New Prescriptions   No medications on file  Previous Medications   ACETAMINOPHEN (TYLENOL) 500 MG TABLET    Take 1 tablet (500 mg total) by mouth every 6 (six) hours as needed for moderate pain.   ALBUTEROL (PROVENTIL HFA;VENTOLIN HFA) 108 (90 BASE) MCG/ACT INHALER    Inhale 2 puffs into the lungs every 6 (six) hours as needed for wheezing.   ALUM HYDROXIDE-MAG TRISILICATE (GAVISCON) [id number] MG CHEW CHEWABLE TABLET    Chew 1 tablet by mouth 3 (three) times daily  as needed for flatulence (bowel spasms).   AMLODIPINE (NORVASC) 5 MG TABLET    TAKE 1 TABLET (5 MG TOTAL) BY MOUTH DAILY.   ATORVASTATIN (LIPITOR) 10 MG TABLET    TAKE 1 TABLET (10 MG TOTAL) BY MOUTH DAILY AT 6 PM.   B COMPLEX VITAMINS (B COMPLEX PO)    Take 1 tablet by mouth daily.   BIMATOPROST (LUMIGAN) 0.01 % SOLN    Place 1 drop into both eyes at bedtime.   BRINZOLAMIDE (AZOPT) 1 % OPHTHALMIC SUSPENSION    Place 1 drop into both eyes 3 (three) times daily. Reported on 01/08/2016   CALCIUM CARBONATE-VITAMIN D (CALTRATE 600+D) 600-400 MG-UNIT PER TABLET    Take 2 tablets by mouth daily.   CHOLECALCIFEROL (VITAMIN D3) 2000 UNITS TABS    Take 2,000 Units by mouth daily.   CLOPIDOGREL  (PLAVIX) 75 MG TABLET    TAKE 1 TABLET (75 MG TOTAL) BY MOUTH DAILY.   COENZYME Q-10 100 MG CAPSULE    Take 100 mg by mouth daily.   FLUTICASONE (FLONASE) 50 MCG/ACT NASAL SPRAY    Place 1 spray into the nose daily.   HYDROCORTISONE (ANUSOL-HC) 2.5 % RECTAL CREAM    Place 1 application rectally 2 (two) times daily.   LOTEPREDNOL (LOTEMAX) 0.5 % OPHTHALMIC SUSPENSION    Place 1 drop into both eyes daily.    MULTIPLE VITAMINS-MINERALS (SENIOR MULTIVITAMIN PLUS) TABS    Take by mouth.   NEBIVOLOL (BYSTOLIC) 5 MG TABLET    Take 1 tablet (5 mg total) by mouth daily.   NYSTATIN CREAM (MYCOSTATIN)    APPLY 1 APPLICATION TOPICALLY 3 (THREE) TIMES DAILY.   OMEGA-3 FATTY ACIDS (FISH OIL) 1200 MG CPDR    Take one tablet by mouth once daily   PANTOPRAZOLE (PROTONIX) 40 MG TABLET    Take 1 tablet (40 mg total) by mouth daily.   RESTASIS 0.05 % OPHTHALMIC EMULSION    Place 1 drop into both eyes 2 (two) times daily.    RISPERIDONE (RISPERDAL) 0.5 MG TABLET    TAKE 1 TABLET BY MOUTH TWICE A DAY   SENNA-DOCUSATE (SENOKOT-S) 8.6-50 MG TABLET    Take 1 tablet by mouth daily.   SYMBICORT 80-4.5 MCG/ACT INHALER    Place 2 puffs into alternate nostrils 2 (two) times daily.   VITAMIN C (ASCORBIC ACID) 500 MG TABLET    Take 500 mg by mouth daily.  Modified Medications   No medications on file  Discontinued Medications   No medications on file     Physical Exam:  Vitals:   06/27/16 1451  BP: 118/74  Pulse: (!) 58  Resp: 17  Temp: 98 F (36.7 C)  TempSrc: Oral  SpO2: 97%  Weight: 136 lb 3.2 oz (61.8 kg)  Height: 5\' 2"  (1.575 m)   Body mass index is 24.91 kg/m.  Physical Exam  Constitutional: She is oriented to person, place, and time. She appears well-developed and well-nourished.  Cardiovascular: Normal rate, regular rhythm and intact distal pulses.   Click; using compression hose  Pulmonary/Chest: Effort normal and breath sounds normal. No respiratory distress.  Abdominal: Soft. Bowel sounds are  normal. She exhibits no distension. There is tenderness.  Pt with increase tenderness to lower abdomen, midline to old hysterectomy scar. ~2 cm nodule noted along scar  Musculoskeletal: Normal range of motion.  Neurological: She is alert and oriented to person, place, and time.  Skin: Skin is warm and dry.  Psychiatric: She has a normal mood  and affect.    Labs reviewed: Basic Metabolic Panel:  Recent Labs  12/09/15 1414 12/09/15 1421 04/22/16 1118 06/26/16 1056  NA 142 142 143 142  K 3.9 3.9 3.9 3.7  CL 108 105 109 110  CO2 25  --  25 24  GLUCOSE 122* 122* 92 115*  BUN 20 22* 21 22  CREATININE 0.80 0.90 1.04* 0.85  CALCIUM 9.3  --  9.2 9.6   Liver Function Tests:  Recent Labs  12/09/15 1414 04/22/16 1118  AST 22 25  ALT 18 29  ALKPHOS 63 71  BILITOT 0.9 1.0  PROT 6.6 6.5  ALBUMIN 3.7 3.9   No results for input(s): LIPASE, AMYLASE in the last 8760 hours. No results for input(s): AMMONIA in the last 8760 hours. CBC:  Recent Labs  12/09/15 1414 12/09/15 1421 04/22/16 1118  WBC 5.3  --  4.8  NEUTROABS 3.1  --  2,688  HGB 11.5* 12.6 11.2*  HCT 33.7* 37.0 34.6*  MCV 90.1  --  95.8  PLT 211  --  178   Lipid Panel:  Recent Labs  12/10/15 0549  CHOL 173  HDL 86  LDLCALC 80  TRIG 34  CHOLHDL 2.0   TSH: No results for input(s): TSH in the last 8760 hours. A1C: Lab Results  Component Value Date   HGBA1C 5.6 04/22/2016     Assessment/Plan 1. Encounter for immunization - Flu Vaccine QUAD 36+ mos IM  2. Abdominal tenderness With new nodule/mass noted, possible scar tissue from previous hysterectomy  - CT ABDOMEN PELVIS W WO CONTRAST to evaluate at this time    Carlos American. Harle Battiest  [facility] [phone] 8 am - 5 pm) [phone] (after hours)

## 2016-06-28 ENCOUNTER — Other Ambulatory Visit: Payer: Self-pay

## 2016-06-28 NOTE — Patient Outreach (Signed)
[facility]) Care Management  06/28/16  JARIEL FARID [birth year] [id number]   Successful outreach completed with patient. Patient identification verified.   Patient reports that she is doing well. Stated that she has been seeing her cardiologist for her blood pressure and they have been adjusting her medications. Patient reports that she was started on lisinopril, but had to be taken off because it was causing her to be too dizzy. She stated that she is now taking bystolic 5 mg once a day (in the evening) and amlodipine 5 mg once a day (in the morning). She reports that she just started this yesterday. She reports that she is still checking her blood pressures several times per day. She stated that she has a follow up appointment with the hypertension clinic in 2 weeks and she is worried her medication will not manage her blood pressure until then. Patient denies any headaches, dizziness, visual problems. RNCM encouraged to continue checking her blood pressure as she has been doing and also if she feels any symptoms. Educated to report higher than normal readings to her doctor and to report any symptoms of high blood pressure and patient verbalized understanding. Patient reports her new goal for BP management is 150/90.  Patient has no other questions or concerns at present. RNCM encouraged patient to call with any needs and ensured she has RNCM contact information and contact info for 24 hour nurse line.  Home visit scheduled to look at blood pressure readings next week.  [place] R. Aliena Ghrist, RN, BSN, [place] Management Coordinator [phone]

## 2016-07-02 ENCOUNTER — Other Ambulatory Visit: Payer: Self-pay

## 2016-07-11 ENCOUNTER — Other Ambulatory Visit: Payer: Self-pay | Admitting: Nurse Practitioner

## 2016-07-11 ENCOUNTER — Ambulatory Visit
Admission: RE | Admit: 2016-07-11 | Discharge: 2016-07-11 | Disposition: A | Discharge status: Home / Self Care | Payer: Medicare Other | Source: Ambulatory Visit | Attending: Nurse Practitioner | Admitting: Nurse Practitioner

## 2016-07-11 DIAGNOSIS — N281 Cyst of kidney, acquired: Secondary | ICD-10-CM | POA: Diagnosis not present

## 2016-07-11 DIAGNOSIS — R10819 Abdominal tenderness, unspecified site: Secondary | ICD-10-CM

## 2016-07-11 NOTE — Progress Notes (Signed)
[name]                 DOB: [birth year]                      MRN: [id number]     HPI: [name] [name] is a very pleasant 82 y.o. female referred by Dr. Radford Pax to HTN clinic who presents today with her daughter. PMH is significant for HTN, asthma, occasional LE edema, GERD, and stroke. Pt was seen in the office 2 weeks ago; lisinopril 10mg  daily was initiated and nebivolol dose was decreased to 5mg  daily due to bradycardia. Pt had continued to experience dizziness on reduced dose of lisinopril, so lisinopril was discontinued 2 weeks ago.   She had an office visit the day after her last one at [place], BP at that visit was 118/74. Also had home health check her BP last week and readings were 138/62 with recheck of 112/58.   Pt has seen an improvement in dizziness and fatigue since discontinuing her lisinopril at last visit. Has been spacing out her amlodipine (8am) and nebivolol (8pm). Home readings have been higher, ranging 150s-170s/60-70s, HR 50. She does report that she will recheck her BP and on the second check, her readings ar elower. Record readings are from her first check.  Pt brought home cuff to clinic today. Reading with home cuff: 149/71. Clinic reading: 144/62.  Current HTN meds: amlodipine 5mg  daily, nebivolol 5mg  daily Previously tried: Did not tolerate amlodipine 10mg  or lisinopril -  fatigue/dizziness BP goal: <150/31mmHg  Family History: Diabetes in her mother; Heart disease in her mother; Hypertension in her father and mother; Stroke in her father.  Social History: Patient denies tobacco, alcohol, and illicit drug use.  Wt Readings from Last 3 Encounters:  06/27/16 136 lb 3.2 oz (61.8 kg)  06/11/16 137 lb 6.4 oz (62.3 kg)  05/14/16 137 lb 6.4 oz (62.3 kg)   BP Readings from Last 3 Encounters:  07/02/16 138/62  06/27/16 118/74  06/26/16 132/62   Pulse Readings from Last 3 Encounters:  07/02/16 (!) 50  06/27/16 (!) 58  06/26/16 (!) 53     Renal function: Estimated Creatinine Clearance: 38.8 mL/min (by C-G formula based on SCr of 0.85 mg/dL).  Past Medical History:  Diagnosis Date  . Anxiety   . Asthma   . Benign essential hypertension   . Bradycardia, drug induced 06/11/2016  . Edema extremities   . GERD (gastroesophageal reflux disease)   . Glaucoma   . Osteoarthritis   . PVD (peripheral vascular disease) ([place])   . Seasonal allergies   . Stroke [facility])     Current Outpatient Prescriptions on File Prior to Visit  Medication Sig Dispense Refill  . acetaminophen (TYLENOL) 500 MG tablet Take 1 tablet (500 mg total) by mouth every 6 (six) hours as needed for moderate pain. 30 tablet 0  . albuterol (PROVENTIL HFA;VENTOLIN HFA) 108 (90 Base) MCG/ACT inhaler Inhale 2 puffs into the lungs every 6 (six) hours as needed for wheezing. 1 Inhaler 5  . alum hydroxide-mag trisilicate (GAVISCON) [id number] MG CHEW chewable tablet Chew 1 tablet by mouth 3 (three) times daily as needed for flatulence (bowel spasms). 1 each 0  . amLODipine (NORVASC) 5 MG tablet TAKE 1 TABLET (5 MG TOTAL) BY MOUTH DAILY. 30 tablet 5  . atorvastatin (LIPITOR) 10 MG tablet TAKE 1 TABLET (10 MG TOTAL) BY MOUTH DAILY AT 6 PM. 30 tablet 3  . B  Complex Vitamins (B COMPLEX PO) Take 1 tablet by mouth daily.    . bimatoprost (LUMIGAN) 0.01 % SOLN Place 1 drop into both eyes at bedtime.    . brinzolamide (AZOPT) 1 % ophthalmic suspension Place 1 drop into both eyes 3 (three) times daily. Reported on 01/08/2016    . Calcium Carbonate-Vitamin D (CALTRATE 600+D) 600-400 MG-UNIT per tablet Take 2 tablets by mouth daily.    . Cholecalciferol (VITAMIN D3) 2000 units TABS Take 2,000 Units by mouth daily.    . clopidogrel (PLAVIX) 75 MG tablet TAKE 1 TABLET (75 MG TOTAL) BY MOUTH DAILY. 90 tablet 1  . Coenzyme Q-10 100 MG capsule Take 100 mg by mouth daily.    . fluticasone (FLONASE) 50 MCG/ACT nasal spray Place 1 spray into the nose daily.    . hydrocortisone (ANUSOL-HC) 2.5  % rectal cream Place 1 application rectally 2 (two) times daily. 30 g 3  . loteprednol (LOTEMAX) 0.5 % ophthalmic suspension Place 1 drop into both eyes daily.     . Multiple Vitamins-Minerals (SENIOR MULTIVITAMIN PLUS) TABS Take by mouth.    . nebivolol (BYSTOLIC) 5 MG tablet Take 1 tablet (5 mg total) by mouth daily. 30 tablet 11  . nystatin cream (MYCOSTATIN) APPLY 1 APPLICATION TOPICALLY 3 (THREE) TIMES DAILY. 30 g 0  . Omega-3 Fatty Acids (FISH OIL) 1200 MG CPDR Take one tablet by mouth once daily    . pantoprazole (PROTONIX) 40 MG tablet Take 1 tablet (40 mg total) by mouth daily. 30 tablet 3  . RESTASIS 0.05 % ophthalmic emulsion Place 1 drop into both eyes 2 (two) times daily.     . risperiDONE (RISPERDAL) 0.5 MG tablet TAKE 1 TABLET BY MOUTH TWICE A DAY 60 tablet 5  . senna-docusate (SENOKOT-S) 8.6-50 MG tablet Take 1 tablet by mouth daily. 30 tablet 3  . SYMBICORT 80-4.5 MCG/ACT inhaler Place 2 puffs into alternate nostrils 2 (two) times daily.    . vitamin C (ASCORBIC ACID) 500 MG tablet Take 500 mg by mouth daily.     No current facility-administered medications on file prior to visit.     Allergies  Allergen Reactions  . Aspirin     sweats  . Codeine   . Iodine     Pt is not aware of this allergy, does not recall much info....//a.c.  . Prednisone     sweats  . Sulfa Antibiotics     Doesn't remember     Assessment/Plan:  1. Hypertension - BP at goal <150/9mmHg. Clinic readings and home readings by the nurse have all been at goal. When pt checks her own blood pressure, systolic BP ranges 0000000. Her recorded readings are from her first check, but she states that when she repeats a BP check, the second reading is always lower. Advised her to trust the second reading rather than the first. Recent systolic BP checks in clinic have ranged 112-150. Will not make any medication changes given patient's advanced age. She will continue amlodipine and nebivolol. Advised her to  check her BP no more than once a day and if the first reading is elevated, to sit a few minutes and then recheck. Advised that if she notices multiple elevated readings with systolic BP of [phone] to call clinic.   Amyre Segundo E. Maiana Hennigan, PharmD, [place] [id number], [place], [place] [address] Phone: [phone]; Fax: [phone] 07/12/2016 11:29 AM

## 2016-07-11 NOTE — Patient Outreach (Signed)
[employer] [facility]) Care Management  07/11/2016  VALENDA MCLEARY 16-Aug-1924 [id number]  Late entry for 07/02/2016  Subjective: Follow up home visit completed. Patient has no complaints at present. Objective: BP checked twice manually during visit. At beginning of visit, manual BP in left arm via RN 138/62, Second reading before visit ended manual BP left arm checked via RN: 112/58    BP via [employer] automatic cuff via RN: 118/64   P 47-50  R 16 SpO2 98% Assessment: Patient denies any pain, headaches, dizziness, numbness or tingling, visual changes. Patient has only been taking Amlodipine 5 mg every morning and Bystolic 5 mg every evening since 06/27/2016. Patient records of blood pressure readings are as follows:                       AM            PM 9/17:      158/69        130/57 9/18:      136/68        123/58  9/19:      146/72        130/54 9/20:      180/74         175/74 9/21:      131/64          142/66    (stopped taking Lisinopril) 9/22:      159/71          148/69    9/23:      180/63          178/68 9/24:       157/70         135/64 9/25:       158/66         162/62 9/26:      169/76 Patient has no other concerns. She has an appointment on 10/6 with the hypertension clinic for a 2 week follow up. Encouraged to call 911 with any signs or symptoms of elevated blood pressure, stroke or heart attack and patient verbalized understanding due to her history of CVA with no significant symptoms.  Plan: Patient will continue monitoring her blood pressure readings and take them to her follow up appointment on 10/6. RNCM will follow up with patient in a couple of weeks via phone and patient is agreeable. Patient has RNCM contact information and encouraged to call with any questions or concerns. [place] R. Talin Rozeboom, RN, BSN, [place] Management Coordinator [phone]

## 2016-07-12 ENCOUNTER — Ambulatory Visit (INDEPENDENT_AMBULATORY_CARE_PROVIDER_SITE_OTHER): Payer: Medicare Other | Admitting: Pharmacist

## 2016-07-12 VITALS — BP 144/62 | HR 52

## 2016-07-12 DIAGNOSIS — I639 Cerebral infarction, unspecified: Secondary | ICD-10-CM | POA: Diagnosis not present

## 2016-07-12 DIAGNOSIS — I10 Essential (primary) hypertension: Secondary | ICD-10-CM | POA: Diagnosis not present

## 2016-07-16 ENCOUNTER — Encounter: Payer: Self-pay | Admitting: Podiatry

## 2016-07-16 ENCOUNTER — Ambulatory Visit: Payer: Medicare Other

## 2016-07-16 ENCOUNTER — Ambulatory Visit (INDEPENDENT_AMBULATORY_CARE_PROVIDER_SITE_OTHER): Payer: Medicare Other | Admitting: Podiatry

## 2016-07-16 VITALS — BP 172/79 | HR 51 | Resp 14

## 2016-07-16 DIAGNOSIS — M79676 Pain in unspecified toe(s): Secondary | ICD-10-CM | POA: Diagnosis not present

## 2016-07-16 DIAGNOSIS — B351 Tinea unguium: Secondary | ICD-10-CM | POA: Diagnosis not present

## 2016-07-16 DIAGNOSIS — G5793 Unspecified mononeuropathy of bilateral lower limbs: Secondary | ICD-10-CM

## 2016-07-16 NOTE — Progress Notes (Signed)
[name], female   DOB: 1923-11-20, 82 y.o.   MRN: [id number] Complaint:  Visit Type: Patient returns to my office for continued preventative foot care services. Complaint: Patient states" my nails have grown long and thick and become painful to walk and wear shoes" Patient has been diagnosed with neuropathy both feet.. The patient presents for preventative foot care services. No changes to ROS.  She has painful callus both feet which is painful walking in her shoes  Podiatric Exam: Vascular: dorsalis pedis and posterior tibial pulses are palpable bilateral. Capillary return is immediate. Temperature gradient is WNL. Skin turgor WNL  Sensorium: Normal Semmes Weinstein monofilament test. Normal tactile sensation bilaterally. Nail Exam: Pt has thick disfigured discolored nails with subungual debris noted bilateral entire nail hallux through fifth toenails Ulcer Exam: There is no evidence of ulcer or pre-ulcerative changes or infection. Orthopedic Exam: Muscle tone and strength are WNL. No limitations in general ROM. No crepitus or effusions noted. Foot type and digits show no abnormalities. Bony prominences are unremarkable. Skin: No evidence of callus .   Onychomycosis, , Pain in right toe, pain in left toes, porokeratosis B/L  Treatment & Plan Procedures and Treatment: Consent by patient was obtained for treatment procedures. The patient understood the discussion of treatment and procedures well. All questions were answered thoroughly reviewed. Debridement of mycotic and hypertrophic toenails, 1 through 5 bilateral and clearing of subungual debris. No ulceration, no infection noted. Debridement of porokeratosis B/L Return Visit-Office Procedure: Patient instructed to return to the office for a follow up visit 3 months for continued evaluation and treatment.    Gardiner Barefoot DPM

## 2016-07-17 ENCOUNTER — Ambulatory Visit: Payer: Self-pay

## 2016-07-18 ENCOUNTER — Other Ambulatory Visit: Payer: Self-pay

## 2016-07-18 NOTE — Patient Outreach (Signed)
[facility]) Care Management  07/18/16  NOREENE OLIVIA Sep 08, 1924 [id number]  Attempted to reach patient without success. Phone multiple times with no answer and no option to leave a voicemail.  [place] R. Jelena Malicoat, RN, BSN, [place] Management Coordinator [phone]

## 2016-07-26 ENCOUNTER — Encounter: Payer: Self-pay | Admitting: Internal Medicine

## 2016-07-26 ENCOUNTER — Ambulatory Visit (INDEPENDENT_AMBULATORY_CARE_PROVIDER_SITE_OTHER): Payer: Medicare Other | Admitting: Internal Medicine

## 2016-07-26 VITALS — BP 138/70 | HR 58 | Temp 97.8°F | Wt 136.0 lb

## 2016-07-26 DIAGNOSIS — K5902 Outlet dysfunction constipation: Secondary | ICD-10-CM | POA: Diagnosis not present

## 2016-07-26 DIAGNOSIS — I872 Venous insufficiency (chronic) (peripheral): Secondary | ICD-10-CM

## 2016-07-26 DIAGNOSIS — I639 Cerebral infarction, unspecified: Secondary | ICD-10-CM

## 2016-07-26 DIAGNOSIS — M722 Plantar fascial fibromatosis: Secondary | ICD-10-CM

## 2016-07-26 DIAGNOSIS — I10 Essential (primary) hypertension: Secondary | ICD-10-CM

## 2016-07-26 DIAGNOSIS — Z23 Encounter for immunization: Secondary | ICD-10-CM

## 2016-07-26 DIAGNOSIS — K224 Dyskinesia of esophagus: Secondary | ICD-10-CM

## 2016-07-26 DIAGNOSIS — F22 Delusional disorders: Secondary | ICD-10-CM | POA: Diagnosis not present

## 2016-07-26 MED ORDER — DOCUSATE SODIUM 100 MG PO CAPS: 100.0000 mg | ORAL_CAPSULE | Freq: Every day | ORAL | 1 refills | Status: DC | PRN

## 2016-07-26 MED ORDER — ZOSTER VACCINE LIVE 19400 UNT/0.65ML ~~LOC~~ SUSR: 0.6500 mL | Freq: Once | SUBCUTANEOUS | 0 refills | Status: AC

## 2016-07-26 MED ORDER — TETANUS-DIPHTH-ACELL PERTUSSIS 5-2.5-18.5 LF-MCG/0.5 IM SUSP: 0.5000 mL | Freq: Once | INTRAMUSCULAR | 0 refills | Status: AC

## 2016-07-26 NOTE — Progress Notes (Signed)
Location:  [facility] clinic Provider:  Clemon Devaul [name], D.O., C.M.D.  Code Status: DNR Goals of Care:  Advanced Directives 06/27/2016  Does patient have an advance directive? Yes  Type of Paramedic of [place];Living will  Does patient want to make changes to advanced directive? -  Copy of advanced directive(s) in chart? No - copy requested  Would patient like information on creating an advanced directive? -   Chief Complaint  Patient presents with  . Medical Management of Chronic Issues    3 mth follow-up, abdominal pain    HPI: Patient is a 82 y.o. female seen today for medical management of chronic diseases and acute visit for abdominal pain.   Ct abd/pelvis reviewed with pt and her daughter: 1. Moderate fecal residue within large bowel consistent with constipation. No acute inflammatory process. 2. Bilateral renal cysts are noted. The previously noted left interpolar left renal 8 mm cyst is more dense on current exam and may represent internal proteinaceous or hemorrhagic components within. It has not significantly changed in size. The other cysts are stable in appearance and size. 3. Lower lumbar degenerative disc and facet arthropathy with grade 1 spondylolisthesis of L4 on L5 contributing to severe spinal stenosis. No significant change.   GI visit from Feb:  Long discussion with pt and daughter regarding EGD with dilation if indicated , patient would like to think about this and will call back if she decides to proceed with EGD. We discussed risks and benefits and increased risk of complication given her advanced age. In the interim continue management with small bites, cutting food up into tiny pieces and sipping liquids while eating. Patient to remain upright for 2 hours postprandially.  Patient will be established with Dr. Loletha Carrow.  Following the small bites and sips approach has been effective.  Has not had to go back to GI.    Still feels the place  on the left side of her stomach in the lower quadrant.  Sometimes will go up near her navel and over to the right.  Has a BM each day.  Not free-flowing, they are sluggish.  No pain during attempts at [facility]  Strains a little, but not that much.  Long stools. Not hard, but in between.  No increase in flatus.  No blood or dark stools.  Still takes the gaviscon.  Stopped using the senokot--felt like she had more pain when on it.  Is taking the protonix.    BP better lately also.  Notes on that with cardiology reviewed.  HR 58.  Past Medical History:  Diagnosis Date  . Anxiety   . Asthma   . Benign essential hypertension   . Bradycardia, drug induced 06/11/2016  . Edema extremities   . GERD (gastroesophageal reflux disease)   . Glaucoma   . Osteoarthritis   . PVD (peripheral vascular disease) ([place])   . Seasonal allergies   . Stroke [facility])     Past Surgical History:  Procedure Laterality Date  . ABDOMINAL HYSTERECTOMY  1981  . TONSILLECTOMY  1953    Allergies  Allergen Reactions  . Aspirin     sweats  . Codeine   . Iodine     Pt is not aware of this allergy, does not recall much info....//a.c.  . Prednisone     sweats  . Sulfa Antibiotics     Doesn't remember      Medication List       Accurate as of 07/26/16  11:27 AM. Always use your most recent med list.          acetaminophen 500 MG tablet Commonly known as:  TYLENOL Take 1 tablet (500 mg total) by mouth every 6 (six) hours as needed for moderate pain.   albuterol 108 (90 Base) MCG/ACT inhaler Commonly known as:  PROVENTIL HFA;VENTOLIN HFA Inhale 2 puffs into the lungs every 6 (six) hours as needed for wheezing.   alum hydroxide-mag trisilicate [id number] MG Chew chewable tablet Commonly known as:  GAVISCON Chew 1 tablet by mouth 3 (three) times daily as needed for flatulence (bowel spasms).   amLODipine 5 MG tablet Commonly known as:  NORVASC TAKE 1 TABLET (5 MG TOTAL) BY MOUTH DAILY.   atorvastatin 10 MG  tablet Commonly known as:  LIPITOR TAKE 1 TABLET (10 MG TOTAL) BY MOUTH DAILY AT 6 PM.   B COMPLEX PO Take 1 tablet by mouth daily.   bimatoprost 0.01 % Soln Commonly known as:  LUMIGAN Place 1 drop into both eyes at bedtime.   brinzolamide 1 % ophthalmic suspension Commonly known as:  AZOPT Place 1 drop into both eyes 3 (three) times daily. Reported on 01/08/2016   CALTRATE 600+D 600-400 MG-UNIT tablet Generic drug:  Calcium Carbonate-Vitamin D Take 2 tablets by mouth daily.   clopidogrel 75 MG tablet Commonly known as:  PLAVIX TAKE 1 TABLET (75 MG TOTAL) BY MOUTH DAILY.   Coenzyme Q-10 100 MG capsule Take 100 mg by mouth daily.   Fish Oil 1200 MG Cpdr Take one tablet by mouth once daily   fluticasone 50 MCG/ACT nasal spray Commonly known as:  FLONASE Place 1 spray into the nose daily.   hydrocortisone 2.5 % rectal cream Commonly known as:  ANUSOL-HC Place 1 application rectally 2 (two) times daily.   loteprednol 0.5 % ophthalmic suspension Commonly known as:  LOTEMAX Place 1 drop into both eyes daily.   nebivolol 5 MG tablet Commonly known as:  BYSTOLIC Take 1 tablet (5 mg total) by mouth daily.   nystatin cream Commonly known as:  MYCOSTATIN APPLY 1 APPLICATION TOPICALLY 3 (THREE) TIMES DAILY.   pantoprazole 40 MG tablet Commonly known as:  PROTONIX Take 1 tablet (40 mg total) by mouth daily.   RESTASIS 0.05 % ophthalmic emulsion Generic drug:  cycloSPORINE Place 1 drop into both eyes 2 (two) times daily.   risperiDONE 0.5 MG tablet Commonly known as:  RISPERDAL TAKE 1 TABLET BY MOUTH TWICE A DAY   SENIOR MULTIVITAMIN PLUS Tabs Take by mouth.   senna-docusate 8.6-50 MG tablet Commonly known as:  Senokot-S Take 1 tablet by mouth daily.   SYMBICORT 80-4.5 MCG/ACT inhaler Generic drug:  budesonide-formoterol Place 2 puffs into alternate nostrils 2 (two) times daily.   vitamin C 500 MG tablet Commonly known as:  ASCORBIC ACID Take 500 mg by mouth  daily.   Vitamin D3 2000 units Tabs Take 2,000 Units by mouth daily.       Review of Systems:  Review of Systems  Constitutional: Negative for chills, fever and malaise/fatigue.  HENT: Negative for hearing loss.   Eyes: Negative for blurred vision.  Respiratory: Negative for cough and shortness of breath.   Cardiovascular: Negative for chest pain, palpitations and leg swelling.  Gastrointestinal: Positive for abdominal pain and constipation. Negative for blood in stool, diarrhea, heartburn, melena, nausea and vomiting.       Gas  Genitourinary: Negative for dysuria, frequency and urgency.  Musculoskeletal: Negative for falls.  Skin: Negative for itching  and rash.  Neurological: Negative for dizziness, loss of consciousness and weakness.  Endo/Heme/Allergies: Does not bruise/bleed easily.  Psychiatric/Behavioral: Negative for depression and memory loss.    Health Maintenance  Topic Date Due  . MAMMOGRAM  10/07/2018 (Originally 09/28/2009)  . ZOSTAVAX  10/07/2018 (Originally 10/04/1986)  . TETANUS/TDAP  10/07/2018 (Originally 10/04/1945)  . INFLUENZA VACCINE  Completed  . DEXA SCAN  Completed  . PNA vac Low Risk Adult  Completed    Physical Exam: Vitals:   07/26/16 1104  BP: 138/70  Pulse: (!) 58  Temp: 97.8 F (36.6 C)  TempSrc: Oral  SpO2: 98%  Weight: 136 lb (61.7 kg)   Body mass index is 24.87 kg/m. Physical Exam  Constitutional: She is oriented to person, place, and time. She appears well-developed and well-nourished. No distress.  Cardiovascular: Normal rate, regular rhythm, normal heart sounds and intact distal pulses.   Pulmonary/Chest: Effort normal and breath sounds normal. No respiratory distress.  Abdominal: Soft. Bowel sounds are normal. She exhibits no distension and no mass. There is no tenderness. There is no rebound and no guarding. No hernia.  Musculoskeletal: Normal range of motion.  Neurological: She is alert and oriented to person, place, and  time.  Skin: Skin is warm and dry.  Psychiatric: She has a normal mood and affect.    Labs reviewed: Basic Metabolic Panel:  Recent Labs  12/09/15 1414 12/09/15 1421 04/22/16 1118 06/26/16 1056  NA 142 142 143 142  K 3.9 3.9 3.9 3.7  CL 108 105 109 110  CO2 25  --  25 24  GLUCOSE 122* 122* 92 115*  BUN 20 22* 21 22  CREATININE 0.80 0.90 1.04* 0.85  CALCIUM 9.3  --  9.2 9.6   Liver Function Tests:  Recent Labs  12/09/15 1414 04/22/16 1118  AST 22 25  ALT 18 29  ALKPHOS 63 71  BILITOT 0.9 1.0  PROT 6.6 6.5  ALBUMIN 3.7 3.9   No results for input(s): LIPASE, AMYLASE in the last 8760 hours. No results for input(s): AMMONIA in the last 8760 hours. CBC:  Recent Labs  12/09/15 1414 12/09/15 1421 04/22/16 1118  WBC 5.3  --  4.8  NEUTROABS 3.1  --  2,688  HGB 11.5* 12.6 11.2*  HCT 33.7* 37.0 34.6*  MCV 90.1  --  95.8  PLT 211  --  178   Lipid Panel:  Recent Labs  12/10/15 0549  CHOL 173  HDL 86  LDLCALC 80  TRIG 34  CHOLHDL 2.0   Lab Results  Component Value Date   HGBA1C 5.6 04/22/2016    Procedures since last visit: Ct Abdomen Pelvis Wo Contrast  Result Date: 07/11/2016 CLINICAL DATA:  Left lower quadrant pain and tenderness. Gastric pain for 2 months. EXAM: CT ABDOMEN AND PELVIS WITHOUT CONTRAST TECHNIQUE: Multidetector CT imaging of the abdomen and pelvis was performed following the standard protocol without IV contrast. COMPARISON:  05/31/2013 CT FINDINGS: Lower chest: Stable mild cardiomegaly without pericardial effusion. Atelectasis at the left lung base. No effusion or pneumothorax. Hepatobiliary: No focal liver abnormality is seen. No gallstones, gallbladder wall thickening, or biliary dilatation. Pancreas: Unremarkable. No pancreatic ductal dilatation or surrounding inflammatory changes. Spleen: Normal in size without focal abnormality. Adrenals/Urinary Tract: Stable right upper pole 3 cm cyst with Hounsfield unit of 16. The interpolar pole left  renal cyst is now hyperdense possibly representing proteinaceous or hemorrhagic components within. This is estimated at 6 mm and has not increased in size. The more  medial 8 mm interpolar left renal cyst is stable. There is no nephrolithiasis or hydronephrosis. The urinary bladder is physiologically distended without calculus. Stomach/Bowel: There is normal bowel rotation. A moderate amount of fecal residue is seen within nonobstructed, nondistended large bowel. The cecum is seen in the right hemipelvis with appendix that is normal in caliber and air-filled overlying the right external iliac artery and vein. Vascular/Lymphatic: Atherosclerosis of the abdominal aorta and iliac vessels without aneurysm. Reproductive: Hysterectomy.  No adnexal mass. Other: No ascites.  No free air. Musculoskeletal: Severe degenerative disc disease at L3-4, L4-5 and L5-S1 with severe spinal stenosis at L4-5. There is grade 1 spondylolisthesis of L4 upon L5 secondary to degenerative facet arthropathy. There is levoconvex lumbar scoliosis. No acute osseous abnormality. Findings without significant change. IMPRESSION: 1. Moderate fecal residue within large bowel consistent with constipation. No acute inflammatory process. 2. Bilateral renal cysts are noted. The previously noted left interpolar left renal 8 mm cyst is more dense on current exam and may represent internal proteinaceous or hemorrhagic components within. It has not significantly changed in size. The other cysts are stable in appearance and size. 3. Lower lumbar degenerative disc and facet arthropathy with grade 1 spondylolisthesis of L4 on L5 contributing to severe spinal stenosis. No significant change. Electronically Signed   By: Ashley Royalty M.D.   On: 07/11/2016 15:39    Assessment/Plan 1. Constipation due to outlet dysfunction - recommended she try docusate sodium (COLACE) 100 MG capsule; Take 1 capsule (100 mg total) by mouth daily as needed for mild constipation (to  soften stool).  Dispense: 30 capsule; Refill: 1  2. Benign essential hypertension -bp well controlled when thn nursing checking and here   3. Esophageal dysmotility -not c/o this now today  4. Chronic venous insufficiency -stable with compression hose use  5. Paranoid type delusional disorder ([place]) -cont risperdal 0.5mg  po bid for her delusions--none mentioned at any recent visits even when her daughter was present today  6. Need for Zostavax administration - Zoster Vaccine Live, PF, (ZOSTAVAX) 65784 UNT/0.65ML injection; Inject 19,400 Units into the skin once.  Dispense: 1 each; Refill: 0  7. Need for Tdap vaccination - Tdap ([place]) 5-2.5-18.5 LF-MCG/0.5 injection; Inject 0.5 mLs into the muscle once.  Dispense: 0.5 mL; Refill: 0  Labs/tests ordered:  No orders of the defined types were placed in this encounter.  Next appt:  10/24/2016   Nahuel Wilbert L. Gradie Ohm, D.O. [facility] [address],  [address] Cell Phone (Mon-Fri 8am-5pm):  [phone] On Call:  [phone] & follow prompts after 5pm & weekends Office Phone:  [phone] Office Fax:  [phone]

## 2016-07-26 NOTE — Patient Instructions (Signed)
Try colace 100mg  once daily as needed for abdominal pain and constipation.

## 2016-07-29 ENCOUNTER — Other Ambulatory Visit: Payer: Self-pay | Admitting: *Deleted

## 2016-07-29 MED ORDER — AMLODIPINE BESYLATE 5 MG PO TABS
ORAL_TABLET | ORAL | 1 refills | Status: DC
Start: 2016-07-29 — End: 2016-12-23

## 2016-07-29 MED ORDER — RISPERIDONE 0.5 MG PO TABS: 0.5000 mg | ORAL_TABLET | Freq: Two times a day (BID) | ORAL | 1 refills | Status: DC

## 2016-07-29 MED ORDER — ATORVASTATIN CALCIUM 10 MG PO TABS: ORAL_TABLET | ORAL | 1 refills | Status: DC

## 2016-07-29 NOTE — Telephone Encounter (Signed)
[place]

## 2016-08-05 NOTE — Patient Outreach (Deleted)
[facility]) Care Management  08/05/16  AYAKA CARRIER Dec 20, 1923 [id number]

## 2016-08-12 ENCOUNTER — Ambulatory Visit (INDEPENDENT_AMBULATORY_CARE_PROVIDER_SITE_OTHER): Payer: Medicare Other | Admitting: Internal Medicine

## 2016-08-12 ENCOUNTER — Encounter: Payer: Self-pay | Admitting: Internal Medicine

## 2016-08-12 ENCOUNTER — Ambulatory Visit
Admission: RE | Admit: 2016-08-12 | Discharge: 2016-08-12 | Disposition: A | Discharge status: Home / Self Care | Payer: Medicare Other | Source: Ambulatory Visit | Attending: Internal Medicine | Admitting: Internal Medicine

## 2016-08-12 VITALS — BP 158/72 | HR 54 | Temp 97.9°F | Wt 137.0 lb

## 2016-08-12 DIAGNOSIS — I639 Cerebral infarction, unspecified: Secondary | ICD-10-CM

## 2016-08-12 DIAGNOSIS — R042 Hemoptysis: Secondary | ICD-10-CM | POA: Diagnosis not present

## 2016-08-12 DIAGNOSIS — R05 Cough: Secondary | ICD-10-CM | POA: Diagnosis not present

## 2016-08-12 DIAGNOSIS — J069 Acute upper respiratory infection, unspecified: Secondary | ICD-10-CM

## 2016-08-12 MED ORDER — BENZONATATE 100 MG PO CAPS: 100.0000 mg | ORAL_CAPSULE | Freq: Two times a day (BID) | ORAL | 0 refills | Status: DC | PRN

## 2016-08-12 NOTE — Progress Notes (Signed)
Location:  [facility] clinic Provider: Winry Egnew [name], D.O., C.M.D.  Code Status: DNR Goals of Care:  Advanced Directives 06/27/2016  Does patient have an advance directive? Yes  Type of Paramedic of [place];Living will  Does patient want to make changes to advanced directive? -  Copy of advanced directive(s) in chart? No - copy requested  Would patient like information on creating an advanced directive? -     Chief Complaint  Patient presents with  . Acute Visit    cough x3 days    HPI: Patient is a 82 y.o. female seen today for an acute visit for cough for 3 days productive of dark yellow mucus.  She is frail.  She's afebrile.  A little heaviness in the chest and throat scratchiness.  A little headache, no ear pain. No fever at home either.  Used some old cough syrup but did not help much.  Had a drop of blood when she blew her nose last night.  This morning when she got up, she coughed up deep yellow- orange sputum.  Then coughed up again and it had blood mixed with it.  Just mixed in, no clots.    Past Medical History:  Diagnosis Date  . Anxiety   . Asthma   . Benign essential hypertension   . Bradycardia, drug induced 06/11/2016  . Edema extremities   . GERD (gastroesophageal reflux disease)   . Glaucoma   . Osteoarthritis   . PVD (peripheral vascular disease) ([place])   . Seasonal allergies   . Stroke [facility])     Past Surgical History:  Procedure Laterality Date  . ABDOMINAL HYSTERECTOMY  1981  . TONSILLECTOMY  1953    Allergies  Allergen Reactions  . Aspirin     sweats  . Codeine   . Iodine     Pt is not aware of this allergy, does not recall much info....//a.c.  . Prednisone     sweats  . Sulfa Antibiotics     Doesn't remember      Medication List       Accurate as of 08/12/16 12:00 PM. Always use your most recent med list.          acetaminophen 500 MG tablet Commonly known as:  TYLENOL Take 1 tablet (500 mg total) by mouth  every 6 (six) hours as needed for moderate pain.   albuterol 108 (90 Base) MCG/ACT inhaler Commonly known as:  PROVENTIL HFA;VENTOLIN HFA Inhale 2 puffs into the lungs every 6 (six) hours as needed for wheezing.   alum hydroxide-mag trisilicate [id number] MG Chew chewable tablet Commonly known as:  GAVISCON Chew 1 tablet by mouth 3 (three) times daily as needed for flatulence (bowel spasms).   amLODipine 5 MG tablet Commonly known as:  NORVASC Take one tablet by mouth once daily   atorvastatin 10 MG tablet Commonly known as:  LIPITOR Take one tablet by mouth daily at 6pm for cholesterol   B COMPLEX PO Take 1 tablet by mouth daily.   bimatoprost 0.01 % Soln Commonly known as:  LUMIGAN Place 1 drop into both eyes at bedtime.   brinzolamide 1 % ophthalmic suspension Commonly known as:  AZOPT Place 1 drop into both eyes 3 (three) times daily. Reported on 01/08/2016   CALTRATE 600+D 600-400 MG-UNIT tablet Generic drug:  Calcium Carbonate-Vitamin D Take 2 tablets by mouth daily.   clopidogrel 75 MG tablet Commonly known as:  PLAVIX TAKE 1 TABLET (75 MG TOTAL) BY  MOUTH DAILY.   Coenzyme Q-10 100 MG capsule Take 100 mg by mouth daily.   docusate sodium 100 MG capsule Commonly known as:  COLACE Take 1 capsule (100 mg total) by mouth daily as needed for mild constipation (to soften stool).   Fish Oil 1200 MG Cpdr Take one tablet by mouth once daily   fluticasone 50 MCG/ACT nasal spray Commonly known as:  FLONASE Place 1 spray into the nose daily.   hydrocortisone 2.5 % rectal cream Commonly known as:  ANUSOL-HC Place 1 application rectally 2 (two) times daily.   loteprednol 0.5 % ophthalmic suspension Commonly known as:  LOTEMAX Place 1 drop into both eyes daily.   nebivolol 5 MG tablet Commonly known as:  BYSTOLIC Take 1 tablet (5 mg total) by mouth daily.   nystatin cream Commonly known as:  MYCOSTATIN APPLY 1 APPLICATION TOPICALLY 3 (THREE) TIMES DAILY.     pantoprazole 40 MG tablet Commonly known as:  PROTONIX Take 1 tablet (40 mg total) by mouth daily.   RESTASIS 0.05 % ophthalmic emulsion Generic drug:  cycloSPORINE Place 1 drop into both eyes 2 (two) times daily.   risperiDONE 0.5 MG tablet Commonly known as:  RISPERDAL Take 1 tablet (0.5 mg total) by mouth 2 (two) times daily.   SENIOR MULTIVITAMIN PLUS Tabs Take by mouth.   SYMBICORT 80-4.5 MCG/ACT inhaler Generic drug:  budesonide-formoterol Place 2 puffs into alternate nostrils 2 (two) times daily.   vitamin C 500 MG tablet Commonly known as:  ASCORBIC ACID Take 500 mg by mouth daily.   Vitamin D3 2000 units Tabs Take 2,000 Units by mouth daily.       Review of Systems:  Review of Systems  Constitutional: Positive for malaise/fatigue. Negative for chills and fever.  HENT: Positive for congestion and hearing loss. Negative for ear pain, sinus pain and sore throat.   Respiratory: Positive for cough, hemoptysis and sputum production. Negative for shortness of breath, wheezing and stridor.   Cardiovascular: Negative for chest pain, palpitations and leg swelling.  Gastrointestinal: Negative for abdominal pain, diarrhea, nausea and vomiting.  Genitourinary: Negative for dysuria.  Musculoskeletal: Negative for falls.  Neurological: Negative for dizziness and weakness.    Health Maintenance  Topic Date Due  . MAMMOGRAM  10/07/2018 (Originally 09/28/2009)  . ZOSTAVAX  10/07/2018 (Originally 10/04/1986)  . TETANUS/TDAP  10/07/2018 (Originally 10/04/1945)  . INFLUENZA VACCINE  Completed  . DEXA SCAN  Completed  . PNA vac Low Risk Adult  Completed    Physical Exam: Vitals:   08/12/16 1157  BP: (!) 158/72  Pulse: (!) 54  Temp: 97.9 F (36.6 C)  TempSrc: Oral  SpO2: 97%  Weight: 137 lb (62.1 kg)   Body mass index is 25.06 kg/m. Physical Exam  Constitutional: She appears well-developed and well-nourished.  Appears ill today  HENT:  Head: Normocephalic and  atraumatic.  Right Ear: External ear normal.  Left Ear: External ear normal.  Mouth/Throat: Oropharynx is clear and moist. No oropharyngeal exudate.  Nasal congestion  Eyes: Conjunctivae are normal. Right eye exhibits no discharge. Left eye exhibits no discharge.  Swelling beneath eyes  Neck: No JVD present.  Cardiovascular: Normal rate and regular rhythm.   Murmur heard. Pulmonary/Chest: Effort normal and breath sounds normal. No respiratory distress. She has no wheezes. She has no rales.  cough  Lymphadenopathy:    She has no cervical adenopathy.  Skin: Skin is warm and dry.    Labs reviewed: Basic Metabolic Panel:  Recent Labs  12/09/15 1414 12/09/15 1421 04/22/16 1118 06/26/16 1056  NA 142 142 143 142  K 3.9 3.9 3.9 3.7  CL 108 105 109 110  CO2 25  --  25 24  GLUCOSE 122* 122* 92 115*  BUN 20 22* 21 22  CREATININE 0.80 0.90 1.04* 0.85  CALCIUM 9.3  --  9.2 9.6   Liver Function Tests:  Recent Labs  12/09/15 1414 04/22/16 1118  AST 22 25  ALT 18 29  ALKPHOS 63 71  BILITOT 0.9 1.0  PROT 6.6 6.5  ALBUMIN 3.7 3.9   No results for input(s): LIPASE, AMYLASE in the last 8760 hours. No results for input(s): AMMONIA in the last 8760 hours. CBC:  Recent Labs  12/09/15 1414 12/09/15 1421 04/22/16 1118  WBC 5.3  --  4.8  NEUTROABS 3.1  --  2,688  HGB 11.5* 12.6 11.2*  HCT 33.7* 37.0 34.6*  MCV 90.1  --  95.8  PLT 211  --  178   Lipid Panel:  Recent Labs  12/10/15 0549  CHOL 173  HDL 86  LDLCALC 80  TRIG 34  CHOLHDL 2.0   Lab Results  Component Value Date   HGBA1C 5.6 04/22/2016    Assessment/Plan 1. Acute upper respiratory infection - x 3 days thus far - will treat symptoms, encouraged po fluids, rest, adequate po intake - DG Chest 2 View due to #2 (but this may have been from nasal congestion and dry nose bleeding, but want to be sure) - benzonatate (TESSALON) 100 MG capsule; Take 1 capsule (100 mg total) by mouth 2 (two) times daily as  needed for cough.  Dispense: 60 capsule; Refill: 0  2. Hemoptysis -see #1 - DG Chest 2 View - benzonatate (TESSALON) 100 MG capsule; Take 1 capsule (100 mg total) by mouth 2 (two) times daily as needed for cough.  Dispense: 60 capsule; Refill: 0  Labs/tests ordered:   Orders Placed This Encounter  Procedures  . DG Chest 2 View    Order Specific Question:   Reason for Exam (SYMPTOM  OR DIAGNOSIS REQUIRED)    Answer:   cough productive of yellow sputum, some blood tinged sputum    Order Specific Question:   Preferred imaging location?    Answer:   [facility]    Next appt:  10/24/2016  Bayyinah Dukeman L. Amarissa Koerner, D.O. [facility] [address], [place] [address] Cell Phone (Mon-Fri 8am-5pm):  [phone] On Call:  [phone] & follow prompts after 5pm & weekends Office Phone:  [phone] Office Fax:  [phone]

## 2016-08-12 NOTE — Patient Instructions (Signed)
Please use your albuterol inhaler for the coughing. Also I sent tessalon perles to your pharmacy to stop the cough so you can sleep. Please go get your chest xray at [facility].  I will use it to determine if you need more medication for your respiratory infection and to figure out why you coughed up some blood.

## 2016-08-21 NOTE — Telephone Encounter (Signed)
This encounter was created in error - please disregard.

## 2016-09-05 ENCOUNTER — Other Ambulatory Visit: Payer: Self-pay | Admitting: Internal Medicine

## 2016-09-05 DIAGNOSIS — H43812 Vitreous degeneration, left eye: Secondary | ICD-10-CM | POA: Diagnosis not present

## 2016-09-05 DIAGNOSIS — H401133 Primary open-angle glaucoma, bilateral, severe stage: Secondary | ICD-10-CM | POA: Diagnosis not present

## 2016-09-05 DIAGNOSIS — K219 Gastro-esophageal reflux disease without esophagitis: Secondary | ICD-10-CM

## 2016-09-10 ENCOUNTER — Telehealth: Payer: Self-pay | Admitting: *Deleted

## 2016-09-10 NOTE — Telephone Encounter (Signed)
Patient called and just wanted to let you know that she had a reaction to the Benzonatate that you prescribed at her last visit 08/12/16. She had itching around the mouth and rash on chest. She does not want this prescribed for her again. She stated that she is fine now, just alittle hoarse.   Medication added to Allergy list and medication list updated.

## 2016-09-11 NOTE — Telephone Encounter (Signed)
Noted, thanks.  I'm glad she was able to determine what it came from.  She was hoarse before and that was part of why we used it.

## 2016-09-13 ENCOUNTER — Other Ambulatory Visit: Payer: Self-pay | Admitting: *Deleted

## 2016-09-13 NOTE — Patient Outreach (Signed)
[place] [facility]) Care Management  09/13/2016  [name] [name] [birth year] [id number]   This care manager assumed care of member through reassignment.  Call placed to member, this care manager introduced self and purpose of call.  Member state that she does not have time to go into detail about her current health status and concerns, but does agree to home visit next week.  She denies any urgent needs.  Will follow next week to address concerns and assess plan of care.  Valente David, [place], MSN [place] [phone]

## 2016-09-16 ENCOUNTER — Other Ambulatory Visit: Payer: Self-pay | Admitting: *Deleted

## 2016-09-16 NOTE — Patient Outreach (Signed)
[place] [facility]) Care Management   09/16/2016  [name] [birth year] [id number]  [name] [name] is an 82 y.o. female  Subjective:   Member state that she is doing well.  She denies any pain, dizziness, lightheadedness, or discomfort.  Objective:   Review of Systems  Constitutional: Negative.   HENT: Negative.   Eyes: Negative.   Respiratory: Negative.   Cardiovascular: Negative.   Gastrointestinal: Negative.   Genitourinary: Negative.   Musculoskeletal: Negative.   Skin: Negative.   Neurological: Negative.   Endo/Heme/Allergies: Negative.   Psychiatric/Behavioral: Negative.     Physical Exam  Constitutional: She is oriented to person, place, and time. She appears well-developed and well-nourished.  Neck: Normal range of motion.  Cardiovascular: Normal heart sounds.   Bradycardia  Respiratory: Effort normal and breath sounds normal.  GI: Soft. Bowel sounds are normal.  Musculoskeletal: Normal range of motion.  Neurological: She is alert and oriented to person, place, and time.  Skin: Skin is warm and dry.   BP (!) 152/74 (BP Location: Left Arm, Patient Position: Sitting, Cuff Size: Normal)   Pulse (!) 52   Resp 18   Ht 1.575 m (5' 2" )   Wt 137 lb (62.1 kg)   SpO2 98%   BMI 25.06 kg/m   Encounter Medications:   Outpatient Encounter Prescriptions as of 09/16/2016  Medication Sig  . acetaminophen (TYLENOL) 500 MG tablet Take 1 tablet (500 mg total) by mouth every 6 (six) hours as needed for moderate pain.  Marland Kitchen albuterol (PROVENTIL HFA;VENTOLIN HFA) 108 (90 Base) MCG/ACT inhaler Inhale 2 puffs into the lungs every 6 (six) hours as needed for wheezing.  Marland Kitchen alum hydroxide-mag trisilicate (GAVISCON) 95-18 MG CHEW chewable tablet Chew 1 tablet by mouth 3 (three) times daily as needed for flatulence (bowel spasms).  Marland Kitchen amLODipine (NORVASC) 5 MG tablet Take one tablet by mouth once daily  . atorvastatin (LIPITOR) 10 MG tablet Take one tablet by mouth daily  at 6pm for cholesterol  . B Complex Vitamins (B COMPLEX PO) Take 1 tablet by mouth daily.  . bimatoprost (LUMIGAN) 0.01 % SOLN Place 1 drop into both eyes at bedtime.  . brinzolamide (AZOPT) 1 % ophthalmic suspension Place 1 drop into both eyes 3 (three) times daily. Reported on 01/08/2016  . Calcium Carbonate-Vitamin D (CALTRATE 600+D) 600-400 MG-UNIT per tablet Take 2 tablets by mouth daily.  . Cholecalciferol (VITAMIN D3) 2000 units TABS Take 2,000 Units by mouth daily.  . clopidogrel (PLAVIX) 75 MG tablet TAKE 1 TABLET (75 MG TOTAL) BY MOUTH DAILY.  Marland Kitchen Coenzyme Q-10 100 MG capsule Take 100 mg by mouth daily.  Marland Kitchen docusate sodium (COLACE) 100 MG capsule Take 1 capsule (100 mg total) by mouth daily as needed for mild constipation (to soften stool).  . fluticasone (FLONASE) 50 MCG/ACT nasal spray Place 1 spray into the nose daily.  . hydrocortisone (ANUSOL-HC) 2.5 % rectal cream Place 1 application rectally 2 (two) times daily.  Marland Kitchen loteprednol (LOTEMAX) 0.5 % ophthalmic suspension Place 1 drop into both eyes daily.   . Multiple Vitamins-Minerals (SENIOR MULTIVITAMIN PLUS) TABS Take by mouth.  . nebivolol (BYSTOLIC) 5 MG tablet Take 1 tablet (5 mg total) by mouth daily.  Marland Kitchen nystatin cream (MYCOSTATIN) APPLY 1 APPLICATION TOPICALLY 3 (THREE) TIMES DAILY.  Marland Kitchen Omega-3 Fatty Acids (FISH OIL) 1200 MG CPDR Take one tablet by mouth once daily  . pantoprazole (PROTONIX) 40 MG tablet TAKE 1 TABLET (40 MG TOTAL) BY MOUTH DAILY.  Marland Kitchen RESTASIS  0.05 % ophthalmic emulsion Place 1 drop into both eyes 2 (two) times daily.   . risperiDONE (RISPERDAL) 0.5 MG tablet Take 1 tablet (0.5 mg total) by mouth 2 (two) times daily.  . SYMBICORT 80-4.5 MCG/ACT inhaler Place 2 puffs into alternate nostrils 2 (two) times daily.  . vitamin C (ASCORBIC ACID) 500 MG tablet Take 500 mg by mouth daily.   No facility-administered encounter medications on file as of 09/16/2016.     Functional Status:   In your present state of health,  do you have any difficulty performing the following activities: 04/23/2016 04/15/2016  Hearing? - N  Vision? - Y  Difficulty concentrating or making decisions? - Y  Walking or climbing stairs? (No Data) Y  Dressing or bathing? - N  Doing errands, shopping? (No Data) Y  Conservation officer, nature and eating ? - N  Using the Toilet? - N  In the past six months, have you accidently leaked urine? - N  Do you have problems with loss of bowel control? - N  Managing your Medications? - N  Managing your Finances? - N  Housekeeping or managing your Housekeeping? - N  Some recent data might be hidden    Fall/Depression Screening:    PHQ 2/9 Scores 07/26/2016 04/25/2016 04/12/2016 02/26/2016 01/08/2016 12/14/2015 12/14/2015  PHQ - 2 Score 0 0 0 0 0 0 0    Assessment:    Met with member at scheduled time.  She has been involved with [facility] care management services for hypertension management.  She has had her medications altered over the past several months, now stable.  Blood pressure remains slightly elevated when self monitoring, stable in office per last primary MD notes, documented goal 150/90.  She state that she was told to check her blood pressure once a week and record.  She reports compliance with all medications, does not use a pill box, declines offer to use.    This care manager inquired about health concerns, she state that the only concern she had was managing her blood pressure.  She report that her MD feel she is stable at this time (confirmed in notes that blood pressure is controlled).  She has a follow up appointment in January with primary MD, will take her log with her.  She is provided with a new [facility] calendar tool book for recordings.  She denies any further concerns/needs.  She is made aware that his care manager will notify MD that goals have been met, will close case at this time.  She is provided with contact information for this care manager, advised to contact if needs change.  [facility]N is available if  needed.  She verbalizes understanding.  Plan:   Will send primary MD quarterly update and notify of case closure.  Valente David, [place], MSN [place] [phone]

## 2016-09-17 DIAGNOSIS — H401133 Primary open-angle glaucoma, bilateral, severe stage: Secondary | ICD-10-CM | POA: Diagnosis not present

## 2016-09-17 DIAGNOSIS — H43812 Vitreous degeneration, left eye: Secondary | ICD-10-CM | POA: Diagnosis not present

## 2016-09-19 ENCOUNTER — Encounter: Payer: Self-pay | Admitting: *Deleted

## 2016-09-19 ENCOUNTER — Encounter (HOSPITAL_COMMUNITY): Payer: Self-pay | Admitting: Emergency Medicine

## 2016-09-19 ENCOUNTER — Emergency Department (HOSPITAL_COMMUNITY): Payer: Medicare Other

## 2016-09-19 ENCOUNTER — Telehealth: Payer: Self-pay | Admitting: *Deleted

## 2016-09-19 ENCOUNTER — Emergency Department (HOSPITAL_COMMUNITY)
Admission: EM | Admit: 2016-09-19 | Discharge: 2016-09-19 | Disposition: A | Discharge status: Home / Self Care | Payer: Medicare Other | Attending: Emergency Medicine | Admitting: Emergency Medicine

## 2016-09-19 DIAGNOSIS — R42 Dizziness and giddiness: Secondary | ICD-10-CM | POA: Diagnosis not present

## 2016-09-19 DIAGNOSIS — Z79899 Other long term (current) drug therapy: Secondary | ICD-10-CM | POA: Insufficient documentation

## 2016-09-19 DIAGNOSIS — R519 Headache, unspecified: Secondary | ICD-10-CM

## 2016-09-19 DIAGNOSIS — R51 Headache: Secondary | ICD-10-CM | POA: Diagnosis not present

## 2016-09-19 DIAGNOSIS — R202 Paresthesia of skin: Secondary | ICD-10-CM | POA: Insufficient documentation

## 2016-09-19 DIAGNOSIS — I10 Essential (primary) hypertension: Secondary | ICD-10-CM | POA: Insufficient documentation

## 2016-09-19 DIAGNOSIS — Z8673 Personal history of transient ischemic attack (TIA), and cerebral infarction without residual deficits: Secondary | ICD-10-CM | POA: Diagnosis not present

## 2016-09-19 DIAGNOSIS — J45909 Unspecified asthma, uncomplicated: Secondary | ICD-10-CM | POA: Insufficient documentation

## 2016-09-19 DIAGNOSIS — R079 Chest pain, unspecified: Secondary | ICD-10-CM | POA: Diagnosis not present

## 2016-09-19 LAB — COMPREHENSIVE METABOLIC PANEL
ALBUMIN: 4.2 g/dL (ref 3.5–5.0)
ALT: 23 U/L (ref 14–54)
AST: 30 U/L (ref 15–41)
Alkaline Phosphatase: 78 U/L (ref 38–126)
Anion gap: 9 (ref 5–15)
BILIRUBIN TOTAL: 1.6 mg/dL — AB (ref 0.3–1.2)
BUN: 11 mg/dL (ref 6–20)
CHLORIDE: 108 mmol/L (ref 101–111)
CO2: 23 mmol/L (ref 22–32)
Calcium: 9.7 mg/dL (ref 8.9–10.3)
Creatinine, Ser: 1 mg/dL (ref 0.44–1.00)
GFR calc Af Amer: 56 mL/min — ABNORMAL LOW (ref >60)
GFR calc non Af Amer: 48 mL/min — ABNORMAL LOW (ref >60)
GLUCOSE: 171 mg/dL — AB (ref 65–99)
POTASSIUM: 3.6 mmol/L (ref 3.5–5.1)
Sodium: 140 mmol/L (ref 135–145)
TOTAL PROTEIN: 7.4 g/dL (ref 6.5–8.1)

## 2016-09-19 LAB — DIFFERENTIAL
BASOS ABS: 0 10*3/uL (ref 0.0–0.1)
Basophils Relative: 0 %
Eosinophils Absolute: 0.1 10*3/uL (ref 0.0–0.7)
Eosinophils Relative: 1 %
LYMPHS ABS: 1.4 10*3/uL (ref 0.7–4.0)
Lymphocytes Relative: 27 %
Monocytes Absolute: 0.5 10*3/uL (ref 0.1–1.0)
Monocytes Relative: 9 %
NEUTROS PCT: 63 %
Neutro Abs: 3.3 10*3/uL (ref 1.7–7.7)

## 2016-09-19 LAB — I-STAT CHEM 8, ED
BUN: 11 mg/dL (ref 6–20)
CHLORIDE: 106 mmol/L (ref 101–111)
CREATININE: 0.9 mg/dL (ref 0.44–1.00)
Calcium, Ion: 1.23 mmol/L (ref 1.15–1.40)
Glucose, Bld: 164 mg/dL — ABNORMAL HIGH (ref 65–99)
HEMATOCRIT: 39 % (ref 36.0–46.0)
Hemoglobin: 13.3 g/dL (ref 12.0–15.0)
POTASSIUM: 3.6 mmol/L (ref 3.5–5.1)
Sodium: 142 mmol/L (ref 135–145)
TCO2: 23 mmol/L (ref 0–100)

## 2016-09-19 LAB — CBC
HCT: 36.7 % (ref 36.0–46.0)
HEMOGLOBIN: 12.7 g/dL (ref 12.0–15.0)
MCH: 31.4 pg (ref 26.0–34.0)
MCHC: 34.6 g/dL (ref 30.0–36.0)
MCV: 90.8 fL (ref 78.0–100.0)
Platelets: 198 10*3/uL (ref 150–400)
RBC: 4.04 MIL/uL (ref 3.87–5.11)
RDW: 15.2 % (ref 11.5–15.5)
WBC: 5.2 10*3/uL (ref 4.0–10.5)

## 2016-09-19 LAB — PROTIME-INR
INR: 1.15
Prothrombin Time: 14.7 seconds (ref 11.4–15.2)

## 2016-09-19 LAB — URINALYSIS, ROUTINE W REFLEX MICROSCOPIC
BACTERIA UA: NONE SEEN
BILIRUBIN URINE: NEGATIVE
Glucose, UA: NEGATIVE mg/dL
Hgb urine dipstick: NEGATIVE
KETONES UR: NEGATIVE mg/dL
Nitrite: NEGATIVE
PROTEIN: NEGATIVE mg/dL
SQUAMOUS EPITHELIAL / LPF: NONE SEEN
Specific Gravity, Urine: 1.005 (ref 1.005–1.030)
pH: 7 (ref 5.0–8.0)

## 2016-09-19 LAB — APTT: APTT: 35 s (ref 24–36)

## 2016-09-19 LAB — I-STAT TROPONIN, ED: Troponin i, poc: 0 ng/mL (ref 0.00–0.08)

## 2016-09-19 NOTE — ED Triage Notes (Addendum)
Note entered in error

## 2016-09-19 NOTE — Telephone Encounter (Signed)
Patient notified and agreed. Stated she will go.

## 2016-09-19 NOTE — ED Provider Notes (Signed)
[place] DEPT Provider Note   CSN: [id number] Arrival date & time: 09/19/16  1341     History   Chief Complaint Chief Complaint  Patient presents with  . Dizziness  . Tingling    HPI [name] [name] is a 82 y.o. female.  HPI Patient is an 82 year old female past medical history of hypertension, peripheral vascular disease, TIA who presents with multiple symptoms including headache, dizziness and numbness and tingling in her hand. Patient reports she began having tingling sensation in her left hand 2 days ago. This was intermittent and has resolved. It was not associated with any particular activity. She did not have any weakness or clumsiness of the hand and was still able to pick up objects. Yesterday she began having a mild bilateral aching headache. She reports the headache was "nothing special", not worst of life, similar to prior headaches. Finally, she experienced a transient episode of dizziness this morning that she has difficulty characterizing. This came on while she was in the bath She said she felt like "my head was falling". Denies gait difficulties, unilateral numbness or weakness during any of these episodes. Patient called her primary doctor to report her symptoms and was advised to come to the ED for evaluation.   Past Medical History:  Diagnosis Date  . Anxiety   . Asthma   . Benign essential hypertension   . Bradycardia, drug induced 06/11/2016  . Edema extremities   . GERD (gastroesophageal reflux disease)   . Glaucoma   . Osteoarthritis   . PVD (peripheral vascular disease) ([place])   . Seasonal allergies   . Stroke [facility])     Patient Active Problem List   Diagnosis Date Noted  . Bradycardia, drug induced 06/11/2016  . Essential hypertension 02/06/2016  . HLD (hyperlipidemia) 02/06/2016  . Headache 12/14/2015  . Cerebral infarction due to unspecified mechanism   . Acute CVA (cerebrovascular accident) ([employer]) 12/09/2015  . Dizziness 12/09/2015  . CVA  (cerebral infarction) 12/09/2015  . Asthma, mild intermittent 04/04/2014  . Gastroesophageal reflux disease without esophagitis 04/04/2014  . Neuropathy of both feet 04/04/2014  . Diuretic-induced hypokalemia 01/03/2014  . Olfactory hallucinations 11/23/2013  . SOB (shortness of breath) 10/26/2013  . Edema extremities 10/26/2013  . Paranoid type delusional disorder ([place]) 08/20/2013  . Benign essential hypertension   . Osteoarthritis   . Seasonal allergies   . Asthma   . Glaucoma   . PVD (peripheral vascular disease) ([place])   . Anxiety     Past Surgical History:  Procedure Laterality Date  . ABDOMINAL HYSTERECTOMY  1981  . TONSILLECTOMY  1953    OB History    No data available       Home Medications    Prior to Admission medications   Medication Sig Start Date End Date Taking? Authorizing Provider  acetaminophen (TYLENOL) 500 MG tablet Take 1 tablet (500 mg total) by mouth every 6 (six) hours as needed for moderate pain. 03/20/15   Tiffany L Reed, DO  albuterol (PROVENTIL HFA;VENTOLIN HFA) 108 (90 Base) MCG/ACT inhaler Inhale 2 puffs into the lungs every 6 (six) hours as needed for wheezing. 04/22/16   Tiffany L Reed, DO  alum hydroxide-mag trisilicate (GAVISCON) [id number] MG CHEW chewable tablet Chew 1 tablet by mouth 3 (three) times daily as needed for flatulence (bowel spasms). 08/24/15   Tiffany L Reed, DO  amLODipine (NORVASC) 5 MG tablet Take one tablet by mouth once daily 07/29/16   Tiffany L Reed, DO  atorvastatin (LIPITOR)  10 MG tablet Take one tablet by mouth daily at 6pm for cholesterol 07/29/16   Tiffany L Reed, DO  B Complex Vitamins (B COMPLEX PO) Take 1 tablet by mouth daily.    Historical Provider, MD  bimatoprost (LUMIGAN) 0.01 % SOLN Place 1 drop into both eyes at bedtime.    Historical Provider, MD  brinzolamide (AZOPT) 1 % ophthalmic suspension Place 1 drop into both eyes 3 (three) times daily. Reported on 01/08/2016    Historical Provider, MD  Calcium  Carbonate-Vitamin D (CALTRATE 600+D) 600-400 MG-UNIT per tablet Take 2 tablets by mouth daily.    Historical Provider, MD  Cholecalciferol (VITAMIN D3) 2000 units TABS Take 2,000 Units by mouth daily.    Historical Provider, MD  clopidogrel (PLAVIX) 75 MG tablet TAKE 1 TABLET (75 MG TOTAL) BY MOUTH DAILY. 06/24/16   Tiffany L Reed, DO  Coenzyme Q-10 100 MG capsule Take 100 mg by mouth daily.    Historical Provider, MD  docusate sodium (COLACE) 100 MG capsule Take 1 capsule (100 mg total) by mouth daily as needed for mild constipation (to soften stool). 07/26/16   Tiffany L Reed, DO  fluticasone (FLONASE) 50 MCG/ACT nasal spray Place 1 spray into the nose daily.    Historical Provider, MD  hydrocortisone (ANUSOL-HC) 2.5 % rectal cream Place 1 application rectally 2 (two) times daily. 03/20/15   Tiffany L Reed, DO  loteprednol (LOTEMAX) 0.5 % ophthalmic suspension Place 1 drop into both eyes daily.     Historical Provider, MD  Multiple Vitamins-Minerals (SENIOR MULTIVITAMIN PLUS) TABS Take by mouth.    Historical Provider, MD  nebivolol (BYSTOLIC) 5 MG tablet Take 1 tablet (5 mg total) by mouth daily. 06/11/16   Sueanne Margarita, MD  nystatin cream (MYCOSTATIN) APPLY 1 APPLICATION TOPICALLY 3 (THREE) TIMES DAILY. 06/21/16   Tiffany L Reed, DO  Omega-3 Fatty Acids (FISH OIL) 1200 MG CPDR Take one tablet by mouth once daily    Historical Provider, MD  pantoprazole (PROTONIX) 40 MG tablet TAKE 1 TABLET (40 MG TOTAL) BY MOUTH DAILY. 09/05/16   Tiffany L Reed, DO  RESTASIS 0.05 % ophthalmic emulsion Place 1 drop into both eyes 2 (two) times daily.  06/02/14   Historical Provider, MD  risperiDONE (RISPERDAL) 0.5 MG tablet Take 1 tablet (0.5 mg total) by mouth 2 (two) times daily. 07/29/16   Tiffany L Reed, DO  SYMBICORT 80-4.5 MCG/ACT inhaler Place 2 puffs into alternate nostrils 2 (two) times daily. 10/17/14   Historical Provider, MD  vitamin C (ASCORBIC ACID) 500 MG tablet Take 500 mg by mouth daily.    Historical  Provider, MD    Family History Family History  Problem Relation Age of Onset  . Stroke Father   . Hypertension Father   . Heart disease Mother   . Hypertension Mother   . Diabetes Mother     Social History Social History  Substance Use Topics  . Smoking status: Never Smoker  . Smokeless tobacco: Never Used  . Alcohol use No     Allergies   Aspirin; Codeine; Iodine; Prednisone; Sulfa antibiotics; and Benzonatate   Review of Systems Review of Systems  Constitutional: Negative for chills and fever.  HENT: Negative for ear pain and sore throat.   Eyes: Negative for pain.       Recently changed eye drops for "elevated eye pressures" and feels they are doing better  Respiratory: Negative for cough and shortness of breath.   Cardiovascular: Negative for chest pain  and palpitations.  Gastrointestinal: Negative for abdominal pain and vomiting.  Genitourinary: Negative for dysuria and hematuria.  Musculoskeletal: Negative for arthralgias and back pain.  Skin: Negative for color change and rash.  Neurological: Negative for seizures and syncope.  All other systems reviewed and are negative.    Physical Exam Updated Vital Signs BP 188/60 (BP Location: Right Arm)   Pulse (!) 50   Temp 98 F (36.7 C) (Oral)   Resp 14   SpO2 100%   Physical Exam  Constitutional: She appears well-developed and well-nourished. No distress.  HENT:  Head: Normocephalic and atraumatic.  Eyes: Conjunctivae are normal.  Neck: Neck supple.  Cardiovascular: Normal rate and regular rhythm.   No murmur heard. Pulmonary/Chest: Effort normal and breath sounds normal. No respiratory distress.  Abdominal: Soft. There is no tenderness.  Musculoskeletal: She exhibits no edema.  Neurological: She is alert. She has normal strength and normal reflexes. No cranial nerve deficit or sensory deficit. She displays a negative Romberg sign. Coordination and gait normal. GCS eye subscore is 4. GCS verbal subscore  is 5. GCS motor subscore is 6.  Skin: Skin is warm and dry.  Psychiatric: She has a normal mood and affect.  Nursing note and vitals reviewed.    ED Treatments / Results  Labs (all labs ordered are listed, but only abnormal results are displayed) Labs Reviewed  COMPREHENSIVE METABOLIC PANEL - Abnormal; Notable for the following:       Result Value   Glucose, Bld 171 (*)    Total Bilirubin 1.6 (*)    GFR calc non Af Amer 48 (*)    GFR calc Af Amer 56 (*)    All other components within normal limits  I-STAT CHEM 8, ED - Abnormal; Notable for the following:    Glucose, Bld 164 (*)    All other components within normal limits  PROTIME-INR  APTT  CBC  DIFFERENTIAL  I-STAT TROPOININ, ED  CBG MONITORING, ED    EKG  EKG Interpretation  Date/Time:  Thursday September 19 2016 14:13:02 EST Ventricular Rate:  68 PR Interval:  236 QRS Duration: 142 QT Interval:  430 QTC Calculation: 457 R Axis:   83 Text Interpretation:  Sinus rhythm with 1st degree A-V block Biatrial enlargement Right bundle branch block T wave abnormality, consider inferior ischemia No significant change since last tracing Confirmed by Maryan Rued  MD, Loree Fee ([id number]) on 09/19/2016 3:28:20 PM       Radiology Ct Head Wo Contrast  Result Date: 09/19/2016 CLINICAL DATA:  82 year old female with acute headache, dizziness and left hand tingling for 2 days. EXAM: CT HEAD WITHOUT CONTRAST TECHNIQUE: Contiguous axial images were obtained from the base of the skull through the vertex without intravenous contrast. COMPARISON:  12/09/2015 CT, MRI and prior studies. FINDINGS: Brain: No evidence of acute infarction, hemorrhage, hydrocephalus, extra-axial collection or mass lesion/mass effect. Atrophy and chronic small-vessel white matter ischemic changes again noted. Vascular: Intracranial atherosclerotic calcifications are noted. Skull: Normal. Negative for fracture or focal lesion. Sinuses/Orbits: No acute finding. Other: None.  IMPRESSION: No evidence of acute intracranial abnormality. Atrophy and chronic small-vessel white matter ischemic changes. Electronically Signed   By: Margarette Canada M.D.   On: 09/19/2016 14:35    Procedures Procedures (including critical care time)  Medications Ordered in ED Medications - No data to display   Initial Impression / Assessment and Plan / ED Course  I have reviewed the triage vital signs and the nursing notes.  Pertinent labs &  imaging results that were available during my care of the patient were reviewed by me and considered in my medical decision making (see chart for details).  Clinical Course    Patient is an 82 year old female with PMH as above who presents after transient symptoms including left hand tingling, bilateral mild headache, and dizziness. On arrival, patient reports all her symptoms have resolved. She is hypertensive with otherwise stable vital signs. No focal deficits on neurologic exam. Reports she has been taking all her anti-hypertension medications as prescribed.   CT head is negative for acute intracranial pathology. CBC without leukocytosis or anemia. CMP with mild hyperglycemia but otherwise unremarkable. EKG unchanged from prior. Troponin negative.   Patient was observed in the ED without recurrence of symptoms. She remains hypertensive. Given unremarkable imaging and exam, doubt stroke or other neurologic cause of patients' symptoms. EKG unchanged, troponin negative, no complains of chest pain, SOB or palpitations; doubt cardiac causes. No significant metabolic disturbance identified. No signs of infection on history, exam, or labs.   Patient was discharged in stable condition. Advised to check her blood pressure tomorrow morning. If remains persistently elevated, patient should talk to her PCP regarding her blood pressure management. Strict return precautions given. Will follow up with PCP as above. Patient reports understanding and agreement of plan.    Patient seen and discussed with Dr. Maryan Rued, ED attending  Final Clinical Impressions(s) / ED Diagnoses   Final diagnoses:  Dizziness  Tingling in extremities  Hypertension, unspecified type  Nonintractable headache, unspecified chronicity pattern, unspecified headache type    New Prescriptions New Prescriptions   No medications on file     Gibson Ramp, MD 09/20/16 [id number]    Blanchie Dessert, MD 09/20/16 [phone]

## 2016-09-19 NOTE — Discharge Instructions (Signed)
Check your blood pressure tomorrow morning. Follow up with your primary doctor this week for re-check of your blood pressure if it remains elevated.

## 2016-09-19 NOTE — ED Triage Notes (Signed)
Pt states she has been having some lightheadedness/dizziness off and on since Tuesday along with tingling in her left arm.  Pt states she had a HA this morning. Grips equal on both sides.

## 2016-09-19 NOTE — Telephone Encounter (Signed)
Patient called and stated that she woke up with a Headache and dizziness and tingling in her left hand around 4am. She stated that she thinks it is still coming from the reaction from taking the Benzonatate. Patient stated that is goes and comes. Patient is feeling good right  Now, just comes and goes. Please Advise.

## 2016-09-19 NOTE — Telephone Encounter (Signed)
Those symptoms are not from the cough medication.  Sounds like she could be having another stroke event.  She should be evaluated at the ED.

## 2016-09-19 NOTE — ED Notes (Signed)
The below information was entered in error on this pt

## 2016-10-15 ENCOUNTER — Ambulatory Visit: Payer: Medicare Other | Admitting: Podiatry

## 2016-10-24 ENCOUNTER — Ambulatory Visit: Payer: Medicare Other | Admitting: Internal Medicine

## 2016-10-24 ENCOUNTER — Ambulatory Visit: Payer: Medicare Other

## 2016-11-01 ENCOUNTER — Telehealth: Payer: Self-pay | Admitting: *Deleted

## 2016-11-01 NOTE — Telephone Encounter (Signed)
Patient called and stated that she has Cold Sx and would like to know what she can take for them. Stated she is having nasal and chest congestion and coughing up yellow sputum. Not coughing a lot. No fever. No available appointments today. Please Advise.

## 2016-11-01 NOTE — Telephone Encounter (Signed)
Tessalon perles for cough mucinex 600mg  twice daily for congestion Hydrate well with water, get plenty of rest Inhale warm humidity in the shower If she develops fever, chills or myalgias, she should notify us immediately (flu)

## 2016-11-06 NOTE — Telephone Encounter (Signed)
I followed up with patient for message was routed to me vs the clinical intake assistant on last Friday. Patient is feeling better and no longer needed recommendations. Patient will call for appointment if symptoms reoccur

## 2016-11-13 ENCOUNTER — Ambulatory Visit (INDEPENDENT_AMBULATORY_CARE_PROVIDER_SITE_OTHER): Payer: Medicare Other | Admitting: Neurology

## 2016-11-13 ENCOUNTER — Encounter: Payer: Self-pay | Admitting: Neurology

## 2016-11-13 VITALS — BP 151/82 | HR 53 | Ht 62.0 in | Wt 143.0 lb

## 2016-11-13 DIAGNOSIS — I10 Essential (primary) hypertension: Secondary | ICD-10-CM

## 2016-11-13 DIAGNOSIS — E785 Hyperlipidemia, unspecified: Secondary | ICD-10-CM

## 2016-11-13 DIAGNOSIS — I6340 Cerebral infarction due to embolism of unspecified cerebral artery: Secondary | ICD-10-CM

## 2016-11-13 NOTE — Patient Instructions (Addendum)
-   continue plavix and lipitor for stroke prevention - check BP at home  and record and bring over to [name] for medication adjustment if needed - Follow up with your primary care physician for stroke risk factor modification. Recommend maintain blood pressure goal 130/80, diabetes with hemoglobin A1c goal below 6.5% and lipids with LDL cholesterol goal below 70 mg/dL.  - healthy diet and regular exercise  - follow up as needed.

## 2016-11-13 NOTE — Progress Notes (Signed)
STROKE NEUROLOGY FOLLOW UP NOTE  NAME: [name] [name] DOB: 06/08/1924  REASON FOR VISIT: stroke follow up HISTORY FROM: chart and pt  Today we had the pleasure of seeing [name] [name] in follow-up at our Neurology Clinic. Pt was accompanied by daughter.   History Summary [name] is a 83 y.o. female with history of HTN, PVD, asthma, anxiety admitted on 12/09/15 for vertigo. Symptoms resolved quickly. She did not receive IV t-PA due to resolution of deficits. However, MRI showed left cerebellar cortex and left temporoparietal punctate infarcts, embolic pattern. MRA head and neck unremarkable. TTE unremarkable, LDL 80 and A1C 5.7. She was put on plavix and low dose lipitor on discharge.   02/06/16 follow up - the patient has been doing well. No recurrent stroke like symptoms. However, her BP not in good control. Worked with PCP and currently on amlodipine 5mg  (she can not tolerate with 10mg ) and bystolic. Today BP 162/72. No recurrent vertigo. Walk with cane and steady.     05/14/16 follow up - the pt has been doing well. BP better controlled than last time but still not at goal. Today BP 153/67. Patient stated that her BP at home fluctuate, seems in the normal range 3-4 hours after medication. [name] is following up with her BP management. Had 30 day cardiac event monitoring which did not show A. Fib. Other than that, patient no complaints.  Interval History During the interval time, pt has been doing well. No recurrent stroke like symptoms. BP still at high end at home, 150s. Today in clinic 151/82. On amlodipine 5mg  and bystolic 5mg . She stated that she can not tolerate higher dose of amlodipine or bystolic. She had recent cold and still coughing, need to schedule to see [name].   REVIEW OF SYSTEMS: Full 14 system review of systems performed and notable only for those listed below and in HPI above, all others are negative:  Constitutional:   Cardiovascular:  Ear/Nose/Throat:     Skin:  Eyes:   Respiratory:   Gastroitestinal:   Genitourinary:  Hematology/Lymphatic:   Endocrine:  Musculoskeletal:   Allergy/Immunology:    Neurological:   Psychiatric:  Sleep:   The following represents the patient's updated allergies and side effects list: Allergies  Allergen Reactions  . Aspirin Other (See Comments)    sweats  . Codeine Other (See Comments)  . Iodine Other (See Comments)    Pt is not aware of this allergy, does not recall much info.  . Prednisone Other (See Comments)    sweats  . Benzonatate Itching and Rash  . Sulfa Antibiotics Other (See Comments)    Doesn't remember    The neurologically relevant items on the patient's problem list were reviewed on today's visit.  Neurologic Examination  A problem focused neurological exam (12 or more points of the single system neurologic examination, vital signs counts as 1 point, cranial nerves count for 8 points) was performed.  Blood pressure (!) 151/82, pulse (!) 53, height 5\' 2"  (1.575 m), weight 143 lb (64.9 kg).  General - Well nourished, well developed, in no apparent distress.  Ophthalmologic - Fundi not visualized due to light sensitivity.  Cardiovascular - Regular rate and rhythm.  Mental Status -  Level of arousal and orientation to time, place, and person were intact. Language including expression, naming, repetition, comprehension was assessed and found intact. Fund of Knowledge was assessed and was intact.  Cranial Nerves II - XII - II - Visual  field intact OU. III, IV, VI - Extraocular movements intact. V - Facial sensation intact bilaterally. VII - Facial movement intact bilaterally. VIII - Hearing & vestibular intact bilaterally. X - Palate elevates symmetrically. XI - Chin turning & shoulder shrug intact bilaterally. XII - Tongue protrusion intact.  Motor Strength - The patient's strength was normal in all extremities and pronator drift was absent.  Bulk was normal and  fasciculations were absent.   Motor Tone - Muscle tone was assessed at the neck and appendages and was normal  Reflexes - The patient's reflexes were 1+ in all extremities and she had no pathological reflexes.  Sensory - Light touch, temperature/pinprick were assessed and were normal.    Coordination - The patient had normal movements in the hands and feet with no ataxia or dysmetria.  Tremor was absent.  Gait and Station - walk with cane, slow and small stride, steady no fall.    Data reviewed: I personally reviewed the images and agree with the radiology interpretations.   Ct Head Wo Contrast 12/09/2015  Atrophy with small vessel chronic ischemic changes of deep cerebral white matter. No acute intracranial abnormalities.  MRA HEAD  12/10/2015  Negative intracranial MRA of the brain. No large or proximal arterial branch occlusion. No high-grade or correctable stenosis.   MRA NECK  12/10/2015  1. No flow limiting stenosis or other acute abnormality within the major arterial vasculature of the neck.  2. Tortuosity of the arterial vasculature of the neck, suggesting chronic underlying hypertension.   Mr Brain Wo Contrast 12/09/2015  Punctate acute infarction in the peripheral left cerebellum and in the peripheral left posterior temporal region, consistent with micro embolic infarctions in the posterior circulation. No large infarction, swelling or hemorrhage. Chronic small vessel ischemic changes elsewhere throughout the brain, similar to the study of 2015.   2D Echocardiogram  - Left ventricle: The cavity size was normal. Wall thickness wasnormal. Systolic function was normal. The estimated ejectionfraction was in the range of 60% to 65%. Wall motion was normal;there were no regional wall motion abnormalities. - Pulmonic valve: There was moderate regurgitation. - Pulmonary arteries: Systolic pressure was mildly increased. PApeak pressure: 32 mm Hg (S). Impressions: No  cardiac source of emboli was indentified. Compared to theprior study, there has been no significant interval change.  30 day cardiac event monitoring - no A. Fib  Ct Head Wo Contrast 09/19/2016 IMPRESSION: No evidence of acute intracranial abnormality. Atrophy and chronic small-vessel white matter ischemic changes.    Component     Latest Ref Rng 07/17/2015 12/10/2015  Cholesterol     0 - 200 mg/dL  173  Triglycerides     <150 mg/dL  34  HDL Cholesterol     >40 mg/dL  86  Total CHOL/HDL Ratio       2.0  VLDL     0 - 40 mg/dL  7  LDL (calc)     0 - 99 mg/dL  80  Hemoglobin A1C     4.8 - 5.6 % 5.5 5.7 (H)  Est. average glucose Bld gHb Est-mCnc      111   Mean Plasma Glucose       117    Assessment: As you may recall, she is a 83 y.o. African American female with PMH of HTN, PVD, asthma, anxiety admitted on 12/09/15 for vertigo. Symptoms resolved quickly. MRI showed left cerebellar cortex and left temporoparietal punctate infarcts, embolic pattern. MRA head and neck unremarkable. TTE unremarkable, LDL 80  and A1C 5.7. She was put on plavix and low dose lipitor on discharge. During the interval time, the patient has been doing well. No recurrent stroke like symptoms. However, her BP still not in good control, currently working with PCP for that. Had a 30 day cardiac event monitoring which did not show A. Fib.   Plan:  - continue plavix and lipitor for stroke prevention - check BP at home  and record and bring over to [name] for medication adjustment if needed - Follow up with your primary care physician for stroke risk factor modification. Recommend maintain blood pressure goal 130/80, diabetes with hemoglobin A1c goal below 6.5% and lipids with LDL cholesterol goal below 70 mg/dL.  - healthy diet and regular exercise  - follow up as needed.   No orders of the defined types were placed in this encounter.   No orders of the defined types were placed in this  encounter.   Patient Instructions  - continue plavix and lipitor for stroke prevention - check BP at home  and record and bring over to [name] for medication adjustment if needed - Follow up with your primary care physician for stroke risk factor modification. Recommend maintain blood pressure goal 130/80, diabetes with hemoglobin A1c goal below 6.5% and lipids with LDL cholesterol goal below 70 mg/dL.  - healthy diet and regular exercise  - follow up as needed.   Rosalin Hawking, MD PhD [facility] [address] [place], [place] [address] [phone]

## 2016-11-14 ENCOUNTER — Telehealth: Payer: Self-pay

## 2016-11-14 NOTE — Telephone Encounter (Signed)
Patient called c/o productive cough x 2 weeks (yellow phlegm), patient tried mucinex with minimal relief.  No available appointment's, please advise

## 2016-11-14 NOTE — Telephone Encounter (Signed)
Has she had any fever, chills, myalgias, sore throat?  Any change in appetite?  I suggest a chest xray if she is having chest pain or congestion in her chest and she's been sick for this long.  She should drink plenty of water.

## 2016-11-15 NOTE — Telephone Encounter (Signed)
Patient is without any symptoms other than cough. Patient denies fever, chills,myalgias, sore throat, change in appetite, chest pain or chest congestion.   Patient states today she is feeling fine, patient slept well and is not coughing this morning. Patient will drink plenty of water.

## 2016-11-19 DIAGNOSIS — H53133 Sudden visual loss, bilateral: Secondary | ICD-10-CM | POA: Diagnosis not present

## 2016-11-19 DIAGNOSIS — H401133 Primary open-angle glaucoma, bilateral, severe stage: Secondary | ICD-10-CM | POA: Diagnosis not present

## 2016-11-19 DIAGNOSIS — H16223 Keratoconjunctivitis sicca, not specified as Sjogren's, bilateral: Secondary | ICD-10-CM | POA: Diagnosis not present

## 2016-11-22 ENCOUNTER — Ambulatory Visit
Admission: RE | Admit: 2016-11-22 | Discharge: 2016-11-22 | Disposition: A | Discharge status: Home / Self Care | Payer: Medicare Other | Source: Ambulatory Visit | Attending: Allergy and Immunology | Admitting: Allergy and Immunology

## 2016-11-22 ENCOUNTER — Other Ambulatory Visit: Payer: Self-pay | Admitting: Allergy and Immunology

## 2016-11-22 DIAGNOSIS — R059 Cough, unspecified: Secondary | ICD-10-CM

## 2016-11-22 DIAGNOSIS — J4540 Moderate persistent asthma, uncomplicated: Secondary | ICD-10-CM | POA: Diagnosis not present

## 2016-11-22 DIAGNOSIS — R05 Cough: Secondary | ICD-10-CM

## 2016-11-22 DIAGNOSIS — J3089 Other allergic rhinitis: Secondary | ICD-10-CM | POA: Diagnosis not present

## 2016-12-13 IMAGING — DX DG CHEST 2V
2 series · 2 of 2 positions shown · non-contrast
Comparison: 06/09/2014

CLINICAL DATA: Chest pressure and tightness for 2 weeks. Initial
encounter.

EXAM:
CHEST  2 VIEW

[chest pa]
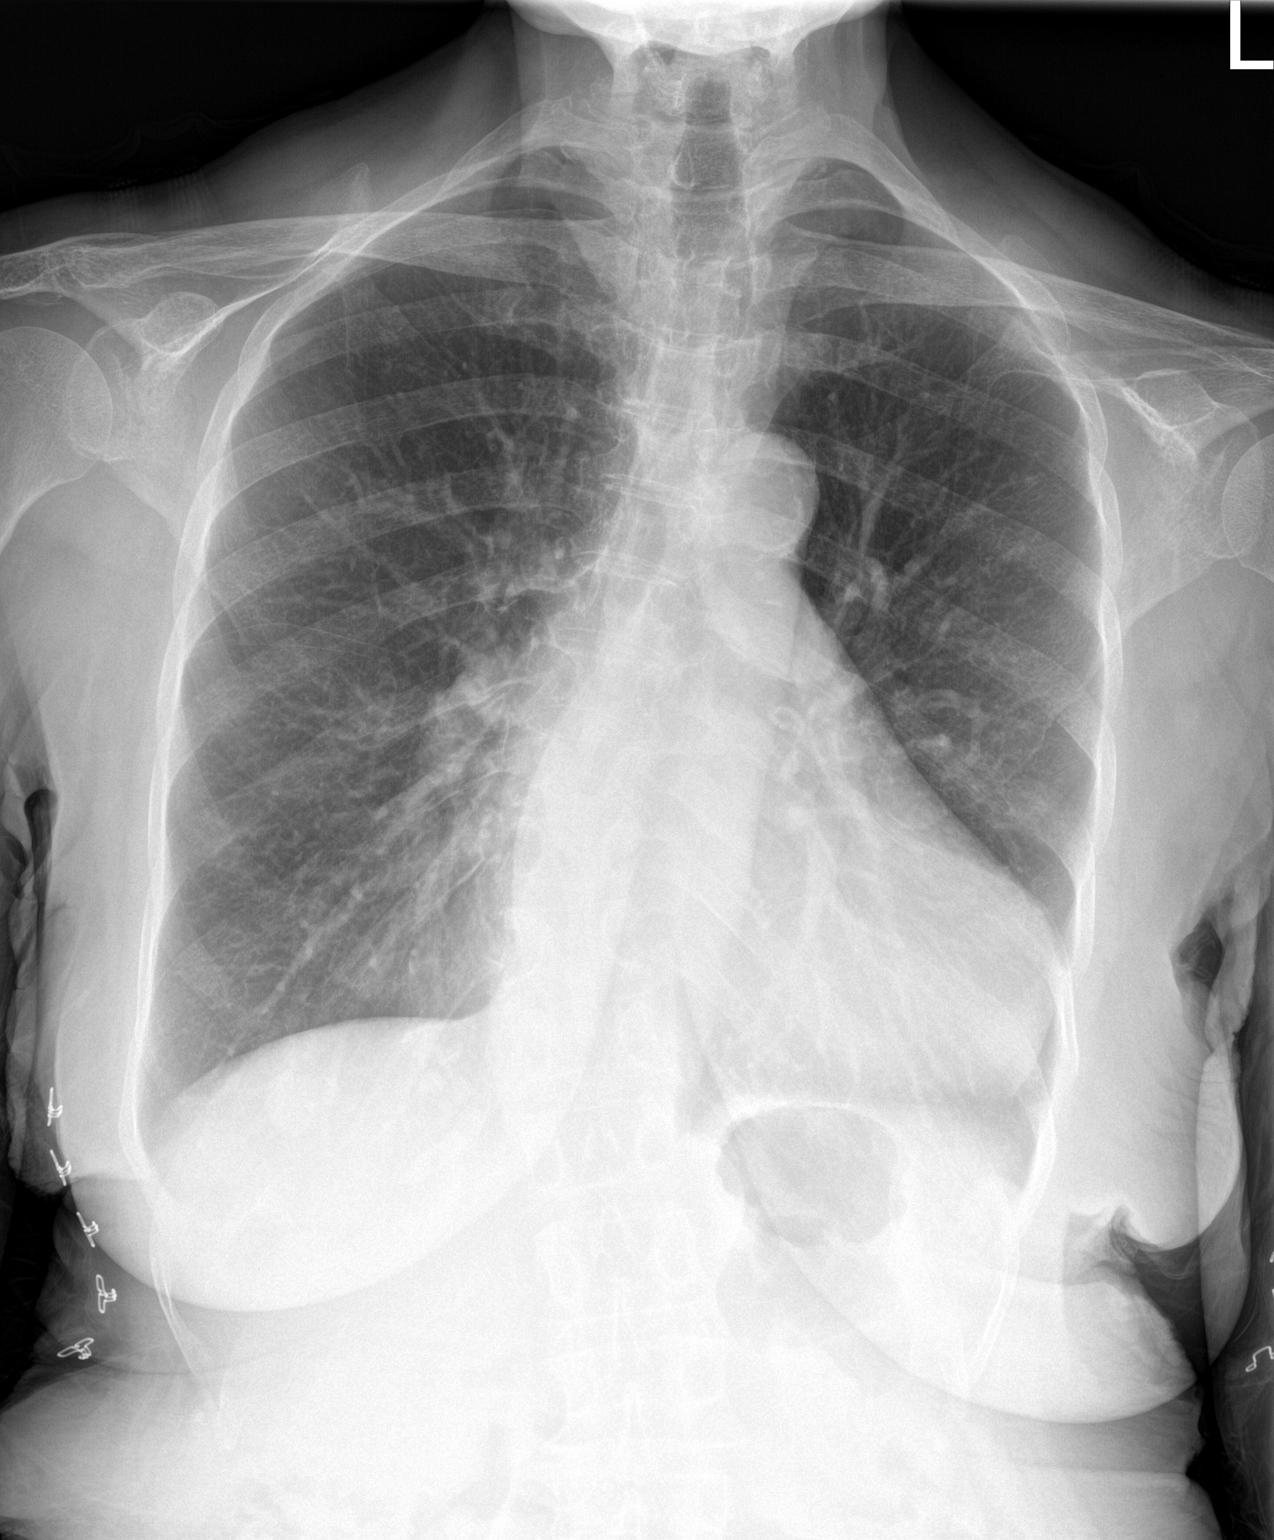

[chest lat]
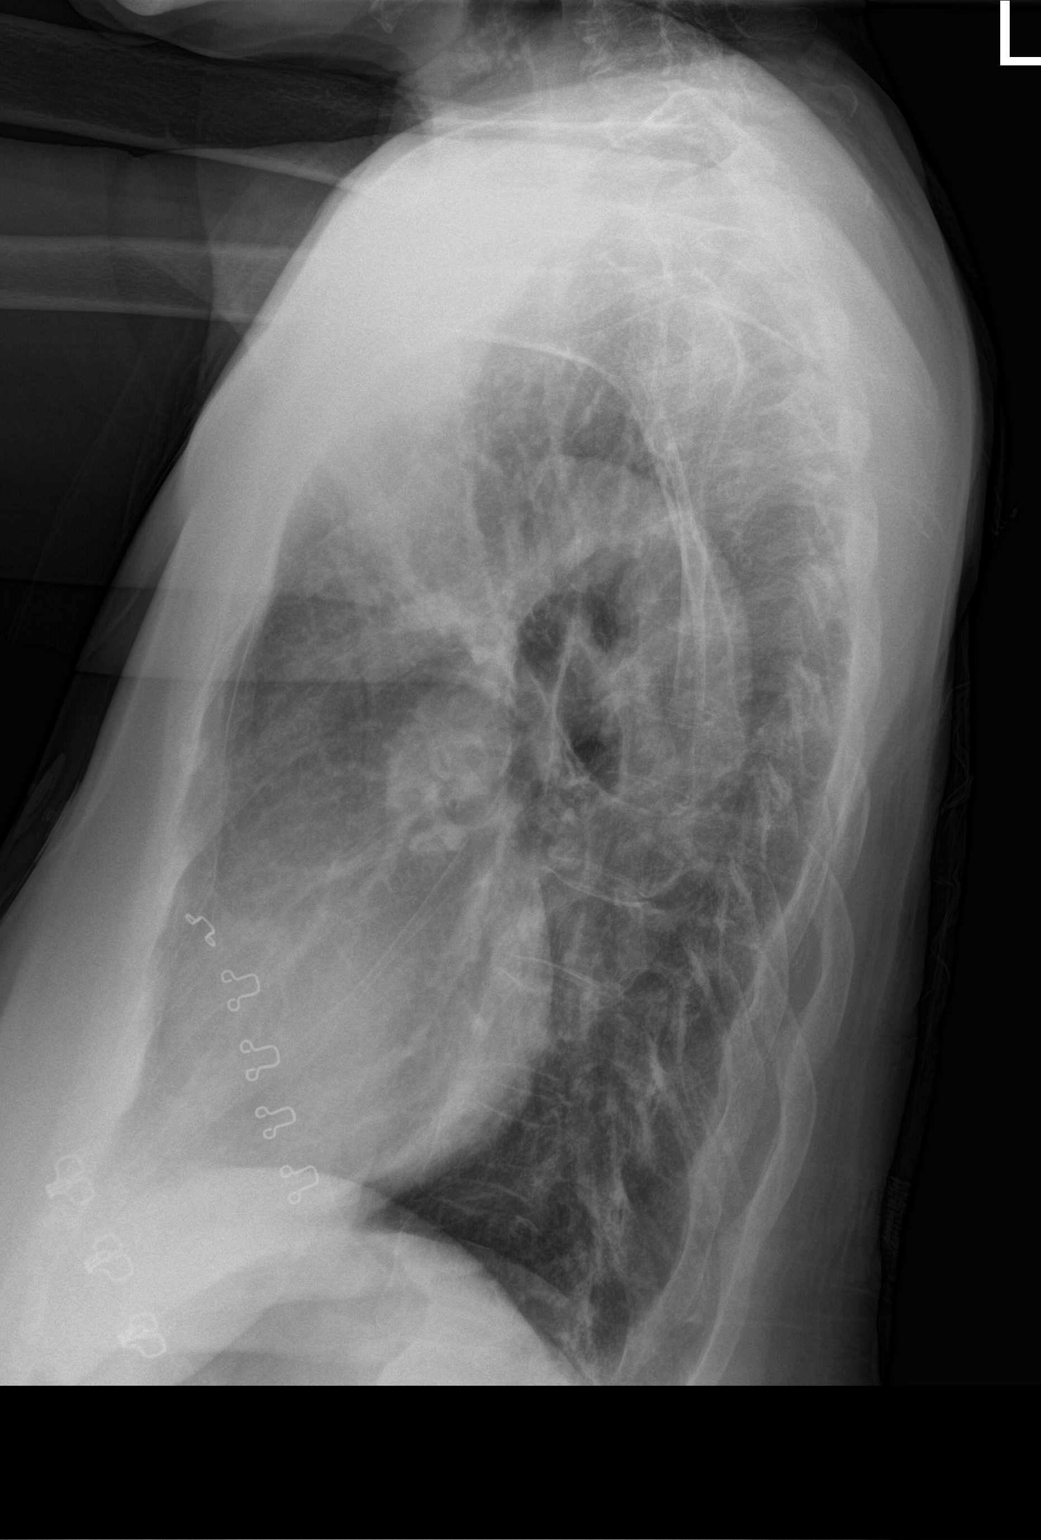

[2 of 2 positions shown; findings below may reference images not displayed]

FINDINGS: Cardiac silhouette is mildly enlarged. Normal mediastinal and hilar
contours.

Lungs are clear.  No pleural effusion or pneumothorax.

Bony thorax is demineralized but intact.
IMPRESSION: No active cardiopulmonary disease.

## 2016-12-17 ENCOUNTER — Ambulatory Visit: Payer: Medicare Other | Admitting: Podiatry

## 2016-12-20 ENCOUNTER — Other Ambulatory Visit: Payer: Self-pay | Admitting: Internal Medicine

## 2016-12-23 ENCOUNTER — Encounter: Payer: Self-pay | Admitting: Internal Medicine

## 2016-12-23 ENCOUNTER — Ambulatory Visit (INDEPENDENT_AMBULATORY_CARE_PROVIDER_SITE_OTHER): Payer: Medicare Other | Admitting: Internal Medicine

## 2016-12-23 VITALS — BP 130/70 | HR 58 | Temp 97.4°F | Wt 140.0 lb

## 2016-12-23 DIAGNOSIS — G5691 Unspecified mononeuropathy of right upper limb: Secondary | ICD-10-CM | POA: Diagnosis not present

## 2016-12-23 DIAGNOSIS — L84 Corns and callosities: Secondary | ICD-10-CM

## 2016-12-23 DIAGNOSIS — I10 Essential (primary) hypertension: Secondary | ICD-10-CM

## 2016-12-23 DIAGNOSIS — I6340 Cerebral infarction due to embolism of unspecified cerebral artery: Secondary | ICD-10-CM | POA: Diagnosis not present

## 2016-12-23 DIAGNOSIS — H5316 Psychophysical visual disturbances: Secondary | ICD-10-CM | POA: Diagnosis not present

## 2016-12-23 DIAGNOSIS — R441 Visual hallucinations: Secondary | ICD-10-CM

## 2016-12-23 NOTE — Patient Instructions (Addendum)
Come fasting to your next appointment so I can check your bloodwork.  Try a wrist brace support (velcro/fabric) to help with your numb right hand.   Let me know if any of your visions are getting worse or scaring you.    Ask the podiatrist about your right foot pain--it seems to be coming from your calluses

## 2016-12-23 NOTE — Progress Notes (Signed)
Location:  [facility] clinic Provider:  Josceline Chenard [name], D.O., C.M.D.  Code Status: full code Goals of Care:  Advanced Directives 09/19/2016  Does Patient Have a Medical Advance Directive? No  Type of Advance Directive -  Does patient want to make changes to medical advance directive? -  Copy of [place] in Chart? -  Would patient like information on creating a medical advance directive? -   Chief Complaint  Patient presents with  . Medical Management of Chronic Issues    3MTH FOLLOW-UP    HPI: Patient is a 83 y.o. female seen today for medical management of chronic diseases. Is accompanied by her daughter, Katharine Look.   Is c/o her hands and feet hurting x 3 weeks. Walking and standing makes it worse. Her right hand "feels like I have a glove on", meaning numbness that is intermittent. This has not happened before. Has not tried anything to make it better. No swelling in her legs. No falls.   Saw the neurologist 11/14/16, advised continue plavix and BP control. BP is great today 130/70 on norvasc 5 mg daily.   Does mention she is seeing images. "The images feet don't touch the floor and sometimes they come dangling toward me". Sometimes they are people or sheets of cloth. She does not recognize the people. It happens all the time, day and night. The risperidone is not helping. She is also seeing the walls moving and picture frames. She does like alone, and feels safe in her home.   Constipation is no longer and issue. Will take stool softner if needed, but not daily.   Past Medical History:  Diagnosis Date  . Anxiety   . Asthma   . Benign essential hypertension   . Bradycardia, drug induced 06/11/2016  . Edema extremities   . GERD (gastroesophageal reflux disease)   . Glaucoma   . Osteoarthritis   . PVD (peripheral vascular disease) ([place])   . Seasonal allergies   . Stroke [facility])     Past Surgical History:  Procedure Laterality Date  . ABDOMINAL HYSTERECTOMY  1981    . TONSILLECTOMY  1953    Allergies  Allergen Reactions  . Aspirin Other (See Comments)    sweats  . Codeine Other (See Comments)  . Iodine Other (See Comments)    Pt is not aware of this allergy, does not recall much info.  . Prednisone Other (See Comments)    sweats  . Benzonatate Itching and Rash  . Sulfa Antibiotics Other (See Comments)    Doesn't remember    Allergies as of 12/23/2016      Reactions   Aspirin Other (See Comments)   sweats   Codeine Other (See Comments)   Iodine Other (See Comments)   Pt is not aware of this allergy, does not recall much info.   Prednisone Other (See Comments)   sweats   Benzonatate Itching, Rash   Sulfa Antibiotics Other (See Comments)   Doesn't remember      Medication List       Accurate as of 12/23/16 10:52 AM. Always use your most recent med list.          acetaminophen 500 MG tablet Commonly known as:  TYLENOL Take 1 tablet (500 mg total) by mouth every 6 (six) hours as needed for moderate pain.   albuterol 108 (90 Base) MCG/ACT inhaler Commonly known as:  PROVENTIL HFA;VENTOLIN HFA Inhale 2 puffs into the lungs every 6 (six) hours as needed  for wheezing.   alum hydroxide-mag trisilicate 97-98 MG Chew chewable tablet Commonly known as:  GAVISCON Chew 1 tablet by mouth 3 (three) times daily as needed for flatulence (bowel spasms).   amLODipine 5 MG tablet Commonly known as:  NORVASC Take 5 mg by mouth daily.   atorvastatin 10 MG tablet Commonly known as:  LIPITOR Take 10 mg by mouth daily.   B COMPLEX PO Take 1 tablet by mouth daily.   bimatoprost 0.01 % Soln Commonly known as:  LUMIGAN Place 1 drop into both eyes at bedtime.   CALTRATE 600+D 600-400 MG-UNIT tablet Generic drug:  Calcium Carbonate-Vitamin D Take 2 tablets by mouth daily.   clopidogrel 75 MG tablet Commonly known as:  PLAVIX TAKE 1 TABLET (75 MG TOTAL) BY MOUTH DAILY.   CoQ10 50 MG Caps Take 50 mg by mouth daily.   docusate sodium  100 MG capsule Commonly known as:  COLACE Take 1 capsule (100 mg total) by mouth daily as needed for mild constipation (to soften stool).   Fish Oil 1200 MG Cpdr Take 1,200 mg by mouth daily. Take one tablet by mouth once daily   fluticasone 50 MCG/ACT nasal spray Commonly known as:  FLONASE Place 1 spray into the nose daily.   loteprednol 0.5 % ophthalmic suspension Commonly known as:  LOTEMAX Place 1 drop into both eyes daily as needed (for dry eyes).   nebivolol 5 MG tablet Commonly known as:  BYSTOLIC Take 1 tablet (5 mg total) by mouth daily.   nystatin cream Commonly known as:  MYCOSTATIN APPLY 1 APPLICATION TOPICALLY 3 (THREE) TIMES DAILY.   pantoprazole 40 MG tablet Commonly known as:  PROTONIX TAKE 1 TABLET (40 MG TOTAL) BY MOUTH DAILY.   RESTASIS 0.05 % ophthalmic emulsion Generic drug:  cycloSPORINE Place 1 drop into both eyes 2 (two) times daily.   risperiDONE 0.5 MG tablet Commonly known as:  RISPERDAL Take 1 tablet (0.5 mg total) by mouth 2 (two) times daily.   SENIOR MULTIVITAMIN PLUS Tabs Take 1 tablet by mouth daily.   SIMBRINZA 1-0.2 % Susp Generic drug:  Brinzolamide-Brimonidine Apply 1 drop to eye 3 (three) times daily.   SYMBICORT 80-4.5 MCG/ACT inhaler Generic drug:  budesonide-formoterol Place 2 puffs into alternate nostrils 2 (two) times daily.   vitamin C 500 MG tablet Commonly known as:  ASCORBIC ACID Take 500 mg by mouth daily.   Vitamin D3 2000 units Tabs Take 2,000 Units by mouth daily.       Review of Systems:  Review of Systems  Constitutional: Negative for chills, fever and malaise/fatigue.  HENT: Negative for hearing loss.   Eyes: Negative for blurred vision.       Glasses  Respiratory: Negative for cough and shortness of breath.   Cardiovascular: Negative for chest pain, palpitations and leg swelling.       Compression hose  Gastrointestinal: Negative for abdominal pain, blood in stool, constipation and melena.   Genitourinary: Negative for dysuria.  Musculoskeletal: Negative for falls.       Right foot pain  Skin: Negative for itching and rash.  Neurological: Positive for tingling and sensory change. Negative for dizziness, loss of consciousness and weakness.  Psychiatric/Behavioral: Positive for memory loss. Negative for depression.    Health Maintenance  Topic Date Due  . MAMMOGRAM  10/07/2018 (Originally 09/28/2009)  . TETANUS/TDAP  10/07/2018 (Originally 10/04/1943)  . INFLUENZA VACCINE  Completed  . DEXA SCAN  Completed  . PNA vac Low Risk Adult  Completed    Physical Exam: Vitals:   12/23/16 0954  BP: 130/70  Pulse: (!) 58  Temp: 97.4 F (36.3 C)  TempSrc: Oral  SpO2: 95%  Weight: 140 lb (63.5 kg)   Body mass index is 25.61 kg/m. Physical Exam  Constitutional: She is oriented to person, place, and time. She appears well-developed and well-nourished. No distress.  Cardiovascular: Normal rate and regular rhythm.   +S3  Pulmonary/Chest: Effort normal and breath sounds normal. No respiratory distress. She has no wheezes.  Abdominal: Soft. Bowel sounds are normal.  Musculoskeletal: Normal range of motion.  Uses cane  Neurological: She is alert and oriented to person, place, and time.  No active hallucinations during appt, only happen at home, diminished sensation of right hand  Skin: Skin is warm and dry. Capillary refill takes less than 2 seconds.  Calluses of right foot under MTPs  Psychiatric: She has a normal mood and affect.    Labs reviewed: Basic Metabolic Panel:  Recent Labs  04/22/16 1118 06/26/16 1056 09/19/16 1415 09/19/16 1425  NA 143 142 140 142  K 3.9 3.7 3.6 3.6  CL 109 110 108 106  CO2 25 24 23   --   GLUCOSE 92 115* 171* 164*  BUN 21 22 11 11   CREATININE 1.04* 0.85 1.00 0.90  CALCIUM 9.2 9.6 9.7  --    Liver Function Tests:  Recent Labs  04/22/16 1118 09/19/16 1415  AST 25 30  ALT 29 23  ALKPHOS 71 78  BILITOT 1.0 1.6*  PROT 6.5 7.4   ALBUMIN 3.9 4.2   No results for input(s): LIPASE, AMYLASE in the last 8760 hours. No results for input(s): AMMONIA in the last 8760 hours. CBC:  Recent Labs  04/22/16 1118 09/19/16 1415 09/19/16 1425  WBC 4.8 5.2  --   NEUTROABS 2,688 3.3  --   HGB 11.2* 12.7 13.3  HCT 34.6* 36.7 39.0  MCV 95.8 90.8  --   PLT 178 198  --    Lipid Panel: No results for input(s): CHOL, HDL, LDLCALC, TRIG, CHOLHDL, LDLDIRECT in the last 8760 hours. Lab Results  Component Value Date   HGBA1C 5.6 04/22/2016    Assessment/Plan 1. Spells of formed visual hallucinations -pt reports having had these for 4 years, but has not been c/o any paranoid delusions, olfactory hallucinations anymore -she is on risperdal low dose, cont same as these new hallucinations are not bothering her or interfering with her day to day routine -if they should become frightening or cause her to have abnormal behaviors/functioning, we will change her antipsychotic  2. Neuropathy of right hand -seems more likely to be carpal tunnel considering lack of neck symptoms -will try a wrist support and monitor  3. Plantar callus -right foot, advised to discuss with her podiatrist tomorrow  4. Benign essential HTN -bp at goal with current regimen, cont same  Labs/tests ordered:  No orders of the defined types were placed in this encounter.  Next appt:  03/24/2017 come fasting for labs   Amyre Segundo L. Tyronne Blann, D.O. [facility] [address], [place] [address] Cell Phone (Mon-Fri 8am-5pm):  [phone] On Call:  [phone] & follow prompts after 5pm & weekends Office Phone:  [phone] Office Fax:  [phone]

## 2016-12-24 ENCOUNTER — Encounter: Payer: Self-pay | Admitting: Podiatry

## 2016-12-24 ENCOUNTER — Ambulatory Visit (INDEPENDENT_AMBULATORY_CARE_PROVIDER_SITE_OTHER): Payer: Medicare Other | Admitting: Podiatry

## 2016-12-24 DIAGNOSIS — B351 Tinea unguium: Secondary | ICD-10-CM | POA: Diagnosis not present

## 2016-12-24 DIAGNOSIS — G5793 Unspecified mononeuropathy of bilateral lower limbs: Secondary | ICD-10-CM

## 2016-12-24 NOTE — Progress Notes (Signed)
[name], female   DOB: 11/23/1923, 83 y.o.   MRN: [id number] Complaint:  Visit Type: Patient returns to my office for continued preventative foot care services. Complaint: Patient states" my nails have grown long and thick and become painful to walk and wear shoes" Patient has been diagnosed with neuropathy both feet.. The patient presents for preventative foot care services. No changes to ROS.  She has painful callus both feet which is painful walking in her shoes  Podiatric Exam: Vascular: dorsalis pedis and posterior tibial pulses are palpable bilateral. Capillary return is immediate. Temperature gradient is WNL. Skin turgor WNL  Sensorium: Normal Semmes Weinstein monofilament test. Normal tactile sensation bilaterally. Nail Exam: Pt has thick disfigured discolored nails with subungual debris noted bilateral entire nail hallux through fifth toenails Ulcer Exam: There is no evidence of ulcer or pre-ulcerative changes or infection. Orthopedic Exam: Muscle tone and strength are WNL. No limitations in general ROM. No crepitus or effusions noted. Foot type and digits show no abnormalities. Bony prominences are unremarkable. Skin: No evidence of callus .   Onychomycosis, , Pain in right toe, pain in left toes, porokeratosis B/L  Treatment & Plan Procedures and Treatment: Consent by patient was obtained for treatment procedures. The patient understood the discussion of treatment and procedures well. All questions were answered thoroughly reviewed. Debridement of mycotic and hypertrophic toenails, 1 through 5 bilateral and clearing of subungual debris. No ulceration, no infection noted. Padding dispensed. Return Visit-Office Procedure: Patient instructed to return to the office for a follow up visit 3 months for continued evaluation and treatment.    Gardiner Barefoot DPM

## 2016-12-31 ENCOUNTER — Other Ambulatory Visit: Payer: Self-pay | Admitting: Internal Medicine

## 2016-12-31 DIAGNOSIS — H02403 Unspecified ptosis of bilateral eyelids: Secondary | ICD-10-CM | POA: Diagnosis not present

## 2016-12-31 DIAGNOSIS — K649 Unspecified hemorrhoids: Secondary | ICD-10-CM

## 2016-12-31 DIAGNOSIS — H16223 Keratoconjunctivitis sicca, not specified as Sjogren's, bilateral: Secondary | ICD-10-CM | POA: Diagnosis not present

## 2016-12-31 DIAGNOSIS — R0989 Other specified symptoms and signs involving the circulatory and respiratory systems: Secondary | ICD-10-CM | POA: Diagnosis not present

## 2017-01-01 ENCOUNTER — Telehealth: Payer: Self-pay | Admitting: *Deleted

## 2017-01-01 NOTE — Telephone Encounter (Signed)
I'm not in the office.  Please check my files to see if the letter is there.  I don't see anything in epic.

## 2017-01-01 NOTE — Telephone Encounter (Signed)
Checked folders in office and no letter found. I called and left message for Katharine Look to call Dr. Gertie Exon office and request the letter to be faxed to our office and gave her the fax number.

## 2017-01-01 NOTE — Telephone Encounter (Signed)
Patient daughter, Katharine Look called and wanted to know if you received a letter from patient's eye dr--Dr. Venetia Maxon regarding a test he wanted done on patient. Daughter was just following up.  Please Advise.

## 2017-01-04 ENCOUNTER — Other Ambulatory Visit: Payer: Self-pay | Admitting: Internal Medicine

## 2017-01-06 ENCOUNTER — Other Ambulatory Visit: Payer: Self-pay | Admitting: *Deleted

## 2017-01-06 MED ORDER — RISPERIDONE 0.5 MG PO TABS: 0.5000 mg | ORAL_TABLET | Freq: Two times a day (BID) | ORAL | 0 refills | Status: DC

## 2017-01-06 NOTE — Telephone Encounter (Signed)
Patient requested a Refill.

## 2017-01-16 ENCOUNTER — Other Ambulatory Visit: Payer: Self-pay | Admitting: Internal Medicine

## 2017-01-19 ENCOUNTER — Other Ambulatory Visit: Payer: Self-pay | Admitting: Internal Medicine

## 2017-01-19 DIAGNOSIS — K219 Gastro-esophageal reflux disease without esophagitis: Secondary | ICD-10-CM

## 2017-01-23 ENCOUNTER — Other Ambulatory Visit: Payer: Self-pay | Admitting: Internal Medicine

## 2017-01-23 MED ORDER — ATORVASTATIN CALCIUM 10 MG PO TABS: 10.0000 mg | ORAL_TABLET | Freq: Every day | ORAL | 1 refills | Status: DC

## 2017-01-27 ENCOUNTER — Other Ambulatory Visit: Payer: Self-pay | Admitting: Internal Medicine

## 2017-02-13 ENCOUNTER — Ambulatory Visit: Payer: Medicare Other | Admitting: Nurse Practitioner

## 2017-02-17 ENCOUNTER — Encounter: Payer: Self-pay | Admitting: Nurse Practitioner

## 2017-02-17 ENCOUNTER — Ambulatory Visit (INDEPENDENT_AMBULATORY_CARE_PROVIDER_SITE_OTHER): Payer: Medicare Other | Admitting: Nurse Practitioner

## 2017-02-17 VITALS — BP 122/68 | HR 64 | Temp 98.3°F | Resp 17 | Ht 62.0 in | Wt 139.2 lb

## 2017-02-17 DIAGNOSIS — I6340 Cerebral infarction due to embolism of unspecified cerebral artery: Secondary | ICD-10-CM | POA: Diagnosis not present

## 2017-02-17 DIAGNOSIS — R251 Tremor, unspecified: Secondary | ICD-10-CM | POA: Diagnosis not present

## 2017-02-17 DIAGNOSIS — G629 Polyneuropathy, unspecified: Secondary | ICD-10-CM | POA: Diagnosis not present

## 2017-02-17 MED ORDER — GABAPENTIN 100 MG PO CAPS: 100.0000 mg | ORAL_CAPSULE | Freq: Two times a day (BID) | ORAL | 1 refills | Status: DC

## 2017-02-17 NOTE — Progress Notes (Signed)
Careteam: Patient Care Team: Gayland Curry, DO as PCP - General (Geriatric Medicine)  Advanced Directive information Does Patient Have a Medical Advance Directive?: Yes, Type of Advance Directive: [place];Living will  Allergies  Allergen Reactions  . Aspirin Other (See Comments)    sweats  . Codeine Other (See Comments)  . Iodine Other (See Comments)    Pt is not aware of this allergy, does not recall much info.  . Prednisone Other (See Comments)    sweats  . Benzonatate Itching and Rash  . Sulfa Antibiotics Other (See Comments)    Doesn't remember    Chief Complaint  Patient presents with  . Acute Visit    Pt is being seen due to numbness and pain in hands and feet for a month.      HPI: Patient is a 83 y.o. female seen in the office today due to numbness and tingling in hands and feet for 1 month. Reports numbness and tingling in her hands are better today but still there. Also fine tremor in right hand.   Reports numbness and tingling in both hands and both feet. Complained at last visit about hands but did not mention feet. Also has not done wrist brace which was recommended.  Reports numbness and tingling in right worse than left.  Denies back pain. No paresthesias  Reports tremor in her right hand when she is trying to eat something and sometimes food will fall her fork. Reports this affects her daily and some days are worse than others however this is not bothering her today.  Review of Systems:  Review of Systems  Constitutional: Negative for activity change, appetite change, fatigue and unexpected weight change.  HENT: Negative for congestion and hearing loss.   Eyes: Negative.   Respiratory: Negative for cough and shortness of breath.   Cardiovascular: Negative for chest pain, palpitations and leg swelling.  Gastrointestinal: Negative for abdominal distention, abdominal pain, constipation and diarrhea.          Genitourinary: Negative  for difficulty urinating and dysuria.  Musculoskeletal: Negative for back pain.  Skin: Negative for color change and wound.  Neurological: Positive for tremors and numbness. Negative for dizziness, facial asymmetry, light-headedness and headaches.  Psychiatric/Behavioral: Positive for confusion.    Past Medical History:  Diagnosis Date  . Anxiety   . Asthma   . Benign essential hypertension   . Bradycardia, drug induced 06/11/2016  . Edema extremities   . GERD (gastroesophageal reflux disease)   . Glaucoma   . Osteoarthritis   . PVD (peripheral vascular disease) ([place])   . Seasonal allergies   . Stroke [facility])    Past Surgical History:  Procedure Laterality Date  . ABDOMINAL HYSTERECTOMY  1981  . TONSILLECTOMY  1953   Social History:   reports that she has never smoked. She has never used smokeless tobacco. She reports that she does not drink alcohol or use drugs.  Family History  Problem Relation Age of Onset  . Stroke Father   . Hypertension Father   . Heart disease Mother   . Hypertension Mother   . Diabetes Mother     Medications: Patient's Medications  New Prescriptions   No medications on file  Previous Medications   ACETAMINOPHEN (TYLENOL) 500 MG TABLET    Take 1 tablet (500 mg total) by mouth every 6 (six) hours as needed for moderate pain.   ALBUTEROL (PROVENTIL HFA;VENTOLIN HFA) 108 (90 BASE) MCG/ACT INHALER  Inhale 2 puffs into the lungs every 6 (six) hours as needed for wheezing.   ALUM HYDROXIDE-MAG TRISILICATE (GAVISCON) 58-52 MG CHEW CHEWABLE TABLET    Chew 1 tablet by mouth 3 (three) times daily as needed for flatulence (bowel spasms).   AMLODIPINE (NORVASC) 5 MG TABLET    TAKE ONE TABLET BY MOUTH ONCE DAILY   ATORVASTATIN (LIPITOR) 10 MG TABLET    Take 1 tablet (10 mg total) by mouth daily.   B COMPLEX VITAMINS (B COMPLEX PO)    Take 1 tablet by mouth daily.   BIMATOPROST (LUMIGAN) 0.01 % SOLN    Place 1 drop into both eyes at bedtime.    BRINZOLAMIDE-BRIMONIDINE (SIMBRINZA) 1-0.2 % SUSP    Apply 1 drop to eye 3 (three) times daily.   CALCIUM CARBONATE-VITAMIN D (CALTRATE 600+D) 600-400 MG-UNIT PER TABLET    Take 2 tablets by mouth daily.   CHOLECALCIFEROL (VITAMIN D3) 2000 UNITS TABS    Take 2,000 Units by mouth daily.   CLOPIDOGREL (PLAVIX) 75 MG TABLET    TAKE 1 TABLET (75 MG TOTAL) BY MOUTH DAILY.   COENZYME Q10 (COQ10) 50 MG CAPS    Take 50 mg by mouth daily.   DOCUSATE SODIUM (COLACE) 100 MG CAPSULE    Take 1 capsule (100 mg total) by mouth daily as needed for mild constipation (to soften stool).   FLUTICASONE (FLONASE) 50 MCG/ACT NASAL SPRAY    Place 1 spray into the nose daily.   LOTEPREDNOL (LOTEMAX) 0.5 % OPHTHALMIC SUSPENSION    Place 1 drop into both eyes daily as needed (for dry eyes).    MULTIPLE VITAMINS-MINERALS (SENIOR MULTIVITAMIN PLUS) TABS    Take 1 tablet by mouth daily.    NEBIVOLOL (BYSTOLIC) 5 MG TABLET    Take 1 tablet (5 mg total) by mouth daily.   NYSTATIN CREAM (MYCOSTATIN)    APPLY 1 APPLICATION TOPICALLY 3 (THREE) TIMES DAILY.   OMEGA-3 FATTY ACIDS (FISH OIL) 1200 MG CPDR    Take 1,200 mg by mouth daily. Take one tablet by mouth once daily    PANTOPRAZOLE (PROTONIX) 40 MG TABLET    TAKE 1 TABLET (40 MG TOTAL) BY MOUTH DAILY.   RESTASIS 0.05 % OPHTHALMIC EMULSION    Place 1 drop into both eyes 2 (two) times daily.    RISPERIDONE (RISPERDAL) 0.5 MG TABLET    Take 1 tablet (0.5 mg total) by mouth 2 (two) times daily.   SYMBICORT 80-4.5 MCG/ACT INHALER    Place 2 puffs into alternate nostrils 2 (two) times daily.   VITAMIN C (ASCORBIC ACID) 500 MG TABLET    Take 500 mg by mouth daily.  Modified Medications   No medications on file  Discontinued Medications   No medications on file     Physical Exam:  Vitals:   02/17/17 1521  BP: 122/68  Pulse: 64  Resp: 17  Temp: 98.3 F (36.8 C)  TempSrc: Oral  SpO2: 97%  Weight: 139 lb 3.2 oz (63.1 kg)  Height: 5\' 2"  (1.575 m)   Body mass index is  25.46 kg/m.  Physical Exam  Constitutional: She appears well-developed and well-nourished. No distress.  Cardiovascular: Normal rate, regular rhythm and intact distal pulses.   +S3  Pulmonary/Chest: Effort normal and breath sounds normal. No respiratory distress. She has no wheezes.  Musculoskeletal: Normal range of motion. She exhibits no edema.  Uses cane  Neurological: She is alert.  diminished sensation of right hand and right foot No tremor noted  Skin: Skin is warm and dry.  Calluses of right foot under MTPs  Psychiatric: She has a normal mood and affect.    Labs reviewed: Basic Metabolic Panel:  Recent Labs  04/22/16 1118 06/26/16 1056 09/19/16 1415 09/19/16 1425  NA 143 142 140 142  K 3.9 3.7 3.6 3.6  CL 109 110 108 106  CO2 25 24 23   --   GLUCOSE 92 115* 171* 164*  BUN 21 22 11 11   CREATININE 1.04* 0.85 1.00 0.90  CALCIUM 9.2 9.6 9.7  --    Liver Function Tests:  Recent Labs  04/22/16 1118 09/19/16 1415  AST 25 30  ALT 29 23  ALKPHOS 71 78  BILITOT 1.0 1.6*  PROT 6.5 7.4  ALBUMIN 3.9 4.2   No results for input(s): LIPASE, AMYLASE in the last 8760 hours. No results for input(s): AMMONIA in the last 8760 hours. CBC:  Recent Labs  04/22/16 1118 09/19/16 1415 09/19/16 1425  WBC 4.8 5.2  --   NEUTROABS 2,688 3.3  --   HGB 11.2* 12.7 13.3  HCT 34.6* 36.7 39.0  MCV 95.8 90.8  --   PLT 178 198  --    Lipid Panel: No results for input(s): CHOL, HDL, LDLCALC, TRIG, CHOLHDL, LDLDIRECT in the last 8760 hours. TSH: No results for input(s): TSH in the last 8760 hours. A1C: Lab Results  Component Value Date   HGBA1C 5.6 04/22/2016     Assessment/Plan 1. Neuropathy -to start 1 tablets at night for 4 nights and then increase to twice daily for neuropathy  - gabapentin (NEURONTIN) 100 MG capsule; Take 1 capsule (100 mg total) by mouth 2 (two) times daily.  Dispense: 60 capsule; Refill: 1  2. Tremor No tremor seen at today's visit, will monitor  at this time. If worsens to notify before next follow up  Coryell. Harle Battiest  [facility] [phone] 8 am - 5 pm) [phone] (after hours)

## 2017-02-17 NOTE — Patient Instructions (Signed)
Will start gabapentin 100 mg by mouth twice daily Monitor for being too sleepy

## 2017-02-18 ENCOUNTER — Telehealth: Payer: Self-pay | Admitting: *Deleted

## 2017-02-18 MED ORDER — PREGABALIN 75 MG PO CAPS: ORAL_CAPSULE | ORAL | 3 refills | Status: DC

## 2017-02-18 NOTE — Telephone Encounter (Signed)
Could try lyrica 75 mg twice daily, could still make her drowsy so be mindful of that

## 2017-02-18 NOTE — Telephone Encounter (Signed)
Patient notified and agreed. Medication list updated.  

## 2017-02-18 NOTE — Telephone Encounter (Signed)
Patient called and stated that she and her daughter agree that she should not take the Gabapentin prescribed by you yesterday. Stated that they picked up the medication yesterday and after they read the side effects she doesn't feel like she should take it and wants something else prescribed. Please Advise.

## 2017-02-19 ENCOUNTER — Telehealth: Payer: Self-pay | Admitting: Podiatry

## 2017-02-19 NOTE — Telephone Encounter (Addendum)
I spoke with pt and told her to tell Arley Phenix that I had called.02/21/2017-Pt's dtr, Arley Phenix states her mtr is complaining of neuropathy pain and she would like to know if there was anything else Dr. Prudence Davidson could prescribe other than Lyrica. I told Ms Rosine Door, Dr. Prudence Davidson did not prescribe long-term medication, I would have schedulers contact her to schedule with another doctor that will work to help pt with her medications and discomfort.

## 2017-02-19 NOTE — Telephone Encounter (Signed)
Yes, I'm calling in regards to my mother [name] [name] who is a patient of Dr. Burnell Blanks. She is continuing to have problems and pain with her neuropathy. She has tried the inserts and that has not helped. Would like Dr. Prudence Davidson to call her with recommendations for medication for the neuropathy. You can reach her at [phone].

## 2017-02-21 ENCOUNTER — Telehealth: Payer: Self-pay | Admitting: *Deleted

## 2017-02-21 NOTE — Telephone Encounter (Signed)
Can decrease lyrica to once daily to see if this helps theses symptoms (to try to take at bedtime only) if this does not help will need another OV before another medication is prescribed

## 2017-02-21 NOTE — Telephone Encounter (Signed)
Patient daughter, Katharine Look notified and stated that she will make an appointment when she gets back into town to have the medication changed to something different.

## 2017-02-21 NOTE — Telephone Encounter (Signed)
Katharine Look, daughter called and stated that patient has been taking the Lyrica for 2 days now and it is causing disfunction, Sleeps all the time, Groggy and legs wobbly. Daughter does not want patient taking this for fear of falling. Would like something else prescribed for the neuropathy. Please Advise.

## 2017-03-13 ENCOUNTER — Ambulatory Visit (INDEPENDENT_AMBULATORY_CARE_PROVIDER_SITE_OTHER): Payer: Medicare Other | Admitting: Podiatry

## 2017-03-13 ENCOUNTER — Encounter: Payer: Self-pay | Admitting: Podiatry

## 2017-03-13 DIAGNOSIS — M79672 Pain in left foot: Secondary | ICD-10-CM | POA: Diagnosis not present

## 2017-03-13 DIAGNOSIS — M779 Enthesopathy, unspecified: Secondary | ICD-10-CM | POA: Diagnosis not present

## 2017-03-13 DIAGNOSIS — M79671 Pain in right foot: Secondary | ICD-10-CM | POA: Diagnosis not present

## 2017-03-13 DIAGNOSIS — I6340 Cerebral infarction due to embolism of unspecified cerebral artery: Secondary | ICD-10-CM | POA: Diagnosis not present

## 2017-03-14 ENCOUNTER — Telehealth: Payer: Self-pay | Admitting: Neurology

## 2017-03-14 DIAGNOSIS — G5793 Unspecified mononeuropathy of bilateral lower limbs: Secondary | ICD-10-CM

## 2017-03-14 NOTE — Telephone Encounter (Signed)
Rn call patients daughter [name] [name] about her mom having numbness in feet and hands. [name] [name] stated he mom went to her PCP, and foot doctor for this issue. [name] [name] reported the PCP prescribed her gabapentin, bu she read all the major side effects, like depression, suicide ideations. Pt went to her foot doctor, and he evaluate her and prescribed her lyrica. Pt was taking 75 one tablet twice a day,but the medication made her sleep a lot. Rn stated pt was discharge back to her PCP in Februray 2018. Rn stated a message will be sent to Dr.Xu. Pts daughter verbalized understanding.

## 2017-03-14 NOTE — Telephone Encounter (Signed)
Patients daughter called and requested to speak with someone regarding some new symptoms the patient is experiencing. She is having some hand and feet numbness. Please call advise.

## 2017-03-15 NOTE — Addendum Note (Signed)
Addended by: Rosalin Hawking on: 03/15/2017 07:04 AM   Modules accepted: Orders

## 2017-03-15 NOTE — Telephone Encounter (Signed)
I called daughter 6:45pm on 03/14/17 and discussed with her over the phone. Daughter stated that 4 months ago, pt started to have hand and feet numbness tingling. She went to PCP and prescribed low dose gabapentin 100mg  bid, but due to potential side effects, it was changed to lyrica 75mg . However, it made pt very sleepy and drowsy and not able to function. lyrica was stopped then. Recently, pt went to see podiatrist. And was told feet numbness was due to tendon inflammation and she was prescribed with topical cream. However, podiatrist thought the hand numbness likely from different etiology from the feet. Pt was recommended to contact neurology. As per daughter the hand numbness is in glove distribution pattern.   I told daughter that the symptoms still more consistent with neuropathy, as least in hands. Will do EMG/NCS to further confirm. Will see how she respond to topical creams, if effective, we can also use in hands. Daughter expressed appreciation.  Rosalin Hawking, MD PhD Stroke Neurology 03/15/2017 7:03 AM  Orders Placed This Encounter  Procedures  . NCV with EMG(electromyography)    Standing Status:   Future    Standing Expiration Date:   03/15/2018    Order Specific Question:   Where should this test be performed?    Answer:   [facility]

## 2017-03-16 NOTE — Progress Notes (Signed)
Subjective: 83 year old female presents the office today for concerns of bilateral foot pain. She states this been ongoing for about 4 months has been a gradual onset. She describes as a throbbing sensation. She has previously been diagnosed neuropathy and she has been placed on gabapentin as well as Lyrica however she was having side effects and medications on unable to take them. She also has a tremor to the right hand that she's been following her primary care physician. She denies any recent injury or trauma to bilateral lower extremities and she denies any swelling or redness or any warmth. She presents today with her daughter. Denies any systemic complaints such as fevers, chills, nausea, vomiting. No acute changes since last appointment, and no other complaints at this time.   Objective: AAO x3, NAD DP/PT pulses palpable bilaterally, CRT less than 3 seconds; she denies any claudication symptoms.  Overall his sensation appears to be intact with Derrel Nip monofilament. There is tenderness along the medial aspect of the ankle on the course of posterior tibial tendon bilaterally. Also some mild discomfort on the plantar aspect of the foot on the arch of the foot along the plantar fascia. There is no specific area pinpoint bony tenderness or pain the vibratory sensation.  No open lesions or pre-ulcerative lesions.  No pain with calf compression, swelling, warmth, erythema  Assessment: 83 year old female bilateral foot pain likely tendinitis  Plan: -All treatment options discussed with the patient including all alternatives, risks, complications.  -I discussed treatment options with the patient. We discussed shoe gear modifications and even insert the length of his shoe. She does have some throbbing sensation she also does describe some nerve type symptoms to her feet as well. I ordered a compound cream to include topical gabapentin with anti-inflammatory that she continues on an as-needed  basis to both of her feet. -They were inquiring about the tremor to her right hand and about a brace the right hand. I recommended then to discuss this with her primary care physician. -Patient encouraged to call the office with any questions, concerns, change in symptoms.   Celesta Gentile, DPM

## 2017-03-24 ENCOUNTER — Encounter: Payer: Self-pay | Admitting: Internal Medicine

## 2017-03-24 ENCOUNTER — Ambulatory Visit (INDEPENDENT_AMBULATORY_CARE_PROVIDER_SITE_OTHER): Payer: Medicare Other

## 2017-03-24 ENCOUNTER — Ambulatory Visit (INDEPENDENT_AMBULATORY_CARE_PROVIDER_SITE_OTHER): Payer: Medicare Other | Admitting: Internal Medicine

## 2017-03-24 VITALS — BP 132/64 | HR 52 | Temp 97.6°F | Ht 62.0 in | Wt 139.0 lb

## 2017-03-24 VITALS — BP 132/61 | HR 52 | Temp 97.6°F | Ht 62.0 in | Wt 139.0 lb

## 2017-03-24 DIAGNOSIS — I10 Essential (primary) hypertension: Secondary | ICD-10-CM | POA: Diagnosis not present

## 2017-03-24 DIAGNOSIS — Z23 Encounter for immunization: Secondary | ICD-10-CM

## 2017-03-24 DIAGNOSIS — F22 Delusional disorders: Secondary | ICD-10-CM

## 2017-03-24 DIAGNOSIS — E7800 Pure hypercholesterolemia, unspecified: Secondary | ICD-10-CM | POA: Diagnosis not present

## 2017-03-24 DIAGNOSIS — G3184 Mild cognitive impairment, so stated: Secondary | ICD-10-CM | POA: Diagnosis not present

## 2017-03-24 DIAGNOSIS — G5793 Unspecified mononeuropathy of bilateral lower limbs: Secondary | ICD-10-CM | POA: Diagnosis not present

## 2017-03-24 DIAGNOSIS — I6340 Cerebral infarction due to embolism of unspecified cerebral artery: Secondary | ICD-10-CM

## 2017-03-24 DIAGNOSIS — Z0000 Encounter for general adult medical examination without abnormal findings: Secondary | ICD-10-CM | POA: Diagnosis not present

## 2017-03-24 LAB — CBC WITH DIFFERENTIAL/PLATELET
Basophils Absolute: 0 cells/uL (ref 0–200)
Basophils Relative: 0 %
Eosinophils Absolute: 112 cells/uL (ref 15–500)
Eosinophils Relative: 2 %
HCT: 36.1 % (ref 35.0–45.0)
Hemoglobin: 11.8 g/dL (ref 11.7–15.5)
Lymphocytes Relative: 32 %
Lymphs Abs: 1792 cells/uL (ref 850–3900)
MCH: 31.3 pg (ref 27.0–33.0)
MCHC: 32.7 g/dL (ref 32.0–36.0)
MCV: 95.8 fL (ref 80.0–100.0)
MPV: 9.9 fL (ref 7.5–12.5)
Monocytes Absolute: 560 cells/uL (ref 200–950)
Monocytes Relative: 10 %
Neutro Abs: 3136 cells/uL (ref 1500–7800)
Neutrophils Relative %: 56 %
Platelets: 179 10*3/uL (ref 140–400)
RBC: 3.77 MIL/uL — ABNORMAL LOW (ref 3.80–5.10)
RDW: 14.8 % (ref 11.0–15.0)
WBC: 5.6 10*3/uL (ref 3.8–10.8)

## 2017-03-24 MED ORDER — ZOSTER VAC RECOMB ADJUVANTED 50 MCG/0.5ML IM SUSR: 0.5000 mL | Freq: Once | INTRAMUSCULAR | 1 refills | Status: AC

## 2017-03-24 NOTE — Progress Notes (Signed)
Location:  [facility] clinic Provider:  Chrisopher Pustejovsky [name], D.O., C.M.D.  Code Status: full code Goals of Care:  Advanced Directives 03/24/2017  Does Patient Have a Medical Advance Directive? Yes  Type of Paramedic of [place];Living will  Does patient want to make changes to medical advance directive? No - Patient declined  Copy of [place] in Chart? No - copy requested  Would patient like information on creating a medical advance directive? -   Chief Complaint  Patient presents with  . Medical Management of Chronic Issues    67mth follow-up    HPI: Patient is a 83 y.o. female seen today for medical management of chronic diseases.    25/30 on MMSE today, passed clock.    Gabapentin was stopped 100mg  po bid due to lethargy.  At neurology, she was put on lyrica 75mg  po daily.  It also made her drowsy and not functional so it was stopped.    She also was dizzy, wobbly-legged and unsteady.  Stayed in PJs all day.  It's helpful, but it's a pain--having to do too often.  Cream is compounded with gabapentin in it.  Past Medical History:  Diagnosis Date  . Anxiety   . Asthma   . Benign essential hypertension   . Bradycardia, drug induced 06/11/2016  . Edema extremities   . GERD (gastroesophageal reflux disease)   . Glaucoma   . Osteoarthritis   . PVD (peripheral vascular disease) ([place])   . Seasonal allergies   . Stroke [facility])     Past Surgical History:  Procedure Laterality Date  . ABDOMINAL HYSTERECTOMY  1981  . TONSILLECTOMY  1953    Allergies  Allergen Reactions  . Aspirin Other (See Comments)    sweats  . Codeine Other (See Comments)  . Iodine Other (See Comments)    Pt is not aware of this allergy, does not recall much info.  . Prednisone Other (See Comments)    sweats  . Lyrica [Pregabalin]     Wobbly, "more than sleepy"  . Benzonatate Itching and Rash  . Sulfa Antibiotics Other (See Comments)    Doesn't remember     Allergies as of 03/24/2017      Reactions   Aspirin Other (See Comments)   sweats   Codeine Other (See Comments)   Iodine Other (See Comments)   Pt is not aware of this allergy, does not recall much info.   Prednisone Other (See Comments)   sweats   Lyrica [pregabalin]    Wobbly, "more than sleepy"   Benzonatate Itching, Rash   Sulfa Antibiotics Other (See Comments)   Doesn't remember      Medication List       Accurate as of 03/24/17 11:30 AM. Always use your most recent med list.          acetaminophen 500 MG tablet Commonly known as:  TYLENOL Take 1 tablet (500 mg total) by mouth every 6 (six) hours as needed for moderate pain.   albuterol 108 (90 Base) MCG/ACT inhaler Commonly known as:  PROVENTIL HFA;VENTOLIN HFA Inhale 2 puffs into the lungs every 6 (six) hours as needed for wheezing.   alum hydroxide-mag trisilicate 81-82 MG Chew chewable tablet Commonly known as:  GAVISCON Chew 1 tablet by mouth 3 (three) times daily as needed for flatulence (bowel spasms).   amLODipine 5 MG tablet Commonly known as:  NORVASC TAKE ONE TABLET BY MOUTH ONCE DAILY   atorvastatin 10 MG tablet  Commonly known as:  LIPITOR Take 1 tablet (10 mg total) by mouth daily.   B COMPLEX PO Take 1 tablet by mouth daily.   bimatoprost 0.01 % Soln Commonly known as:  LUMIGAN Place 1 drop into both eyes at bedtime.   CALTRATE 600+D 600-400 MG-UNIT tablet Generic drug:  Calcium Carbonate-Vitamin D Take 2 tablets by mouth daily.   clopidogrel 75 MG tablet Commonly known as:  PLAVIX TAKE 1 TABLET (75 MG TOTAL) BY MOUTH DAILY.   CoQ10 50 MG Caps Take 50 mg by mouth daily.   docusate sodium 100 MG capsule Commonly known as:  COLACE Take 1 capsule (100 mg total) by mouth daily as needed for mild constipation (to soften stool).   Fish Oil 1200 MG Cpdr Take 1,200 mg by mouth daily. Take one tablet by mouth once daily   fluticasone 50 MCG/ACT nasal spray Commonly known as:   FLONASE Place 1 spray into the nose daily.   loteprednol 0.5 % ophthalmic suspension Commonly known as:  LOTEMAX Place 1 drop into both eyes daily as needed (for dry eyes).   nebivolol 5 MG tablet Commonly known as:  BYSTOLIC Take 1 tablet (5 mg total) by mouth daily.   NONFORMULARY OR COMPOUNDED ITEM APPLY 1 2 GMS TO AFFECTED AREA 3 4 TIMES DAILY AS NEEDED FOR PAIN  THIS IS A COMPOUNDED CREAM 1 PUMP EQUALS 1 GRAM   nystatin cream Commonly known as:  MYCOSTATIN Apply 1 application topically 3 (three) times daily.   pantoprazole 40 MG tablet Commonly known as:  PROTONIX TAKE 1 TABLET (40 MG TOTAL) BY MOUTH DAILY.   RESTASIS 0.05 % ophthalmic emulsion Generic drug:  cycloSPORINE Place 1 drop into both eyes 2 (two) times daily.   risperiDONE 0.5 MG tablet Commonly known as:  RISPERDAL Take 1 tablet (0.5 mg total) by mouth 2 (two) times daily.   SENIOR MULTIVITAMIN PLUS Tabs Take 1 tablet by mouth daily.   SIMBRINZA 1-0.2 % Susp Generic drug:  Brinzolamide-Brimonidine Apply 1 drop to eye 3 (three) times daily.   SYMBICORT 80-4.5 MCG/ACT inhaler Generic drug:  budesonide-formoterol Place 2 puffs into alternate nostrils 2 (two) times daily.   vitamin C 500 MG tablet Commonly known as:  ASCORBIC ACID Take 500 mg by mouth daily.   Vitamin D3 2000 units Tabs Take 2,000 Units by mouth daily.       Review of Systems:  Review of Systems  Constitutional: Positive for malaise/fatigue. Negative for chills and fever.  HENT: Positive for hearing loss.   Eyes:       Glasses  Respiratory: Negative for shortness of breath.   Cardiovascular: Negative for chest pain, palpitations and leg swelling.  Gastrointestinal: Positive for constipation. Negative for abdominal pain, blood in stool, diarrhea and melena.  Genitourinary: Negative for dysuria.  Musculoskeletal: Negative for falls.  Skin: Negative for itching and rash.  Neurological: Negative for dizziness, loss of  consciousness and weakness.  Psychiatric/Behavioral: Positive for hallucinations and memory loss. Negative for depression. The patient is not nervous/anxious and does not have insomnia.     Health Maintenance  Topic Date Due  . MAMMOGRAM  10/07/2018 (Originally 09/28/2009)  . TETANUS/TDAP  10/07/2018 (Originally 10/04/1943)  . INFLUENZA VACCINE  05/07/2017  . DEXA SCAN  Completed  . PNA vac Low Risk Adult  Completed    Physical Exam: Vitals:   03/24/17 1039  BP: 132/61  Pulse: (!) 52  Temp: 97.6 F (36.4 C)  TempSrc: Oral  SpO2: 96%  Weight: 139 lb (63 kg)  Height: 5\' 2"  (1.575 m)   Body mass index is 25.42 kg/m. Physical Exam  Constitutional: She is oriented to person, place, and time. She appears well-developed and well-nourished. No distress.  Cardiovascular: Normal rate, regular rhythm, normal heart sounds and intact distal pulses.   Pulmonary/Chest: Effort normal and breath sounds normal. No respiratory distress.  Abdominal: Soft. Bowel sounds are normal. She exhibits no distension. There is no tenderness.  Musculoskeletal: Normal range of motion.  Neurological: She is alert and oriented to person, place, and time.  Some short term memory loss  Skin: Skin is warm and dry.  Psychiatric: She has a normal mood and affect.    Labs reviewed: Basic Metabolic Panel:  Recent Labs  04/22/16 1118 06/26/16 1056 09/19/16 1415 09/19/16 1425  NA 143 142 140 142  K 3.9 3.7 3.6 3.6  CL 109 110 108 106  CO2 25 24 23   --   GLUCOSE 92 115* 171* 164*  BUN 21 22 11 11   CREATININE 1.04* 0.85 1.00 0.90  CALCIUM 9.2 9.6 9.7  --    Liver Function Tests:  Recent Labs  04/22/16 1118 09/19/16 1415  AST 25 30  ALT 29 23  ALKPHOS 71 78  BILITOT 1.0 1.6*  PROT 6.5 7.4  ALBUMIN 3.9 4.2   No results for input(s): LIPASE, AMYLASE in the last 8760 hours. No results for input(s): AMMONIA in the last 8760 hours. CBC:  Recent Labs  04/22/16 1118 09/19/16 1415  09/19/16 1425  WBC 4.8 5.2  --   NEUTROABS 2,688 3.3  --   HGB 11.2* 12.7 13.3  HCT 34.6* 36.7 39.0  MCV 95.8 90.8  --   PLT 178 198  --    Lipid Panel: No results for input(s): CHOL, HDL, LDLCALC, TRIG, CHOLHDL, LDLDIRECT in the last 8760 hours. Lab Results  Component Value Date   HGBA1C 5.6 04/22/2016    Procedures since last visit: No results found.  Assessment/Plan 1. Benign essential HTN - bp at goal now, cont same regimen and monitor - CBC with Differential/Platelet - COMPLETE METABOLIC PANEL WITH GFR  2. Paranoid type delusional disorder ([place]) -ongoing, memory loss gradually progressing also and likely these are associated--cont risperdal  3. MCI (mild cognitive impairment) with memory loss -cont to monitor functional status--her daughter has begun coming to most appts now  4. Need for shingles vaccine -shingrix sent to the pharmacy for her  5. Pure hypercholesterolemia - cont lipitor therapy - Lipid panel  6. Neuropathy of both feet -did not tolerate gabapentin or lyrica orally, now on a compounded cream prescribed by neurology for the pain with benefit  Labs/tests ordered:   Orders Placed This Encounter  Procedures  . CBC with Differential/Platelet  . COMPLETE METABOLIC PANEL WITH GFR  . Lipid panel   Next appt:  4 mos med mgt  [name] [name] L. Gaynor Ferreras, D.O. [facility] [address], [place] [address] Cell Phone (Mon-Fri 8am-5pm):  [phone] On Call:  [phone] & follow prompts after 5pm & weekends Office Phone:  [phone] Office Fax:  [phone]

## 2017-03-24 NOTE — Progress Notes (Signed)
Subjective:   [name] [name] is a 83 y.o. female who presents for an Initial Medicare Annual Wellness Visit.     Objective:    Today's Vitals   03/24/17 1025  BP: 132/64  Pulse: (!) 52  Temp: 97.6 F (36.4 C)  TempSrc: Oral  SpO2: 96%  Weight: 139 lb (63 kg)  Height: 5\' 2"  (1.575 m)   Body mass index is 25.42 kg/m.   Current Medications (verified) Outpatient Encounter Prescriptions as of 03/24/2017  Medication Sig  . acetaminophen (TYLENOL) 500 MG tablet Take 1 tablet (500 mg total) by mouth every 6 (six) hours as needed for moderate pain.  Marland Kitchen albuterol (PROVENTIL HFA;VENTOLIN HFA) 108 (90 Base) MCG/ACT inhaler Inhale 2 puffs into the lungs every 6 (six) hours as needed for wheezing.  Marland Kitchen alum hydroxide-mag trisilicate (GAVISCON) 09-60 MG CHEW chewable tablet Chew 1 tablet by mouth 3 (three) times daily as needed for flatulence (bowel spasms).  Marland Kitchen amLODipine (NORVASC) 5 MG tablet TAKE ONE TABLET BY MOUTH ONCE DAILY  . atorvastatin (LIPITOR) 10 MG tablet Take 1 tablet (10 mg total) by mouth daily.  . B Complex Vitamins (B COMPLEX PO) Take 1 tablet by mouth daily.  . bimatoprost (LUMIGAN) 0.01 % SOLN Place 1 drop into both eyes at bedtime.  . Brinzolamide-Brimonidine (SIMBRINZA) 1-0.2 % SUSP Apply 1 drop to eye 3 (three) times daily.  . Calcium Carbonate-Vitamin D (CALTRATE 600+D) 600-400 MG-UNIT per tablet Take 2 tablets by mouth daily.  . Cholecalciferol (VITAMIN D3) 2000 units TABS Take 2,000 Units by mouth daily.  . clopidogrel (PLAVIX) 75 MG tablet TAKE 1 TABLET (75 MG TOTAL) BY MOUTH DAILY.  Marland Kitchen Coenzyme Q10 (COQ10) 50 MG CAPS Take 50 mg by mouth daily.  Marland Kitchen docusate sodium (COLACE) 100 MG capsule Take 1 capsule (100 mg total) by mouth daily as needed for mild constipation (to soften stool).  . fluticasone (FLONASE) 50 MCG/ACT nasal spray Place 1 spray into the nose daily.  Marland Kitchen loteprednol (LOTEMAX) 0.5 % ophthalmic suspension Place 1 drop into both eyes daily as needed (for dry  eyes).   . Multiple Vitamins-Minerals (SENIOR MULTIVITAMIN PLUS) TABS Take 1 tablet by mouth daily.   . nebivolol (BYSTOLIC) 5 MG tablet Take 1 tablet (5 mg total) by mouth daily.  . Omega-3 Fatty Acids (FISH OIL) 1200 MG CPDR Take 1,200 mg by mouth daily. Take one tablet by mouth once daily   . pantoprazole (PROTONIX) 40 MG tablet TAKE 1 TABLET (40 MG TOTAL) BY MOUTH DAILY.  Marland Kitchen RESTASIS 0.05 % ophthalmic emulsion Place 1 drop into both eyes 2 (two) times daily.   . risperiDONE (RISPERDAL) 0.5 MG tablet Take 1 tablet (0.5 mg total) by mouth 2 (two) times daily.  . SYMBICORT 80-4.5 MCG/ACT inhaler Place 2 puffs into alternate nostrils 2 (two) times daily.  . vitamin C (ASCORBIC ACID) 500 MG tablet Take 500 mg by mouth daily.  . [DISCONTINUED] gabapentin (NEURONTIN) 100 MG capsule Take 100 mg by mouth 2 (two) times daily.  . [DISCONTINUED] NONFORMULARY OR COMPOUNDED ITEM [facility]  [place] Tendonitis Cream- Diclofenac 3%, Baclofen 2%, Bupivacaine 1%, Doxepin 5%, Gabapentin 6%, Ibuprofen 3%, Pentoxifylline 3% Apply 1-2 grams to affected area 3-4 times daily Qty. 120 gm 3 refills  . [DISCONTINUED] nystatin cream (MYCOSTATIN) APPLY 1 APPLICATION TOPICALLY 3 (THREE) TIMES DAILY.  . [DISCONTINUED] pregabalin (LYRICA) 75 MG capsule Take one capsule by mouth twice daily   No facility-administered encounter medications on file as of 03/24/2017.  Allergies (verified) Aspirin; Codeine; Iodine; Prednisone; Lyrica [pregabalin]; Benzonatate; and Sulfa antibiotics   History: Past Medical History:  Diagnosis Date  . Anxiety   . Asthma   . Benign essential hypertension   . Bradycardia, drug induced 06/11/2016  . Edema extremities   . GERD (gastroesophageal reflux disease)   . Glaucoma   . Osteoarthritis   . PVD (peripheral vascular disease) ([place])   . Seasonal allergies   . Stroke [facility])    Past Surgical History:  Procedure Laterality Date  . ABDOMINAL HYSTERECTOMY  1981  .  TONSILLECTOMY  1953   Family History  Problem Relation Age of Onset  . Stroke Father   . Hypertension Father   . Heart disease Mother   . Hypertension Mother   . Diabetes Mother    Social History   Occupational History  . Not on file.   Social History Main Topics  . Smoking status: Never Smoker  . Smokeless tobacco: Never Used  . Alcohol use No  . Drug use: No  . Sexual activity: Not Currently    Tobacco Counseling Counseling given: Not Answered   Activities of Daily Living In your present state of health, do you have any difficulty performing the following activities: 03/24/2017 04/23/2016  Hearing? N -  Vision? Y -  Difficulty concentrating or making decisions? Y -  Walking or climbing stairs? Y (No Data)  Dressing or bathing? N -  Doing errands, shopping? Y (No Data)  Preparing Food and eating ? N -  Using the Toilet? N -  In the past six months, have you accidently leaked urine? N -  Do you have problems with loss of bowel control? N -  Managing your Medications? N -  Managing your Finances? N -  Housekeeping or managing your Housekeeping? N -  Some recent data might be hidden    Immunizations and Health Maintenance Immunization History  Administered Date(s) Administered  . Influenza,inj,Quad PF,36+ Mos 06/11/2013, 06/30/2014, 07/17/2015, 06/27/2016  . Influenza-Unspecified 07/07/2012  . Pneumococcal Conjugate-13 11/07/2014  . Pneumococcal Polysaccharide-23 10/08/2007   There are no preventive care reminders to display for this patient.  Patient Care Team: Gayland Curry, DO as PCP - General (Geriatric Medicine)  Indicate any recent Medical Services you may have received from other than [facility] providers in the past year (date may be approximate).     Assessment:   This is a routine wellness examination for [name] [name].   Hearing/Vision screen No exam data present  Dietary issues and exercise activities discussed: Current Exercise Habits: The patient does  not participate in regular exercise at present, Exercise limited by: None identified  Goals    . Walking          Pt would like to walk with someone 10 days out of the month      Depression Screen PHQ 2/9 Scores 03/24/2017 12/23/2016 07/26/2016 04/25/2016 04/12/2016 02/26/2016 01/08/2016  PHQ - 2 Score 0 0 0 0 0 0 0    Fall Risk Fall Risk  03/24/2017 02/17/2017 12/23/2016 11/13/2016 07/26/2016  Falls in the past year? No No No No No    Cognitive Function: MMSE - Mini Mental State Exam 03/24/2017 01/10/2015 06/11/2013  Orientation to time 4 4 5   Orientation to Place 5 5 5   Registration 3 3 3   Attention/ Calculation 4 4 5   Recall 1 2 2   Language- name 2 objects 2 2 2   Language- repeat 1 1 1   Language- follow 3 step command 2  3 3  Language- read & follow direction 1 1 1   Write a sentence - 1 1  Copy design - 1 1  Total score - 27 29        Screening Tests Health Maintenance  Topic Date Due  . MAMMOGRAM  10/07/2018 (Originally 09/28/2009)  . TETANUS/TDAP  10/07/2018 (Originally 10/04/1943)  . INFLUENZA VACCINE  05/07/2017  . DEXA SCAN  Completed  . PNA vac Low Risk Adult  Completed      Plan:    I have personally reviewed and addressed the Medicare Annual Wellness questionnaire and have noted the following in the patient's chart:  A. Medical and social history B. Use of alcohol, tobacco or illicit drugs  C. Current medications and supplements D. Functional ability and status E.  Nutritional status F.  Physical activity G. Advance directives H. List of other physicians I.  Hospitalizations, surgeries, and ER visits in previous 12 months J.  Holiday [place] to include hearing, vision, cognitive, depression L. Referrals and appointments - none  In addition, I have reviewed and discussed with patient certain preventive protocols, quality metrics, and best practice recommendations. A written personalized care plan for preventive services as well as general preventive health  recommendations were provided to patient.  See attached scanned questionnaire for additional information.   Signed,   Rich Reining, RN Nurse Health Advisor  Quick Notes   Health Maintenance: TDAP and shingles due.     Abnormal Screen: MMSE 25/30. Passed clock drawing     Patient Concerns:      Nurse Concerns: Lyrica d/c due to pt being overly drowsy     I reviewed health advisor's note, was available for consultation and agree with the assessment and plan as written.    Tiffany L. Reed, D.O. [facility] [address], [place] [address] Cell Phone (Mon-Fri 8am-5pm):  [phone] On Call:  [phone] & follow prompts after 5pm & weekends Office Phone:  [phone] Office Fax:  [phone]

## 2017-03-24 NOTE — Patient Instructions (Addendum)
[name] [name] , Thank you for taking time to come for your Medicare Wellness Visit. I appreciate your ongoing commitment to your health goals. Please review the following plan we discussed and let me know if I can assist you in the future.   Screening recommendations/referrals: Colonoscopy up to date, pt over age 81 Mammogram up to date, pt over age 55 Bone Density up to date Recommended yearly ophthalmology/optometry visit for glaucoma screening and checkup Recommended yearly dental visit for hygiene and checkup  Vaccinations: Influenza vaccine up to date. Due 06/27/2017 Pneumococcal vaccine up to date Tdap vaccine due. If you decide you want this, we will send over a prescription to your pharmacy. Shingles vaccine due. If you decide you want this, we will send over a prescription to your pharmacy.    Advanced directives: Please bring [place] a copy next time you come  Conditions/risks identified: None  Next appointment: [name] 03/24/17 @ 11am   Preventive Care 65 Years and Older, Female Preventive care refers to lifestyle choices and visits with your health care provider that can promote health and wellness. What does preventive care include?  A yearly physical exam. This is also called an annual well check.  Dental exams once or twice a year.  Routine eye exams. Ask your health care provider how often you should have your eyes checked.  Personal lifestyle choices, including:  Daily care of your teeth and gums.  Regular physical activity.  Eating a healthy diet.  Avoiding tobacco and drug use.  Limiting alcohol use.  Practicing safe sex.  Taking low-dose aspirin every day.  Taking vitamin and mineral supplements as recommended by your health care provider. What happens during an annual well check? The services and screenings done by your health care provider during your annual well check will depend on your age, overall health, lifestyle risk factors, and family history  of disease. Counseling  Your health care provider may ask you questions about your:  Alcohol use.  Tobacco use.  Drug use.  Emotional well-being.  Home and relationship well-being.  Sexual activity.  Eating habits.  History of falls.  Memory and ability to understand (cognition).  Work and work Statistician.  Reproductive health. Screening  You may have the following tests or measurements:  Height, weight, and BMI.  Blood pressure.  Lipid and cholesterol levels. These may be checked every 5 years, or more frequently if you are over 67 years old.  Skin check.  Lung cancer screening. You may have this screening every year starting at age 109 if you have a 30-pack-year history of smoking and currently smoke or have quit within the past 15 years.  Fecal occult blood test (FOBT) of the stool. You may have this test every year starting at age 13.  Flexible sigmoidoscopy or colonoscopy. You may have a sigmoidoscopy every 5 years or a colonoscopy every 10 years starting at age 24.  Hepatitis C blood test.  Hepatitis B blood test.  Sexually transmitted disease (STD) testing.  Diabetes screening. This is done by checking your blood sugar (glucose) after you have not eaten for a while (fasting). You may have this done every 1-3 years.  Bone density scan. This is done to screen for osteoporosis. You may have this done starting at age 27.  Mammogram. This may be done every 1-2 years. Talk to your health care provider about how often you should have regular mammograms. Talk with your health care provider about your test results, treatment options, and  if necessary, the need for more tests. Vaccines  Your health care provider may recommend certain vaccines, such as:  Influenza vaccine. This is recommended every year.  Tetanus, diphtheria, and acellular pertussis (Tdap, Td) vaccine. You may need a Td booster every 10 years.  Zoster vaccine. You may need this after age  38.  Pneumococcal 13-valent conjugate (PCV13) vaccine. One dose is recommended after age 5.  Pneumococcal polysaccharide (PPSV23) vaccine. One dose is recommended after age 61. Talk to your health care provider about which screenings and vaccines you need and how often you need them. This information is not intended to replace advice given to you by your health care provider. Make sure you discuss any questions you have with your health care provider. Document Released: 10/20/2015 Document Revised: 06/12/2016 Document Reviewed: 07/25/2015 Elsevier Interactive Patient Education  2017 [place] Prevention in the Home Falls can cause injuries. They can happen to people of all ages. There are many things you can do to make your home safe and to help prevent falls. What can I do on the outside of my home?  Regularly fix the edges of walkways and driveways and fix any cracks.  Remove anything that might make you trip as you walk through a door, such as a raised step or threshold.  Trim any bushes or trees on the path to your home.  Use bright outdoor lighting.  Clear any walking paths of anything that might make someone trip, such as rocks or tools.  Regularly check to see if handrails are loose or broken. Make sure that both sides of any steps have handrails.  Any raised decks and porches should have guardrails on the edges.  Have any leaves, snow, or ice cleared regularly.  Use sand or salt on walking paths during winter.  Clean up any spills in your garage right away. This includes oil or grease spills. What can I do in the bathroom?  Use night lights.  Install grab bars by the toilet and in the tub and shower. Do not use towel bars as grab bars.  Use non-skid mats or decals in the tub or shower.  If you need to sit down in the shower, use a plastic, non-slip stool.  Keep the floor dry. Clean up any water that spills on the floor as soon as it happens.  Remove  soap buildup in the tub or shower regularly.  Attach bath mats securely with double-sided non-slip rug tape.  Do not have throw rugs and other things on the floor that can make you trip. What can I do in the bedroom?  Use night lights.  Make sure that you have a light by your bed that is easy to reach.  Do not use any sheets or blankets that are too big for your bed. They should not hang down onto the floor.  Have a firm chair that has side arms. You can use this for support while you get dressed.  Do not have throw rugs and other things on the floor that can make you trip. What can I do in the kitchen?  Clean up any spills right away.  Avoid walking on wet floors.  Keep items that you use a lot in easy-to-reach places.  If you need to reach something above you, use a strong step stool that has a grab bar.  Keep electrical cords out of the way.  Do not use floor polish or wax that makes floors slippery. If you  must use wax, use non-skid floor wax.  Do not have throw rugs and other things on the floor that can make you trip. What can I do with my stairs?  Do not leave any items on the stairs.  Make sure that there are handrails on both sides of the stairs and use them. Fix handrails that are broken or loose. Make sure that handrails are as long as the stairways.  Check any carpeting to make sure that it is firmly attached to the stairs. Fix any carpet that is loose or worn.  Avoid having throw rugs at the top or bottom of the stairs. If you do have throw rugs, attach them to the floor with carpet tape.  Make sure that you have a light switch at the top of the stairs and the bottom of the stairs. If you do not have them, ask someone to add them for you. What else can I do to help prevent falls?  Wear shoes that:  Do not have high heels.  Have rubber bottoms.  Are comfortable and fit you well.  Are closed at the toe. Do not wear sandals.  If you use a  stepladder:  Make sure that it is fully opened. Do not climb a closed stepladder.  Make sure that both sides of the stepladder are locked into place.  Ask someone to hold it for you, if possible.  Clearly mark and make sure that you can see:  Any grab bars or handrails.  First and last steps.  Where the edge of each step is.  Use tools that help you move around (mobility aids) if they are needed. These include:  Canes.  Walkers.  Scooters.  Crutches.  Turn on the lights when you go into a dark area. Replace any light bulbs as soon as they burn out.  Set up your furniture so you have a clear path. Avoid moving your furniture around.  If any of your floors are uneven, fix them.  If there are any pets around you, be aware of where they are.  Review your medicines with your doctor. Some medicines can make you feel dizzy. This can increase your chance of falling. Ask your doctor what other things that you can do to help prevent falls. This information is not intended to replace advice given to you by your health care provider. Make sure you discuss any questions you have with your health care provider. Document Released: 07/20/2009 Document Revised: 02/29/2016 Document Reviewed: 10/28/2014 Elsevier Interactive Patient Education  2017 [employer].

## 2017-03-25 LAB — COMPLETE METABOLIC PANEL WITH GFR
ALT: 16 U/L (ref 6–29)
AST: 18 U/L (ref 10–35)
Albumin: 4 g/dL (ref 3.6–5.1)
Alkaline Phosphatase: 75 U/L (ref 33–130)
BUN: 18 mg/dL (ref 7–25)
CO2: 23 mmol/L (ref 20–31)
Calcium: 9.1 mg/dL (ref 8.6–10.4)
Chloride: 112 mmol/L — ABNORMAL HIGH (ref 98–110)
Creat: 0.81 mg/dL (ref 0.60–0.88)
GFR, Est African American: 74 mL/min (ref >=60)
GFR, Est Non African American: 64 mL/min (ref >=60)
Glucose, Bld: 97 mg/dL (ref 65–99)
Potassium: 3.7 mmol/L (ref 3.5–5.3)
Sodium: 143 mmol/L (ref 135–146)
Total Bilirubin: 0.9 mg/dL (ref 0.2–1.2)
Total Protein: 6.5 g/dL (ref 6.1–8.1)

## 2017-03-25 LAB — LIPID PANEL
Cholesterol: 159 mg/dL (ref <200)
HDL: 109 mg/dL (ref >50)
LDL Cholesterol: 41 mg/dL (ref <100)
Total CHOL/HDL Ratio: 1.5 Ratio (ref <5.0)
Triglycerides: 46 mg/dL (ref <150)
VLDL: 9 mg/dL (ref <30)

## 2017-03-26 ENCOUNTER — Ambulatory Visit: Payer: Medicare Other | Admitting: Podiatry

## 2017-03-27 ENCOUNTER — Encounter: Payer: Self-pay | Admitting: *Deleted

## 2017-03-31 ENCOUNTER — Telehealth: Payer: Self-pay | Admitting: Podiatry

## 2017-03-31 NOTE — Telephone Encounter (Signed)
Patients daughter Katharine Look called stating that the cream Dr. Jacqualyn Posey prescribed is really working but she will be out before her next appointment scheduled for 09 July. Wants to know if Dr. Jacqualyn Posey can refill prescription. If not, could you please call my mother at [phone] to let her know why it could not be refilled. Thank you.

## 2017-04-01 MED ORDER — NONFORMULARY OR COMPOUNDED ITEM: 5 refills | Status: DC

## 2017-04-01 NOTE — Telephone Encounter (Signed)
Dr. Berton Lan the refill of the Peripheral Neuropathy cream. Faxed to [facility].

## 2017-04-14 ENCOUNTER — Encounter: Payer: Self-pay | Admitting: Podiatry

## 2017-04-14 ENCOUNTER — Ambulatory Visit (INDEPENDENT_AMBULATORY_CARE_PROVIDER_SITE_OTHER): Payer: Medicare Other | Admitting: Podiatry

## 2017-04-14 DIAGNOSIS — M79672 Pain in left foot: Secondary | ICD-10-CM

## 2017-04-14 DIAGNOSIS — I6340 Cerebral infarction due to embolism of unspecified cerebral artery: Secondary | ICD-10-CM | POA: Diagnosis not present

## 2017-04-14 DIAGNOSIS — G5793 Unspecified mononeuropathy of bilateral lower limbs: Secondary | ICD-10-CM | POA: Diagnosis not present

## 2017-04-14 DIAGNOSIS — M779 Enthesopathy, unspecified: Secondary | ICD-10-CM | POA: Diagnosis not present

## 2017-04-14 DIAGNOSIS — M79671 Pain in right foot: Secondary | ICD-10-CM

## 2017-04-15 ENCOUNTER — Encounter: Payer: Self-pay | Admitting: Neurology

## 2017-04-15 ENCOUNTER — Ambulatory Visit (INDEPENDENT_AMBULATORY_CARE_PROVIDER_SITE_OTHER): Payer: Medicare Other | Admitting: Neurology

## 2017-04-15 ENCOUNTER — Ambulatory Visit (INDEPENDENT_AMBULATORY_CARE_PROVIDER_SITE_OTHER): Payer: Self-pay | Admitting: Neurology

## 2017-04-15 DIAGNOSIS — G5793 Unspecified mononeuropathy of bilateral lower limbs: Secondary | ICD-10-CM | POA: Diagnosis not present

## 2017-04-15 NOTE — Procedures (Signed)
     HISTORY:  [name] [name] is a 83 year old patient with a six-month history of numbness involving the hands and feet. The patient denies any neck pain or low back pain or pain radiating down the arms or legs. She is being evaluated for a possible neuropathy.  NERVE CONDUCTION STUDIES:  Nerve conduction studies were performed on both upper extremities. The distal motor latencies for the median nerves were prolonged bilaterally with low motor amplitudes seen for these nerves bilaterally. The distal motor latencies for the ulnar nerves were also prolonged bilaterally with low motor amplitudes. Nerve conduction velocities for the median and ulnar nerves were slowed bilaterally. The sensory latencies for the median nerves were prolonged bilaterally and were prolonged for the right ulnar sensory latency, normal for the left ulnar sensory latency. The F wave latencies for the ulnar nerves were prolonged bilaterally.  Nerve conduction studies were performed on the right lower extremity. The distal motor latency for the right peroneal nerve is normal, prolonged for the right posterior tibial nerve. The motor amplitudes for the right peroneal and posterior tibial nerves were low with slowing seen for these nerves as well. The sural and peroneal sensory latencies were within normal limits with slightly low amplitudes for these latencies. The F wave latency for the right posterior tibial nerve was prolonged.  EMG STUDIES:  EMG study was performed on the right lower extremity:  The tibialis anterior muscle reveals 2 to 4K motor units with full recruitment. No fibrillations or positive waves were seen. The peroneus tertius muscle reveals 2 to 5K motor units with decreased recruitment. No fibrillations or positive waves were seen. The medial gastrocnemius muscle reveals 1 to 3K motor units with full recruitment. No fibrillations or positive waves were seen. The vastus lateralis muscle reveals 2 to 4K motor  units with full recruitment. No fibrillations or positive waves were seen. The iliopsoas muscle reveals 2 to 4K motor units with full recruitment. No fibrillations or positive waves were seen. The biceps femoris muscle (long head) reveals 2 to 4K motor units with full recruitment. No fibrillations or positive waves were seen. The lumbosacral paraspinal muscles were tested at 3 levels, and revealed no abnormalities of insertional activity at all 3 levels tested. There was good relaxation.   IMPRESSION:  Nerve conduction studies done on both upper extremities and the right lower extremity shows evidence of a generalized primarily axonal peripheral neuropathy of moderate severity. There is evidence of bilateral carpal tunnel syndrome. EMG evaluation of the right lower extremity shows some mild chronic stable signs of denervation consistent with the diagnosis of peripheral neuropathy. There is no evidence of an overlying lumbosacral radiculopathy.  [name] Alexanders MD 04/15/2017 11:41 AM  [facility] [address] [place], [place] [address]  Phone [phone] Fax [phone]

## 2017-04-15 NOTE — Progress Notes (Signed)
Please refer to EMG and nerve conduction study procedure note. 

## 2017-04-16 ENCOUNTER — Telehealth: Payer: Self-pay | Admitting: Neurology

## 2017-04-16 NOTE — Telephone Encounter (Signed)
Called pt and delivered EMG/NCS result to her. She has generalized primary axonal polyneuropathy which is consistent with her symptoms. She stated that she can not tolerate gabapentin or lyrica due to sleepy, confusion, and dizziness side effects. She is on compound cream now for the feet, some effectiveness but not to the point she would like to be. I asked her to use the cream in her hands too. Give some time, if still not effective, she should call back and we will see her again to discuss about cymbalta. She expressed understanding and appreciation.   Rosalin Hawking, MD PhD Stroke Neurology 04/16/2017 9:21 AM

## 2017-04-18 NOTE — Progress Notes (Signed)
Subjective: 83 year old female presents the office today for concerns of bilateral foot pain. She states that she is doing better and a compound cream to ordered her has helped. She denies any increase in swelling or redness. She denies any acute changes since last appointment she has no other concerns. Denies any systemic complaints such as fevers, chills, nausea, vomiting. No acute changes since last appointment, and no other complaints at this time.   Objective: AAO x3, NAD DP/PT pulses palpable bilaterally, CRT less than 3 seconds; she denies any claudication symptoms.  Overall his sensation appears to be intact with Derrel Nip monofilament.  There is minimal tenderness along the medial aspect of the ankle on the course of posterior tibial tendon bilaterally. There is no discomfort on the plantar aspect of the foot on the arch of the foot along the plantar fascia. There is no specific area pinpoint bony tenderness or pain the vibratory sensation.  No open lesions or pre-ulcerative lesions.  No pain with calf compression, swelling, warmth, erythema  Assessment: 83 year old female bilateral foot pain likely tendinitis which has improved.   Plan: -All treatment options discussed with the patient including all alternatives, risks, complications.  -This point discussed with her continues with shoe gear and also topical anti-inflammatory cream which is been helping. She can do, ice of her daughter helps her do this that she does not freeze her skin. -Daily foot inspection discussed -RTC 9 weeks. Call any questions or concerns.  Celesta Gentile, DPM

## 2017-05-05 ENCOUNTER — Telehealth: Payer: Self-pay | Admitting: *Deleted

## 2017-05-05 NOTE — Telephone Encounter (Signed)
Patient notified and agreed.  

## 2017-05-05 NOTE — Telephone Encounter (Signed)
Patient called and stated that she has had diarrhea since 11:00pm last night. [name] [name] has it this morning but in the last hour has gotten better. No abdominal pains. No fever. Vomited 1 time. No more Nausea. Patient feels better but wants to know what to eat. I instructed her to eat a bland diet and gave her examples and also instructed her to drink plenty of fluids.  Please Advise.

## 2017-05-05 NOTE — Telephone Encounter (Signed)
Sounds like good advice.  If the diarrhea returns and persists or she's unable to keep anything down, she should call back for further instructions.  She should also keep her daughter aware of what's going on.

## 2017-05-06 ENCOUNTER — Other Ambulatory Visit: Payer: Self-pay

## 2017-05-06 NOTE — Patient Outreach (Signed)
[facility]) Care Management  05/06/2017  [name] [name] [birth year] [id number]   Telephone Screen  Referral Date: 05/05/17 Referral Source: Nurse Call Center Report Referral Reason: "caller states she is having diarrhea that started around 11pm, vomited x1 at 6am a small amount. Has had diarrhea several times. She is urinating." Insurance: Medicare  Outreach attempt #1 to patient. Spoke with patient. She states she is feeling "much better today/" Patient voices that diarrhea stopped yesterday evening around 5-6pm. She has had no further GI symptoms. She has been adhering to bland diet which has helped. Patient voices she is drinking plenty of fluids to replenish fluids loss during diarrhea episodes. She did call PCP on yesterday as directed by [place]. She is aware to seek medical attention if symptoms arise again and/or worsen. She voiced understanding. She has no further RN CM needs or concerns at this time. Patient advised to contact 24 hr Nurse Line as needed. She voiced understanding and was appreciative of f/u call.       Plan: RN CM will notify [facility] administrative assistant of case status.   Enzo Montgomery, RN,BSN,CCM [facility] Telephonic Care Management Coordinator Direct Phone: [phone] Toll Free: [phone] Fax: [phone]

## 2017-05-07 ENCOUNTER — Other Ambulatory Visit: Payer: Self-pay | Admitting: Internal Medicine

## 2017-05-07 ENCOUNTER — Other Ambulatory Visit: Payer: Self-pay | Admitting: Cardiology

## 2017-05-07 DIAGNOSIS — H16223 Keratoconjunctivitis sicca, not specified as Sjogren's, bilateral: Secondary | ICD-10-CM | POA: Diagnosis not present

## 2017-05-07 DIAGNOSIS — H43812 Vitreous degeneration, left eye: Secondary | ICD-10-CM | POA: Diagnosis not present

## 2017-05-07 DIAGNOSIS — H401133 Primary open-angle glaucoma, bilateral, severe stage: Secondary | ICD-10-CM | POA: Diagnosis not present

## 2017-05-07 DIAGNOSIS — H02403 Unspecified ptosis of bilateral eyelids: Secondary | ICD-10-CM | POA: Diagnosis not present

## 2017-05-12 ENCOUNTER — Other Ambulatory Visit: Payer: Self-pay | Admitting: Cardiology

## 2017-05-12 NOTE — Telephone Encounter (Signed)
Medication Detail    Disp Refills Start End   BYSTOLIC 5 MG tablet 30 tablet 1 05/07/2017    Sig: TAKE 1 TABLET EVERY DAY   Sent to pharmacy as: BYSTOLIC 5 MG tablet   Notes to Pharmacy: Please call our office to schedule an yearly appointment with Dr. Radford Pax for September for future refills. [phone]. Thank you 1st attempt   E-Prescribing Status: Receipt confirmed by pharmacy (05/07/2017 9:41 AM EDT)   Pharmacy   [id number] - [place], [place]

## 2017-05-22 ENCOUNTER — Other Ambulatory Visit: Payer: Self-pay | Admitting: Internal Medicine

## 2017-05-22 DIAGNOSIS — K219 Gastro-esophageal reflux disease without esophagitis: Secondary | ICD-10-CM

## 2017-06-08 ENCOUNTER — Other Ambulatory Visit: Payer: Self-pay | Admitting: Cardiology

## 2017-06-19 ENCOUNTER — Ambulatory Visit: Payer: Medicare Other | Admitting: Podiatry

## 2017-06-26 ENCOUNTER — Encounter: Payer: Self-pay | Admitting: Podiatry

## 2017-06-26 ENCOUNTER — Ambulatory Visit (INDEPENDENT_AMBULATORY_CARE_PROVIDER_SITE_OTHER): Payer: Medicare Other | Admitting: Podiatry

## 2017-06-26 DIAGNOSIS — M79676 Pain in unspecified toe(s): Secondary | ICD-10-CM | POA: Diagnosis not present

## 2017-06-26 DIAGNOSIS — Q828 Other specified congenital malformations of skin: Secondary | ICD-10-CM | POA: Diagnosis not present

## 2017-06-26 DIAGNOSIS — B351 Tinea unguium: Secondary | ICD-10-CM | POA: Diagnosis not present

## 2017-06-26 DIAGNOSIS — G5793 Unspecified mononeuropathy of bilateral lower limbs: Secondary | ICD-10-CM

## 2017-06-27 NOTE — Progress Notes (Signed)
Subjective: 83 y.o. returns the office today for painful, elongated, thickened toenails which she cannot trim herself. Denies any redness or drainage around the nails. She also has painful calluses. Otherwise she states that her feet are doing  "wonderful" after getting the cream for her feet and is very thankful we found something topical that helps. Denies any acute changes since last appointment and no new complaints today. Denies any systemic complaints such as fevers, chills, nausea, vomiting.   Objective: AAO 3, NAD DP/PT pulses palpable, CRT less than 3 seconds Nails hypertrophic, dystrophic, elongated, brittle, discolored 10. There is tenderness overlying the nails 1-5 bilaterally. There is no surrounding erythema or drainage along the nail sites. Hyperkeratotic lesions right submetatarsal 1 and left lateral fourth toe. After debridement there is no underlying ulceration, drainage or any signs of infection No open lesions or pre-ulcerative lesions are identified. No other areas of tenderness bilateral lower extremities. No overlying edema, erythema, increased warmth. No pain with calf compression, swelling, warmth, erythema.  Assessment: Patient presents with symptomatic onychomycosis; porokeratosis; neuropathy   Plan: -Treatment options including alternatives, risks, complications were discussed -Nails sharply debrided 10 without complication/bleeding. -Hyperkeratotic lesions are sharply debrided 2 without any complications or bleeding -Discussed daily foot inspection. If there are any changes, to call the office immediately.  -Follow-up in 3 months or sooner if any problems are to arise. In the meantime, encouraged to call the office with any questions, concerns, changes symptoms.  Celesta Gentile, DPM

## 2017-07-08 ENCOUNTER — Other Ambulatory Visit: Payer: Self-pay | Admitting: Internal Medicine

## 2017-07-13 ENCOUNTER — Other Ambulatory Visit: Payer: Self-pay | Admitting: Cardiology

## 2017-07-13 ENCOUNTER — Other Ambulatory Visit: Payer: Self-pay | Admitting: Internal Medicine

## 2017-07-16 ENCOUNTER — Telehealth: Payer: Self-pay | Admitting: *Deleted

## 2017-07-16 ENCOUNTER — Telehealth: Payer: Self-pay

## 2017-07-16 ENCOUNTER — Ambulatory Visit (INDEPENDENT_AMBULATORY_CARE_PROVIDER_SITE_OTHER): Payer: Medicare Other | Admitting: Nurse Practitioner

## 2017-07-16 ENCOUNTER — Encounter: Payer: Self-pay | Admitting: Nurse Practitioner

## 2017-07-16 VITALS — BP 120/74 | HR 63 | Temp 98.5°F | Resp 17 | Ht 62.0 in | Wt 140.8 lb

## 2017-07-16 DIAGNOSIS — H6122 Impacted cerumen, left ear: Secondary | ICD-10-CM | POA: Diagnosis not present

## 2017-07-16 DIAGNOSIS — F22 Delusional disorders: Secondary | ICD-10-CM | POA: Diagnosis not present

## 2017-07-16 DIAGNOSIS — Z23 Encounter for immunization: Secondary | ICD-10-CM

## 2017-07-16 DIAGNOSIS — K219 Gastro-esophageal reflux disease without esophagitis: Secondary | ICD-10-CM

## 2017-07-16 DIAGNOSIS — R61 Generalized hyperhidrosis: Secondary | ICD-10-CM | POA: Diagnosis not present

## 2017-07-16 DIAGNOSIS — I6340 Cerebral infarction due to embolism of unspecified cerebral artery: Secondary | ICD-10-CM | POA: Diagnosis not present

## 2017-07-16 MED ORDER — PANTOPRAZOLE SODIUM 40 MG PO TBEC: 40.0000 mg | DELAYED_RELEASE_TABLET | Freq: Every day | ORAL | 5 refills | Status: DC

## 2017-07-16 NOTE — Telephone Encounter (Signed)
Received fax from [place] for Prior Authorization for Pantoprazole 40mg .  Initiated through [employer] [facility] [employer]. Key:[id number] Awaiting questionnaire to be sent through website.  Member VV#:[id number]

## 2017-07-16 NOTE — Telephone Encounter (Signed)
Per Sherrie Mustache, NP contact patient to inquire if she is having any pulmonary symptoms and if yes have patient get chest xray prior to appointment today.  I called patient, patient denies cough, SOB, congestion, and fever. Patient with 1 episode of spitting up blood 1 month ago and last episode of night sweats 2 days ago.  Per Janett Billow no Xray needed prior, patient to be seen at 3:15 pm today.

## 2017-07-16 NOTE — Progress Notes (Signed)
Careteam: Patient Care Team: Gayland Curry, DO as PCP - General (Geriatric Medicine) Trula Slade, DPM as Consulting Physician (Podiatry)  Advanced Directive information Does Patient Have a Medical Advance Directive?: No  Allergies  Allergen Reactions  . Aspirin Other (See Comments)    sweats  . Codeine Other (See Comments)  . Iodine Other (See Comments)    Pt is not aware of this allergy, does not recall much info.  . Prednisone Other (See Comments)    sweats  . Lyrica [Pregabalin]     Wobbly, "more than sleepy"  . Benzonatate Itching and Rash  . Sulfa Antibiotics Other (See Comments)    Doesn't remember    Chief Complaint  Patient presents with  . Acute Visit    Pt is being seen for night sweats and swollen glands (am only). Pt reports no pain or difficulty swollowing. Pt had one episode of spitting up dark brown blood.   . Other    Daughter, Katharine Look, in room     HPI: Patient is a 83 y.o. female seen in the office today due to swollen glands and night sweats. Not swollen today.  Reports night sweats and swollen glands started about 1 month ago. She woke up one morning and the gland was swollen under her chin. It went away.  Then she cleared her throat one morning and spit up (after she brushed her teeth) a dark colored spit, nothing since and this was a month ago.   Has been feeling for gland under chin and sometimes it will be there and other times it will not. No ear pain. Went to the dentist and her teeth look good. He could not find the gland.   No fevers noted. Overall feels well. Does not feel sick. Reports good appetite. No weight loss.   Night sweats- will notice that her body is moist, ?if she goes to bed with too much covers on her. Sleeps with multiple layers and wears winter pajamas. Keeps the house on 80.      Review of Systems:  Review of Systems  Constitutional: Negative for chills, fever and malaise/fatigue.  HENT: Positive for hearing  loss. Negative for congestion, ear discharge, ear pain, sinus pain and sore throat.   Eyes:       Glasses  Respiratory: Negative for cough and shortness of breath.   Cardiovascular: Negative for chest pain and palpitations.  Gastrointestinal: Negative for melena.  Skin: Negative for itching and rash.  Neurological: Negative for dizziness, loss of consciousness, weakness and headaches.  Psychiatric/Behavioral: Positive for hallucinations and memory loss. Negative for depression. The patient is not nervous/anxious and does not have insomnia.     Past Medical History:  Diagnosis Date  . Anxiety   . Asthma   . Benign essential hypertension   . Bradycardia, drug induced 06/11/2016  . Edema extremities   . GERD (gastroesophageal reflux disease)   . Glaucoma   . Osteoarthritis   . PVD (peripheral vascular disease) ([place])   . Seasonal allergies   . Stroke [facility])    Past Surgical History:  Procedure Laterality Date  . ABDOMINAL HYSTERECTOMY  1981  . TONSILLECTOMY  1953   Social History:   reports that she has never smoked. She has never used smokeless tobacco. She reports that she does not drink alcohol or use drugs.  Family History  Problem Relation Age of Onset  . Stroke Father   . Hypertension Father   . Heart disease Mother   .  Hypertension Mother   . Diabetes Mother     Medications: Patient's Medications  New Prescriptions   No medications on file  Previous Medications   ACETAMINOPHEN (TYLENOL) 500 MG TABLET    Take 1 tablet (500 mg total) by mouth every 6 (six) hours as needed for moderate pain.   ALBUTEROL (PROVENTIL HFA;VENTOLIN HFA) 108 (90 BASE) MCG/ACT INHALER    Inhale 2 puffs into the lungs every 6 (six) hours as needed for wheezing.   ALUM HYDROXIDE-MAG TRISILICATE (GAVISCON) 09-47 MG CHEW CHEWABLE TABLET    Chew 1 tablet by mouth 3 (three) times daily as needed for flatulence (bowel spasms).   AMLODIPINE (NORVASC) 5 MG TABLET    TAKE ONE TABLET BY MOUTH ONCE DAILY     ATORVASTATIN (LIPITOR) 10 MG TABLET    Take 1 tablet (10 mg total) by mouth daily.   B COMPLEX VITAMINS (B COMPLEX PO)    Take 1 tablet by mouth daily.   BIMATOPROST (LUMIGAN) 0.01 % SOLN    Place 1 drop into both eyes at bedtime.   BRINZOLAMIDE-BRIMONIDINE (SIMBRINZA) 1-0.2 % SUSP    Apply 1 drop to eye 3 (three) times daily.   BYSTOLIC 5 MG TABLET    TAKE 1 TABLET EVERY DAY   CALCIUM CARBONATE-VITAMIN D (CALTRATE 600+D) 600-400 MG-UNIT PER TABLET    Take 2 tablets by mouth daily.   CHOLECALCIFEROL (VITAMIN D3) 2000 UNITS TABS    Take 2,000 Units by mouth daily.   CLOPIDOGREL (PLAVIX) 75 MG TABLET    TAKE 1 TABLET (75 MG TOTAL) BY MOUTH DAILY.   COENZYME Q10 (COQ10) 50 MG CAPS    Take 50 mg by mouth daily.   DOCUSATE SODIUM (COLACE) 100 MG CAPSULE    Take 1 capsule (100 mg total) by mouth daily as needed for mild constipation (to soften stool).   FLUTICASONE (FLONASE) 50 MCG/ACT NASAL SPRAY    Place 1 spray into the nose daily.   LOTEPREDNOL (LOTEMAX) 0.5 % OPHTHALMIC SUSPENSION    Place 1 drop into both eyes daily as needed (for dry eyes).    MULTIPLE VITAMINS-MINERALS (SENIOR MULTIVITAMIN PLUS) TABS    Take 1 tablet by mouth daily.    NEBIVOLOL (BYSTOLIC) 5 MG TABLET    Please keep upcoming appointment for future refills, thank you!   NONFORMULARY OR COMPOUNDED ITEM    APPLY 1 2 GMS TO AFFECTED AREA 3 4 TIMES DAILY AS NEEDED FOR PAIN  THIS IS A COMPOUNDED CREAM 1 PUMP EQUALS 1 GRAM   NONFORMULARY OR COMPOUNDED ITEM    [facility]:  Peripheral Neuropathy cream - Bupivacaine 1%, Doxepin 3%, Gabapentin 6%, Pentoxifylline 3%, Topiramate 1%, apply 1-2 gramsto affected are 3-4 times daily.   NYSTATIN CREAM (MYCOSTATIN)    Apply 1 application topically 3 (three) times daily.   OMEGA-3 FATTY ACIDS (FISH OIL) 1200 MG CPDR    Take 1,200 mg by mouth daily. Take one tablet by mouth once daily    PANTOPRAZOLE (PROTONIX) 40 MG TABLET    TAKE 1 TABLET (40 MG TOTAL) BY MOUTH DAILY.   RESTASIS 0.05 %  OPHTHALMIC EMULSION    Place 1 drop into both eyes 2 (two) times daily.    RISPERIDONE (RISPERDAL) 0.5 MG TABLET    TAKE 1 TABLET BY MOUTH TWICE A DAY   SYMBICORT 80-4.5 MCG/ACT INHALER    Place 2 puffs into alternate nostrils 2 (two) times daily.   VITAMIN C (ASCORBIC ACID) 500 MG TABLET    Take 500  mg by mouth daily.  Modified Medications   No medications on file  Discontinued Medications   PANTOPRAZOLE (PROTONIX) 40 MG TABLET    TAKE 1 TABLET (40 MG TOTAL) BY MOUTH DAILY.     Physical Exam:  Vitals:   07/16/17 1526  BP: 120/74  Pulse: 63  Resp: 17  Temp: 98.5 F (36.9 C)  TempSrc: Oral  SpO2: 98%  Weight: 140 lb 12.8 oz (63.9 kg)  Height: 5\' 2"  (1.575 m)   Body mass index is 25.75 kg/m.  Physical Exam  Constitutional: She appears well-developed and well-nourished. No distress.  HENT:  Head: Normocephalic and atraumatic.  Right Ear: External ear normal.  Left Ear: External ear normal.  Nose: Nose normal.  Mouth/Throat: Oropharynx is clear and moist. No oropharyngeal exudate.  Eyes: Pupils are equal, round, and reactive to light. Conjunctivae and EOM are normal.  Neck: Normal range of motion. Neck supple. No JVD present. No thyromegaly present.  Cardiovascular: Normal rate, regular rhythm, normal heart sounds and intact distal pulses.   Pulmonary/Chest: Effort normal and breath sounds normal. No respiratory distress.  Abdominal: Soft. Bowel sounds are normal.  Lymphadenopathy:    She has no cervical adenopathy.  Neurological: She is alert.  Some short term memory loss  Skin: Skin is warm and dry. No rash noted. No erythema.  Psychiatric: She has a normal mood and affect.    Labs reviewed: Basic Metabolic Panel:  Recent Labs  09/19/16 1415 09/19/16 1425 03/24/17 1200  NA 140 142 143  K 3.6 3.6 3.7  CL 108 106 112*  CO2 23  --  23  GLUCOSE 171* 164* 97  BUN 11 11 18   CREATININE 1.00 0.90 0.81  CALCIUM 9.7  --  9.1   Liver Function Tests:  Recent  Labs  09/19/16 1415 03/24/17 1200  AST 30 18  ALT 23 16  ALKPHOS 78 75  BILITOT 1.6* 0.9  PROT 7.4 6.5  ALBUMIN 4.2 4.0   No results for input(s): LIPASE, AMYLASE in the last 8760 hours. No results for input(s): AMMONIA in the last 8760 hours. CBC:  Recent Labs  09/19/16 1415 09/19/16 1425 03/24/17 1200  WBC 5.2  --  5.6  NEUTROABS 3.3  --  3,136  HGB 12.7 13.3 11.8  HCT 36.7 39.0 36.1  MCV 90.8  --  95.8  PLT 198  --  179   Lipid Panel:  Recent Labs  03/24/17 1200  CHOL 159  HDL 109  LDLCALC 41  TRIG 46  CHOLHDL 1.5   TSH: No results for input(s): TSH in the last 8760 hours. A1C: Lab Results  Component Value Date   HGBA1C 5.6 04/22/2016     Assessment/Plan 1. Night sweats -most likely due to warm temperature in house with wearing winter pajamas and multiple covers. Discussed lower the temperature in house, wearing less to bed or lighter weight clothes to bed.   2. Impacted cerumen of left ear -removed via ear lavage and use of curette. pt tolerated well and cerumen removed.    3. Paranoid type delusional disorder ([place]) Per chart review these are worsening as progression of memory loss which could be contributing to her feeling a gland that is enlarged that no one else has seen or felt.  Has also been to the dentist for full exam which dentist could no find anything going on with teeth and could not find swollen gland. Education provided that if the gland reappears and continues, if she is having  other symptoms of fatigue, cough, congestion, ear pain, fever to notify office  Next appt: as scheduled  Trueman Worlds K. Harle Battiest  [facility] [phone] 8 am - 5 pm) [phone] (after hours)

## 2017-07-20 ENCOUNTER — Other Ambulatory Visit: Payer: Self-pay | Admitting: Internal Medicine

## 2017-07-30 ENCOUNTER — Telehealth: Payer: Self-pay | Admitting: *Deleted

## 2017-07-30 DIAGNOSIS — H547 Unspecified visual loss: Secondary | ICD-10-CM

## 2017-07-30 NOTE — Telephone Encounter (Signed)
Referral placed, but need to know where she's going tomorrow.   [name] [name], D.O. [facility] [address], [place] [address] Cell Phone (Mon-Fri 8am-5pm):  [phone] On Call:  [phone] & follow prompts after 5pm & weekends Office Phone:  [phone] Office Fax:  [phone]

## 2017-07-30 NOTE — Telephone Encounter (Signed)
Patient daughter, Katharine Look called and left message on Clinical Intake Line requesting a referral to an eye specialist due to poor vision. Daughter stated that they have an appointment for tomorrow but needs appointment ASAP. Stated that it seems like patient's [facility] Dr[facility] Has closed. Please Advise.   Tried calling but # just rings.

## 2017-07-31 ENCOUNTER — Ambulatory Visit (INDEPENDENT_AMBULATORY_CARE_PROVIDER_SITE_OTHER): Payer: Medicare Other | Admitting: Internal Medicine

## 2017-07-31 ENCOUNTER — Encounter: Payer: Self-pay | Admitting: Internal Medicine

## 2017-07-31 VITALS — BP 142/70 | HR 54 | Temp 98.2°F | Ht 62.0 in | Wt 142.0 lb

## 2017-07-31 DIAGNOSIS — I10 Essential (primary) hypertension: Secondary | ICD-10-CM | POA: Diagnosis not present

## 2017-07-31 DIAGNOSIS — I6340 Cerebral infarction due to embolism of unspecified cerebral artery: Secondary | ICD-10-CM

## 2017-07-31 DIAGNOSIS — K5904 Chronic idiopathic constipation: Secondary | ICD-10-CM | POA: Diagnosis not present

## 2017-07-31 DIAGNOSIS — K219 Gastro-esophageal reflux disease without esophagitis: Secondary | ICD-10-CM | POA: Diagnosis not present

## 2017-07-31 DIAGNOSIS — J4520 Mild intermittent asthma, uncomplicated: Secondary | ICD-10-CM | POA: Diagnosis not present

## 2017-07-31 DIAGNOSIS — I872 Venous insufficiency (chronic) (peripheral): Secondary | ICD-10-CM | POA: Diagnosis not present

## 2017-07-31 DIAGNOSIS — H401130 Primary open-angle glaucoma, bilateral, stage unspecified: Secondary | ICD-10-CM | POA: Diagnosis not present

## 2017-07-31 MED ORDER — ZOSTER VAC RECOMB ADJUVANTED 50 MCG/0.5ML IM SUSR: 0.5000 mL | Freq: Once | INTRAMUSCULAR | 1 refills | Status: AC

## 2017-07-31 NOTE — Progress Notes (Signed)
Location:  [facility] clinic Provider:  Maleaha Hughett [name], D.O., C.M.D.  Code Status: full code Goals of Care:  Advanced Directives 07/16/2017  Does Patient Have a Medical Advance Directive? No  Type of Advance Directive -  Does patient want to make changes to medical advance directive? -  Copy of [place] in Chart? -  Would patient like information on creating a medical advance directive? -   Chief Complaint  Patient presents with  . Medical Management of Chronic Issues    4 month follow-up, here with daughter Katharine Look     HPI: Patient is a 83 y.o. female seen today for medical management of chronic diseases.    Tells me she had swollen glands that had resolved after one month when she saw NP.  She said they were coming and going before that.  Gone by the time she was seen.  Had seen dentist and fortunately, it was not.  She had also coughed up just a little blood.  Had nightsweats and turned out heat was up too high and she was wearing too heavy of pajamas.  She had no other signs of sickness and only lasted a few nights.  No longer gets them.  Her daughter notices that when she visits her, it's a bit dusty in the house. Advised to get a humidifier to help with the dry heat.    Had been going to Dr. Venetia Maxon.  Has had some difficulty getting in to see him and her daughter requests a different referral so will place for [facility].  Says vision is dim and blurry.  Left eye is worse.  No headaches.  Sometimes gets headache with left eye.  When had cataract surgery, "bag" burst in 2001 and she had a lot of problems.    Still takes gaviscon for her stomach.  Eats prunes, but then gets diarrhea for a few hrs with just two prunes.  Discussed cutting down to one prune.  She had been up to 4 prunes.  Says it used to take 5 prunes.    Swelling of ankles is well-controlled with compression.  Says she's been ok even without them at home.  BP has stabilized.  Has not  really changed sodium intake.    Past Medical History:  Diagnosis Date  . Anxiety   . Asthma   . Benign essential hypertension   . Bradycardia, drug induced 06/11/2016  . Edema extremities   . GERD (gastroesophageal reflux disease)   . Glaucoma   . Osteoarthritis   . PVD (peripheral vascular disease) ([place])   . Seasonal allergies   . Stroke [facility])     Past Surgical History:  Procedure Laterality Date  . ABDOMINAL HYSTERECTOMY  1981  . TONSILLECTOMY  1953    Allergies  Allergen Reactions  . Aspirin Other (See Comments)    sweats  . Codeine Other (See Comments)  . Iodine Other (See Comments)    Pt is not aware of this allergy, does not recall much info.  . Prednisone Other (See Comments)    sweats  . Lyrica [Pregabalin]     Wobbly, "more than sleepy"  . Benzonatate Itching and Rash  . Sulfa Antibiotics Other (See Comments)    Doesn't remember    Outpatient Encounter Prescriptions as of 07/31/2017  Medication Sig  . acetaminophen (TYLENOL) 500 MG tablet Take 1 tablet (500 mg total) by mouth every 6 (six) hours as needed for moderate pain.  Marland Kitchen albuterol (PROVENTIL HFA;VENTOLIN  HFA) 108 (90 Base) MCG/ACT inhaler Inhale 2 puffs into the lungs every 6 (six) hours as needed for wheezing.  Marland Kitchen alum hydroxide-mag trisilicate (GAVISCON) 41-32 MG CHEW chewable tablet Chew 1 tablet by mouth 3 (three) times daily as needed for flatulence (bowel spasms).  Marland Kitchen amLODipine (NORVASC) 5 MG tablet TAKE ONE TABLET BY MOUTH ONCE DAILY  . atorvastatin (LIPITOR) 10 MG tablet TAKE 1 TABLET BY MOUTH EVERY DAY  . B Complex Vitamins (B COMPLEX PO) Take 1 tablet by mouth daily.  . bimatoprost (LUMIGAN) 0.01 % SOLN Place 1 drop into both eyes at bedtime.  . Brinzolamide-Brimonidine (SIMBRINZA) 1-0.2 % SUSP Apply 1 drop to eye 3 (three) times daily.  Marland Kitchen BYSTOLIC 5 MG tablet TAKE 1 TABLET EVERY DAY  . Calcium Carbonate-Vitamin D (CALTRATE 600+D) 600-400 MG-UNIT per tablet Take 2 tablets by mouth daily.  .  Cholecalciferol (VITAMIN D3) 2000 units TABS Take 2,000 Units by mouth daily.  . clopidogrel (PLAVIX) 75 MG tablet TAKE 1 TABLET (75 MG TOTAL) BY MOUTH DAILY.  Marland Kitchen Coenzyme Q10 (COQ10) 50 MG CAPS Take 50 mg by mouth daily.  Marland Kitchen docusate sodium (COLACE) 100 MG capsule Take 1 capsule (100 mg total) by mouth daily as needed for mild constipation (to soften stool).  . fluticasone (FLONASE) 50 MCG/ACT nasal spray Place 1 spray into the nose daily.  Marland Kitchen loteprednol (LOTEMAX) 0.5 % ophthalmic suspension Place 1 drop into both eyes daily as needed (for dry eyes).   . Multiple Vitamins-Minerals (SENIOR MULTIVITAMIN PLUS) TABS Take 1 tablet by mouth daily.   . nebivolol (BYSTOLIC) 5 MG tablet Please keep upcoming appointment for future refills, thank you!  Marland Kitchen NONFORMULARY OR COMPOUNDED ITEM APPLY 1 2 GMS TO AFFECTED AREA 3 4 TIMES DAILY AS NEEDED FOR PAIN  THIS IS A COMPOUNDED CREAM 1 PUMP EQUALS 1 GRAM  . NONFORMULARY OR COMPOUNDED ITEM [facility]:  Peripheral Neuropathy cream - Bupivacaine 1%, Doxepin 3%, Gabapentin 6%, Pentoxifylline 3%, Topiramate 1%, apply 1-2 gramsto affected are 3-4 times daily.  Marland Kitchen nystatin cream (MYCOSTATIN) Apply 1 application topically 3 (three) times daily.  . Omega-3 Fatty Acids (FISH OIL) 1200 MG CPDR Take 1,200 mg by mouth daily. Take one tablet by mouth once daily   . pantoprazole (PROTONIX) 40 MG tablet Take 1 tablet (40 mg total) by mouth daily.  . RESTASIS 0.05 % ophthalmic emulsion Place 1 drop into both eyes 2 (two) times daily.   . risperiDONE (RISPERDAL) 0.5 MG tablet TAKE 1 TABLET BY MOUTH TWICE A DAY  . SYMBICORT 80-4.5 MCG/ACT inhaler Place 2 puffs into alternate nostrils 2 (two) times daily.  . vitamin C (ASCORBIC ACID) 500 MG tablet Take 500 mg by mouth daily.  Marland Kitchen Zoster Vaccine Adjuvanted [facility]) injection Inject 0.5 mLs into the muscle once.  . [DISCONTINUED] Zoster Vaccine Adjuvanted [facility]) injection Inject 0.5 mLs into the muscle once.   No  facility-administered encounter medications on file as of 07/31/2017.     Review of Systems:  Review of Systems  Constitutional: Negative for chills, fever and malaise/fatigue.  HENT: Negative for congestion.   Eyes: Positive for blurred vision and redness. Negative for discharge.       Watery eyes  Respiratory: Positive for cough. Negative for shortness of breath and wheezing.   Cardiovascular: Negative for chest pain, palpitations and leg swelling.  Gastrointestinal: Positive for constipation, diarrhea and heartburn. Negative for abdominal pain, blood in stool, melena, nausea and vomiting.  Genitourinary: Negative for dysuria.  Musculoskeletal: Negative for  falls.  Skin: Negative for itching and rash.  Neurological: Positive for headaches. Negative for dizziness, loss of consciousness and weakness.  Endo/Heme/Allergies: Does not bruise/bleed easily.  Psychiatric/Behavioral: Positive for memory loss. Negative for depression.    Health Maintenance  Topic Date Due  . MAMMOGRAM  10/07/2018 (Originally 09/28/2009)  . TETANUS/TDAP  10/07/2018 (Originally 10/04/1943)  . INFLUENZA VACCINE  Completed  . DEXA SCAN  Completed  . PNA vac Low Risk Adult  Completed    Physical Exam: Vitals:   07/31/17 1255  BP: (!) 142/70  Pulse: (!) 54  Temp: 98.2 F (36.8 C)  TempSrc: Oral  SpO2: 98%  Weight: 142 lb (64.4 kg)  Height: 5\' 2"  (1.575 m)   Body mass index is 25.97 kg/m. Physical Exam  Constitutional: She is oriented to person, place, and time. She appears well-developed and well-nourished. No distress.  HENT:  Head: Normocephalic and atraumatic.  Eyes:  glasses  Cardiovascular: Normal rate, regular rhythm, normal heart sounds and intact distal pulses.   Pulmonary/Chest: Effort normal and breath sounds normal. No respiratory distress.  Musculoskeletal: Normal range of motion.  Walks with cane  Neurological: She is alert and oriented to person, place, and time.  Speaks very  slowly  Skin: Skin is warm and dry. Capillary refill takes less than 2 seconds.  Psychiatric: She has a normal mood and affect.    Labs reviewed: Basic Metabolic Panel:  Recent Labs  09/19/16 1415 09/19/16 1425 03/24/17 1200  NA 140 142 143  K 3.6 3.6 3.7  CL 108 106 112*  CO2 23  --  23  GLUCOSE 171* 164* 97  BUN 11 11 18   CREATININE 1.00 0.90 0.81  CALCIUM 9.7  --  9.1   Liver Function Tests:  Recent Labs  09/19/16 1415 03/24/17 1200  AST 30 18  ALT 23 16  ALKPHOS 78 75  BILITOT 1.6* 0.9  PROT 7.4 6.5  ALBUMIN 4.2 4.0   No results for input(s): LIPASE, AMYLASE in the last 8760 hours. No results for input(s): AMMONIA in the last 8760 hours. CBC:  Recent Labs  09/19/16 1415 09/19/16 1425 03/24/17 1200  WBC 5.2  --  5.6  NEUTROABS 3.3  --  3,136  HGB 12.7 13.3 11.8  HCT 36.7 39.0 36.1  MCV 90.8  --  95.8  PLT 198  --  179   Lipid Panel:  Recent Labs  03/24/17 1200  CHOL 159  HDL 109  LDLCALC 41  TRIG 46  CHOLHDL 1.5   Lab Results  Component Value Date   HGBA1C 5.6 04/22/2016    Assessment/Plan 1. Chronic idiopathic constipation -decrease to one prune per day, if this still causes loose stool, call back for alternative interventions  2. Gastroesophageal reflux disease without esophagitis -cont current therapy, ongoing even with gaviscon and protonix  3. Mild intermittent asthma without complication -controlled with albuterol, symbicort, daughter going to get pt's house less dusty and use humidifier  4. Benign essential hypertension -bp at goal and less fluctuant recently, cont same regimen and monitor  5. Venous insufficiency of both lower extremities -doing well with compression hose--cont same  6. Primary open angle glaucoma of both eyes, unspecified glaucoma stage -needs new ophthalmologist--recommended [facility]  Rx again given for shingrix to get at pharmacy (they previously forgot)  Labs/tests ordered:  No  orders of the defined types were placed in this encounter.  Next appt:  11/27/2017 med mgt  Ricarda Atayde L. Sheriden Archibeque, D.O. [place]  [address] [place], [place] [address] Cell Phone (Mon-Fri 8am-5pm):  [phone] On Call:  [phone] & follow prompts after 5pm & weekends Office Phone:  [phone] Office Fax:  [phone]

## 2017-07-31 NOTE — Patient Instructions (Signed)
Please discuss the DNR status and bring [place] copies of the living will and health care power of attorney.   Decrease to one prune per day.

## 2017-08-19 DIAGNOSIS — H401133 Primary open-angle glaucoma, bilateral, severe stage: Secondary | ICD-10-CM | POA: Diagnosis not present

## 2017-08-19 DIAGNOSIS — G451 Carotid artery syndrome (hemispheric): Secondary | ICD-10-CM | POA: Diagnosis not present

## 2017-08-20 ENCOUNTER — Telehealth: Payer: Self-pay | Admitting: Neurology

## 2017-08-20 NOTE — Telephone Encounter (Signed)
[facility] rep. Called stating she was referring pt back to Dr Erlinda Hong because pt has complained of seeing checker boards again and last time she did her neck was swollen. Rep from [facility] stated pt needed to be seen for Carot[place] artery.  Rep agreed to fax over notes for pt to Dr Erlinda Hong and RN Katrina

## 2017-08-20 NOTE — Telephone Encounter (Signed)
Eye report given to Dr.Xu. He has seen the report from eye doctor.

## 2017-08-20 NOTE — Telephone Encounter (Addendum)
I went over the ophthalmology report and pt went to see her ophthalmologist for worsening vision.  Patient has long history of bilateral glaucoma, seems getting worse over time.  According to the report, her intraocular pressure still high on the right side, consistent with glaucoma, and likely the vision difficulty still consistent with glaucoma.  However as per report, ophthalmologist found she had left carotid artery bruit on exam, recommend follow-up with [facility].  Patient condition does not sound like amaurosis fugax or hemianopia, we can workup on carotid bruit, however her last MRA head and neck did not show carotid stenosis.  We will see her on 09/03/17 in the morning.  Hi, Inez Catalina and Wells Guiles, can we move her appointment with me from 10/15/17 to 09/03/17 at 8:30am. Please unblock that slot. Thanks much.   Rosalin Hawking, MD PhD Stroke Neurology 08/20/2017 4:55 PM

## 2017-08-21 NOTE — Telephone Encounter (Signed)
Rn call patients daughter Katharine Look appt move till 09/03/2017 per Dr. Erlinda Hong. Appt made at 09/03/2017 check in at 54am. Rn made daughter aware Dr. Erlinda Hong has a meeting that morning,and may be a little late.Katharine Look verbalized understanding.

## 2017-09-03 ENCOUNTER — Ambulatory Visit (INDEPENDENT_AMBULATORY_CARE_PROVIDER_SITE_OTHER): Payer: Medicare Other | Admitting: Neurology

## 2017-09-03 ENCOUNTER — Encounter: Payer: Self-pay | Admitting: Neurology

## 2017-09-03 VITALS — BP 161/65 | HR 51 | Ht 62.0 in | Wt 144.2 lb

## 2017-09-03 DIAGNOSIS — H53122 Transient visual loss, left eye: Secondary | ICD-10-CM

## 2017-09-03 DIAGNOSIS — H409 Unspecified glaucoma: Secondary | ICD-10-CM | POA: Diagnosis not present

## 2017-09-03 DIAGNOSIS — I10 Essential (primary) hypertension: Secondary | ICD-10-CM | POA: Diagnosis not present

## 2017-09-03 DIAGNOSIS — E785 Hyperlipidemia, unspecified: Secondary | ICD-10-CM

## 2017-09-03 DIAGNOSIS — G5793 Unspecified mononeuropathy of bilateral lower limbs: Secondary | ICD-10-CM | POA: Diagnosis not present

## 2017-09-03 DIAGNOSIS — H401113 Primary open-angle glaucoma, right eye, severe stage: Secondary | ICD-10-CM | POA: Insufficient documentation

## 2017-09-03 DIAGNOSIS — I6340 Cerebral infarction due to embolism of unspecified cerebral artery: Secondary | ICD-10-CM | POA: Diagnosis not present

## 2017-09-03 NOTE — Patient Instructions (Signed)
-   continue plavix and lipitor for stroke prevention - check BP at home and record and bring over to [name] for medication adjustment if needed  - will do carotid doppler to rule out carotid artery narrowing - will check blood work to rule out eye vessel inflammation - follow up with eye doctor for glaucoma treatment - Follow up with your primary care physician for stroke risk factor modification. Recommend maintain blood pressure goal 130/80, diabetes with hemoglobin A1c goal below 6.5% and lipids with LDL cholesterol goal below 70 mg/dL.  - healthy diet and regular exercise  - follow up in 3 months.

## 2017-09-03 NOTE — Progress Notes (Signed)
STROKE NEUROLOGY FOLLOW UP NOTE  NAME: [name] [name] DOB: 1924/09/06  REASON FOR VISIT: stroke follow up HISTORY FROM: chart and pt  Today we had the pleasure of seeing [name] [name] in follow-up at our Neurology Clinic. Pt was accompanied by daughter.   History Summary [name] is a 83 y.o. female with history of HTN, PVD, asthma, anxiety admitted on 12/09/15 for vertigo. Symptoms resolved quickly. She did not receive IV t-PA due to resolution of deficits. However, MRI showed left cerebellar cortex and left temporoparietal punctate infarcts, embolic pattern. MRA head and neck unremarkable. TTE unremarkable, LDL 80 and A1C 5.7. She was put on plavix and low dose lipitor on discharge.   02/06/16 follow up - the patient has been doing well. No recurrent stroke like symptoms. However, her BP not in good control. Worked with PCP and currently on amlodipine 218m (she can not tolerate with 193OI and bystolic. Today BP 162/72. No recurrent vertigo. Walk with cane and steady.     05/14/16 follow up - the pt has been doing well. BP better controlled than last time but still not at goal. Today BP 153/67. Patient stated that her BP at home fluctuate, seems in the normal range 3-4 hours after medication. Dr. RMariea Clontsis following up with her BP management. Had 30 day cardiac event monitoring which did not show A. Fib. Other than that, patient no complaints.  11/13/16 follow up - pt has been doing well. No recurrent stroke like symptoms. BP still at high end at home, 150s. Today in clinic 151/82. On amlodipine 531mand bystolic 18m89mShe stated that she can not tolerate higher dose of amlodipine or bystolic. She had recent cold and still coughing, need to schedule to see Dr. ReeMariea Clonts Interval History During the interval time, pt had numbness and tingling in her hands and feet in glove distribution. EMG/NCS done showed primary axonal polyneuropathy which is consistent with diabetic neuropathy. She was using  topic creams with some effectiveness. Recently, she continues to follow up with ophthalmology for glaucoma treatment. Her right glaucoma is worse than left but she complained to her ophthalmology that she has intermittent left vision loss and found to have left carotid bruits. She was referred back here for evaluation.   On detailed history taking, she said she probably had this issue for more than 6 months. The episodes she felt like a big check board blocking her left eye acutely, no warning sign and no clear triggers. She use lotemax everytime on the left eye and symptom resolved in 2-3 min. Episodes occurs every 6-10 days. She has not tried not using lotemax to see how long it will take. She has not checked to close either eye to see if the visual problem is from one eye or both eyes. She admitted some mild HA at right above eyebrow but not everytime associated with visual loss. She has some left shoulder nagging pain, but not on right shoulder. No left temporal pain or jaw claudication. No fever. BP at home good, but today in clinic 161/65.   REVIEW OF SYSTEMS: Full 14 system review of systems performed and notable only for those listed below and in HPI above, all others are negative:  Constitutional:   Cardiovascular:  Ear/Nose/Throat:   Skin:  Eyes:  Light sensitivity, loss of vision, blurry vision, eye pain Respiratory:  SOB Gastroitestinal:  consitipation Genitourinary:  Hematology/Lymphatic:  Swollen lymph nodes Endocrine:  Musculoskeletal:   Allergy/Immunology:  Env allergy Neurological:   Psychiatric:  Sleep: daytime sleepiness  The following represents the patient's updated allergies and side effects list: Allergies  Allergen Reactions  . Aspirin Other (See Comments)    sweats  . Codeine Other (See Comments)  . Iodine Other (See Comments)    Pt is not aware of this allergy, does not recall much info.  . Prednisone Other (See Comments)    sweats  . Lyrica [Pregabalin]      Wobbly, "more than sleepy"  . Benzonatate Itching and Rash  . Sulfa Antibiotics Other (See Comments)    Doesn't remember    The neurologically relevant items on the patient's problem list were reviewed on today's visit.  Neurologic Examination  A problem focused neurological exam (12 or more points of the single system neurologic examination, vital signs counts as 1 point, cranial nerves count for 8 points) was performed.  Blood pressure (!) 161/65, pulse (!) 51, height 5' 2"  (1.575 m), weight 144 lb 3.2 oz (65.4 kg).  General - Well nourished, well developed, in no apparent distress.  Ophthalmologic - Fundi not visualized due to light sensitivity.  Cardiovascular - Regular rate and rhythm.  Neck - no significant bruits bilaterally  Mental Status -  Level of arousal and orientation to time, place, and person were intact. Language including expression, naming, repetition, comprehension was assessed and found intact. Fund of Knowledge was assessed and was intact.  Cranial Nerves II - XII - II - Visual field intact OU. III, IV, VI - Extraocular movements intact. V - Facial sensation intact bilaterally. VII - Facial movement intact bilaterally. VIII - Hearing & vestibular intact bilaterally. X - Palate elevates symmetrically. XI - Chin turning & shoulder shrug intact bilaterally. XII - Tongue protrusion intact.  Motor Strength - The patient's strength was normal in all extremities and pronator drift was absent.  Bulk was normal and fasciculations were absent.   Motor Tone - Muscle tone was assessed at the neck and appendages and was normal  Reflexes - The patient's reflexes were 1+ in all extremities and she had no pathological reflexes.  Sensory - Light touch, temperature/pinprick were assessed and were normal.    Coordination - The patient had normal movements in the hands and feet with no ataxia or dysmetria.  Tremor was absent.  Gait and Station - walk with cane, slow  and mildly shuffling gait, steady no fall.    Data reviewed: I personally reviewed the images and agree with the radiology interpretations.   Ct Head Wo Contrast 12/09/2015  Atrophy with small vessel chronic ischemic changes of deep cerebral white matter. No acute intracranial abnormalities.  MRA HEAD  12/10/2015  Negative intracranial MRA of the brain. No large or proximal arterial branch occlusion. No high-grade or correctable stenosis.   MRA NECK  12/10/2015  1. No flow limiting stenosis or other acute abnormality within the major arterial vasculature of the neck.  2. Tortuosity of the arterial vasculature of the neck, suggesting chronic underlying hypertension.   Mr Brain Wo Contrast 12/09/2015  Punctate acute infarction in the peripheral left cerebellum and in the peripheral left posterior temporal region, consistent with micro embolic infarctions in the posterior circulation. No large infarction, swelling or hemorrhage. Chronic small vessel ischemic changes elsewhere throughout the brain, similar to the study of 2015.   2D Echocardiogram  - Left ventricle: The cavity size was normal. Wall thickness wasnormal. Systolic function was normal. The estimated ejectionfraction was in the range of 60% to  65%. Wall motion was normal;there were no regional wall motion abnormalities. - Pulmonic valve: There was moderate regurgitation. - Pulmonary arteries: Systolic pressure was mildly increased. PApeak pressure: 32 mm Hg (S). Impressions: No cardiac source of emboli was indentified. Compared to theprior study, there has been no significant interval change.  30 day cardiac event monitoring - no A. Fib  Ct Head Wo Contrast 09/19/2016 IMPRESSION: No evidence of acute intracranial abnormality. Atrophy and chronic small-vessel white matter ischemic changes.    Component     Latest Ref Rng 07/17/2015 12/10/2015  Cholesterol     0 - 200 mg/dL  173  Triglycerides     <150 mg/dL   34  HDL Cholesterol     >40 mg/dL  86  Total CHOL/HDL Ratio       2.0  VLDL     0 - 40 mg/dL  7  LDL (calc)     0 - 99 mg/dL  80  Hemoglobin A1C     4.8 - 5.6 % 5.5 5.7 (H)  Est. average glucose Bld gHb Est-mCnc      111   Mean Plasma Glucose       117    Assessment: As you may recall, she is a 83 y.o. African American female with PMH of HTN, PVD, asthma, anxiety admitted on 12/09/15 for vertigo. Symptoms resolved quickly. MRI showed left cerebellar cortex and left temporoparietal punctate infarcts, embolic pattern. MRA head and neck unremarkable. TTE unremarkable, LDL 80 and A1C 5.7. She was put on plavix and low dose lipitor on discharge. During the interval time, the patient has been doing well. No recurrent stroke like symptoms. Had a 30 day cardiac event monitoring which did not show A. Fib. Had hands and feet numbness, EMG/NCS confirmed polyneuropathy consistent with diabetes.   Started to have episodic left eye visual loss for the last 6 months. Every 6-10 days, use lotemax to the eye and symptoms resolve in 2-3 min. No signifcant neck bruits. Her MRA neck in 2017 was unremarkable. Will do CUS to rule out carotid stenosis. Will also check ESR and CRP to rule out TA but symptoms not quite consistent with that. If all negative, may consider ocular migraine.   Plan:  - continue plavix and lipitor for stroke prevention - check BP at home and record and bring over to [name] for medication adjustment if needed  - will do carotid doppler to rule out carotid artery narrowing - will check blood work to rule out eye vessel inflammation - follow up with eye doctor for glaucoma treatment - Follow up with your primary care physician for stroke risk factor modification. Recommend maintain blood pressure goal 130/80, diabetes with hemoglobin A1c goal below 6.5% and lipids with LDL cholesterol goal below 70 mg/dL.  - healthy diet and regular exercise  - follow up in 3 months.    Orders Placed  This Encounter  Procedures  . CMP  . Sedimentation rate  . C-reactive protein    No orders of the defined types were placed in this encounter.   Patient Instructions  - continue plavix and lipitor for stroke prevention - check BP at home and record and bring over to [name] for medication adjustment if needed  - will do carotid doppler to rule out carotid artery narrowing - will check blood work to rule out eye vessel inflammation - follow up with eye doctor for glaucoma treatment - Follow up with your primary care physician for stroke  risk factor modification. Recommend maintain blood pressure goal 130/80, diabetes with hemoglobin A1c goal below 6.5% and lipids with LDL cholesterol goal below 70 mg/dL.  - healthy diet and regular exercise  - follow up in 3 months.    Rosalin Hawking, MD PhD [facility] [address] [place], [place] [address] [phone]

## 2017-09-04 ENCOUNTER — Telehealth: Payer: Self-pay

## 2017-09-04 LAB — COMPREHENSIVE METABOLIC PANEL
ALBUMIN: 4.2 g/dL (ref 3.2–4.6)
ALK PHOS: 83 IU/L (ref 39–117)
ALT: 17 IU/L (ref 0–32)
AST: 21 IU/L (ref 0–40)
Albumin/Globulin Ratio: 1.6 (ref 1.2–2.2)
BUN / CREAT RATIO: 15 (ref 12–28)
BUN: 14 mg/dL (ref 10–36)
Bilirubin Total: 0.8 mg/dL (ref 0.0–1.2)
CO2: 24 mmol/L (ref 20–29)
CREATININE: 0.91 mg/dL (ref 0.57–1.00)
Calcium: 9.4 mg/dL (ref 8.7–10.3)
Chloride: 108 mmol/L — ABNORMAL HIGH (ref 96–106)
GFR, EST AFRICAN AMERICAN: 64 mL/min/{1.73_m2} (ref >59)
GFR, EST NON AFRICAN AMERICAN: 56 mL/min/{1.73_m2} — AB (ref >59)
GLOBULIN, TOTAL: 2.6 g/dL (ref 1.5–4.5)
Glucose: 113 mg/dL — ABNORMAL HIGH (ref 65–99)
Potassium: 3.9 mmol/L (ref 3.5–5.2)
SODIUM: 145 mmol/L — AB (ref 134–144)
Total Protein: 6.8 g/dL (ref 6.0–8.5)

## 2017-09-04 LAB — C-REACTIVE PROTEIN: CRP: 2.6 mg/L (ref 0.0–4.9)

## 2017-09-04 LAB — SEDIMENTATION RATE: SED RATE: 6 mm/h (ref 0–40)

## 2017-09-04 NOTE — Telephone Encounter (Signed)
-----   Message from Rosalin Hawking, MD sent at 09/04/2017 10:26 AM EST ----- Could you please let the patient know that the blood test done yesterday in our office was normal. Please continue current treatment. Thanks.  Rosalin Hawking, MD PhD Stroke Neurology 09/04/2017 10:25 AM

## 2017-09-04 NOTE — Telephone Encounter (Signed)
Rn call patients daughter [name] [name] that her moms blood test was normal. Continue treatment plan. Pts daughter verbalized understanding. ------

## 2017-09-11 ENCOUNTER — Other Ambulatory Visit: Payer: Self-pay | Admitting: Internal Medicine

## 2017-09-11 ENCOUNTER — Ambulatory Visit (HOSPITAL_COMMUNITY): Payer: Medicare Other

## 2017-09-15 ENCOUNTER — Ambulatory Visit: Payer: Medicare Other | Admitting: Podiatry

## 2017-09-16 ENCOUNTER — Ambulatory Visit (HOSPITAL_COMMUNITY): Payer: Medicare Other

## 2017-09-16 ENCOUNTER — Other Ambulatory Visit: Payer: Self-pay | Admitting: Cardiology

## 2017-09-23 ENCOUNTER — Ambulatory Visit (HOSPITAL_COMMUNITY)
Admission: RE | Admit: 2017-09-23 | Discharge: 2017-09-23 | Disposition: A | Discharge status: Home / Self Care | Payer: Medicare Other | Source: Ambulatory Visit | Attending: Neurology | Admitting: Neurology

## 2017-09-23 DIAGNOSIS — I6523 Occlusion and stenosis of bilateral carotid arteries: Secondary | ICD-10-CM | POA: Insufficient documentation

## 2017-09-23 DIAGNOSIS — H53122 Transient visual loss, left eye: Secondary | ICD-10-CM | POA: Insufficient documentation

## 2017-09-23 NOTE — Progress Notes (Signed)
Carotid duplex prelim: 1-39% ICA stenosis.  Fallyn Munnerlyn Eunice, RDMS, RVT   

## 2017-09-25 ENCOUNTER — Telehealth: Payer: Self-pay

## 2017-09-25 NOTE — Telephone Encounter (Signed)
Pt daughter (on [place]) has called back and is asking for a call from [place] when she is available

## 2017-09-25 NOTE — Telephone Encounter (Signed)
Notes recorded by Marval Regal, RN on 09/25/2017 at 12:15 PM EST Rn call patients daughter that the carotid ultrasound who is on dpr..patient Rn stated that the carotid ultrasound test done recently was unremarkable. No large vessel narrowing at this time. Please continue current treatment. Thanks. Pts daughter verbalized understanding.   The daughter wanted to know whats the plan if her mom has vision issues again.RN stated Dr Erlinda Hong did a work and neurological everything is okay. Rn recommend they contact the eye doctor, and ask them about referral to some speciality eye doctors. RN recommend daughter have her mom sign release forms to get copies of the last office visit from Dr.Xu, and carotid ultrasound results Dr. Erlinda Hong order.

## 2017-09-25 NOTE — Telephone Encounter (Signed)
Notes recorded by Marval Regal, RN on 09/25/2017 at 10:11 AM EST Vm was full could not leave message. ------  Notes recorded by Marval Regal, RN on 09/25/2017 at 10:11 AM EST Unable to leave vm on daughters phone to give results of vas carotid test.

## 2017-09-25 NOTE — Telephone Encounter (Signed)
-----   Message from Rosalin Hawking, MD sent at 09/23/2017  6:30 PM EST ----- Could you please let the patient know that the carotid ultrasound test done recently was unremarkable. No large vessel narrowing at this time. Please continue current treatment. Thanks.  Rosalin Hawking, MD PhD Stroke Neurology 09/23/2017 6:29 PM

## 2017-10-15 ENCOUNTER — Ambulatory Visit: Payer: Self-pay | Admitting: Neurology

## 2017-10-15 ENCOUNTER — Ambulatory Visit: Payer: Medicare Other | Admitting: Neurology

## 2017-10-25 ENCOUNTER — Other Ambulatory Visit: Payer: Self-pay | Admitting: Cardiology

## 2017-10-28 ENCOUNTER — Ambulatory Visit (INDEPENDENT_AMBULATORY_CARE_PROVIDER_SITE_OTHER): Payer: Medicare Other | Admitting: Podiatry

## 2017-10-28 ENCOUNTER — Ambulatory Visit (INDEPENDENT_AMBULATORY_CARE_PROVIDER_SITE_OTHER): Payer: Medicare Other | Admitting: Internal Medicine

## 2017-10-28 ENCOUNTER — Encounter: Payer: Self-pay | Admitting: Internal Medicine

## 2017-10-28 ENCOUNTER — Other Ambulatory Visit: Payer: Self-pay | Admitting: Cardiology

## 2017-10-28 VITALS — BP 130/62 | HR 52 | Temp 98.0°F | Resp 18 | Ht 62.0 in | Wt 144.6 lb

## 2017-10-28 DIAGNOSIS — G5793 Unspecified mononeuropathy of bilateral lower limbs: Secondary | ICD-10-CM

## 2017-10-28 DIAGNOSIS — R0989 Other specified symptoms and signs involving the circulatory and respiratory systems: Secondary | ICD-10-CM

## 2017-10-28 DIAGNOSIS — B351 Tinea unguium: Secondary | ICD-10-CM

## 2017-10-28 DIAGNOSIS — I872 Venous insufficiency (chronic) (peripheral): Secondary | ICD-10-CM

## 2017-10-28 DIAGNOSIS — R252 Cramp and spasm: Secondary | ICD-10-CM | POA: Diagnosis not present

## 2017-10-28 DIAGNOSIS — M79676 Pain in unspecified toe(s): Secondary | ICD-10-CM

## 2017-10-28 DIAGNOSIS — Q828 Other specified congenital malformations of skin: Secondary | ICD-10-CM

## 2017-10-28 NOTE — Patient Instructions (Signed)
Will call with [place] appt  Continue current medications as ordered  Follow up with podiatry as scheduled  Will call with lab results  Follow up as scheduled with Dr Mariea Clonts or sooner if need be

## 2017-10-28 NOTE — Progress Notes (Signed)
[name], female   DOB: 07-21-1924, 84 y.o.   MRN: [id number]   [facility] OFFICE  Provider: DR Arletha Grippe  Goals of Care:  Advanced Directives 07/31/2017  Does Patient Have a Medical Advance Directive? Yes  Type of Advance Directive [place]  Does patient want to make changes to medical advance directive? -  Copy of [place] in Chart? No - copy requested  Would patient like information on creating a medical advance directive? -     Chief Complaint  Patient presents with  . Acute Visit    Pain in both legs, left is worse than right     HPI: Patient is a 84 y.o. female seen today for an acute visit for b/l LE pain. She has a hx OA, edema and PAD. She uses compd pain crms on feet for neuropathy and arthritis. She is a poor historian due to psych d/o. Hx obtained from daughter and chart. She reports back of legs cramping with no relief from mustard x 1 month. She has an appt to see Dr Jacqualyn Posey this PM. No numbness/tingling.  Past Medical History:  Diagnosis Date  . Anxiety   . Asthma   . Benign essential hypertension   . Bradycardia, drug induced 06/11/2016  . Edema extremities   . GERD (gastroesophageal reflux disease)   . Glaucoma   . Osteoarthritis   . PVD (peripheral vascular disease) ([place])   . Seasonal allergies   . Stroke [facility])     Past Surgical History:  Procedure Laterality Date  . ABDOMINAL HYSTERECTOMY  1981  . TONSILLECTOMY  1953     reports that  has never smoked. she has never used smokeless tobacco. She reports that she does not drink alcohol or use drugs. Social History   Socioeconomic History  . Marital status: Widowed    Spouse name: Not on file  . Number of children: Not on file  . Years of education: Not on file  . Highest education level: Not on file  Social Needs  . Financial resource strain: Not on file  . Food insecurity - worry: Not on file  . Food insecurity - inability: Not on file  .  Transportation needs - medical: Not on file  . Transportation needs - non-medical: Not on file  Occupational History  . Not on file  Tobacco Use  . Smoking status: Never Smoker  . Smokeless tobacco: Never Used  Substance and Sexual Activity  . Alcohol use: No    Alcohol/week: 0.0 oz  . Drug use: No  . Sexual activity: Not Currently  Other Topics Concern  . 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 Stroke Father   . Hypertension Father   . Heart disease Mother   . Hypertension Mother   . Diabetes Mother     Allergies  Allergen Reactions  . Aspirin Other (See Comments)    sweats  . Codeine Other (See Comments)  . Iodine Other (See Comments)    Pt is not aware of this allergy, does not recall much info.  . Prednisone Other (See Comments)    sweats  . Lyrica [Pregabalin]     Wobbly, "more than sleepy"  . Benzonatate Itching and Rash  . Sulfa Antibiotics Other (See Comments)  Doesn't remember    Outpatient Encounter Medications as of 10/28/2017  Medication Sig  . acetaminophen (TYLENOL) 500 MG tablet Take 1 tablet (500 mg total) by mouth every 6 (six) hours as needed for moderate pain.  Marland Kitchen albuterol (PROVENTIL HFA;VENTOLIN HFA) 108 (90 Base) MCG/ACT inhaler Inhale 2 puffs into the lungs every 6 (six) hours as needed for wheezing.  Marland Kitchen alum hydroxide-mag trisilicate (GAVISCON) 70-35 MG CHEW chewable tablet Chew 1 tablet by mouth 3 (three) times daily as needed for flatulence (bowel spasms).  Marland Kitchen amLODipine (NORVASC) 5 MG tablet TAKE ONE TABLET BY MOUTH ONCE DAILY  . atorvastatin (LIPITOR) 10 MG tablet TAKE 1 TABLET BY MOUTH EVERY DAY  . B Complex Vitamins (B COMPLEX PO) Take 1 tablet by mouth daily.  . bimatoprost (LUMIGAN) 0.01 % SOLN Place 1 drop into both eyes at bedtime.  . Brinzolamide-Brimonidine (SIMBRINZA) 1-0.2 % SUSP Apply 1 drop  to eye 3 (three) times daily.  Marland Kitchen BYSTOLIC 5 MG tablet TAKE 1 TABLET EVERY DAY  . Calcium Carbonate-Vitamin D (CALTRATE 600+D) 600-400 MG-UNIT per tablet Take 2 tablets by mouth daily.  . Cholecalciferol (VITAMIN D3) 2000 units TABS Take 2,000 Units by mouth daily.  . clopidogrel (PLAVIX) 75 MG tablet TAKE 1 TABLET (75 MG TOTAL) BY MOUTH DAILY.  Marland Kitchen Coenzyme Q10 (COQ10) 50 MG CAPS Take 50 mg by mouth daily.  Marland Kitchen docusate sodium (COLACE) 100 MG capsule Take 1 capsule (100 mg total) by mouth daily as needed for mild constipation (to soften stool).  . fluticasone (FLONASE) 50 MCG/ACT nasal spray Place 1 spray into the nose daily.  Marland Kitchen loteprednol (LOTEMAX) 0.5 % ophthalmic suspension Place 1 drop into both eyes daily as needed (for dry eyes).   . Multiple Vitamins-Minerals (SENIOR MULTIVITAMIN PLUS) TABS Take 1 tablet by mouth daily.   . nebivolol (BYSTOLIC) 5 MG tablet TAKE AS DIRECTED PLEASE KEEP UPCOMING APPOINTMENT FOR FUTURE REFILLS, THANK YOU!  . NONFORMULARY OR COMPOUNDED ITEM APPLY 1 2 GMS TO AFFECTED AREA 3 4 TIMES DAILY AS NEEDED FOR PAIN  THIS IS A COMPOUNDED CREAM 1 PUMP EQUALS 1 GRAM  . NONFORMULARY OR COMPOUNDED ITEM [facility]:  Peripheral Neuropathy cream - Bupivacaine 1%, Doxepin 3%, Gabapentin 6%, Pentoxifylline 3%, Topiramate 1%, apply 1-2 gramsto affected are 3-4 times daily.  . NONFORMULARY OR COMPOUNDED ITEM APPLY 1 2 GMS TO AFFECTED AREA 3 4 TIMES DAILY AS NEEDED FOR PAIN  THIS IS A COMPOUNDED CREAM 1 PUMP EQUALS 1 GRAM  . nystatin cream (MYCOSTATIN) Apply 1 application topically 3 (three) times daily.  . Omega-3 Fatty Acids (FISH OIL) 1200 MG CPDR Take 1,200 mg by mouth daily. Take one tablet by mouth once daily   . pantoprazole (PROTONIX) 40 MG tablet Take 1 tablet (40 mg total) by mouth daily.  . RESTASIS 0.05 % ophthalmic emulsion Place 1 drop into both eyes 2 (two) times daily.   . risperiDONE (RISPERDAL) 0.5 MG tablet TAKE 1 TABLET BY MOUTH TWICE A DAY  . SYMBICORT 80-4.5  MCG/ACT inhaler Place 2 puffs into alternate nostrils 2 (two) times daily.  . vitamin C (ASCORBIC ACID) 500 MG tablet Take 500 mg by mouth daily.   No facility-administered encounter medications on file as of 10/28/2017.     Review of Systems:  Review of Systems  Musculoskeletal: Positive for arthralgias and gait problem.  Neurological: Positive for numbness.  All other systems reviewed and are negative.   Health Maintenance  Topic Date Due  . MAMMOGRAM  10/07/2018 (Originally  09/28/2009)  . TETANUS/TDAP  10/07/2018 (Originally [birth year]04/1943)  . INFLUENZA VACCINE  Completed  . DEXA SCAN  Completed  . PNA vac Low Risk Adult  Completed    Physical Exam: Vitals:   10/28/17 1101  BP: 130/62  Pulse: (!) 52  Resp: 18  Temp: 98 F (36.7 C)  TempSrc: Oral  SpO2: 97%  Weight: 144 lb 9.6 oz (65.6 kg)  Height: 5' 2" (1.575 m)   Body mass index is 26.45 kg/m. Physical Exam  Constitutional: She is oriented to person, place, and time. She appears well-developed and well-nourished.  HENT:  Mouth/Throat: Oropharynx is clear and moist. No oropharyngeal exudate.  MMM; no oral thrush  Eyes: Pupils are equal, round, and reactive to light. No scleral icterus.  Neck: Neck supple. Carotid bruit is not present. No tracheal deviation present. No thyromegaly present.  Cardiovascular: Regular rhythm and intact distal pulses. Bradycardia present. Exam reveals no gallop and no friction rub.  Murmur (1/6 SEM) heard. Pulses:      Dorsalis pedis pulses are 1+ on the right side, and 1+ on the left side.       Posterior tibial pulses are 1+ on the right side, and 1+ on the left side.  No LE edema b/l. no calf TTP. No palpable cords  Pulmonary/Chest: Effort normal and breath sounds normal. No stridor. No respiratory distress. She has no wheezes. She has no rales.  Abdominal: Soft. Normal appearance and bowel sounds are normal. She exhibits no distension and no mass. There is no hepatomegaly. There is no  tenderness. There is no rigidity, no rebound and no guarding. No hernia.  Musculoskeletal: She exhibits edema.  R>L knee swelling with left clicking on ROM  Lymphadenopathy:    She has no cervical adenopathy.  Neurological: She is alert and oriented to person, place, and time.  Gait unsteady  Skin: Skin is warm and dry. No rash noted.  Psychiatric: She has a normal mood and affect. Her behavior is normal. Judgment and thought content normal.    Labs reviewed: Basic Metabolic Panel: Recent Labs    03/24/17 1200 09/03/17 1117  NA 143 145*  K 3.7 3.9  CL 112* 108*  CO2 23 24  GLUCOSE 97 113*  BUN 18 14  CREATININE 0.81 0.91  CALCIUM 9.1 9.4   Liver Function Tests: Recent Labs    03/24/17 1200 09/03/17 1117  AST 18 21  ALT 16 17  ALKPHOS 75 83  BILITOT 0.9 0.8  PROT 6.5 6.8  ALBUMIN 4.0 4.2   No results for input(s): LIPASE, AMYLASE in the last 8760 hours. No results for input(s): AMMONIA in the last 8760 hours. CBC: Recent Labs    03/24/17 1200  WBC 5.6  NEUTROABS 3,136  HGB 11.8  HCT 36.1  MCV 95.8  PLT 179   Lipid Panel: Recent Labs    03/24/17 1200  CHOL 159  HDL 109  LDLCALC 41  TRIG 46  CHOLHDL 1.5   Lab Results  Component Value Date   HGBA1C 5.6 04/22/2016    Procedures since last visit: No results found.  Assessment/Plan    ICD-10-CM   1. Weak pulse R09.89 VAS [place] ABI WITH/WO TBI  2. Neuropathy of both feet G57.93 Vitamin B12  3. Leg cramps R25.2 Vitamin B12    Magnesium    BMP with eGFR([facility])    VAS [place] ABI WITH/WO TBI  4. Venous insufficiency of both lower extremities I87.2 Vitamin B12    VAS [place] ABI  WITH/WO TBI   Will call with [place] appt  Continue current medications as ordered  Follow up with podiatry as scheduled  Will call with lab results  Follow up as scheduled with Dr Mariea Clonts or sooner if need be  Cordella Register. Perlie Gold  [facility] [address] [place], [place]  [address] [phone] Cell (Monday-Friday 8 AM - 5 PM) [phone] After 5 PM and follow prompts

## 2017-10-29 LAB — BASIC METABOLIC PANEL WITH GFR
BUN: 16 mg/dL (ref 7–25)
CALCIUM: 9.6 mg/dL (ref 8.6–10.4)
CHLORIDE: 109 mmol/L (ref 98–110)
CO2: 25 mmol/L (ref 20–32)
Creat: 0.85 mg/dL (ref 0.60–0.88)
GFR, Est African American: 69 mL/min/{1.73_m2} (ref >=60)
GFR, Est Non African American: 60 mL/min/{1.73_m2} (ref >=60)
Glucose, Bld: 97 mg/dL (ref 65–139)
POTASSIUM: 3.9 mmol/L (ref 3.5–5.3)
Sodium: 141 mmol/L (ref 135–146)

## 2017-10-29 LAB — VITAMIN B12: Vitamin B-12: 1324 pg/mL — ABNORMAL HIGH (ref 200–1100)

## 2017-10-29 LAB — MAGNESIUM: MAGNESIUM: 2.1 mg/dL (ref 1.5–2.5)

## 2017-10-29 NOTE — Progress Notes (Signed)
Subjective: 84 y.o. returns the office today for painful, elongated, thickened toenails which she cannot trim herself. Denies any redness or drainage around the nails. She also has painful calluses.  Decreased regular for the feet is been helping quite a bit.  She has been getting some leg pain.  She is scheduled for a venous duplex on Thursday.  Denies any recent injury or trauma.  Denies any acute changes since last appointment and no new complaints today. Denies any systemic complaints such as fevers, chills, nausea, vomiting.   Objective: AAO 3, NAD DP/PT pulses palpable, CRT less than 3 seconds Nails hypertrophic, dystrophic, elongated, brittle, discolored 10. There is tenderness overlying the nails 1-5 bilaterally. There is no surrounding erythema or drainage along the nail sites. Hyperkeratotic lesions right submetatarsal 1 and left lateral fourth toe. After debridement there is no underlying ulceration, drainage or any signs of infection No open lesions or pre-ulcerative lesions are identified. No other areas of tenderness bilateral lower extremities. No overlying edema, erythema, increased warmth. No pain with calf compression, swelling, warmth, erythema.  No pain in the calf today.  Assessment: Patient presents with symptomatic onychomycosis; porokeratosis; neuropathy   Plan: -Treatment options including alternatives, risks, complications were discussed -Nails sharply debrided 10 without complication/bleeding. -Hyperkeratotic lesions are sharply debrided 2 without any complications or bleeding -She can try to use the compound cream to her legs.  She is scheduled for venous duplex on Thursday.  She has no pain today so I doubt DVT -Discussed daily foot inspection. If there are any changes, to call the office immediately.  -Follow-up in 3 months or sooner if any problems are to arise. In the meantime, encouraged to call the office with any questions, concerns, changes  symptoms.  Celesta Gentile, DPM

## 2017-10-30 ENCOUNTER — Ambulatory Visit (HOSPITAL_COMMUNITY)
Admission: RE | Admit: 2017-10-30 | Discharge: 2017-10-30 | Disposition: A | Discharge status: Home / Self Care | Payer: Medicare Other | Source: Ambulatory Visit | Attending: Vascular Surgery | Admitting: Vascular Surgery

## 2017-10-30 DIAGNOSIS — H353231 Exudative age-related macular degeneration, bilateral, with active choroidal neovascularization: Secondary | ICD-10-CM | POA: Diagnosis not present

## 2017-10-30 DIAGNOSIS — R252 Cramp and spasm: Secondary | ICD-10-CM | POA: Diagnosis not present

## 2017-10-30 DIAGNOSIS — R0989 Other specified symptoms and signs involving the circulatory and respiratory systems: Secondary | ICD-10-CM | POA: Diagnosis not present

## 2017-10-30 DIAGNOSIS — I70201 Unspecified atherosclerosis of native arteries of extremities, right leg: Secondary | ICD-10-CM | POA: Insufficient documentation

## 2017-10-30 DIAGNOSIS — H5212 Myopia, left eye: Secondary | ICD-10-CM | POA: Diagnosis not present

## 2017-10-30 DIAGNOSIS — H5201 Hypermetropia, right eye: Secondary | ICD-10-CM | POA: Diagnosis not present

## 2017-10-30 DIAGNOSIS — H401133 Primary open-angle glaucoma, bilateral, severe stage: Secondary | ICD-10-CM | POA: Diagnosis not present

## 2017-10-30 DIAGNOSIS — I872 Venous insufficiency (chronic) (peripheral): Secondary | ICD-10-CM

## 2017-11-21 DIAGNOSIS — J300 Vasomotor rhinitis: Secondary | ICD-10-CM | POA: Diagnosis not present

## 2017-11-21 DIAGNOSIS — J4540 Moderate persistent asthma, uncomplicated: Secondary | ICD-10-CM | POA: Diagnosis not present

## 2017-11-21 DIAGNOSIS — J3089 Other allergic rhinitis: Secondary | ICD-10-CM | POA: Diagnosis not present

## 2017-11-27 ENCOUNTER — Encounter: Payer: Self-pay | Admitting: Internal Medicine

## 2017-11-27 ENCOUNTER — Ambulatory Visit (INDEPENDENT_AMBULATORY_CARE_PROVIDER_SITE_OTHER): Payer: Medicare Other | Admitting: Internal Medicine

## 2017-11-27 VITALS — BP 140/78 | HR 52 | Temp 98.2°F | Wt 141.0 lb

## 2017-11-27 DIAGNOSIS — I872 Venous insufficiency (chronic) (peripheral): Secondary | ICD-10-CM

## 2017-11-27 DIAGNOSIS — I739 Peripheral vascular disease, unspecified: Secondary | ICD-10-CM | POA: Diagnosis not present

## 2017-11-27 DIAGNOSIS — J4520 Mild intermittent asthma, uncomplicated: Secondary | ICD-10-CM

## 2017-11-27 DIAGNOSIS — H3530 Unspecified macular degeneration: Secondary | ICD-10-CM | POA: Diagnosis not present

## 2017-11-27 DIAGNOSIS — I10 Essential (primary) hypertension: Secondary | ICD-10-CM

## 2017-11-27 MED ORDER — ALBUTEROL SULFATE HFA 108 (90 BASE) MCG/ACT IN AERS: 2.0000 | INHALATION_SPRAY | Freq: Four times a day (QID) | RESPIRATORY_TRACT | 5 refills | Status: DC | PRN

## 2017-11-27 NOTE — Progress Notes (Signed)
Location:  [facility] clinic Provider:  Huong Luthi [name], D.O., C.M.D.  Code Status: full code--has been given living will and hcpoa forms more than once and these have not yet been done.    Goals of Care:  Advanced Directives 11/27/2017  Does Patient Have a Medical Advance Directive? No  Type of Advance Directive -  Does patient want to make changes to medical advance directive? -  Copy of [place] in Chart? -  Would patient like information on creating a medical advance directive? No - Patient declined   Chief Complaint  Patient presents with  . Medical Management of Chronic Issues    26mth follow-up  . ACP    HPI: Patient is a 84 y.o. female seen today for medical management of chronic diseases.    One new problem--has a mole or something growing under her navel.  It just popped up, has not changed.  It does not itch or hurt.  It's at the top of her underwear.    Has leg pains with walking in right greater than left.  Had ABIs done with Dr. Eulas Post and has mild arterial disease on the right.  Is taking her plavix and trying to ride her exercise bike and still do some walking.    She has macular degeneration not glaucoma now per her daughter.   She goes again tomorrow to ophthalmology, Dr. Valetta Close, and she's been started on new drops.  They seem to be helping.  She is going to also see a retina specialist.    No recent episodes of paranoid delusions reported.  Asthma well-controlled with symbicort and albuterol--not needing albuterol.  No wheezing or dyspnea.  BP today at upper limits of normal.  No dizziness or headache reported today. Past Medical History:  Diagnosis Date  . Anxiety   . Asthma   . Benign essential hypertension   . Bradycardia, drug induced 06/11/2016  . Edema extremities   . GERD (gastroesophageal reflux disease)   . Glaucoma   . Osteoarthritis   . PVD (peripheral vascular disease) ([place])   . Seasonal allergies   . Stroke [facility])     Past  Surgical History:  Procedure Laterality Date  . ABDOMINAL HYSTERECTOMY  1981  . TONSILLECTOMY  1953    Allergies  Allergen Reactions  . Aspirin Other (See Comments)    sweats  . Codeine Other (See Comments)  . Iodine Other (See Comments)    Pt is not aware of this allergy, does not recall much info.  . Prednisone Other (See Comments)    sweats  . Lyrica [Pregabalin]     Wobbly, "more than sleepy"  . Benzonatate Itching and Rash  . Sulfa Antibiotics Other (See Comments)    Doesn't remember    Outpatient Encounter Medications as of 11/27/2017  Medication Sig  . acetaminophen (TYLENOL) 500 MG tablet Take 1 tablet (500 mg total) by mouth every 6 (six) hours as needed for moderate pain.  Marland Kitchen albuterol (PROVENTIL HFA;VENTOLIN HFA) 108 (90 Base) MCG/ACT inhaler Inhale 2 puffs into the lungs every 6 (six) hours as needed for wheezing.  Marland Kitchen alum hydroxide-mag trisilicate (GAVISCON) 05-39 MG CHEW chewable tablet Chew 1 tablet by mouth 3 (three) times daily as needed for flatulence (bowel spasms).  Marland Kitchen amLODipine (NORVASC) 5 MG tablet TAKE ONE TABLET BY MOUTH ONCE DAILY  . atorvastatin (LIPITOR) 10 MG tablet TAKE 1 TABLET BY MOUTH EVERY DAY  . B Complex Vitamins (B COMPLEX PO) Take 1 tablet  by mouth daily.  . bimatoprost (LUMIGAN) 0.01 % SOLN Place 1 drop into both eyes at bedtime.  . Brinzolamide-Brimonidine (SIMBRINZA) 1-0.2 % SUSP Apply 1 drop to eye 3 (three) times daily.  . Calcium Carbonate-Vitamin D (CALTRATE 600+D) 600-400 MG-UNIT per tablet Take 2 tablets by mouth daily.  . Cholecalciferol (VITAMIN D3) 2000 units TABS Take 2,000 Units by mouth daily.  . clopidogrel (PLAVIX) 75 MG tablet TAKE 1 TABLET (75 MG TOTAL) BY MOUTH DAILY.  Marland Kitchen Coenzyme Q10 (COQ10) 50 MG CAPS Take 50 mg by mouth daily.  Marland Kitchen docusate sodium (COLACE) 100 MG capsule Take 1 capsule (100 mg total) by mouth daily as needed for mild constipation (to soften stool).  . fluticasone (FLONASE) 50 MCG/ACT nasal spray Place 1 spray  into the nose daily.  Marland Kitchen loteprednol (LOTEMAX) 0.5 % ophthalmic suspension Place 1 drop into both eyes daily as needed (for dry eyes).   . Multiple Vitamins-Minerals (SENIOR MULTIVITAMIN PLUS) TABS Take 1 tablet by mouth daily.   . nebivolol (BYSTOLIC) 5 MG tablet TAKE AS DIRECTED PLEASE KEEP UPCOMING APPOINTMENT FOR FUTURE REFILLS, THANK YOU!  . nebivolol (BYSTOLIC) 5 MG tablet Take 1 tablet (5 mg total) by mouth daily. Please keep 3/26 at 3 pm appointment for additional refills thanks.  . NONFORMULARY OR COMPOUNDED ITEM APPLY 1 2 GMS TO AFFECTED AREA 3 4 TIMES DAILY AS NEEDED FOR PAIN  THIS IS A COMPOUNDED CREAM 1 PUMP EQUALS 1 GRAM  . NONFORMULARY OR COMPOUNDED ITEM [facility]:  Peripheral Neuropathy cream - Bupivacaine 1%, Doxepin 3%, Gabapentin 6%, Pentoxifylline 3%, Topiramate 1%, apply 1-2 gramsto affected are 3-4 times daily.  . NONFORMULARY OR COMPOUNDED ITEM APPLY 1 2 GMS TO AFFECTED AREA 3 4 TIMES DAILY AS NEEDED FOR PAIN  THIS IS A COMPOUNDED CREAM 1 PUMP EQUALS 1 GRAM  . nystatin cream (MYCOSTATIN) Apply 1 application topically 3 (three) times daily.  . Omega-3 Fatty Acids (FISH OIL) 1200 MG CPDR Take 1,200 mg by mouth daily. Take one tablet by mouth once daily   . pantoprazole (PROTONIX) 40 MG tablet Take 1 tablet (40 mg total) by mouth daily.  . RESTASIS 0.05 % ophthalmic emulsion Place 1 drop into both eyes 2 (two) times daily.   . risperiDONE (RISPERDAL) 0.5 MG tablet TAKE 1 TABLET BY MOUTH TWICE A DAY  . SYMBICORT 80-4.5 MCG/ACT inhaler Place 2 puffs into alternate nostrils 2 (two) times daily.  . vitamin C (ASCORBIC ACID) 500 MG tablet Take 500 mg by mouth daily.   No facility-administered encounter medications on file as of 11/27/2017.     Review of Systems:  Review of Systems  Constitutional: Negative for chills, fever and malaise/fatigue.  HENT: Positive for hearing loss. Negative for congestion.        Follows with Dr. Lynder Parents reviewed  Eyes:        Glasses, now has macular degeneration dx  Respiratory: Negative for cough and shortness of breath.   Cardiovascular: Negative for chest pain, palpitations and leg swelling.  Gastrointestinal: Positive for heartburn. Negative for abdominal pain, blood in stool, constipation and melena.  Genitourinary: Negative for dysuria.  Musculoskeletal: Positive for joint pain. Negative for falls.  Skin: Negative for itching and rash.       Mole below navel  Neurological: Negative for dizziness, loss of consciousness, weakness and headaches.  Endo/Heme/Allergies: Does not bruise/bleed easily.  Psychiatric/Behavioral: Positive for memory loss. Negative for depression and hallucinations. The patient is not nervous/anxious and does not have insomnia.  Health Maintenance  Topic Date Due  . MAMMOGRAM  10/07/2018 (Originally 09/28/2009)  . TETANUS/TDAP  10/07/2018 (Originally 10/04/1943)  . INFLUENZA VACCINE  Completed  . DEXA SCAN  Completed  . PNA vac Low Risk Adult  Completed    Physical Exam: Vitals:   11/27/17 1504  BP: 140/78  Pulse: (!) 52  Temp: 98.2 F (36.8 C)  TempSrc: Oral  SpO2: 97%  Weight: 141 lb (64 kg)   Body mass index is 25.79 kg/m. Physical Exam  Constitutional: She appears well-developed and well-nourished. No distress.  HENT:  Head: Normocephalic and atraumatic.  Cardiovascular: Normal rate, regular rhythm, normal heart sounds and intact distal pulses.  Pulmonary/Chest: Effort normal and breath sounds normal. No respiratory distress.  Abdominal: Bowel sounds are normal.  Musculoskeletal: Normal range of motion.  Uses cane  Neurological: She is alert.  Skin: Skin is warm and dry. Capillary refill takes less than 2 seconds.  Triangular shaped nevus just beneath umbilicus at underwear line, no signs of irritation or tenderness, less than pencil eraser sized  Psychiatric: She has a normal mood and affect.    Labs reviewed: Basic Metabolic Panel: Recent Labs     03/24/17 1200 09/03/17 1117 10/28/17 1210  NA 143 145* 141  K 3.7 3.9 3.9  CL 112* 108* 109  CO2 23 24 25   GLUCOSE 97 113* 97  BUN 18 14 16   CREATININE 0.81 0.91 0.85  CALCIUM 9.1 9.4 9.6  MG  --   --  2.1   Liver Function Tests: Recent Labs    03/24/17 1200 09/03/17 1117  AST 18 21  ALT 16 17  ALKPHOS 75 83  BILITOT 0.9 0.8  PROT 6.5 6.8  ALBUMIN 4.0 4.2   No results for input(s): LIPASE, AMYLASE in the last 8760 hours. No results for input(s): AMMONIA in the last 8760 hours. CBC: Recent Labs    03/24/17 1200  WBC 5.6  NEUTROABS 3,136  HGB 11.8  HCT 36.1  MCV 95.8  PLT 179   Lipid Panel: Recent Labs    03/24/17 1200  CHOL 159  HDL 109  LDLCALC 41  TRIG 46  CHOLHDL 1.5   Lab Results  Component Value Date   HGBA1C 5.6 04/22/2016    Assessment/Plan 1. PVD (peripheral vascular disease) ([place]) -right leg with mild arterial disease on ABIs, cont plavix and exercise program  2. Macular degeneration of left eye, unspecified type -noted newly by Dr. Valetta Close, will request notes after her f/u appt tomorrow  3. Venous insufficiency of both lower extremities -cont compression hose which have helped her (she tried to quit them and swelling returned)  4. Mild intermittent asthma without complication -cont symbicort - albuterol (PROVENTIL HFA;VENTOLIN HFA) 108 (90 Base) MCG/ACT inhaler; Inhale 2 puffs into the lungs every 6 (six) hours as needed for wheezing.  Dispense: 1 Inhaler; Refill: 5  5. Benign essential hypertension -bp satisfactory on current regimen and now w/o dizziness  Labs/tests ordered:  No orders of the defined types were placed in this encounter.  Next appt:  4 mos med mgt   Oris Staffieri L. Lakethia Coppess, D.O. [facility] [address], [place] [address] Cell Phone (Mon-Fri 8am-5pm):  [phone] On Call:  [phone] & follow prompts after 5pm & weekends Office Phone:  [phone] Office Fax:   [phone]

## 2017-11-28 ENCOUNTER — Telehealth: Payer: Self-pay

## 2017-11-28 DIAGNOSIS — H401132 Primary open-angle glaucoma, bilateral, moderate stage: Secondary | ICD-10-CM | POA: Diagnosis not present

## 2017-11-28 NOTE — Telephone Encounter (Signed)
  Spoke with patient who stated that she had a very painful ingrowing toenail. I advised her to make an appt with Dr Jacqualyn Posey to be seen. Transferred to schedulers

## 2017-12-08 ENCOUNTER — Telehealth: Payer: Self-pay | Admitting: Podiatry

## 2017-12-08 NOTE — Telephone Encounter (Signed)
I'm calling on behalf of [name] [name]. She is having a problem with an ingrown toenail which Dr. Jacqualyn Posey did look at when she was there. It is very painful and we would like some information on what she should do about it. If you could Britani at her number which is [phone]. Thank you.

## 2017-12-08 NOTE — Telephone Encounter (Addendum)
I spoke with pt's dtr, Arley Phenix and informed of the instructions in had given pt and recommendation to continue until seen office and that pt wanted her to schedule for the appt. Katharine Look requested the instructions I gave pt for soaks and I then transferred to scheduler.

## 2017-12-08 NOTE — Telephone Encounter (Signed)
I informed pt that Dr. Jacqualyn Posey could perform a procedure that would cut out a part of the toenail that was painful, but she would need an appt, that she could soak the foot in 1/2 cup epsom salt to 1 quart warm water then put neosporin on a bandaid and cover the area, perform daily until seen in office. I asked pt is she would like to schedule and she stated she would prefer her dtr schedule, and for me to call the dtr.

## 2017-12-09 ENCOUNTER — Encounter: Payer: Self-pay | Admitting: Neurology

## 2017-12-09 ENCOUNTER — Ambulatory Visit (INDEPENDENT_AMBULATORY_CARE_PROVIDER_SITE_OTHER): Payer: Medicare Other | Admitting: Neurology

## 2017-12-09 VITALS — BP 136/67 | HR 55 | Wt 144.8 lb

## 2017-12-09 DIAGNOSIS — H409 Unspecified glaucoma: Secondary | ICD-10-CM

## 2017-12-09 DIAGNOSIS — I10 Essential (primary) hypertension: Secondary | ICD-10-CM

## 2017-12-09 DIAGNOSIS — I6340 Cerebral infarction due to embolism of unspecified cerebral artery: Secondary | ICD-10-CM

## 2017-12-09 DIAGNOSIS — E785 Hyperlipidemia, unspecified: Secondary | ICD-10-CM

## 2017-12-09 NOTE — Progress Notes (Signed)
STROKE NEUROLOGY FOLLOW UP NOTE  NAME: [name] [name] DOB: 06-10-1924  REASON FOR VISIT: stroke follow up HISTORY FROM: chart and pt  Today we had the pleasure of seeing [name] [name] in follow-up at our Neurology Clinic. Pt was accompanied by daughter.   History Summary [name] [name] is a 84 y.o. female with history of HTN, PVD, asthma, anxiety admitted on 12/09/15 for vertigo. Symptoms resolved quickly. She did not receive IV t-PA due to resolution of deficits. However, MRI showed left cerebellar cortex and left temporoparietal punctate infarcts, embolic pattern. MRA head and neck unremarkable. TTE unremarkable, LDL 80 and A1C 5.7. She was put on plavix and low dose lipitor on discharge.   02/06/16 follow up - the patient has been doing well. No recurrent stroke like symptoms. However, her BP not in good control. Worked with PCP and currently on amlodipine 42m (she can not tolerate with 129JM and bystolic. Today BP 162/72. No recurrent vertigo. Walk with cane and steady.     05/14/16 follow up - the pt has been doing well. BP better controlled than last time but still not at goal. Today BP 153/67. Patient stated that her BP at home fluctuate, seems in the normal range 3-4 hours after medication. Dr. RMariea Clontsis following up with her BP management. Had 30 day cardiac event monitoring which did not show A. Fib. Other than that, patient no complaints.  11/13/16 follow up - pt has been doing well. No recurrent stroke like symptoms. BP still at high end at home, 150s. Today in clinic 151/82. On amlodipine 576mand bystolic 2m28mShe stated that she can not tolerate higher dose of amlodipine or bystolic. She had recent cold and still coughing, need to schedule to see Dr. ReeMariea Clonts 09/03/17 follow up - pt had numbness and tingling in her hands and feet in glove distribution. EMG/NCS done showed primary axonal polyneuropathy which is consistent with diabetic neuropathy. She was using topic creams with some  effectiveness. Recently, she continues to follow up with ophthalmology for glaucoma treatment. Her right glaucoma is worse than left but she complained to her ophthalmology that she has intermittent left vision loss and found to have left carotid bruits. She was referred back here for evaluation.   On detailed history taking, she said she probably had this issue for more than 6 months. The episodes she felt like a big check board blocking her left eye acutely, no warning sign and no clear triggers. She use lotemax everytime on the left eye and symptom resolved in 2-3 min. Episodes occurs every 6-10 days. She has not tried not using lotemax to see how long it will take. She has not checked to close either eye to see if the visual problem is from one eye or both eyes. She admitted some mild HA at right above eyebrow but not everytime associated with visual loss. She has some left shoulder nagging pain, but not on right shoulder. No left temporal pain or jaw claudication. No fever. BP at home good, but today in clinic 161/65.   Interval History During the interval time, patient vision has been doing better.  Follow with eye doctor Dr. BowValetta Closeeating for glaucoma, and also referred to retinal specialist for macular degeneration.  Intermittent left eye vision loss has resolved and OP has normalized.  Carotid Doppler, ESR and CRP unremarkable.  Her previous left eye vision loss episode relieved by eyedrops likely due to glaucoma.  Occasional frontal headache  relieved by eyedrops, concerning for glaucoma related.  Otherwise, patient has no complaints.  REVIEW OF SYSTEMS: Full 14 system review of systems performed and notable only for those listed below and in HPI above, all others are negative:  Constitutional:   Cardiovascular:  Ear/Nose/Throat: runny nose Skin: moles Eyes:  Light sensitivity, blurry vision, eye pain Respiratory:  Gastroitestinal:  consitipation Genitourinary:  Hematology/Lymphatic:     Endocrine:  Musculoskeletal:  Muscle cramps Allergy/Immunology:    Neurological:   Psychiatric:  Sleep: daytime sleepiness, restless leg  The following represents the patient's updated allergies and side effects list: Allergies  Allergen Reactions  . Aspirin Other (See Comments)    sweats  . Codeine Other (See Comments)  . Iodine Other (See Comments)    Pt is not aware of this allergy, does not recall much info.  . Prednisone Other (See Comments)    sweats  . Lyrica [Pregabalin]     Wobbly, "more than sleepy"  . Benzonatate Itching and Rash  . Sulfa Antibiotics Other (See Comments)    Doesn't remember    The neurologically relevant items on the patient's problem list were reviewed on today's visit.  Neurologic Examination  A problem focused neurological exam (12 or more points of the single system neurologic examination, vital signs counts as 1 point, cranial nerves count for 8 points) was performed.  Blood pressure 136/67, pulse (!) 55, weight 144 lb 12.8 oz (65.7 kg).  General - Well nourished, well developed, in no apparent distress.  Ophthalmologic - Fundi not visualized due to light sensitivity.  Cardiovascular - Regular rate and rhythm.  Neck - no significant bruits bilaterally  Mental Status -  Level of arousal and orientation to time, place, and person were intact. Language including expression, naming, repetition, comprehension was assessed and found intact. Fund of Knowledge was assessed and was intact.  Cranial Nerves II - XII - II - Visual field intact OU.  III, IV, VI - Extraocular movements intact. V - Facial sensation intact bilaterally. VII - Facial movement intact bilaterally. VIII - Hearing & vestibular intact bilaterally. X - Palate elevates symmetrically. XI - Chin turning & shoulder shrug intact bilaterally. XII - Tongue protrusion intact.  Motor Strength - The patient's strength was normal in all extremities and pronator drift was absent.   Bulk was normal and fasciculations were absent.   Motor Tone - Muscle tone was assessed at the neck and appendages and was normal  Reflexes - The patient's reflexes were 1+ in all extremities and she had no pathological reflexes.  Sensory - Light touch, temperature/pinprick were assessed and were normal.    Coordination - The patient had normal movements in the hands and feet with no ataxia or dysmetria.  Tremor was absent.  Gait and Station - walk with cane, slow and mildly shuffling gait, steady no fall.    Data reviewed: I personally reviewed the images and agree with the radiology interpretations.   Ct Head Wo Contrast 12/09/2015  Atrophy with small vessel chronic ischemic changes of deep cerebral white matter. No acute intracranial abnormalities.  MRA HEAD  12/10/2015  Negative intracranial MRA of the brain. No large or proximal arterial branch occlusion. No high-grade or correctable stenosis.   MRA NECK  12/10/2015  1. No flow limiting stenosis or other acute abnormality within the major arterial vasculature of the neck.  2. Tortuosity of the arterial vasculature of the neck, suggesting chronic underlying hypertension.   Mr Brain Wo Contrast 12/09/2015  Punctate acute  infarction in the peripheral left cerebellum and in the peripheral left posterior temporal region, consistent with micro embolic infarctions in the posterior circulation. No large infarction, swelling or hemorrhage. Chronic small vessel ischemic changes elsewhere throughout the brain, similar to the study of 2015.   2D Echocardiogram  - Left ventricle: The cavity size was normal. Wall thickness wasnormal. Systolic function was normal. The estimated ejectionfraction was in the range of 60% to 65%. Wall motion was normal;there were no regional wall motion abnormalities. - Pulmonic valve: There was moderate regurgitation. - Pulmonary arteries: Systolic pressure was mildly increased. PApeak pressure: 32  mm Hg (S). Impressions: No cardiac source of emboli was indentified. Compared to theprior study, there has been no significant interval change.  30 day cardiac event monitoring - no A. Fib  Ct Head Wo Contrast 09/19/2016 IMPRESSION: No evidence of acute intracranial abnormality. Atrophy and chronic small-vessel white matter ischemic changes.    Component     Latest Ref Rng 07/17/2015 12/10/2015  Cholesterol     0 - 200 mg/dL  173  Triglycerides     <150 mg/dL  34  HDL Cholesterol     >40 mg/dL  86  Total CHOL/HDL Ratio       2.0  VLDL     0 - 40 mg/dL  7  LDL (calc)     0 - 99 mg/dL  80  Hemoglobin A1C     4.8 - 5.6 % 5.5 5.7 (H)  Est. average glucose Bld gHb Est-mCnc      111   Mean Plasma Glucose       117    Assessment: As you may recall, she is a 84 y.o. African American female with PMH of HTN, PVD, asthma, anxiety admitted on 12/09/15 for vertigo. Symptoms resolved quickly. MRI showed left cerebellar cortex and left temporoparietal punctate infarcts, embolic pattern. MRA head and neck unremarkable. TTE unremarkable, LDL 80 and A1C 5.7. She was put on plavix and low dose lipitor on discharge. During the interval time, the patient has been doing well. No recurrent stroke like symptoms. Had a 30 day cardiac event monitoring which did not show A. Fib. Had hands and feet numbness, EMG/NCS confirmed polyneuropathy consistent with diabetes.   Started to have episodic left eye visual loss for the last 6 months. Every 6-10 days, use lotemax to the eye and symptoms resolve in 2-3 min. No signifcant neck bruits. Her MRA neck in 2017 was unremarkable. CUS, ESR and CRP all unremarkable. Given her glaucoma hx, eye drop helps vision loss and HA, consistent with glaucoma related event. Continue to follow up with ophthalmology.   Plan:  - continue plavix and lipitor for stroke prevention. - check BP at home and record and bring over to [name] for medication adjustment if needed. - follow  up with eye doctor for glaucoma and macular degeneration treatment. - Follow up with your primary care physician for stroke risk factor modification. Recommend maintain blood pressure goal 130/80, diabetes with hemoglobin A1c goal below 6.5% and lipids with LDL cholesterol goal below 70 mg/dL.  - healthy diet and regular exercise. - follow up as needed.    No orders of the defined types were placed in this encounter.   No orders of the defined types were placed in this encounter.   Patient Instructions  - continue plavix and lipitor for stroke prevention. - check BP at home and record and bring over to [name] for medication adjustment if needed. -  follow up with eye doctor for glaucoma and macular degeneration treatment. - Follow up with your primary care physician for stroke risk factor modification. Recommend maintain blood pressure goal 130/80, diabetes with hemoglobin A1c goal below 6.5% and lipids with LDL cholesterol goal below 70 mg/dL.  - healthy diet and regular exercise. - follow up as needed.    Rosalin Hawking, MD PhD [facility] [address] [place], [place] [address] [phone]

## 2017-12-09 NOTE — Patient Instructions (Addendum)
-   continue plavix and lipitor for stroke prevention. - check BP at home and record and bring over to [name] for medication adjustment if needed. - follow up with eye doctor for glaucoma and macular degeneration treatment. - Follow up with your primary care physician for stroke risk factor modification. Recommend maintain blood pressure goal 130/80, diabetes with hemoglobin A1c goal below 6.5% and lipids with LDL cholesterol goal below 70 mg/dL.  - healthy diet and regular exercise. - follow up as needed.

## 2017-12-11 ENCOUNTER — Ambulatory Visit (INDEPENDENT_AMBULATORY_CARE_PROVIDER_SITE_OTHER): Payer: Medicare Other | Admitting: Podiatry

## 2017-12-11 ENCOUNTER — Encounter: Payer: Self-pay | Admitting: Podiatry

## 2017-12-11 DIAGNOSIS — L84 Corns and callosities: Secondary | ICD-10-CM

## 2017-12-11 DIAGNOSIS — M79674 Pain in right toe(s): Secondary | ICD-10-CM

## 2017-12-11 DIAGNOSIS — I6340 Cerebral infarction due to embolism of unspecified cerebral artery: Secondary | ICD-10-CM | POA: Diagnosis not present

## 2017-12-11 DIAGNOSIS — H353131 Nonexudative age-related macular degeneration, bilateral, early dry stage: Secondary | ICD-10-CM | POA: Diagnosis not present

## 2017-12-11 NOTE — Patient Instructions (Addendum)
Monitor for any signs/symptoms of infection. Call the office immediately if any occur or go directly to the emergency room. Call with any questions/concerns.  

## 2017-12-11 NOTE — Progress Notes (Signed)
Subjective: [name] [name] presents to the office today for concerns of pain to the right 4th toe.  She states that the toe has been hurting for a couple months.  She states that she had pain prior to I saw her last appointment me to trim the nails and it did help for short amount time but the pain has returned to the fourth toe she points the lateral aspect.  It hurts inside shoes and pressure.  She denies any open sores or any swelling or any recent injury.  She has no other concerns.  enies any systemic complaints such as fevers, chills, nausea, vomiting. No acute changes since last appointment, and no other complaints at this time.   Objective: AAO x3, NAD DP/PT pulses palpable bilaterally, CRT less than 3 seconds Thick hyperkeratotic lesions present to the lateral aspect of the right fourth digit toe just adjacent to the toenail itself.  Upon debridement there is no underlying ulceration, drainage or any signs of infection but is able to debride quite a bit of keratotic skin.  There is tenderness shortly to this lesion but no other areas of tenderness identified. No open lesions or pre-ulcerative lesions.  No pain with calf compression, swelling, warmth, erythema  Assessment: Skin lesion right fourth toe  Plan: -All treatment options discussed with the patient including all alternatives, risks, complications.  -Sharply debrided the hyperkeratotic tissue.  Very minimal bleeding occurred.  The area with alcohol and topical ointment was applied.  He can do think that appointment so this is completely healed.  She can switch to moisturizer to help soften the area.  Offloading pads were dispensed.  Monitor for infection.  Follow-up as scheduled or sooner if needed. -Patient encouraged to call the office with any questions, concerns, change in symptoms.    [name] [name] DPM

## 2017-12-30 ENCOUNTER — Ambulatory Visit (INDEPENDENT_AMBULATORY_CARE_PROVIDER_SITE_OTHER): Payer: Medicare Other | Admitting: Cardiology

## 2017-12-30 ENCOUNTER — Encounter: Payer: Self-pay | Admitting: Cardiology

## 2017-12-30 VITALS — BP 160/90 | HR 55 | Ht 62.0 in | Wt 142.0 lb

## 2017-12-30 DIAGNOSIS — I10 Essential (primary) hypertension: Secondary | ICD-10-CM | POA: Diagnosis not present

## 2017-12-30 DIAGNOSIS — R001 Bradycardia, unspecified: Secondary | ICD-10-CM

## 2017-12-30 DIAGNOSIS — T50905A Adverse effect of unspecified drugs, medicaments and biological substances, initial encounter: Secondary | ICD-10-CM

## 2017-12-30 DIAGNOSIS — R600 Localized edema: Secondary | ICD-10-CM | POA: Diagnosis not present

## 2017-12-30 DIAGNOSIS — I6340 Cerebral infarction due to embolism of unspecified cerebral artery: Secondary | ICD-10-CM

## 2017-12-30 MED ORDER — NEBIVOLOL HCL 2.5 MG PO TABS: 2.5000 mg | ORAL_TABLET | Freq: Every day | ORAL | 11 refills | Status: DC

## 2017-12-30 MED ORDER — AMLODIPINE BESYLATE 5 MG PO TABS: 7.5000 mg | ORAL_TABLET | Freq: Every day | ORAL | 0 refills | Status: DC

## 2017-12-30 NOTE — Patient Instructions (Signed)
Medication Instructions:  Your physician has recommended you make the following change in your medication:  DECREASE: Bystolic to 2.5 mg one time a day INCREASE: amlodipine to 7.5 mg (1.5 tablets) one time a day   If you need a refill on your cardiac medications, please contact your pharmacy first.  Labwork: None ordered   Testing/Procedures: None ordered   Follow-Up: Your physician recommends that you schedule a follow-up appointment in: 3 weeks at the hypertension clinic  Your physician wants you to follow-up in: 6 months with Dr. Radford Pax. You will receive a reminder letter in the mail two months in advance. If you don't receive a letter, please call our office to schedule the follow-up appointment.  Any Other Special Instructions Will Be Listed Below (If Applicable).   Thank you for choosing [facility], RN  [phone]  If you need a refill on your cardiac medications before your next appointment, please call your pharmacy.

## 2017-12-30 NOTE — Progress Notes (Signed)
Cardiology Office Note:    Date:  12/30/2017   ID:  [name] [name], DOB January 19, 1924, MRN [id number]  PCP:  Gayland Curry, DO  Cardiologist:  No primary care provider on file.    Referring MD: Gayland Curry, DO   Chief Complaint  Patient presents with  . Hypertension    History of Present Illness:    [name] [name] is a 84 y.o. female with a hx of HTN, asthma, chronic LE edema and bradycardia.  She is here today for followup and is doing well.  She denies any chest pain or pressure, SOB, DOE, PND, orthopnea,  dizziness, palpitations or syncope. She occasionally has some ankle edema.  She is compliant with her meds and is tolerating meds with no SE.    Past Medical History:  Diagnosis Date  . Anxiety   . Asthma   . Benign essential hypertension   . Bradycardia, drug induced 06/11/2016  . Edema extremities   . GERD (gastroesophageal reflux disease)   . Glaucoma   . Osteoarthritis   . PVD (peripheral vascular disease) ([place])   . Seasonal allergies   . Stroke [facility])     Past Surgical History:  Procedure Laterality Date  . ABDOMINAL HYSTERECTOMY  1981  . TONSILLECTOMY  1953    Current Medications: Current Meds  Medication Sig  . acetaminophen (TYLENOL) 500 MG tablet Take 1 tablet (500 mg total) by mouth every 6 (six) hours as needed for moderate pain.  Marland Kitchen albuterol (PROVENTIL HFA;VENTOLIN HFA) 108 (90 Base) MCG/ACT inhaler Inhale 2 puffs into the lungs every 6 (six) hours as needed for wheezing.  Marland Kitchen alum hydroxide-mag trisilicate (GAVISCON) 80-99 MG CHEW chewable tablet Chew 1 tablet by mouth 3 (three) times daily as needed for flatulence (bowel spasms).  Marland Kitchen amLODipine (NORVASC) 5 MG tablet TAKE ONE TABLET BY MOUTH ONCE DAILY  . atorvastatin (LIPITOR) 10 MG tablet TAKE 1 TABLET BY MOUTH EVERY DAY  . B Complex Vitamins (B COMPLEX PO) Take 1 tablet by mouth daily.  . bimatoprost (LUMIGAN) 0.01 % SOLN Place 1 drop into both eyes at bedtime.  . Brinzolamide-Brimonidine  (SIMBRINZA) 1-0.2 % SUSP Apply 1 drop to eye 3 (three) times daily.  . Calcium Carbonate-Vitamin D (CALTRATE 600+D) 600-400 MG-UNIT per tablet Take 2 tablets by mouth daily.  . Cholecalciferol (VITAMIN D3) 2000 units TABS Take 2,000 Units by mouth daily.  . clopidogrel (PLAVIX) 75 MG tablet TAKE 1 TABLET (75 MG TOTAL) BY MOUTH DAILY.  Marland Kitchen Coenzyme Q10 (COQ10) 50 MG CAPS Take 50 mg by mouth daily.  Marland Kitchen docusate sodium (COLACE) 100 MG capsule Take 1 capsule (100 mg total) by mouth daily as needed for mild constipation (to soften stool).  . fluticasone (FLONASE) 50 MCG/ACT nasal spray Place 1 spray into the nose daily.  Marland Kitchen loteprednol (LOTEMAX) 0.5 % ophthalmic suspension Place 1 drop into both eyes daily as needed (for dry eyes).   . Multiple Vitamins-Minerals (SENIOR MULTIVITAMIN PLUS) TABS Take 1 tablet by mouth daily.   . nebivolol (BYSTOLIC) 5 MG tablet TAKE AS DIRECTED PLEASE KEEP UPCOMING APPOINTMENT FOR FUTURE REFILLS, THANK YOU!  . nebivolol (BYSTOLIC) 5 MG tablet Take 1 tablet (5 mg total) by mouth daily. Please keep 3/26 at 3 pm appointment for additional refills thanks.  . NONFORMULARY OR COMPOUNDED ITEM [facility]:  Peripheral Neuropathy cream - Bupivacaine 1%, Doxepin 3%, Gabapentin 6%, Pentoxifylline 3%, Topiramate 1%, apply 1-2 gramsto affected are 3-4 times daily.  . NONFORMULARY OR COMPOUNDED ITEM  APPLY 1 2 GMS TO AFFECTED AREA 3 4 TIMES DAILY AS NEEDED FOR PAIN  THIS IS A COMPOUNDED CREAM 1 PUMP EQUALS 1 GRAM  . nystatin cream (MYCOSTATIN) Apply 1 application topically 3 (three) times daily.  . Omega-3 Fatty Acids (FISH OIL) 1200 MG CPDR Take 1,200 mg by mouth daily. Take one tablet by mouth once daily   . pantoprazole (PROTONIX) 40 MG tablet Take 1 tablet (40 mg total) by mouth daily.  . RESTASIS 0.05 % ophthalmic emulsion Place 1 drop into both eyes 2 (two) times daily.   . risperiDONE (RISPERDAL) 0.5 MG tablet TAKE 1 TABLET BY MOUTH TWICE A DAY  . SYMBICORT 80-4.5 MCG/ACT  inhaler Place 2 puffs into alternate nostrils 2 (two) times daily.  . vitamin C (ASCORBIC ACID) 500 MG tablet Take 500 mg by mouth daily.     Allergies:   Aspirin; Codeine; Iodine; Prednisone; Lyrica [pregabalin]; Benzonatate; and Sulfa antibiotics   Social History   Socioeconomic History  . Marital status: Widowed    Spouse name: Not on file  . Number of children: Not on file  . Years of education: Not on file  . Highest education level: Not on file  Occupational History  . Not on file  Social Needs  . Financial resource strain: Not on file  . Food insecurity:    Worry: Not on file    Inability: Not on file  . Transportation needs:    Medical: Not on file    Non-medical: Not on file  Tobacco Use  . Smoking status: Never Smoker  . Smokeless tobacco: Never Used  Substance and Sexual Activity  . Alcohol use: No    Alcohol/week: 0.0 oz  . Drug use: No  . Sexual activity: Not Currently  Lifestyle  . Physical activity:    Days per week: Not on file    Minutes per session: Not on file  . Stress: Not on file  Relationships  . Social connections:    Talks on phone: Not on file    Gets together: Not on file    Attends religious service: Not on file    Active member of club or organization: Not on file    Attends meetings of clubs or organizations: Not on file    Relationship status: Not on file  Other Topics Concern  . 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  ROS:   Please see the history of present illness.    ROS  All other systems reviewed and negative.   EKGs/Labs/Other Studies Reviewed:    The following studies were reviewed today: none  EKG:  EKG is  ordered today.  The ekg ordered today demonstrates  Sinus bradycardia at 48bpm with RBBB  Recent Labs: 03/24/2017: Hemoglobin 11.8; Platelets 179 09/03/2017: ALT 17 10/28/2017: BUN 16; Creat 0.85; Magnesium 2.1; Potassium 3.9; Sodium 141   Recent Lipid Panel    Component Value Date/Time   CHOL 159 03/24/2017 1200   CHOL 187 08/17/2013 0836   TRIG 46 03/24/2017 1200   HDL 109 03/24/2017 1200   HDL 105 08/17/2013 0836   CHOLHDL 1.5 03/24/2017 1200   VLDL 9 03/24/2017 1200   LDLCALC 41 03/24/2017 1200   LDLCALC  73 08/17/2013 0836    Physical Exam:    VS:  BP (!) 160/90   Pulse (!) 55   Ht 5\' 2"  (1.575 m)   Wt 142 lb (64.4 kg)   SpO2 98%   BMI 25.97 kg/m     Wt Readings from Last 3 Encounters:  12/30/17 142 lb (64.4 kg)  12/09/17 144 lb 12.8 oz (65.7 kg)  11/27/17 141 lb (64 kg)     GEN:  Well nourished, well developed in no acute distress HEENT: Normal NECK: No JVD; No carotid bruits LYMPHATICS: No lymphadenopathy CARDIAC: RRR, no murmurs, rubs, gallops RESPIRATORY:  Clear to auscultation without rales, wheezing or rhonchi  ABDOMEN: Soft, non-tender, non-distended MUSCULOSKELETAL:  No edema; No deformity  SKIN: Warm and dry NEUROLOGIC:  Alert and oriented x 3 PSYCHIATRIC:  Normal affect   ASSESSMENT:    1. Essential hypertension   2. Edema extremities   3. Bradycardia, drug induced    PLAN:    In order of problems listed above:  1.  HTN - BP is borderline controlled on exam today.  I have instructed her to decrease her Bystolic to 2.5mg  daily and increase amlodipine 7.5mg  daily since her HR is too low.  She says at home her BP runs 140/78mmHg.  I have asked her to check her BP daily and bring her readings with her to HTN clinic in 3 weeks.  2.  Chronic LE edema - controlled off diuretics.    3.  Drug induced Bradycardia - asymptomatic   Medication Adjustments/Labs and Tests Ordered: Current medicines are reviewed at length with the patient today.  Concerns regarding medicines are outlined above.  No  orders of the defined types were placed in this encounter.  No orders of the defined types were placed in this encounter.   Signed, [name] Him, MD  12/30/2017 3:17 PM    [place]

## 2018-01-04 IMAGING — MR MR HEAD W/O CM
9 of 10 series · 38 of 48 positions shown · non-contrast
Comparison: CT same day.  MRI 06/10/2014.

CLINICAL DATA: Intermittent dizzy spells, most recent 1 earlier
today.

EXAM:
MRI HEAD WITHOUT CONTRAST
TECHNIQUE: Multiplanar, multiecho pulse sequences of the brain and surrounding
structures were obtained without intravenous contrast.

[Series 3: DWI · axial · 3.0mm · 1.09mm/px · z∈[-34,+122]mm · 11 of 106 slices shown (1 of 4)]
[im 1/106]
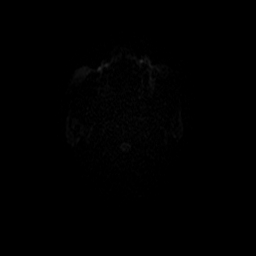
[im 11/106]
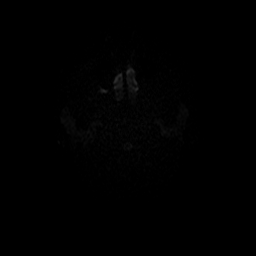
[im 22/106]
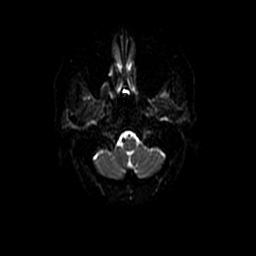
[im 32/106]
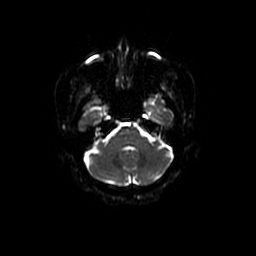
[im 43/106]
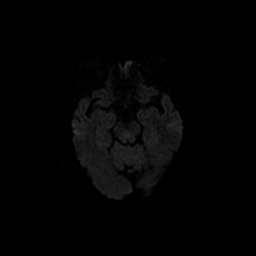
[im 53/106]
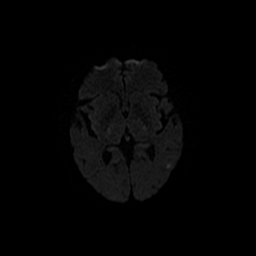
[im 64/106]
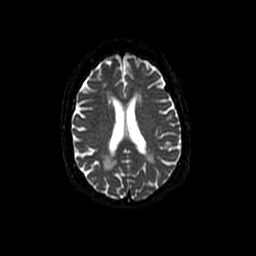
[im 74/106]
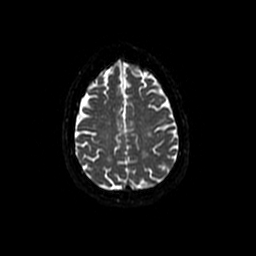
[im 85/106]
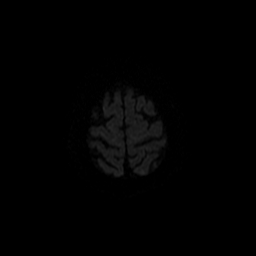
[im 95/106]
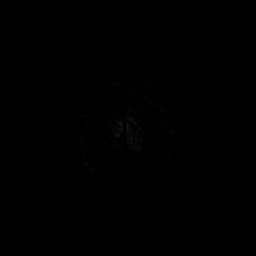
[im 106/106]
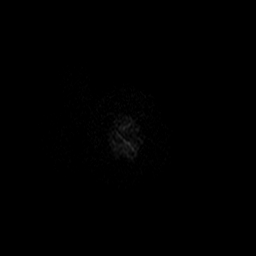

[Series 4: T1 · sagittal · 5.0mm · 0.47mm/px · 3 of 23 slices shown]
[im 1/23]
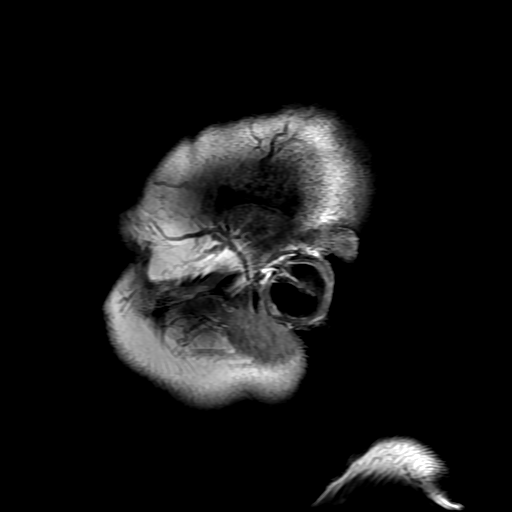
[im 12/23]
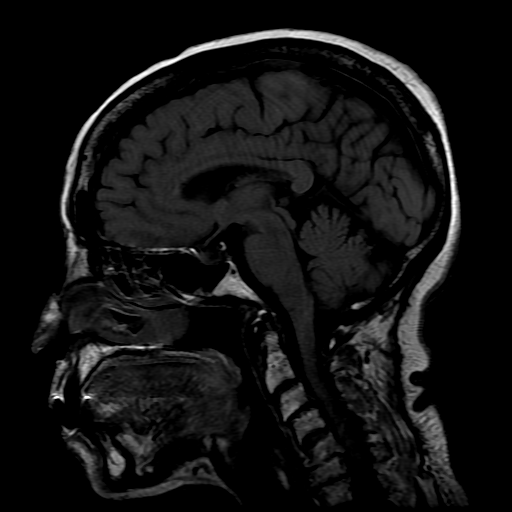
[im 23/23]
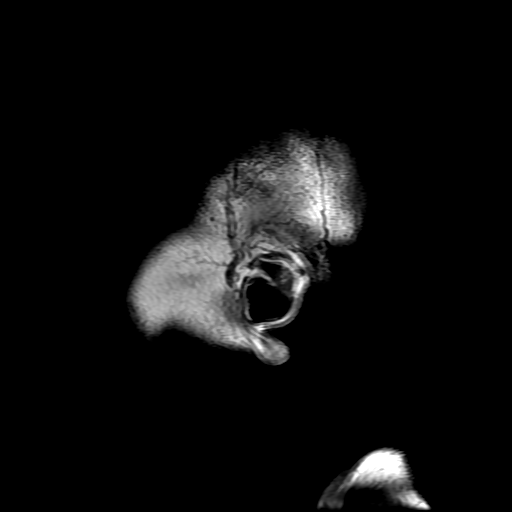

[Series 5: T2 · axial · 5.0mm · 0.43mm/px · z∈[-20,+118]mm · 2 of 24 slices shown]
[im 1/24]
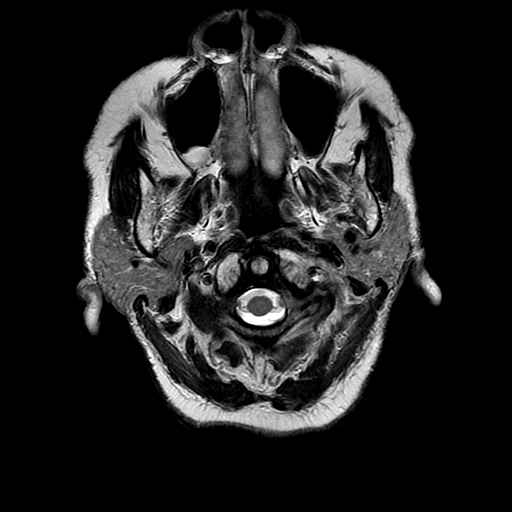
[im 24/24]
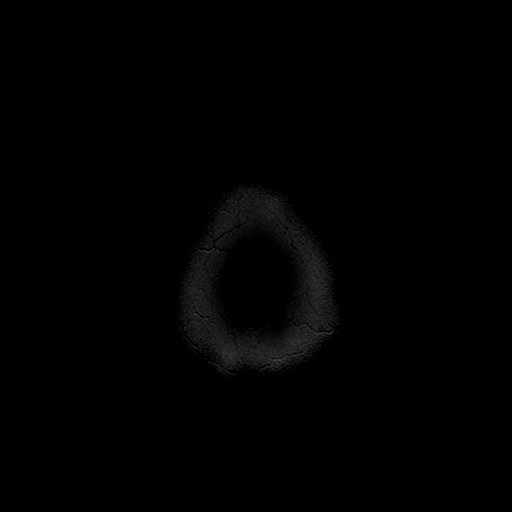

[Series 6: DWI · coronal · 5.0mm · 1.09mm/px · 7 of 72 slices shown (2 of 4)]
[im 1/72]
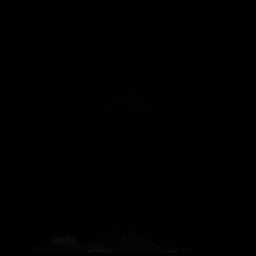
[im 12/72]
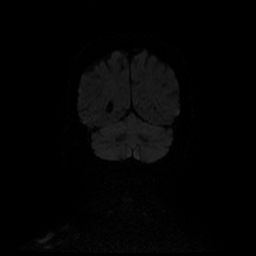
[im 24/72]
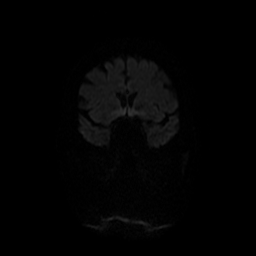
[im 36/72]
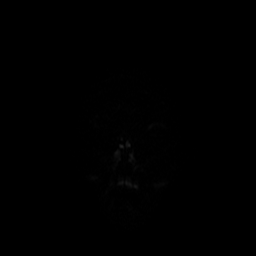
[im 48/72]
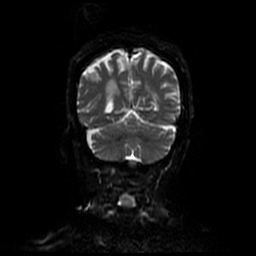
[im 60/72]
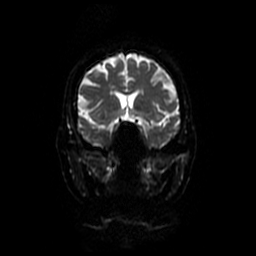
[im 72/72]
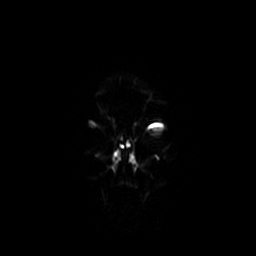

[Series 7: FLAIR · axial · 5.0mm · 0.43mm/px · z∈[-20,+118]mm · 2 of 24 slices shown]
[im 1/24]
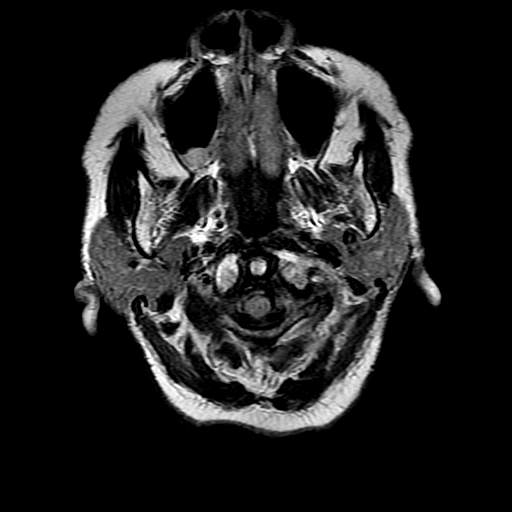
[im 24/24]
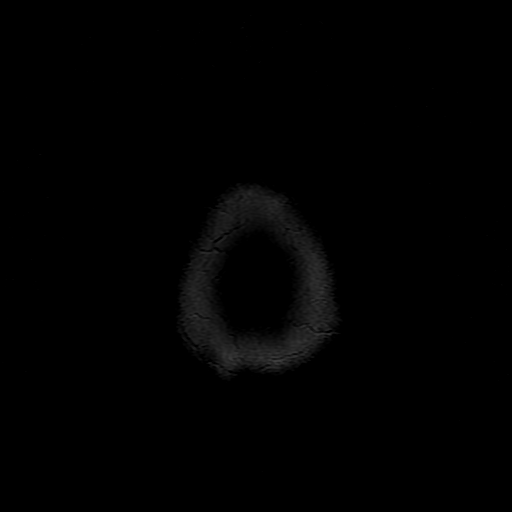

[Series 8: ax mpgr · axial · 5.0mm · 0.43mm/px · 1 of 24 slices shown]
[im 1/24]
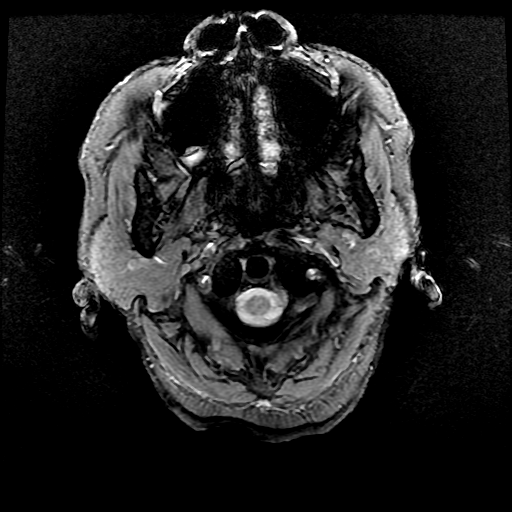

[Series 10: T2 post-contrast · coronal · 5.0mm · 0.39mm/px · 3 of 28 slices shown]
[im 1/28]
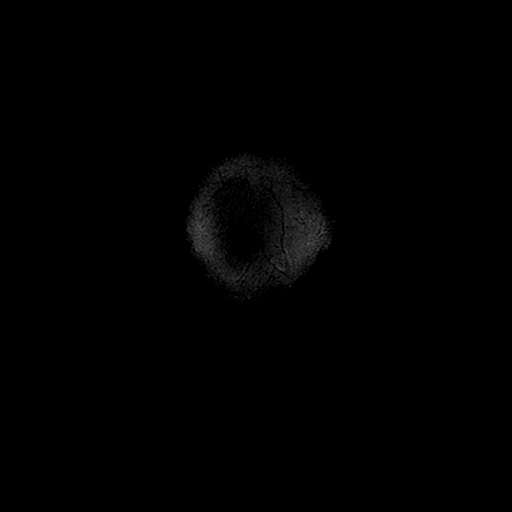
[im 14/28]
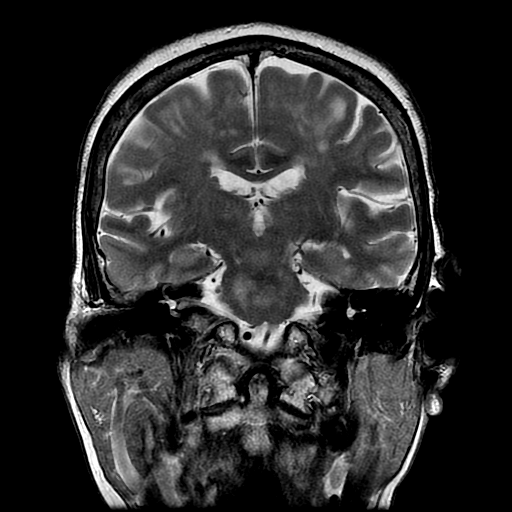
[im 28/28]
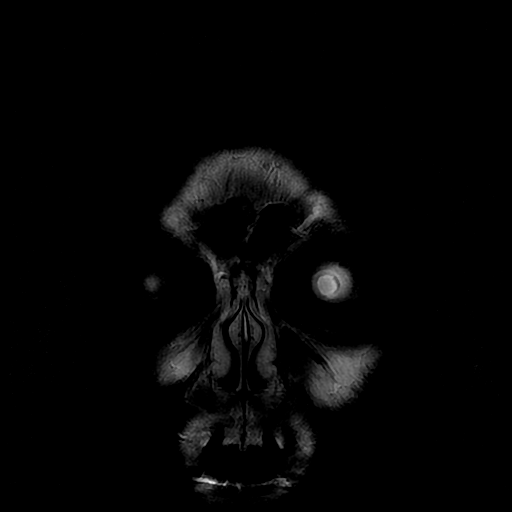

[Series 300: DWI · axial · 3.0mm · 1.09mm/px · z∈[-34,+122]mm · 5 of 53 slices shown (3 of 4)]
[im 1/53]
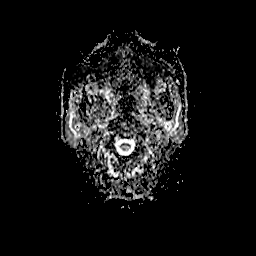
[im 14/53]
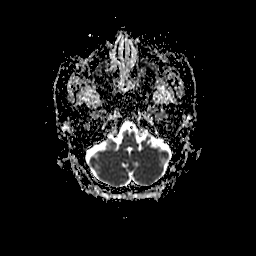
[im 27/53]
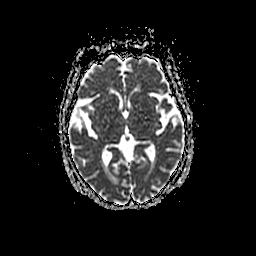
[im 40/53]
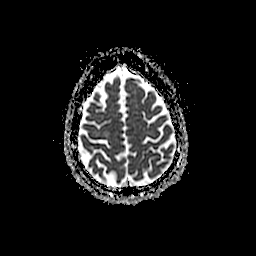
[im 53/53]
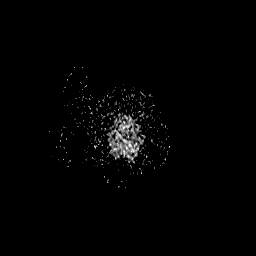

[Series 600: DWI · coronal · 5.0mm · 1.09mm/px · 4 of 36 slices shown (4 of 4)]
[im 1/36]
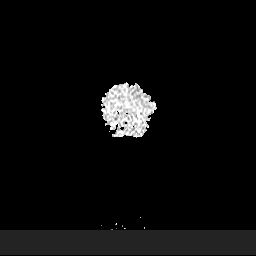
[im 12/36]
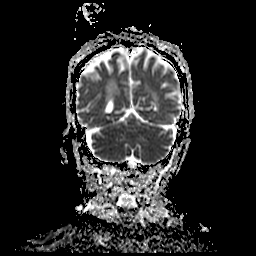
[im 24/36]
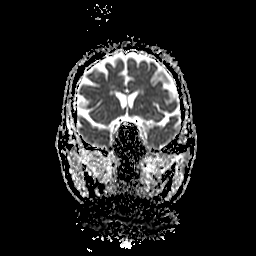
[im 36/36]
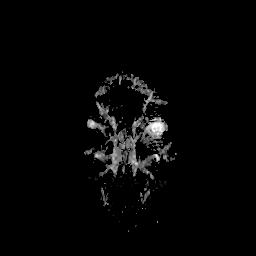

[38 of 48 positions shown; findings below may reference images not displayed]

FINDINGS: Diffusion imaging shows a punctate acute infarction in the periphery
of the inferior cerebellum on the left. There is a 4 mm acute
infarction at the right posterior temporal cortex. Both of these are
consistent with micro embolic infarctions in the posterior
circulation. No sign of swelling or hemorrhage.

There chronic small-vessel ischemic changes throughout the pons.
Cerebral hemispheres elsewhere show chronic small-vessel ischemic
changes within the deep and subcortical white matter. No large
vessel territory infarction. No mass lesion, hemorrhage,
hydrocephalus or extra-axial collection. No pituitary mass. No
significant sinus disease.
IMPRESSION: Punctate acute infarction in the peripheral left cerebellum and in
the peripheral left posterior temporal region, consistent with micro
embolic infarctions in the posterior circulation. No large
infarction, swelling or hemorrhage.

Chronic small vessel ischemic changes elsewhere throughout the
brain, similar to the study of 2988.

## 2018-01-11 ENCOUNTER — Other Ambulatory Visit: Payer: Self-pay | Admitting: Internal Medicine

## 2018-01-13 DIAGNOSIS — H401132 Primary open-angle glaucoma, bilateral, moderate stage: Secondary | ICD-10-CM | POA: Diagnosis not present

## 2018-01-21 ENCOUNTER — Other Ambulatory Visit: Payer: Self-pay | Admitting: Cardiology

## 2018-01-24 ENCOUNTER — Other Ambulatory Visit: Payer: Self-pay | Admitting: Internal Medicine

## 2018-01-25 ENCOUNTER — Other Ambulatory Visit: Payer: Self-pay | Admitting: Cardiology

## 2018-01-27 ENCOUNTER — Ambulatory Visit (INDEPENDENT_AMBULATORY_CARE_PROVIDER_SITE_OTHER): Payer: Medicare Other | Admitting: Pharmacist

## 2018-01-27 ENCOUNTER — Ambulatory Visit (INDEPENDENT_AMBULATORY_CARE_PROVIDER_SITE_OTHER): Payer: Medicare Other | Admitting: Podiatry

## 2018-01-27 ENCOUNTER — Encounter: Payer: Self-pay | Admitting: Podiatry

## 2018-01-27 VITALS — BP 124/70 | HR 49

## 2018-01-27 DIAGNOSIS — B351 Tinea unguium: Secondary | ICD-10-CM

## 2018-01-27 DIAGNOSIS — I10 Essential (primary) hypertension: Secondary | ICD-10-CM | POA: Diagnosis not present

## 2018-01-27 DIAGNOSIS — M79676 Pain in unspecified toe(s): Secondary | ICD-10-CM

## 2018-01-27 DIAGNOSIS — G5793 Unspecified mononeuropathy of bilateral lower limbs: Secondary | ICD-10-CM | POA: Diagnosis not present

## 2018-01-27 DIAGNOSIS — I6340 Cerebral infarction due to embolism of unspecified cerebral artery: Secondary | ICD-10-CM

## 2018-01-27 DIAGNOSIS — Q828 Other specified congenital malformations of skin: Secondary | ICD-10-CM

## 2018-01-27 DIAGNOSIS — Z7901 Long term (current) use of anticoagulants: Secondary | ICD-10-CM

## 2018-01-27 NOTE — Progress Notes (Signed)
[name]                 DOB: 08/05/1924                      MRN: [id number]     HPI: [name] [name] is a 84 y.o. female referred by Dr. Radford [name] to HTN clinic. She was previously seen in HTN clinic in 2017. PMH is significant for HTN, PVD, stroke, bradycardia, chronic LE edema, GERD, and asthma. At her last visit 1 month ago, BP was elevated at 160/65mmHg with HR of 55. Bystolic was decreased to 2.5mg  daily and amlodipine was increased to 7.5mg  daily. She presents today for follow up.  Patient presents today with her daughter in good spirits. She reports tolerating her medications well. She denies dizziness, blurred vision, falls, or headaches. She ambulates well with a cane. Pt has a good understanding of her medications. She denies LEE since increasing amlodipine dose. Her home BP readings have been excellent and have ranged 115/62 - 130s/50-60s with one high reading of 151/64. HR mostly in the 50s. BP excellent today as well however HR remains low, between 48-55 checked multiple times.  Current HTN meds: Bystolic 2.5mg  daily, amlodipine 7.5mg  daily Previously tried: lisinopril - dizziness BP goal: <150/58mmHg due to age  Family History: The patient's family history includes Diabetes in her mother; Heart disease in her mother; Hypertension in her father and mother; Stroke in her father.  Social History: Denies alcohol, tobacco, and illicit drug use.  Diet: Eats at home mostly. Likes chicken and greens. Does not add salt to food. Drinks water. Drinks 1/2 cup of coffee occasionally.  Exercise: Ambulates well with a cane.  Home BP readings: Improved to 115/62 - 130s/50-60s, one high reading of 151/64. HR 50s -60.   Wt Readings from Last 3 Encounters:  12/30/17 142 lb (64.4 kg)  12/09/17 144 lb 12.8 oz (65.7 kg)  11/27/17 141 lb (64 kg)   BP Readings from Last 3 Encounters:  12/30/17 (!) 160/90  12/09/17 136/67  11/27/17 140/78   Pulse Readings from Last 3 Encounters:   12/30/17 (!) 55  12/09/17 (!) 55  11/27/17 (!) 52    Renal function: CrCl cannot be calculated (Patient's most recent lab result is older than the maximum 21 days allowed.).  Past Medical History:  Diagnosis Date  . Anxiety   . Asthma   . Benign essential hypertension   . Bradycardia, drug induced 06/11/2016  . Edema extremities   . GERD (gastroesophageal reflux disease)   . Glaucoma   . Osteoarthritis   . PVD (peripheral vascular disease) ([place])   . Seasonal allergies   . Stroke [facility])     Current Outpatient Medications on File Prior to Visit  Medication Sig Dispense Refill  . acetaminophen (TYLENOL) 500 MG tablet Take 1 tablet (500 mg total) by mouth every 6 (six) hours as needed for moderate pain. 30 tablet 0  . albuterol (PROVENTIL HFA;VENTOLIN HFA) 108 (90 Base) MCG/ACT inhaler Inhale 2 puffs into the lungs every 6 (six) hours as needed for wheezing. 1 Inhaler 5  . alum hydroxide-mag trisilicate (GAVISCON) 97-53 MG CHEW chewable tablet Chew 1 tablet by mouth 3 (three) times daily as needed for flatulence (bowel spasms). 1 each 0  . amLODipine (NORVASC) 5 MG tablet TAKE 1.5 TABLETS (7.5 MG TOTAL) BY MOUTH DAILY. 45 tablet 11  . atorvastatin (LIPITOR) 10 MG tablet TAKE 1 TABLET BY MOUTH EVERY DAY  90 tablet 1  . B Complex Vitamins (B COMPLEX PO) Take 1 tablet by mouth daily.    . bimatoprost (LUMIGAN) 0.01 % SOLN Place 1 drop into both eyes at bedtime.    . Brinzolamide-Brimonidine (SIMBRINZA) 1-0.2 % SUSP Apply 1 drop to eye 3 (three) times daily.    Marland Kitchen BYSTOLIC 5 MG tablet PLEASE SEE ATTACHED FOR DETAILED DIRECTIONS 90 tablet 3  . Calcium Carbonate-Vitamin D (CALTRATE 600+D) 600-400 MG-UNIT per tablet Take 2 tablets by mouth daily.    . Cholecalciferol (VITAMIN D3) 2000 units TABS Take 2,000 Units by mouth daily.    . clopidogrel (PLAVIX) 75 MG tablet TAKE 1 TABLET (75 MG TOTAL) BY MOUTH DAILY. 90 tablet 1  . Coenzyme Q10 (COQ10) 50 MG CAPS Take 50 mg by mouth daily.    Marland Kitchen  docusate sodium (COLACE) 100 MG capsule Take 1 capsule (100 mg total) by mouth daily as needed for mild constipation (to soften stool). 30 capsule 1  . fluticasone (FLONASE) 50 MCG/ACT nasal spray Place 1 spray into the nose daily.    Marland Kitchen loteprednol (LOTEMAX) 0.5 % ophthalmic suspension Place 1 drop into both eyes daily as needed (for dry eyes).     . Multiple Vitamins-Minerals (SENIOR MULTIVITAMIN PLUS) TABS Take 1 tablet by mouth daily.     . nebivolol (BYSTOLIC) 2.5 MG tablet Take 1 tablet (2.5 mg total) by mouth daily. 30 tablet 11  . NONFORMULARY OR COMPOUNDED ITEM [facility]:  Peripheral Neuropathy cream - Bupivacaine 1%, Doxepin 3%, Gabapentin 6%, Pentoxifylline 3%, Topiramate 1%, apply 1-2 gramsto affected are 3-4 times daily. 120 each 5  . NONFORMULARY OR COMPOUNDED ITEM APPLY 1 2 GMS TO AFFECTED AREA 3 4 TIMES DAILY AS NEEDED FOR PAIN  THIS IS A COMPOUNDED CREAM 1 PUMP EQUALS 1 GRAM  2  . nystatin cream (MYCOSTATIN) Apply 1 application topically 3 (three) times daily.    . Omega-3 Fatty Acids (FISH OIL) 1200 MG CPDR Take 1,200 mg by mouth daily. Take one tablet by mouth once daily     . pantoprazole (PROTONIX) 40 MG tablet Take 1 tablet (40 mg total) by mouth daily. 30 tablet 5  . RESTASIS 0.05 % ophthalmic emulsion Place 1 drop into both eyes 2 (two) times daily.     . risperiDONE (RISPERDAL) 0.5 MG tablet TAKE 1 TABLET BY MOUTH TWICE A DAY 120 tablet 1  . SYMBICORT 80-4.5 MCG/ACT inhaler Place 2 puffs into alternate nostrils 2 (two) times daily.    . vitamin C (ASCORBIC ACID) 500 MG tablet Take 500 mg by mouth daily.     No current facility-administered medications on file prior to visit.     Allergies  Allergen Reactions  . Aspirin Other (See Comments)    sweats  . Codeine Other (See Comments)  . Iodine Other (See Comments)    Pt is not aware of this allergy, does not recall much info.  . Prednisone Other (See Comments)    sweats  . Lyrica [Pregabalin]     Wobbly, "more  than sleepy"  . Benzonatate Itching and Rash  . Sulfa Antibiotics Other (See Comments)    Doesn't remember     Assessment/Plan:  1. Hypertension - BP improved with excellent home readings, all well below goal <150/94mmHg. HR remains low in clinic today 48-55. Will discontinue Bystolic 2.5mg  daily as pt has only been taking this for BP control. Will continue amlodipine 7.5mg  daily and f/u in HTN clinic in 3 weeks. Can increase amlodipine  to 10mg  daily if needed at f/u visit. Encouraged pt to continue to monitor and record her BP readings at home.   Alechia Lezama E. Supple, PharmD, CPP, [facility] [address], [place],  [address] Phone: [phone]; Fax: [phone] 01/27/2018 3:53 PM

## 2018-01-27 NOTE — Patient Instructions (Addendum)
It was nice to see you today  Please STOP taking your Bystolic (nebivolol)  Continue to take your amlodipine 7.5mg  (1 and 1/2 tablets) daily  Continue to record your blood pressure readings at home  Your blood pressure goal is less than 150/64mmHg  Follow up for blood pressure check in 3 weeks.   Call Megan in the blood pressure clinic with any concerns [phone]

## 2018-01-28 NOTE — Progress Notes (Signed)
Subjective: 84 y.o. returns the office today for painful, elongated, thickened toenails which she cannot trim herslef. Denies any redness or drainage around the nails.  She does have a callus left fourth toe.  Denies any acute changes since last appointment and no new complaints today. Denies any systemic complaints such as fevers, chills, nausea, vomiting.   PCP: Gayland Curry, DO  She is on Plavix  Objective: AAO 3, NAD DP/PT pulses palpable, CRT less than 3 seconds Nails hypertrophic, dystrophic, elongated, brittle, discolored 10. There is tenderness overlying the nails 1-5 bilaterally. There is no surrounding erythema or drainage along the nail sites. Hyperkeratotic lesion of the right fourth toe.  After debridement there is no underlying ulceration, drainage or any clinical signs of infection present. No open lesions or pre-ulcerative lesions are identified. No other areas of tenderness bilateral lower extremities. No overlying edema, erythema, increased warmth. No pain with calf compression, swelling, warmth, erythema.  Assessment: Patient presents with symptomatic onychomycosis; hyperkeratotic lesion currently on Plavix  Plan: -Treatment options including alternatives, risks, complications were discussed -Nails sharply debrided 10 without complication/bleeding. -Hyperkeratotic lesion sharply debrided x1 without any complications or bleeding -Discussed daily foot inspection. If there are any changes, to call the office immediately.  -Follow-up in 3 months or sooner if any problems are to arise. In the meantime, encouraged to call the office with any questions, concerns, changes symptoms.  Celesta Gentile, DPM

## 2018-02-09 ENCOUNTER — Telehealth: Payer: Self-pay | Admitting: Cardiology

## 2018-02-09 NOTE — Telephone Encounter (Signed)
New message   Patient's daughter calling to report her mother had other episodes of dizziness over the weekend. Please call   STAT if patient feels like he/she is going to faint   1) Are you dizzy now? Unknown , per daughter  2) Do you feel faint or have you passed out? NO  3) Do you have any other symptoms? dizziness  4) Have you checked your HR and BP (record if available)? Unknown

## 2018-02-09 NOTE — Telephone Encounter (Signed)
Called patient back with Dr. Theodosia Blender recommendations. Per Dr. Radford Pax, would like her to see her PCP ASAP. Patient will call her PCP and get an appointment.

## 2018-02-09 NOTE — Telephone Encounter (Signed)
Patient's daughter reports the patient had episodes of dizziness over the weekend. She gave VS readings: Friday: 138/74 HR 61 Saturday: 114/57 HR 64 Sunday: 147/64 HR 65 Monday: 126/62 HR 69   Called patient to discuss symptoms. Reviewed BP and HR readings and confirmed they were accurate. She takes her vitals after she takes her medicine. She states on Friday evening, she had "some pretty bad dizziness." The episode lasted only a few seconds. She was standing at the kitchen counter and said she probably would have fallen if she wasn't holding on when the dizziness came on. She states the room did not feel like it was spinning. She did not feel like she was going to faint. It was just a foggy dizziness that made her feel like she could have fallen. Saturday, she felt fine.  Sunday night, she had another episode while standing again. It was similar to but not nearly as bad as the episode described on Friday.  Both times, the dizziness went away after several seconds. She went to lie down and felt better.  Confirmed with the patient she is taking medications as instructed.  Confirmed with her she is staying hydrated.  She understands she will be called with Dr. Theodosia Blender recommendations. She also understands to check her BP if possible if another episode occurs.

## 2018-02-09 NOTE — Telephone Encounter (Signed)
Would like her to see her PCP ASAP

## 2018-02-10 ENCOUNTER — Encounter: Payer: Self-pay | Admitting: Internal Medicine

## 2018-02-10 ENCOUNTER — Ambulatory Visit (INDEPENDENT_AMBULATORY_CARE_PROVIDER_SITE_OTHER): Payer: Medicare Other | Admitting: Internal Medicine

## 2018-02-10 VITALS — BP 120/68 | HR 62 | Temp 97.9°F | Ht 62.0 in | Wt 143.0 lb

## 2018-02-10 DIAGNOSIS — I739 Peripheral vascular disease, unspecified: Secondary | ICD-10-CM

## 2018-02-10 DIAGNOSIS — R6889 Other general symptoms and signs: Secondary | ICD-10-CM

## 2018-02-10 DIAGNOSIS — I6340 Cerebral infarction due to embolism of unspecified cerebral artery: Secondary | ICD-10-CM | POA: Diagnosis not present

## 2018-02-10 DIAGNOSIS — H3530 Unspecified macular degeneration: Secondary | ICD-10-CM

## 2018-02-10 DIAGNOSIS — I10 Essential (primary) hypertension: Secondary | ICD-10-CM | POA: Diagnosis not present

## 2018-02-10 DIAGNOSIS — H814 Vertigo of central origin: Secondary | ICD-10-CM

## 2018-02-10 DIAGNOSIS — H8149 Vertigo of central origin, unspecified ear: Secondary | ICD-10-CM | POA: Diagnosis not present

## 2018-02-10 DIAGNOSIS — R42 Dizziness and giddiness: Secondary | ICD-10-CM | POA: Diagnosis not present

## 2018-02-10 NOTE — Patient Instructions (Signed)
REDUCE AMLODIPINE TO 5 MG DAILY FOR BLOOD PRESSURE  FOLLOW UP WITH HYPERTENSION CLINIC AS SCHEDULED  CONTACT EYE PROVIDER REGARDING POSSIBLE EYE DROP SIDE EFFECT OF HEAD FEELING HEAVY AFTER USING PILOCAR  Continue other medications as ordered  Continue home blood pressure/pulse (heart rate) checks ar least 2 times day AND WHEN YOU HAVE DIZZINESS  Follow up as scheduled or sooner if need be. Will call with imaging results

## 2018-02-10 NOTE — Progress Notes (Signed)
[name], female   DOB: 1924/05/02, 84 y.o.   MRN: [id number]    [facility] OFFICE  Provider: DR Arletha Grippe  Code Status:  Goals of Care:  Advanced Directives 11/27/2017  Does Patient Have a Medical Advance Directive? No  Type of Advance Directive -  Does patient want to make changes to medical advance directive? -  Copy of [place] in Chart? -  Would patient like information on creating a medical advance directive? No - Patient declined     Chief Complaint  Patient presents with  . Acute Visit    Patient started new b/p medication (Dr.Turner's office) and told patient if she has symptoms of dizziness to follow-up. Patient states dizziness onsets when standing, dizziness will last for a little bit then fade off.     HPI: Patient is a 84 y.o. female seen today for an acute visit for dizziness. She saw cardio HTN clinic on 01/27/18. bystolic stopped 2/2 bradycardia (HR late 40s-early 50s) and her amlodipine was increased to 7.5mg  daily. She began to have dizziness unrelated to head position on 02/06/18. She also reports head feels "heavy" since stopping bystolic. No CP, SOB, palpitations, HA. No change in vision, ear problems or dysphagia. She checks BP at home.  She was dx with macular degeneration in Jan 2019 and began using eye gtts (pilocar) QID into OD in addition to simbrinza and an experimental eye gtt. She uses simbrinza in OU for glaucoma.  Past Medical History:  Diagnosis Date  . Anxiety   . Asthma   . Benign essential hypertension   . Bradycardia, drug induced 06/11/2016  . Edema extremities   . GERD (gastroesophageal reflux disease)   . Glaucoma   . Macular degeneration    Per patient   . Osteoarthritis   . PVD (peripheral vascular disease) ([place])   . Seasonal allergies   . Stroke [facility])     Past Surgical History:  Procedure Laterality Date  . ABDOMINAL HYSTERECTOMY  1981  . TONSILLECTOMY  1953     reports that she has never  smoked. She has never used smokeless tobacco. She reports that she does not drink alcohol or use drugs. Social History   Socioeconomic History  . Marital status: Widowed    Spouse name: Not on file  . Number of children: Not on file  . Years of education: Not on file  . Highest education level: Not on file  Occupational History  . Not on file  Social Needs  . Financial resource strain: Not on file  . Food insecurity:    Worry: Not on file    Inability: Not on file  . Transportation needs:    Medical: Not on file    Non-medical: Not on file  Tobacco Use  . Smoking status: Never Smoker  . Smokeless tobacco: Never Used  Substance and Sexual Activity  . Alcohol use: No    Alcohol/week: 0.0 oz  . Drug use: No  . Sexual activity: Not Currently  Lifestyle  . Physical activity:    Days per week: Not on file    Minutes per session: Not on file  . Stress: Not on file  Relationships  . Social connections:    Talks on phone: Not on file    Gets together: Not on file    Attends religious service: Not on file    Active member of club or organization: Not on file    Attends meetings of  clubs or organizations: Not on file    Relationship status: Not on file  . Intimate partner violence:    Fear of current or ex partner: Not on file    Emotionally abused: Not on file    Physically abused: Not on file    Forced sexual activity: Not on file  Other Topics Concern  . 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 Stroke Father   . Hypertension Father   . Heart disease Mother   . Hypertension Mother   . Diabetes Mother     Allergies  Allergen Reactions  . Aspirin Other (See Comments)    sweats  . Codeine Other (See Comments)  . Iodine Other (See Comments)    Pt is not aware of this allergy, does not recall much info.  . Prednisone Other (See  Comments)    sweats  . Lyrica [Pregabalin]     Wobbly, "more than sleepy"  . Benzonatate Itching and Rash  . Sulfa Antibiotics Other (See Comments)    Doesn't remember    Outpatient Encounter Medications as of 02/10/2018  Medication Sig  . acetaminophen (TYLENOL) 500 MG tablet Take 1 tablet (500 mg total) by mouth every 6 (six) hours as needed for moderate pain.  Marland Kitchen albuterol (PROVENTIL HFA;VENTOLIN HFA) 108 (90 Base) MCG/ACT inhaler Inhale 2 puffs into the lungs every 6 (six) hours as needed for wheezing.  Marland Kitchen alum hydroxide-mag trisilicate (GAVISCON) 85-46 MG CHEW chewable tablet Chew 1 tablet by mouth 3 (three) times daily as needed for flatulence (bowel spasms).  Marland Kitchen amLODipine (NORVASC) 5 MG tablet TAKE 1.5 TABLETS (7.5 MG TOTAL) BY MOUTH DAILY.  Marland Kitchen atorvastatin (LIPITOR) 10 MG tablet TAKE 1 TABLET BY MOUTH EVERY DAY  . B Complex Vitamins (B COMPLEX PO) Take 1 tablet by mouth daily.  . Brinzolamide-Brimonidine (SIMBRINZA) 1-0.2 % SUSP Apply 1 drop to eye 3 (three) times daily.  . Calcium Carbonate-Vitamin D (CALTRATE 600+D) 600-400 MG-UNIT per tablet Take 2 tablets by mouth daily.  . Cholecalciferol (VITAMIN D3) 2000 units TABS Take 2,000 Units by mouth daily.  . clopidogrel (PLAVIX) 75 MG tablet TAKE 1 TABLET (75 MG TOTAL) BY MOUTH DAILY.  Marland Kitchen Coenzyme Q10 (COQ10) 50 MG CAPS Take 50 mg by mouth daily.  Marland Kitchen docusate sodium (COLACE) 100 MG capsule Take 1 capsule (100 mg total) by mouth daily as needed for mild constipation (to soften stool).  . fluticasone (FLONASE) 50 MCG/ACT nasal spray Place 1 spray into the nose daily.  Marland Kitchen loteprednol (LOTEMAX) 0.5 % ophthalmic suspension Place 1 drop into both eyes daily as needed (for dry eyes).   . Multiple Vitamins-Minerals (SENIOR MULTIVITAMIN PLUS) TABS Take 1 tablet by mouth daily.   . NONFORMULARY OR COMPOUNDED ITEM [facility]:  Peripheral Neuropathy cream - Bupivacaine 1%, Doxepin 3%, Gabapentin 6%, Pentoxifylline 3%, Topiramate 1%, apply 1-2  gramsto affected are 3-4 times daily.  . NONFORMULARY OR COMPOUNDED ITEM APPLY 1 2 GMS TO AFFECTED AREA 3 4 TIMES DAILY AS NEEDED FOR PAIN  THIS IS A COMPOUNDED CREAM 1 PUMP EQUALS 1 GRAM  . nystatin cream (MYCOSTATIN) Apply 1 application topically 3 (three) times daily.  . Omega-3 Fatty Acids (FISH OIL) 1200 MG CPDR Take 1,200 mg by mouth daily. Take one tablet by mouth once daily   . pantoprazole (PROTONIX) 40 MG  tablet Take 1 tablet (40 mg total) by mouth daily.  . RESTASIS 0.05 % ophthalmic emulsion Place 1 drop into both eyes 2 (two) times daily.   . risperiDONE (RISPERDAL) 0.5 MG tablet TAKE 1 TABLET BY MOUTH TWICE A DAY  . SYMBICORT 80-4.5 MCG/ACT inhaler Place 2 puffs into alternate nostrils 2 (two) times daily.  . vitamin C (ASCORBIC ACID) 500 MG tablet Take 500 mg by mouth daily.  . pilocarpine (PILOCAR) 1 % ophthalmic solution 1 drop into right eye 4 times daily  . [DISCONTINUED] bimatoprost (LUMIGAN) 0.01 % SOLN Place 1 drop into both eyes at bedtime.   No facility-administered encounter medications on file as of 02/10/2018.     Review of Systems:  Review of Systems  Eyes: Positive for visual disturbance.  Neurological: Positive for dizziness. Negative for headaches.  All other systems reviewed and are negative.   Health Maintenance  Topic Date Due  . MAMMOGRAM  10/07/2018 (Originally 09/28/2009)  . TETANUS/TDAP  10/07/2018 (Originally 10/04/1943)  . INFLUENZA VACCINE  05/07/2018  . DEXA SCAN  Completed  . PNA vac Low Risk Adult  Completed    Physical Exam: Vitals:   02/10/18 1435  BP: 120/68  Pulse: 62  Temp: 97.9 F (36.6 C)  TempSrc: Oral  SpO2: 98%  Weight: 143 lb (64.9 kg)  Height: 5\' 2"  (1.575 m)   Body mass index is 26.16 kg/m. Physical Exam  Constitutional: She is oriented to person, place, and time. She appears well-developed and well-nourished.  HENT:  Mouth/Throat: Oropharynx is clear and moist. No oropharyngeal exudate.  TMs appear intact - min  cerumen in L>R external ear canal; MMM; no oral thrush; no sinus TTP  Eyes: Pupils are equal, round, and reactive to light. EOM are normal. Right eye exhibits discharge (crusting). Left eye exhibits discharge (crusting). No scleral icterus.  Neck: Neck supple. Muscular tenderness present. Carotid bruit is not present. Decreased range of motion present. No tracheal deviation present. No thyromegaly present.    Cardiovascular: Normal rate, regular rhythm and intact distal pulses. Exam reveals gallop. Exam reveals no friction rub.  Murmur (2/6 SEM) heard. No LE edema b/l. no calf TTP.   Pulmonary/Chest: Effort normal and breath sounds normal. No stridor. No respiratory distress. She has no wheezes. She has no rales.  Abdominal: Soft. Normal appearance and bowel sounds are normal. She exhibits no distension and no mass. There is no hepatomegaly. There is no tenderness. There is no rigidity, no rebound and no guarding. No hernia.  Musculoskeletal: She exhibits edema.  Lymphadenopathy:    She has no cervical adenopathy.  Neurological: She is alert and oriented to person, place, and time. She has normal reflexes. Gait (unsteady with cane) abnormal.  Neg Dix hallpike maneuver  Skin: Skin is warm and dry. No rash noted.  Psychiatric: She has a normal mood and affect. Her behavior is normal. Judgment and thought content normal.    Labs reviewed: Basic Metabolic Panel: Recent Labs    03/24/17 1200 09/03/17 1117 10/28/17 1210  NA 143 145* 141  K 3.7 3.9 3.9  CL 112* 108* 109  CO2 23 24 25   GLUCOSE 97 113* 97  BUN 18 14 16   CREATININE 0.81 0.91 0.85  CALCIUM 9.1 9.4 9.6  MG  --   --  2.1   Liver Function Tests: Recent Labs    03/24/17 1200 09/03/17 1117  AST 18 21  ALT 16 17  ALKPHOS 75 83  BILITOT 0.9 0.8  PROT 6.5  6.8  ALBUMIN 4.0 4.2   No results for input(s): LIPASE, AMYLASE in the last 8760 hours. No results for input(s): AMMONIA in the last 8760 hours. CBC: Recent Labs     03/24/17 1200  WBC 5.6  NEUTROABS 3,136  HGB 11.8  HCT 36.1  MCV 95.8  PLT 179   Lipid Panel: Recent Labs    03/24/17 1200  CHOL 159  HDL 109  LDLCALC 41  TRIG 46  CHOLHDL 1.5   Lab Results  Component Value Date   HGBA1C 5.6 04/22/2016    Procedures since last visit: No results found.  Assessment/Plan   ICD-10-CM   1. Dizziness R42    probably medication induced  2. Sensation of heaviness R68.89    in head  3. PVD (peripheral vascular disease) (HCC) I73.9   4. Macular degeneration of left eye, unspecified type H35.30   5. Benign essential hypertension I10   6. Vertigo of central origin, unspecified laterality H81.49 CT Head Wo Contrast   REDUCE AMLODIPINE TO 5 MG DAILY FOR BLOOD PRESSURE  FOLLOW UP WITH HYPERTENSION CLINIC AS SCHEDULED  CONTACT EYE PROVIDER REGARDING POSSIBLE EYE DROP SIDE EFFECT OF HEAD FEELING HEAVY AFTER USING PILOCAR  Continue other medications as ordered  Continue home blood pressure/pulse (heart rate) checks ar least 2 times day AND WHEN YOU HAVE DIZZINESS  Follow up as scheduled or sooner if need be. Will call with imaging results  Cinnamon Morency S. Perlie Gold  [facility] [address] [place], [place] [address] [phone] Cell (Monday-Friday 8 AM - 5 PM) [phone] After 5 PM and follow prompts

## 2018-02-12 DIAGNOSIS — H3530 Unspecified macular degeneration: Secondary | ICD-10-CM | POA: Insufficient documentation

## 2018-02-16 DIAGNOSIS — H401113 Primary open-angle glaucoma, right eye, severe stage: Secondary | ICD-10-CM | POA: Diagnosis not present

## 2018-02-17 ENCOUNTER — Ambulatory Visit (INDEPENDENT_AMBULATORY_CARE_PROVIDER_SITE_OTHER): Payer: Medicare Other | Admitting: Pharmacist

## 2018-02-17 VITALS — BP 152/68 | HR 56

## 2018-02-17 DIAGNOSIS — I10 Essential (primary) hypertension: Secondary | ICD-10-CM | POA: Diagnosis not present

## 2018-02-17 DIAGNOSIS — I6340 Cerebral infarction due to embolism of unspecified cerebral artery: Secondary | ICD-10-CM

## 2018-02-17 MED ORDER — AMLODIPINE BESYLATE 5 MG PO TABS: 5.0000 mg | ORAL_TABLET | Freq: Every day | ORAL | 3 refills | Status: DC

## 2018-02-17 NOTE — Patient Instructions (Addendum)
It was nice to see you today  Continue to take your amlodipine 5mg  daily for your blood pressure  Your blood pressure goal is less than 150/90  Try to snack throughout the day instead of skipping meals - see if this helps to improve your dizziness  Write down your blood pressure readings when you feel dizzy  Call clinic if you notice your dizziness continues Jinny Blossom, Pharmacist [phone]

## 2018-02-17 NOTE — Progress Notes (Signed)
[name]                 DOB: [birth year]                      MRN: [id number]     HPI: [name] [name] is a 84 y.o. female referred by Dr. Radford Pax to HTN clinic. She was previously seen in HTN clinic in 2017. PMH is significant for HTN, PVD, stroke, bradycardia, chronic LE edema, GERD, and asthma. At her last visit, HR remained low ranging between 81-82 and Bystolic was discontinued.   Patient presents today with her daughter in good spirits. She saw her PCP on 5/7 after reports of dizziness that did not seem to correlate with lower BP readings. Her amlodipine was decreased from 7.5mg  to 5mg  daily and f/u CT scan was ordered. Pt still reports some dizziness at home a few days a week. She cannot attribute it to any particular time of day or with low/high BP readings. BP has been running a bit higher since cutting back on amlodipine dose - systolic 993-716 up from 967-893. She does not associate dizziness with walking, but occasionally after she cycles a bit longer than normal. At the end of the visit, pt does mention that sometimes she skips lunch if she is not hungry, and occasionally feels dizzy after this. Dizzy episodes may be related more to potential for low blood sugar rather than BP. Her HR has improved to 55-65 since discontinuing Bystolic.  Current HTN meds: amlodipine 5mg  daily Previously tried: lisinopril - dizziness, Bystolic - bradycardia BP goal: <150/99mmHg due to age  Family History: The patient's family history includes Diabetes in her mother; Heart disease in her mother; Hypertension in her father and mother; Stroke in her father.  Social History: Denies alcohol, tobacco, and illicit drug use.  Diet: Eats at home mostly. Likes chicken and greens. Does not add salt to food. Drinks water. Drinks 1/2 cup of coffee occasionally.  Exercise: Ambulates well with a cane  Wt Readings from Last 3 Encounters:  02/10/18 143 lb (64.9 kg)  12/30/17 142 lb (64.4 kg)    12/09/17 144 lb 12.8 oz (65.7 kg)   BP Readings from Last 3 Encounters:  02/10/18 120/68  01/27/18 124/70  12/30/17 (!) 160/90   Pulse Readings from Last 3 Encounters:  02/10/18 62  01/27/18 (!) 49  12/30/17 (!) 55    Renal function: CrCl cannot be calculated (Patient's most recent lab result is older than the maximum 21 days allowed.).  Past Medical History:  Diagnosis Date  . Anxiety   . Asthma   . Benign essential hypertension   . Bradycardia, drug induced 06/11/2016  . Edema extremities   . GERD (gastroesophageal reflux disease)   . Glaucoma   . Macular degeneration    Per patient   . Osteoarthritis   . PVD (peripheral vascular disease) ([place])   . Seasonal allergies   . Stroke [facility])     Current Outpatient Medications on File Prior to Visit  Medication Sig Dispense Refill  . acetaminophen (TYLENOL) 500 MG tablet Take 1 tablet (500 mg total) by mouth every 6 (six) hours as needed for moderate pain. 30 tablet 0  . albuterol (PROVENTIL HFA;VENTOLIN HFA) 108 (90 Base) MCG/ACT inhaler Inhale 2 puffs into the lungs every 6 (six) hours as needed for wheezing. 1 Inhaler 5  . alum hydroxide-mag trisilicate (GAVISCON) 81-01 MG CHEW chewable tablet Chew 1 tablet by mouth  3 (three) times daily as needed for flatulence (bowel spasms). 1 each 0  . amLODipine (NORVASC) 5 MG tablet TAKE 1.5 TABLETS (7.5 MG TOTAL) BY MOUTH DAILY. 45 tablet 11  . atorvastatin (LIPITOR) 10 MG tablet TAKE 1 TABLET BY MOUTH EVERY DAY 90 tablet 1  . B Complex Vitamins (B COMPLEX PO) Take 1 tablet by mouth daily.    . Brinzolamide-Brimonidine (SIMBRINZA) 1-0.2 % SUSP Apply 1 drop to eye 3 (three) times daily.    . Calcium Carbonate-Vitamin D (CALTRATE 600+D) 600-400 MG-UNIT per tablet Take 2 tablets by mouth daily.    . Cholecalciferol (VITAMIN D3) 2000 units TABS Take 2,000 Units by mouth daily.    . clopidogrel (PLAVIX) 75 MG tablet TAKE 1 TABLET (75 MG TOTAL) BY MOUTH DAILY. 90 tablet 1  . Coenzyme Q10  (COQ10) 50 MG CAPS Take 50 mg by mouth daily.    Marland Kitchen docusate sodium (COLACE) 100 MG capsule Take 1 capsule (100 mg total) by mouth daily as needed for mild constipation (to soften stool). 30 capsule 1  . fluticasone (FLONASE) 50 MCG/ACT nasal spray Place 1 spray into the nose daily.    Marland Kitchen loteprednol (LOTEMAX) 0.5 % ophthalmic suspension Place 1 drop into both eyes daily as needed (for dry eyes).     . Multiple Vitamins-Minerals (SENIOR MULTIVITAMIN PLUS) TABS Take 1 tablet by mouth daily.     . NONFORMULARY OR COMPOUNDED ITEM [facility]:  Peripheral Neuropathy cream - Bupivacaine 1%, Doxepin 3%, Gabapentin 6%, Pentoxifylline 3%, Topiramate 1%, apply 1-2 gramsto affected are 3-4 times daily. 120 each 5  . NONFORMULARY OR COMPOUNDED ITEM APPLY 1 2 GMS TO AFFECTED AREA 3 4 TIMES DAILY AS NEEDED FOR PAIN  THIS IS A COMPOUNDED CREAM 1 PUMP EQUALS 1 GRAM  2  . nystatin cream (MYCOSTATIN) Apply 1 application topically 3 (three) times daily.    . Omega-3 Fatty Acids (FISH OIL) 1200 MG CPDR Take 1,200 mg by mouth daily. Take one tablet by mouth once daily     . pantoprazole (PROTONIX) 40 MG tablet Take 1 tablet (40 mg total) by mouth daily. 30 tablet 5  . pilocarpine (PILOCAR) 1 % ophthalmic solution 1 drop into right eye 4 times daily  99  . RESTASIS 0.05 % ophthalmic emulsion Place 1 drop into both eyes 2 (two) times daily.     . risperiDONE (RISPERDAL) 0.5 MG tablet TAKE 1 TABLET BY MOUTH TWICE A DAY 120 tablet 1  . SYMBICORT 80-4.5 MCG/ACT inhaler Place 2 puffs into alternate nostrils 2 (two) times daily.    . vitamin C (ASCORBIC ACID) 500 MG tablet Take 500 mg by mouth daily.     No current facility-administered medications on file prior to visit.     Allergies  Allergen Reactions  . Aspirin Other (See Comments)    sweats  . Codeine Other (See Comments)  . Iodine Other (See Comments)    Pt is not aware of this allergy, does not recall much info.  . Prednisone Other (See Comments)     sweats  . Lyrica [Pregabalin]     Wobbly, "more than sleepy"  . Benzonatate Itching and Rash  . Sulfa Antibiotics Other (See Comments)    Doesn't remember     Assessment/Plan:  1. Hypertension - BP readings have increased since PCP decreased amlodipine from 7.5mg  to 5mg  due to occasional episodes of dizziness. BP ok in clinic today close to goal <150/56mmHg. Pt does not seem to associate dizzy spells with  particularly low BP readings. At the end of her visit, pt mentions feeling dizzy sometimes after she has skipped lunch. Discussed that occasional dizziness may be due to low blood sugar after skipping meals rather than being related to her BP. Advised pt to sip on some juice or eat a small snack if she does not feel hungry enough for a full meal at lunch. Will continue amlodipine 5mg  daily and advised pt to monitor BP at home with pharmacy f/u as needed.   Alissia Lory E. Magon Croson, PharmD, CPP, [place] [address] [place], [place] [address] Phone: [phone]; Fax: [phone] 02/17/2018 7:12 AM

## 2018-03-04 ENCOUNTER — Telehealth: Payer: Self-pay

## 2018-03-04 NOTE — Telephone Encounter (Signed)
Message left on clinical intake voicemail:  Patient's daughter was calling to inform Dr.Reed patient with weakness in knees and is unable to stand. Patient's daughter would like for me to follow-up by calling her mother for additional information. [name] [name] stated that if patient needs an appointment can we schedule for Monday or Tuesday for she will be in town and able to accompany her mother.   Spoke with patient, patient states she has weakness in knees x 2 days with no pain or swelling. Patient denies injury to knees. Patient requested that I call her daughter to schedule appointment    Spoke with [name] [name], scheduled appointment for Tuesday with Dr.Carter. [name] [name] refused to schedule appointment with NP sooner. I informed [name] [name] if patient's symptoms progress she needs to seek medical attention, [name] [name] verbalized understanding.

## 2018-03-09 ENCOUNTER — Ambulatory Visit
Admission: RE | Admit: 2018-03-09 | Discharge: 2018-03-09 | Disposition: A | Discharge status: Home / Self Care | Payer: Medicare Other | Source: Ambulatory Visit | Attending: Internal Medicine | Admitting: Internal Medicine

## 2018-03-09 DIAGNOSIS — H814 Vertigo of central origin: Secondary | ICD-10-CM

## 2018-03-09 DIAGNOSIS — R42 Dizziness and giddiness: Secondary | ICD-10-CM | POA: Diagnosis not present

## 2018-03-10 ENCOUNTER — Encounter: Payer: Self-pay | Admitting: Internal Medicine

## 2018-03-10 ENCOUNTER — Ambulatory Visit (INDEPENDENT_AMBULATORY_CARE_PROVIDER_SITE_OTHER): Payer: Medicare Other | Admitting: Internal Medicine

## 2018-03-10 VITALS — BP 118/70 | HR 57 | Temp 98.0°F | Resp 10 | Ht 62.0 in | Wt 142.8 lb

## 2018-03-10 DIAGNOSIS — I6340 Cerebral infarction due to embolism of unspecified cerebral artery: Secondary | ICD-10-CM

## 2018-03-10 DIAGNOSIS — M25562 Pain in left knee: Secondary | ICD-10-CM | POA: Diagnosis not present

## 2018-03-10 DIAGNOSIS — R42 Dizziness and giddiness: Secondary | ICD-10-CM

## 2018-03-10 DIAGNOSIS — G8929 Other chronic pain: Secondary | ICD-10-CM | POA: Diagnosis not present

## 2018-03-10 DIAGNOSIS — J302 Other seasonal allergic rhinitis: Secondary | ICD-10-CM | POA: Diagnosis not present

## 2018-03-10 MED ORDER — LORATADINE 5 MG PO CHEW: 5.0000 mg | CHEWABLE_TABLET | Freq: Every day | ORAL | 6 refills | Status: DC

## 2018-03-10 NOTE — Progress Notes (Signed)
[name], female   DOB: Jan 08, 1924, 84 y.o.   MRN: [id number]   [facility] OFFICE  Provider: DR Arletha Grippe  Code Status: Goals of Care:  Advanced Directives 11/27/2017  Does Patient Have a Medical Advance Directive? No  Type of Advance Directive -  Does patient want to make changes to medical advance directive? -  Copy of [place] in Chart? -  Would patient like information on creating a medical advance directive? No - Patient declined     Chief Complaint  Patient presents with  . Acute Visit    Patient c/o bilateral knee weakness, patient denies injury   . Medication Refill    No refills needed     HPI: Patient is a 84 y.o. female seen today for an acute visit for L>R knee pain and swelling. She reports weakness in legs due to pain and difficulty ambulating. She has never had xrays of knees. She did have ABIs in Feb 2019 that revealed PAD on left. She is applying prn topical pain cream she got from podiatry to her knee which helps.  Dizziness unchanged. She has been staying in the bed. She recalls similar sx's about 10 yrs ago with vertigo. Discussed CT head results. She takes plavix. She also takes flonase.   She is a poor historian due to memory loss. Hx obtained from chart.  Past Medical History:  Diagnosis Date  . Anxiety   . Asthma   . Benign essential hypertension   . Bradycardia, drug induced 06/11/2016  . Edema extremities   . GERD (gastroesophageal reflux disease)   . Glaucoma   . Macular degeneration    Per patient   . Osteoarthritis   . PVD (peripheral vascular disease) ([place])   . Seasonal allergies   . Stroke [facility])     Past Surgical History:  Procedure Laterality Date  . ABDOMINAL HYSTERECTOMY  1981  . TONSILLECTOMY  1953     reports that she has never smoked. She has never used smokeless tobacco. She reports that she does not drink alcohol or use drugs. Social History   Socioeconomic History  . Marital status:  Widowed    Spouse name: Not on file  . Number of children: Not on file  . Years of education: Not on file  . Highest education level: Not on file  Occupational History  . Not on file  Social Needs  . Financial resource strain: Not on file  . Food insecurity:    Worry: Not on file    Inability: Not on file  . Transportation needs:    Medical: Not on file    Non-medical: Not on file  Tobacco Use  . Smoking status: Never Smoker  . Smokeless tobacco: Never Used  Substance and Sexual Activity  . Alcohol use: No    Alcohol/week: 0.0 oz  . Drug use: No  . Sexual activity: Not Currently  Lifestyle  . Physical activity:    Days per week: Not on file    Minutes per session: Not on file  . Stress: Not on file  Relationships  . Social connections:    Talks on phone: Not on file    Gets together: Not on file    Attends religious service: Not on file    Active member of club or organization: Not on file    Attends meetings of clubs or organizations: Not on file    Relationship status: Not on file  . Intimate  partner violence:    Fear of current or ex partner: Not on file    Emotionally abused: Not on file    Physically abused: Not on file    Forced sexual activity: Not on file  Other Topics Concern  . 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 Stroke Father   . Hypertension Father   . Heart disease Mother   . Hypertension Mother   . Diabetes Mother     Allergies  Allergen Reactions  . Aspirin Other (See Comments)    sweats  . Codeine Other (See Comments)  . Iodine Other (See Comments)    Pt is not aware of this allergy, does not recall much info.  . Prednisone Other (See Comments)    sweats  . Lyrica [Pregabalin]     Wobbly, "more than sleepy"  . Benzonatate Itching and Rash  . Sulfa Antibiotics Other (See Comments)    Doesn't  remember    Outpatient Encounter Medications as of 03/10/2018  Medication Sig  . acetaminophen (TYLENOL) 500 MG tablet Take 1 tablet (500 mg total) by mouth every 6 (six) hours as needed for moderate pain.  Marland Kitchen albuterol (PROVENTIL HFA;VENTOLIN HFA) 108 (90 Base) MCG/ACT inhaler Inhale 2 puffs into the lungs every 6 (six) hours as needed for wheezing.  Marland Kitchen alum hydroxide-mag trisilicate (GAVISCON) 62-70 MG CHEW chewable tablet Chew 1 tablet by mouth 3 (three) times daily as needed for flatulence (bowel spasms).  Marland Kitchen amLODipine (NORVASC) 5 MG tablet Take 1 tablet (5 mg total) by mouth daily.  Marland Kitchen atorvastatin (LIPITOR) 10 MG tablet TAKE 1 TABLET BY MOUTH EVERY DAY  . B Complex Vitamins (B COMPLEX PO) Take 1 tablet by mouth daily.  . Brinzolamide-Brimonidine (SIMBRINZA) 1-0.2 % SUSP Apply 1 drop to eye 3 (three) times daily.  . Calcium Carbonate-Vitamin D (CALTRATE 600+D) 600-400 MG-UNIT per tablet Take 2 tablets by mouth daily.  . Cholecalciferol (VITAMIN D3) 2000 units TABS Take 2,000 Units by mouth daily.  . clopidogrel (PLAVIX) 75 MG tablet TAKE 1 TABLET (75 MG TOTAL) BY MOUTH DAILY.  Marland Kitchen Coenzyme Q10 (COQ10) 50 MG CAPS Take 50 mg by mouth daily.  Marland Kitchen docusate sodium (COLACE) 100 MG capsule Take 1 capsule (100 mg total) by mouth daily as needed for mild constipation (to soften stool).  . fluticasone (FLONASE) 50 MCG/ACT nasal spray Place 1 spray into the nose daily.  Marland Kitchen loteprednol (LOTEMAX) 0.5 % ophthalmic suspension Place 1 drop into both eyes daily as needed (for dry eyes).   . Multiple Vitamins-Minerals (SENIOR MULTIVITAMIN PLUS) TABS Take 1 tablet by mouth daily.   . NONFORMULARY OR COMPOUNDED ITEM [facility]:  Peripheral Neuropathy cream - Bupivacaine 1%, Doxepin 3%, Gabapentin 6%, Pentoxifylline 3%, Topiramate 1%, apply 1-2 gramsto affected are 3-4 times daily.  . NONFORMULARY OR COMPOUNDED ITEM APPLY 1 2 GMS TO AFFECTED AREA 3 4 TIMES DAILY AS NEEDED FOR PAIN  THIS IS A COMPOUNDED CREAM 1  PUMP EQUALS 1 GRAM  . Omega-3 Fatty Acids (FISH OIL) 1200 MG CPDR Take 1,200 mg by mouth daily. Take one tablet by mouth once daily   . pantoprazole (PROTONIX) 40 MG tablet Take 1 tablet (40 mg total) by mouth daily.  . pilocarpine (PILOCAR) 1 % ophthalmic solution 1 drop into right eye 4 times daily  . RESTASIS 0.05 %  ophthalmic emulsion Place 1 drop into both eyes 2 (two) times daily.   . risperiDONE (RISPERDAL) 0.5 MG tablet TAKE 1 TABLET BY MOUTH TWICE A DAY  . SYMBICORT 80-4.5 MCG/ACT inhaler Place 2 puffs into alternate nostrils 2 (two) times daily.  . vitamin C (ASCORBIC ACID) 500 MG tablet Take 500 mg by mouth daily.  . [DISCONTINUED] nystatin cream (MYCOSTATIN) Apply 1 application topically 3 (three) times daily.   No facility-administered encounter medications on file as of 03/10/2018.     Review of Systems:  Review of Systems  Musculoskeletal: Positive for gait problem and joint swelling.  Neurological: Positive for dizziness and weakness.  All other systems reviewed and are negative.   Health Maintenance  Topic Date Due  . MAMMOGRAM  10/07/2018 (Originally 09/28/2009)  . TETANUS/TDAP  10/07/2018 (Originally [birth year]/04/1945)  . INFLUENZA VACCINE  05/07/2018  . DEXA SCAN  Completed  . PNA vac Low Risk Adult  Completed    Physical Exam: Vitals:   03/10/18 0830  BP: 118/70  Pulse: (!) 57  Resp: 10  Temp: 98 F (36.7 C)  TempSrc: Oral  SpO2: 98%  Weight: 142 lb 12.8 oz (64.8 kg)  Height: 5\' 2"  (1.575 m)   Body mass index is 26.12 kg/m. Physical Exam  Constitutional: She appears well-developed and well-nourished.  Cardiovascular:  +1 pitting LE edema b/l. No calf TTP  Musculoskeletal: She exhibits edema (small and large joints;).  L>R knee swelling with reduced ROM; left neg drawer test; left neg McMurray test; crepitus on left knee ROM  Neurological: She is alert.  Skin: Skin is warm and dry. No rash noted.  Psychiatric: She has a normal mood and affect. Her  behavior is normal. Thought content normal.    Labs reviewed: Basic Metabolic Panel: Recent Labs    03/24/17 1200 09/03/17 1117 10/28/17 1210  NA 143 145* 141  K 3.7 3.9 3.9  CL 112* 108* 109  CO2 23 24 25   GLUCOSE 97 113* 97  BUN 18 14 16   CREATININE 0.81 0.91 0.85  CALCIUM 9.1 9.4 9.6  MG  --   --  2.1   Liver Function Tests: Recent Labs    03/24/17 1200 09/03/17 1117  AST 18 21  ALT 16 17  ALKPHOS 75 83  BILITOT 0.9 0.8  PROT 6.5 6.8  ALBUMIN 4.0 4.2   No results for input(s): LIPASE, AMYLASE in the last 8760 hours. No results for input(s): AMMONIA in the last 8760 hours. CBC: Recent Labs    03/24/17 1200  WBC 5.6  NEUTROABS 3,136  HGB 11.8  HCT 36.1  MCV 95.8  PLT 179   Lipid Panel: Recent Labs    03/24/17 1200  CHOL 159  HDL 109  LDLCALC 41  TRIG 46  CHOLHDL 1.5   Lab Results  Component Value Date   HGBA1C 5.6 04/22/2016    Procedures since last visit: Ct Head Wo Contrast  Result Date: 03/09/2018 CLINICAL DATA:  Vertigo.  Central visual loss EXAM: CT HEAD WITHOUT CONTRAST TECHNIQUE: Contiguous axial images were obtained from the base of the skull through the vertex without intravenous contrast. COMPARISON:  09/19/2016 FINDINGS: Brain: There is atrophy and chronic small vessel disease changes. No acute intracranial abnormality. Specifically, no hemorrhage, hydrocephalus, mass lesion, acute infarction, or significant intracranial injury. Vascular: No hyperdense vessel or unexpected calcification. Skull: No acute calvarial abnormality. Sinuses/Orbits: Visualized paranasal sinuses and mastoids clear. Orbital soft tissues unremarkable. Other: None IMPRESSION: No acute intracranial abnormality. Atrophy, chronic microvascular  disease. Electronically Signed   By: Rolm Baptise M.D.   On: 03/09/2018 13:31    Assessment/Plan   ICD-10-CM   1. Chronic pain of left knee M25.562 DG Knee Complete 4 Views Left   G89.29 Ambulatory referral to Orthopedic Surgery    2. Dizziness R42 loratadine (CLARITIN) 5 MG chewable tablet  3. Seasonal allergic rhinitis, unspecified trigger J30.2 loratadine (CLARITIN) 5 MG chewable tablet   Use flonase daily as directed  START CHEWABLE 5MG  CLARITIN/LORATIDINE daily for seasonal allergy  Continue other medications as ordered  May use pain cream on your knees as needed until Ortho appt  Will call with Orthopedic referral  May consider vestibular rehab if no better  Follow up with Dr Mariea Clonts in July for routine visit/medical mx   Oronde Hallenbeck S. Perlie Gold  [facility] [address] [place], [place] [address] [phone] Cell (Monday-Friday 8 AM - 5 PM) [phone] After 5 PM and follow prompts

## 2018-03-10 NOTE — Patient Instructions (Addendum)
Use flonase daily as directed  START CHEWABLE 5MG  CLARITIN/LORATIDINE daily for seasonal allergy  Continue other medications as ordered  May use pain cream on your knees as needed until Ortho appt  Will call with Orthopedic referral  May consider vestibular rehab if no better  Follow up with Dr Mariea Clonts in July for routine visit/medical mx

## 2018-03-11 ENCOUNTER — Ambulatory Visit: Payer: Self-pay | Admitting: Internal Medicine

## 2018-03-11 ENCOUNTER — Ambulatory Visit
Admission: RE | Admit: 2018-03-11 | Discharge: 2018-03-11 | Disposition: A | Discharge status: Home / Self Care | Payer: Medicare Other | Source: Ambulatory Visit | Attending: Internal Medicine | Admitting: Internal Medicine

## 2018-03-11 DIAGNOSIS — G8929 Other chronic pain: Secondary | ICD-10-CM

## 2018-03-11 DIAGNOSIS — M25562 Pain in left knee: Secondary | ICD-10-CM | POA: Diagnosis not present

## 2018-03-27 ENCOUNTER — Ambulatory Visit (INDEPENDENT_AMBULATORY_CARE_PROVIDER_SITE_OTHER): Payer: Medicare Other

## 2018-03-27 ENCOUNTER — Encounter (INDEPENDENT_AMBULATORY_CARE_PROVIDER_SITE_OTHER): Payer: Self-pay | Admitting: Orthopedic Surgery

## 2018-03-27 ENCOUNTER — Ambulatory Visit (INDEPENDENT_AMBULATORY_CARE_PROVIDER_SITE_OTHER): Payer: Medicare Other | Admitting: Orthopedic Surgery

## 2018-03-27 DIAGNOSIS — I6340 Cerebral infarction due to embolism of unspecified cerebral artery: Secondary | ICD-10-CM | POA: Diagnosis not present

## 2018-03-27 DIAGNOSIS — G8929 Other chronic pain: Secondary | ICD-10-CM

## 2018-03-27 DIAGNOSIS — M25561 Pain in right knee: Secondary | ICD-10-CM | POA: Diagnosis not present

## 2018-03-28 ENCOUNTER — Encounter (INDEPENDENT_AMBULATORY_CARE_PROVIDER_SITE_OTHER): Payer: Self-pay | Admitting: Orthopedic Surgery

## 2018-03-28 NOTE — Progress Notes (Signed)
Office Visit Note   Patient: [name] [name]           Date of Birth: Dec 27, 1923           MRN: [id number] Visit Date: 03/27/2018 Requested by: Gildardo Cranker, DO [place], [place] [id number] PCP: Gayland Curry, DO  Subjective: Chief Complaint  Patient presents with  . Right Knee - Pain  . Left Knee - Pain    HPI: Patient presents with left greater than right knee pain.  She is had previous left knee x-rays which were unremarkable.  Reports some component of right knee pain as well.  She ambulates with a cane..  She has been doing a lot more walking for exercise a lot more riding of some type of bicycle for exercise.  States that the pain is been going on for a month but that it is better than it was now.  Does not have any radiating pain and denies any back pain or groin pain.  Not take any medication for the problem.              ROS: All systems reviewed are negative as they relate to the chief complaint within the history of present illness.  Patient denies  fevers or chills.   Assessment & Plan: Visit Diagnoses:  1. Chronic pain of right knee     Plan: Impression is mild knee pain no effusion in either knee give 84 year old patient who does not really appear chronologically 84 years old.  We discussed injection today but she is not really having enough symptoms and does not want to pursue that.  Medication which she may try in terms of taking Tylenol occasionally.  Her radiographs look remarkably good we also talked about modifying her exercise regimen and routine.  Persist injection of cortisone into the knee is a great way to press the reset button currently both knees look good on exam and radiographs.  Follow-Up Instructions: Return if symptoms worsen or fail to improve.   Orders:  Orders Placed This Encounter  Procedures  . XR KNEE 3 VIEW RIGHT   No orders of the defined types were placed in this encounter.     Procedures: No procedures  performed   Clinical Data: No additional findings.  Objective: Vital Signs: There were no vitals taken for this visit.  Physical Exam:   Constitutional: Patient appears well-developed HEENT:  Head: Normocephalic Eyes:EOM are normal Neck: Normal range of motion Cardiovascular: Normal rate Pulmonary/chest: Effort normal Neurologic: Patient is alert Skin: Skin is warm Psychiatric: Patient has normal mood and affect    Ortho Exam: Ortho exam demonstrates slightly antalgic gait to the left but no knee effusion in either knee.  Range of motion full bilaterally.  No focal joint line tenderness in the right or left knee.  No significant patellofemoral crepitus with range of motion.  Specialty Comments:  No specialty comments available.  Imaging: Xr Knee 3 View Right  Result Date: 03/28/2018 Spurring.  Joint space narrowing in the medial lateral or patellofemoral compartment.  Slight bone demineralization is present.  Normal right knee    PMFS History: Patient Active Problem List   Diagnosis Date Noted  . Macular degeneration of left eye 02/12/2018  . Episode of visual loss of left eye 09/03/2017  . Glaucoma of both eyes 09/03/2017  . Bradycardia, drug induced 06/11/2016  . Hyperlipidemia 02/06/2016  . Headache 12/14/2015  . Cerebral infarction due to embolism of cerebral artery ([place])   .  Acute CVA (cerebrovascular accident) ([place]) 12/09/2015  . Dizziness 12/09/2015  . CVA (cerebral infarction) 12/09/2015  . Asthma, mild intermittent 04/04/2014  . Gastroesophageal reflux disease without esophagitis 04/04/2014  . Neuropathy of both feet 04/04/2014  . Diuretic-induced hypokalemia 01/03/2014  . Olfactory hallucinations 11/23/2013  . SOB (shortness of breath) 10/26/2013  . Edema extremities 10/26/2013  . Paranoid type delusional disorder ([place]) 08/20/2013  . Essential hypertension   . Osteoarthritis   . Seasonal allergies   . Glaucoma   . PVD (peripheral vascular  disease) ([place])   . Anxiety    Past Medical History:  Diagnosis Date  . Anxiety   . Asthma   . Benign essential hypertension   . Bradycardia, drug induced 06/11/2016  . Edema extremities   . GERD (gastroesophageal reflux disease)   . Glaucoma   . Macular degeneration    Per patient   . Osteoarthritis   . PVD (peripheral vascular disease) ([place])   . Seasonal allergies   . Stroke [facility])     Family History  Problem Relation Age of Onset  . Stroke Father   . Hypertension Father   . Heart disease Mother   . Hypertension Mother   . Diabetes Mother     Past Surgical History:  Procedure Laterality Date  . ABDOMINAL HYSTERECTOMY  1981  . TONSILLECTOMY  1953   Social History   Occupational History  . Not on file  Tobacco Use  . Smoking status: Never Smoker  . Smokeless tobacco: Never Used  Substance and Sexual Activity  . Alcohol use: No    Alcohol/week: 0.0 oz  . Drug use: No  . Sexual activity: Not Currently

## 2018-04-06 DIAGNOSIS — H02403 Unspecified ptosis of bilateral eyelids: Secondary | ICD-10-CM | POA: Diagnosis not present

## 2018-04-06 DIAGNOSIS — H401134 Primary open-angle glaucoma, bilateral, indeterminate stage: Secondary | ICD-10-CM | POA: Diagnosis not present

## 2018-04-06 DIAGNOSIS — Z961 Presence of intraocular lens: Secondary | ICD-10-CM | POA: Diagnosis not present

## 2018-04-13 ENCOUNTER — Ambulatory Visit: Payer: Medicare Other | Admitting: Internal Medicine

## 2018-04-14 ENCOUNTER — Ambulatory Visit: Payer: Medicare Other | Admitting: Podiatry

## 2018-04-15 ENCOUNTER — Ambulatory Visit: Payer: Medicare Other

## 2018-04-27 ENCOUNTER — Ambulatory Visit: Payer: Medicare Other

## 2018-04-30 DIAGNOSIS — H401134 Primary open-angle glaucoma, bilateral, indeterminate stage: Secondary | ICD-10-CM | POA: Diagnosis not present

## 2018-05-01 ENCOUNTER — Ambulatory Visit (INDEPENDENT_AMBULATORY_CARE_PROVIDER_SITE_OTHER): Payer: Medicare Other | Admitting: Podiatry

## 2018-05-01 DIAGNOSIS — Z7901 Long term (current) use of anticoagulants: Secondary | ICD-10-CM

## 2018-05-01 DIAGNOSIS — Q828 Other specified congenital malformations of skin: Secondary | ICD-10-CM | POA: Diagnosis not present

## 2018-05-01 DIAGNOSIS — B351 Tinea unguium: Secondary | ICD-10-CM | POA: Diagnosis not present

## 2018-05-01 DIAGNOSIS — M79676 Pain in unspecified toe(s): Secondary | ICD-10-CM | POA: Diagnosis not present

## 2018-05-05 ENCOUNTER — Telehealth: Payer: Self-pay | Admitting: Podiatry

## 2018-05-05 MED ORDER — NONFORMULARY OR COMPOUNDED ITEM: 5 refills | Status: DC

## 2018-05-05 NOTE — Addendum Note (Signed)
Addended by: Harriett Sine D on: 05/05/2018 02:00 PM   Modules accepted: Orders

## 2018-05-05 NOTE — Telephone Encounter (Signed)
I informed pt's dtr, Arley Phenix, Dr. Jacqualyn Posey had refilled the Peripheral Neuropathy cream. Orders faxed to [place]

## 2018-05-05 NOTE — Progress Notes (Signed)
Subjective: 84 y.o. returns the office today for painful, elongated, thickened toenails which she cannot trim herslef. Denies any redness or drainage around the nails.  She does have a callus left fourth toe.  Denies any acute changes since last appointment and no new complaints today. Denies any systemic complaints such as fevers, chills, nausea, vomiting.   PCP: Gayland Curry, DO  She is on Plavix  Objective: AAO 3, NAD DP/PT pulses palpable, CRT less than 3 seconds Nails hypertrophic, dystrophic, elongated, brittle, discolored 10. There is tenderness overlying the nails 1-5 bilaterally. There is no surrounding erythema or drainage along the nail sites. Hyperkeratotic lesion of the right fourth toe.  After debridement there is no underlying ulceration, drainage or any clinical signs of infection present. No open lesions or pre-ulcerative lesions are identified. No pain with calf compression, swelling, warmth, erythema.  Assessment: Patient presents with symptomatic onychomycosis; hyperkeratotic lesion currently on Plavix  Plan: -Treatment options including alternatives, risks, complications were discussed -Nails sharply debrided 10 without complication/bleeding. -Hyperkeratotic lesion sharply debrided x1 without any complications or bleeding -Discussed daily foot inspection. If there are any changes, to call the office immediately.  -Follow-up in 3 months or sooner if any problems are to arise. In the meantime, encouraged to call the office with any questions, concerns, changes symptoms.  Celesta Gentile, DPM

## 2018-05-05 NOTE — Telephone Encounter (Signed)
Pt needs a refill on compound medication.

## 2018-05-11 ENCOUNTER — Encounter: Payer: Self-pay | Admitting: Internal Medicine

## 2018-05-11 ENCOUNTER — Ambulatory Visit (INDEPENDENT_AMBULATORY_CARE_PROVIDER_SITE_OTHER): Payer: Medicare Other | Admitting: Internal Medicine

## 2018-05-11 VITALS — BP 120/74 | HR 61 | Temp 97.9°F | Ht 62.0 in | Wt 141.8 lb

## 2018-05-11 DIAGNOSIS — G5793 Unspecified mononeuropathy of bilateral lower limbs: Secondary | ICD-10-CM | POA: Diagnosis not present

## 2018-05-11 DIAGNOSIS — I6340 Cerebral infarction due to embolism of unspecified cerebral artery: Secondary | ICD-10-CM | POA: Diagnosis not present

## 2018-05-11 DIAGNOSIS — M238X1 Other internal derangements of right knee: Secondary | ICD-10-CM

## 2018-05-11 DIAGNOSIS — M238X2 Other internal derangements of left knee: Secondary | ICD-10-CM | POA: Diagnosis not present

## 2018-05-11 DIAGNOSIS — M2142 Flat foot [pes planus] (acquired), left foot: Secondary | ICD-10-CM

## 2018-05-11 DIAGNOSIS — H401130 Primary open-angle glaucoma, bilateral, stage unspecified: Secondary | ICD-10-CM

## 2018-05-11 DIAGNOSIS — I10 Essential (primary) hypertension: Secondary | ICD-10-CM

## 2018-05-11 DIAGNOSIS — M2141 Flat foot [pes planus] (acquired), right foot: Secondary | ICD-10-CM

## 2018-05-11 MED ORDER — TRAVOPROST (BAK FREE) 0.004 % OP SOLN: 1.0000 [drp] | Freq: Every day | OPHTHALMIC | 5 refills | Status: DC

## 2018-05-11 NOTE — Progress Notes (Signed)
Location:  [facility] clinic Provider:  Kollin Udell [name], D.O., C.M.D.  Goals of Care:  Advanced Directives 11/27/2017  Does Patient Have a Medical Advance Directive? No  Type of Advance Directive -  Does patient want to make changes to medical advance directive? -  Copy of Katie in Chart? -  Would patient like information on creating a medical advance directive? No - Patient declined   Chief Complaint  Patient presents with  . Medical Management of Chronic Issues    Pt is being seen for a routine visit.   . Audit C Screening    Score of 0    HPI: Patient is a 84 y.o. female seen today for medical management of chronic diseases.    Says her arches dropped.  She was told to wear comfortable shoes.  She is wearing brooks ghost sneakers today.  They are hurting.  She is seeing podiatry.  She says the arches still let her know they've fallen and she also has her neuropathy pain.  It's between and ache and burn.   Bothersome at night.  Keeps her feet sticking out from under the covers.  Does sleep through it.    She has two new eye doctors since we met.  She's seen Dr. Edilia Bo and Dr. Haydee Salter (who referred her to Dr. Edilia Bo).  Glaucoma.   Pressure down 12 pt in 3 weeks.  She is also seeing better.  She saw Dr. Eulas Post in between our visits about her knees.  They feel weak.  She had xrays and went to Dr. Marlou Sa and he also imaged them.  He told her they looked like they were 84 year old's knees.  She says they still feel weak.  She's been putting her neuropathy cream on her knees.  Yesterday, she wore her over the knee compression hose and they felt better than it had.  She thinks maybe a knee sleeve.    Past Medical History:  Diagnosis Date  . Anxiety   . Asthma   . Benign essential hypertension   . Bradycardia, drug induced 06/11/2016  . Edema extremities   . GERD (gastroesophageal reflux disease)   . Glaucoma   . Macular degeneration    Per patient   . Osteoarthritis   . PVD  (peripheral vascular disease) ([place])   . Seasonal allergies   . Stroke [facility])     Past Surgical History:  Procedure Laterality Date  . ABDOMINAL HYSTERECTOMY  1981  . TONSILLECTOMY  1953    Allergies  Allergen Reactions  . Aspirin Other (See Comments)    sweats  . Codeine Other (See Comments)  . Iodine Other (See Comments)    Pt is not aware of this allergy, does not recall much info.  . Prednisone Other (See Comments)    sweats  . Lyrica [Pregabalin]     Wobbly, "more than sleepy"  . Benzonatate Itching and Rash  . Sulfa Antibiotics Other (See Comments)    Doesn't remember    Outpatient Encounter Medications as of 05/11/2018  Medication Sig  . acetaminophen (TYLENOL) 500 MG tablet Take 1 tablet (500 mg total) by mouth every 6 (six) hours as needed for moderate pain.  Marland Kitchen albuterol (PROVENTIL HFA;VENTOLIN HFA) 108 (90 Base) MCG/ACT inhaler Inhale 2 puffs into the lungs every 6 (six) hours as needed for wheezing.  Marland Kitchen alum hydroxide-mag trisilicate (GAVISCON) 42-70 MG CHEW chewable tablet Chew 1 tablet by mouth 3 (three) times daily as needed for flatulence (  bowel spasms).  Marland Kitchen amLODipine (NORVASC) 5 MG tablet Take 1 tablet (5 mg total) by mouth daily.  Marland Kitchen atorvastatin (LIPITOR) 10 MG tablet TAKE 1 TABLET BY MOUTH EVERY DAY  . B Complex Vitamins (B COMPLEX PO) Take 1 tablet by mouth daily.  . Brinzolamide-Brimonidine (SIMBRINZA) 1-0.2 % SUSP Apply 1 drop to eye 3 (three) times daily.  . Calcium Carbonate-Vitamin D (CALTRATE 600+D) 600-400 MG-UNIT per tablet Take 2 tablets by mouth daily.  . Cholecalciferol (VITAMIN D3) 2000 units TABS Take 2,000 Units by mouth daily.  . clopidogrel (PLAVIX) 75 MG tablet TAKE 1 TABLET (75 MG TOTAL) BY MOUTH DAILY.  Marland Kitchen Coenzyme Q10 (COQ10) 50 MG CAPS Take 50 mg by mouth daily.  Marland Kitchen docusate sodium (COLACE) 100 MG capsule Take 1 capsule (100 mg total) by mouth daily as needed for mild constipation (to soften stool).  . fluticasone (FLONASE) 50 MCG/ACT nasal  spray Place 1 spray into the nose daily.  Marland Kitchen loratadine (CLARITIN) 5 MG chewable tablet Chew 1 tablet (5 mg total) by mouth daily.  Marland Kitchen loteprednol (LOTEMAX) 0.5 % ophthalmic suspension Place 1 drop into both eyes daily as needed (for dry eyes).   . Multiple Vitamins-Minerals (SENIOR MULTIVITAMIN PLUS) TABS Take 1 tablet by mouth daily.   . NONFORMULARY OR COMPOUNDED ITEM APPLY 1 2 GMS TO AFFECTED AREA 3 4 TIMES DAILY AS NEEDED FOR PAIN  THIS IS A COMPOUNDED CREAM 1 PUMP EQUALS 1 GRAM  . NONFORMULARY OR COMPOUNDED ITEM [facility]:  Peripheral Neuropathy cream - Bupivacaine 1%, Doxepin 3%, Gabapentin 6%, Pentoxifylline 3%, Topiramate 1%, apply 1-2 gramsto affected are 3-4 times daily.  . Omega-3 Fatty Acids (FISH OIL) 1200 MG CPDR Take 1,200 mg by mouth daily. Take one tablet by mouth once daily   . pantoprazole (PROTONIX) 40 MG tablet Take 1 tablet (40 mg total) by mouth daily.  . pilocarpine (PILOCAR) 1 % ophthalmic solution 1 drop into right eye 4 times daily  . RESTASIS 0.05 % ophthalmic emulsion Place 1 drop into both eyes 2 (two) times daily.   . risperiDONE (RISPERDAL) 0.5 MG tablet TAKE 1 TABLET BY MOUTH TWICE A DAY  . SYMBICORT 80-4.5 MCG/ACT inhaler Place 2 puffs into alternate nostrils 2 (two) times daily.  . vitamin C (ASCORBIC ACID) 500 MG tablet Take 500 mg by mouth daily.   No facility-administered encounter medications on file as of 05/11/2018.     Review of Systems:  Review of Systems  Constitutional: Negative for chills, fever and malaise/fatigue.  HENT: Negative for hearing loss.   Eyes: Negative for blurred vision.  Respiratory: Negative for cough and shortness of breath.   Cardiovascular: Negative for chest pain, palpitations and leg swelling.  Gastrointestinal: Negative for abdominal pain, blood in stool, constipation, diarrhea and melena.  Genitourinary: Negative for dysuria.  Musculoskeletal: Negative for falls and joint pain.       Aching in feet  Neurological:  Positive for tingling.  Endo/Heme/Allergies: Does not bruise/bleed easily.  Psychiatric/Behavioral: Positive for memory loss. Negative for depression. The patient is not nervous/anxious and does not have insomnia.     Health Maintenance  Topic Date Due  . INFLUENZA VACCINE  06/08/2018 (Originally 05/07/2018)  . MAMMOGRAM  10/07/2018 (Originally 09/28/2009)  . TETANUS/TDAP  10/07/2018 (Originally 10/04/1943)  . DEXA SCAN  Completed  . PNA vac Low Risk Adult  Completed    Physical Exam: Vitals:   05/11/18 1533  BP: 120/74  Pulse: 61  Temp: 97.9 F (36.6 C)  TempSrc:  Oral  SpO2: 97%  Weight: 141 lb 12.8 oz (64.3 kg)  Height: _0  (1.575 m)   Body mass index is 25.94 kg/m. Physical Exam  Constitutional: She is oriented to person, place, and time. She appears well-developed and well-nourished. No distress.  Eyes:  glasses  Cardiovascular: Normal rate, regular rhythm, normal heart sounds and intact distal pulses.  Pulmonary/Chest: Effort normal and breath sounds normal. No respiratory distress.  Abdominal: Bowel sounds are normal.  Musculoskeletal: Normal range of motion.  Wearing sneakers now, uses cane; bilateral knee laxity  Neurological: She is alert and oriented to person, place, and time.  Skin: Skin is warm and dry. Capillary refill takes less than 2 seconds.  Psychiatric: She has a normal mood and affect.    Labs reviewed: Basic Metabolic Panel: Recent Labs    09/03/17 1117 10/28/17 1210  NA 145* 141  K 3.9 3.9  CL 108* 109  CO2 24 25  GLUCOSE 113* 97  BUN 14 16  CREATININE 0.91 0.85  CALCIUM 9.4 9.6  MG  --  2.1   Liver Function Tests: Recent Labs    09/03/17 1117  AST 21  ALT 17  ALKPHOS 83  BILITOT 0.8  PROT 6.8  ALBUMIN 4.2   No results for input(s): LIPASE, AMYLASE in the last 8760 hours. No results for input(s): AMMONIA in the last 8760 hours. CBC: No results for input(s): WBC, NEUTROABS, HGB, HCT, MCV, PLT in the last 8760 hours. Lipid  Panel: No results for input(s): CHOL, HDL, LDLCALC, TRIG, CHOLHDL, LDLDIRECT in the last 8760 hours. Lab Results  Component Value Date   HGBA1C 5.6 04/22/2016    Assessment/Plan 1. Neuropathy of both feet -ongoing, continue compound from podiatry  2. Joint laxity of left knee 3. Joint laxity of right knee -for 2and3, compression sleeves were written for both knees to get at pharmacy or medical supply for support -imaging showed only mild OA  4. Primary open angle glaucoma of both eyes, unspecified glaucoma stage -pressure improved with Dr. Edilia Bo changing her eye drops  5. Essential hypertension -bp at goal, denied dizziness today  6. Fallen arches -cont use of sneakers and cane, fasciitis exercises  Labs/tests ordered:  Will do labs next visit Next appt:  09/14/2018 med mgt  Remingtyn Depaola L. Evona Westra, D.O. [facility] [address], [place] [address] Cell Phone (Mon-Fri 8am-5pm):  [phone] On Call:  [phone] & follow prompts after 5pm & weekends Office Phone:  [phone] Office Fax:  [phone]

## 2018-05-13 ENCOUNTER — Ambulatory Visit: Payer: Medicare Other | Admitting: Internal Medicine

## 2018-05-15 ENCOUNTER — Telehealth: Payer: Self-pay | Admitting: Podiatry

## 2018-05-15 NOTE — Telephone Encounter (Signed)
I'm calling about my mother. She is a pt of Dr. Leigh Aurora and she saw her PCP this week who diagnosed her with having Plantar Fasciitis, where your arch drops. Mother would like to know what Dr. Jacqualyn Posey thinks she should do about this or should she come in to see him again for him to check her feet again. Please call me back at [phone].

## 2018-05-15 NOTE — Telephone Encounter (Signed)
I informed pt's dtr, Arley Phenix, if pt is experiencing a pain different from what was addressed at the last office visit, we would need to see her again. Katharine Look states at pt's last visit Dr. Jacqualyn Posey stated it sounded like she had tendonitis, then pt saw her PCP, and it was recommended she treat with ice. Katharine Look states she would like to use the ice therapy for a while. I told Katharine Look that icing 3-4 times a day for 15-20 minutes/session protecting the skin with a light cloth would be a good basic treatment for plantar fasciitis and if pt needed to be seen we would be happy to schedule her.

## 2018-06-19 ENCOUNTER — Ambulatory Visit (INDEPENDENT_AMBULATORY_CARE_PROVIDER_SITE_OTHER): Payer: Medicare Other | Admitting: Podiatry

## 2018-06-19 ENCOUNTER — Ambulatory Visit (INDEPENDENT_AMBULATORY_CARE_PROVIDER_SITE_OTHER): Payer: Medicare Other

## 2018-06-19 ENCOUNTER — Encounter: Payer: Self-pay | Admitting: Podiatry

## 2018-06-19 DIAGNOSIS — R2681 Unsteadiness on feet: Secondary | ICD-10-CM

## 2018-06-19 DIAGNOSIS — M722 Plantar fascial fibromatosis: Secondary | ICD-10-CM | POA: Diagnosis not present

## 2018-06-19 DIAGNOSIS — M779 Enthesopathy, unspecified: Secondary | ICD-10-CM | POA: Diagnosis not present

## 2018-06-19 DIAGNOSIS — I6340 Cerebral infarction due to embolism of unspecified cerebral artery: Secondary | ICD-10-CM | POA: Diagnosis not present

## 2018-06-22 NOTE — Progress Notes (Signed)
Subjective: 84 year old female presents today which is been ongoing for the last 2 months since I last saw her.  Symptoms started after her last appointment.  She did relate a fall when she had her knees.  Denies any specific injury to her foot or ankle or lower leg.  Pain with prolonged standing or walking mostly to the top, side in the bottom of both of her feet.  She states that she was told she had plantar fasciitis.  No significant increase in swelling and no redness.  No open sores.  She has been icing 3 times a day which is been very helpful and she is also using a compound cream I got her previously.  Denies any systemic complaints such as fevers, chills, nausea, vomiting. No acute changes since last appointment, and no other complaints at this time.   Objective: AAO x3, NAD DP/PT pulses palpable bilaterally, CRT less than 3 seconds There is decreased range of motion of the ankle, subtalar joint which is chronic.  There is no specific area pinpoint bony tenderness there is no pain to vibratory sensation.  There is tenderness palpation on the plantar medial tubercle of the calcaneus at the insertion of plantar fascia bilaterally as well as along the course of the plantar fascia with the arch of the foot.  There is no pain with lateral compression of the calcaneus.  Plantar fascia, Achilles tendon appear to be intact.  No open lesions or pre-ulcerative lesions.  No pain with calf compression, swelling, warmth, erythema  Assessment: 84 year old female with plantar fasciitis, bilateral foot pain  Plan: -All treatment options discussed with the patient including all alternatives, risks, complications.  -X-rays were obtained and reviewed.  No evidence of acute fracture or stress fracture was identified today.  Osteopenia is present. -Okay to continue to ice as she has been doing we discussed the compound cream she can continue with as well.  Given her foot pain as well as her falls I do think she  will benefit from physical therapy and she wants to proceed with this.  A prescription for [facility] was written today and will start with this.  Also continue supportive shoes. -Patient encouraged to call the office with any questions, concerns, change in symptoms.  -Follow-up as scheduled for routine care or sooner if any issues are to arise or any changes  Trula Slade DPM

## 2018-07-08 DIAGNOSIS — R296 Repeated falls: Secondary | ICD-10-CM | POA: Diagnosis not present

## 2018-07-08 DIAGNOSIS — R269 Unspecified abnormalities of gait and mobility: Secondary | ICD-10-CM | POA: Diagnosis not present

## 2018-07-08 DIAGNOSIS — M25672 Stiffness of left ankle, not elsewhere classified: Secondary | ICD-10-CM | POA: Diagnosis not present

## 2018-07-08 DIAGNOSIS — M25572 Pain in left ankle and joints of left foot: Secondary | ICD-10-CM | POA: Diagnosis not present

## 2018-07-10 DIAGNOSIS — M25572 Pain in left ankle and joints of left foot: Secondary | ICD-10-CM | POA: Diagnosis not present

## 2018-07-10 DIAGNOSIS — M25672 Stiffness of left ankle, not elsewhere classified: Secondary | ICD-10-CM | POA: Diagnosis not present

## 2018-07-10 DIAGNOSIS — R269 Unspecified abnormalities of gait and mobility: Secondary | ICD-10-CM | POA: Diagnosis not present

## 2018-07-10 DIAGNOSIS — R296 Repeated falls: Secondary | ICD-10-CM | POA: Diagnosis not present

## 2018-07-12 ENCOUNTER — Other Ambulatory Visit: Payer: Self-pay | Admitting: Internal Medicine

## 2018-07-13 DIAGNOSIS — M25672 Stiffness of left ankle, not elsewhere classified: Secondary | ICD-10-CM | POA: Diagnosis not present

## 2018-07-13 DIAGNOSIS — M25572 Pain in left ankle and joints of left foot: Secondary | ICD-10-CM | POA: Diagnosis not present

## 2018-07-13 DIAGNOSIS — R296 Repeated falls: Secondary | ICD-10-CM | POA: Diagnosis not present

## 2018-07-13 DIAGNOSIS — R269 Unspecified abnormalities of gait and mobility: Secondary | ICD-10-CM | POA: Diagnosis not present

## 2018-07-14 DIAGNOSIS — R296 Repeated falls: Secondary | ICD-10-CM | POA: Diagnosis not present

## 2018-07-14 DIAGNOSIS — M25572 Pain in left ankle and joints of left foot: Secondary | ICD-10-CM | POA: Diagnosis not present

## 2018-07-14 DIAGNOSIS — R269 Unspecified abnormalities of gait and mobility: Secondary | ICD-10-CM | POA: Diagnosis not present

## 2018-07-14 DIAGNOSIS — M25672 Stiffness of left ankle, not elsewhere classified: Secondary | ICD-10-CM | POA: Diagnosis not present

## 2018-07-16 DIAGNOSIS — R269 Unspecified abnormalities of gait and mobility: Secondary | ICD-10-CM | POA: Diagnosis not present

## 2018-07-16 DIAGNOSIS — M25672 Stiffness of left ankle, not elsewhere classified: Secondary | ICD-10-CM | POA: Diagnosis not present

## 2018-07-16 DIAGNOSIS — M25572 Pain in left ankle and joints of left foot: Secondary | ICD-10-CM | POA: Diagnosis not present

## 2018-07-16 DIAGNOSIS — R296 Repeated falls: Secondary | ICD-10-CM | POA: Diagnosis not present

## 2018-07-21 DIAGNOSIS — R296 Repeated falls: Secondary | ICD-10-CM | POA: Diagnosis not present

## 2018-07-21 DIAGNOSIS — M25672 Stiffness of left ankle, not elsewhere classified: Secondary | ICD-10-CM | POA: Diagnosis not present

## 2018-07-21 DIAGNOSIS — R269 Unspecified abnormalities of gait and mobility: Secondary | ICD-10-CM | POA: Diagnosis not present

## 2018-07-21 DIAGNOSIS — M25572 Pain in left ankle and joints of left foot: Secondary | ICD-10-CM | POA: Diagnosis not present

## 2018-07-24 ENCOUNTER — Other Ambulatory Visit: Payer: Self-pay | Admitting: Internal Medicine

## 2018-07-24 DIAGNOSIS — R296 Repeated falls: Secondary | ICD-10-CM | POA: Diagnosis not present

## 2018-07-24 DIAGNOSIS — M25572 Pain in left ankle and joints of left foot: Secondary | ICD-10-CM | POA: Diagnosis not present

## 2018-07-24 DIAGNOSIS — M25672 Stiffness of left ankle, not elsewhere classified: Secondary | ICD-10-CM | POA: Diagnosis not present

## 2018-07-24 DIAGNOSIS — R269 Unspecified abnormalities of gait and mobility: Secondary | ICD-10-CM | POA: Diagnosis not present

## 2018-07-27 DIAGNOSIS — M25572 Pain in left ankle and joints of left foot: Secondary | ICD-10-CM | POA: Diagnosis not present

## 2018-07-27 DIAGNOSIS — M25672 Stiffness of left ankle, not elsewhere classified: Secondary | ICD-10-CM | POA: Diagnosis not present

## 2018-07-27 DIAGNOSIS — R269 Unspecified abnormalities of gait and mobility: Secondary | ICD-10-CM | POA: Diagnosis not present

## 2018-07-27 DIAGNOSIS — R296 Repeated falls: Secondary | ICD-10-CM | POA: Diagnosis not present

## 2018-07-28 DIAGNOSIS — M25672 Stiffness of left ankle, not elsewhere classified: Secondary | ICD-10-CM | POA: Diagnosis not present

## 2018-07-28 DIAGNOSIS — R269 Unspecified abnormalities of gait and mobility: Secondary | ICD-10-CM | POA: Diagnosis not present

## 2018-07-28 DIAGNOSIS — R296 Repeated falls: Secondary | ICD-10-CM | POA: Diagnosis not present

## 2018-07-28 DIAGNOSIS — M25572 Pain in left ankle and joints of left foot: Secondary | ICD-10-CM | POA: Diagnosis not present

## 2018-07-29 ENCOUNTER — Ambulatory Visit (INDEPENDENT_AMBULATORY_CARE_PROVIDER_SITE_OTHER): Payer: Medicare Other | Admitting: Podiatry

## 2018-07-29 ENCOUNTER — Other Ambulatory Visit: Payer: Self-pay

## 2018-07-29 DIAGNOSIS — M79675 Pain in left toe(s): Secondary | ICD-10-CM | POA: Diagnosis not present

## 2018-07-29 DIAGNOSIS — B351 Tinea unguium: Secondary | ICD-10-CM

## 2018-07-29 DIAGNOSIS — M79674 Pain in right toe(s): Secondary | ICD-10-CM

## 2018-07-29 DIAGNOSIS — H401134 Primary open-angle glaucoma, bilateral, indeterminate stage: Secondary | ICD-10-CM | POA: Diagnosis not present

## 2018-07-30 ENCOUNTER — Ambulatory Visit (INDEPENDENT_AMBULATORY_CARE_PROVIDER_SITE_OTHER): Payer: Medicare Other | Admitting: Cardiology

## 2018-07-30 ENCOUNTER — Encounter: Payer: Self-pay | Admitting: Cardiology

## 2018-07-30 ENCOUNTER — Ambulatory Visit: Payer: Medicare Other | Admitting: Nurse Practitioner

## 2018-07-30 VITALS — BP 134/60 | HR 59 | Ht 62.0 in | Wt 143.0 lb

## 2018-07-30 DIAGNOSIS — R001 Bradycardia, unspecified: Secondary | ICD-10-CM

## 2018-07-30 DIAGNOSIS — R600 Localized edema: Secondary | ICD-10-CM | POA: Diagnosis not present

## 2018-07-30 DIAGNOSIS — I10 Essential (primary) hypertension: Secondary | ICD-10-CM | POA: Diagnosis not present

## 2018-07-30 DIAGNOSIS — I6340 Cerebral infarction due to embolism of unspecified cerebral artery: Secondary | ICD-10-CM | POA: Diagnosis not present

## 2018-07-30 DIAGNOSIS — T50905A Adverse effect of unspecified drugs, medicaments and biological substances, initial encounter: Secondary | ICD-10-CM

## 2018-07-30 DIAGNOSIS — H353132 Nonexudative age-related macular degeneration, bilateral, intermediate dry stage: Secondary | ICD-10-CM | POA: Diagnosis not present

## 2018-07-30 DIAGNOSIS — T50905D Adverse effect of unspecified drugs, medicaments and biological substances, subsequent encounter: Secondary | ICD-10-CM

## 2018-07-30 NOTE — Progress Notes (Signed)
Cardiology Office Note:    Date:  07/30/2018   ID:  [name] [name], DOB 01/11/1924, MRN [id number]  PCP:  Gayland Curry, DO  Cardiologist:  No primary care provider on file.    Referring MD: Gayland Curry, DO   Chief Complaint  Patient presents with  . Hypertension    History of Present Illness:    [name] [name] is a 84 y.o. female with a hx of HTN, asthma, chronic LE edema and bradycardia.  She is here today for followup and is doing well.  She denies any chest pain or pressure, SOB, DOE, PND, orthopnea, LE edema, dizziness, palpitations or syncope. She is compliant with her meds and is tolerating meds with no SE.    Past Medical History:  Diagnosis Date  . Anxiety   . Asthma   . Benign essential hypertension   . Bradycardia, drug induced 06/11/2016  . Edema extremities   . GERD (gastroesophageal reflux disease)   . Glaucoma   . Macular degeneration    Per patient   . Osteoarthritis   . PVD (peripheral vascular disease) ([place])   . Seasonal allergies   . Stroke [facility])     Past Surgical History:  Procedure Laterality Date  . ABDOMINAL HYSTERECTOMY  1981  . TONSILLECTOMY  1953    Current Medications: Current Meds  Medication Sig  . acetaminophen (TYLENOL) 500 MG tablet Take 1 tablet (500 mg total) by mouth every 6 (six) hours as needed for moderate pain.  Marland Kitchen albuterol (PROVENTIL HFA;VENTOLIN HFA) 108 (90 Base) MCG/ACT inhaler Inhale 2 puffs into the lungs every 6 (six) hours as needed for wheezing.  Marland Kitchen alum hydroxide-mag trisilicate (GAVISCON) 90-24 MG CHEW chewable tablet Chew 1 tablet by mouth 3 (three) times daily as needed for flatulence (bowel spasms).  Marland Kitchen amLODipine (NORVASC) 5 MG tablet Take 1 tablet (5 mg total) by mouth daily.  Marland Kitchen atorvastatin (LIPITOR) 10 MG tablet TAKE 1 TABLET BY MOUTH EVERY DAY  . B Complex Vitamins (B COMPLEX PO) Take 1 tablet by mouth daily.  . Brinzolamide-Brimonidine (SIMBRINZA) 1-0.2 % SUSP Apply 1 drop to eye 3 (three) times daily.    . Calcium Carbonate-Vit D-Min (CALCIUM 600+D PLUS MINERALS) 600-400 MG-UNIT TABS Take by mouth.  . Calcium Carbonate-Vitamin D (CALTRATE 600+D) 600-400 MG-UNIT per tablet Take 2 tablets by mouth daily.  . Cholecalciferol (VITAMIN D3) 2000 units TABS Take 2,000 Units by mouth daily.  . clopidogrel (PLAVIX) 75 MG tablet TAKE 1 TABLET (75 MG TOTAL) BY MOUTH DAILY.  Marland Kitchen Coenzyme Q10 (COQ10) 50 MG CAPS Take 50 mg by mouth daily.  Marland Kitchen docusate sodium (COLACE) 100 MG capsule Take 1 capsule (100 mg total) by mouth daily as needed for mild constipation (to soften stool).  . Doxepin HCl POWD   . fluticasone (FLONASE) 50 MCG/ACT nasal spray Place 1 spray into the nose daily.  Marland Kitchen loratadine (CLARITIN) 5 MG chewable tablet Chew 1 tablet (5 mg total) by mouth daily.  Marland Kitchen loteprednol (LOTEMAX) 0.5 % ophthalmic suspension Place 1 drop into both eyes daily as needed (for dry eyes).   . Multiple Vitamins-Minerals (SENIOR MULTIVITAMIN PLUS) TABS Take 1 tablet by mouth daily.   . NONFORMULARY OR COMPOUNDED ITEM APPLY 1 2 GMS TO AFFECTED AREA 3 4 TIMES DAILY AS NEEDED FOR PAIN  THIS IS A COMPOUNDED CREAM 1 PUMP EQUALS 1 GRAM  . NONFORMULARY OR COMPOUNDED ITEM [facility]:  Peripheral Neuropathy cream - Bupivacaine 1%, Doxepin 3%, Gabapentin 6%, Pentoxifylline 3%,  Topiramate 1%, apply 1-2 gramsto affected are 3-4 times daily.  . Omega-3 Fatty Acids (FISH OIL) 1200 MG CPDR Take 1,200 mg by mouth daily. Take one tablet by mouth once daily   . pantoprazole (PROTONIX) 40 MG tablet Take 1 tablet (40 mg total) by mouth daily.  . pilocarpine (PILOCAR) 1 % ophthalmic solution 1 drop into right eye 4 times daily  . RESTASIS 0.05 % ophthalmic emulsion Place 1 drop into both eyes 2 (two) times daily.   . risperiDONE (RISPERDAL) 0.5 MG tablet TAKE 1 TABLET BY MOUTH TWICE A DAY  . SYMBICORT 80-4.5 MCG/ACT inhaler Place 2 puffs into alternate nostrils 2 (two) times daily.  . Travoprost, BAK Free, (TRAVATAN Z) 0.004 % SOLN  ophthalmic solution Place 1 drop into both eyes at bedtime.  . vitamin C (ASCORBIC ACID) 500 MG tablet Take 500 mg by mouth daily.  . [DISCONTINUED] nebivolol (BYSTOLIC) 2.5 MG tablet TAKE 1 TABLET BY MOUTH EVERY DAY     Allergies:   Aspirin; Codeine; Iodine; Prednisone; Lyrica [pregabalin]; Benzonatate; and Sulfa antibiotics   Social History   Socioeconomic History  . Marital status: Widowed    Spouse name: Not on file  . Number of children: Not on file  . Years of education: Not on file  . Highest education level: Not on file  Occupational History  . Not on file  Social Needs  . Financial resource strain: Not on file  . Food insecurity:    Worry: Not on file    Inability: Not on file  . Transportation needs:    Medical: Not on file    Non-medical: Not on file  Tobacco Use  . Smoking status: Never Smoker  . Smokeless tobacco: Never Used  Substance and Sexual Activity  . Alcohol use: No    Alcohol/week: 0.0 standard drinks  . Drug use: No  . Sexual activity: Not Currently  Lifestyle  . Physical activity:    Days per week: Not on file    Minutes per session: Not on file  . Stress: Not on file  Relationships  . Social connections:    Talks on phone: Not on file    Gets together: Not on file    Attends religious service: Not on file    Active member of club or organization: Not on file    Attends meetings of clubs or organizations: Not on file    Relationship status: Not on file  Other Topics Concern  . 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  ROS:   Please see the history of present illness.    ROS  All other systems reviewed and negative.   EKGs/Labs/Other Studies Reviewed:    The following studies  were reviewed today:  none  EKG:  EKG is not ordered today.  Recent Labs: 09/03/2017: ALT 17 10/28/2017: BUN 16; Creat 0.85; Magnesium 2.1; Potassium 3.9; Sodium 141   Recent Lipid Panel    Component Value Date/Time   CHOL 159 03/24/2017 1200   CHOL 187 08/17/2013 0836   TRIG 46 03/24/2017 1200   HDL 109 03/24/2017 1200   HDL 105 08/17/2013 0836   CHOLHDL 1.5 03/24/2017 1200   VLDL 9 03/24/2017 1200  LDLCALC 41 03/24/2017 1200   LDLCALC 73 08/17/2013 0836    Physical Exam:    VS:  BP 134/60   Pulse (!) 59   Ht 5\' 2"  (1.575 m)   Wt 143 lb (64.9 kg)   SpO2 98%   BMI 26.16 kg/m     Wt Readings from Last 3 Encounters:  07/30/18 143 lb (64.9 kg)  05/11/18 141 lb 12.8 oz (64.3 kg)  03/10/18 142 lb 12.8 oz (64.8 kg)     GEN:  Well nourished, well developed in no acute distress HEENT: Normal NECK: No JVD; No carotid bruits LYMPHATICS: No lymphadenopathy CARDIAC: RRR, no murmurs, rubs, gallops RESPIRATORY:  Clear to auscultation without rales, wheezing or rhonchi  ABDOMEN: Soft, non-tender, non-distended MUSCULOSKELETAL:  No edema; No deformity  SKIN: Warm and dry NEUROLOGIC:  Alert and oriented x 3 PSYCHIATRIC:  Normal affect   ASSESSMENT:    1. Essential hypertension   2. Bradycardia, drug induced   3. Edema of extremities    PLAN:    In order of problems listed above:  1.  HTN - BP is controlled on exam today.  She will continue on amlodipine 5 mg daily.  2.  Bradycardia -she is asymptomatic on exam today with heart rate 59 bpm.  3.  Le edema - she has not had to take any diuretics.   Medication Adjustments/Labs and Tests Ordered: Current medicines are reviewed at length with the patient today.  Concerns regarding medicines are outlined above.  No orders of the defined types were placed in this encounter.  No orders of the defined types were placed in this encounter.   Signed, Fransico Him, MD  07/30/2018 1:40 PM    [place]

## 2018-07-30 NOTE — Patient Instructions (Signed)
Medication Instructions:  Your physician recommends that you continue on your current medications as directed. Please refer to the Current Medication list given to you today.  If you need a refill on your cardiac medications before your next appointment, please call your pharmacy.   Lab work:  If you have labs (blood work) drawn today and your tests are completely normal, you will receive your results only by: Marland Kitchen MyChart Message (if you have MyChart) OR . A paper copy in the mail If you have any lab test that is abnormal or we need to change your treatment, we will call you to review the results.  Testing/Procedures: None ordered  Follow-Up: At [facility], you and your health needs are our priority.  As part of our continuing mission to provide you with exceptional heart care, we have created designated Provider Care Teams.  These Care Teams include your primary Cardiologist (physician) and Advanced Practice Providers (APPs -  Physician Assistants and Nurse Practitioners) who all work together to provide you with the care you need, when you need it. You will need a follow up appointment in 1 years.  Please call our office 2 months in advance to schedule this appointment.  You may see Dr. Radford Pax or one of the following Advanced Practice Providers on your designated Care Team:   Kings Grant, PA-C Melina Copa, PA-C . Ermalinda Barrios, PA-C  Any Other Special Instructions Will Be Listed Below (If Applicable).

## 2018-07-31 ENCOUNTER — Ambulatory Visit (INDEPENDENT_AMBULATORY_CARE_PROVIDER_SITE_OTHER): Payer: Medicare Other

## 2018-07-31 ENCOUNTER — Ambulatory Visit: Payer: Medicare Other

## 2018-07-31 ENCOUNTER — Encounter: Payer: Self-pay | Admitting: Nurse Practitioner

## 2018-07-31 ENCOUNTER — Ambulatory Visit (INDEPENDENT_AMBULATORY_CARE_PROVIDER_SITE_OTHER): Payer: Medicare Other | Admitting: Nurse Practitioner

## 2018-07-31 VITALS — BP 132/80 | HR 85 | Temp 98.0°F | Resp 18 | Ht 62.0 in | Wt 142.4 lb

## 2018-07-31 VITALS — BP 132/80 | HR 85 | Temp 98.0°F | Ht 62.0 in | Wt 142.4 lb

## 2018-07-31 DIAGNOSIS — I6340 Cerebral infarction due to embolism of unspecified cerebral artery: Secondary | ICD-10-CM

## 2018-07-31 DIAGNOSIS — M2669 Other specified disorders of temporomandibular joint: Secondary | ICD-10-CM | POA: Diagnosis not present

## 2018-07-31 DIAGNOSIS — Z0000 Encounter for general adult medical examination without abnormal findings: Secondary | ICD-10-CM

## 2018-07-31 DIAGNOSIS — M26649 Arthritis of unspecified temporomandibular joint: Secondary | ICD-10-CM

## 2018-07-31 NOTE — Patient Instructions (Signed)
Avoid foods that are hard to chew or require lots of chewing Can alternate ice/heat with increase in pain To use tylenol as needed for pain.  Can take tylenol 1000 mg every 8 hours as needed    Temporomandibular Joint Syndrome Temporomandibular joint (TMJ) syndrome is a condition that affects the joints between your jaw and your skull. The TMJs are located near your ears and allow your jaw to open and close. These joints and the nearby muscles are involved in all movements of the jaw. People with TMJ syndrome have pain in the area of these joints and muscles. Chewing, biting, or other movements of the jaw can be difficult or painful. TMJ syndrome can be caused by various things. In many cases, the condition is mild and goes away within a few weeks. For some people, the condition can become a long-term problem. What are the causes? Possible causes of TMJ syndrome include:  Grinding your teeth or clenching your jaw. Some people do this when they are under stress.  Arthritis.  Injury to the jaw.  Head or neck injury.  Teeth or dentures that are not aligned well.  In some cases, the cause of TMJ syndrome may not be known. What are the signs or symptoms? The most common symptom is an aching pain on the side of the head in the area of the TMJ. Other symptoms may include:  Pain when moving your jaw, such as when chewing or biting.  Being unable to open your jaw all the way.  Making a clicking sound when you open your mouth.  Headache.  Earache.  Neck or shoulder pain.  How is this diagnosed? Diagnosis can usually be made based on your symptoms, your medical history, and a physical exam. Your health care provider may check the range of motion of your jaw. Imaging tests, such as X-rays or an MRI, are sometimes done. You may need to see your dentist to determine if your teeth and jaw are lined up correctly. How is this treated? TMJ syndrome often goes away on its own. If treatment is  needed, the options may include:  Eating soft foods and applying ice or heat.  Medicines to relieve pain or inflammation.  Medicines to relax the muscles.  A splint, bite plate, or mouthpiece to prevent teeth grinding or jaw clenching.  Relaxation techniques or counseling to help reduce stress.  Transcutaneous electrical nerve stimulation (TENS). This helps to relieve pain by applying an electrical current through the skin.  Acupuncture. This is sometimes helpful to relieve pain.  Jaw surgery. This is rarely needed.  Follow these instructions at home:  Take medicines only as directed by your health care provider.  Eat a soft diet if you are having trouble chewing.  Apply ice to the painful area. ? Put ice in a plastic bag. ? Place a towel between your skin and the bag. ? Leave the ice on for 20 minutes, 2-3 times a day.  Apply a warm compress to the painful area as directed.  Massage your jaw area and perform any jaw stretching exercises as recommended by your health care provider.  If you were given a mouthpiece or bite plate, wear it as directed.  Avoid foods that require a lot of chewing. Do not chew gum.  Keep all follow-up visits as directed by your health care provider. This is important. Contact a health care provider if:  You are having trouble eating.  You have new or worsening symptoms. Get help  right away if:  Your jaw locks open or closed. This information is not intended to replace advice given to you by your health care provider. Make sure you discuss any questions you have with your health care provider. Document Released: 06/18/2001 Document Revised: 05/23/2016 Document Reviewed: 04/28/2014 Elsevier Interactive Patient Education  [employer].

## 2018-07-31 NOTE — Progress Notes (Signed)
Subjective:   [name] [name] is a 84 y.o. female who presents for Medicare Annual (Subsequent) preventive examination.  Last AWV-03/24/2017    Objective:     Vitals: BP 132/80 (BP Location: Left Arm, Patient Position: Sitting)   Pulse 85   Temp 98 F (36.7 C) (Oral)   Ht 5\' 2"  (1.575 m)   Wt 142 lb 6.4 oz (64.6 kg)   SpO2 98%   BMI 26.05 kg/m   Body mass index is 26.05 kg/m.  Advanced Directives 07/31/2018 11/27/2017 07/31/2017 07/16/2017 03/24/2017 02/17/2017 09/19/2016  Does Patient Have a Medical Advance Directive? Yes No Yes No Yes Yes No  Type of Paramedic of [place];Living will - [place];Living will [place];Living will -  Does patient want to make changes to medical advance directive? No - Patient declined - - - No - Patient declined - -  Copy of [place] in Chart? No - copy requested - No - copy requested - No - copy requested No - copy requested -  Would patient like information on creating a medical advance directive? - No - Patient declined - - - - -    Tobacco Social History   Tobacco Use  Smoking Status Never Smoker  Smokeless Tobacco Never Used     Counseling given: Not Answered   Clinical Intake:  Pre-visit preparation completed: No  Pain : No/denies pain     Diabetes: No  How often do you need to have someone help you when you read instructions, pamphlets, or other written materials from your doctor or pharmacy?: 2 - Rarely What is the last grade level you completed in school?: Cosmetology  Interpreter Needed?: No  Information entered by :: Tyson Dense, RN  Past Medical History:  Diagnosis Date  . Anxiety   . Asthma   . Benign essential hypertension   . Bradycardia, drug induced 06/11/2016  . Edema extremities   . GERD (gastroesophageal reflux disease)   . Glaucoma   . Macular degeneration    Per patient   .  Osteoarthritis   . PVD (peripheral vascular disease) ([place])   . Seasonal allergies   . Stroke [facility])    Past Surgical History:  Procedure Laterality Date  . ABDOMINAL HYSTERECTOMY  1981  . TONSILLECTOMY  1953   Family History  Problem Relation Age of Onset  . Stroke Father   . Hypertension Father   . Heart disease Mother   . Hypertension Mother   . Diabetes Mother    Social History   Socioeconomic History  . Marital status: Widowed    Spouse name: Not on file  . Number of children: Not on file  . Years of education: Not on file  . Highest education level: Not on file  Occupational History  . Not on file  Social Needs  . Financial resource strain: Not hard at all  . Food insecurity:    Worry: Never true    Inability: Never true  . Transportation needs:    Medical: No    Non-medical: No  Tobacco Use  . Smoking status: Never Smoker  . Smokeless tobacco: Never Used  Substance and Sexual Activity  . Alcohol use: No    Alcohol/week: 0.0 standard drinks  . Drug use: No  . Sexual activity: Not Currently  Lifestyle  . Physical activity:    Days per week: 3 days    Minutes per session:  30 min  . Stress: Not at all  Relationships  . Social connections:    Talks on phone: More than three times a week    Gets together: More than three times a week    Attends religious service: More than 4 times per year    Active member of club or organization: No    Attends meetings of clubs or organizations: Never    Relationship status: Widowed  Other Topics Concern  . 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 acetaminophen (TYLENOL) 500 MG tablet Take 1 tablet (500 mg total) by mouth every 6 (six) hours as needed for moderate pain.  Marland Kitchen albuterol (PROVENTIL HFA;VENTOLIN HFA) 108 (90 Base) MCG/ACT inhaler Inhale 2 puffs into  the lungs every 6 (six) hours as needed for wheezing.  Marland Kitchen alum hydroxide-mag trisilicate (GAVISCON) 35-36 MG CHEW chewable tablet Chew 1 tablet by mouth 3 (three) times daily as needed for flatulence (bowel spasms).  Marland Kitchen amLODipine (NORVASC) 5 MG tablet Take 1 tablet (5 mg total) by mouth daily.  Marland Kitchen atorvastatin (LIPITOR) 10 MG tablet TAKE 1 TABLET BY MOUTH EVERY DAY  . B Complex Vitamins (B COMPLEX PO) Take 1 tablet by mouth daily.  . Brinzolamide-Brimonidine (SIMBRINZA) 1-0.2 % SUSP Apply 1 drop to eye 3 (three) times daily.  . Calcium Carbonate-Vit D-Min (CALCIUM 600+D PLUS MINERALS) 600-400 MG-UNIT TABS Take by mouth.  . Calcium Carbonate-Vitamin D (CALTRATE 600+D) 600-400 MG-UNIT per tablet Take 2 tablets by mouth daily.  . Cholecalciferol (VITAMIN D3) 2000 units TABS Take 2,000 Units by mouth daily.  . clopidogrel (PLAVIX) 75 MG tablet TAKE 1 TABLET (75 MG TOTAL) BY MOUTH DAILY.  Marland Kitchen Coenzyme Q10 (COQ10) 50 MG CAPS Take 50 mg by mouth daily.  Marland Kitchen docusate sodium (COLACE) 100 MG capsule Take 1 capsule (100 mg total) by mouth daily as needed for mild constipation (to soften stool).  . Doxepin HCl POWD   . fluticasone (FLONASE) 50 MCG/ACT nasal spray Place 1 spray into the nose daily.  Marland Kitchen loratadine (CLARITIN) 5 MG chewable tablet Chew 1 tablet (5 mg total) by mouth daily.  Marland Kitchen loteprednol (LOTEMAX) 0.5 % ophthalmic suspension Place 1 drop into both eyes daily as needed (for dry eyes).   . Multiple Vitamins-Minerals (SENIOR MULTIVITAMIN PLUS) TABS Take 1 tablet by mouth daily.   . NONFORMULARY OR COMPOUNDED ITEM APPLY 1 2 GMS TO AFFECTED AREA 3 4 TIMES DAILY AS NEEDED FOR PAIN  THIS IS A COMPOUNDED CREAM 1 PUMP EQUALS 1 GRAM  . NONFORMULARY OR COMPOUNDED ITEM [facility]:  Peripheral Neuropathy cream - Bupivacaine 1%, Doxepin 3%, Gabapentin 6%, Pentoxifylline 3%, Topiramate 1%, apply 1-2 gramsto affected are 3-4 times daily.  . Omega-3 Fatty Acids (FISH OIL) 1200 MG CPDR Take 1,200 mg by mouth  daily. Take one tablet by mouth once daily   . pantoprazole (PROTONIX) 40 MG tablet Take 1 tablet (40 mg total) by mouth daily.  . pilocarpine (PILOCAR) 1 % ophthalmic solution 1 drop into right eye 4 times daily  . RESTASIS 0.05 % ophthalmic emulsion Place 1 drop into both eyes 2 (two) times daily.   . risperiDONE (RISPERDAL) 0.5 MG tablet TAKE 1 TABLET BY MOUTH TWICE A DAY  . SYMBICORT 80-4.5 MCG/ACT inhaler Place 2 puffs into alternate nostrils 2 (two) times daily.  Marland Kitchen  Travoprost, BAK Free, (TRAVATAN Z) 0.004 % SOLN ophthalmic solution Place 1 drop into both eyes at bedtime.  . vitamin C (ASCORBIC ACID) 500 MG tablet Take 500 mg by mouth daily.   No facility-administered encounter medications on file as of 07/31/2018.     Activities of Daily Living In your present state of health, do you have any difficulty performing the following activities: 07/31/2018  Hearing? N  Vision? N  Difficulty concentrating or making decisions? N  Walking or climbing stairs? N  Dressing or bathing? N  Doing errands, shopping? N  Preparing Food and eating ? N  Using the Toilet? N  In the past six months, have you accidently leaked urine? N  Do you have problems with loss of bowel control? N  Managing your Medications? N  Managing your Finances? N  Housekeeping or managing your Housekeeping? N  Some recent data might be hidden    Patient Care Team: Gayland Curry, DO as PCP - General (Geriatric Medicine) Sueanne Margarita, MD as PCP - Cardiology (Cardiology) Trula Slade, DPM as Consulting Physician (Podiatry)    Assessment:   This is a routine wellness examination for [place].  Exercise Activities and Dietary recommendations Current Exercise Habits: Structured exercise class, Type of exercise: Other - see comments(PT), Time (Minutes): 30, Frequency (Times/Week): 3, Weekly Exercise (Minutes/Week): 90, Intensity: Mild, Exercise limited by: orthopedic condition(s)  Goals    . Walking     Pt  would like to walk with someone 10 days out of the month       Fall Risk Fall Risk  07/31/2018 05/11/2018 11/27/2017 07/31/2017 07/16/2017  Falls in the past year? Yes No No No No  Number falls in past yr: 1 - - - -  Injury with Fall? No - - - -   Is the patient's home free of loose throw rugs in walkways, pet beds, electrical cords, etc?   yes      Grab bars in the bathroom? yes      Handrails on the stairs?   yes      Adequate lighting?   yes  Timed Get Up and Go permformed: 16 seconds  Depression Screen PHQ 2/9 Scores 07/31/2018 11/27/2017 03/24/2017 12/23/2016  PHQ - 2 Score 0 0 0 0     Cognitive Function MMSE - Mini Mental State Exam 07/31/2018 03/24/2017 01/10/2015 06/11/2013  Orientation to time 3 4 4 5   Orientation to Place 5 5 5 5   Registration 3 3 3 3   Attention/ Calculation 5 4 4 5   Recall 2 1 2 2   Language- name 2 objects 2 2 2 2   Language- repeat 1 1 1 1   Language- follow 3 step command 3 2 3 3   Language- read & follow direction 1 1 1 1   Write a sentence 1 1 1 1   Copy design 1 1 1 1   Total score 27 25 27 29         Immunization History  Administered Date(s) Administered  . Influenza, High Dose Seasonal PF 07/16/2017, 07/20/2018  . Influenza,inj,Quad PF,6+ Mos 06/11/2013, 06/30/2014, 07/17/2015, 06/27/2016  . Influenza-Unspecified 07/07/2012  . Pneumococcal Conjugate-13 11/07/2014  . Pneumococcal Polysaccharide-23 10/08/2007  . Zoster Recombinat (Shingrix) 07/31/2017, 12/09/2017    Qualifies for Shingles Vaccine? Up to date  Screening Tests Health Maintenance  Topic Date Due  . MAMMOGRAM  10/07/2018 (Originally 09/28/2009)  . TETANUS/TDAP  10/07/2018 (Originally 10/04/1943)  . INFLUENZA VACCINE  Completed  . DEXA SCAN  Completed  .  PNA vac Low Risk Adult  Completed    Cancer Screenings: Lung: Low Dose CT Chest recommended if Age 44-80 years, 30 pack-year currently smoking OR have quit w/in 15years. Patient does not qualify. Breast:  Up to date on  Mammogram? Yes   Up to date of Bone Density/Dexa? Yes Colorectal: up to date  Additional Screenings:  Hepatitis C Screening: declined TDAP due: declined     Plan:  I have personally reviewed and addressed the Medicare Annual Wellness questionnaire and have noted the following in the patient's chart:  A. Medical and social history B. Use of alcohol, tobacco or illicit drugs  C. Current medications and supplements D. Functional ability and status E.  Nutritional status F.  Physical activity G. Advance directives H. List of other physicians I.  Hospitalizations, surgeries, and ER visits in previous 12 months J.  [place] to include hearing, vision, cognitive, depression L. Referrals and appointments - none  In addition, I have reviewed and discussed with patient certain preventive protocols, quality metrics, and best practice recommendations. A written personalized care plan for preventive services as well as general preventive health recommendations were provided to patient.  See attached scanned questionnaire for additional information.   Signed,   Tyson Dense, RN Nurse Health Advisor  Patient Concerns: None

## 2018-07-31 NOTE — Patient Instructions (Signed)
[name] [name] , Thank you for taking time to come for your Medicare Wellness Visit. I appreciate your ongoing commitment to your health goals. Please review the following plan we discussed and let me know if I can assist you in the future.   Screening recommendations/referrals: Colonoscopy excluded, over age 82 Mammogram excluded, over age 83 Bone Density up to date Recommended yearly ophthalmology/optometry visit for glaucoma screening and checkup Recommended yearly dental visit for hygiene and checkup  Vaccinations: Influenza vaccine up to date Pneumococcal vaccine up to date, completed Tdap vaccine due, declined Shingles vaccine up to date, completed    Advanced directives: Please bring [place] a copy of your living will and health care power of attorneyt.   Conditions/risks identified: fall risk  Next appointment: Tyson Dense, RN 08/20/2019 @ 11am   Preventive Care 65 Years and Older, Female Preventive care refers to lifestyle choices and visits with your health care provider that can promote health and wellness. What does preventive care include?  A yearly physical exam. This is also called an annual well check.  Dental exams once or twice a year.  Routine eye exams. Ask your health care provider how often you should have your eyes checked.  Personal lifestyle choices, including:  Daily care of your teeth and gums.  Regular physical activity.  Eating a healthy diet.  Avoiding tobacco and drug use.  Limiting alcohol use.  Practicing safe sex.  Taking low-dose aspirin every day.  Taking vitamin and mineral supplements as recommended by your health care provider. What happens during an annual well check? The services and screenings done by your health care provider during your annual well check will depend on your age, overall health, lifestyle risk factors, and family history of disease. Counseling  Your health care provider may ask you questions about  your:  Alcohol use.  Tobacco use.  Drug use.  Emotional well-being.  Home and relationship well-being.  Sexual activity.  Eating habits.  History of falls.  Memory and ability to understand (cognition).  Work and work Statistician.  Reproductive health. Screening  You may have the following tests or measurements:  Height, weight, and BMI.  Blood pressure.  Lipid and cholesterol levels. These may be checked every 5 years, or more frequently if you are over 73 years old.  Skin check.  Lung cancer screening. You may have this screening every year starting at age 70 if you have a 30-pack-year history of smoking and currently smoke or have quit within the past 15 years.  Fecal occult blood test (FOBT) of the stool. You may have this test every year starting at age 65.  Flexible sigmoidoscopy or colonoscopy. You may have a sigmoidoscopy every 5 years or a colonoscopy every 10 years starting at age 32.  Hepatitis C blood test.  Hepatitis B blood test.  Sexually transmitted disease (STD) testing.  Diabetes screening. This is done by checking your blood sugar (glucose) after you have not eaten for a while (fasting). You may have this done every 1-3 years.  Bone density scan. This is done to screen for osteoporosis. You may have this done starting at age 21.  Mammogram. This may be done every 1-2 years. Talk to your health care provider about how often you should have regular mammograms. Talk with your health care provider about your test results, treatment options, and if necessary, the need for more tests. Vaccines  Your health care provider may recommend certain vaccines, such as:  Influenza vaccine. This is  recommended every year.  Tetanus, diphtheria, and acellular pertussis (Tdap, Td) vaccine. You may need a Td booster every 10 years.  Zoster vaccine. You may need this after age 29.  Pneumococcal 13-valent conjugate (PCV13) vaccine. One dose is recommended  after age 1.  Pneumococcal polysaccharide (PPSV23) vaccine. One dose is recommended after age 55. Talk to your health care provider about which screenings and vaccines you need and how often you need them. This information is not intended to replace advice given to you by your health care provider. Make sure you discuss any questions you have with your health care provider. Document Released: 10/20/2015 Document Revised: 06/12/2016 Document Reviewed: 07/25/2015 Elsevier Interactive Patient Education  2017 [place] Prevention in the Home Falls can cause injuries. They can happen to people of all ages. There are many things you can do to make your home safe and to help prevent falls. What can I do on the outside of my home?  Regularly fix the edges of walkways and driveways and fix any cracks.  Remove anything that might make you trip as you walk through a door, such as a raised step or threshold.  Trim any bushes or trees on the path to your home.  Use bright outdoor lighting.  Clear any walking paths of anything that might make someone trip, such as rocks or tools.  Regularly check to see if handrails are loose or broken. Make sure that both sides of any steps have handrails.  Any raised decks and porches should have guardrails on the edges.  Have any leaves, snow, or ice cleared regularly.  Use sand or salt on walking paths during winter.  Clean up any spills in your garage right away. This includes oil or grease spills. What can I do in the bathroom?  Use night lights.  Install grab bars by the toilet and in the tub and shower. Do not use towel bars as grab bars.  Use non-skid mats or decals in the tub or shower.  If you need to sit down in the shower, use a plastic, non-slip stool.  Keep the floor dry. Clean up any water that spills on the floor as soon as it happens.  Remove soap buildup in the tub or shower regularly.  Attach bath mats securely with  double-sided non-slip rug tape.  Do not have throw rugs and other things on the floor that can make you trip. What can I do in the bedroom?  Use night lights.  Make sure that you have a light by your bed that is easy to reach.  Do not use any sheets or blankets that are too big for your bed. They should not hang down onto the floor.  Have a firm chair that has side arms. You can use this for support while you get dressed.  Do not have throw rugs and other things on the floor that can make you trip. What can I do in the kitchen?  Clean up any spills right away.  Avoid walking on wet floors.  Keep items that you use a lot in easy-to-reach places.  If you need to reach something above you, use a strong step stool that has a grab bar.  Keep electrical cords out of the way.  Do not use floor polish or wax that makes floors slippery. If you must use wax, use non-skid floor wax.  Do not have throw rugs and other things on the floor that can make you trip.  What can I do with my stairs?  Do not leave any items on the stairs.  Make sure that there are handrails on both sides of the stairs and use them. Fix handrails that are broken or loose. Make sure that handrails are as long as the stairways.  Check any carpeting to make sure that it is firmly attached to the stairs. Fix any carpet that is loose or worn.  Avoid having throw rugs at the top or bottom of the stairs. If you do have throw rugs, attach them to the floor with carpet tape.  Make sure that you have a light switch at the top of the stairs and the bottom of the stairs. If you do not have them, ask someone to add them for you. What else can I do to help prevent falls?  Wear shoes that:  Do not have high heels.  Have rubber bottoms.  Are comfortable and fit you well.  Are closed at the toe. Do not wear sandals.  If you use a stepladder:  Make sure that it is fully opened. Do not climb a closed stepladder.  Make  sure that both sides of the stepladder are locked into place.  Ask someone to hold it for you, if possible.  Clearly mark and make sure that you can see:  Any grab bars or handrails.  First and last steps.  Where the edge of each step is.  Use tools that help you move around (mobility aids) if they are needed. These include:  Canes.  Walkers.  Scooters.  Crutches.  Turn on the lights when you go into a dark area. Replace any light bulbs as soon as they burn out.  Set up your furniture so you have a clear path. Avoid moving your furniture around.  If any of your floors are uneven, fix them.  If there are any pets around you, be aware of where they are.  Review your medicines with your doctor. Some medicines can make you feel dizzy. This can increase your chance of falling. Ask your doctor what other things that you can do to help prevent falls. This information is not intended to replace advice given to you by your health care provider. Make sure you discuss any questions you have with your health care provider. Document Released: 07/20/2009 Document Revised: 02/29/2016 Document Reviewed: 10/28/2014 Elsevier Interactive Patient Education  2017 [employer].

## 2018-07-31 NOTE — Progress Notes (Signed)
Careteam: Patient Care Team: Gayland Curry, DO as PCP - General (Geriatric Medicine) Sueanne Margarita, MD as PCP - Cardiology (Cardiology) Trula Slade, DPM as Consulting Physician (Podiatry)  Advanced Directive information    Allergies  Allergen Reactions  . Aspirin Other (See Comments)    sweats  . Codeine Other (See Comments)  . Iodine Other (See Comments)    Pt is not aware of this allergy, does not recall much info.  . Prednisone Other (See Comments)    sweats  . Lyrica [Pregabalin]     Wobbly, "more than sleepy"  . Benzonatate Itching and Rash  . Sulfa Antibiotics Other (See Comments)    Doesn't remember    Chief Complaint  Patient presents with  . Acute Visit    C/o- pain inside  (R) ear x 1 week,     HPI: Patient is a 84 y.o. female seen in the office today due to right ear pain.  Started about a week ago. No fever or chills. No drainage. No nasal congestion, runny nose, sore throat.  Reports ear pain will get better and then resolved but then it will come back again.  Notices it when she opens her mouth.  Pain 6/10. Unable to describe the pain.  No pain normally. Just when she chews or opens mouth wide.     Review of Systems:  Review of Systems  Constitutional: Negative for chills, fever, malaise/fatigue and weight loss.  HENT: Positive for ear pain and hearing loss. Negative for congestion, sinus pain and sore throat.   Neurological: Negative for headaches.  Psychiatric/Behavioral: Positive for memory loss.    Past Medical History:  Diagnosis Date  . Anxiety   . Asthma   . Benign essential hypertension   . Bradycardia, drug induced 06/11/2016  . Edema extremities   . GERD (gastroesophageal reflux disease)   . Glaucoma   . Macular degeneration    Per patient   . Osteoarthritis   . PVD (peripheral vascular disease) ([place])   . Seasonal allergies   . Stroke [facility])    Past Surgical History:  Procedure Laterality Date  . ABDOMINAL  HYSTERECTOMY  1981  . TONSILLECTOMY  1953   Social History:   reports that she has never smoked. She has never used smokeless tobacco. She reports that she does not drink alcohol or use drugs.  Family History  Problem Relation Age of Onset  . Stroke Father   . Hypertension Father   . Heart disease Mother   . Hypertension Mother   . Diabetes Mother     Medications: Patient's Medications  New Prescriptions   No medications on file  Previous Medications   ACETAMINOPHEN (TYLENOL) 500 MG TABLET    Take 1 tablet (500 mg total) by mouth every 6 (six) hours as needed for moderate pain.   ALBUTEROL (PROVENTIL HFA;VENTOLIN HFA) 108 (90 BASE) MCG/ACT INHALER    Inhale 2 puffs into the lungs every 6 (six) hours as needed for wheezing.   ALUM HYDROXIDE-MAG TRISILICATE (GAVISCON) 30-07 MG CHEW CHEWABLE TABLET    Chew 1 tablet by mouth 3 (three) times daily as needed for flatulence (bowel spasms).   AMLODIPINE (NORVASC) 5 MG TABLET    Take 1 tablet (5 mg total) by mouth daily.   ATORVASTATIN (LIPITOR) 10 MG TABLET    TAKE 1 TABLET BY MOUTH EVERY DAY   B COMPLEX VITAMINS (B COMPLEX PO)    Take 1 tablet by mouth daily.  BRINZOLAMIDE-BRIMONIDINE (SIMBRINZA) 1-0.2 % SUSP    Apply 1 drop to eye 3 (three) times daily.   CALCIUM CARBONATE-VIT D-MIN (CALCIUM 600+D PLUS MINERALS) 600-400 MG-UNIT TABS    Take by mouth.   CALCIUM CARBONATE-VITAMIN D (CALTRATE 600+D) 600-400 MG-UNIT PER TABLET    Take 2 tablets by mouth daily.   CHOLECALCIFEROL (VITAMIN D3) 2000 UNITS TABS    Take 2,000 Units by mouth daily.   CLOPIDOGREL (PLAVIX) 75 MG TABLET    TAKE 1 TABLET (75 MG TOTAL) BY MOUTH DAILY.   COENZYME Q10 (COQ10) 50 MG CAPS    Take 50 mg by mouth daily.   DOCUSATE SODIUM (COLACE) 100 MG CAPSULE    Take 1 capsule (100 mg total) by mouth daily as needed for mild constipation (to soften stool).   DOXEPIN HCL POWD       FLUTICASONE (FLONASE) 50 MCG/ACT NASAL SPRAY    Place 1 spray into the nose daily.    LORATADINE (CLARITIN) 5 MG CHEWABLE TABLET    Chew 1 tablet (5 mg total) by mouth daily.   LOTEPREDNOL (LOTEMAX) 0.5 % OPHTHALMIC SUSPENSION    Place 1 drop into both eyes daily as needed (for dry eyes).    MULTIPLE VITAMINS-MINERALS (SENIOR MULTIVITAMIN PLUS) TABS    Take 1 tablet by mouth daily.    NONFORMULARY OR COMPOUNDED ITEM    APPLY 1 2 GMS TO AFFECTED AREA 3 4 TIMES DAILY AS NEEDED FOR PAIN  THIS IS A COMPOUNDED CREAM 1 PUMP EQUALS 1 GRAM   NONFORMULARY OR COMPOUNDED ITEM    [facility]:  Peripheral Neuropathy cream - Bupivacaine 1%, Doxepin 3%, Gabapentin 6%, Pentoxifylline 3%, Topiramate 1%, apply 1-2 gramsto affected are 3-4 times daily.   OMEGA-3 FATTY ACIDS (FISH OIL) 1200 MG CPDR    Take 1,200 mg by mouth daily. Take one tablet by mouth once daily    PANTOPRAZOLE (PROTONIX) 40 MG TABLET    Take 1 tablet (40 mg total) by mouth daily.   PILOCARPINE (PILOCAR) 1 % OPHTHALMIC SOLUTION    1 drop into right eye 4 times daily   RESTASIS 0.05 % OPHTHALMIC EMULSION    Place 1 drop into both eyes 2 (two) times daily.    RISPERIDONE (RISPERDAL) 0.5 MG TABLET    TAKE 1 TABLET BY MOUTH TWICE A DAY   SYMBICORT 80-4.5 MCG/ACT INHALER    Place 2 puffs into alternate nostrils 2 (two) times daily.   TRAVOPROST, BAK FREE, (TRAVATAN Z) 0.004 % SOLN OPHTHALMIC SOLUTION    Place 1 drop into both eyes at bedtime.   VITAMIN C (ASCORBIC ACID) 500 MG TABLET    Take 500 mg by mouth daily.  Modified Medications   No medications on file  Discontinued Medications   No medications on file     Physical Exam:  Vitals:   07/31/18 1053  BP: 132/80  Pulse: 85  Resp: 18  Temp: 98 F (36.7 C)  TempSrc: Oral  SpO2: 98%  Weight: 142 lb 6.4 oz (64.6 kg)  Height: 5\' 2"  (1.575 m)   Body mass index is 26.05 kg/m.  Physical Exam  Constitutional: She appears well-developed and well-nourished. No distress.  HENT:  Head: Normocephalic and atraumatic.  Right Ear: Tympanic membrane, external ear and ear  canal normal. No drainage.  Left Ear: Tympanic membrane, external ear and ear canal normal. No drainage.  Nose: Nose normal.  Mouth/Throat: Oropharynx is clear and moist. No oropharyngeal exudate.  Points to TMJ when asked to touch  area that is painful, not having tenderness or reproducible pain today   Eyes:  glasses  Cardiovascular: Normal rate, regular rhythm, normal heart sounds and intact distal pulses.  Pulmonary/Chest: Effort normal and breath sounds normal. No respiratory distress.  Abdominal: Bowel sounds are normal.  Musculoskeletal: Normal range of motion.  Neurological: She is alert.  Skin: Skin is warm and dry. Capillary refill takes less than 2 seconds.  Psychiatric: She has a normal mood and affect.    Labs reviewed: Basic Metabolic Panel: Recent Labs    09/03/17 1117 10/28/17 1210  NA 145* 141  K 3.9 3.9  CL 108* 109  CO2 24 25  GLUCOSE 113* 97  BUN 14 16  CREATININE 0.91 0.85  CALCIUM 9.4 9.6  MG  --  2.1   Liver Function Tests: Recent Labs    09/03/17 1117  AST 21  ALT 17  ALKPHOS 83  BILITOT 0.8  PROT 6.8  ALBUMIN 4.2   No results for input(s): LIPASE, AMYLASE in the last 8760 hours. No results for input(s): AMMONIA in the last 8760 hours. CBC: No results for input(s): WBC, NEUTROABS, HGB, HCT, MCV, PLT in the last 8760 hours. Lipid Panel: No results for input(s): CHOL, HDL, LDLCALC, TRIG, CHOLHDL, LDLDIRECT in the last 8760 hours. TSH: No results for input(s): TSH in the last 8760 hours. A1C: Lab Results  Component Value Date   HGBA1C 5.6 04/22/2016     Assessment/Plan 1. TMJ arthritis Currently pain is not bothersome.  Daughter reports they are eating more raisin brand which is causing them to chew more and could be contributing.  Plans to take a food diary to see if there is any correlation. To Avoid foods that are hard to chew or require lots of chewing Can alternate ice/heat with increase in pain To use tylenol as needed for  pain.  Can take up to tylenol 1000 mg every 8 hours as needed   Next appt: as scheduled with Dr Mariea Clonts in December, sooner if needed  Carlos American. [place], [facility] [phone]

## 2018-08-04 DIAGNOSIS — M25572 Pain in left ankle and joints of left foot: Secondary | ICD-10-CM | POA: Diagnosis not present

## 2018-08-04 DIAGNOSIS — R296 Repeated falls: Secondary | ICD-10-CM | POA: Diagnosis not present

## 2018-08-04 DIAGNOSIS — M25672 Stiffness of left ankle, not elsewhere classified: Secondary | ICD-10-CM | POA: Diagnosis not present

## 2018-08-04 DIAGNOSIS — R269 Unspecified abnormalities of gait and mobility: Secondary | ICD-10-CM | POA: Diagnosis not present

## 2018-08-05 DIAGNOSIS — M25572 Pain in left ankle and joints of left foot: Secondary | ICD-10-CM | POA: Diagnosis not present

## 2018-08-05 DIAGNOSIS — R269 Unspecified abnormalities of gait and mobility: Secondary | ICD-10-CM | POA: Diagnosis not present

## 2018-08-05 DIAGNOSIS — R296 Repeated falls: Secondary | ICD-10-CM | POA: Diagnosis not present

## 2018-08-05 DIAGNOSIS — M25672 Stiffness of left ankle, not elsewhere classified: Secondary | ICD-10-CM | POA: Diagnosis not present

## 2018-08-10 ENCOUNTER — Encounter: Payer: Self-pay | Admitting: Podiatry

## 2018-08-10 NOTE — Progress Notes (Signed)
Subjective:  "My left 3rd toenail may be ingrown. It is hurting me." AKIVA JOSEY presents today with cc of painful, discolored, thick toenails which interfere with daily activities and routine tasks. Most symptomatic is her left 3rd toe which she feels is ingrown at this point. She has not attempted to trim it. Pain is aggravated when wearing enclosed shoe gear. Pain is getting progressively worse.  She has also been followed by Dr. Jacqualyn Posey for plantar fasciitis for which he prescribed a compound pain cream. Ms. Brackin states no pain regarding it on today.  Objective: Vascular Examination: Capillary refill time <3 seconds x 10 digits Dorsalis pedis and posterior tibial pulses present b/l No digital hair x 10 digits Skin temperature warm to warm b/l  Dermatological Examination: Normal turgor, texture and tone b/l Toenails 1-5 b/l discolored, thick, dystrophic with subungual debris and pain with palpation to nailbeds due to thickness of nails. Left 3rd toenail noted to be incurvated with nail border hypertrophy. No drainage, no erythema, no edema. +tenderness to palpation No open wounds No interdigital macerations  Musculoskeletal: Muscle strength 5/5 to all LE muscle groups  Neurological: Sensation intact with 10 gram monofilament. Vibratory sensation intact.  Assessment: Painful onychomycosis toenails 1-5 b/l  Ingrown toenail left 3rd digit, noninfected  Plan: 1. Toenails 1-5 b/l were debrided in length and girth without iatrogenic bleeding. Offending nail border debrided and curretaged left 3rd digit. Cleansed with alcohol. TAO applied. She was instructed to apply Neosporin to digit once daily for a week without bandaid. Ms. Baccam related understanding 2. Patient to continue soft, supportive shoe gear 3. Patient to report any pedal injuries to medical professional immediately. 4. Follow up 3 months. Patient/POA to call should there be a concern in the interim.

## 2018-08-11 DIAGNOSIS — R269 Unspecified abnormalities of gait and mobility: Secondary | ICD-10-CM | POA: Diagnosis not present

## 2018-08-11 DIAGNOSIS — R296 Repeated falls: Secondary | ICD-10-CM | POA: Diagnosis not present

## 2018-08-11 DIAGNOSIS — M25572 Pain in left ankle and joints of left foot: Secondary | ICD-10-CM | POA: Diagnosis not present

## 2018-08-11 DIAGNOSIS — M25672 Stiffness of left ankle, not elsewhere classified: Secondary | ICD-10-CM | POA: Diagnosis not present

## 2018-08-13 DIAGNOSIS — R296 Repeated falls: Secondary | ICD-10-CM | POA: Diagnosis not present

## 2018-08-13 DIAGNOSIS — R269 Unspecified abnormalities of gait and mobility: Secondary | ICD-10-CM | POA: Diagnosis not present

## 2018-08-13 DIAGNOSIS — M25672 Stiffness of left ankle, not elsewhere classified: Secondary | ICD-10-CM | POA: Diagnosis not present

## 2018-08-13 DIAGNOSIS — M25572 Pain in left ankle and joints of left foot: Secondary | ICD-10-CM | POA: Diagnosis not present

## 2018-08-17 DIAGNOSIS — M25672 Stiffness of left ankle, not elsewhere classified: Secondary | ICD-10-CM | POA: Diagnosis not present

## 2018-08-17 DIAGNOSIS — R269 Unspecified abnormalities of gait and mobility: Secondary | ICD-10-CM | POA: Diagnosis not present

## 2018-08-17 DIAGNOSIS — M25572 Pain in left ankle and joints of left foot: Secondary | ICD-10-CM | POA: Diagnosis not present

## 2018-08-17 DIAGNOSIS — R296 Repeated falls: Secondary | ICD-10-CM | POA: Diagnosis not present

## 2018-08-18 DIAGNOSIS — R269 Unspecified abnormalities of gait and mobility: Secondary | ICD-10-CM | POA: Diagnosis not present

## 2018-08-18 DIAGNOSIS — R296 Repeated falls: Secondary | ICD-10-CM | POA: Diagnosis not present

## 2018-08-18 DIAGNOSIS — M25672 Stiffness of left ankle, not elsewhere classified: Secondary | ICD-10-CM | POA: Diagnosis not present

## 2018-08-18 DIAGNOSIS — M25572 Pain in left ankle and joints of left foot: Secondary | ICD-10-CM | POA: Diagnosis not present

## 2018-08-28 DIAGNOSIS — M25672 Stiffness of left ankle, not elsewhere classified: Secondary | ICD-10-CM | POA: Diagnosis not present

## 2018-08-28 DIAGNOSIS — M25572 Pain in left ankle and joints of left foot: Secondary | ICD-10-CM | POA: Diagnosis not present

## 2018-08-28 DIAGNOSIS — R296 Repeated falls: Secondary | ICD-10-CM | POA: Diagnosis not present

## 2018-08-28 DIAGNOSIS — R269 Unspecified abnormalities of gait and mobility: Secondary | ICD-10-CM | POA: Diagnosis not present

## 2018-09-08 DIAGNOSIS — M25672 Stiffness of left ankle, not elsewhere classified: Secondary | ICD-10-CM | POA: Diagnosis not present

## 2018-09-08 DIAGNOSIS — M25572 Pain in left ankle and joints of left foot: Secondary | ICD-10-CM | POA: Diagnosis not present

## 2018-09-08 DIAGNOSIS — R269 Unspecified abnormalities of gait and mobility: Secondary | ICD-10-CM | POA: Diagnosis not present

## 2018-09-08 DIAGNOSIS — R296 Repeated falls: Secondary | ICD-10-CM | POA: Diagnosis not present

## 2018-09-10 DIAGNOSIS — M25672 Stiffness of left ankle, not elsewhere classified: Secondary | ICD-10-CM | POA: Diagnosis not present

## 2018-09-10 DIAGNOSIS — R269 Unspecified abnormalities of gait and mobility: Secondary | ICD-10-CM | POA: Diagnosis not present

## 2018-09-10 DIAGNOSIS — R296 Repeated falls: Secondary | ICD-10-CM | POA: Diagnosis not present

## 2018-09-10 DIAGNOSIS — M25572 Pain in left ankle and joints of left foot: Secondary | ICD-10-CM | POA: Diagnosis not present

## 2018-09-14 ENCOUNTER — Encounter: Payer: Self-pay | Admitting: Internal Medicine

## 2018-09-14 ENCOUNTER — Ambulatory Visit (INDEPENDENT_AMBULATORY_CARE_PROVIDER_SITE_OTHER): Payer: Medicare Other | Admitting: Internal Medicine

## 2018-09-14 VITALS — BP 138/80 | HR 51 | Temp 97.9°F | Ht 62.0 in | Wt 143.0 lb

## 2018-09-14 DIAGNOSIS — M238X2 Other internal derangements of left knee: Secondary | ICD-10-CM | POA: Diagnosis not present

## 2018-09-14 DIAGNOSIS — E785 Hyperlipidemia, unspecified: Secondary | ICD-10-CM | POA: Diagnosis not present

## 2018-09-14 DIAGNOSIS — I872 Venous insufficiency (chronic) (peripheral): Secondary | ICD-10-CM

## 2018-09-14 DIAGNOSIS — H401130 Primary open-angle glaucoma, bilateral, stage unspecified: Secondary | ICD-10-CM

## 2018-09-14 DIAGNOSIS — I10 Essential (primary) hypertension: Secondary | ICD-10-CM

## 2018-09-14 DIAGNOSIS — I6340 Cerebral infarction due to embolism of unspecified cerebral artery: Secondary | ICD-10-CM

## 2018-09-14 DIAGNOSIS — H353120 Nonexudative age-related macular degeneration, left eye, stage unspecified: Secondary | ICD-10-CM | POA: Diagnosis not present

## 2018-09-14 DIAGNOSIS — M238X1 Other internal derangements of right knee: Secondary | ICD-10-CM | POA: Diagnosis not present

## 2018-09-14 NOTE — Progress Notes (Signed)
Location:  [facility] clinic Provider:  Reign Bartnick [name], D.O., C.M.D.  Goals of Care:  Advanced Directives 07/31/2018  Does Patient Have a Medical Advance Directive? Yes  Type of Paramedic of [place];Living will  Does patient want to make changes to medical advance directive? No - Patient declined  Copy of [place] in Chart? No - copy requested  Would patient like information on creating a medical advance directive? -   Chief Complaint  Patient presents with  . Medical Management of Chronic Issues    79mth follow-up, check mole in top of head itching, mole on stomach, bilateral knee weekness    HPI: Patient is a 84 y.o. female seen today for medical management of chronic diseases.    Moles: top of head and one on stomach.  One of top of head came and went in the past but this time came and stayed--on left parietal area of scalp--itches but does not hurt, is dry and scaly.  One on abdomen is just above her pubic area and small nevus--no itching or changes.  We've looked at that one before.  Discussed derm if one on scalp is bothersome enough but she is ok with leaving it alone and monitoring.  Bilateral knee weakness:  Still going on.  Using a cane.  She is hoping there is a solution.  Sometimes they do hurt.  She can't stand on them like she thinks she would.  She does not walk that well.  She is taking PT for this now.  They are some better better since starting.  Her podiatrist ordered the therapy.    Dr. Ricki Miller left.  They are asking who to see for her retinas instead.  Suggested Dr. Coralyn Pear.   No recent difficulty with olfactory hallucinations.  Neighbors that were weird per daughter have also moved away which helped.  They were doing things to their dogs and being a nuisance.  Past Medical History:  Diagnosis Date  . Anxiety   . Asthma   . Benign essential hypertension   . Bradycardia, drug induced 06/11/2016  . Edema extremities   .  GERD (gastroesophageal reflux disease)   . Glaucoma   . Macular degeneration    Per patient   . Osteoarthritis   . PVD (peripheral vascular disease) ([place])   . Seasonal allergies   . Stroke [facility])     Past Surgical History:  Procedure Laterality Date  . ABDOMINAL HYSTERECTOMY  1981  . TONSILLECTOMY  1953    Allergies  Allergen Reactions  . Aspirin Other (See Comments)    sweats  . Codeine Other (See Comments)  . Iodine Other (See Comments)    Pt is not aware of this allergy, does not recall much info.  . Prednisone Other (See Comments)    sweats  . Lyrica [Pregabalin]     Wobbly, "more than sleepy"  . Benzonatate Itching and Rash  . Sulfa Antibiotics Other (See Comments)    Doesn't remember    Outpatient Encounter Medications as of 09/14/2018  Medication Sig  . acetaminophen (TYLENOL) 500 MG tablet Take 500 mg by mouth every 6 (six) hours as needed.  Marland Kitchen albuterol (PROVENTIL HFA;VENTOLIN HFA) 108 (90 Base) MCG/ACT inhaler Inhale 2 puffs into the lungs every 6 (six) hours as needed for wheezing.  Marland Kitchen alum hydroxide-mag trisilicate (GAVISCON) 43-32 MG CHEW chewable tablet Chew by mouth as needed for indigestion or heartburn.  Marland Kitchen amLODipine (NORVASC) 5 MG tablet Take 1 tablet (  5 mg total) by mouth daily.  Marland Kitchen atorvastatin (LIPITOR) 10 MG tablet TAKE 1 TABLET BY MOUTH EVERY DAY  . B Complex Vitamins (B COMPLEX PO) Take 1 tablet by mouth daily.  . Brinzolamide-Brimonidine (SIMBRINZA) 1-0.2 % SUSP Apply 1 drop to eye 3 (three) times daily.  . budesonide-formoterol (SYMBICORT) 80-4.5 MCG/ACT inhaler Inhale 2 puffs into the lungs 2 (two) times daily.  . Calcium Carbonate-Vitamin D (CALTRATE 600+D) 600-400 MG-UNIT per tablet Take 2 tablets by mouth daily.  . Cholecalciferol (VITAMIN D3) 2000 units TABS Take 2,000 Units by mouth daily.  . clopidogrel (PLAVIX) 75 MG tablet TAKE 1 TABLET (75 MG TOTAL) BY MOUTH DAILY.  Marland Kitchen Coenzyme Q10 (COQ10) 50 MG CAPS Take 50 mg by mouth daily.  .  cycloSPORINE (RESTASIS) 0.05 % ophthalmic emulsion Place 1 drop into both eyes 2 (two) times daily.  . fluticasone (FLONASE) 50 MCG/ACT nasal spray Place 1 spray into the nose daily.  Marland Kitchen loratadine (CLARITIN) 5 MG chewable tablet Chew 1 tablet (5 mg total) by mouth daily.  Marland Kitchen loteprednol (LOTEMAX) 0.5 % ophthalmic suspension Place 1 drop into both eyes daily as needed (for dry eyes).   . Multiple Vitamins-Minerals (SENIOR MULTIVITAMIN PLUS) TABS Take 1 tablet by mouth daily.   . NONFORMULARY OR COMPOUNDED ITEM [facility]:  Peripheral Neuropathy cream - Bupivacaine 1%, Doxepin 3%, Gabapentin 6%, Pentoxifylline 3%, Topiramate 1%, apply 1-2 gramsto affected are 3-4 times daily.  . Omega-3 Fatty Acids (FISH OIL) 1200 MG CPDR Take 1,200 mg by mouth daily. Take one tablet by mouth once daily   . pilocarpine (PILOCAR) 1 % ophthalmic solution 1 drop into right eye 4 times daily  . risperiDONE (RISPERDAL) 0.5 MG tablet TAKE 1 TABLET BY MOUTH TWICE A DAY  . Travoprost, BAK Free, (TRAVATAN Z) 0.004 % SOLN ophthalmic solution Place 1 drop into both eyes at bedtime.  . vitamin C (ASCORBIC ACID) 500 MG tablet Take 500 mg by mouth daily.  . [DISCONTINUED] acetaminophen (TYLENOL) 500 MG tablet Take 1 tablet (500 mg total) by mouth every 6 (six) hours as needed for moderate pain.  . [DISCONTINUED] alum hydroxide-mag trisilicate (GAVISCON) 84-16 MG CHEW chewable tablet Chew 1 tablet by mouth 3 (three) times daily as needed for flatulence (bowel spasms).  . [DISCONTINUED] Calcium Carbonate-Vit D-Min (CALCIUM 600+D PLUS MINERALS) 600-400 MG-UNIT TABS Take by mouth.  . [DISCONTINUED] docusate sodium (COLACE) 100 MG capsule Take 1 capsule (100 mg total) by mouth daily as needed for mild constipation (to soften stool).  . [DISCONTINUED] Doxepin HCl POWD   . [DISCONTINUED] NONFORMULARY OR COMPOUNDED ITEM APPLY 1 2 GMS TO AFFECTED AREA 3 4 TIMES DAILY AS NEEDED FOR PAIN  THIS IS A COMPOUNDED CREAM 1 PUMP EQUALS 1 GRAM    . [DISCONTINUED] pantoprazole (PROTONIX) 40 MG tablet Take 1 tablet (40 mg total) by mouth daily.  . [DISCONTINUED] RESTASIS 0.05 % ophthalmic emulsion Place 1 drop into both eyes 2 (two) times daily.   . [DISCONTINUED] SYMBICORT 80-4.5 MCG/ACT inhaler Place 2 puffs into alternate nostrils 2 (two) times daily.   No facility-administered encounter medications on file as of 09/14/2018.     Review of Systems:  Review of Systems  Constitutional: Negative for chills and fever.  HENT: Negative for congestion.   Eyes: Negative for blurred vision.       Macular and glaucoma  Respiratory: Negative for cough and shortness of breath.   Cardiovascular: Negative for chest pain, palpitations and leg swelling.  Edema good with compression hose  Gastrointestinal: Negative for abdominal pain, blood in stool, constipation, diarrhea and melena.  Genitourinary: Negative for dysuria.  Musculoskeletal: Negative for back pain, falls and joint pain.  Skin: Negative for itching and rash.  Neurological: Negative for dizziness and loss of consciousness.  Endo/Heme/Allergies: Does not bruise/bleed easily.  Psychiatric/Behavioral: Positive for memory loss. Negative for depression and hallucinations. The patient is not nervous/anxious and does not have insomnia.     Health Maintenance  Topic Date Due  . MAMMOGRAM  10/07/2018 (Originally 09/28/2009)  . TETANUS/TDAP  10/07/2018 (Originally 10/04/1943)  . INFLUENZA VACCINE  Completed  . DEXA SCAN  Completed  . PNA vac Low Risk Adult  Completed    Physical Exam: Vitals:   09/14/18 1427  BP: 138/80  Pulse: (!) 51  Temp: 97.9 F (36.6 C)  TempSrc: Oral  SpO2: 97%  Weight: 143 lb (64.9 kg)  Height: 5\' 2"  (1.575 m)   Body mass index is 26.16 kg/m. Physical Exam  Constitutional: She is oriented to person, place, and time. She appears well-developed and well-nourished. No distress.  Cardiovascular: Normal rate, regular rhythm, normal heart sounds and  intact distal pulses.  Pulmonary/Chest: Effort normal and breath sounds normal. No respiratory distress.  Abdominal: Bowel sounds are normal.  Musculoskeletal: Normal range of motion.  Neurological: She is alert and oriented to person, place, and time.  Skin: Skin is warm and dry.  Scaly keratosis on left parietal area and small nevus just above pubic area on right lower abdomen  Psychiatric: She has a normal mood and affect.    Labs reviewed: Basic Metabolic Panel: Recent Labs    10/28/17 1210  NA 141  K 3.9  CL 109  CO2 25  GLUCOSE 97  BUN 16  CREATININE 0.85  CALCIUM 9.6  MG 2.1   Liver Function Tests: No results for input(s): AST, ALT, ALKPHOS, BILITOT, PROT, ALBUMIN in the last 8760 hours. No results for input(s): LIPASE, AMYLASE in the last 8760 hours. No results for input(s): AMMONIA in the last 8760 hours. CBC: No results for input(s): WBC, NEUTROABS, HGB, HCT, MCV, PLT in the last 8760 hours. Lipid Panel: No results for input(s): CHOL, HDL, LDLCALC, TRIG, CHOLHDL, LDLDIRECT in the last 8760 hours. Lab Results  Component Value Date   HGBA1C 5.6 04/22/2016    Assessment/Plan 1. Joint laxity of left knee 2. Joint laxity of right knee Seeing PT which is helping balance and strength in legs  3. Essential hypertension - bp at goal without dizziness - CBC with Differential/Platelet - COMPLETE METABOLIC PANEL WITH GFR  4. Primary open angle glaucoma of both eyes, unspecified glaucoma stage -on two new eye drops, pressure said to be stable  5. Venous insufficiency of both lower extremities -cont compression hose  6. Hyperlipidemia, unspecified hyperlipidemia type -cont lipitor therapy - Lipid panel  7. Nonexudative age-related macular degeneration of left eye, unspecified stage - needs new retina specialist - Ambulatory referral to Ophthalmology  Labs/tests ordered:   Orders Placed This Encounter  Procedures  . CBC with Differential/Platelet  .  COMPLETE METABOLIC PANEL WITH GFR  . Lipid panel    Order Specific Question:   Has the patient fasted?    Answer:   No  . Ambulatory referral to Ophthalmology    Referral Priority:   Routine    Referral Type:   Consultation    Referral Reason:   Specialty Services Required    Referred to Provider:   Bernarda Caffey,  MD    Requested Specialty:   Ophthalmology    Number of Visits Requested:   1    Next appt:  01/21/2019 med [place]. Tyriek Hofman, D.O. [facility] [address], [place] [address] Cell Phone (Mon-Fri 8am-5pm):  [phone] On Call:  [phone] & follow prompts after 5pm & weekends Office Phone:  [phone] Office Fax:  [phone]

## 2018-09-15 DIAGNOSIS — R296 Repeated falls: Secondary | ICD-10-CM | POA: Diagnosis not present

## 2018-09-15 DIAGNOSIS — R269 Unspecified abnormalities of gait and mobility: Secondary | ICD-10-CM | POA: Diagnosis not present

## 2018-09-15 DIAGNOSIS — M25572 Pain in left ankle and joints of left foot: Secondary | ICD-10-CM | POA: Diagnosis not present

## 2018-09-15 DIAGNOSIS — M25672 Stiffness of left ankle, not elsewhere classified: Secondary | ICD-10-CM | POA: Diagnosis not present

## 2018-09-15 LAB — CBC WITH DIFFERENTIAL/PLATELET
Basophils Absolute: 42 cells/uL (ref 0–200)
Basophils Relative: 0.7 %
Eosinophils Absolute: 108 cells/uL (ref 15–500)
Eosinophils Relative: 1.8 %
HCT: 36.4 % (ref 35.0–45.0)
Hemoglobin: 12.2 g/dL (ref 11.7–15.5)
Lymphs Abs: 1854 cells/uL (ref 850–3900)
MCH: 31.6 pg (ref 27.0–33.0)
MCHC: 33.5 g/dL (ref 32.0–36.0)
MCV: 94.3 fL (ref 80.0–100.0)
MPV: 10.7 fL (ref 7.5–12.5)
Monocytes Relative: 10.2 %
Neutro Abs: 3384 cells/uL (ref 1500–7800)
Neutrophils Relative %: 56.4 %
Platelets: 213 10*3/uL (ref 140–400)
RBC: 3.86 10*6/uL (ref 3.80–5.10)
RDW: 13 % (ref 11.0–15.0)
Total Lymphocyte: 30.9 %
WBC mixed population: 612 cells/uL (ref 200–950)
WBC: 6 10*3/uL (ref 3.8–10.8)

## 2018-09-15 LAB — COMPLETE METABOLIC PANEL WITH GFR
AG Ratio: 1.4 (calc) (ref 1.0–2.5)
ALT: 17 U/L (ref 6–29)
AST: 23 U/L (ref 10–35)
Albumin: 4 g/dL (ref 3.6–5.1)
Alkaline phosphatase (APISO): 79 U/L (ref 33–130)
BUN/Creatinine Ratio: 19 (calc) (ref 6–22)
BUN: 18 mg/dL (ref 7–25)
CO2: 27 mmol/L (ref 20–32)
Calcium: 9.7 mg/dL (ref 8.6–10.4)
Chloride: 107 mmol/L (ref 98–110)
Creat: 0.97 mg/dL — ABNORMAL HIGH (ref 0.60–0.88)
GFR, Est African American: 59 mL/min/{1.73_m2} — ABNORMAL LOW (ref >=60)
GFR, Est Non African American: 51 mL/min/{1.73_m2} — ABNORMAL LOW (ref >=60)
Globulin: 2.8 g/dL (calc) (ref 1.9–3.7)
Glucose, Bld: 92 mg/dL (ref 65–99)
Potassium: 4.1 mmol/L (ref 3.5–5.3)
Sodium: 142 mmol/L (ref 135–146)
Total Bilirubin: 0.8 mg/dL (ref 0.2–1.2)
Total Protein: 6.8 g/dL (ref 6.1–8.1)

## 2018-09-15 LAB — LIPID PANEL
Cholesterol: 144 mg/dL (ref <200)
HDL: 75 mg/dL (ref >50)
LDL Cholesterol (Calc): 55 mg/dL (calc)
Non-HDL Cholesterol (Calc): 69 mg/dL (calc) (ref <130)
Total CHOL/HDL Ratio: 1.9 (calc) (ref <5.0)
Triglycerides: 61 mg/dL (ref <150)

## 2018-09-21 ENCOUNTER — Telehealth: Payer: Self-pay | Admitting: Podiatry

## 2018-09-21 DIAGNOSIS — R296 Repeated falls: Secondary | ICD-10-CM | POA: Diagnosis not present

## 2018-09-21 DIAGNOSIS — M25672 Stiffness of left ankle, not elsewhere classified: Secondary | ICD-10-CM | POA: Diagnosis not present

## 2018-09-21 DIAGNOSIS — R269 Unspecified abnormalities of gait and mobility: Secondary | ICD-10-CM | POA: Diagnosis not present

## 2018-09-21 DIAGNOSIS — M25572 Pain in left ankle and joints of left foot: Secondary | ICD-10-CM | POA: Diagnosis not present

## 2018-09-21 MED ORDER — NONFORMULARY OR COMPOUNDED ITEM: 5 refills | Status: DC

## 2018-09-21 NOTE — Telephone Encounter (Signed)
Left message informing pt's dtr, Katharine Look our office had changed compound pharmacy to [facility] [phone], and they would contact her with the delivery and insurance information. Faxed orders to [facility].

## 2018-09-21 NOTE — Telephone Encounter (Signed)
Pts daughter called to see if there is another compound that can be prescribed for the patient because the one she is currently using just doubled in cost. Please give pts daughter a call back.

## 2018-09-21 NOTE — Addendum Note (Signed)
Addended by: Harriett Sine D on: 09/21/2018 11:13 AM   Modules accepted: Orders

## 2018-09-22 ENCOUNTER — Telehealth: Payer: Self-pay | Admitting: Podiatry

## 2018-09-22 NOTE — Telephone Encounter (Signed)
Pt's dtr, Katharine Look states they have a family friend that take pt, and they have been going since 06/2018. I told Katharine Look PT was 3x week for 4 weeks, so 12 PT visits for gait training and strengthening. Katharine Look states they will see if pt has met the 12 visit requirement. I told Katharine Look if pt needs more visit Dr. Jacqualyn Posey can evaluate at her next appt. Katharine Look states understanding.

## 2018-09-22 NOTE — Telephone Encounter (Signed)
Pts daughter called wanting some clarification on the physical therapy expectations for pt. How many visits and for how many weeks? Please give daughter a call back.

## 2018-09-23 DIAGNOSIS — M25672 Stiffness of left ankle, not elsewhere classified: Secondary | ICD-10-CM | POA: Diagnosis not present

## 2018-09-23 DIAGNOSIS — R296 Repeated falls: Secondary | ICD-10-CM | POA: Diagnosis not present

## 2018-09-23 DIAGNOSIS — R269 Unspecified abnormalities of gait and mobility: Secondary | ICD-10-CM | POA: Diagnosis not present

## 2018-09-23 DIAGNOSIS — M25572 Pain in left ankle and joints of left foot: Secondary | ICD-10-CM | POA: Diagnosis not present

## 2018-09-28 DIAGNOSIS — H401113 Primary open-angle glaucoma, right eye, severe stage: Secondary | ICD-10-CM | POA: Diagnosis not present

## 2018-09-28 DIAGNOSIS — H353132 Nonexudative age-related macular degeneration, bilateral, intermediate dry stage: Secondary | ICD-10-CM | POA: Diagnosis not present

## 2018-10-01 DIAGNOSIS — R296 Repeated falls: Secondary | ICD-10-CM | POA: Diagnosis not present

## 2018-10-01 DIAGNOSIS — M25672 Stiffness of left ankle, not elsewhere classified: Secondary | ICD-10-CM | POA: Diagnosis not present

## 2018-10-01 DIAGNOSIS — R269 Unspecified abnormalities of gait and mobility: Secondary | ICD-10-CM | POA: Diagnosis not present

## 2018-10-01 DIAGNOSIS — M25572 Pain in left ankle and joints of left foot: Secondary | ICD-10-CM | POA: Diagnosis not present

## 2018-10-02 DIAGNOSIS — R296 Repeated falls: Secondary | ICD-10-CM | POA: Diagnosis not present

## 2018-10-02 DIAGNOSIS — M25572 Pain in left ankle and joints of left foot: Secondary | ICD-10-CM | POA: Diagnosis not present

## 2018-10-02 DIAGNOSIS — M25672 Stiffness of left ankle, not elsewhere classified: Secondary | ICD-10-CM | POA: Diagnosis not present

## 2018-10-02 DIAGNOSIS — R269 Unspecified abnormalities of gait and mobility: Secondary | ICD-10-CM | POA: Diagnosis not present

## 2018-10-02 DIAGNOSIS — H401134 Primary open-angle glaucoma, bilateral, indeterminate stage: Secondary | ICD-10-CM | POA: Diagnosis not present

## 2018-10-06 DIAGNOSIS — M25672 Stiffness of left ankle, not elsewhere classified: Secondary | ICD-10-CM | POA: Diagnosis not present

## 2018-10-06 DIAGNOSIS — R269 Unspecified abnormalities of gait and mobility: Secondary | ICD-10-CM | POA: Diagnosis not present

## 2018-10-06 DIAGNOSIS — M25572 Pain in left ankle and joints of left foot: Secondary | ICD-10-CM | POA: Diagnosis not present

## 2018-10-06 DIAGNOSIS — R296 Repeated falls: Secondary | ICD-10-CM | POA: Diagnosis not present

## 2018-10-09 DIAGNOSIS — R269 Unspecified abnormalities of gait and mobility: Secondary | ICD-10-CM | POA: Diagnosis not present

## 2018-10-09 DIAGNOSIS — R296 Repeated falls: Secondary | ICD-10-CM | POA: Diagnosis not present

## 2018-10-09 DIAGNOSIS — M25572 Pain in left ankle and joints of left foot: Secondary | ICD-10-CM | POA: Diagnosis not present

## 2018-10-09 DIAGNOSIS — M25672 Stiffness of left ankle, not elsewhere classified: Secondary | ICD-10-CM | POA: Diagnosis not present

## 2018-10-19 ENCOUNTER — Ambulatory Visit (INDEPENDENT_AMBULATORY_CARE_PROVIDER_SITE_OTHER): Payer: Medicare Other | Admitting: Internal Medicine

## 2018-10-19 ENCOUNTER — Encounter: Payer: Self-pay | Admitting: Internal Medicine

## 2018-10-19 VITALS — BP 132/70 | HR 54 | Temp 97.8°F | Ht 62.0 in | Wt 143.0 lb

## 2018-10-19 DIAGNOSIS — R1012 Left upper quadrant pain: Secondary | ICD-10-CM | POA: Diagnosis not present

## 2018-10-19 DIAGNOSIS — N644 Mastodynia: Secondary | ICD-10-CM

## 2018-10-19 DIAGNOSIS — H401134 Primary open-angle glaucoma, bilateral, indeterminate stage: Secondary | ICD-10-CM | POA: Diagnosis not present

## 2018-10-19 NOTE — Patient Instructions (Addendum)
We'll get a mammogram done to check your breasts.    For your belly discomfort, try taking gaviscon when you have the fullness on your left side.  It's ok to also continue to take it first thing in the morning.    If it continues to bother you, let me know.

## 2018-10-19 NOTE — Progress Notes (Signed)
Location:  [facility] clinic Provider: Crandall Harvel [name], D.O., C.M.D.  Code Status: DNR Goals of Care:  Advanced Directives 07/31/2018  Does Patient Have a Medical Advance Directive? Yes  Type of Paramedic of [place];Living will  Does patient want to make changes to medical advance directive? No - Patient declined  Copy of [place] in Chart? No - copy requested  Would patient like information on creating a medical advance directive? -     Chief Complaint  Patient presents with  . Acute Visit    pain in left side x72mths    HPI: Patient is a 85 y.o. female seen today for an acute visit for Pain in her left upper quadrant--feels pain after eating sometimes.  Started in the fall a little when she'd eat certain things.  May happen with her regular dinner, apples, nuts, not bread.  It doesn't last long--will get to the spot and rest there.  Happens for sure after a peanut butter sandwich.  Does not seem to happen after breakfast.  Her dinner is a much heavier meal.  She says she'd call it a misery.  Does not feel like gas.  It will go over to her navel.   The evening meal is two veggies and a meat.  No changes in bowel habits.  Takes gaviscon in the mornings.    Says something started in her breast in November.  Not mentioned at visit 12/9.  It moved from one place to another in her left breast.  One time, she felt under her arm, it was a little sore.   Weight is stable at 143 for at least a year give or take a lb or two.  They also ask about the spot on her scalp again.    Past Medical History:  Diagnosis Date  . Anxiety   . Asthma   . Benign essential hypertension   . Bradycardia, drug induced 06/11/2016  . Edema extremities   . GERD (gastroesophageal reflux disease)   . Glaucoma   . Macular degeneration    Per patient   . Osteoarthritis   . PVD (peripheral vascular disease) ([place])   . Seasonal allergies   . Stroke [facility])     Past  Surgical History:  Procedure Laterality Date  . ABDOMINAL HYSTERECTOMY  1981  . TONSILLECTOMY  1953    Allergies  Allergen Reactions  . Aspirin Other (See Comments)    sweats  . Codeine Other (See Comments)  . Iodine Other (See Comments)    Pt is not aware of this allergy, does not recall much info.  . Prednisone Other (See Comments)    sweats  . Lyrica [Pregabalin]     Wobbly, "more than sleepy"  . Benzonatate Itching and Rash  . Sulfa Antibiotics Other (See Comments)    Doesn't remember    Outpatient Encounter Medications as of 10/19/2018  Medication Sig  . acetaminophen (TYLENOL) 500 MG tablet Take 500 mg by mouth every 6 (six) hours as needed.  Marland Kitchen albuterol (PROVENTIL HFA;VENTOLIN HFA) 108 (90 Base) MCG/ACT inhaler Inhale 2 puffs into the lungs every 6 (six) hours as needed for wheezing.  Marland Kitchen alum hydroxide-mag trisilicate (GAVISCON) 67-12 MG CHEW chewable tablet Chew by mouth as needed for indigestion or heartburn.  Marland Kitchen amLODipine (NORVASC) 5 MG tablet Take 1 tablet (5 mg total) by mouth daily.  Marland Kitchen atorvastatin (LIPITOR) 10 MG tablet TAKE 1 TABLET BY MOUTH EVERY DAY  . B Complex Vitamins (  B COMPLEX PO) Take 1 tablet by mouth daily.  . Brinzolamide-Brimonidine (SIMBRINZA) 1-0.2 % SUSP Apply 1 drop to eye 3 (three) times daily.  . budesonide-formoterol (SYMBICORT) 80-4.5 MCG/ACT inhaler Inhale 2 puffs into the lungs 2 (two) times daily.  . Calcium Carbonate-Vitamin D (CALTRATE 600+D) 600-400 MG-UNIT per tablet Take 2 tablets by mouth daily.  . Cholecalciferol (VITAMIN D3) 2000 units TABS Take 2,000 Units by mouth daily.  . clopidogrel (PLAVIX) 75 MG tablet TAKE 1 TABLET (75 MG TOTAL) BY MOUTH DAILY.  Marland Kitchen Coenzyme Q10 (COQ10) 50 MG CAPS Take 50 mg by mouth daily.  . cycloSPORINE (RESTASIS) 0.05 % ophthalmic emulsion Place 1 drop into both eyes 2 (two) times daily.  . fluticasone (FLONASE) 50 MCG/ACT nasal spray Place 1 spray into the nose daily.  Marland Kitchen loratadine (CLARITIN) 5 MG chewable  tablet Chew 1 tablet (5 mg total) by mouth daily.  Marland Kitchen loteprednol (LOTEMAX) 0.5 % ophthalmic suspension Place 1 drop into both eyes daily as needed (for dry eyes).   . Multiple Vitamins-Minerals (SENIOR MULTIVITAMIN PLUS) TABS Take 1 tablet by mouth daily.   . NONFORMULARY OR COMPOUNDED ITEM [facility]:  Peripheral Neuropathy Cream - Bupivacaine 1%, Doxepin 3%, Gabapentin 6%, Pentoxifylline 3%, Topiramate 1%, apply 1-2 grams to affected areas 3-4 times daily prn.  . Omega-3 Fatty Acids (FISH OIL) 1200 MG CPDR Take 1,200 mg by mouth daily. Take one tablet by mouth once daily   . pilocarpine (PILOCAR) 1 % ophthalmic solution 1 drop into right eye 4 times daily  . risperiDONE (RISPERDAL) 0.5 MG tablet TAKE 1 TABLET BY MOUTH TWICE A DAY  . Travoprost, BAK Free, (TRAVATAN Z) 0.004 % SOLN ophthalmic solution Place 1 drop into both eyes at bedtime.  . vitamin C (ASCORBIC ACID) 500 MG tablet Take 500 mg by mouth daily.  . [DISCONTINUED] NONFORMULARY OR COMPOUNDED ITEM [facility]:  Peripheral Neuropathy cream - Bupivacaine 1%, Doxepin 3%, Gabapentin 6%, Pentoxifylline 3%, Topiramate 1%, apply 1-2 gramsto affected are 3-4 times daily.   No facility-administered encounter medications on file as of 10/19/2018.     Review of Systems:  Review of Systems  Constitutional: Negative for chills, fever, malaise/fatigue and weight loss.  HENT: Negative for tinnitus.   Eyes: Positive for blurred vision.  Respiratory: Negative for cough and shortness of breath.   Cardiovascular: Negative for chest pain, palpitations and leg swelling.  Gastrointestinal: Positive for abdominal pain and heartburn. Negative for blood in stool, constipation, diarrhea, melena, nausea and vomiting.  Genitourinary: Negative for dysuria.  Musculoskeletal: Positive for joint pain. Negative for myalgias.  Skin: Negative for itching and rash.       seb keratosis on anterior scalp near frontal hairline still present--currently  with scaly area on it  Neurological: Negative for dizziness and loss of consciousness.  Psychiatric/Behavioral: Positive for memory loss. Negative for depression. The patient is not nervous/anxious and does not have insomnia.     Health Maintenance  Topic Date Due  . TETANUS/TDAP  10/04/1943  . MAMMOGRAM  09/28/2009  . INFLUENZA VACCINE  Completed  . DEXA SCAN  Completed  . PNA vac Low Risk Adult  Completed    Physical Exam: Vitals:   10/19/18 1145  BP: 132/70  Pulse: (!) 54  Temp: 97.8 F (36.6 C)  TempSrc: Oral  SpO2: 98%  Weight: 143 lb (64.9 kg)  Height: 5\' 2"  (1.575 m)   Body mass index is 26.16 kg/m. Physical Exam Vitals signs reviewed.  Constitutional:  Appearance: Normal appearance. She is normal weight.  HENT:     Head: Normocephalic and atraumatic.  Eyes:     Comments: glasses  Cardiovascular:     Rate and Rhythm: Normal rate.     Pulses: Normal pulses.     Heart sounds: Normal heart sounds.  Pulmonary:     Effort: Pulmonary effort is normal.     Breath sounds: Normal breath sounds.  Chest:     Breasts: Breasts are symmetrical.        Right: Tenderness present. No swelling, bleeding, inverted nipple, mass, nipple discharge or skin change.        Left: Tenderness present. No swelling, bleeding, inverted nipple, mass, nipple discharge or skin change.  Abdominal:     General: Bowel sounds are normal. There is no distension.     Palpations: Abdomen is soft. There is no mass.     Tenderness: There is no abdominal tenderness. There is no guarding or rebound.  Musculoskeletal: Normal range of motion.  Lymphadenopathy:     Upper Body:     Right upper body: No supraclavicular, axillary or pectoral adenopathy.     Left upper body: No supraclavicular, axillary or pectoral adenopathy.  Skin:    General: Skin is warm and dry.  Neurological:     General: No focal deficit present.     Mental Status: She is alert and oriented to person, place, and time.      Comments: Some short term memory loss, her daughter fills in a bit of information/clarifies things  Psychiatric:        Mood and Affect: Mood normal.     Labs reviewed: Basic Metabolic Panel: Recent Labs    10/28/17 1210 09/14/18 1547  NA 141 142  K 3.9 4.1  CL 109 107  CO2 25 27  GLUCOSE 97 92  BUN 16 18  CREATININE 0.85 0.97*  CALCIUM 9.6 9.7  MG 2.1  --    Liver Function Tests: Recent Labs    09/14/18 1547  AST 23  ALT 17  BILITOT 0.8  PROT 6.8   No results for input(s): LIPASE, AMYLASE in the last 8760 hours. No results for input(s): AMMONIA in the last 8760 hours. CBC: Recent Labs    09/14/18 1547  WBC 6.0  NEUTROABS 3,384  HGB 12.2  HCT 36.4  MCV 94.3  PLT 213   Lipid Panel: Recent Labs    09/14/18 1547  CHOL 144  HDL 75  LDLCALC 55  TRIG 61  CHOLHDL 1.9   Lab Results  Component Value Date   HGBA1C 5.6 04/22/2016     Assessment/Plan 1. Breast tenderness in female -new onset, pt not a perfect historian and she feels abnormalities in her breasts, will get mammogram to ensure we are not missing something--will need to address if concerns are found, how aggressively she wants to be treated--we still do not have any ACP docs on file for her - MM Digital Diagnostic Bilat; Future  2. Left upper quadrant pain -suspect this is flatus related to her GERD since it comes on only with certain foods -advised to try to avoid nuts and seeds she notices a correlation with -use the gaviscon when needed not just in the am as she's been doing (but try to limit to bid)  Labs/tests ordered:  Orders Placed This Encounter  Procedures  . MM Digital Diagnostic Bilat    Standing Status:   Future    Standing Expiration Date:   12/18/2019  Order Specific Question:   Reason for Exam (SYMPTOM  OR DIAGNOSIS REQUIRED)    Answer:   bilateral breast tenderness, left breast fullness    Order Specific Question:   Preferred imaging location?    Answer:   [facility]    Next appt:  01/21/2019  Saga Balthazar L. Jeaneane Adamec, D.O. [facility] [address], [place] [address] Cell Phone (Mon-Fri 8am-5pm):  [phone] On Call:  [phone] & follow prompts after 5pm & weekends Office Phone:  [phone] Office Fax:  [phone]

## 2018-10-27 ENCOUNTER — Other Ambulatory Visit: Payer: Self-pay | Admitting: Internal Medicine

## 2018-10-27 DIAGNOSIS — N644 Mastodynia: Secondary | ICD-10-CM

## 2018-10-29 ENCOUNTER — Ambulatory Visit: Payer: Medicare Other | Admitting: Podiatry

## 2018-11-12 ENCOUNTER — Other Ambulatory Visit: Payer: Self-pay | Admitting: Internal Medicine

## 2018-11-12 ENCOUNTER — Telehealth: Payer: Self-pay | Admitting: *Deleted

## 2018-11-12 DIAGNOSIS — R1012 Left upper quadrant pain: Secondary | ICD-10-CM

## 2018-11-12 MED ORDER — PANTOPRAZOLE SODIUM 40 MG PO TBEC: 40.0000 mg | DELAYED_RELEASE_TABLET | Freq: Two times a day (BID) | ORAL | 3 refills | Status: DC

## 2018-11-12 NOTE — Telephone Encounter (Signed)
Patient Caregiver, Katharine Look called and stated that patient is still having belly discomfort around the navel area. Stated that it seems to happen when she eats. Stated that the Gaviscon is not helping. Katharine Look is wondering if patient should be taking a reflux medication for this. Please Advise.

## 2018-11-12 NOTE — Telephone Encounter (Signed)
Gayland Curry, DO at 11/12/2018 11:58 AM   Status: Signed    I sent some protonix to take twice a day before meals to [facility].  She cannot take any of the other similar medications because they will interfere with the effectiveness of her plavix.        Ivin Booty, Caregiver notified and agreed.   She wants to know if the patient should also continue taking the Gaviscon. Please Advise.

## 2018-11-12 NOTE — Telephone Encounter (Signed)
I sent in protonix twice a day before meals.  See orders only encounter also.

## 2018-11-12 NOTE — Progress Notes (Signed)
I sent some protonix to take twice a day before meals to [facility].  She cannot take any of the other similar medications because they will interfere with the effectiveness of her plavix.

## 2018-11-18 ENCOUNTER — Ambulatory Visit
Admission: RE | Admit: 2018-11-18 | Discharge: 2018-11-18 | Disposition: A | Discharge status: Home / Self Care | Payer: Medicare Other | Source: Ambulatory Visit | Attending: Internal Medicine | Admitting: Internal Medicine

## 2018-11-18 ENCOUNTER — Ambulatory Visit: Payer: Medicare Other

## 2018-11-18 DIAGNOSIS — R928 Other abnormal and inconclusive findings on diagnostic imaging of breast: Secondary | ICD-10-CM | POA: Diagnosis not present

## 2018-11-18 DIAGNOSIS — N644 Mastodynia: Secondary | ICD-10-CM

## 2018-11-19 ENCOUNTER — Telehealth: Payer: Self-pay | Admitting: *Deleted

## 2018-11-19 ENCOUNTER — Encounter: Payer: Self-pay | Admitting: *Deleted

## 2018-11-19 DIAGNOSIS — J4540 Moderate persistent asthma, uncomplicated: Secondary | ICD-10-CM | POA: Diagnosis not present

## 2018-11-19 DIAGNOSIS — J300 Vasomotor rhinitis: Secondary | ICD-10-CM | POA: Diagnosis not present

## 2018-11-19 DIAGNOSIS — J3089 Other allergic rhinitis: Secondary | ICD-10-CM | POA: Diagnosis not present

## 2018-11-19 NOTE — Telephone Encounter (Signed)
I believe they can just call and make an appt with Dr. Radford Pax if she is having ongoing chest pain symptoms.  I didn't think her symptoms seemed cardiac at her visit with me.

## 2018-11-19 NOTE — Telephone Encounter (Signed)
LMOM regarding Dr. Cyndi Lennert message

## 2018-11-19 NOTE — Telephone Encounter (Signed)
Arley Phenix called and wanted [name] to review Mammogram. I informed her that [name] did and it was Negative. Katharine Look stated that they told them there that patient [name] [name] need to follow up with Cardiology for her symptoms. She stated that she thought they would have put that in the report. She is wondering if you need to place a referral for her to see her heart Dr. Lenon Ahmadi that she sees Dr. Radford Pax. Please Advise.

## 2018-11-20 ENCOUNTER — Telehealth: Payer: Self-pay | Admitting: Cardiology

## 2018-11-20 NOTE — Telephone Encounter (Signed)
Spoke with the patient, she stated that she has had pain that occurs in her left breast, under her arm, stomach, shoulder and elbow. The pain is sudden, "like a prick" and only lasts a few minutes. It occurs around 2 times a day. The pain randomly occurs in one of the listed places, but occurs mostly in the left breast. The pain started in November. She denies any lifestyle or medication changes. I advised the patient to go to the hospital if her pain becomes constant or worsens significantly. She expressed understanding and is coming to the office on Tuesday. She had no further questions.

## 2018-11-20 NOTE — Telephone Encounter (Signed)
New Message:     Daughter is calling to say pt have been having pain,in her chest,under her arm, stomach,shoulder and elbow. When she saw her primary doctor  In January, she was told that she might want to follow up with her Cardiologist if she continue to have these pains. She is still having them. I made pt an appt for Tuesday with Ellen Henri, please call to evaluate.

## 2018-11-23 ENCOUNTER — Encounter: Payer: Self-pay | Admitting: Cardiology

## 2018-11-23 DIAGNOSIS — H401134 Primary open-angle glaucoma, bilateral, indeterminate stage: Secondary | ICD-10-CM | POA: Diagnosis not present

## 2018-11-24 ENCOUNTER — Encounter: Payer: Self-pay | Admitting: Cardiology

## 2018-11-24 ENCOUNTER — Ambulatory Visit (INDEPENDENT_AMBULATORY_CARE_PROVIDER_SITE_OTHER): Payer: Medicare Other | Admitting: Podiatry

## 2018-11-24 ENCOUNTER — Encounter

## 2018-11-24 ENCOUNTER — Ambulatory Visit (INDEPENDENT_AMBULATORY_CARE_PROVIDER_SITE_OTHER): Payer: Medicare Other | Admitting: Cardiology

## 2018-11-24 ENCOUNTER — Other Ambulatory Visit: Payer: Self-pay | Admitting: Internal Medicine

## 2018-11-24 VITALS — BP 150/80 | HR 68 | Ht 62.0 in | Wt 140.6 lb

## 2018-11-24 DIAGNOSIS — M79674 Pain in right toe(s): Secondary | ICD-10-CM

## 2018-11-24 DIAGNOSIS — L84 Corns and callosities: Secondary | ICD-10-CM

## 2018-11-24 DIAGNOSIS — G5793 Unspecified mononeuropathy of bilateral lower limbs: Secondary | ICD-10-CM

## 2018-11-24 DIAGNOSIS — Z9229 Personal history of other drug therapy: Secondary | ICD-10-CM | POA: Diagnosis not present

## 2018-11-24 DIAGNOSIS — R001 Bradycardia, unspecified: Secondary | ICD-10-CM

## 2018-11-24 DIAGNOSIS — T50905A Adverse effect of unspecified drugs, medicaments and biological substances, initial encounter: Secondary | ICD-10-CM | POA: Diagnosis not present

## 2018-11-24 DIAGNOSIS — M79675 Pain in left toe(s): Secondary | ICD-10-CM

## 2018-11-24 DIAGNOSIS — B351 Tinea unguium: Secondary | ICD-10-CM

## 2018-11-24 DIAGNOSIS — I10 Essential (primary) hypertension: Secondary | ICD-10-CM

## 2018-11-24 NOTE — Progress Notes (Signed)
11/24/2018 [name]   [birth year]  [id number]  Primary Physician [name] Curry, DO Primary Cardiologist: [name] Him, MD  Electrophysiologist: None   Reason for Visit/CC: Chest Pain   HPI:  [name] [name] is a 85 y.o. female who is being seen today for the evaluation of chest pain. She has no known h/o CAD but is followed by [name] [name] for HTN. She also has a h/o bradycardia, asthma and chronic LEE.   She presents to clinic today with a complaint of atypical chest discomfort.  She is here with her daughter.  She lives home with family but she is able to move around with occasional use of a cane and is fairly independent.  She reports on and off again chest discomfort for several months now, since November 2019.  Chest pain is atypical.  She notes pain in the left upper chest and in the left breast with occasional pain in the left forearm and left axillary area.  This pain is typically sharp shooting pain lasting a few minutes at a time.  There are no exacerbating or alleviating factors.  It is not worse with movement nor exertion.  She does however have some reproducible pain with palpation of the left upper chest wall.  She has tried some Tylenol at home which results in mild improvement but the pain typically returns.  She saw her PCP for this and they referred her for a mammogram given the left breast pain but she was told mammogram was normal.  She was advised to follow-up follow-up with cardiology.  In addition to this pain, she also notes some recent indigestion symptoms that occur after eating.  She also thinks that she may have some lactose intolerance.  She has been drinking products with milk recently that has upset her stomach.  Her PCP started her on Protonix 40 mg twice daily.  She did start this a few weeks ago and has noted some improvement thus far.  She is currently pain-free in clinic today. Again she does have some reproducible pain with palpation of the left upper chest  wall.  EKG shows chronic right bundle branch block.  No significant changes from prior EKGs.  Current Meds  Medication Sig  . acetaminophen (TYLENOL) 500 MG tablet Take 500 mg by mouth every 6 (six) hours as needed.  Marland Kitchen albuterol (PROVENTIL HFA;VENTOLIN HFA) 108 (90 Base) MCG/ACT inhaler Inhale 2 puffs into the lungs every 6 (six) hours as needed for wheezing.  Marland Kitchen alum hydroxide-mag trisilicate (GAVISCON) 95-18 MG CHEW chewable tablet Chew by mouth as needed for indigestion or heartburn.  Marland Kitchen amLODipine (NORVASC) 5 MG tablet Take 1 tablet (5 mg total) by mouth daily.  Marland Kitchen atorvastatin (LIPITOR) 10 MG tablet TAKE 1 TABLET BY MOUTH EVERY DAY  . B Complex Vitamins (B COMPLEX PO) Take 1 tablet by mouth daily.  . Brinzolamide-Brimonidine (SIMBRINZA) 1-0.2 % SUSP Apply 1 drop to eye 3 (three) times daily.  . budesonide-formoterol (SYMBICORT) 80-4.5 MCG/ACT inhaler Inhale 2 puffs into the lungs 2 (two) times daily.  . Calcium Carbonate-Vitamin D (CALTRATE 600+D) 600-400 MG-UNIT per tablet Take 2 tablets by mouth daily.  . Cholecalciferol (VITAMIN D3) 2000 units TABS Take 2,000 Units by mouth daily.  . clopidogrel (PLAVIX) 75 MG tablet TAKE 1 TABLET (75 MG TOTAL) BY MOUTH DAILY.  Marland Kitchen Coenzyme Q10 (COQ10) 50 MG CAPS Take 50 mg by mouth daily.  . cycloSPORINE (RESTASIS) 0.05 % ophthalmic emulsion Place 1 drop into both eyes 2 (  two) times daily.  . fluticasone (FLONASE) 50 MCG/ACT nasal spray Place 1 spray into the nose daily.  Marland Kitchen loratadine (CLARITIN) 5 MG chewable tablet Chew 1 tablet (5 mg total) by mouth daily.  Marland Kitchen loteprednol (LOTEMAX) 0.5 % ophthalmic suspension Place 1 drop into both eyes daily as needed (for dry eyes).   . Multiple Vitamins-Minerals (SENIOR MULTIVITAMIN PLUS) TABS Take 1 tablet by mouth daily.   . NONFORMULARY OR COMPOUNDED ITEM [facility]:  Peripheral Neuropathy Cream - Bupivacaine 1%, Doxepin 3%, Gabapentin 6%, Pentoxifylline 3%, Topiramate 1%, apply 1-2 grams to affected areas  3-4 times daily prn.  . Omega-3 Fatty Acids (FISH OIL) 1200 MG CPDR Take 1,200 mg by mouth daily. Take one tablet by mouth once daily   . pantoprazole (PROTONIX) 40 MG tablet Take 1 tablet (40 mg total) by mouth 2 (two) times daily before a meal.  . pilocarpine (PILOCAR) 1 % ophthalmic solution 1 drop into right eye 4 times daily  . risperiDONE (RISPERDAL) 0.5 MG tablet TAKE 1 TABLET BY MOUTH TWICE A DAY  . Travoprost, BAK Free, (TRAVATAN Z) 0.004 % SOLN ophthalmic solution Place 1 drop into both eyes at bedtime.  . vitamin C (ASCORBIC ACID) 500 MG tablet Take 500 mg by mouth daily.   Allergies  Allergen Reactions  . Aspirin Other (See Comments)    sweats  . Codeine Other (See Comments)  . Iodine Other (See Comments)    Pt is not aware of this allergy, does not recall much info.  . Prednisone Other (See Comments)    sweats  . Lyrica [Pregabalin]     Wobbly, "more than sleepy"  . Benzonatate Itching and Rash  . Sulfa Antibiotics Other (See Comments)    Doesn't remember   Past Medical History:  Diagnosis Date  . Anxiety   . Asthma   . Benign essential hypertension   . Bradycardia, drug induced 06/11/2016  . Edema extremities   . GERD (gastroesophageal reflux disease)   . Glaucoma   . Macular degeneration    Per patient   . Osteoarthritis   . PVD (peripheral vascular disease) ([place])   . Seasonal allergies   . Stroke [facility])    Family History  Problem Relation Age of Onset  . Stroke Father   . Hypertension Father   . Heart disease Mother   . Hypertension Mother   . Diabetes Mother    Past Surgical History:  Procedure Laterality Date  . ABDOMINAL HYSTERECTOMY  1981  . TONSILLECTOMY  1953   Social History   Socioeconomic History  . Marital status: Widowed    Spouse name: Not on file  . Number of children: Not on file  . Years of education: Not on file  . Highest education level: Not on file  Occupational History  . Not on file  Social Needs  . Financial resource  strain: Not hard at all  . Food insecurity:    Worry: Never true    Inability: Never true  . Transportation needs:    Medical: No    Non-medical: No  Tobacco Use  . Smoking status: Never Smoker  . Smokeless tobacco: Never Used  Substance and Sexual Activity  . Alcohol use: No    Alcohol/week: 0.0 standard drinks  . Drug use: No  . Sexual activity: Not Currently  Lifestyle  . Physical activity:    Days per week: 3 days    Minutes per session: 30 min  . Stress: Not at all  Relationships  . Social connections:    Talks on phone: More than three times a week    Gets together: More than three times a week    Attends religious service: More than 4 times per year    Active member of club or organization: No    Attends meetings of clubs or organizations: Never    Relationship status: Widowed  . Intimate partner violence:    Fear of current or ex partner: No    Emotionally abused: No    Physically abused: No    Forced sexual activity: No  Other Topics Concern  . 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  Cardiovascular: negative for chest pain, dyspnea on exertion, edema, orthopnea, palpitations, paroxysmal nocturnal dyspnea or shortness of breath Dermatological: negative for rash Respiratory: negative for cough or wheezing Urologic: negative for hematuria Abdominal: negative for nausea, vomiting, diarrhea, bright red blood per rectum, melena, or hematemesis Neurologic: negative for visual changes, syncope, or dizziness All other  systems reviewed and are otherwise negative except as noted above.   Physical Exam:  Blood pressure (!) 150/80, pulse 68, height 5\' 2"  (1.575 m), weight 140 lb 9.6 oz (63.8 kg), SpO2 97 %.  General appearance: alert, cooperative and no distress Neck: no carotid bruit and no JVD Lungs: clear to auscultation bilaterally Heart: regular rate and rhythm, S1, S2 normal, no murmur, click, rub or gallop Extremities: extremities normal, atraumatic, no cyanosis or edema Pulses: 2+ and symmetric Skin: Skin color, texture, turgor normal. No rashes or lesions Neurologic: Grossly normal  EKG SR w/ chronic RBBB -- personally reviewed   ASSESSMENT AND PLAN:   1. Chest pain: As noted above in HPI, her chest pain is very atypical and not consistent with cardiac pattern.  Her pain seems most consistent with musculoskeletal etiology.  Patient was reassured.  I advised that she try to take scheduled Tylenol every 6 hours to see if this helps.  If she continues to have discomfort I recommended that she follow-up again with her PCP.  2. GERD: Patient also notes symptoms of indigestion after meal intake and also intolerance to milk products.  Patient advised to continue Protonix as recently as prescribed by PCP and to avoid products containing milk  3. HTN: BP is a bit elevated today at 150/80.  We discussed increasing amlodipine.  However after discussion with the patient and her daughter, they would prefer to closely monitor BP at home as she has had issues with fluctuating blood pressures in the past.  She has been seen several times in the hypertension clinic and has had several adjustments made to her amlodipine.  The daughter vows to monitor blood pressure closely at home and will let us know if she continues to have persistently elevated systolic blood pressure greater than 140.   Follow-Up: Continue follow-up with [name] [name] yearly.   Ladoris Gene, MHS [facility] 11/24/2018 11:47 AM

## 2018-11-24 NOTE — Patient Instructions (Addendum)
Medication Instructions:  CONTINUE Tylenol every 6 hours over the next few days, monitor pain level  If you need a refill on your cardiac medications before your next appointment, please call your pharmacy.   Lab work: none If you have labs (blood work) drawn today and your tests are completely normal, you will receive your results only by: Marland Kitchen MyChart Message (if you have MyChart) OR . A paper copy in the mail If you have any lab test that is abnormal or we need to change your treatment, we will call you to review the results.  Testing/Procedures: none  Follow-Up: At [facility], you and your health needs are our priority.  As part of our continuing mission to provide you with exceptional heart care, we have created designated Provider Care Teams.  These Care Teams include your primary Cardiologist (physician) and Advanced Practice Providers (APPs -  Physician Assistants and Nurse Practitioners) who all work together to provide you with the care you need, when you need it. .   Any Other Special Instructions Will Be Listed Below (If Applicable).  Reduce amount of Ensure

## 2018-11-24 NOTE — Patient Instructions (Signed)
Onychomycosis/Fungal Toenails  WHAT IS IT? An infection that lies within the keratin of your nail plate that is caused by a fungus.  WHY ME? Fungal infections affect all ages, sexes, races, and creeds.  There may be many factors that predispose you to a fungal infection such as age, coexisting medical conditions such as diabetes, or an autoimmune disease; stress, medications, fatigue, genetics, etc.  Bottom line: fungus thrives in a warm, moist environment and your shoes offer such a location.  IS IT CONTAGIOUS? Theoretically, yes.  You do not want to share shoes, nail clippers or files with someone who has fungal toenails.  Walking around barefoot in the same room or sleeping in the same bed is unlikely to transfer the organism.  It is important to realize, however, that fungus can spread easily from one nail to the next on the same foot.  HOW DO WE TREAT THIS?  There are several ways to treat this condition.  Treatment may depend on many factors such as age, medications, pregnancy, liver and kidney conditions, etc.  It is best to ask your doctor which options are available to you.  1. No treatment.   Unlike many other medical concerns, you can live with this condition.  However for many people this can be a painful condition and may lead to ingrown toenails or a bacterial infection.  It is recommended that you keep the nails cut short to help reduce the amount of fungal nail. 2. Topical treatment.  These range from herbal remedies to prescription strength nail lacquers.  About 40-50% effective, topicals require twice daily application for approximately 9 to 12 months or until an entirely new nail has grown out.  The most effective topicals are medical grade medications available through physicians offices. 3. Oral antifungal medications.  With an 80-90% cure rate, the most common oral medication requires 3 to 4 months of therapy and stays in your system for a year as the new nail grows out.  Oral  antifungal medications do require blood work to make sure it is a safe drug for you.  A liver function panel will be performed prior to starting the medication and after the first month of treatment.  It is important to have the blood work performed to avoid any harmful side effects.  In general, this medication safe but blood work is required. 4. Laser Therapy.  This treatment is performed by applying a specialized laser to the affected nail plate.  This therapy is noninvasive, fast, and non-painful.  It is not covered by insurance and is therefore, out of pocket.  The results have been very good with a 80-95% cure rate.  [facility] is the only practice in the area to offer this therapy. Permanent Nail Avulsion.  Removing the entire nail so that a new nail will not grow back.Corns and Calluses Corns are small areas of thickened skin that occur on the top, sides, or tip of a toe. They contain a cone-shaped core with a point that can press on a nerve below. This causes pain.  Calluses are areas of thickened skin that can occur anywhere on the body, including the hands, fingers, palms, soles of the feet, and heels. Calluses are usually larger than corns. What are the causes? Corns and calluses are caused by rubbing (friction) or pressure, such as from shoes that are too tight or do not fit properly. What increases the risk? Corns are more likely to develop in people who have misshapen   toes (toe deformities), such as hammer toes. Calluses can occur with friction to any area of the skin. They are more likely to develop in people who:  Work with their hands.  Wear shoes that fit poorly, are too tight, or are high-heeled.  Have toe deformities. What are the signs or symptoms? Symptoms of a corn or callus include:  A hard growth on the skin.  Pain or tenderness under the skin.  Redness and swelling.  Increased discomfort while wearing tight-fitting shoes, if your feet are affected. If a  corn or callus becomes infected, symptoms may include:  Redness and swelling that gets worse.  Pain.  Fluid, blood, or pus draining from the corn or callus. How is this diagnosed? Corns and calluses may be diagnosed based on your symptoms, your medical history, and a physical exam. How is this treated? Treatment for corns and calluses may include:  Removing the cause of the friction or pressure. This may involve: ? Changing your shoes. ? Wearing shoe inserts (orthotics) or other protective layers in your shoes, such as a corn pad. ? Wearing gloves.  Applying medicine to the skin (topical medicine) to help soften skin in the hardened, thickened areas.  Removing layers of dead skin with a file to reduce the size of the corn or callus.  Removing the corn or callus with a scalpel or laser.  Taking antibiotic medicines, if your corn or callus is infected.  Having surgery, if a toe deformity is the cause. Follow these instructions at home:   Take over-the-counter and prescription medicines only as told by your health care provider.  If you were prescribed an antibiotic, take it as told by your health care provider. Do not stop taking it even if your condition starts to improve.  Wear shoes that fit well. Avoid wearing high-heeled shoes and shoes that are too tight or too loose.  Wear any padding, protective layers, gloves, or orthotics as told by your health care provider.  Soak your hands or feet and then use a file or pumice stone to soften your corn or callus. Do this as told by your health care provider.  Check your corn or callus every day for symptoms of infection. Contact a health care provider if you:  Notice that your symptoms do not improve with treatment.  Have redness or swelling that gets worse.  Notice that your corn or callus becomes painful.  Have fluid, blood, or pus coming from your corn or callus.  Have new symptoms. Summary  Corns are small areas of  thickened skin that occur on the top, sides, or tip of a toe.  Calluses are areas of thickened skin that can occur anywhere on the body, including the hands, fingers, palms, and soles of the feet. Calluses are usually larger than corns.  Corns and calluses are caused by rubbing (friction) or pressure, such as from shoes that are too tight or do not fit properly.  Treatment may include wearing any padding, protective layers, gloves, or orthotics as told by your health care provider. This information is not intended to replace advice given to you by your health care provider. Make sure you discuss any questions you have with your health care provider. Document Released: 06/29/2004 Document Revised: 08/06/2017 Document Reviewed: 08/06/2017 Elsevier Interactive Patient Education  2019 Elsevier Inc.  

## 2018-12-03 ENCOUNTER — Encounter: Payer: Self-pay | Admitting: Podiatry

## 2018-12-03 NOTE — Progress Notes (Signed)
Subjective: [name] [name] presents today with painful, thick toenails 1-5 b/l that she cannot cut and which interfere with daily activities.  Pain is aggravated when wearing enclosed shoe gear.  Today patient relates corn on left fourth toe which is painful.  She denies any redness or swelling to the area.  She has not tried to treat it herself.  Patient is also on Plavix and has history of painful peripheral neuropathy.  Reed, Tiffany L, DO is her PCP.   Current Outpatient Medications:  .  acetaminophen (TYLENOL) 500 MG tablet, Take 500 mg by mouth every 6 (six) hours as needed., Disp: , Rfl:  .  albuterol (PROVENTIL HFA;VENTOLIN HFA) 108 (90 Base) MCG/ACT inhaler, Inhale 2 puffs into the lungs every 6 (six) hours as needed for wheezing., Disp: 1 Inhaler, Rfl: 5 .  alum hydroxide-mag trisilicate (GAVISCON) 16-10 MG CHEW chewable tablet, Chew by mouth as needed for indigestion or heartburn., Disp: , Rfl:  .  amLODipine (NORVASC) 5 MG tablet, Take 1 tablet (5 mg total) by mouth daily., Disp: 90 tablet, Rfl: 3 .  atorvastatin (LIPITOR) 10 MG tablet, TAKE 1 TABLET BY MOUTH EVERY DAY, Disp: 90 tablet, Rfl: 1 .  B Complex Vitamins (B COMPLEX PO), Take 1 tablet by mouth daily., Disp: , Rfl:  .  Brinzolamide-Brimonidine (SIMBRINZA) 1-0.2 % SUSP, Apply 1 drop to eye 3 (three) times daily., Disp: , Rfl:  .  budesonide-formoterol (SYMBICORT) 80-4.5 MCG/ACT inhaler, Inhale 2 puffs into the lungs 2 (two) times daily., Disp: , Rfl:  .  Calcium Carbonate-Vitamin D (CALTRATE 600+D) 600-400 MG-UNIT per tablet, Take 2 tablets by mouth daily., Disp: , Rfl:  .  Cholecalciferol (VITAMIN D3) 2000 units TABS, Take 2,000 Units by mouth daily., Disp: , Rfl:  .  clopidogrel (PLAVIX) 75 MG tablet, TAKE 1 TABLET (75 MG TOTAL) BY MOUTH DAILY., Disp: 90 tablet, Rfl: 1 .  Coenzyme Q10 (COQ10) 50 MG CAPS, Take 50 mg by mouth daily., Disp: , Rfl:  .  cycloSPORINE (RESTASIS) 0.05 % ophthalmic emulsion, Place 1 drop into both  eyes 2 (two) times daily., Disp: , Rfl:  .  fluticasone (FLONASE) 50 MCG/ACT nasal spray, Place 1 spray into the nose daily., Disp: , Rfl:  .  loratadine (CLARITIN) 5 MG chewable tablet, Chew 1 tablet (5 mg total) by mouth daily., Disp: 30 tablet, Rfl: 6 .  loteprednol (LOTEMAX) 0.5 % ophthalmic suspension, Place 1 drop into both eyes daily as needed (for dry eyes). , Disp: , Rfl:  .  Multiple Vitamins-Minerals (SENIOR MULTIVITAMIN PLUS) TABS, Take 1 tablet by mouth daily. , Disp: , Rfl:  .  NONFORMULARY OR COMPOUNDED ITEM, [facility]:  Peripheral Neuropathy Cream - Bupivacaine 1%, Doxepin 3%, Gabapentin 6%, Pentoxifylline 3%, Topiramate 1%, apply 1-2 grams to affected areas 3-4 times daily prn., Disp: 100 each, Rfl: 5 .  Omega-3 Fatty Acids (FISH OIL) 1200 MG CPDR, Take 1,200 mg by mouth daily. Take one tablet by mouth once daily , Disp: , Rfl:  .  pantoprazole (PROTONIX) 40 MG tablet, Take 1 tablet (40 mg total) by mouth 2 (two) times daily before a meal., Disp: 60 tablet, Rfl: 3 .  pilocarpine (PILOCAR) 1 % ophthalmic solution, 1 drop into right eye 4 times daily, Disp: , Rfl: 99 .  risperiDONE (RISPERDAL) 0.5 MG tablet, TAKE 1 TABLET BY MOUTH TWICE A DAY, Disp: 120 tablet, Rfl: 1 .  Travoprost, BAK Free, (TRAVATAN Z) 0.004 % SOLN ophthalmic solution, Place 1 drop into both  eyes at bedtime., Disp: 5 mL, Rfl: 5 .  vitamin C (ASCORBIC ACID) 500 MG tablet, Take 500 mg by mouth daily., Disp: , Rfl:   Allergies  Allergen Reactions  . Aspirin Other (See Comments)    sweats  . Codeine Other (See Comments)  . Iodine Other (See Comments)    Pt is not aware of this allergy, does not recall much info.  . Prednisone Other (See Comments)    sweats  . Lyrica [Pregabalin]     Wobbly, "more than sleepy"  . Benzonatate Itching and Rash  . Sulfa Antibiotics Other (See Comments)    Doesn't remember    Objective:  Vascular Examination: Capillary refill time less than 3 seconds x 10  digits.  Dorsalis pedis and Posterior tibial pulses palpable b/l.  Digital hair absent x10 digits.  Skin temperature gradient WNL b/l  Dermatological Examination: Skin with normal turgor, texture and tone b/l.  Toenails 1-5 b/l discolored, thick, dystrophic with subungual debris and pain with palpation to nailbeds due to thickness of nails.  Hyperkeratotic lesion noted PIPJ of the left fourth digit.  No open wounds noted bilaterally.  Musculoskeletal: Muscle strength 5/5 to all LE muscle groups  Hammertoe deformity left fourth digit.  No pain, crepitus or joint limitation noted with ROM.   Neurological: Sensation intact with 10 gram monofilament.  Vibratory sensation intact.  Assessment: Painful onychomycosis toenails 1-5 b/l  Painful corn left fourth digit. Peripheral neuropathy Patient on long-term blood thinner.  Plan: 1. Toenails 1-5 b/l were debrided in length and girth without iatrogenic bleeding. 2. Painful corn pared with sterile scalpel blade without incident.  Silicone padding dispensed to patient for daily comfort. 3. Patient to continue soft, supportive shoe gear. 4. Patient to report any pedal injuries to medical professional immediately. 5. Follow up 3 months.  6. Patient/POA to call should there be a concern in the interim.

## 2018-12-18 ENCOUNTER — Other Ambulatory Visit: Payer: Self-pay | Admitting: *Deleted

## 2018-12-18 ENCOUNTER — Telehealth: Payer: Self-pay | Admitting: *Deleted

## 2018-12-18 DIAGNOSIS — R1012 Left upper quadrant pain: Secondary | ICD-10-CM

## 2018-12-18 MED ORDER — PANTOPRAZOLE SODIUM 40 MG PO TBEC: 40.0000 mg | DELAYED_RELEASE_TABLET | Freq: Two times a day (BID) | ORAL | 11 refills | Status: DC

## 2018-12-18 NOTE — Telephone Encounter (Signed)
Daughter calling stating that pt's insurance will only give pt 90 tablets a year of Protonix. Pt takes twice daily and will have to cash pay $172.00 for the other tablets. Daughter is asking if we can do an quantity limit exception?

## 2018-12-18 NOTE — Telephone Encounter (Signed)
Yes, let's get the paperwork to do the quantity exception, please.  She definitely needs it.  Without it, she gets chest pains and a lot of indigestion.

## 2018-12-21 ENCOUNTER — Telehealth: Payer: Self-pay

## 2018-12-21 NOTE — Telephone Encounter (Signed)
Called [facility] prior authorization department. Give information and diagnosis they requested. They have agreed to refill patient's medication. Faxed pharmacy to process request for patient.

## 2018-12-21 NOTE — Telephone Encounter (Signed)
Prior auth done by [place] today

## 2019-01-05 DIAGNOSIS — I10 Essential (primary) hypertension: Secondary | ICD-10-CM | POA: Diagnosis not present

## 2019-01-05 DIAGNOSIS — Z79899 Other long term (current) drug therapy: Secondary | ICD-10-CM | POA: Diagnosis not present

## 2019-01-05 DIAGNOSIS — K219 Gastro-esophageal reflux disease without esophagitis: Secondary | ICD-10-CM | POA: Diagnosis not present

## 2019-01-05 DIAGNOSIS — H401113 Primary open-angle glaucoma, right eye, severe stage: Secondary | ICD-10-CM | POA: Diagnosis not present

## 2019-01-05 DIAGNOSIS — Z8673 Personal history of transient ischemic attack (TIA), and cerebral infarction without residual deficits: Secondary | ICD-10-CM | POA: Diagnosis not present

## 2019-01-05 DIAGNOSIS — J45909 Unspecified asthma, uncomplicated: Secondary | ICD-10-CM | POA: Diagnosis not present

## 2019-01-05 DIAGNOSIS — Z7902 Long term (current) use of antithrombotics/antiplatelets: Secondary | ICD-10-CM | POA: Diagnosis not present

## 2019-01-21 ENCOUNTER — Other Ambulatory Visit: Payer: Self-pay

## 2019-01-21 ENCOUNTER — Encounter: Payer: Self-pay | Admitting: Internal Medicine

## 2019-01-21 ENCOUNTER — Ambulatory Visit (INDEPENDENT_AMBULATORY_CARE_PROVIDER_SITE_OTHER): Payer: Medicare Other | Admitting: Internal Medicine

## 2019-01-21 DIAGNOSIS — K5904 Chronic idiopathic constipation: Secondary | ICD-10-CM

## 2019-01-21 DIAGNOSIS — R1012 Left upper quadrant pain: Secondary | ICD-10-CM | POA: Diagnosis not present

## 2019-01-21 DIAGNOSIS — M1612 Unilateral primary osteoarthritis, left hip: Secondary | ICD-10-CM

## 2019-01-21 DIAGNOSIS — J4520 Mild intermittent asthma, uncomplicated: Secondary | ICD-10-CM

## 2019-01-21 DIAGNOSIS — H401130 Primary open-angle glaucoma, bilateral, stage unspecified: Secondary | ICD-10-CM

## 2019-01-21 DIAGNOSIS — F22 Delusional disorders: Secondary | ICD-10-CM | POA: Diagnosis not present

## 2019-01-21 NOTE — Progress Notes (Signed)
[name], female   DOB: [birth year], 85 y.o.   MRN: [id number] This service is provided via telemedicine  No vital signs collected/recorded due to the encounter was a telemedicine visit.   Location of patient (ex: home, work):  HOME  Patient consents to a telephone visit:  YES  Location of the provider (ex: office, home):  OFFICE  Name of any referring provider:  Kiefer Opheim, DO   Names of all persons participating in the telemedicine service and their role in the encounter: Patient, [name], CMA, Dr. Hollace Kinnier, DO  Time spent on call:  3:06    Provider:  Tarin Johndrow [name], D.O., C.M.D.  Code Status: DNR Goals of Care:  Advanced Directives 07/31/2018  Does Patient Have a Medical Advance Directive? Yes  Type of Paramedic of [place];Living will  Does patient want to make changes to medical advance directive? No - Patient declined  Copy of [place] in Chart? No - copy requested  Would patient like information on creating a medical advance directive? -     No chief complaint on file.   HPI: Patient is a 85 y.o. female seen today for medical management of chronic diseases.    She is doing pretty good.  The only problem she says she has is that same thing that goes from her side to her hip, it seems like.  She is not sure if it's something she's eating.  The gas is under control.  She's on protonix twice a day and gaviscon as needed for the gas.    She says the pharmacist gave her some paperwork that said she could have hip pain form the protonix.   She reports it's not really a pain, but a heaviness like something settled in her hip, not really pain.  She eliminated eating everything she thought might cause it.  It comes and goes.    Breathing is doing well.  She is on prn albuterol--not needing to use it.  On symbicort regularly.  She had surgery two weeks ago on her right eye for her glaucoma with Dr.  Edilia Bo.  It went well.  Not clear yet.  She has to go back on Monday.  Some improvement b/c not having pain anymore.  Eye drops--just on simbrinza and travoprost now.  The others for her right are are new--there are two.  She went to get the drops she is using now for the right eye:  One was a sample he gave her--she doesn't see the name on it--her son, [name] [name], came and spelled the name for her:  Prednisolone acetate is every two hours right now.  The other one is Tropicamide oph once a day.     Past Medical History:  Diagnosis Date  . Anxiety   . Asthma   . Benign essential hypertension   . Bradycardia, drug induced 06/11/2016  . Edema extremities   . GERD (gastroesophageal reflux disease)   . Glaucoma   . Macular degeneration    Per patient   . Osteoarthritis   . PVD (peripheral vascular disease) ([place])   . Seasonal allergies   . Stroke [facility])     Past Surgical History:  Procedure Laterality Date  . ABDOMINAL HYSTERECTOMY  1981  . TONSILLECTOMY  1953    Allergies  Allergen Reactions  . Aspirin Other (See Comments)    sweats  . Codeine Other (See Comments)  . Iodine Other (See Comments)  Pt is not aware of this allergy, does not recall much info.  . Prednisone Other (See Comments)    sweats  . Lyrica [Pregabalin]     Wobbly, "more than sleepy"  . Benzonatate Itching and Rash  . Sulfa Antibiotics Other (See Comments)    Doesn't remember    Outpatient Encounter Medications as of 01/21/2019  Medication Sig  . acetaminophen (TYLENOL) 500 MG tablet Take 500 mg by mouth every 6 (six) hours as needed.  Marland Kitchen albuterol (PROVENTIL HFA;VENTOLIN HFA) 108 (90 Base) MCG/ACT inhaler Inhale 2 puffs into the lungs every 6 (six) hours as needed for wheezing.  Marland Kitchen alum hydroxide-mag trisilicate (GAVISCON) 16-10 MG CHEW chewable tablet Chew by mouth as needed for indigestion or heartburn.  Marland Kitchen amLODipine (NORVASC) 5 MG tablet Take 1 tablet (5 mg total) by mouth daily.  Marland Kitchen atorvastatin (LIPITOR) 10 MG  tablet TAKE 1 TABLET BY MOUTH EVERY DAY  . B Complex Vitamins (B COMPLEX PO) Take 1 tablet by mouth daily.  . Brinzolamide-Brimonidine (SIMBRINZA) 1-0.2 % SUSP Apply 1 drop to eye 3 (three) times daily.  . budesonide-formoterol (SYMBICORT) 80-4.5 MCG/ACT inhaler Inhale 2 puffs into the lungs 2 (two) times daily.  . Calcium Carbonate-Vitamin D (CALTRATE 600+D) 600-400 MG-UNIT per tablet Take 2 tablets by mouth daily.  . Cholecalciferol (VITAMIN D3) 2000 units TABS Take 2,000 Units by mouth daily.  . clopidogrel (PLAVIX) 75 MG tablet TAKE 1 TABLET (75 MG TOTAL) BY MOUTH DAILY.  Marland Kitchen Coenzyme Q10 (COQ10) 50 MG CAPS Take 50 mg by mouth daily.  . cycloSPORINE (RESTASIS) 0.05 % ophthalmic emulsion Place 1 drop into both eyes 2 (two) times daily.  . fluticasone (FLONASE) 50 MCG/ACT nasal spray Place 1 spray into the nose daily.  Marland Kitchen loratadine (CLARITIN) 5 MG chewable tablet Chew 1 tablet (5 mg total) by mouth daily.  Marland Kitchen loteprednol (LOTEMAX) 0.5 % ophthalmic suspension Place 1 drop into both eyes daily as needed (for dry eyes).   . Multiple Vitamins-Minerals (SENIOR MULTIVITAMIN PLUS) TABS Take 1 tablet by mouth daily.   . NONFORMULARY OR COMPOUNDED ITEM [facility]:  Peripheral Neuropathy Cream - Bupivacaine 1%, Doxepin 3%, Gabapentin 6%, Pentoxifylline 3%, Topiramate 1%, apply 1-2 grams to affected areas 3-4 times daily prn.  . Omega-3 Fatty Acids (FISH OIL) 1200 MG CPDR Take 1,200 mg by mouth daily. Take one tablet by mouth once daily   . pantoprazole (PROTONIX) 40 MG tablet Take 1 tablet (40 mg total) by mouth 2 (two) times daily before a meal.  . pilocarpine (PILOCAR) 1 % ophthalmic solution 1 drop into right eye 4 times daily  . risperiDONE (RISPERDAL) 0.5 MG tablet TAKE 1 TABLET BY MOUTH TWICE A DAY  . Travoprost, BAK Free, (TRAVATAN Z) 0.004 % SOLN ophthalmic solution Place 1 drop into both eyes at bedtime.  . vitamin C (ASCORBIC ACID) 500 MG tablet Take 500 mg by mouth daily.   No  facility-administered encounter medications on file as of 01/21/2019.     Review of Systems:  Review of Systems  Constitutional: Negative for chills, fever, malaise/fatigue and weight loss.  HENT: Positive for hearing loss.   Eyes: Negative for blurred vision and pain.       No longer having eye pain after her glaucoma surgery  Respiratory: Negative for cough, shortness of breath and wheezing.        Not needing albuterol  Cardiovascular: Negative for chest pain, palpitations and leg swelling.  Gastrointestinal: Positive for abdominal pain and heartburn. Negative for  blood in stool, constipation, diarrhea, melena, nausea and vomiting.  Genitourinary: Negative for dysuria.  Musculoskeletal: Positive for joint pain. Negative for back pain, falls, myalgias and neck pain.  Skin: Negative for itching and rash.  Neurological: Positive for tingling and sensory change. Negative for dizziness and loss of consciousness.  Endo/Heme/Allergies: Does not bruise/bleed easily.  Psychiatric/Behavioral: Positive for memory loss. Negative for depression and hallucinations. The patient is not nervous/anxious and does not have insomnia.     Health Maintenance  Topic Date Due  . TETANUS/TDAP  10/04/1943  . INFLUENZA VACCINE  05/08/2019  . MAMMOGRAM  11/19/2019  . DEXA SCAN  Completed  . PNA vac Low Risk Adult  Completed    Physical Exam: Could not be performed as visit non face-to-face via phone  Labs reviewed: Basic Metabolic Panel: Recent Labs    09/14/18 1547  NA 142  K 4.1  CL 107  CO2 27  GLUCOSE 92  BUN 18  CREATININE 0.97*  CALCIUM 9.7   Liver Function Tests: Recent Labs    09/14/18 1547  AST 23  ALT 17  BILITOT 0.8  PROT 6.8   No results for input(s): LIPASE, AMYLASE in the last 8760 hours. No results for input(s): AMMONIA in the last 8760 hours. CBC: Recent Labs    09/14/18 1547  WBC 6.0  NEUTROABS 3,384  HGB 12.2  HCT 36.4  MCV 94.3  PLT 213   Lipid  Panel: Recent Labs    09/14/18 1547  CHOL 144  HDL 75  LDLCALC 55  TRIG 61  CHOLHDL 1.9   Lab Results  Component Value Date   HGBA1C 5.6 04/22/2016    Procedures since last visit: No results found.  Assessment/Plan 1. Left upper quadrant pain -seems associated with reflux and gas -cont protonix as ordered and prn gaviscon  2. Primary open angle glaucoma of both eyes, unspecified glaucoma stage -s/p surgery with Dr. Edilia Bo and on steroid and other new drop temporarily  3. Mild intermittent asthma without complication -doing well, cont symbicort routinely and albuterol if needed  4. Chronic idiopathic constipation -cont current regimen, bowels are moving  5. Paranoid type delusional disorder ([place]) -no recent issues, continues on low dose risperdal  6. Primary osteoarthritis of left hip -an issue some lately, but unclear if gas pains or arthritis--comes and goes -may use tylenol and voltaren for this if needed  Labs/tests ordered:  No new Next appt:  05/27/2019 Non face-to-face time spent on televisit:  21 minutes  Anandi Abramo L. Giani Betzold, D.O. [facility] [address], [place] [address] Cell Phone (Mon-Fri 8am-5pm):  [phone] On Call:  [phone] & follow prompts after 5pm & weekends Office Phone:  [phone] Office Fax:  [phone]

## 2019-01-21 NOTE — Patient Instructions (Signed)
Be sure to continue your protonix for your GERD. If you are having gas pains in your side, use the gaviscon for that.   Try to stay active in your home.   Best of luck recovering from your eye surgery.

## 2019-02-24 ENCOUNTER — Ambulatory Visit: Payer: Medicare Other | Admitting: Podiatry

## 2019-03-08 ENCOUNTER — Telehealth: Payer: Self-pay | Admitting: *Deleted

## 2019-03-08 NOTE — Telephone Encounter (Signed)
It's ok to use both.

## 2019-03-08 NOTE — Telephone Encounter (Signed)
Katharine Look notified and agreed.

## 2019-03-08 NOTE — Telephone Encounter (Signed)
Patient Caregiver, Katharine Look called and wanted to know if patient should be taking both Gaviscon and Pantoprazole. Stated that patient was wondering. Please Advise.

## 2019-03-09 ENCOUNTER — Other Ambulatory Visit: Payer: Self-pay | Admitting: *Deleted

## 2019-03-10 ENCOUNTER — Other Ambulatory Visit: Payer: Self-pay | Admitting: Cardiology

## 2019-03-27 ENCOUNTER — Other Ambulatory Visit: Payer: Self-pay | Admitting: Internal Medicine

## 2019-04-13 ENCOUNTER — Encounter: Payer: Self-pay | Admitting: Family

## 2019-04-13 ENCOUNTER — Ambulatory Visit (INDEPENDENT_AMBULATORY_CARE_PROVIDER_SITE_OTHER): Payer: Medicare Other | Admitting: Family

## 2019-04-13 ENCOUNTER — Other Ambulatory Visit: Payer: Self-pay

## 2019-04-13 DIAGNOSIS — M25562 Pain in left knee: Secondary | ICD-10-CM

## 2019-04-13 DIAGNOSIS — R252 Cramp and spasm: Secondary | ICD-10-CM | POA: Diagnosis not present

## 2019-04-13 DIAGNOSIS — G8929 Other chronic pain: Secondary | ICD-10-CM | POA: Diagnosis not present

## 2019-04-13 MED ORDER — BIOFREEZE 4 % EX GEL: 1.0000 "application " | Freq: Three times a day (TID) | CUTANEOUS | 3 refills | Status: DC

## 2019-04-13 NOTE — Progress Notes (Signed)
This service is provided via telemedicine  No vital signs collected/recorded due to the encounter was a telemedicine visit.   Location of patient (ex: home, work):  Home   Patient consents to a telephone visit:  Yes  Location of the provider (ex: office, home):  Office   Name of any referring provider:  Dr. Hollace Kinnier   Names of all persons participating in the telemedicine service and their role in the encounter:  Ruthell Rummage CMA, Dinah Ngetich NP, Cathrine Muster   Time spent on call: Ruthell Rummage CMA spent 13 Minutes with patient on phone     [facility] clinic  Provider: Marlowe Sax, NP  Code Status: Full Code  Goals of Care:  Advanced Directives 07/31/2018  Does Patient Have a Medical Advance Directive? Yes  Type of Paramedic of [place];Living will  Does patient want to make changes to medical advance directive? No - Patient declined  Copy of [place] in Chart? No - copy requested  Would patient like information on creating a medical advance directive? -     Chief Complaint  Patient presents with  . Acute Visit    Experiencing leg cramps in both legs, states that cramps started yesterday, soreness started last week and has been walking more to try to relieve soreness.   . Medical Management of Chronic Issues    Patient would like cream for her knees states they are sore    HPI: Patient is a 85 y.o. female seen today for an acute visit for leg cramps x 1 day.she states had cramps on both legs yesterday.she describes cramps as " soreness" which resolved after walking.she states had a procedure for her eyes so she has been sitting more than usual.she has not taken any medication to relief her symptoms. Also request cream for her chronic knee pain.she denies any fever,chills,numbness,tingling or weakness of legs.she drinks 6 glasses of water daily.states has a good appetite.   Past Medical History:  Diagnosis Date  . Anxiety    . Asthma   . Benign essential hypertension   . Bradycardia, drug induced 06/11/2016  . Edema extremities   . GERD (gastroesophageal reflux disease)   . Glaucoma   . Macular degeneration    Per patient   . Osteoarthritis   . PVD (peripheral vascular disease) ([place])   . Seasonal allergies   . Stroke [facility])     Past Surgical History:  Procedure Laterality Date  . ABDOMINAL HYSTERECTOMY  1981  . TONSILLECTOMY  1953    Allergies  Allergen Reactions  . Aspirin Other (See Comments)    sweats  . Codeine Other (See Comments)  . Iodine Other (See Comments)    Pt is not aware of this allergy, does not recall much info.  . Prednisone Other (See Comments)    sweats  . Lyrica [Pregabalin]     Wobbly, "more than sleepy"  . Benzonatate Itching and Rash  . Sulfa Antibiotics Other (See Comments)    Doesn't remember    Outpatient Encounter Medications as of 04/13/2019  Medication Sig  . acetaminophen (TYLENOL) 500 MG tablet Take 500 mg by mouth every 6 (six) hours as needed.  Marland Kitchen albuterol (PROVENTIL HFA;VENTOLIN HFA) 108 (90 Base) MCG/ACT inhaler Inhale 2 puffs into the lungs every 6 (six) hours as needed for wheezing.  Marland Kitchen alum hydroxide-mag trisilicate (GAVISCON) 41-93 MG CHEW chewable tablet Chew by mouth as needed for indigestion or heartburn.  Marland Kitchen amLODipine (NORVASC) 5 MG tablet  TAKE 1 TABLET BY MOUTH EVERY DAY  . atorvastatin (LIPITOR) 10 MG tablet TAKE 1 TABLET BY MOUTH EVERY DAY  . B Complex Vitamins (B COMPLEX PO) Take 1 tablet by mouth daily.  . Brinzolamide-Brimonidine (SIMBRINZA) 1-0.2 % SUSP Apply 1 drop to eye 3 (three) times daily.  . budesonide-formoterol (SYMBICORT) 80-4.5 MCG/ACT inhaler Inhale 2 puffs into the lungs 2 (two) times daily.  . Calcium Carbonate-Vitamin D (CALTRATE 600+D) 600-400 MG-UNIT per tablet Take 2 tablets by mouth daily.  . Cholecalciferol (VITAMIN D3) 2000 units TABS Take 2,000 Units by mouth daily.  . clopidogrel (PLAVIX) 75 MG tablet TAKE 1 TABLET (75 MG  TOTAL) BY MOUTH DAILY.  Marland Kitchen Coenzyme Q10 (COQ10) 50 MG CAPS Take 50 mg by mouth daily.  . fluticasone (FLONASE) 50 MCG/ACT nasal spray Place 1 spray into the nose daily.  Marland Kitchen loratadine (CLARITIN) 5 MG chewable tablet Chew 1 tablet (5 mg total) by mouth daily.  . Multiple Vitamins-Minerals (SENIOR MULTIVITAMIN PLUS) TABS Take 1 tablet by mouth daily.   . NONFORMULARY OR COMPOUNDED ITEM [facility]:  Peripheral Neuropathy Cream - Bupivacaine 1%, Doxepin 3%, Gabapentin 6%, Pentoxifylline 3%, Topiramate 1%, apply 1-2 grams to affected areas 3-4 times daily prn.  . Omega-3 Fatty Acids (FISH OIL) 1200 MG CPDR Take 1,200 mg by mouth daily. Take one tablet by mouth once daily   . pantoprazole (PROTONIX) 40 MG tablet Take 1 tablet (40 mg total) by mouth 2 (two) times daily before a meal.  . PREDNISOLONE ACETATE OP Place 1 drop into the right eye 3 (three) times daily.  . risperiDONE (RISPERDAL) 0.5 MG tablet TAKE 1 TABLET BY MOUTH TWICE A DAY  . travoprost, benzalkonium, (TRAVATAN) 0.004 % ophthalmic solution Place 1 drop into the left eye at bedtime.  . TROPICAMIDE OP Place 1 drop into the right eye daily.  . vitamin C (ASCORBIC ACID) 500 MG tablet Take 500 mg by mouth daily.  . [DISCONTINUED] Travoprost, BAK Free, (TRAVATAN Z) 0.004 % SOLN ophthalmic solution Place 1 drop into both eyes at bedtime. (Patient taking differently: Place 1 drop into the left eye at bedtime. )   No facility-administered encounter medications on file as of 04/13/2019.     Review of Systems:  Review of Systems  Constitutional: Negative for appetite change, chills, fatigue and fever.       Drinks 6 glasses of water daily   HENT: Negative for congestion, rhinorrhea, sinus pressure, sinus pain, sneezing and sore throat.   Eyes: Positive for visual disturbance. Negative for discharge, redness and itching.       Wears eye glasses  Respiratory: Negative for cough, chest tightness, shortness of breath and wheezing.    Cardiovascular: Negative for chest pain, palpitations and leg swelling.  Gastrointestinal: Negative for abdominal distention, abdominal pain, constipation, diarrhea, nausea and vomiting.  Genitourinary: Negative for decreased urine volume, difficulty urinating, dysuria, flank pain and frequency.  Musculoskeletal: Positive for arthralgias and gait problem.       Knee pain.Uses a cane when outside   Skin: Negative for color change, pallor and rash.  Neurological: Negative for dizziness, weakness, light-headedness, numbness and headaches.  Psychiatric/Behavioral: Negative for agitation and sleep disturbance. The patient is not nervous/anxious.     Health Maintenance  Topic Date Due  . TETANUS/TDAP  10/04/1943  . INFLUENZA VACCINE  05/08/2019  . MAMMOGRAM  11/19/2019  . DEXA SCAN  Completed  . PNA vac Low Risk Adult  Completed    Physical Exam: There were no  vitals filed for this visit. There is no height or weight on file to calculate BMI. Physical Exam Unable to complete on telephone visit.   Labs reviewed: Basic Metabolic Panel: Recent Labs    09/14/18 1547  NA 142  K 4.1  CL 107  CO2 27  GLUCOSE 92  BUN 18  CREATININE 0.97*  CALCIUM 9.7   Liver Function Tests: Recent Labs    09/14/18 1547  AST 23  ALT 17  BILITOT 0.8  PROT 6.8   CBC: Recent Labs    09/14/18 1547  WBC 6.0  NEUTROABS 3,384  HGB 12.2  HCT 36.4  MCV 94.3  PLT 213   Lipid Panel: Recent Labs    09/14/18 1547  CHOL 144  HDL 75  LDLCALC 55  TRIG 61  CHOLHDL 1.9   Lab Results  Component Value Date   HGBA1C 5.6 04/22/2016    Procedures since last visit:  Assessment/Plan 1. Leg cramps Afebrile.Leg cramps went away after walking in the house.No pain or mylagia.Drinks 6 glasses of water daily.will get lab work to rule out electrolytes def and anemia.  - CBC with Differential/Platelet; Future - CMP with eGFR([facility]); Future  2. Chronic Knee pain  Apply Biofreeze gel one application  three times daily for pain.encouraged to exercise by  walk daily as tolerated.  Labs/tests ordered:   - CBC with Differential/Platelet; Future - CMP with eGFR([facility]); Future - Mg level   Next appt:  05/27/2019 with Dr.Reed  Time spent with patient 11 minutes >50% time spent counseling; reviewing medical record; tests; labs; and developing future plan of care

## 2019-04-14 ENCOUNTER — Other Ambulatory Visit: Payer: Self-pay

## 2019-04-14 ENCOUNTER — Telehealth: Payer: Self-pay

## 2019-04-14 ENCOUNTER — Encounter: Payer: Self-pay | Admitting: Family

## 2019-04-14 ENCOUNTER — Ambulatory Visit (INDEPENDENT_AMBULATORY_CARE_PROVIDER_SITE_OTHER): Payer: Medicare Other | Admitting: Family

## 2019-04-14 DIAGNOSIS — N39498 Other specified urinary incontinence: Secondary | ICD-10-CM

## 2019-04-14 NOTE — Telephone Encounter (Signed)
Spoke with patient, scheduled telephone appointment for today at 3:45 pm with [facility]

## 2019-04-14 NOTE — Telephone Encounter (Signed)
Patient called stating she had a telephone visit with Dinah yesterday and Dinah asked if she had any urinary issues. At the time she denied urinary issues, yet she has dealt with Urinary Incontinence off/on x 4 months. Patient would like to know if she can get treatment for urinary incontinence. Patient had an episode last night where she was unable to get to the bathroom in time and urinated on herself, this prompted patient to call today.   Patient aware this concern may require another visit for there is no documentation of this conversation in yesterday's visit.   Dinah please advise

## 2019-04-14 NOTE — Progress Notes (Signed)
This service is provided via telemedicine  No vital signs collected/recorded due to the encounter was a telemedicine visit.   Location of patient (ex: home, work):  Home   Patient consents to a telephone visit:  Yes  Location of the provider (ex: office, home):  Office  Name of any referring provider: N/A  Names of all persons participating in the telemedicine service and their role in the encounter:  Tenzin Pavon, Marisa Cyphers RMA, Dinah Ngetich   Time spent on call:  10 min   Provider: Dinah Ngetich FNP-C  Gayland Curry, DO  Patient Care Team: Gayland Curry, DO as PCP - General (Geriatric Medicine) Sueanne Margarita, MD as PCP - Cardiology (Cardiology) Trula Slade, DPM as Consulting Physician (Podiatry)  Extended Emergency Contact Information Primary Emergency Contact: Worm,Carl           [address] [place] Phone: [phone] Mobile Phone: [phone] Relation: Son Secondary Emergency Contact: Stormy Fabian [place] Phone: [phone] Mobile Phone: [phone] Relation: Daughter  Goals of care: Advanced Directive information Advanced Directives 04/14/2019  Does Patient Have a Medical Advance Directive? No  Type of Advance Directive -  Does patient want to make changes to medical advance directive? -  Copy of [place] in Chart? -  Would patient like information on creating a medical advance directive? No - Patient declined     Chief Complaint  Patient presents with  . Acute Visit    Acute urinary incontinence    HPI:  Pt is a 85 y.o. female seen today for an acute visit for evaluation of urinary incontinence.she states had an episode last night when she woke up all her bedding was soaked with urine.she had similar episode about 6 -8 months ago but forgot to notify provider yesterday.she states has had urgency and sometimes does not make it to the bathroom. She denies any  fever,chills,abdominal/back pain,nausea or vomiting.    Past Medical History:  Diagnosis Date  . Anxiety   . Asthma   . Benign essential hypertension   . Bradycardia, drug induced 06/11/2016  . Edema extremities   . GERD (gastroesophageal reflux disease)   . Glaucoma   . Macular degeneration    Per patient   . Osteoarthritis   . PVD (peripheral vascular disease) ([place])   . Seasonal allergies   . Stroke [facility])    Past Surgical History:  Procedure Laterality Date  . ABDOMINAL HYSTERECTOMY  1981  . TONSILLECTOMY  1953    Allergies  Allergen Reactions  . Aspirin Other (See Comments)    sweats  . Codeine Other (See Comments)  . Iodine Other (See Comments)    Pt is not aware of this allergy, does not recall much info.  . Prednisone Other (See Comments)    sweats  . Lyrica [Pregabalin]     Wobbly, "more than sleepy"  . Benzonatate Itching and Rash  . Sulfa Antibiotics Other (See Comments)    Doesn't remember    Outpatient Encounter Medications as of 04/14/2019  Medication Sig  . acetaminophen (TYLENOL) 500 MG tablet Take 500 mg by mouth every 6 (six) hours as needed.  Marland Kitchen albuterol (PROVENTIL HFA;VENTOLIN HFA) 108 (90 Base) MCG/ACT inhaler Inhale 2 puffs into the lungs every 6 (six) hours as needed for wheezing.  Marland Kitchen alum hydroxide-mag trisilicate (GAVISCON) 02-77 MG CHEW chewable tablet Chew by mouth as needed for indigestion or heartburn.  Marland Kitchen amLODipine (NORVASC) 5 MG tablet TAKE  1 TABLET BY MOUTH EVERY DAY  . atorvastatin (LIPITOR) 10 MG tablet TAKE 1 TABLET BY MOUTH EVERY DAY  . B Complex Vitamins (B COMPLEX PO) Take 1 tablet by mouth daily.  . Brinzolamide-Brimonidine (SIMBRINZA) 1-0.2 % SUSP Apply 1 drop to eye 3 (three) times daily.  . budesonide-formoterol (SYMBICORT) 80-4.5 MCG/ACT inhaler Inhale 2 puffs into the lungs 2 (two) times daily.  . Calcium Carbonate-Vitamin D (CALTRATE 600+D) 600-400 MG-UNIT per tablet Take 2 tablets by mouth daily.  . Cholecalciferol (VITAMIN  D3) 2000 units TABS Take 2,000 Units by mouth daily.  . clopidogrel (PLAVIX) 75 MG tablet TAKE 1 TABLET (75 MG TOTAL) BY MOUTH DAILY.  Marland Kitchen Coenzyme Q10 (COQ10) 50 MG CAPS Take 50 mg by mouth daily.  . fluticasone (FLONASE) 50 MCG/ACT nasal spray Place 1 spray into the nose daily.  Marland Kitchen loratadine (CLARITIN) 5 MG chewable tablet Chew 1 tablet (5 mg total) by mouth daily.  . Menthol, Topical Analgesic, (BIOFREEZE) 4 % GEL Apply 1 application topically 3 (three) times daily. Apply to knee  . Multiple Vitamins-Minerals (SENIOR MULTIVITAMIN PLUS) TABS Take 1 tablet by mouth daily.   . NONFORMULARY OR COMPOUNDED ITEM [facility]:  Peripheral Neuropathy Cream - Bupivacaine 1%, Doxepin 3%, Gabapentin 6%, Pentoxifylline 3%, Topiramate 1%, apply 1-2 grams to affected areas 3-4 times daily prn.  . Omega-3 Fatty Acids (FISH OIL) 1200 MG CPDR Take 1,200 mg by mouth daily. Take one tablet by mouth once daily   . pantoprazole (PROTONIX) 40 MG tablet Take 1 tablet (40 mg total) by mouth 2 (two) times daily before a meal.  . PREDNISOLONE ACETATE OP Place 1 drop into the right eye 3 (three) times daily.  . risperiDONE (RISPERDAL) 0.5 MG tablet TAKE 1 TABLET BY MOUTH TWICE A DAY  . travoprost, benzalkonium, (TRAVATAN) 0.004 % ophthalmic solution Place 1 drop into the left eye at bedtime.  . TROPICAMIDE OP Place 1 drop into the right eye daily.  . vitamin C (ASCORBIC ACID) 500 MG tablet Take 500 mg by mouth daily.   No facility-administered encounter medications on file as of 04/14/2019.     Review of Systems  Constitutional: Negative for chills, fatigue and fever.  Cardiovascular: Negative for chest pain, palpitations and leg swelling.  Gastrointestinal: Negative for abdominal distention, abdominal pain, constipation, diarrhea, nausea and vomiting.  Endocrine: Negative for cold intolerance, heat intolerance, polydipsia, polyphagia and polyuria.  Genitourinary: Positive for frequency and urgency. Negative for  decreased urine volume, difficulty urinating, dysuria, flank pain and hematuria.       Urine urgency x 6 - 8 months but not often just about 2 months apart. Some time urinate a lot and at times a little.   Skin: Negative for color change, pallor and rash.  Neurological: Negative for dizziness, light-headedness and headaches.    Immunization History  Administered Date(s) Administered  . Influenza, High Dose Seasonal PF 07/16/2017, 07/20/2018  . Influenza,inj,Quad PF,6+ Mos 06/11/2013, 06/30/2014, 07/17/2015, 06/27/2016  . Influenza-Unspecified 07/07/2012  . Pneumococcal Conjugate-13 11/07/2014  . Pneumococcal Polysaccharide-23 10/08/2007  . Zoster Recombinat (Shingrix) 07/31/2017, 12/09/2017   Pertinent  Health Maintenance Due  Topic Date Due  . INFLUENZA VACCINE  05/08/2019  . MAMMOGRAM  11/19/2019  . DEXA SCAN  Completed  . PNA vac Low Risk Adult  Completed   Fall Risk  04/14/2019 04/13/2019 01/21/2019 09/14/2018 07/31/2018  Falls in the past year? 0 0 0 0 Yes  Number falls in past yr: - 0 0 0 1  Injury  with Fall? - 0 0 0 No   There were no vitals filed for this visit. There is no height or weight on file to calculate BMI. Physical Exam Unable to complete on Telephone visit.   Labs reviewed: Recent Labs    09/14/18 1547  NA 142  K 4.1  CL 107  CO2 27  GLUCOSE 92  BUN 18  CREATININE 0.97*  CALCIUM 9.7   Recent Labs    09/14/18 1547  AST 23  ALT 17  BILITOT 0.8  PROT 6.8   Recent Labs    09/14/18 1547  WBC 6.0  NEUTROABS 3,384  HGB 12.2  HCT 36.4  MCV 94.3  PLT 213   Lab Results  Component Value Date   TSH 2.510 06/09/2014   Lab Results  Component Value Date   HGBA1C 5.6 04/22/2016   Lab Results  Component Value Date   CHOL 144 09/14/2018   HDL 75 09/14/2018   LDLCALC 55 09/14/2018   TRIG 61 09/14/2018   CHOLHDL 1.9 09/14/2018    Significant Diagnostic Results in last 30 days:  No results found.  Assessment/Plan 1. Frequent urinary  incontinence Afebrile.New onset of urine incontinence.No abdominal pain or back pain.  Encouraged to limit fluid intake late in the evenings.Avoid coffinated beverages.  - Ambulatory referral to Urology - Urine Culture; Future - Urinalysis with Reflex Microscopic; Future  Family/ staff Communication: Reviewed plan of care with patient.  Labs/tests ordered:  - Urine Culture; Future - Urinalysis with Reflex Microscopic; Future  Next appt: 04/15/2019 for labs .  Time spent with patient 11 minutes >50% time spent counseling; reviewing medical record; tests; labs; and developing future plan of care  Sandrea Hughs, NP

## 2019-04-14 NOTE — Telephone Encounter (Signed)
Will need an office schedule appointment or can discuss with Dr.Reed on her next visit.

## 2019-04-15 ENCOUNTER — Other Ambulatory Visit: Payer: Self-pay

## 2019-04-15 ENCOUNTER — Other Ambulatory Visit: Payer: Medicare Other

## 2019-04-15 DIAGNOSIS — N39498 Other specified urinary incontinence: Secondary | ICD-10-CM | POA: Diagnosis not present

## 2019-04-15 DIAGNOSIS — R252 Cramp and spasm: Secondary | ICD-10-CM | POA: Diagnosis not present

## 2019-04-16 LAB — URINALYSIS, ROUTINE W REFLEX MICROSCOPIC
Bacteria, UA: NONE SEEN /HPF
Bilirubin Urine: NEGATIVE
Glucose, UA: NEGATIVE
Hgb urine dipstick: NEGATIVE
Hyaline Cast: NONE SEEN /LPF
Ketones, ur: NEGATIVE
Nitrite: NEGATIVE
Protein, ur: NEGATIVE
RBC / HPF: NONE SEEN /HPF (ref 0–2)
Specific Gravity, Urine: 1.005 (ref 1.001–1.03)
Squamous Epithelial / HPF: NONE SEEN /HPF (ref <=5)
pH: 5 (ref 5.0–8.0)

## 2019-04-16 LAB — COMPLETE METABOLIC PANEL WITH GFR
AG Ratio: 1.5 (calc) (ref 1.0–2.5)
ALT: 21 U/L (ref 6–29)
AST: 25 U/L (ref 10–35)
Albumin: 4.1 g/dL (ref 3.6–5.1)
Alkaline phosphatase (APISO): 80 U/L (ref 37–153)
BUN: 18 mg/dL (ref 7–25)
CO2: 25 mmol/L (ref 20–32)
Calcium: 9.5 mg/dL (ref 8.6–10.4)
Chloride: 106 mmol/L (ref 98–110)
Creat: 0.88 mg/dL (ref 0.60–0.88)
GFR, Est African American: 66 mL/min/{1.73_m2} (ref >=60)
GFR, Est Non African American: 57 mL/min/{1.73_m2} — ABNORMAL LOW (ref >=60)
Globulin: 2.7 g/dL (calc) (ref 1.9–3.7)
Glucose, Bld: 120 mg/dL — ABNORMAL HIGH (ref 65–99)
Potassium: 4.1 mmol/L (ref 3.5–5.3)
Sodium: 138 mmol/L (ref 135–146)
Total Bilirubin: 0.9 mg/dL (ref 0.2–1.2)
Total Protein: 6.8 g/dL (ref 6.1–8.1)

## 2019-04-16 LAB — CBC WITH DIFFERENTIAL/PLATELET
Absolute Monocytes: 639 cells/uL (ref 200–950)
Basophils Absolute: 19 cells/uL (ref 0–200)
Basophils Relative: 0.3 %
Eosinophils Absolute: 112 cells/uL (ref 15–500)
Eosinophils Relative: 1.8 %
HCT: 35 % (ref 35.0–45.0)
Hemoglobin: 11.7 g/dL (ref 11.7–15.5)
Lymphs Abs: 1910 cells/uL (ref 850–3900)
MCH: 32.1 pg (ref 27.0–33.0)
MCHC: 33.4 g/dL (ref 32.0–36.0)
MCV: 95.9 fL (ref 80.0–100.0)
MPV: 10.7 fL (ref 7.5–12.5)
Monocytes Relative: 10.3 %
Neutro Abs: 3522 cells/uL (ref 1500–7800)
Neutrophils Relative %: 56.8 %
Platelets: 209 10*3/uL (ref 140–400)
RBC: 3.65 10*6/uL — ABNORMAL LOW (ref 3.80–5.10)
RDW: 13.2 % (ref 11.0–15.0)
Total Lymphocyte: 30.8 %
WBC: 6.2 10*3/uL (ref 3.8–10.8)

## 2019-04-16 LAB — MICROSCOPIC MESSAGE

## 2019-04-16 LAB — URINE CULTURE
MICRO NUMBER:: 651107
SPECIMEN QUALITY:: ADEQUATE

## 2019-04-16 LAB — MAGNESIUM: Magnesium: 2.3 mg/dL (ref 1.5–2.5)

## 2019-04-19 DIAGNOSIS — H401124 Primary open-angle glaucoma, left eye, indeterminate stage: Secondary | ICD-10-CM | POA: Diagnosis not present

## 2019-04-19 DIAGNOSIS — H401113 Primary open-angle glaucoma, right eye, severe stage: Secondary | ICD-10-CM | POA: Diagnosis not present

## 2019-04-26 ENCOUNTER — Ambulatory Visit: Payer: Self-pay | Admitting: Family

## 2019-04-26 ENCOUNTER — Other Ambulatory Visit: Payer: Self-pay

## 2019-04-26 ENCOUNTER — Ambulatory Visit (INDEPENDENT_AMBULATORY_CARE_PROVIDER_SITE_OTHER): Payer: Medicare Other | Admitting: Family

## 2019-04-26 ENCOUNTER — Encounter: Payer: Self-pay | Admitting: Family

## 2019-04-26 VITALS — BP 142/78 | HR 58 | Temp 98.4°F | Ht 62.0 in | Wt 135.6 lb

## 2019-04-26 DIAGNOSIS — T148XXA Other injury of unspecified body region, initial encounter: Secondary | ICD-10-CM

## 2019-04-26 NOTE — Patient Instructions (Signed)
Continue to monitor right arm bruise Notify provider's office if bruise is worsening or does not resolve in a week,having any pain or itching on area. Also if running any fever,redness,swelling or drainage.

## 2019-04-26 NOTE — Progress Notes (Signed)
Provider: Dinah Ngetich FNP-C  Gayland Curry, DO  Patient Care Team: Gayland Curry, DO as PCP - General (Geriatric Medicine) Sueanne Margarita, MD as PCP - Cardiology (Cardiology) Trula Slade, DPM as Consulting Physician (Podiatry)  Extended Emergency Contact Information Primary Emergency Contact: Blattner,Carl          [place] [address] [place] Phone: [phone] Mobile Phone: [phone] Relation: Son Secondary Emergency Contact: Stormy Fabian [place] Phone: [phone] Mobile Phone: [phone] Relation: Daughter  Code Status:  Goals of care: Advanced Directive information Advanced Directives 04/26/2019  Does Patient Have a Medical Advance Directive? Yes  Type of Advance Directive Out of facility DNR (pink MOST or yellow form)  Does patient want to make changes to medical advance directive? No - Patient declined  Copy of [place] in Chart? -  Would patient like information on creating a medical advance directive? No - Patient declined  Pre-existing out of facility DNR order (yellow form or pink MOST form) Yellow form placed in chart (order not valid for inpatient use)     Chief Complaint  Patient presents with  . Acute Visit    spot on right arm, no pain, does not itich  appeared 7/19    HPI:  Pt is a 85 y.o. female seen today for an acute visit for evaluation of right forearm bruise like area x 1 days.she denies any fever,chills,pain or itching.she does not recall any insect bite to area.Also does not recall any injury to area.she is currently on Plavix 75 mg tablet daily.she states no other spot anywhere else.    Past Medical History:  Diagnosis Date  . Anxiety   . Asthma   . Benign essential hypertension   . Bradycardia, drug induced 06/11/2016  . Edema extremities   . GERD (gastroesophageal reflux disease)   . Glaucoma   . Macular degeneration    Per patient   . Osteoarthritis   .  PVD (peripheral vascular disease) ([place])   . Seasonal allergies   . Stroke [facility])    Past Surgical History:  Procedure Laterality Date  . ABDOMINAL HYSTERECTOMY  1981  . TONSILLECTOMY  1953    Allergies  Allergen Reactions  . Aspirin Other (See Comments)    sweats  . Codeine Other (See Comments)  . Iodine Other (See Comments)    Pt is not aware of this allergy, does not recall much info.  . Prednisone Other (See Comments)    sweats  . Lyrica [Pregabalin]     Wobbly, "more than sleepy"  . Benzonatate Itching and Rash  . Sulfa Antibiotics Other (See Comments)    Doesn't remember    Outpatient Encounter Medications as of 04/26/2019  Medication Sig  . acetaminophen (TYLENOL) 500 MG tablet Take 500 mg by mouth every 6 (six) hours as needed.  Marland Kitchen albuterol (PROVENTIL HFA;VENTOLIN HFA) 108 (90 Base) MCG/ACT inhaler Inhale 2 puffs into the lungs every 6 (six) hours as needed for wheezing.  Marland Kitchen alum hydroxide-mag trisilicate (GAVISCON) 63-01 MG CHEW chewable tablet Chew by mouth daily.   Marland Kitchen amLODipine (NORVASC) 5 MG tablet TAKE 1 TABLET BY MOUTH EVERY DAY  . atorvastatin (LIPITOR) 10 MG tablet TAKE 1 TABLET BY MOUTH EVERY DAY  . B Complex Vitamins (B COMPLEX PO) Take 1 tablet by mouth daily.  . Brinzolamide-Brimonidine (SIMBRINZA) 1-0.2 % SUSP Apply 1 drop to eye 3 (three) times daily.  . budesonide-formoterol (SYMBICORT) 80-4.5 MCG/ACT inhaler Inhale  2 puffs into the lungs 2 (two) times daily.  . Calcium Carbonate-Vitamin D (CALTRATE 600+D) 600-400 MG-UNIT per tablet Take 2 tablets by mouth daily.  . Cholecalciferol (VITAMIN D3) 2000 units TABS Take 2,000 Units by mouth daily.  . clopidogrel (PLAVIX) 75 MG tablet TAKE 1 TABLET (75 MG TOTAL) BY MOUTH DAILY.  Marland Kitchen Coenzyme Q10 (COQ10) 50 MG CAPS Take 50 mg by mouth daily.  . fluticasone (FLONASE) 50 MCG/ACT nasal spray Place 1 spray into the nose daily.  Marland Kitchen loratadine (CLARITIN) 5 MG chewable tablet Chew 1 tablet (5 mg total) by mouth daily.  .  Menthol, Topical Analgesic, (BIOFREEZE) 4 % GEL Apply 1 application topically 3 (three) times daily. Apply to knee  . Multiple Vitamins-Minerals (SENIOR MULTIVITAMIN PLUS) TABS Take 1 tablet by mouth daily.   . NONFORMULARY OR COMPOUNDED ITEM [facility]:  Peripheral Neuropathy Cream - Bupivacaine 1%, Doxepin 3%, Gabapentin 6%, Pentoxifylline 3%, Topiramate 1%, apply 1-2 grams to affected areas 3-4 times daily prn.  . Omega-3 Fatty Acids (FISH OIL) 1200 MG CPDR Take 1,200 mg by mouth daily. Take one tablet by mouth once daily   . pantoprazole (PROTONIX) 40 MG tablet Take 1 tablet (40 mg total) by mouth 2 (two) times daily before a meal.  . PREDNISOLONE ACETATE OP Place 1 drop into the right eye 3 (three) times daily.  . risperiDONE (RISPERDAL) 0.5 MG tablet TAKE 1 TABLET BY MOUTH TWICE A DAY  . travoprost, benzalkonium, (TRAVATAN) 0.004 % ophthalmic solution Place 1 drop into the left eye at bedtime.  . TROPICAMIDE OP Place 1 drop into the right eye daily.  . vitamin C (ASCORBIC ACID) 500 MG tablet Take 500 mg by mouth daily.   No facility-administered encounter medications on file as of 04/26/2019.     Review of Systems  Constitutional: Negative for appetite change, chills, fatigue and fever.  HENT: Negative for congestion, rhinorrhea, sinus pressure, sinus pain, sneezing and sore throat.   Eyes: Positive for visual disturbance. Negative for discharge, redness and itching.       Wears eye glasses   Respiratory: Negative for cough, chest tightness, shortness of breath and wheezing.   Cardiovascular: Negative for chest pain, palpitations and leg swelling.  Gastrointestinal: Negative for abdominal distention, abdominal pain, constipation, diarrhea, nausea and vomiting.  Genitourinary: Negative for decreased urine volume and hematuria.  Musculoskeletal: Positive for gait problem. Negative for myalgias.  Skin: Negative for color change, pallor and rash.       Right forearm "spot bruise  like"   Neurological: Negative for dizziness, light-headedness and headaches.  Hematological: Does not bruise/bleed easily.  Psychiatric/Behavioral: Negative for agitation, confusion and sleep disturbance. The patient is not nervous/anxious.     Immunization History  Administered Date(s) Administered  . Influenza, High Dose Seasonal PF 07/16/2017, 07/20/2018  . Influenza,inj,Quad PF,6+ Mos 06/11/2013, 06/30/2014, 07/17/2015, 06/27/2016  . Influenza-Unspecified 07/07/2012  . Pneumococcal Conjugate-13 11/07/2014  . Pneumococcal Polysaccharide-23 10/08/2007  . Zoster Recombinat (Shingrix) 07/31/2017, 12/09/2017   Pertinent  Health Maintenance Due  Topic Date Due  . INFLUENZA VACCINE  05/08/2019  . MAMMOGRAM  11/19/2019  . DEXA SCAN  Completed  . PNA vac Low Risk Adult  Completed   Fall Risk  04/26/2019 04/14/2019 04/13/2019 01/21/2019 09/14/2018  Falls in the past year? 0 0 0 0 0  Number falls in past yr: 0 - 0 0 0  Injury with Fall? 0 - 0 0 0    Vitals:   04/26/19 1304  BP: Marland Kitchen)  142/78  Pulse: (!) 58  Temp: 98.4 F (36.9 C)  SpO2: 98%  Weight: 135 lb 9.6 oz (61.5 kg)  Height: 5\' 2"  (1.575 m)   Body mass index is 24.8 kg/m. Physical Exam Constitutional:      General: She is not in acute distress.    Appearance: She is normal weight. She is not ill-appearing.  HENT:     Head: Normocephalic.     Mouth/Throat:     Mouth: Mucous membranes are moist.     Pharynx: Oropharynx is clear. No oropharyngeal exudate or posterior oropharyngeal erythema.  Eyes:     General: No scleral icterus.       Right eye: No discharge.        Left eye: No discharge.     Conjunctiva/sclera: Conjunctivae normal.  Neck:     Musculoskeletal: Normal range of motion. No neck rigidity or muscular tenderness.  Cardiovascular:     Rate and Rhythm: Normal rate and regular rhythm.     Pulses: Normal pulses.     Heart sounds: Murmur present. No friction rub. No gallop.   Pulmonary:     Effort: Pulmonary  effort is normal. No respiratory distress.     Breath sounds: Normal breath sounds. No wheezing, rhonchi or rales.  Chest:     Chest wall: No tenderness.  Abdominal:     General: Bowel sounds are normal. There is no distension.     Palpations: Abdomen is soft. There is no mass.     Tenderness: There is no abdominal tenderness. There is no right CVA tenderness, left CVA tenderness, guarding or rebound.  Musculoskeletal:        General: No tenderness.     Right lower leg: No edema.     Left lower leg: No edema.     Comments: Unsteady gait walks with a cane   Lymphadenopathy:     Cervical: No cervical adenopathy.  Skin:    General: Skin is warm and dry.     Coloration: Skin is not pale.     Findings: No erythema, lesion or rash.     Comments: Right forearm anterior 1.5" x 1" circular dark purple colored area,not raised, non-tender to touch with small hard area noted in the middle.surrounding skin tissue without any skin redness.No drainage noted.   Neurological:     Mental Status: She is alert. Mental status is at baseline.     Cranial Nerves: No cranial nerve deficit.     Sensory: No sensory deficit.     Motor: No weakness.     Gait: Gait abnormal.  Psychiatric:        Mood and Affect: Mood normal.        Behavior: Behavior normal.        Thought Content: Thought content normal.   Labs reviewed: Recent Labs    09/14/18 1547 04/15/19 1455  NA 142 138  K 4.1 4.1  CL 107 106  CO2 27 25  GLUCOSE 92 120*  BUN 18 18  CREATININE 0.97* 0.88  CALCIUM 9.7 9.5  MG  --  2.3   Recent Labs    09/14/18 1547 04/15/19 1455  AST 23 25  ALT 17 21  BILITOT 0.8 0.9  PROT 6.8 6.8   Recent Labs    09/14/18 1547 04/15/19 1455  WBC 6.0 6.2  NEUTROABS 3,384 3,522  HGB 12.2 11.7  HCT 36.4 35.0  MCV 94.3 95.9  PLT 213 209   Lab Results  Component  Value Date   TSH 2.510 06/09/2014   Lab Results  Component Value Date   HGBA1C 5.6 04/22/2016   Lab Results  Component Value  Date   CHOL 144 09/14/2018   HDL 75 09/14/2018   LDLCALC 55 09/14/2018   TRIG 61 09/14/2018   CHOLHDL 1.9 09/14/2018    Significant Diagnostic Results in last 30 days:  No results found.  Assessment/Plan   Bruise Dark-purplish in color non-tender to touch and without any signs of infections.unclear etiology though suspect possible injury or unlikely insect bite.seems to be resolving.patient on Plavix also easily bruising.Patient reassured.will continue to monitor for now.Notify provider's office if bruise is worsening or does not resolve in a week,having any pain or itching on area. Also if running any fever,redness,swelling or drainage.   Family/ staff Communication: Reviewed plan of care with patient.   Labs/tests ordered: None   Dinah C Ngetich, NP

## 2019-04-27 ENCOUNTER — Ambulatory Visit: Payer: Medicare Other | Admitting: Family

## 2019-05-27 ENCOUNTER — Ambulatory Visit (INDEPENDENT_AMBULATORY_CARE_PROVIDER_SITE_OTHER): Payer: Medicare Other | Admitting: Internal Medicine

## 2019-05-27 ENCOUNTER — Other Ambulatory Visit: Payer: Self-pay

## 2019-05-27 ENCOUNTER — Encounter: Payer: Self-pay | Admitting: Internal Medicine

## 2019-05-27 ENCOUNTER — Telehealth: Payer: Self-pay

## 2019-05-27 DIAGNOSIS — I739 Peripheral vascular disease, unspecified: Secondary | ICD-10-CM

## 2019-05-27 DIAGNOSIS — M170 Bilateral primary osteoarthritis of knee: Secondary | ICD-10-CM | POA: Diagnosis not present

## 2019-05-27 DIAGNOSIS — H3530 Unspecified macular degeneration: Secondary | ICD-10-CM

## 2019-05-27 DIAGNOSIS — J4520 Mild intermittent asthma, uncomplicated: Secondary | ICD-10-CM | POA: Diagnosis not present

## 2019-05-27 DIAGNOSIS — R252 Cramp and spasm: Secondary | ICD-10-CM | POA: Insufficient documentation

## 2019-05-27 DIAGNOSIS — K219 Gastro-esophageal reflux disease without esophagitis: Secondary | ICD-10-CM

## 2019-05-27 NOTE — Telephone Encounter (Signed)
Patients daughter called to say we had scheduled her mother to meet with an eye specialist but she already has one and she doesn't think it would be a good idea for her mother to switch now as she is under this physicians care ([place] )

## 2019-05-27 NOTE — Progress Notes (Signed)
[name], female   DOB: 11/20/1923, 85 y.o.   MRN: [id number]  This service is provided via telemedicine  No vital signs collected/recorded due to the encounter was a telemedicine visit.   Location of patient (ex: home, work):  Home  Patient consents to a telephone visit:  YES  Location of the provider (ex: office, home):  OFFICE  Name of any referring provider:  DR [facility] Kage Willmann, DO  Names of all persons participating in the telemedicine service and their role in the encounter:  PATIENT, [name] [name], [name] [name], DR Jonelle Sidle [name] Lasseigne, DO  Time spent on call:  3:50    Provider:  Adiya Selmer [name], D.O., C.M.D.  Code Status: DNR Goals of Care:  Advanced Directives 04/26/2019  Does Patient Have a Medical Advance Directive? Yes  Type of Advance Directive Out of facility DNR (pink MOST or yellow form)  Does patient want to make changes to medical advance directive? No - Patient declined  Copy of [place] in Chart? -  Would patient like information on creating a medical advance directive? No - Patient declined  Pre-existing out of facility DNR order (yellow form or pink MOST form) Yellow form placed in chart (order not valid for inpatient use)     Chief Complaint  Patient presents with  . Medical Management of Chronic Issues    4MTH FOLLOW-UP    HPI: Patient is a 85 y.o. female seen today for medical management of chronic diseases.    Nothing is new with her.  She had eye surgery 3/31 and she's been up and down at first.  Follows up next in October.    She was doing good while she could do her exercises.  She's been able to exercise again since July.  She was seen for a bruise on her right arm 7/20.    Knees give her a fit and she has to wear the knee sleeves all the time or she feels like she'll fall.  Appetite good, sleep good, bowels good.  She's doing well with her pain in her side that we thought was flatus or gerd-related.  Not having trouble  with that lately.  Her gaviscon is working--likely liquid better than pills.    She drinks plenty of water with her medicines and vitamins.    Breathing is the same.  Does her usual inhaler of symbicort bid.  That does the trick.  No need to use rescue inhaler.  She has not had any symptoms to make her think she needs it.    She was having cramps in the back of her legs.  Dinah saw her about it. She'd already been taking mustard and still having cramps.    Past Medical History:  Diagnosis Date  . Anxiety   . Asthma   . Benign essential hypertension   . Bradycardia, drug induced 06/11/2016  . Edema extremities   . GERD (gastroesophageal reflux disease)   . Glaucoma   . Macular degeneration    Per patient   . Osteoarthritis   . PVD (peripheral vascular disease) ([place])   . Seasonal allergies   . Stroke [facility])     Past Surgical History:  Procedure Laterality Date  . ABDOMINAL HYSTERECTOMY  1981  . TONSILLECTOMY  1953    Allergies  Allergen Reactions  . Aspirin Other (See Comments)    sweats  . Codeine Other (See Comments)  . Iodine Other (See Comments)    Pt is not aware  of this allergy, does not recall much info.  . Prednisone Other (See Comments)    sweats  . Lyrica [Pregabalin]     Wobbly, "more than sleepy"  . Benzonatate Itching and Rash  . Sulfa Antibiotics Other (See Comments)    Doesn't remember    Outpatient Encounter Medications as of 05/27/2019  Medication Sig  . acetaminophen (TYLENOL) 500 MG tablet Take 500 mg by mouth every 6 (six) hours as needed.  Marland Kitchen albuterol (PROVENTIL HFA;VENTOLIN HFA) 108 (90 Base) MCG/ACT inhaler Inhale 2 puffs into the lungs every 6 (six) hours as needed for wheezing.  Marland Kitchen alum hydroxide-mag trisilicate (GAVISCON) 25-85 MG CHEW chewable tablet Chew by mouth daily.   Marland Kitchen amLODipine (NORVASC) 5 MG tablet TAKE 1 TABLET BY MOUTH EVERY DAY  . atorvastatin (LIPITOR) 10 MG tablet TAKE 1 TABLET BY MOUTH EVERY DAY  . B Complex Vitamins (B COMPLEX  PO) Take 1 tablet by mouth daily.  . Brinzolamide-Brimonidine (SIMBRINZA) 1-0.2 % SUSP Apply 1 drop to eye 3 (three) times daily.  . budesonide-formoterol (SYMBICORT) 80-4.5 MCG/ACT inhaler Inhale 2 puffs into the lungs 2 (two) times daily.  . Calcium Carbonate-Vitamin D (CALTRATE 600+D) 600-400 MG-UNIT per tablet Take 2 tablets by mouth daily.  . Cholecalciferol (VITAMIN D3) 2000 units TABS Take 2,000 Units by mouth daily.  . clopidogrel (PLAVIX) 75 MG tablet TAKE 1 TABLET (75 MG TOTAL) BY MOUTH DAILY.  Marland Kitchen Coenzyme Q10 (COQ10) 50 MG CAPS Take 50 mg by mouth daily.  . fluticasone (FLONASE) 50 MCG/ACT nasal spray Place 1 spray into the nose daily.  Marland Kitchen loratadine (CLARITIN) 5 MG chewable tablet Chew 1 tablet (5 mg total) by mouth daily.  . Menthol, Topical Analgesic, (BIOFREEZE) 4 % GEL Apply 1 application topically 3 (three) times daily. Apply to knee  . Multiple Vitamins-Minerals (SENIOR MULTIVITAMIN PLUS) TABS Take 1 tablet by mouth daily.   . NONFORMULARY OR COMPOUNDED ITEM [facility]:  Peripheral Neuropathy Cream - Bupivacaine 1%, Doxepin 3%, Gabapentin 6%, Pentoxifylline 3%, Topiramate 1%, apply 1-2 grams to affected areas 3-4 times daily prn.  . Omega-3 Fatty Acids (FISH OIL) 1200 MG CPDR Take 1,200 mg by mouth daily. Take one tablet by mouth once daily   . pantoprazole (PROTONIX) 40 MG tablet Take 1 tablet (40 mg total) by mouth 2 (two) times daily before a meal.  . PREDNISOLONE ACETATE OP Place 1 drop into the right eye 3 (three) times daily.  . risperiDONE (RISPERDAL) 0.5 MG tablet TAKE 1 TABLET BY MOUTH TWICE A DAY  . travoprost, benzalkonium, (TRAVATAN) 0.004 % ophthalmic solution Place 1 drop into the left eye at bedtime.  . TROPICAMIDE OP Place 1 drop into the right eye daily.  . vitamin C (ASCORBIC ACID) 500 MG tablet Take 500 mg by mouth daily.   No facility-administered encounter medications on file as of 05/27/2019.     Review of Systems:  Review of Systems   Constitutional: Negative for chills, fever and malaise/fatigue.  HENT: Positive for hearing loss.   Eyes: Positive for blurred vision.  Respiratory: Negative for cough and shortness of breath.   Cardiovascular: Negative for chest pain, palpitations and leg swelling.  Gastrointestinal: Positive for heartburn. Negative for abdominal pain, blood in stool, constipation, diarrhea and melena.       Controlled right now  Genitourinary: Negative for dysuria.  Musculoskeletal: Positive for joint pain. Negative for falls.  Skin: Negative for rash.  Neurological: Negative for dizziness and loss of consciousness.  Endo/Heme/Allergies: Bruises/bleeds easily.  Bruise right arm resolved  Psychiatric/Behavioral: Positive for memory loss. Negative for depression. The patient is not nervous/anxious and does not have insomnia.     Health Maintenance  Topic Date Due  . TETANUS/TDAP  10/04/1943  . INFLUENZA VACCINE  05/08/2019  . MAMMOGRAM  11/19/2019  . DEXA SCAN  Completed  . PNA vac Low Risk Adult  Completed    Physical Exam: Could not be performed as visit non face-to-face via phone  Labs reviewed: Basic Metabolic Panel: Recent Labs    09/14/18 1547 04/15/19 1455  NA 142 138  K 4.1 4.1  CL 107 106  CO2 27 25  GLUCOSE 92 120*  BUN 18 18  CREATININE 0.97* 0.88  CALCIUM 9.7 9.5  MG  --  2.3   Liver Function Tests: Recent Labs    09/14/18 1547 04/15/19 1455  AST 23 25  ALT 17 21  BILITOT 0.8 0.9  PROT 6.8 6.8   No results for input(s): LIPASE, AMYLASE in the last 8760 hours. No results for input(s): AMMONIA in the last 8760 hours. CBC: Recent Labs    09/14/18 1547 04/15/19 1455  WBC 6.0 6.2  NEUTROABS 3,384 3,522  HGB 12.2 11.7  HCT 36.4 35.0  MCV 94.3 95.9  PLT 213 209   Lipid Panel: Recent Labs    09/14/18 1547  CHOL 144  HDL 75  LDLCALC 55  TRIG 61  CHOLHDL 1.9   Lab Results  Component Value Date   HGBA1C 5.6 04/22/2016   Assessment/Plan 1. PVD  (peripheral vascular disease) ([place]) -may be a contributor to her cramping in her legs at times -takes mustard for symptoms but not always successful -monitor  2. Mild intermittent asthma without complication -well controlled w/o need for rescue inhaler, cont symbicort  3. Gastroesophageal reflux disease without esophagitis -well controlled lately with her protonix and gaviscon  4. Macular degeneration of left eye, unspecified type -s/p surgery, up and down from this perspective  5. Leg cramps -suspect PAD related vs neurologic; monitor  6. Bilateral primary osteoarthritis of knee -doing well with biofreeze, tylenol so continue current regimen along with knee sleeves for support  Labs/tests ordered:  No new Next appt:  05/27/2019 Non face-to-face time spent on televisit:  27 mins  Elmina Hendel L. Stratton Villwock, D.O. [facility] [address], [place] [address] Cell Phone (Mon-Fri 8am-5pm):  [phone] On Call:  [phone] & follow prompts after 5pm & weekends Office Phone:  [phone] Office Fax:  [phone]

## 2019-05-31 ENCOUNTER — Encounter (INDEPENDENT_AMBULATORY_CARE_PROVIDER_SITE_OTHER): Payer: Medicare Other | Admitting: Ophthalmology

## 2019-07-06 ENCOUNTER — Ambulatory Visit (INDEPENDENT_AMBULATORY_CARE_PROVIDER_SITE_OTHER): Payer: Medicare Other | Admitting: Podiatry

## 2019-07-06 ENCOUNTER — Other Ambulatory Visit: Payer: Self-pay

## 2019-07-06 ENCOUNTER — Encounter: Payer: Self-pay | Admitting: Podiatry

## 2019-07-06 DIAGNOSIS — G5793 Unspecified mononeuropathy of bilateral lower limbs: Secondary | ICD-10-CM | POA: Diagnosis not present

## 2019-07-06 DIAGNOSIS — M79675 Pain in left toe(s): Secondary | ICD-10-CM

## 2019-07-06 DIAGNOSIS — L84 Corns and callosities: Secondary | ICD-10-CM | POA: Diagnosis not present

## 2019-07-06 DIAGNOSIS — B351 Tinea unguium: Secondary | ICD-10-CM

## 2019-07-06 DIAGNOSIS — M79674 Pain in right toe(s): Secondary | ICD-10-CM

## 2019-07-06 NOTE — Patient Instructions (Signed)
Corns and Calluses Corns are small areas of thickened skin that occur on the top, sides, or tip of a toe. They contain a cone-shaped core with a point that can press on a nerve below. This causes pain.  Calluses are areas of thickened skin that can occur anywhere on the body, including the hands, fingers, palms, soles of the feet, and heels. Calluses are usually larger than corns. What are the causes? Corns and calluses are caused by rubbing (friction) or pressure, such as from shoes that are too tight or do not fit properly. What increases the risk? Corns are more likely to develop in people who have misshapen toes (toe deformities), such as hammer toes. Calluses can occur with friction to any area of the skin. They are more likely to develop in people who:  Work with their hands.  Wear shoes that fit poorly, are too tight, or are high-heeled.  Have toe deformities. What are the signs or symptoms? Symptoms of a corn or callus include:  A hard growth on the skin.  Pain or tenderness under the skin.  Redness and swelling.  Increased discomfort while wearing tight-fitting shoes, if your feet are affected. If a corn or callus becomes infected, symptoms may include:  Redness and swelling that gets worse.  Pain.  Fluid, blood, or pus draining from the corn or callus. How is this diagnosed? Corns and calluses may be diagnosed based on your symptoms, your medical history, and a physical exam. How is this treated? Treatment for corns and calluses may include:  Removing the cause of the friction or pressure. This may involve: ? Changing your shoes. ? Wearing shoe inserts (orthotics) or other protective layers in your shoes, such as a corn pad. ? Wearing gloves.  Applying medicine to the skin (topical medicine) to help soften skin in the hardened, thickened areas.  Removing layers of dead skin with a file to reduce the size of the corn or callus.  Removing the corn or callus with a  scalpel or laser.  Taking antibiotic medicines, if your corn or callus is infected.  Having surgery, if a toe deformity is the cause. Follow these instructions at home:   Take over-the-counter and prescription medicines only as told by your health care provider.  If you were prescribed an antibiotic, take it as told by your health care provider. Do not stop taking it even if your condition starts to improve.  Wear shoes that fit well. Avoid wearing high-heeled shoes and shoes that are too tight or too loose.  Wear any padding, protective layers, gloves, or orthotics as told by your health care provider.  Soak your hands or feet and then use a file or pumice stone to soften your corn or callus. Do this as told by your health care provider.  Check your corn or callus every day for symptoms of infection. Contact a health care provider if you:  Notice that your symptoms do not improve with treatment.  Have redness or swelling that gets worse.  Notice that your corn or callus becomes painful.  Have fluid, blood, or pus coming from your corn or callus.  Have new symptoms. Summary  Corns are small areas of thickened skin that occur on the top, sides, or tip of a toe.  Calluses are areas of thickened skin that can occur anywhere on the body, including the hands, fingers, palms, and soles of the feet. Calluses are usually larger than corns.  Corns and calluses are caused by   rubbing (friction) or pressure, such as from shoes that are too tight or do not fit properly.  Treatment may include wearing any padding, protective layers, gloves, or orthotics as told by your health care provider. This information is not intended to replace advice given to you by your health care provider. Make sure you discuss any questions you have with your health care provider. Document Released: 06/29/2004 Document Revised: 01/13/2019 Document Reviewed: 08/06/2017 Elsevier Patient Education  2020 [employer]vier  Inc.   Onychomycosis/Fungal Toenails  WHAT IS IT? An infection that lies within the keratin of your nail plate that is caused by a fungus.  WHY ME? Fungal infections affect all ages, sexes, races, and creeds.  There may be many factors that predispose you to a fungal infection such as age, coexisting medical conditions such as diabetes, or an autoimmune disease; stress, medications, fatigue, genetics, etc.  Bottom line: fungus thrives in a warm, moist environment and your shoes offer such a location.  IS IT CONTAGIOUS? Theoretically, yes.  You do not want to share shoes, nail clippers or files with someone who has fungal toenails.  Walking around barefoot in the same room or sleeping in the same bed is unlikely to transfer the organism.  It is important to realize, however, that fungus can spread easily from one nail to the next on the same foot.  HOW DO WE TREAT THIS?  There are several ways to treat this condition.  Treatment may depend on many factors such as age, medications, pregnancy, liver and kidney conditions, etc.  It is best to ask your doctor which options are available to you.  1. No treatment.   Unlike many other medical concerns, you can live with this condition.  However for many people this can be a painful condition and may lead to ingrown toenails or a bacterial infection.  It is recommended that you keep the nails cut short to help reduce the amount of fungal nail. 2. Topical treatment.  These range from herbal remedies to prescription strength nail lacquers.  About 40-50% effective, topicals require twice daily application for approximately 9 to 12 months or until an entirely new nail has grown out.  The most effective topicals are medical grade medications available through physicians offices. 3. Oral antifungal medications.  With an 80-90% cure rate, the most common oral medication requires 3 to 4 months of therapy and stays in your system for a year as the new nail grows out.   Oral antifungal medications do require blood work to make sure it is a safe drug for you.  A liver function panel will be performed prior to starting the medication and after the first month of treatment.  It is important to have the blood work performed to avoid any harmful side effects.  In general, this medication safe but blood work is required. 4. Laser Therapy.  This treatment is performed by applying a specialized laser to the affected nail plate.  This therapy is noninvasive, fast, and non-painful.  It is not covered by insurance and is therefore, out of pocket.  The results have been very good with a 80-95% cure rate.  [facility] is the only practice in the area to offer this therapy. 5. Permanent Nail Avulsion.  Removing the entire nail so that a new nail will not grow back. 

## 2019-07-07 NOTE — Progress Notes (Signed)
Subjective:  [name] [name] presents to clinic today with cc of  painful, thick, discolored, elongated toenails 1-5 b/l that become tender and cannot cut because of thickness.  Pain is aggravated when wearing enclosed shoe gear.   Current Outpatient Medications on File Prior to Visit  Medication Sig Dispense Refill  . acetaminophen (TYLENOL) 500 MG tablet Take 500 mg by mouth every 6 (six) hours as needed.    Marland Kitchen albuterol (PROVENTIL HFA;VENTOLIN HFA) 108 (90 Base) MCG/ACT inhaler Inhale 2 puffs into the lungs every 6 (six) hours as needed for wheezing. 1 Inhaler 5  . alum hydroxide-mag trisilicate (GAVISCON) [id number] MG CHEW chewable tablet Chew by mouth daily.     Marland Kitchen amLODipine (NORVASC) 5 MG tablet TAKE 1 TABLET BY MOUTH EVERY DAY 90 tablet 3  . atorvastatin (LIPITOR) 10 MG tablet TAKE 1 TABLET BY MOUTH EVERY DAY 90 tablet 1  . atropine 1 % ophthalmic solution PLACE 1 DROP INTO THE RIGHT EYE DAILY.    . B Complex Vitamins (B COMPLEX PO) Take 1 tablet by mouth daily.    . Brinzolamide-Brimonidine (SIMBRINZA) 1-0.2 % SUSP Apply 1 drop to eye 3 (three) times daily.    . budesonide-formoterol (SYMBICORT) 80-4.5 MCG/ACT inhaler Inhale 2 puffs into the lungs 2 (two) times daily.    . Calcium Carbonate-Vitamin D (CALTRATE 600+D) 600-400 MG-UNIT per tablet Take 2 tablets by mouth daily.    . Cholecalciferol (VITAMIN D3) 2000 units TABS Take 2,000 Units by mouth daily.    . clopidogrel (PLAVIX) 75 MG tablet TAKE 1 TABLET (75 MG TOTAL) BY MOUTH DAILY. 90 tablet 1  . Coenzyme Q10 (COQ10) 50 MG CAPS Take 50 mg by mouth daily.    . fluticasone (FLONASE) 50 MCG/ACT nasal spray Place 1 spray into the nose daily.    Marland Kitchen loratadine (CLARITIN) 5 MG chewable tablet Chew 1 tablet (5 mg total) by mouth daily. 30 tablet 6  . Menthol, Topical Analgesic, (BIOFREEZE) 4 % GEL Apply 1 application topically 3 (three) times daily. Apply to knee 118 mL 3  . Multiple Vitamins-Minerals (SENIOR MULTIVITAMIN PLUS) TABS Take 1  tablet by mouth daily.     . NONFORMULARY OR COMPOUNDED ITEM [facility]:  Peripheral Neuropathy Cream - Bupivacaine 1%, Doxepin 3%, Gabapentin 6%, Pentoxifylline 3%, Topiramate 1%, apply 1-2 grams to affected areas 3-4 times daily prn. 100 each 5  . Omega-3 Fatty Acids (FISH OIL) 1200 MG CPDR Take 1,200 mg by mouth daily. Take one tablet by mouth once daily     . pantoprazole (PROTONIX) 40 MG tablet Take 1 tablet (40 mg total) by mouth 2 (two) times daily before a meal. 60 tablet 11  . PREDNISOLONE ACETATE OP Place 1 drop into the right eye 3 (three) times daily.    . risperiDONE (RISPERDAL) 0.5 MG tablet TAKE 1 TABLET BY MOUTH TWICE A DAY 120 tablet 1  . travoprost, benzalkonium, (TRAVATAN) 0.004 % ophthalmic solution Place 1 drop into the left eye at bedtime.    . TROPICAMIDE OP Place 1 drop into the right eye daily.    . vitamin C (ASCORBIC ACID) 500 MG tablet Take 500 mg by mouth daily.     No current facility-administered medications on file prior to visit.      Allergies  Allergen Reactions  . Aspirin Other (See Comments)    sweats  . Codeine Other (See Comments)  . Iodine Other (See Comments)    Pt is not aware of this allergy, does not recall  much info.  . Prednisone Other (See Comments)    sweats  . Lyrica [Pregabalin]     Wobbly, "more than sleepy"  . Benzonatate Itching and Rash  . Sulfa Antibiotics Other (See Comments)    Doesn't remember     Objective: Physical Examination:  Vascular Examination: Capillary refill time <3 seconds x 10 digits.  Palpable DP/PT pulses b/l.  Digital hair absent b/l   No edema noted b/l.  Skin temperature gradient WNL b/l.  Dermatological Examination: Skin with normal turgor, texture and tone b/l.  No open wounds b/l.  No interdigital macerations noted b/l.  Elongated, thick, discolored brittle toenails with subungual debris and pain on dorsal palpation of nailbeds 1-5 b/l.  Hyperkeratotic lesion noted PIPJ left 4th  digit.  Musculoskeletal Examination: Muscle strength 5/5 to all muscle groups b/l  No pain, crepitus or joint discomfort with active/passive ROM.  Neurological Examination: Sensation intact 5/5 b/l with 10 gram monofilament b/l   Vibratory sensation intact b/l.  Proprioceptive sensation intact b/l.  Assessment: Mycotic nail infection with pain 1-5 b/l Corn left 4th digit Neuropathy  Plan: 1. Toenails 1-5 b/l were debrided in length and girth without iatrogenic laceration. 2. Corn left 4th digit pared utilizing sterile scalpel blade without incident. 2.  Continue soft, supportive shoe gear daily. 3.  Report any pedal injuries to medical professional. 4.  Follow up 3 months. 5.  Patient/POA to call should there be a question/concern in there interim.

## 2019-07-26 DIAGNOSIS — H401124 Primary open-angle glaucoma, left eye, indeterminate stage: Secondary | ICD-10-CM | POA: Diagnosis not present

## 2019-07-26 DIAGNOSIS — H401113 Primary open-angle glaucoma, right eye, severe stage: Secondary | ICD-10-CM | POA: Diagnosis not present

## 2019-08-19 ENCOUNTER — Other Ambulatory Visit: Payer: Self-pay

## 2019-08-19 ENCOUNTER — Ambulatory Visit (INDEPENDENT_AMBULATORY_CARE_PROVIDER_SITE_OTHER): Payer: Medicare Other | Admitting: Internal Medicine

## 2019-08-19 ENCOUNTER — Encounter: Payer: Self-pay | Admitting: Internal Medicine

## 2019-08-19 VITALS — BP 140/78 | HR 60 | Temp 97.7°F | Ht 62.0 in | Wt 138.0 lb

## 2019-08-19 DIAGNOSIS — E785 Hyperlipidemia, unspecified: Secondary | ICD-10-CM | POA: Diagnosis not present

## 2019-08-19 DIAGNOSIS — Z66 Do not resuscitate: Secondary | ICD-10-CM | POA: Diagnosis not present

## 2019-08-19 DIAGNOSIS — Z23 Encounter for immunization: Secondary | ICD-10-CM

## 2019-08-19 DIAGNOSIS — K5904 Chronic idiopathic constipation: Secondary | ICD-10-CM | POA: Diagnosis not present

## 2019-08-19 DIAGNOSIS — R1012 Left upper quadrant pain: Secondary | ICD-10-CM | POA: Diagnosis not present

## 2019-08-19 DIAGNOSIS — I679 Cerebrovascular disease, unspecified: Secondary | ICD-10-CM | POA: Diagnosis not present

## 2019-08-19 DIAGNOSIS — H3530 Unspecified macular degeneration: Secondary | ICD-10-CM | POA: Diagnosis not present

## 2019-08-19 DIAGNOSIS — K219 Gastro-esophageal reflux disease without esophagitis: Secondary | ICD-10-CM

## 2019-08-19 MED ORDER — TETANUS-DIPHTH-ACELL PERTUSSIS 5-2.5-18.5 LF-MCG/0.5 IM SUSP: 0.5000 mL | Freq: Once | INTRAMUSCULAR | 0 refills | Status: AC

## 2019-08-19 MED ORDER — PANTOPRAZOLE SODIUM 40 MG PO TBEC: 40.0000 mg | DELAYED_RELEASE_TABLET | Freq: Two times a day (BID) | ORAL | 11 refills | Status: DC

## 2019-08-19 NOTE — Progress Notes (Signed)
Location:  [facility] clinic Provider:  Domanick Cuccia [name], D.O., C.M.D.  Code Status: DNR Goals of Care:  Advanced Directives 08/19/2019  Does Patient Have a Medical Advance Directive? Unable to assess, patient is non-responsive or altered mental status  Type of Advance Directive -  Does patient want to make changes to medical advance directive? -  Copy of [place] in Chart? -  Would patient like information on creating a medical advance directive? -  Pre-existing out of facility DNR order (yellow form or pink MOST form) -     Chief Complaint  Patient presents with  . Acute Visit    Patient complains of abdominal pain/heaviness/fullness  . Immunizations    Tdap order sent to pharmacy    HPI: Patient is a 85 y.o. female with h/o vascular dementia, GERD, chronic constipation and flatus seen today for acute visit for abdominal pain, fullness and heaviness.    Abdominal discomfort started in October a few weeks ago.  Pain went from left side over to navel.  Did not ache, but then itched on that side of her navel.   She started taking miralax a week or so ago.  The balls have dissipated and are flowing.  The discomfort is improving with this also.  She still has been feeling full and bloated until today when she fasted today.    tdap order sent to pharmacy again.    Says the protonix was doing well for her, but she read it could cause hip pain and it did.  She says when she stopped it, her hip stopped hurting.    She is having some trouble with her eye now.  She had surgery and it didn't work well.  She is using the drops.  Vision is worse.  She cannot read now.     Past Medical History:  Diagnosis Date  . Anxiety   . Asthma   . Benign essential hypertension   . Bradycardia, drug induced 06/11/2016  . Edema extremities   . GERD (gastroesophageal reflux disease)   . Glaucoma   . Macular degeneration    Per patient   . Osteoarthritis   . PVD (peripheral vascular  disease) ([place])   . Seasonal allergies   . Stroke [facility])     Past Surgical History:  Procedure Laterality Date  . ABDOMINAL HYSTERECTOMY  1981  . TONSILLECTOMY  1953    Allergies  Allergen Reactions  . Aspirin Other (See Comments)    sweats  . Codeine Other (See Comments)  . Iodine Other (See Comments)    Pt is not aware of this allergy, does not recall much info.  . Prednisone Other (See Comments)    sweats  . Lyrica [Pregabalin]     Wobbly, "more than sleepy"  . Benzonatate Itching and Rash  . Sulfa Antibiotics Other (See Comments)    Doesn't remember    Outpatient Encounter Medications as of 08/19/2019  Medication Sig  . acetaminophen (TYLENOL) 500 MG tablet Take 500 mg by mouth every 6 (six) hours as needed.  Marland Kitchen albuterol (PROVENTIL HFA;VENTOLIN HFA) 108 (90 Base) MCG/ACT inhaler Inhale 2 puffs into the lungs every 6 (six) hours as needed for wheezing.  Marland Kitchen alum hydroxide-mag trisilicate (GAVISCON) [id number] MG CHEW chewable tablet Chew 2 tablets by mouth 2 (two) times daily.   Marland Kitchen amLODipine (NORVASC) 5 MG tablet TAKE 1 TABLET BY MOUTH EVERY DAY  . atorvastatin (LIPITOR) 10 MG tablet TAKE 1 TABLET BY MOUTH EVERY DAY  .  atropine 1 % ophthalmic solution PLACE 1 DROP INTO THE RIGHT EYE DAILY.  . B Complex Vitamins (B COMPLEX PO) Take 1 tablet by mouth daily.  . Brinzolamide-Brimonidine (SIMBRINZA) 1-0.2 % SUSP Apply 1 drop to eye 3 (three) times daily.  . budesonide-formoterol (SYMBICORT) 80-4.5 MCG/ACT inhaler Inhale 2 puffs into the lungs 2 (two) times daily.  . Calcium Carbonate-Vitamin D (CALTRATE 600+D) 600-400 MG-UNIT per tablet Take 2 tablets by mouth daily.  . Cholecalciferol (VITAMIN D3) 2000 units TABS Take 2,000 Units by mouth daily.  . clopidogrel (PLAVIX) 75 MG tablet TAKE 1 TABLET (75 MG TOTAL) BY MOUTH DAILY.  Marland Kitchen Coenzyme Q10 (COQ10) 50 MG CAPS Take 50 mg by mouth daily.  . fluticasone (FLONASE) 50 MCG/ACT nasal spray Place 1 spray into the nose daily.  Marland Kitchen loratadine  (CLARITIN) 5 MG chewable tablet Chew 1 tablet (5 mg total) by mouth daily.  . Menthol, Topical Analgesic, (BIOFREEZE) 4 % GEL Apply 1 application topically 3 (three) times daily. Apply to knee  . Multiple Vitamins-Minerals (SENIOR MULTIVITAMIN PLUS) TABS Take 1 tablet by mouth daily.   . NONFORMULARY OR COMPOUNDED ITEM [facility]:  Peripheral Neuropathy Cream - Bupivacaine 1%, Doxepin 3%, Gabapentin 6%, Pentoxifylline 3%, Topiramate 1%, apply 1-2 grams to affected areas 3-4 times daily prn.  . Omega-3 Fatty Acids (FISH OIL) 1200 MG CPDR Take 1,200 mg by mouth daily. Take one tablet by mouth once daily   . pantoprazole (PROTONIX) 40 MG tablet Take 1 tablet (40 mg total) by mouth 2 (two) times daily before a meal.  . PREDNISOLONE ACETATE OP Place 1 drop into the right eye 3 (three) times daily.  . risperiDONE (RISPERDAL) 0.5 MG tablet TAKE 1 TABLET BY MOUTH TWICE A DAY  . Tdap (BOOSTRIX) 5-2.5-18.5 LF-MCG/0.5 injection Inject 0.5 mLs into the muscle once for 1 dose.  . travoprost, benzalkonium, (TRAVATAN) 0.004 % ophthalmic solution Place 1 drop into the left eye at bedtime.  . TROPICAMIDE OP Place 1 drop into the right eye daily.  . vitamin C (ASCORBIC ACID) 500 MG tablet Take 500 mg by mouth daily.  . [DISCONTINUED] Tdap (BOOSTRIX) 5-2.5-18.5 LF-MCG/0.5 injection Inject 0.5 mLs into the muscle once.   No facility-administered encounter medications on file as of 08/19/2019.     Review of Systems:  Review of Systems  Constitutional: Negative for chills, fever and malaise/fatigue.  HENT: Positive for hearing loss.   Eyes: Positive for blurred vision.  Respiratory: Negative for cough and shortness of breath.   Cardiovascular: Negative for chest pain, palpitations and leg swelling.       Uses compression hose  Gastrointestinal: Positive for abdominal pain, constipation and heartburn. Negative for blood in stool, diarrhea, melena, nausea and vomiting.       LUQ pain  Genitourinary:  Negative for dysuria.  Musculoskeletal: Positive for joint pain. Negative for falls.  Skin: Negative for itching and rash.  Neurological: Negative for dizziness and loss of consciousness.  Endo/Heme/Allergies: Bruises/bleeds easily.  Psychiatric/Behavioral: Positive for memory loss. Negative for depression. The patient is not nervous/anxious and does not have insomnia.     Health Maintenance  Topic Date Due  . TETANUS/TDAP  10/04/1943  . MAMMOGRAM  11/19/2019  . INFLUENZA VACCINE  Completed  . DEXA SCAN  Completed  . PNA vac Low Risk Adult  Completed    Physical Exam: Vitals:   08/19/19 1342  BP: 140/78  Pulse: 60  Temp: 97.7 F (36.5 C)  TempSrc: Oral  SpO2: 97%  Weight:  138 lb (62.6 kg)  Height: 5\' 2"  (1.575 m)   Body mass index is 25.24 kg/m. Physical Exam Vitals signs reviewed.  Constitutional:      Appearance: Normal appearance.  HENT:     Head: Normocephalic and atraumatic.  Cardiovascular:     Rate and Rhythm: Normal rate and regular rhythm.  Pulmonary:     Effort: Pulmonary effort is normal.     Breath sounds: Normal breath sounds.  Abdominal:     General: Bowel sounds are normal. There is no distension.     Palpations: Abdomen is soft. There is no mass.     Tenderness: There is no abdominal tenderness. There is no guarding or rebound.  Musculoskeletal: Normal range of motion.     Right lower leg: No edema.     Left lower leg: No edema.  Skin:    General: Skin is warm and dry.  Neurological:     General: No focal deficit present.     Mental Status: She is alert.     Comments: Ambulates with cane  Psychiatric:        Mood and Affect: Mood normal.     Labs reviewed: Basic Metabolic Panel: Recent Labs    09/14/18 1547 04/15/19 1455  NA 142 138  K 4.1 4.1  CL 107 106  CO2 27 25  GLUCOSE 92 120*  BUN 18 18  CREATININE 0.97* 0.88  CALCIUM 9.7 9.5  MG  --  2.3   Liver Function Tests: Recent Labs    09/14/18 1547 04/15/19 1455  AST 23  25  ALT 17 21  BILITOT 0.8 0.9  PROT 6.8 6.8   No results for input(s): LIPASE, AMYLASE in the last 8760 hours. No results for input(s): AMMONIA in the last 8760 hours. CBC: Recent Labs    09/14/18 1547 04/15/19 1455  WBC 6.0 6.2  NEUTROABS 3,384 3,522  HGB 12.2 11.7  HCT 36.4 35.0  MCV 94.3 95.9  PLT 213 209   Lipid Panel: Recent Labs    09/14/18 1547  CHOL 144  HDL 75  LDLCALC 55  TRIG 61  CHOLHDL 1.9   Lab Results  Component Value Date   HGBA1C 5.6 04/22/2016    Assessment/Plan 1. Left upper quadrant pain - seems this is a combination of her gerd, gone untreated b/c she thought protonix caused hip pain based on some paperwork from the pharmacy (does not make any sense to me) -she cannot take alternatives due to plavix - pantoprazole (PROTONIX) 40 MG tablet; Take 1 tablet (40 mg total) by mouth 2 (two) times daily before a meal.  Dispense: 60 tablet; Refill: 11 - Basic metabolic panel - Hepatic function panel  2. Gastroesophageal reflux disease without esophagitis -resume protonix before meals - Basic metabolic panel - Hepatic function panel  3. Chronic idiopathic constipation -continue to use miralax every few days to continue bowels moving and prevent hard balls from forming -hydrate well  4. Macular degeneration of left eye, unspecified type -did not respond to surgery and vision worsening to where she really cannot read for pleasure now -cont regular ophtho f/u  5. Need for Tdap vaccination - Tdap ([place]) 5-2.5-18.5 LF-MCG/0.5 injection; Inject 0.5 mLs into the muscle once for 1 dose.  Dispense: 0.5 mL; Refill: 0 -Rx sent to pharmacy  6. Cerebrovascular disease -reason for plavix -f/u labs as she fasted today: - Basic metabolic panel - Hepatic function panel - Lipid panel  7. Hyperlipidemia, unspecified hyperlipidemia type - Lipid  panel -cont low dose lipitor due to CVD  Labs/tests ordered:   Orders Placed This Encounter  Procedures  .  Basic metabolic panel    Order Specific Question:   Has the patient fasted?    Answer:   Yes  . Hepatic function panel  . Lipid panel    Order Specific Question:   Has the patient fasted?    Answer:   Yes    Next appt:  09/27/2019 keep regular visit  Dora Simeone L. Archie Shea, D.O. [facility] [address], [place] [address] Cell Phone (Mon-Fri 8am-5pm):  [phone] On Call:  [phone] & follow prompts after 5pm & weekends Office Phone:  [phone] Office Fax:  [phone]

## 2019-08-19 NOTE — Patient Instructions (Addendum)
Let's try the protonix again.  Perhaps the problem was a difference between brand and generic.  Brand is protonix and generic is pantoprazole.  Unfortunately, because you are on plavix, you cannot take any of the alternative medications in the same class.  Age-Related Macular Degeneration  Age-related macular degeneration (AMD) is an eye disease related to aging. The disease causes a loss of central vision. Central vision allows a person to see objects clearly and do daily tasks like reading and driving. There are two main types of AMD:  Dry AMD. People with this type generally lose their vision slowly. This is the most common type of AMD. Some people with dry AMD notice very little change in their vision as they age.  Wet AMD. People with this type can lose their vision quickly. What are the causes? This condition is caused by damage to the part of the eye that provides you with central vision (macula).  Dry AMD happens when deposits in the macula cause light-sensitive cells to slowly break down.  Wet AMD happens when abnormal blood vessels grow under the macula and leak blood and fluid. What increases the risk? You are more likely to develop this condition if you:  Are 20 years old or older, and especially 55 years old or older.  Smoke.  Are obese.  Have a family history of AMD.  Have high cholesterol, high blood pressure, or heart disease.  Have been exposed to high levels of ultraviolet (UV) light and blue light.  Are white (Caucasian).  Are female. What are the signs or symptoms? Common symptoms of this condition include:  Blurred vision, especially when reading print material. The blurred vision often improves in brighter light.  A blurred or blind spot in the center of your field of vision that is small but growing larger.  Bright colors seeming less bright than they used to be.  Decreased ability to recognize and see faces.  One eye seeing worse than the  other.  Decreased ability to adapt to dimly lit rooms.  Straight lines appearing crooked or wavy. How is this diagnosed? This condition is diagnosed based on your symptoms and an eye exam. During the eye exam:  Eye drops will be placed into your eyes to enlarge (dilate) your pupils. This will allow your health care provider to see the back of your eye.  You may be asked to look at an image that looks like a checkerboard (Amsler grid). Early changes in your central vision may cause the grid to appear distorted. After the exam, you may be given one or both of these tests:  Fluorescein angiogram. This test determines whether you have dry or wet AMD.  Optical coherence tomography (OCT) test to evaluate deep layers of the retina. How is this treated? There is no cure for this condition, but treatment can help to slow down progression of the disease. This condition may be treated with:  Supplements, including vitamin C, vitamin E, beta carotene, and zinc.  Laser surgery to destroy new blood vessels or leaking blood vessels in your eye.  Injections of medicines into your eye to slow down the formation of abnormal blood vessels that may leak. These injections may need to be repeated on a routine basis. Follow these instructions at home:  Take over-the-counter and prescription medicines only as told by your health care provider.  Take vitamins and supplements as told by your health care provider.  Ask your health care provider for an Amsler grid. Use  it every day to check each eye for vision changes.  Get an eye exam as often as told by your health care provider. Make sure to get an eye exam at least once every year.  Keep all follow-up visits as told by your health care provider. This is important. Contact a health care provider if:  You notice any new changes in your vision. Get help right away if:  You suddenly lose vision or develop pain in the eye. Summary  Age-related macular  degeneration (AMD) is an eye disease related to aging. There are two types of this condition: dry AMD and wet AMD.  This condition is caused by damage to the part of the eye that provides you with central vision (macula).  Once diagnosed with AMD, make sure to get an eye exam every year, take supplements and vitamins as directed, use an Amsler grid at home, and follow up with your health care provider. This information is not intended to replace advice given to you by your health care provider. Make sure you discuss any questions you have with your health care provider. Document Released: 12/31/2007 Document Revised: 04/01/2018 Document Reviewed: 04/01/2018 Elsevier Patient Education  2020 [employer].

## 2019-08-20 ENCOUNTER — Encounter: Payer: Medicare Other | Admitting: Family

## 2019-08-20 ENCOUNTER — Ambulatory Visit: Payer: Self-pay

## 2019-08-20 LAB — LIPID PANEL
Cholesterol: 155 mg/dL (ref <200)
HDL: 87 mg/dL (ref >=50)
LDL Cholesterol (Calc): 55 mg/dL (calc)
Non-HDL Cholesterol (Calc): 68 mg/dL (calc) (ref <130)
Total CHOL/HDL Ratio: 1.8 (calc) (ref <5.0)
Triglycerides: 47 mg/dL (ref <150)

## 2019-08-20 LAB — HEPATIC FUNCTION PANEL
AG Ratio: 1.4 (calc) (ref 1.0–2.5)
ALT: 22 U/L (ref 6–29)
AST: 27 U/L (ref 10–35)
Albumin: 4.3 g/dL (ref 3.6–5.1)
Alkaline phosphatase (APISO): 79 U/L (ref 37–153)
Bilirubin, Direct: 0.2 mg/dL (ref 0.0–0.2)
Globulin: 3.1 g/dL (calc) (ref 1.9–3.7)
Indirect Bilirubin: 0.9 mg/dL (calc) (ref 0.2–1.2)
Total Bilirubin: 1.1 mg/dL (ref 0.2–1.2)
Total Protein: 7.4 g/dL (ref 6.1–8.1)

## 2019-08-20 LAB — BASIC METABOLIC PANEL
BUN/Creatinine Ratio: 14 (calc) (ref 6–22)
BUN: 13 mg/dL (ref 7–25)
CO2: 21 mmol/L (ref 20–32)
Calcium: 9.9 mg/dL (ref 8.6–10.4)
Chloride: 107 mmol/L (ref 98–110)
Creat: 0.91 mg/dL — ABNORMAL HIGH (ref 0.60–0.88)
Glucose, Bld: 102 mg/dL — ABNORMAL HIGH (ref 65–99)
Potassium: 4.2 mmol/L (ref 3.5–5.3)
Sodium: 141 mmol/L (ref 135–146)

## 2019-08-20 NOTE — Progress Notes (Unsigned)
This service is provided via telemedicine  No vital signs collected/recorded due to the encounter was a telemedicine visit.   Location of patient (ex: home, work):  Home   Patient consents to a telephone visit:  Yes  Location of the provider (ex: office, home): Office  Name of any referring provider:  N/A  Names of all persons participating in the telemedicine service and their role in the encounter: Marisa Cyphers RMA, Marlowe Sax NP, Patient Cathrine Muster and Daughter  Time spent on call:  10 min.

## 2019-08-23 ENCOUNTER — Encounter: Payer: Self-pay | Admitting: Family

## 2019-08-23 ENCOUNTER — Other Ambulatory Visit: Payer: Self-pay

## 2019-08-23 ENCOUNTER — Ambulatory Visit: Payer: Self-pay | Admitting: Internal Medicine

## 2019-08-23 ENCOUNTER — Ambulatory Visit (INDEPENDENT_AMBULATORY_CARE_PROVIDER_SITE_OTHER): Payer: Medicare Other | Admitting: Family

## 2019-08-23 DIAGNOSIS — Z0000 Encounter for general adult medical examination without abnormal findings: Secondary | ICD-10-CM | POA: Diagnosis not present

## 2019-08-23 DIAGNOSIS — Z23 Encounter for immunization: Secondary | ICD-10-CM | POA: Diagnosis not present

## 2019-08-23 MED ORDER — TETANUS-DIPHTH-ACELL PERTUSSIS 5-2-15.5 LF-MCG/0.5 IM SUSP: 0.5000 mL | Freq: Once | INTRAMUSCULAR | 0 refills | Status: AC

## 2019-08-23 NOTE — Patient Instructions (Signed)
[name] [name] , Thank you for taking time to come for your Medicare Wellness Visit. I appreciate your ongoing commitment to your health goals. Please review the following plan we discussed and let me know if I can assist you in the future.   Screening recommendations/referrals: Colonoscopy: N/A  Mammogram: Up to date  Bone Density : Up to date  Recommended yearly ophthalmology/optometry visit for glaucoma screening and checkup Recommended yearly dental visit for hygiene and checkup  Vaccinations: Influenza vaccine : Up to date  Pneumococcal vaccine: Up to date  Tdap vaccine: Ordered this visit. Shingles vaccine : Up to date    Advanced directives: Yes   Conditions/risks identified: Advance age female > 83 Years old,Hypertension   Next appointment: 1 year    Preventive Care 83 Years and Older, Female, Female Preventive care refers to lifestyle choices and visits with your health care provider that can promote health and wellness. What does preventive care include?  A yearly physical exam. This is also called an annual well check.  Dental exams once or twice a year.  Routine eye exams. Ask your health care provider how often you should have your eyes checked.  Personal lifestyle choices, including:  Daily care of your teeth and gums.  Regular physical activity.  Eating a healthy diet.  Avoiding tobacco and drug use.  Limiting alcohol use.  Practicing safe sex.  Taking low-dose aspirin every day.  Taking vitamin and mineral supplements as recommended by your health care provider. What happens during an annual well check? The services and screenings done by your health care provider during your annual well check will depend on your age, overall health, lifestyle risk factors, and family history of disease. Counseling  Your health care provider may ask you questions about your:  Alcohol use.  Tobacco use.  Drug use.  Emotional well-being.  Home and relationship  well-being.  Sexual activity.  Eating habits.  History of falls.  Memory and ability to understand (cognition).  Work and work Statistician.  Reproductive health. Screening  You may have the following tests or measurements:  Height, weight, and BMI.  Blood pressure.  Lipid and cholesterol levels. These may be checked every 5 years, or more frequently if you are over 58 years old.  Skin check.  Lung cancer screening. You may have this screening every year starting at age 28 if you have a 30-pack-year history of smoking and currently smoke or have quit within the past 15 years.  Fecal occult blood test (FOBT) of the stool. You may have this test every year starting at age 54.  Flexible sigmoidoscopy or colonoscopy. You may have a sigmoidoscopy every 5 years or a colonoscopy every 10 years starting at age 69.  Hepatitis C blood test.  Hepatitis B blood test.  Sexually transmitted disease (STD) testing.  Diabetes screening. This is done by checking your blood sugar (glucose) after you have not eaten for a while (fasting). You may have this done every 1-3 years.  Bone density scan. This is done to screen for osteoporosis. You may have this done starting at age 57.  Mammogram. This may be done every 1-2 years. Talk to your health care provider about how often you should have regular mammograms. Talk with your health care provider about your test results, treatment options, and if necessary, the need for more tests. Vaccines  Your health care provider may recommend certain vaccines, such as:  Influenza vaccine. This is recommended every year.  Tetanus, diphtheria, and acellular  pertussis (Tdap, Td) vaccine. You may need a Td booster every 10 years.  Zoster vaccine. You may need this after age 10.  Pneumococcal 13-valent conjugate (PCV13) vaccine. One dose is recommended after age 40.  Pneumococcal polysaccharide (PPSV23) vaccine. One dose is recommended after age 60.  Talk to your health care provider about which screenings and vaccines you need and how often you need them. This information is not intended to replace advice given to you by your health care provider. Make sure you discuss any questions you have with your health care provider. Document Released: 10/20/2015 Document Revised: 06/12/2016 Document Reviewed: 07/25/2015 Elsevier Interactive Patient Education  2017 [employer] Prevention in the Home Falls can cause injuries. They can happen to people of all ages. There are many things you can do to make your home safe and to help prevent falls. What can I do on the outside of my home?  Regularly fix the edges of walkways and driveways and fix any cracks.  Remove anything that might make you trip as you walk through a door, such as a raised step or threshold.  Trim any bushes or trees on the path to your home.  Use bright outdoor lighting.  Clear any walking paths of anything that might make someone trip, such as rocks or tools.  Regularly check to see if handrails are loose or broken. Make sure that both sides of any steps have handrails.  Any raised decks and porches should have guardrails on the edges.  Have any leaves, snow, or ice cleared regularly.  Use sand or salt on walking paths during winter.  Clean up any spills in your garage right away. This includes oil or grease spills. What can I do in the bathroom?  Use night lights.  Install grab bars by the toilet and in the tub and shower. Do not use towel bars as grab bars.  Use non-skid mats or decals in the tub or shower.  If you need to sit down in the shower, use a plastic, non-slip stool.  Keep the floor dry. Clean up any water that spills on the floor as soon as it happens.  Remove soap buildup in the tub or shower regularly.  Attach bath mats securely with double-sided non-slip rug tape.  Do not have throw rugs and other things on the floor that can make you  trip. What can I do in the bedroom?  Use night lights.  Make sure that you have a light by your bed that is easy to reach.  Do not use any sheets or blankets that are too big for your bed. They should not hang down onto the floor.  Have a firm chair that has side arms. You can use this for support while you get dressed.  Do not have throw rugs and other things on the floor that can make you trip. What can I do in the kitchen?  Clean up any spills right away.  Avoid walking on wet floors.  Keep items that you use a lot in easy-to-reach places.  If you need to reach something above you, use a strong step stool that has a grab bar.  Keep electrical cords out of the way.  Do not use floor polish or wax that makes floors slippery. If you must use wax, use non-skid floor wax.  Do not have throw rugs and other things on the floor that can make you trip. What can I do with my stairs?  Do not leave any items on the stairs.  Make sure that there are handrails on both sides of the stairs and use them. Fix handrails that are broken or loose. Make sure that handrails are as long as the stairways.  Check any carpeting to make sure that it is firmly attached to the stairs. Fix any carpet that is loose or worn.  Avoid having throw rugs at the top or bottom of the stairs. If you do have throw rugs, attach them to the floor with carpet tape.  Make sure that you have a light switch at the top of the stairs and the bottom of the stairs. If you do not have them, ask someone to add them for you. What else can I do to help prevent falls?  Wear shoes that:  Do not have high heels.  Have rubber bottoms.  Are comfortable and fit you well.  Are closed at the toe. Do not wear sandals.  If you use a stepladder:  Make sure that it is fully opened. Do not climb a closed stepladder.  Make sure that both sides of the stepladder are locked into place.  Ask someone to hold it for you, if  possible.  Clearly mark and make sure that you can see:  Any grab bars or handrails.  First and last steps.  Where the edge of each step is.  Use tools that help you move around (mobility aids) if they are needed. These include:  Canes.  Walkers.  Scooters.  Crutches.  Turn on the lights when you go into a dark area. Replace any light bulbs as soon as they burn out.  Set up your furniture so you have a clear path. Avoid moving your furniture around.  If any of your floors are uneven, fix them.  If there are any pets around you, be aware of where they are.  Review your medicines with your doctor. Some medicines can make you feel dizzy. This can increase your chance of falling. Ask your doctor what other things that you can do to help prevent falls. This information is not intended to replace advice given to you by your health care provider. Make sure you discuss any questions you have with your health care provider. Document Released: 07/20/2009 Document Revised: 02/29/2016 Document Reviewed: 10/28/2014 Elsevier Interactive Patient Education  2017 [employer].

## 2019-08-23 NOTE — Progress Notes (Signed)
This service is provided via telemedicine  No vital signs collected/recorded due to the encounter was a telemedicine visit.   Location of patient (ex: home, work): Home   Patient consents to a telephone visit: Yes  Location of the provider (ex: office, home): Office   Name of any referring provider: Hollace Kinnier, [place] of all persons participating in the telemedicine service and their role in the encounter: Marlowe Sax, NP, Ruthell Rummage CMA, and patient   Time spent on call:  Verlin Swaine CMA spent 23 minutes on phone with patient.

## 2019-08-23 NOTE — Progress Notes (Signed)
Subjective:   [name] [name] is a 85 y.o. female who presents for Medicare Annual (Subsequent) preventive examination.  Review of Systems:  Cardiac Risk Factors include: advanced age (>50men, >63 women);hypertension     Objective:     Vitals: There were no vitals taken for this visit.  There is no height or weight on file to calculate BMI.  Advanced Directives 08/23/2019 08/19/2019 04/26/2019 04/14/2019 07/31/2018 11/27/2017 07/31/2017  Does Patient Have a Medical Advance Directive? Yes Unable to assess, patient is non-responsive or altered mental status Yes No Yes No Yes  Type of Advance Directive Living will;Healthcare Power of [place] of facility DNR (pink MOST or yellow form) - Press photographer;Living will - [place]  Does patient want to make changes to medical advance directive? No - Patient declined - No - Patient declined - No - Patient declined - -  Copy of [place] in Chart? Yes - validated most recent copy scanned in chart (See row information) - - - No - copy requested - No - copy requested  Would patient like information on creating a medical advance directive? - - No - Patient declined No - Patient declined - No - Patient declined -  Pre-existing out of facility DNR order (yellow form or pink MOST form) - - Yellow form placed in chart (order not valid for inpatient use) - - - -    Tobacco Social History   Tobacco Use  Smoking Status Never Smoker  Smokeless Tobacco Never Used     Counseling given: Not Answered   Clinical Intake:  Pre-visit preparation completed: No  Pain : No/denies pain     BMI - recorded: 25.24 Nutritional Status: BMI 25 -29 Overweight Nutritional Risks: None Diabetes: No  How often do you need to have someone help you when you read instructions, pamphlets, or other written materials from your doctor or pharmacy?: 5 - Always What is the last grade level you completed in school?: 12  Grade and Cosmetology  Interpreter Needed?: No  Information entered by :: Erisa Mehlman FNP-C  Past Medical History:  Diagnosis Date  . Anxiety   . Asthma   . Benign essential hypertension   . Bradycardia, drug induced 06/11/2016  . Edema extremities   . GERD (gastroesophageal reflux disease)   . Glaucoma   . Macular degeneration    Per patient   . Osteoarthritis   . PVD (peripheral vascular disease) ([place])   . Seasonal allergies   . Stroke [facility])    Past Surgical History:  Procedure Laterality Date  . ABDOMINAL HYSTERECTOMY  1981  . TONSILLECTOMY  1953   Family History  Problem Relation Age of Onset  . Stroke Father   . Hypertension Father   . Heart disease Mother   . Hypertension Mother   . Diabetes Mother    Social History   Socioeconomic History  . Marital status: Widowed    Spouse name: Not on file  . Number of children: Not on file  . Years of education: Not on file  . Highest education level: Not on file  Occupational History  . Not on file  Social Needs  . Financial resource strain: Not hard at all  . Food insecurity    Worry: Never true    Inability: Never true  . Transportation needs    Medical: No    Non-medical: No  Tobacco Use  . Smoking status: Never Smoker  . Smokeless  tobacco: Never Used  Substance and Sexual Activity  . Alcohol use: No    Alcohol/week: 0.0 standard drinks  . Drug use: No  . Sexual activity: Not Currently  Lifestyle  . Physical activity    Days per week: 3 days    Minutes per session: 30 min  . Stress: Not at all  Relationships  . Social connections    Talks on phone: More than three times a week    Gets together: More than three times a week    Attends religious service: More than 4 times per year    Active member of club or organization: No    Attends meetings of clubs or organizations: Never    Relationship status: Widowed  Other Topics Concern  . 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 acetaminophen (TYLENOL) 500 MG tablet Take 500 mg by mouth every 6 (six) hours as needed.  Marland Kitchen albuterol (PROVENTIL HFA;VENTOLIN HFA) 108 (90 Base) MCG/ACT inhaler Inhale 2 puffs into the lungs every 6 (six) hours as needed for wheezing.  Marland Kitchen alum hydroxide-mag trisilicate (GAVISCON) [id number] MG CHEW chewable tablet Chew 2 tablets by mouth 2 (two) times daily.   Marland Kitchen amLODipine (NORVASC) 5 MG tablet TAKE 1 TABLET BY MOUTH EVERY DAY  . atorvastatin (LIPITOR) 10 MG tablet TAKE 1 TABLET BY MOUTH EVERY DAY  . atropine 1 % ophthalmic solution PLACE 1 DROP INTO THE RIGHT EYE DAILY.  . B Complex Vitamins (B COMPLEX PO) Take 1 tablet by mouth daily.  . Brinzolamide-Brimonidine (SIMBRINZA) 1-0.2 % SUSP Apply 1 drop to eye 3 (three) times daily.  . budesonide-formoterol (SYMBICORT) 80-4.5 MCG/ACT inhaler Inhale 2 puffs into the lungs 2 (two) times daily.  . Calcium Carbonate-Vitamin D (CALTRATE 600+D) 600-400 MG-UNIT per tablet Take 2 tablets by mouth daily.  . Cholecalciferol (VITAMIN D3) 2000 units TABS Take 2,000 Units by mouth daily.  . clopidogrel (PLAVIX) 75 MG tablet TAKE 1 TABLET (75 MG TOTAL) BY MOUTH DAILY.  Marland Kitchen Coenzyme Q10 (COQ10) 50 MG CAPS Take 50 mg by mouth daily.  . fluticasone (FLONASE) 50 MCG/ACT nasal spray Place 1 spray into the nose daily.  Marland Kitchen loratadine (CLARITIN) 5 MG chewable tablet Chew 1 tablet (5 mg total) by mouth daily.  . Menthol, Topical Analgesic, (BIOFREEZE) 4 % GEL Apply 1 application topically 3 (three) times daily. Apply to knee  . Multiple Vitamins-Minerals (PRESERVISION AREDS 2) CAPS Take 1 capsule by mouth 2 (two) times daily.  . Multiple Vitamins-Minerals (SENIOR MULTIVITAMIN PLUS) TABS Take 1 tablet by mouth daily.   . NONFORMULARY OR COMPOUNDED ITEM [facility]:  Peripheral Neuropathy Cream - Bupivacaine 1%, Doxepin 3%,  Gabapentin 6%, Pentoxifylline 3%, Topiramate 1%, apply 1-2 grams to affected areas 3-4 times daily prn.  . Omega-3 Fatty Acids (FISH OIL) 1200 MG CPDR Take 1,200 mg by mouth daily. Take one tablet by mouth once daily   . pantoprazole (PROTONIX) 40 MG tablet Take 1 tablet (40 mg total) by mouth 2 (two) times daily before a meal.  . PREDNISOLONE ACETATE OP Place 1 drop into the right eye 3 (three) times daily.  . risperiDONE (RISPERDAL) 0.5 MG tablet TAKE 1 TABLET BY MOUTH TWICE A DAY  . travoprost, benzalkonium, (TRAVATAN) 0.004 % ophthalmic solution Place 1 drop into the left eye at bedtime.  Marland Kitchen  TROPICAMIDE OP Place 1 drop into the right eye daily.  . vitamin C (ASCORBIC ACID) 500 MG tablet Take 500 mg by mouth daily.   No facility-administered encounter medications on file as of 08/23/2019.     Activities of Daily Living In your present state of health, do you have any difficulty performing the following activities: 08/23/2019  Hearing? N  Vision? Y  Comment status post cataract leser syurgery  Difficulty concentrating or making decisions? N  Walking or climbing stairs? Y  Comment uses a cane  Dressing or bathing? N  Doing errands, shopping? Y  Comment Has assistance with transportation  Preparing Food and eating ? Y  Comment son assist  Using the Toilet? Y  Comment constipated sometimes.  In the past six months, have you accidently leaked urine? N  Do you have problems with loss of bowel control? N  Managing your Medications? N  Managing your Finances? Y  Comment Daughter assist  Housekeeping or managing your Housekeeping? Y  Comment Son assist  Some recent data might be hidden    Patient Care Team: Gayland Curry, DO as PCP - General (Geriatric Medicine) Sueanne Margarita, MD as PCP - Cardiology (Cardiology) Trula Slade, DPM as Consulting Physician (Podiatry) Bond, Tracie Harrier, MD as Referring Physician (Ophthalmology)    Assessment:   This is a routine wellness  examination for [name] [name].  Exercise Activities and Dietary recommendations Current Exercise Habits: Home exercise routine, Type of exercise: stretching, Time (Minutes): 30, Frequency (Times/Week): 6, Weekly Exercise (Minutes/Week): 180, Intensity: Moderate, Exercise limited by: Other - see comments(vision)  Goals    . Walking     Pt would like to walk with someone 10 days out of the month       Fall Risk Fall Risk  08/23/2019 08/19/2019 05/27/2019 04/26/2019 04/14/2019  Falls in the past year? 0 0 0 0 0  Number falls in past yr: 0 - 0 0 -  Injury with Fall? 0 - 0 0 -   Is the patient's home free of loose throw rugs in walkways, pet beds, electrical cords, etc?   no      Grab bars in the bathroom? yes      Handrails on the stairs?   yes      Adequate lighting?   yes  Depression Screen PHQ 2/9 Scores 08/23/2019 05/27/2019 04/14/2019 01/21/2019  PHQ - 2 Score 0 0 0 0     Cognitive Function MMSE - Mini Mental State Exam 07/31/2018 03/24/2017 01/10/2015 06/11/2013  Orientation to time 3 4 4 5   Orientation to Place 5 5 5 5   Registration 3 3 3 3   Attention/ Calculation 5 4 4 5   Recall 2 1 2 2   Language- name 2 objects 2 2 2 2   Language- repeat 1 1 1 1   Language- follow 3 step command 3 2 3 3   Language- read & follow direction 1 1 1 1   Write a sentence 1 1 1 1   Copy design 1 1 1 1   Total score 27 25 27 29      6CIT Screen 08/23/2019  What Year? 0 points  What month? 0 points  What time? 0 points  Count back from 20 0 points  Months in reverse 0 points  Repeat phrase 0 points  Total Score 0    Immunization History  Administered Date(s) Administered  . Fluad Quad(high Dose 65+) 06/04/2019  . Influenza, High Dose Seasonal PF 07/16/2017, 07/20/2018  . Influenza,inj,Quad PF,6+ Mos  06/11/2013, 06/30/2014, 07/17/2015, 06/27/2016  . Influenza-Unspecified 07/07/2012  . Pneumococcal Conjugate-13 11/07/2014  . Pneumococcal Polysaccharide-23 10/08/2007  . Zoster Recombinat (Shingrix)  07/31/2017, 12/09/2017    Qualifies for Shingles Vaccine? Up to date  Screening Tests Health Maintenance  Topic Date Due  . TETANUS/TDAP  10/04/1945  . MAMMOGRAM  11/19/2019  . INFLUENZA VACCINE  Completed  . DEXA SCAN  Completed  . PNA vac Low Risk Adult  Completed    Cancer Screenings: Lung: Low Dose CT Chest recommended if Age 42-80 years, 30 pack-year currently smoking OR have quit w/in 15years. Patient does not qualify. Breast:  Up to date on Mammogram? Yes   Up to date of Bone Density/Dexa? Yes Colorectal: Age Out   Additional Screenings: Hepatitis C Screening: Low Risk      Plan:   - Tdap Vaccine    I have personally reviewed and noted the following in the patient's chart:   . Medical and social history . Use of alcohol, tobacco or illicit drugs  . Current medications and supplements . Functional ability and status . Nutritional status . Physical activity . Advanced directives . List of other physicians . Hospitalizations, surgeries, and ER visits in previous 12 months . Vitals . Screenings to include cognitive, depression, and falls . Referrals and appointments  In addition, I have reviewed and discussed with patient certain preventive protocols, quality metrics, and best practice recommendations. A written personalized care plan for preventive services as well as general preventive health recommendations were provided to patient.    Sandrea Hughs, NP  08/23/2019

## 2019-08-30 ENCOUNTER — Other Ambulatory Visit: Payer: Self-pay | Admitting: Internal Medicine

## 2019-09-27 ENCOUNTER — Encounter: Payer: Self-pay | Admitting: Internal Medicine

## 2019-09-27 ENCOUNTER — Other Ambulatory Visit: Payer: Self-pay

## 2019-09-27 ENCOUNTER — Ambulatory Visit (INDEPENDENT_AMBULATORY_CARE_PROVIDER_SITE_OTHER): Payer: Medicare Other | Admitting: Internal Medicine

## 2019-09-27 DIAGNOSIS — H3530 Unspecified macular degeneration: Secondary | ICD-10-CM | POA: Diagnosis not present

## 2019-09-27 DIAGNOSIS — M25562 Pain in left knee: Secondary | ICD-10-CM

## 2019-09-27 DIAGNOSIS — M1612 Unilateral primary osteoarthritis, left hip: Secondary | ICD-10-CM | POA: Diagnosis not present

## 2019-09-27 DIAGNOSIS — K5904 Chronic idiopathic constipation: Secondary | ICD-10-CM

## 2019-09-27 DIAGNOSIS — G8929 Other chronic pain: Secondary | ICD-10-CM | POA: Diagnosis not present

## 2019-09-27 DIAGNOSIS — K219 Gastro-esophageal reflux disease without esophagitis: Secondary | ICD-10-CM | POA: Diagnosis not present

## 2019-09-27 NOTE — Progress Notes (Signed)
[name], female   DOB: 06/25/1924, 85 y.o.   MRN: [id number] This service is provided via telemedicine  No vital signs collected/recorded due to the encounter was a telemedicine visit.   Location of patient (ex: home, work):  HOME  Patient consents to a telephone visit:  YES  Location of the provider (ex: office, home):  OFFICE  Name of any referring provider:  DR [facility] Margery Szostak, DO  Names of all persons participating in the telemedicine service and their role in the encounter: DAUGHTER, PATIENT, [name], [place], [place], DO   Time spent on call:  4:15    Provider:  Cesare Sumlin [name], D.O., C.M.D.  Code Status: DNR Goals of Care:  Advanced Directives 08/23/2019  Does Patient Have a Medical Advance Directive? Yes  Type of Advance Directive Living will;Healthcare Power of Attorney  Does patient want to make changes to medical advance directive? No - Patient declined  Copy of [place] in Chart? Yes - validated most recent copy scanned in chart (See row information)  Would patient like information on creating a medical advance directive? -  Pre-existing out of facility DNR order (yellow form or pink MOST form) -     Chief Complaint  Patient presents with  . Medical Management of Chronic Issues    4MTH FOLLOW-UP    HPI: Patient is a 85 y.o. female seen today for medical management of chronic diseases.    Notes discomfort in her left side gets worse when she eats certain things--bread lays in there real heavy.  She has had gas since eating prunes.  She eats cereal for breakfast.  Prunes keep the cereal flushing through that area.  Miralax also keeps it moving.  She does that a few times a week.  That keeps it moving.  That also makes the left side discomfort better.  Reminded her about drinking more water, too.  Says she does.    Knees had gotten to where she could hardly walk.  She's going to try again with walking more.  She's had to  use rub for her joints.    She has not gotten her tdap vaccine yet.  She does not like asking people to run her around.  She says she didn't realize it was to help with immunity for whooping cough.    She is still insisting that protonix gave her hip problems.  She's using her knee rub on her hip, also, and that's helped.  She's actually taking it though.    Past Medical History:  Diagnosis Date  . Anxiety   . Asthma   . Benign essential hypertension   . Bradycardia, drug induced 06/11/2016  . Edema extremities   . GERD (gastroesophageal reflux disease)   . Glaucoma   . Macular degeneration    Per patient   . Osteoarthritis   . PVD (peripheral vascular disease) ([place])   . Seasonal allergies   . Stroke [facility])     Past Surgical History:  Procedure Laterality Date  . ABDOMINAL HYSTERECTOMY  1981  . TONSILLECTOMY  1953    Allergies  Allergen Reactions  . Aspirin Other (See Comments)    sweats  . Codeine Other (See Comments)  . Iodine Other (See Comments)    Pt is not aware of this allergy, does not recall much info.  . Prednisone Other (See Comments)    sweats  . Lyrica [Pregabalin]     Wobbly, "more than sleepy"  .  Benzonatate Itching and Rash  . Sulfa Antibiotics Other (See Comments)    Doesn't remember    Outpatient Encounter Medications as of 09/27/2019  Medication Sig  . acetaminophen (TYLENOL) 500 MG tablet Take 500 mg by mouth every 6 (six) hours as needed.  Marland Kitchen albuterol (PROVENTIL HFA;VENTOLIN HFA) 108 (90 Base) MCG/ACT inhaler Inhale 2 puffs into the lungs every 6 (six) hours as needed for wheezing.  Marland Kitchen alum hydroxide-mag trisilicate (GAVISCON) [id number] MG CHEW chewable tablet Chew 2 tablets by mouth 2 (two) times daily.   Marland Kitchen amLODipine (NORVASC) 5 MG tablet TAKE 1 TABLET BY MOUTH EVERY DAY  . atorvastatin (LIPITOR) 10 MG tablet TAKE 1 TABLET BY MOUTH EVERY DAY  . atropine 1 % ophthalmic solution PLACE 1 DROP INTO THE RIGHT EYE DAILY.  . B Complex Vitamins (B COMPLEX  PO) Take 1 tablet by mouth daily.  . Brinzolamide-Brimonidine (SIMBRINZA) 1-0.2 % SUSP Apply 1 drop to eye 3 (three) times daily.  . budesonide-formoterol (SYMBICORT) 80-4.5 MCG/ACT inhaler Inhale 2 puffs into the lungs 2 (two) times daily.  . Calcium Carbonate-Vitamin D (CALTRATE 600+D) 600-400 MG-UNIT per tablet Take 2 tablets by mouth daily.  . Cholecalciferol (VITAMIN D3) 2000 units TABS Take 2,000 Units by mouth daily.  . clopidogrel (PLAVIX) 75 MG tablet TAKE 1 TABLET (75 MG TOTAL) BY MOUTH DAILY.  Marland Kitchen Coenzyme Q10 (COQ10) 50 MG CAPS Take 50 mg by mouth daily.  . fluticasone (FLONASE) 50 MCG/ACT nasal spray Place 1 spray into the nose daily.  Marland Kitchen loratadine (CLARITIN) 5 MG chewable tablet Chew 1 tablet (5 mg total) by mouth daily.  . Menthol, Topical Analgesic, (BIOFREEZE) 4 % GEL Apply 1 application topically 3 (three) times daily. Apply to knee  . Multiple Vitamins-Minerals (PRESERVISION AREDS 2) CAPS Take 1 capsule by mouth 2 (two) times daily.  . Multiple Vitamins-Minerals (SENIOR MULTIVITAMIN PLUS) TABS Take 1 tablet by mouth daily.   . NONFORMULARY OR COMPOUNDED ITEM [facility]:  Peripheral Neuropathy Cream - Bupivacaine 1%, Doxepin 3%, Gabapentin 6%, Pentoxifylline 3%, Topiramate 1%, apply 1-2 grams to affected areas 3-4 times daily prn.  . Omega-3 Fatty Acids (FISH OIL) 1200 MG CPDR Take 1,200 mg by mouth daily. Take one tablet by mouth once daily   . pantoprazole (PROTONIX) 40 MG tablet Take 1 tablet (40 mg total) by mouth 2 (two) times daily before a meal.  . PREDNISOLONE ACETATE OP Place 1 drop into the right eye 3 (three) times daily.  . risperiDONE (RISPERDAL) 0.5 MG tablet TAKE 1 TABLET BY MOUTH TWICE A DAY  . travoprost, benzalkonium, (TRAVATAN) 0.004 % ophthalmic solution Place 1 drop into the left eye at bedtime.  . TROPICAMIDE OP Place 1 drop into the right eye daily.  . vitamin C (ASCORBIC ACID) 500 MG tablet Take 500 mg by mouth daily.   No facility-administered  encounter medications on file as of 09/27/2019.    Review of Systems:  Review of Systems  Constitutional: Negative for chills and fever.  HENT: Positive for hearing loss. Negative for congestion and sore throat.   Eyes: Positive for blurred vision.  Respiratory: Negative for cough and shortness of breath.   Cardiovascular: Negative for chest pain, palpitations and leg swelling.  Gastrointestinal: Positive for abdominal pain and constipation. Negative for blood in stool, diarrhea, heartburn and melena.  Genitourinary: Negative for dysuria.  Musculoskeletal: Positive for joint pain. Negative for falls.  Skin: Negative for itching and rash.  Neurological: Negative for dizziness and loss of consciousness.  Psychiatric/Behavioral:  Positive for memory loss. Negative for depression. The patient is not nervous/anxious and does not have insomnia.     Health Maintenance  Topic Date Due  . TETANUS/TDAP  10/04/1945  . MAMMOGRAM  11/19/2019  . INFLUENZA VACCINE  Completed  . DEXA SCAN  Completed  . PNA vac Low Risk Adult  Completed    Physical Exam: Could not be performed as visit non face-to-face via phone   Labs reviewed: Basic Metabolic Panel: Recent Labs    04/15/19 1455 08/19/19 1427  NA 138 141  K 4.1 4.2  CL 106 107  CO2 25 21  GLUCOSE 120* 102*  BUN 18 13  CREATININE 0.88 0.91*  CALCIUM 9.5 9.9  MG 2.3  --    Liver Function Tests: Recent Labs    04/15/19 1455 08/19/19 1427  AST 25 27  ALT 21 22  BILITOT 0.9 1.1  PROT 6.8 7.4   No results for input(s): LIPASE, AMYLASE in the last 8760 hours. No results for input(s): AMMONIA in the last 8760 hours. CBC: Recent Labs    04/15/19 1455  WBC 6.2  NEUTROABS 3,522  HGB 11.7  HCT 35.0  MCV 95.9  PLT 209   Lipid Panel: Recent Labs    08/19/19 1427  CHOL 155  HDL 87  LDLCALC 55  TRIG 47  CHOLHDL 1.8   Lab Results  Component Value Date   HGBA1C 5.6 04/22/2016    Assessment/Plan 1. Gastroesophageal  reflux disease without esophagitis -stable, is back on protonix, but still thinks it causes her hip pain (has arthritis) -cont to avoid food triggers and eating before lying down  2. Chronic idiopathic constipation -cont prn miralax, adequate water, increase mobility and continue fiber, prunes  3. Chronic pain of left knee -cont topical rub (uses biofreeze)  4. Macular degeneration of left eye, unspecified type -failed surgery unfortunately and still not seeing well, followed closely by ophtho   5. Primary osteoarthritis of left hip -continue biofreeze on it, as well -encouraged some walking in her home  Next appt:  01/28/2020  Non face-to-face time spent on televisit:  28 mins  Ragina Fenter L. Juanetta Negash, D.O. [facility] [address], [place] [address] Cell Phone (Mon-Fri 8am-5pm):  [phone] On Call:  [phone] & follow prompts after 5pm & weekends Office Phone:  [phone] Office Fax:  [phone]

## 2019-10-11 ENCOUNTER — Other Ambulatory Visit: Payer: Self-pay

## 2019-10-11 ENCOUNTER — Ambulatory Visit (INDEPENDENT_AMBULATORY_CARE_PROVIDER_SITE_OTHER): Payer: Medicare Other | Admitting: Podiatry

## 2019-10-11 DIAGNOSIS — B351 Tinea unguium: Secondary | ICD-10-CM | POA: Diagnosis not present

## 2019-10-11 DIAGNOSIS — M79675 Pain in left toe(s): Secondary | ICD-10-CM | POA: Diagnosis not present

## 2019-10-11 DIAGNOSIS — L84 Corns and callosities: Secondary | ICD-10-CM | POA: Diagnosis not present

## 2019-10-11 DIAGNOSIS — G5793 Unspecified mononeuropathy of bilateral lower limbs: Secondary | ICD-10-CM

## 2019-10-11 DIAGNOSIS — M79674 Pain in right toe(s): Secondary | ICD-10-CM

## 2019-10-11 NOTE — Patient Instructions (Signed)
Diabetes Mellitus and Foot Care Foot care is an important part of your health, especially when you have diabetes. Diabetes may cause you to have problems because of poor blood flow (circulation) to your feet and legs, which can cause your skin to:  Become thinner and drier.  Break more easily.  Heal more slowly.  Peel and crack. You may also have nerve damage (neuropathy) in your legs and feet, causing decreased feeling in them. This means that you may not notice minor injuries to your feet that could lead to more serious problems. Noticing and addressing any potential problems early is the best way to prevent future foot problems. How to care for your feet Foot hygiene  Wash your feet daily with warm water and mild soap. Do not use hot water. Then, pat your feet and the areas between your toes until they are completely dry. Do not soak your feet as this can dry your skin.  Trim your toenails straight across. Do not dig under them or around the cuticle. File the edges of your nails with an emery board or nail file.  Apply a moisturizing lotion or petroleum jelly to the skin on your feet and to dry, brittle toenails. Use lotion that does not contain alcohol and is unscented. Do not apply lotion between your toes. Shoes and socks  Wear clean socks or stockings every day. Make sure they are not too tight. Do not wear knee-high stockings since they may decrease blood flow to your legs.  Wear shoes that fit properly and have enough cushioning. Always look in your shoes before you put them on to be sure there are no objects inside.  To break in new shoes, wear them for just a few hours a day. This prevents injuries on your feet. Wounds, scrapes, corns, and calluses  Check your feet daily for blisters, cuts, bruises, sores, and redness. If you cannot see the bottom of your feet, use a mirror or ask someone for help.  Do not cut corns or calluses or try to remove them with medicine.  If you  find a minor scrape, cut, or break in the skin on your feet, keep it and the skin around it clean and dry. You may clean these areas with mild soap and water. Do not clean the area with peroxide, alcohol, or iodine.  If you have a wound, scrape, corn, or callus on your foot, look at it several times a day to make sure it is healing and not infected. Check for: ? Redness, swelling, or pain. ? Fluid or blood. ? Warmth. ? Pus or a bad smell. General instructions  Do not cross your legs. This may decrease blood flow to your feet.  Do not use heating pads or hot water bottles on your feet. They may burn your skin. If you have lost feeling in your feet or legs, you may not know this is happening until it is too late.  Protect your feet from hot and cold by wearing shoes, such as at the beach or on hot pavement.  Schedule a complete foot exam at least once a year (annually) or more often if you have foot problems. If you have foot problems, report any cuts, sores, or bruises to your health care provider immediately. Contact a health care provider if:  You have a medical condition that increases your risk of infection and you have any cuts, sores, or bruises on your feet.  You have an injury that is not   healing.  You have redness on your legs or feet.  You feel burning or tingling in your legs or feet.  You have pain or cramps in your legs and feet.  Your legs or feet are numb.  Your feet always feel cold.  You have pain around a toenail. Get help right away if:  You have a wound, scrape, corn, or callus on your foot and: ? You have pain, swelling, or redness that gets worse. ? You have fluid or blood coming from the wound, scrape, corn, or callus. ? Your wound, scrape, corn, or callus feels warm to the touch. ? You have pus or a bad smell coming from the wound, scrape, corn, or callus. ? You have a fever. ? You have a red line going up your leg. Summary  Check your feet every day  for cuts, sores, red spots, swelling, and blisters.  Moisturize feet and legs daily.  Wear shoes that fit properly and have enough cushioning.  If you have foot problems, report any cuts, sores, or bruises to your health care provider immediately.  Schedule a complete foot exam at least once a year (annually) or more often if you have foot problems. This information is not intended to replace advice given to you by your health care provider. Make sure you discuss any questions you have with your health care provider. Document Revised: 06/16/2019 Document Reviewed: 10/25/2016 Elsevier Patient Education  2020 [employer] Inc[employer]  

## 2019-10-14 ENCOUNTER — Encounter: Payer: Self-pay | Admitting: Podiatry

## 2019-10-14 NOTE — Progress Notes (Signed)
Subjective: [name] [name] presents today for preventative foot care on today. She has h/o blood thinner use and painful neuropathy b/l feet. Patient is seen for follow up of painful, mycotic toenails which interfere with comfortable ambulation when wearing enclosed shoe gear. Pain is relieved with periodic professional debridement.  Reed, Tiffany L, DO is patient's PCP. Last visit was: 09/27/2019.  Medications reviewed in chart.  Allergies  Allergen Reactions  . Aspirin Other (See Comments)    sweats  . Codeine Other (See Comments)  . Iodine Other (See Comments)    Pt is not aware of this allergy, does not recall much info.  . Prednisone Other (See Comments)    sweats  . Lyrica [Pregabalin]     Wobbly, "more than sleepy"  . Benzonatate Itching and Rash  . Sulfa Antibiotics Other (See Comments)    Doesn't remember   Objective: Temperature 97.7 degrees Fahrenheit  Vascular Examination: Capillary refill time to digits <3 seconds b/l.   Dorsalis pedis present b/l.  Posterior tibial pulses present b/l.  Digital hair absent b/l.  Skin temperature gradient WNL b/l.   Dermatological Examination: Skin with normal turgor, texture and tone b/l.  Toenails 1-5 b/l discolored, thick, dystrophic with subungual debris and pain with palpation to nailbeds due to thickness of nails.  Hyperkeratotic lesion left 4th digit PIPJ. No erythema, no edema, no drainage, no flocculence noted.  Musculoskeletal: Muscle strength 5/5 b/l to all LE muscle groups.  Hammertoe left 4th digit.  No pain, crepitus or joint limitation with passive/active ROM b/l.  Neurological Examination: Protective sensation intact 5/5 b/l with 10 gram monofilament.  Vibratory sensation intact bilaterally.   Assessment: 1. Painful onychomycosis toenails 1-5 b/l 2. Corn left 4th digit 3. Neuropathy 4. Pt on long term blood thinner  Plan: 1. Continue diabetic foot care principles. Literature dispensed on  today. 2. Toenails 1-5 b/l were debrided in length and girth without iatrogenic bleeding.  3. Corn(s) left 4th digit pared utilizing sterile scalpel blade without incident. 4. Patient to continue soft, supportive shoe gear. 5. Patient to report any pedal injuries to medical professional. 6. Follow up 3 months.  7. Patient/POA to call should there be a concern in the interim.

## 2019-10-24 ENCOUNTER — Other Ambulatory Visit: Payer: Self-pay | Admitting: Internal Medicine

## 2019-11-18 DIAGNOSIS — J3089 Other allergic rhinitis: Secondary | ICD-10-CM | POA: Diagnosis not present

## 2019-11-18 DIAGNOSIS — J300 Vasomotor rhinitis: Secondary | ICD-10-CM | POA: Diagnosis not present

## 2019-11-18 DIAGNOSIS — J4540 Moderate persistent asthma, uncomplicated: Secondary | ICD-10-CM | POA: Diagnosis not present

## 2019-11-20 ENCOUNTER — Ambulatory Visit: Payer: Medicare Other | Attending: Internal Medicine

## 2019-11-20 DIAGNOSIS — Z23 Encounter for immunization: Secondary | ICD-10-CM

## 2019-11-20 NOTE — Progress Notes (Signed)
   Covid-19 Vaccination Clinic  Name:  [name] [name]    MRN: [id number] DOB: 01-19-1924  11/20/2019  [name] was observed post Covid-19 immunization for 15 minutes without incidence. She was provided with Vaccine Information Sheet and instruction to access the V-Safe system.   [name] was instructed to call 911 with any severe reactions post vaccine: Marland Kitchen Difficulty breathing  . Swelling of your face and throat  . A fast heartbeat  . A bad rash all over your body  . Dizziness and weakness    Immunizations Administered    Name Date Dose VIS Date Route   Pfizer COVID-19 Vaccine 11/20/2019  2:46 PM 0.3 mL 09/17/2019 Intramuscular   Manufacturer: [place]   Lot: [id number]   [place]: [id number]

## 2019-12-07 ENCOUNTER — Telehealth: Payer: Self-pay | Admitting: *Deleted

## 2019-12-07 NOTE — Telephone Encounter (Signed)
Pt's dtr, Katharine Look states they are trying to get a refill of the compound.

## 2019-12-13 ENCOUNTER — Ambulatory Visit: Payer: Medicare Other | Attending: Internal Medicine

## 2019-12-13 DIAGNOSIS — Z23 Encounter for immunization: Secondary | ICD-10-CM

## 2019-12-13 NOTE — Progress Notes (Signed)
   Covid-19 Vaccination Clinic  Name:  [name] [name]    MRN: [id number] DOB: 06/27/1924  12/13/2019  [name] [name] was observed post Covid-19 immunization for 15 minutes without incident. She was provided with Vaccine Information Sheet and instruction to access the V-Safe system.   [name] [name] was instructed to call 911 with any severe reactions post vaccine: Marland Kitchen Difficulty breathing  . Swelling of face and throat  . A fast heartbeat  . A bad rash all over body  . Dizziness and weakness   Immunizations Administered    Name Date Dose VIS Date Route   Pfizer COVID-19 Vaccine 12/13/2019  2:47 PM 0.3 mL 09/17/2019 Intramuscular   Manufacturer: [place]   Lot: [id number]   [place]: [id number]

## 2019-12-29 DIAGNOSIS — H401124 Primary open-angle glaucoma, left eye, indeterminate stage: Secondary | ICD-10-CM | POA: Diagnosis not present

## 2019-12-29 DIAGNOSIS — H401113 Primary open-angle glaucoma, right eye, severe stage: Secondary | ICD-10-CM | POA: Diagnosis not present

## 2020-01-10 ENCOUNTER — Other Ambulatory Visit: Payer: Self-pay

## 2020-01-10 ENCOUNTER — Encounter: Payer: Self-pay | Admitting: Podiatry

## 2020-01-10 ENCOUNTER — Ambulatory Visit (INDEPENDENT_AMBULATORY_CARE_PROVIDER_SITE_OTHER): Payer: Medicare Other | Admitting: Podiatry

## 2020-01-10 VITALS — Temp 97.3°F

## 2020-01-10 DIAGNOSIS — M79674 Pain in right toe(s): Secondary | ICD-10-CM

## 2020-01-10 DIAGNOSIS — L84 Corns and callosities: Secondary | ICD-10-CM

## 2020-01-10 DIAGNOSIS — M79675 Pain in left toe(s): Secondary | ICD-10-CM

## 2020-01-10 DIAGNOSIS — B351 Tinea unguium: Secondary | ICD-10-CM | POA: Diagnosis not present

## 2020-01-10 DIAGNOSIS — Z9229 Personal history of other drug therapy: Secondary | ICD-10-CM

## 2020-01-10 DIAGNOSIS — G5793 Unspecified mononeuropathy of bilateral lower limbs: Secondary | ICD-10-CM

## 2020-01-10 NOTE — Patient Instructions (Signed)
Peripheral Vascular Disease Peripheral vascular disease (PVD) is a disease of the blood vessels. A simple term for PVD is poor circulation. In most cases, PVD narrows the blood vessels that carry blood from your heart to the rest of your body. This can result in a decreased supply of blood to your arms, legs, and internal organs, like your stomach or kidneys. However, it most often affects a person's lower legs and feet. There are two types of PVD.  Organic PVD. This is the more common type. It is caused by damage to the structure of blood vessels.  Functional PVD. This is caused by conditions that make blood vessels contract and tighten (spasm). Without treatment, PVD tends to get worse over time. PVD can also lead to acute limb ischemia. This is when an arm or leg suddenly has trouble getting enough blood. This is a medical emergency. What are the causes?  Each type of PVD has many different causes. The most common cause of PVD is buildup of a fatty material (plaque) inside your arteries (atherosclerosis). Small amounts of plaque can break off from the walls of the blood vessels and become lodged in a smaller artery. This blocks blood flow and can cause acute limb ischemia. Other common causes of PVD include:  Blood clots that form inside of blood vessels.  Injuries to blood vessels.  Diseases that cause inflammation of blood vessels or cause blood vessel spasms.  Health behaviors and health history that increase your risk of developing PVD. What increases the risk? You are more likely to develop this condition if:  You have a family history of PVD.  You have certain medical conditions, including: ? High cholesterol. ? Diabetes. ? High blood pressure (hypertension). ? Coronary heart disease. ? Past problems with blood clots. ? Past injury, such as burns or a broken bone. These may have damaged blood vessels in your limbs. ? Buerger disease. This is caused by inflamed blood vessels  in your hands and feet. ? Some forms of arthritis. ? Rare birth defects that affect the arteries in your legs. ? Kidney disease.  You use tobacco or smoke.  You do not get enough exercise.  You are obese.  You are age 50 or older. What are the signs or symptoms? This condition may cause different symptoms. Your symptoms depend on what part of your body is not getting enough blood. Some common signs and symptoms include:  Cramps in your lower legs. This may be a symptom of poor leg circulation (claudication).  Pain and weakness in your legs. This happens while you are physically active but goes away when you rest (intermittent claudication).  Leg pain when at rest.  Leg numbness, tingling, or weakness.  Coldness in a leg or foot, especially when compared with the other leg.  Skin or hair changes. These can include: ? Hair loss. ? Shiny skin. ? Pale or bluish skin. ? Thick toenails.  Inability to get or maintain an erection (erectile dysfunction).  Fatigue. People with PVD are more likely to develop ulcers and sores on their toes, feet, or legs. These may take longer than normal to heal. How is this diagnosed? This condition is diagnosed based on:  Your signs and symptoms.  A physical exam and your medical history.  Other tests to find out what is causing your PVD and to determine its severity. Tests may include: ? Blood pressure recordings from your arms and legs and measurements of the strength of your pulses (pulse volume   recordings). ? Imaging studies using sound waves to take pictures of the blood flow through your blood vessels (Doppler ultrasound). ? Injecting a dye into your blood vessels before having imaging studies using:  X-rays (angiogram or arteriogram).  Computer-generated X-rays (CT angiogram).  A powerful electromagnetic field and a computer (magnetic resonance angiogram or MRA). How is this treated? Treatment for PVD depends on the cause of your  condition and how severe your symptoms are. It also depends on your age. Underlying causes need to be treated and controlled. These include long-term (chronic) conditions, such as diabetes, high cholesterol, and high blood pressure. Treatment includes:  Lifestyle changes, such as: ? Quitting smoking. ? Exercising regularly. ? Following a low-fat, low-cholesterol diet.  Taking medicines, such as: ? Blood thinners to prevent blood clots. ? Medicines to improve blood flow. ? Medicines to improve your blood cholesterol levels.  Surgical procedures, such as: ? A procedure that uses an inflated balloon to open a blocked artery and improve blood flow (angioplasty). ? A procedure to put in a wire mesh tube to keep a blocked artery open (stent implant). ? Surgery to reroute blood flow around a blocked artery (peripheral bypass surgery). ? Surgery to remove dead tissue from an infected wound on the affected limb. ? Amputation. This is surgical removal of the affected limb. It may be necessary in cases of acute limb ischemia where there has been no improvement through medical or surgical treatments. Follow these instructions at home: Lifestyle  Do not use any products that contain nicotine or tobacco, such as cigarettes and e-cigarettes. If you need help quitting, ask your health care provider.  Lose weight if you are overweight, and maintain a healthy weight as discussed by your health care provider.  Eat a diet that is low in fat and cholesterol. If you need help, ask your health care provider.  Exercise regularly. Ask your health care provider to suggest some good activities for you. General instructions  Take over-the-counter and prescription medicines only as told by your health care provider.  Take good care of your feet: ? Wear comfortable shoes that fit well. ? Check your feet often for any cuts or sores.  Keep all follow-up visits as told by your health care provider. This is  important. Contact a health care provider if:  You have cramps in your legs while walking.  You have leg pain when you are at rest.  You have coldness in a leg or foot.  Your skin changes.  You have erectile dysfunction.  You have cuts or sores on your feet that are not healing. Get help right away if:  Your arm or leg turns cold, numb, and blue.  Your arms or legs become red, warm, swollen, painful, or numb.  You have chest pain or trouble breathing.  You suddenly have weakness in your face, arm, or leg.  You become very confused or lose the ability to speak.  You suddenly have a very bad headache or lose your vision. Summary  Peripheral vascular disease (PVD) is a disease of the blood vessels.  In most cases, PVD narrows the blood vessels that carry blood from your heart to the rest of your body.  PVD may cause different symptoms. Your symptoms depend on what part of your body is not getting enough blood.  Treatment for PVD depends on the cause of your condition and how severe your symptoms are. This information is not intended to replace advice given to you by your   health care provider. Make sure you discuss any questions you have with your health care provider. Document Revised: 09/05/2017 Document Reviewed: 10/31/2016 Elsevier Patient Education  [place] are small areas of thickened skin that occur on the top, sides, or tip of a toe. They contain a cone-shaped core with a point that can press on a nerve below. This causes pain.  Calluses are areas of thickened skin that can occur anywhere on the body, including the hands, fingers, palms, soles of the feet, and heels. Calluses are usually larger than corns. What are the causes? Corns and calluses are caused by rubbing (friction) or pressure, such as from shoes that are too tight or do not fit properly. What increases the risk? Corns are more likely to develop in people who have  misshapen toes (toe deformities), such as hammer toes. Calluses can occur with friction to any area of the skin. They are more likely to develop in people who:  Work with their hands.  Wear shoes that fit poorly, are too tight, or are high-heeled.  Have toe deformities. What are the signs or symptoms? Symptoms of a corn or callus include:  A hard growth on the skin.  Pain or tenderness under the skin.  Redness and swelling.  Increased discomfort while wearing tight-fitting shoes, if your feet are affected. If a corn or callus becomes infected, symptoms may include:  Redness and swelling that gets worse.  Pain.  Fluid, blood, or pus draining from the corn or callus. How is this diagnosed? Corns and calluses may be diagnosed based on your symptoms, your medical history, and a physical exam. How is this treated? Treatment for corns and calluses may include:  Removing the cause of the friction or pressure. This may involve: ? Changing your shoes. ? Wearing shoe inserts (orthotics) or other protective layers in your shoes, such as a corn pad. ? Wearing gloves.  Applying medicine to the skin (topical medicine) to help soften skin in the hardened, thickened areas.  Removing layers of dead skin with a file to reduce the size of the corn or callus.  Removing the corn or callus with a scalpel or laser.  Taking antibiotic medicines, if your corn or callus is infected.  Having surgery, if a toe deformity is the cause. Follow these instructions at home:   Take over-the-counter and prescription medicines only as told by your health care provider.  If you were prescribed an antibiotic, take it as told by your health care provider. Do not stop taking it even if your condition starts to improve.  Wear shoes that fit well. Avoid wearing high-heeled shoes and shoes that are too tight or too loose.  Wear any padding, protective layers, gloves, or orthotics as told by your health care  provider.  Soak your hands or feet and then use a file or pumice stone to soften your corn or callus. Do this as told by your health care provider.  Check your corn or callus every day for symptoms of infection. Contact a health care provider if you:  Notice that your symptoms do not improve with treatment.  Have redness or swelling that gets worse.  Notice that your corn or callus becomes painful.  Have fluid, blood, or pus coming from your corn or callus.  Have new symptoms. Summary  Corns are small areas of thickened skin that occur on the top, sides, or tip of a toe.  Calluses are areas of thickened  skin that can occur anywhere on the body, including the hands, fingers, palms, and soles of the feet. Calluses are usually larger than corns.  Corns and calluses are caused by rubbing (friction) or pressure, such as from shoes that are too tight or do not fit properly.  Treatment may include wearing any padding, protective layers, gloves, or orthotics as told by your health care provider. This information is not intended to replace advice given to you by your health care provider. Make sure you discuss any questions you have with your health care provider. Document Revised: 01/13/2019 Document Reviewed: 08/06/2017 Elsevier Patient Education  2020 [employer].

## 2020-01-14 NOTE — Progress Notes (Signed)
Subjective: [name] [name] presents today for follow up of at risk foot care with history of peripheral neuropathy and corn(s) left 4th digit and painful mycotic toenails b/l that are difficult to trim. Pain interferes with ambulation. Aggravating factors include wearing enclosed shoe gear. Pain is relieved with periodic professional debridement.   She has h/o peripheral neuropathy diagnosed in 2015. Found to have adverse reaction to Lyrica. She is using compounded topical neuropathy cream from [place] for her symptoms.  Allergies  Allergen Reactions  . Aspirin Other (See Comments)    sweats  . Codeine Other (See Comments)  . Iodine Other (See Comments)    Pt is not aware of this allergy, does not recall much info.  . Prednisone Other (See Comments)    sweats  . Lyrica [Pregabalin]     Wobbly, "more than sleepy"  . Benzonatate Itching and Rash  . Sulfa Antibiotics Other (See Comments)    Doesn't remember    ROS: Numbness of feet b/l  Objective: Vitals:   01/10/20 1119  Temp: (!) 97.3 F (36.3 C)    Pt 86 y.o. year old female  in NAD. AAO x 3.   Vascular Examination:  Capillary fill time to digits <3 seconds b/l. Palpable DP pulses b/l. Palpable PT pulses b/l. Pedal hair absent b/l Skin temperature gradient within normal limits b/l.  Dermatological Examination: Pedal skin with normal turgor, texture and tone bilaterally. No open wounds bilaterally. No interdigital macerations bilaterally. Toenails 1-5 b/l elongated, dystrophic, thickened, crumbly with subungual debris and tenderness to dorsal palpation. Hyperkeratotic lesion(s) L 4th toe.  No erythema, no edema, no drainage, no flocculence.  Musculoskeletal: Normal muscle strength 5/5 to all lower extremity muscle groups bilaterally, no pain crepitus or joint limitation noted with ROM b/l and hammertoes noted to the  L 4th toe  Neurological: Protective sensation intact 5/5 intact bilaterally with 10g monofilament  b/l Vibratory sensation intact b/l Proprioception intact bilaterally Babinski reflex negative b/l Achilles reflex 2+ b/l  Assessment: 1. Pain due to onychomycosis of toenails of both feet   2. Corns   3. Neuropathy of both feet   4. Hx of long term use of blood thinners    Plan: -Toenails 1-5 b/l were debrided in length and girth with sterile nail nippers and dremel without iatrogenic bleeding.  -Corn(s) debrided L 4th toe without complication or incident. Total number debrided=1. -For neuropathy, continue compounded topical neuropathy cream from [place]. -Patient to continue soft, supportive shoe gear daily. -Patient to report any pedal injuries to medical professional immediately. -Patient/POA to call should there be question/concern in the interim.  Return in about 4 months (around 05/11/2020) for nail and callus trim, patient is taking plavix.

## 2020-01-28 ENCOUNTER — Ambulatory Visit: Payer: Medicare Other | Admitting: Internal Medicine

## 2020-02-03 ENCOUNTER — Other Ambulatory Visit: Payer: Self-pay

## 2020-02-03 ENCOUNTER — Ambulatory Visit (INDEPENDENT_AMBULATORY_CARE_PROVIDER_SITE_OTHER): Payer: Medicare Other | Admitting: Internal Medicine

## 2020-02-03 ENCOUNTER — Encounter: Payer: Self-pay | Admitting: Internal Medicine

## 2020-02-03 VITALS — BP 138/62 | HR 63 | Temp 97.3°F | Ht 62.0 in | Wt 137.8 lb

## 2020-02-03 DIAGNOSIS — I872 Venous insufficiency (chronic) (peripheral): Secondary | ICD-10-CM

## 2020-02-03 DIAGNOSIS — K219 Gastro-esophageal reflux disease without esophagitis: Secondary | ICD-10-CM

## 2020-02-03 DIAGNOSIS — K648 Other hemorrhoids: Secondary | ICD-10-CM | POA: Diagnosis not present

## 2020-02-03 DIAGNOSIS — M1612 Unilateral primary osteoarthritis, left hip: Secondary | ICD-10-CM

## 2020-02-03 DIAGNOSIS — K5904 Chronic idiopathic constipation: Secondary | ICD-10-CM | POA: Diagnosis not present

## 2020-02-03 DIAGNOSIS — K644 Residual hemorrhoidal skin tags: Secondary | ICD-10-CM

## 2020-02-03 MED ORDER — HYDROCORTISONE (PERIANAL) 2.5 % EX CREA: 1.0000 "application " | TOPICAL_CREAM | Freq: Every day | CUTANEOUS | 5 refills | Status: DC | PRN

## 2020-02-03 MED ORDER — DOCUSATE SODIUM 100 MG PO CAPS: 100.0000 mg | ORAL_CAPSULE | Freq: Two times a day (BID) | ORAL | 5 refills | Status: DC

## 2020-02-03 NOTE — Patient Instructions (Addendum)
Try taking colace 100mg  2 tabs daily for constipation.  It should soften your stool.  If two is too much, you may try just one a day and see how that goes.  This will also help prevent hemorrhoids.    You may use tucks pads which are over the counter to wipe your bottom after bms or if the hemorrhoids are itchy.    I have also sent anusol cream to the pharmacy to put on your anus/hemorrhoids to help the itching and prevent pain.  This can be used as needed.

## 2020-02-03 NOTE — Progress Notes (Signed)
Location:  [facility] clinic Provider:  Shantea Poulton [name], D.O., C.M.D.  Code Status: DNR Goals of Care:  Advanced Directives 08/23/2019  Does Patient Have a Medical Advance Directive? Yes  Type of Advance Directive Living will;Healthcare Power of Attorney  Does patient want to make changes to medical advance directive? No - Patient declined  Copy of [place] in Chart? Yes - validated most recent copy scanned in chart (See row information)  Would patient like information on creating a medical advance directive? -  Pre-existing out of facility DNR order (yellow form or pink MOST form) -     Chief Complaint  Patient presents with  . Medical Management of Chronic Issues    4 month follow up    HPI: Patient is a 86 y.o. female seen today for medical management of chronic diseases.    She's been good most of the time.  She started having hemorrhoids.  They are more itchy than painful.  She has just avoided sitting on them too much, but has not used any creams or suppositories or tucks wipes.  She did have them years and years ago.    She does have chronic constipation.  Has not been taking medication for this.    Past Medical History:  Diagnosis Date  . Anxiety   . Asthma   . Benign essential hypertension   . Bradycardia, drug induced 06/11/2016  . Edema extremities   . GERD (gastroesophageal reflux disease)   . Glaucoma   . Macular degeneration    Per patient   . Osteoarthritis   . PVD (peripheral vascular disease) ([place])   . Seasonal allergies   . Stroke [facility])     Past Surgical History:  Procedure Laterality Date  . ABDOMINAL HYSTERECTOMY  1981  . TONSILLECTOMY  1953    Allergies  Allergen Reactions  . Aspirin Other (See Comments)    sweats  . Codeine Other (See Comments)  . Iodine Other (See Comments)    Pt is not aware of this allergy, does not recall much info.  . Prednisone Other (See Comments)    sweats  . Lyrica [Pregabalin]     Wobbly, "more  than sleepy"  . Benzonatate Itching and Rash  . Sulfa Antibiotics Other (See Comments)    Doesn't remember    Outpatient Encounter Medications as of 02/03/2020  Medication Sig  . acetaminophen (TYLENOL) 500 MG tablet Take 500 mg by mouth every 6 (six) hours as needed.  Marland Kitchen albuterol (PROVENTIL HFA;VENTOLIN HFA) 108 (90 Base) MCG/ACT inhaler Inhale 2 puffs into the lungs every 6 (six) hours as needed for wheezing.  Marland Kitchen alum hydroxide-mag trisilicate (GAVISCON) [id number] MG CHEW chewable tablet Chew 2 tablets by mouth 2 (two) times daily.   Marland Kitchen amLODipine (NORVASC) 5 MG tablet TAKE 1 TABLET BY MOUTH EVERY DAY  . atorvastatin (LIPITOR) 10 MG tablet TAKE 1 TABLET BY MOUTH EVERY DAY  . atropine 1 % ophthalmic solution PLACE 1 DROP INTO THE RIGHT EYE DAILY.  . B Complex Vitamins (B COMPLEX PO) Take 1 tablet by mouth daily.  . Brinzolamide-Brimonidine (SIMBRINZA) 1-0.2 % SUSP Apply 1 drop to eye 3 (three) times daily.  . budesonide-formoterol (SYMBICORT) 80-4.5 MCG/ACT inhaler Inhale 2 puffs into the lungs 2 (two) times daily.  . Cholecalciferol (VITAMIN D3) 2000 units TABS Take 2,000 Units by mouth daily.  . clopidogrel (PLAVIX) 75 MG tablet TAKE 1 TABLET (75 MG TOTAL) BY MOUTH DAILY.  Marland Kitchen Coenzyme Q10 (COQ10) 50 MG  CAPS Take 50 mg by mouth daily.  . fluticasone (FLONASE) 50 MCG/ACT nasal spray Place 1 spray into the nose daily.  Marland Kitchen loratadine (CLARITIN) 5 MG chewable tablet Chew 1 tablet (5 mg total) by mouth daily.  . Menthol, Topical Analgesic, (BIOFREEZE) 4 % GEL Apply 1 application topically 3 (three) times daily. Apply to knee  . Multiple Vitamins-Minerals (PRESERVISION AREDS 2) CAPS Take 1 capsule by mouth 2 (two) times daily.  . Multiple Vitamins-Minerals (SENIOR MULTIVITAMIN PLUS) TABS Take 1 tablet by mouth daily.   . NON FORMULARY [facility]  Anti-fungal (nail)-#1 Terbinafine 3%,Fluconazole 2%, tea tree oil 5%,urea 10%,ibuprofen 2% in DMSO Suspension #80ml Sig: Apply to the affected nails  once a day (at bed time). Lattie Haw  . NONFORMULARY OR COMPOUNDED ITEM [facility]:  Peripheral Neuropathy Cream - Bupivacaine 1%, Doxepin 3%, Gabapentin 6%, Pentoxifylline 3%, Topiramate 1%, apply 1-2 grams to affected areas 3-4 times daily prn.  . Omega-3 Fatty Acids (FISH OIL) 1200 MG CPDR Take 1,200 mg by mouth daily. Take one tablet by mouth once daily   . pantoprazole (PROTONIX) 40 MG tablet Take 1 tablet (40 mg total) by mouth 2 (two) times daily before a meal.  . PREDNISOLONE ACETATE OP Place 1 drop into the right eye 3 (three) times daily.  . risperiDONE (RISPERDAL) 0.5 MG tablet TAKE 1 TABLET BY MOUTH TWICE A DAY  . travoprost, benzalkonium, (TRAVATAN) 0.004 % ophthalmic solution Place 1 drop into the left eye at bedtime.  . TROPICAMIDE OP Place 1 drop into the right eye daily.  . vitamin C (ASCORBIC ACID) 500 MG tablet Take 500 mg by mouth daily.  . [DISCONTINUED] Calcium Carbonate-Vitamin D (CALTRATE 600+D) 600-400 MG-UNIT per tablet Take 2 tablets by mouth daily.   No facility-administered encounter medications on file as of 02/03/2020.    Review of Systems:  Review of Systems  Constitutional: Negative for chills, fever and malaise/fatigue.  HENT: Negative for sore throat.   Eyes: Positive for blurred vision.  Respiratory: Negative for cough and shortness of breath.   Cardiovascular: Negative for chest pain, palpitations and leg swelling (wears compression hose to knees).  Gastrointestinal: Positive for constipation. Negative for abdominal pain, blood in stool, diarrhea, heartburn and melena.       Recurrence of hemorrhoids; back on protonix and doing better  Genitourinary: Negative for dysuria.  Musculoskeletal: Positive for joint pain. Negative for falls.       Left hip but managed well with voltaren gel  Skin: Negative for itching and rash.  Neurological: Negative for dizziness and loss of consciousness.  Endo/Heme/Allergies: Bruises/bleeds easily.    Psychiatric/Behavioral: Positive for memory loss. Negative for depression and hallucinations. The patient is not nervous/anxious and does not have insomnia.     Health Maintenance  Topic Date Due  . TETANUS/TDAP  Never done  . MAMMOGRAM  11/19/2019  . INFLUENZA VACCINE  05/07/2020  . DEXA SCAN  Completed  . COVID-19 Vaccine  Completed  . PNA vac Low Risk Adult  Completed    Physical Exam: Vitals:   02/03/20 1101  BP: 138/62  Pulse: 63  Temp: (!) 97.3 F (36.3 C)  TempSrc: Temporal  SpO2: 93%  Weight: 137 lb 12.8 oz (62.5 kg)  Height: 5\' 2"  (1.575 m)   Body mass index is 25.2 kg/m. Physical Exam Vitals reviewed.  Constitutional:      General: She is not in acute distress.    Appearance: Normal appearance. She is not ill-appearing or toxic-appearing.  HENT:  Head: Normocephalic and atraumatic.  Eyes:     Comments: glasses  Cardiovascular:     Rate and Rhythm: Normal rate and regular rhythm.     Heart sounds: Murmur present.  Abdominal:     General: Bowel sounds are normal. There is no distension.     Palpations: There is no mass.     Tenderness: There is no abdominal tenderness. There is no guarding or rebound.  Genitourinary:    Rectum: Guaiac result negative.     Comments: One tiny hemorrhoid visible externally; several internal hemorrhoids--nontender, no signs of bleeding or thrombosis Musculoskeletal:     Right lower leg: No edema.     Left lower leg: No edema.     Comments: Edema controlled with her compression knee highs  Skin:    General: Skin is warm and dry.  Neurological:     General: No focal deficit present.     Mental Status: She is alert and oriented to person, place, and time.     Cranial Nerves: No cranial nerve deficit.     Gait: Gait abnormal.     Comments: Uses cane  Psychiatric:        Mood and Affect: Mood normal.        Behavior: Behavior normal.     Labs reviewed: Basic Metabolic Panel: Recent Labs    04/15/19 1455  08/19/19 1427  NA 138 141  K 4.1 4.2  CL 106 107  CO2 25 21  GLUCOSE 120* 102*  BUN 18 13  CREATININE 0.88 0.91*  CALCIUM 9.5 9.9  MG 2.3  --    Liver Function Tests: Recent Labs    04/15/19 1455 08/19/19 1427  AST 25 27  ALT 21 22  BILITOT 0.9 1.1  PROT 6.8 7.4   No results for input(s): LIPASE, AMYLASE in the last 8760 hours. No results for input(s): AMMONIA in the last 8760 hours. CBC: Recent Labs    04/15/19 1455  WBC 6.2  NEUTROABS 3,522  HGB 11.7  HCT 35.0  MCV 95.9  PLT 209   Lipid Panel: Recent Labs    08/19/19 1427  CHOL 155  HDL 87  LDLCALC 55  TRIG 47  CHOLHDL 1.8   Lab Results  Component Value Date   HGBA1C 5.6 04/22/2016    Procedures since last visit: No results found.  Assessment/Plan 1. Internal hemorrhoids -bothersome recently -explained that we need to get her constipation under control -encouraged fluids as always -will add stool softener as stool has been quite hard--colace - may use tucks pads for inflammation, itching, irritation and anusol cream for itching, too - hydrocortisone (ANUSOL-HC) 2.5 % rectal cream; Place 1 application rectally daily as needed for hemorrhoids or anal itching.  Dispense: 30 g; Refill: 5  2. External hemorrhoid -as in #1, fortunately, not having pain at this point - hydrocortisone (ANUSOL-HC) 2.5 % rectal cream; Place 1 application rectally daily as needed for hemorrhoids or anal itching.  Dispense: 30 g; Refill: 5  3. Chronic idiopathic constipation - will add stool softener b/c she's bothered by the hard stool rather than frequency of bms - docusate sodium (COLACE) 100 MG capsule; Take 1 capsule (100 mg total) by mouth 2 (two) times daily.  Dispense: 60 capsule; Refill: 5  4. Primary osteoarthritis of left hip -doing ok with her voltaren gel  5. Gastroesophageal reflux disease without esophagitis -doing well when stays on PPI so will keep this  6. Venous insufficiency of both lower  extremities -  cont knee high compression hose and elevation of LEs at rest  Labs/tests ordered:  No new Next appt:  06/05/2020   Nickalus Thornsberry L. Aleera Gilcrease, D.O. [facility] [address], [place] [address] Cell Phone (Mon-Fri 8am-5pm):  [phone] On Call:  [phone] & follow prompts after 5pm & weekends Office Phone:  [phone] Office Fax:  [phone]

## 2020-02-24 ENCOUNTER — Other Ambulatory Visit: Payer: Self-pay | Admitting: Cardiology

## 2020-02-26 ENCOUNTER — Other Ambulatory Visit: Payer: Self-pay | Admitting: Internal Medicine

## 2020-02-28 NOTE — Telephone Encounter (Signed)
rx sent to pharmacy by e-script  

## 2020-03-02 ENCOUNTER — Other Ambulatory Visit: Payer: Self-pay | Admitting: Internal Medicine

## 2020-03-08 ENCOUNTER — Ambulatory Visit: Payer: Self-pay | Admitting: Family

## 2020-03-09 ENCOUNTER — Encounter: Payer: Self-pay | Admitting: Family

## 2020-03-09 ENCOUNTER — Ambulatory Visit (INDEPENDENT_AMBULATORY_CARE_PROVIDER_SITE_OTHER): Payer: Medicare Other | Admitting: Family

## 2020-03-09 ENCOUNTER — Telehealth: Payer: Self-pay

## 2020-03-09 ENCOUNTER — Other Ambulatory Visit: Payer: Self-pay

## 2020-03-09 VITALS — BP 128/78 | HR 76 | Temp 97.5°F | Ht 62.0 in | Wt 141.8 lb

## 2020-03-09 DIAGNOSIS — G629 Polyneuropathy, unspecified: Secondary | ICD-10-CM | POA: Diagnosis not present

## 2020-03-09 DIAGNOSIS — R600 Localized edema: Secondary | ICD-10-CM

## 2020-03-09 MED ORDER — NONFORMULARY OR COMPOUNDED ITEM: 5 refills | Status: DC

## 2020-03-09 MED ORDER — GABAPENTIN 100 MG PO CAPS: 100.0000 mg | ORAL_CAPSULE | Freq: Every day | ORAL | 3 refills | Status: DC

## 2020-03-09 NOTE — Telephone Encounter (Signed)
Please refax patient's compounded prescription to patient's request pharmacy [facility].

## 2020-03-09 NOTE — Progress Notes (Signed)
Provider: Shauntea Lok FNP-C  Gayland Curry, DO  Patient Care Team: Gayland Curry, DO as PCP - General (Geriatric Medicine) Sueanne Margarita, MD as PCP - Cardiology (Cardiology) Trula Slade, DPM as Consulting Physician (Podiatry) Bond, Tracie Harrier, MD as Referring Physician (Ophthalmology)  Extended Emergency Contact Information Primary Emergency Contact: Lymon,Carl          [place] [address] [place] Phone: [phone] Mobile Phone: [phone] Relation: Son Secondary Emergency Contact: Stormy Fabian [place] Phone: [phone] Mobile Phone: [phone] Relation: Daughter  Code Status:  DNR Goals of care: Advanced Directive information Advanced Directives 08/23/2019  Does Patient Have a Medical Advance Directive? Yes  Type of Advance Directive Living will;Healthcare Power of Attorney  Does patient want to make changes to medical advance directive? No - Patient declined  Copy of [place] in Chart? Yes - validated most recent copy scanned in chart (See row information)  Would patient like information on creating a medical advance directive? -  Pre-existing out of facility DNR order (yellow form or pink MOST form) -     Chief Complaint  Patient presents with  . Acute Visit    Patient c/o numbness off/on from the knee down (both legs)     HPI:  Pt is a 86 y.o. female seen today for an acute visit for evaluation of  numbness off/on from the knee down (both legs) for about 2 months.Feels heavy and Numb.sometimes symptoms are all day and other times in the evening and other times it's whole day.Has been diagnosed in the past with Neuropathy.She saw a foot doctor who advised her to soak feet in epsom and warm water then rubbed on a cream.    Past Medical History:  Diagnosis Date  . Anxiety   . Asthma   . Benign essential hypertension   . Bradycardia, drug induced 06/11/2016  . Edema extremities    . GERD (gastroesophageal reflux disease)   . Glaucoma   . Macular degeneration    Per patient   . Osteoarthritis   . PVD (peripheral vascular disease) ([place])   . Seasonal allergies   . Stroke [facility])    Past Surgical History:  Procedure Laterality Date  . ABDOMINAL HYSTERECTOMY  1981  . TONSILLECTOMY  1953    Allergies  Allergen Reactions  . Aspirin Other (See Comments)    sweats  . Codeine Other (See Comments)  . Iodine Other (See Comments)    Pt is not aware of this allergy, does not recall much info.  . Prednisone Other (See Comments)    sweats  . Lyrica [Pregabalin]     Wobbly, "more than sleepy"  . Benzonatate Itching and Rash  . Sulfa Antibiotics Other (See Comments)    Doesn't remember    Outpatient Encounter Medications as of 03/09/2020  Medication Sig  . acetaminophen (TYLENOL) 500 MG tablet Take 500 mg by mouth every 6 (six) hours as needed.  Marland Kitchen alum hydroxide-mag trisilicate (GAVISCON) [id number] MG CHEW chewable tablet Chew 2 tablets by mouth 2 (two) times daily.   Marland Kitchen amLODipine (NORVASC) 5 MG tablet Take 1 tablet (5 mg total) by mouth daily. Please make overdue appt with Dr. Radford Pax before anymore refills. 1st attempt  . atorvastatin (LIPITOR) 10 MG tablet TAKE 1 TABLET BY MOUTH EVERY DAY  . atropine 1 % ophthalmic solution PLACE 1 DROP INTO THE RIGHT EYE DAILY.  . B Complex Vitamins (B COMPLEX PO) Take 1 tablet  by mouth daily.  . Brinzolamide-Brimonidine (SIMBRINZA) 1-0.2 % SUSP Apply 1 drop to eye 3 (three) times daily.  . budesonide-formoterol (SYMBICORT) 80-4.5 MCG/ACT inhaler Inhale 2 puffs into the lungs 2 (two) times daily.  . Cholecalciferol (VITAMIN D3) 2000 units TABS Take 2,000 Units by mouth daily.  . clopidogrel (PLAVIX) 75 MG tablet TAKE 1 TABLET BY MOUTH EVERY DAY  . Coenzyme Q10 (COQ10) 50 MG CAPS Take 50 mg by mouth daily.  Marland Kitchen docusate sodium (COLACE) 100 MG capsule Take 1 capsule (100 mg total) by mouth 2 (two) times daily.  . fluticasone (FLONASE) 50  MCG/ACT nasal spray Place 1 spray into the nose daily.  . hydrocortisone (ANUSOL-HC) 2.5 % rectal cream Place 1 application rectally daily as needed for hemorrhoids or anal itching.  . loratadine (CLARITIN) 5 MG chewable tablet Chew 1 tablet (5 mg total) by mouth daily.  . Menthol, Topical Analgesic, (BIOFREEZE) 4 % GEL Apply 1 application topically 3 (three) times daily. Apply to knee  . Multiple Vitamins-Minerals (PRESERVISION AREDS 2) CAPS Take 1 capsule by mouth 2 (two) times daily.  . Multiple Vitamins-Minerals (SENIOR MULTIVITAMIN PLUS) TABS Take 1 tablet by mouth daily.   . NONFORMULARY OR COMPOUNDED ITEM [facility]:  Peripheral Neuropathy Cream - Bupivacaine 1%, Doxepin 3%, Gabapentin 6%, Pentoxifylline 3%, Topiramate 1%, apply 1-2 grams to affected areas 3-4 times daily prn.  . Omega-3 Fatty Acids (FISH OIL) 1200 MG CPDR Take 1,200 mg by mouth daily. Take one tablet by mouth once daily   . pantoprazole (PROTONIX) 40 MG tablet Take 1 tablet (40 mg total) by mouth 2 (two) times daily before a meal.  . PREDNISOLONE ACETATE OP Place 1 drop into the right eye 3 (three) times daily.  . risperiDONE (RISPERDAL) 0.5 MG tablet TAKE 1 TABLET BY MOUTH TWICE A DAY  . travoprost, benzalkonium, (TRAVATAN) 0.004 % ophthalmic solution Place 1 drop into the left eye at bedtime.  . TROPICAMIDE OP Place 1 drop into the right eye daily.  Marland Kitchen albuterol (PROVENTIL HFA;VENTOLIN HFA) 108 (90 Base) MCG/ACT inhaler Inhale 2 puffs into the lungs every 6 (six) hours as needed for wheezing. (Patient not taking: Reported on 03/09/2020)  . vitamin C (ASCORBIC ACID) 500 MG tablet Take 500 mg by mouth daily.  . [DISCONTINUED] NON FORMULARY [facility]  Anti-fungal (nail)-#1 Terbinafine 3%,Fluconazole 2%, tea tree oil 5%,urea 10%,ibuprofen 2% in DMSO Suspension #75ml Sig: Apply to the affected nails once a day (at bed time). Lisa   No facility-administered encounter medications on file as of 03/09/2020.     Review of Systems  Constitutional: Negative for appetite change, chills, fatigue and fever.  Respiratory: Negative for cough, chest tightness, shortness of breath and wheezing.   Cardiovascular: Negative for chest pain, palpitations and leg swelling.  Gastrointestinal: Negative for abdominal distention, abdominal pain, constipation, diarrhea, nausea and vomiting.  Musculoskeletal: Positive for gait problem. Negative for myalgias.  Skin: Negative for color change and pallor.  Neurological: Positive for numbness. Negative for dizziness, speech difficulty, weakness, light-headedness and headaches.       Hx neuropathy on feet and fingers.     Immunization History  Administered Date(s) Administered  . Fluad Quad(high Dose 65+) 06/04/2019  . Influenza, High Dose Seasonal PF 07/16/2017, 07/20/2018  . Influenza,inj,Quad PF,6+ Mos 06/11/2013, 06/30/2014, 07/17/2015, 06/27/2016  . Influenza-Unspecified 07/07/2012  . PFIZER SARS-COV-2 Vaccination 11/20/2019, 12/13/2019  . Pneumococcal Conjugate-13 11/07/2014  . Pneumococcal Polysaccharide-23 10/08/2007  . Zoster Recombinat (Shingrix) 07/31/2017, 12/09/2017   Pertinent  Health  Maintenance Due  Topic Date Due  . MAMMOGRAM  11/19/2019  . INFLUENZA VACCINE  05/07/2020  . DEXA SCAN  Completed  . PNA vac Low Risk Adult  Completed   Fall Risk  02/03/2020 09/27/2019 08/23/2019 08/19/2019 05/27/2019  Falls in the past year? 0 0 0 0 0  Number falls in past yr: 0 0 0 - 0  Injury with Fall? - 0 0 - 0    Vitals:   03/09/20 1301  BP: 128/78  Pulse: 76  Temp: (!) 97.5 F (36.4 C)  TempSrc: Temporal  SpO2: 99%  Weight: 141 lb 12.8 oz (64.3 kg)  Height: 5\' 2"  (1.575 m)   Body mass index is 25.94 kg/m. Physical Exam Constitutional:      General: She is not in acute distress.    Appearance: She is overweight. She is not ill-appearing.  Eyes:     General: No scleral icterus.       Right eye: No discharge.        Left eye: No discharge.      Conjunctiva/sclera: Conjunctivae normal.     Pupils: Pupils are equal, round, and reactive to light.  Cardiovascular:     Rate and Rhythm: Normal rate and regular rhythm.     Pulses: Normal pulses.     Heart sounds: Murmur present. No friction rub. No gallop.   Pulmonary:     Effort: Pulmonary effort is normal. No respiratory distress.     Breath sounds: Normal breath sounds. No wheezing or rales.  Chest:     Chest wall: No tenderness.  Abdominal:     General: Bowel sounds are normal. There is no distension.     Palpations: Abdomen is soft. There is no mass.     Tenderness: There is no abdominal tenderness. There is no right CVA tenderness, left CVA tenderness, guarding or rebound.  Musculoskeletal:        General: No swelling or tenderness.       Feet:     Comments: Unsteady gait walks with a cane.bilateral lower extremities trace edema.Thigh high compression stockings in place.   Feet:     Right foot:     Skin integrity: Skin integrity normal. No ulcer, blister, skin breakdown, erythema, warmth or callus.     Toenail Condition: Right toenails are normal.     Left foot:     Skin integrity: Skin integrity normal. No ulcer, blister, skin breakdown, erythema, warmth or callus.     Toenail Condition: Left toenails are normal.  Skin:    General: Skin is warm.     Coloration: Skin is not pale.     Findings: No erythema or rash.  Neurological:     Mental Status: She is alert.     Sensory: Sensory deficit present.     Motor: No weakness.     Gait: Gait abnormal.     Comments: HOH   Psychiatric:        Mood and Affect: Mood normal.        Thought Content: Thought content normal.        Cognition and Memory: Memory is impaired.     Labs reviewed: Recent Labs    04/15/19 1455 08/19/19 1427  NA 138 141  K 4.1 4.2  CL 106 107  CO2 25 21  GLUCOSE 120* 102*  BUN 18 13  CREATININE 0.88 0.91*  CALCIUM 9.5 9.9  MG 2.3  --    Recent Labs    04/15/19  1455 08/19/19 1427   AST 25 27  ALT 21 22  BILITOT 0.9 1.1  PROT 6.8 7.4   Recent Labs    04/15/19 1455  WBC 6.2  NEUTROABS 3,522  HGB 11.7  HCT 35.0  MCV 95.9  PLT 209   Lab Results  Component Value Date   TSH 2.510 06/09/2014   Lab Results  Component Value Date   HGBA1C 5.6 04/22/2016   Lab Results  Component Value Date   CHOL 155 08/19/2019   HDL 87 08/19/2019   LDLCALC 55 08/19/2019   TRIG 47 08/19/2019   CHOLHDL 1.8 08/19/2019    Significant Diagnostic Results in last 30 days:  No results found.  Assessment/Plan 1. Peripheral polyneuropathy Chronic though states has worsen.Has tried pregabulin but made her sleepy. Request refill for Compounded cream as below. - discussed adding Gabapentin 100 mg capsule at bedtime.Advised to notify provider for any increased sleepiness. - NONFORMULARY OR COMPOUNDED ITEM; [facility]:  Peripheral Neuropathy Cream - Bupivacaine 1%, Doxepin 3%, Gabapentin 6%, Pentoxifylline 3%, Topiramate 1%, apply 1-2 grams to affected areas 3-4 times daily prn.  Dispense: 100 each; Refill: 5 - gabapentin (NEURONTIN) 100 MG capsule; Take 1 capsule (100 mg total) by mouth at bedtime.  Dispense: 90 capsule; Refill: 3  2. Edema of lower extremities  Stable.no abrupt weight gain.Continue to wear knee high compression stockings.   Family/ staff Communication: Reviewed plan of care with patient verbalized understanding.   Labs/tests ordered: None   Next Appointment: PRN and Has follow up 4 month appointment with Dr.Reed   Sandrea Hughs, NP

## 2020-03-09 NOTE — Telephone Encounter (Signed)
Prescription for compound sent to incorrect pharmacy was suppose to be sent to [facility]

## 2020-03-09 NOTE — Patient Instructions (Signed)
Take Gabapentin 100 mg tablet one by mouth at bedtime.may take as needed if no symptom.Notify for any drowsiness.   - continue with Neuropathy cream three times as needed   Neuropathic Pain Neuropathic pain is pain caused by damage to the nerves that are responsible for certain sensations in your body (sensory nerves). The pain can be caused by:  Damage to the sensory nerves that send signals to your spinal cord and brain (peripheral nervous system).  Damage to the sensory nerves in your brain or spinal cord (central nervous system). Neuropathic pain can make you more sensitive to pain. Even a minor sensation can feel very painful. This is usually a long-term condition that can be difficult to treat. The type of pain differs from person to person. It may:  Start suddenly (acute), or it may develop slowly and last for a long time (chronic).  Come and go as damaged nerves heal, or it may stay at the same level for years.  Cause emotional distress, loss of sleep, and a lower quality of life. What are the causes? The most common cause of this condition is diabetes. Many other diseases and conditions can also cause neuropathic pain. Causes of neuropathic pain can be classified as:  Toxic. This is caused by medicines and chemicals. The most common cause of toxic neuropathic pain is damage from cancer treatments (chemotherapy).  Metabolic. This can be caused by: ? Diabetes. This is the most common disease that damages the nerves. ? Lack of vitamin B from long-term alcohol abuse.  Traumatic. Any injury that cuts, crushes, or stretches a nerve can cause damage and pain. A common example is feeling pain after losing an arm or leg (phantom limb pain).  Compression-related. If a sensory nerve gets trapped or compressed for a long period of time, the blood supply to the nerve can be cut off.  Vascular. Many blood vessel diseases can cause neuropathic pain by decreasing blood supply and oxygen to  nerves.  Autoimmune. This type of pain results from diseases in which the body's defense system (immune system) mistakenly attacks sensory nerves. Examples of autoimmune diseases that can cause neuropathic pain include lupus and multiple sclerosis.  Infectious. Many types of viral infections can damage sensory nerves and cause pain. Shingles infection is a common cause of this type of pain.  Inherited. Neuropathic pain can be a symptom of many diseases that are passed down through families (genetic). What increases the risk? You are more likely to develop this condition if:  You have diabetes.  You smoke.  You drink too much alcohol.  You are taking certain medicines, including medicines that kill cancer cells (chemotherapy) or that treat immune system disorders. What are the signs or symptoms? The main symptom is pain. Neuropathic pain is often described as:  Burning.  Shock-like.  Stinging.  Hot or cold.  Itching. How is this diagnosed? No single test can diagnose neuropathic pain. It is diagnosed based on:  Physical exam and your symptoms. Your health care provider will ask you about your pain. You may be asked to use a pain scale to describe how bad your pain is.  Tests. These may be done to see if you have a high sensitivity to pain and to help find the cause and location of any sensory nerve damage. They include: ? Nerve conduction studies to test how well nerve signals travel through your sensory nerves (electrodiagnostic testing). ? Stimulating your sensory nerves through electrodes on your skin and measuring the  response in your spinal cord and brain (somatosensory evoked potential).  Imaging studies, such as: ? X-rays. ? CT scan. ? MRI. How is this treated? Treatment for neuropathic pain may change over time. You may need to try different treatment options or a combination of treatments. Some options include:  Treating the underlying cause of the neuropathy,  such as diabetes, kidney disease, or vitamin deficiencies.  Stopping medicines that can cause neuropathy, such as chemotherapy.  Medicine to relieve pain. Medicines may include: ? Prescription or over-the-counter pain medicine. ? Anti-seizure medicine. ? Antidepressant medicines. ? Pain-relieving patches that are applied to painful areas of skin. ? A medicine to numb the area (local anesthetic), which can be injected as a nerve block.  Transcutaneous nerve stimulation. This uses electrical currents to block painful nerve signals. The treatment is painless.  Alternative treatments, such as: ? Acupuncture. ? Meditation. ? Massage. ? Physical therapy. ? Pain management programs. ? Counseling. Follow these instructions at home: Medicines   Take over-the-counter and prescription medicines only as told by your health care provider.  Do not drive or use heavy machinery while taking prescription pain medicine.  If you are taking prescription pain medicine, take actions to prevent or treat constipation. Your health care provider may recommend that you: ? Drink enough fluid to keep your urine pale yellow. ? Eat foods that are high in fiber, such as fresh fruits and vegetables, whole grains, and beans. ? Limit foods that are high in fat and processed sugars, such as fried or sweet foods. ? Take an over-the-counter or prescription medicine for constipation. Lifestyle   Have a good support system at home.  Consider joining a chronic pain support group.  Do not use any products that contain nicotine or tobacco, such as cigarettes and e-cigarettes. If you need help quitting, ask your health care provider.  Do not drink alcohol. General instructions  Learn as much as you can about your condition.  Work closely with all your health care providers to find the treatment plan that works best for you.  Ask your health care provider what activities are safe for you.  Keep all follow-up  visits as told by your health care provider. This is important. Contact a health care provider if:  Your pain treatments are not working.  You are having side effects from your medicines.  You are struggling with tiredness (fatigue), mood changes, depression, or anxiety. Summary  Neuropathic pain is pain caused by damage to the nerves that are responsible for certain sensations in your body (sensory nerves).  Neuropathic pain may come and go as damaged nerves heal, or it may stay at the same level for years.  Neuropathic pain is usually a long-term condition that can be difficult to treat. Consider joining a chronic pain support group. This information is not intended to replace advice given to you by your health care provider. Make sure you discuss any questions you have with your health care provider. Document Revised: 01/14/2019 Document Reviewed: 10/10/2017 Elsevier Patient Education  [place].

## 2020-03-10 NOTE — Telephone Encounter (Signed)
Patient prescription faxed.

## 2020-03-24 ENCOUNTER — Other Ambulatory Visit: Payer: Self-pay | Admitting: Cardiology

## 2020-03-31 ENCOUNTER — Telehealth: Payer: Self-pay | Admitting: Physician Assistant

## 2020-03-31 NOTE — Telephone Encounter (Signed)
Daughter of the patient would like to know if the patient would be allowed to have a visitor come with her to the office. The patient has a vision impairment and can not see without the help of another person. Please let the daughter know what the office decides   The daughter says the patient will not come in for her appointment unless she has someone come with her.

## 2020-03-31 NOTE — Telephone Encounter (Signed)
Spoke with daughter and made her aware that I have placed a note in the chart that it is ok for one person to accompany the pt to her appt.  Daughter appreciative for call.

## 2020-04-03 NOTE — Progress Notes (Signed)
Cardiology Office Note    Date:  04/05/2020   ID:  [name], [name] Aug 11, 1924, MRN [id number]  PCP:  Gayland Curry, DO  Cardiologist: Fransico Him, MD EPS: None  No chief complaint on file.   History of Present Illness:  [name] [name] is a 86 y.o. female with history of hypertension, bradycardia, chronic lower extremity edema and asthma.  Patient was seen in our office 11/2018 for atypical chest pain felt to be musculoskeletal.  Patient was given reassurance.  BP Was elevated and they discussed possibly increasing amlodipine but the patient and daughter wanted to continue to measure at home.  Patient walked in today by herself with a walker. Patient feels like a pea sized fleeting sensation in left chest that feels like something is dancing in her chest. A little sore to touch and it jumps around. Denies chest tightness or pressure, shortness of breath, dizziness or presyncope. Denies palpitations or heart racing. Occurs at rest and occurs at random times. BP runs 140 at home. Lives with her son.   Past Medical History:  Diagnosis Date  . Anxiety   . Asthma   . Benign essential hypertension   . Bradycardia, drug induced 06/11/2016  . Edema extremities   . GERD (gastroesophageal reflux disease)   . Glaucoma   . Macular degeneration    Per patient   . Osteoarthritis   . PVD (peripheral vascular disease) ([place])   . Seasonal allergies   . Stroke [facility])     Past Surgical History:  Procedure Laterality Date  . ABDOMINAL HYSTERECTOMY  1981  . TONSILLECTOMY  1953    Current Medications: Current Meds  Medication Sig  . acetaminophen (TYLENOL) 500 MG tablet Take 500 mg by mouth every 6 (six) hours as needed.  Marland Kitchen albuterol (PROVENTIL HFA;VENTOLIN HFA) 108 (90 Base) MCG/ACT inhaler Inhale 2 puffs into the lungs every 6 (six) hours as needed for wheezing.  Marland Kitchen alum hydroxide-mag trisilicate (GAVISCON) 37-90 MG CHEW chewable tablet Chew 2 tablets by mouth 2 (two) times daily.     Marland Kitchen amLODipine (NORVASC) 5 MG tablet Take 1 tablet (5 mg total) by mouth daily. 2nd attempt. MUST CALL [phone] TO SCHEDULE AN OVERDUE F/U TO CONT REFILLS. Thank you.  Marland Kitchen atorvastatin (LIPITOR) 10 MG tablet TAKE 1 TABLET BY MOUTH EVERY DAY  . atropine 1 % ophthalmic solution PLACE 1 DROP INTO THE RIGHT EYE DAILY.  . B Complex Vitamins (B COMPLEX PO) Take 1 tablet by mouth daily.  . Brinzolamide-Brimonidine (SIMBRINZA) 1-0.2 % SUSP Apply 1 drop to eye 3 (three) times daily.  . budesonide-formoterol (SYMBICORT) 80-4.5 MCG/ACT inhaler Inhale 2 puffs into the lungs 2 (two) times daily.  . Cholecalciferol (VITAMIN D3) 2000 units TABS Take 2,000 Units by mouth daily.  . clopidogrel (PLAVIX) 75 MG tablet TAKE 1 TABLET BY MOUTH EVERY DAY  . Coenzyme Q10 (COQ10) 50 MG CAPS Take 50 mg by mouth daily.  Marland Kitchen docusate sodium (COLACE) 100 MG capsule Take 1 capsule (100 mg total) by mouth 2 (two) times daily.  . fluticasone (FLONASE) 50 MCG/ACT nasal spray Place 1 spray into the nose daily.  Marland Kitchen gabapentin (NEURONTIN) 100 MG capsule Take 1 capsule (100 mg total) by mouth at bedtime.  . hydrocortisone (ANUSOL-HC) 2.5 % rectal cream Place 1 application rectally daily as needed for hemorrhoids or anal itching.  . loratadine (CLARITIN) 5 MG chewable tablet Chew 1 tablet (5 mg total) by mouth daily.  . Menthol, Topical Analgesic, (BIOFREEZE) 4 %  GEL Apply 1 application topically 3 (three) times daily. Apply to knee  . Multiple Vitamins-Minerals (PRESERVISION AREDS 2) CAPS Take 1 capsule by mouth 2 (two) times daily.  . Multiple Vitamins-Minerals (SENIOR MULTIVITAMIN PLUS) TABS Take 1 tablet by mouth daily.   . NONFORMULARY OR COMPOUNDED ITEM [facility]:  Peripheral Neuropathy Cream - Bupivacaine 1%, Doxepin 3%, Gabapentin 6%, Pentoxifylline 3%, Topiramate 1%, apply 1-2 grams to affected areas 3-4 times daily prn.  . Omega-3 Fatty Acids (FISH OIL) 1200 MG CPDR Take 1,200 mg by mouth daily. Take one tablet by  mouth once daily   . pantoprazole (PROTONIX) 40 MG tablet Take 1 tablet (40 mg total) by mouth 2 (two) times daily before a meal.  . PREDNISOLONE ACETATE OP Place 1 drop into the right eye 3 (three) times daily.  . risperiDONE (RISPERDAL) 0.5 MG tablet TAKE 1 TABLET BY MOUTH TWICE A DAY  . travoprost, benzalkonium, (TRAVATAN) 0.004 % ophthalmic solution Place 1 drop into the left eye at bedtime.  . TROPICAMIDE OP Place 1 drop into the right eye daily.  . vitamin C (ASCORBIC ACID) 500 MG tablet Take 500 mg by mouth daily.     Allergies:   Aspirin, Codeine, Iodine, Prednisone, Lyrica [pregabalin], Benzonatate, and Sulfa antibiotics   Social History   Socioeconomic History  . Marital status: Widowed    Spouse name: Not on file  . Number of children: Not on file  . Years of education: Not on file  . Highest education level: Not on file  Occupational History  . Not on file  Tobacco Use  . Smoking status: Never Smoker  . Smokeless tobacco: Never Used  Vaping Use  . Vaping Use: Never used  Substance and Sexual Activity  . Alcohol use: No    Alcohol/week: 0.0 standard drinks  . Drug use: No  . Sexual activity: Not Currently  Other Topics Concern  . 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 Difficulty of Paying Living Expenses:   Food Insecurity:   . Worried About Charity fundraiser in the Last Year:   . Arboriculturist in the Last Year:   Transportation Needs:   . Film/video editor (Medical):   Marland Kitchen Lack of Transportation (Non-Medical):   Physical Activity:   . Days of Exercise per Week:   . Minutes of Exercise per Session:   Stress:   . Feeling of Stress :   Social Connections:   . Frequency of Communication with Friends and Family:   . Frequency of Social Gatherings with Friends and Family:   . Attends Religious  Services:   . Active Member of Clubs or Organizations:   . Attends Archivist Meetings:   Marland Kitchen Marital Status:      Family History:  The patient's  family history includes Diabetes in her mother; Heart disease in her mother; Hypertension in her father and mother; Stroke in her father.   ROS:   Please see the history of present illness.    ROS All other systems reviewed and are negative.   PHYSICAL EXAM:   VS:  BP (!) 150/78   Pulse 78   Ht 5\' 2"  (1.575 m)   Wt 138 lb 3.2 oz (62.7 kg)   SpO2 96%   BMI 25.28 kg/m  Physical Exam  GEN: Well nourished, well developed, in no acute distress  Neck: no JVD, carotid bruits, or masses Cardiac:chest mildly tender to palpation.RRR; S4, 2/6 systolic murmur at LSB and apex Respiratory:  clear to auscultation bilaterally, normal work of breathing GI: soft, nontender, nondistended, + BS Ext: without cyanosis, clubbing, or edema, Good distal pulses bilaterally Neuro:  Alert and Oriented x 3 Psych: euthymic mood, full affect  Wt Readings from Last 3 Encounters:  04/05/20 138 lb 3.2 oz (62.7 kg)  03/09/20 141 lb 12.8 oz (64.3 kg)  02/03/20 137 lb 12.8 oz (62.5 kg)      Studies/Labs Reviewed:   EKG:  EKG is  ordered today.  The ekg ordered today demonstrates NSR with First Degree AV block RBBB  Recent Labs: 04/15/2019: Hemoglobin 11.7; Magnesium 2.3; Platelets 209 08/19/2019: ALT 22; BUN 13; Creat 0.91; Potassium 4.2; Sodium 141   Lipid Panel    Component Value Date/Time   CHOL 155 08/19/2019 1427   CHOL 187 08/17/2013 0836   TRIG 47 08/19/2019 1427   HDL 87 08/19/2019 1427   HDL 105 08/17/2013 0836   CHOLHDL 1.8 08/19/2019 1427   VLDL 9 03/24/2017 1200   LDLCALC 55 08/19/2019 1427    Additional studies/ records that were reviewed today include:   Echo 07/30/18 Study Conclusions   - Left ventricle: The cavity size was normal. Wall thickness was    normal. Systolic function was normal. The estimated ejection     fraction was in the range of 60% to 65%. Wall motion was normal;    there were no regional wall motion abnormalities.  - Pulmonic valve: There was moderate regurgitation.  - Pulmonary arteries: Systolic pressure was mildly increased. PA    peak pressure: 32 mm Hg (S).   Impressions:   - No cardiac source of emboli was indentified. Compared to the    prior study, there has been no significant interval change.     ASSESSMENT:    1. Essential hypertension   2. Bradycardia, drug induced   3. RBBB   4. Other chest pain   5. Mitral valve insufficiency, unspecified etiology      PLAN:  In order of problems listed above:  Hypertension blood pressure little on the high side but usually 140 at home but given her age would not increase amlodipine  Bradycardia heart rate 78 today with right bundle branch block block.  Asymptomatic  RBBB chronic  Chest pain atypical and appears to be musculoskeletal.  Tender to touch and jumps around.  No further work-up.  Moderate MR echo in 2019  Asymptomatic.    Medication Adjustments/Labs and Tests Ordered: Current medicines are reviewed at length with the patient today.  Concerns regarding medicines are outlined above.  Medication changes, Labs and Tests ordered today are listed in the Patient Instructions below. Patient Instructions  Medication Instructions:  Your physician recommends that you continue on your current medications as directed. Please refer to the Current Medication list given to you today.  *If you need a refill on your cardiac medications before your next appointment, please call your pharmacy*   Lab Work: None  If you have labs (blood work) drawn today and your tests are completely normal, you will receive your results only by: Marland Kitchen MyChart Message (if you have MyChart) OR . A paper copy in the mail If you have any lab test that is abnormal or we need to change your treatment, we will call you to review the  results.   Testing/Procedures: None   Follow-Up: At [facility], you and your health needs are our priority.  As part of our continuing mission to provide you with exceptional heart care, we have created designated Provider Care Teams.  These Care Teams include your primary Cardiologist (physician) and Advanced Practice Providers (APPs -  Physician Assistants and Nurse Practitioners) who all work together to provide you with the care you need, when you need it.  We recommend signing up for the patient portal called "MyChart".  Sign up information is provided on this After Visit Summary.  MyChart is used to connect with patients for Virtual Visits (Telemedicine).  Patients are able to view lab/test results, encounter notes, upcoming appointments, etc.  Non-urgent messages can be sent to your provider as well.   To learn more about what you can do with MyChart, go to [email or web address].    Your next appointment:   12 month(s)  The format for your next appointment:   In Person  Provider:   You may see Fransico Him, MD or one of the following Advanced Practice Providers on your designated Care Team:    Melina Copa, PA-C  Ermalinda Barrios, PA-C    Other Instructions None     Signed, Ermalinda Barrios, PA-C  04/05/2020 11:48 AM    [address], [place], [place]  [address] Phone: [phone]; Fax: [phone]

## 2020-04-05 ENCOUNTER — Ambulatory Visit (INDEPENDENT_AMBULATORY_CARE_PROVIDER_SITE_OTHER): Payer: Medicare Other | Admitting: Physician Assistant

## 2020-04-05 ENCOUNTER — Other Ambulatory Visit: Payer: Self-pay

## 2020-04-05 ENCOUNTER — Encounter: Payer: Self-pay | Admitting: Physician Assistant

## 2020-04-05 VITALS — BP 150/78 | HR 78 | Ht 62.0 in | Wt 138.2 lb

## 2020-04-05 DIAGNOSIS — I340 Nonrheumatic mitral (valve) insufficiency: Secondary | ICD-10-CM

## 2020-04-05 DIAGNOSIS — R0789 Other chest pain: Secondary | ICD-10-CM | POA: Diagnosis not present

## 2020-04-05 DIAGNOSIS — T50905A Adverse effect of unspecified drugs, medicaments and biological substances, initial encounter: Secondary | ICD-10-CM

## 2020-04-05 DIAGNOSIS — I4510 Unspecified right bundle-branch block: Secondary | ICD-10-CM

## 2020-04-05 DIAGNOSIS — I10 Essential (primary) hypertension: Secondary | ICD-10-CM | POA: Diagnosis not present

## 2020-04-05 DIAGNOSIS — R001 Bradycardia, unspecified: Secondary | ICD-10-CM

## 2020-04-05 NOTE — Patient Instructions (Signed)
Medication Instructions:  Your physician recommends that you continue on your current medications as directed. Please refer to the Current Medication list given to you today.  *If you need a refill on your cardiac medications before your next appointment, please call your pharmacy*   Lab Work: None  If you have labs (blood work) drawn today and your tests are completely normal, you will receive your results only by: Marland Kitchen MyChart Message (if you have MyChart) OR . A paper copy in the mail If you have any lab test that is abnormal or we need to change your treatment, we will call you to review the results.   Testing/Procedures: None   Follow-Up: At [facility], you and your health needs are our priority.  As part of our continuing mission to provide you with exceptional heart care, we have created designated Provider Care Teams.  These Care Teams include your primary Cardiologist (physician) and Advanced Practice Providers (APPs -  Physician Assistants and Nurse Practitioners) who all work together to provide you with the care you need, when you need it.  We recommend signing up for the patient portal called "MyChart".  Sign up information is provided on this After Visit Summary.  MyChart is used to connect with patients for Virtual Visits (Telemedicine).  Patients are able to view lab/test results, encounter notes, upcoming appointments, etc.  Non-urgent messages can be sent to your provider as well.   To learn more about what you can do with MyChart, go to [email or web address].    Your next appointment:   12 month(s)  The format for your next appointment:   In Person  Provider:   You may see Fransico Him, MD or one of the following Advanced Practice Providers on your designated Care Team:    Melina Copa, PA-C  Ermalinda Barrios, PA-C    Other Instructions None

## 2020-04-06 ENCOUNTER — Other Ambulatory Visit: Payer: Self-pay | Admitting: Cardiology

## 2020-04-21 ENCOUNTER — Emergency Department (HOSPITAL_COMMUNITY)
Admission: EM | Admit: 2020-04-21 | Discharge: 2020-04-21 | Disposition: A | Discharge status: Home / Self Care | Payer: Medicare Other | Attending: Emergency Medicine | Admitting: Emergency Medicine

## 2020-04-21 ENCOUNTER — Encounter (HOSPITAL_COMMUNITY): Payer: Self-pay | Admitting: Emergency Medicine

## 2020-04-21 ENCOUNTER — Telehealth: Payer: Self-pay | Admitting: *Deleted

## 2020-04-21 DIAGNOSIS — R61 Generalized hyperhidrosis: Secondary | ICD-10-CM | POA: Diagnosis not present

## 2020-04-21 DIAGNOSIS — R531 Weakness: Secondary | ICD-10-CM | POA: Diagnosis not present

## 2020-04-21 DIAGNOSIS — J4520 Mild intermittent asthma, uncomplicated: Secondary | ICD-10-CM | POA: Diagnosis not present

## 2020-04-21 DIAGNOSIS — Z8673 Personal history of transient ischemic attack (TIA), and cerebral infarction without residual deficits: Secondary | ICD-10-CM | POA: Diagnosis not present

## 2020-04-21 DIAGNOSIS — I4510 Unspecified right bundle-branch block: Secondary | ICD-10-CM | POA: Diagnosis not present

## 2020-04-21 DIAGNOSIS — Z7951 Long term (current) use of inhaled steroids: Secondary | ICD-10-CM | POA: Diagnosis not present

## 2020-04-21 DIAGNOSIS — W19XXXA Unspecified fall, initial encounter: Secondary | ICD-10-CM | POA: Diagnosis not present

## 2020-04-21 DIAGNOSIS — Z79899 Other long term (current) drug therapy: Secondary | ICD-10-CM | POA: Diagnosis not present

## 2020-04-21 DIAGNOSIS — Z7952 Long term (current) use of systemic steroids: Secondary | ICD-10-CM | POA: Insufficient documentation

## 2020-04-21 DIAGNOSIS — I10 Essential (primary) hypertension: Secondary | ICD-10-CM | POA: Insufficient documentation

## 2020-04-21 LAB — URINALYSIS, ROUTINE W REFLEX MICROSCOPIC
Bilirubin Urine: NEGATIVE
Glucose, UA: NEGATIVE mg/dL
Hgb urine dipstick: NEGATIVE
Ketones, ur: NEGATIVE mg/dL
Leukocytes,Ua: NEGATIVE
Nitrite: NEGATIVE
Protein, ur: NEGATIVE mg/dL
Specific Gravity, Urine: 1.01 (ref 1.005–1.030)
pH: 7 (ref 5.0–8.0)

## 2020-04-21 LAB — CBC
HCT: 34.9 % — ABNORMAL LOW (ref 36.0–46.0)
Hemoglobin: 11.8 g/dL — ABNORMAL LOW (ref 12.0–15.0)
MCH: 32.7 pg (ref 26.0–34.0)
MCHC: 33.8 g/dL (ref 30.0–36.0)
MCV: 96.7 fL (ref 80.0–100.0)
Platelets: 194 10*3/uL (ref 150–400)
RBC: 3.61 MIL/uL — ABNORMAL LOW (ref 3.87–5.11)
RDW: 14.7 % (ref 11.5–15.5)
WBC: 10.2 10*3/uL (ref 4.0–10.5)
nRBC: 0 % (ref 0.0–0.2)

## 2020-04-21 LAB — CBG MONITORING, ED: Glucose-Capillary: 117 mg/dL — ABNORMAL HIGH (ref 70–99)

## 2020-04-21 LAB — BASIC METABOLIC PANEL
Anion gap: 11 (ref 5–15)
BUN: 9 mg/dL (ref 8–23)
CO2: 19 mmol/L — ABNORMAL LOW (ref 22–32)
Calcium: 9.2 mg/dL (ref 8.9–10.3)
Chloride: 109 mmol/L (ref 98–111)
Creatinine, Ser: 0.91 mg/dL (ref 0.44–1.00)
GFR calc Af Amer: >60 mL/min (ref >60)
GFR calc non Af Amer: 54 mL/min — ABNORMAL LOW (ref >60)
Glucose, Bld: 150 mg/dL — ABNORMAL HIGH (ref 70–99)
Potassium: 3.6 mmol/L (ref 3.5–5.1)
Sodium: 139 mmol/L (ref 135–145)

## 2020-04-21 MED ORDER — SODIUM CHLORIDE 0.9% FLUSH: 3.0000 mL | Freq: Once | INTRAVENOUS | Status: DC

## 2020-04-21 MED ORDER — AMOXICILLIN 500 MG PO CAPS
500.0000 mg | ORAL_CAPSULE | Freq: Three times a day (TID) | ORAL | 0 refills | Status: DC
Start: 2020-04-21 — End: 2020-06-05

## 2020-04-21 NOTE — Discharge Instructions (Signed)
Your testing is all normal, I suspect that your symptoms are related to the pain medication that you took, please avoid taking that, instead take either Tylenol or ibuprofen.  I will add an antibiotic to help treat your post dental surgical swelling which may be related to an early infection as well.  Please return to the emergency department for severe or worsening symptoms but be aware that she may be weak and dehydrated today so please take lots of fluids by mouth and rest in bed

## 2020-04-21 NOTE — ED Notes (Signed)
[facility] called @ 1133-per Benjamine Mola, RN called by Levada Dy

## 2020-04-21 NOTE — ED Provider Notes (Signed)
[facility]PART[facility] Provider Note   CSN: [id number] Arrival date & time: 04/21/20  0439     History Chief Complaint  Patient presents with  . Weakness    [name] [name] is a 86 y.o. female.  HPI   This patient is a 86 year old female, she has a history of hypertension, history of acid reflux and macular degeneration which is left her with blindness in her right eye and "mostly blind" in her left eye.  She also has a history of stroke according to the medical record.  The patient had her left lower wisdom tooth extracted yesterday at the dentist, she was given a pain medication and took this last night before going to bed, took another one this morning.  The patient reports that she got up to walk to the bathroom, she felt like her knees were giving out so she called for help, was lowered to the ground with her son who lives with her.  This weakness is in her bilateral knees.  She reports that she is "knees giving out" for the last couple of years, it gets better when she rubs BenGay on them and she has improved recently walking with a cane instead of a walker.  She denies any other systemic symptoms including nausea, vomiting, fevers or chills and she denies any fevers or significant pain in her jaw after the tooth extraction.  She arrives by paramedic transport  Reports from paramedics were that the patient became very sweaty and pale when she tried to get to the paramedic stretcher and truck, this improved by the time she arrived to the hospital.  There is no significant abnormal vital signs other than hypertension.  She had been taking oxycodone last night.  There is no focal deficits noted by paramedics  Past Medical History:  Diagnosis Date  . Anxiety   . Asthma   . Benign essential hypertension   . Bradycardia, drug induced 06/11/2016  . Edema extremities   . GERD (gastroesophageal reflux disease)   . Glaucoma   . Macular degeneration    Per  patient   . Osteoarthritis   . PVD (peripheral vascular disease) ([place])   . Seasonal allergies   . Stroke [facility])     Patient Active Problem List   Diagnosis Date Noted  . Leg cramps 05/27/2019  . Bilateral primary osteoarthritis of knee 05/27/2019  . Macular degeneration of left eye 02/12/2018  . Episode of visual loss of left eye 09/03/2017  . Primary open angle glaucoma (POAG) of right eye, severe stage 09/03/2017  . Bradycardia, drug induced 06/11/2016  . Hyperlipidemia 02/06/2016  . Headache 12/14/2015  . Cerebral infarction due to embolism of cerebral artery ([place])   . Acute CVA (cerebrovascular accident) ([place]) 12/09/2015  . Dizziness 12/09/2015  . CVA (cerebral infarction) 12/09/2015  . Asthma, mild intermittent 04/04/2014  . Gastroesophageal reflux disease without esophagitis 04/04/2014  . Neuropathy of both feet 04/04/2014  . Diuretic-induced hypokalemia 01/03/2014  . Olfactory hallucinations 11/23/2013  . SOB (shortness of breath) 10/26/2013  . Edema of extremities 10/26/2013  . Essential hypertension   . Osteoarthritis   . Seasonal allergies   . Glaucoma   . PVD (peripheral vascular disease) ([place])   . Anxiety     Past Surgical History:  Procedure Laterality Date  . ABDOMINAL HYSTERECTOMY  1981  . TONSILLECTOMY  1953     OB History   No obstetric history on file.     Family History  Problem Relation Age of Onset  . Stroke Father   . Hypertension Father   . Heart disease Mother   . Hypertension Mother   . Diabetes Mother     Social History   Tobacco Use  . Smoking status: Never Smoker  . Smokeless tobacco: Never Used  Vaping Use  . Vaping Use: Never used  Substance Use Topics  . Alcohol use: No    Alcohol/week: 0.0 standard drinks  . Drug use: No    Home Medications Prior to Admission medications   Medication Sig Start Date End Date Taking? Authorizing Provider  acetaminophen (TYLENOL) 500 MG tablet Take 500 mg by mouth every 6 (six) hours  as needed for mild pain or moderate pain.    Yes [provider]  albuterol (PROVENTIL HFA;VENTOLIN HFA) 108 (90 Base) MCG/ACT inhaler Inhale 2 puffs into the lungs every 6 (six) hours as needed for wheezing. 11/27/17  Yes Reed, Tiffany L, DO  alum hydroxide-mag trisilicate (GAVISCON) 46-27 MG CHEW chewable tablet Chew 2 tablets by mouth 2 (two) times daily.    Yes [provider]  amLODipine (NORVASC) 5 MG tablet Take 1 tablet (5 mg total) by mouth daily. 04/07/20  Yes Turner, Eber Hong, MD  atorvastatin (LIPITOR) 10 MG tablet TAKE 1 TABLET BY MOUTH EVERY DAY Patient taking differently: Take 10 mg by mouth daily.  02/28/20  Yes Reed, Tiffany L, DO  atropine 1 % ophthalmic solution Place 1 drop into the right eye daily.  06/30/19  Yes [provider]  B Complex Vitamins (B COMPLEX PO) Take 1 tablet by mouth daily.   Yes [provider]  Brinzolamide-Brimonidine (SIMBRINZA) 1-0.2 % SUSP Apply 1 drop to eye 3 (three) times daily.   Yes [provider]  budesonide-formoterol (SYMBICORT) 80-4.5 MCG/ACT inhaler Inhale 2 puffs into the lungs 2 (two) times daily.   Yes [provider]  Cholecalciferol (VITAMIN D3) 2000 units TABS Take 2,000 Units by mouth daily.   Yes [provider]  clopidogrel (PLAVIX) 75 MG tablet TAKE 1 TABLET BY MOUTH EVERY DAY Patient taking differently: Take 75 mg by mouth daily.  03/02/20  Yes Reed, Tiffany L, DO  Coenzyme Q10 (COQ10) 50 MG CAPS Take 50 mg by mouth daily.   Yes [provider]  docusate sodium (COLACE) 100 MG capsule Take 1 capsule (100 mg total) by mouth 2 (two) times daily. 02/03/20  Yes Reed, Tiffany L, DO  fluticasone (FLONASE) 50 MCG/ACT nasal spray Place 1 spray into the nose daily.   Yes [provider]  hydrocortisone (ANUSOL-HC) 2.5 % rectal cream Place 1 application rectally daily as needed for hemorrhoids or anal itching. 02/03/20  Yes Reed, Tiffany L, DO  Menthol, Topical Analgesic,  (BIOFREEZE) 4 % GEL Apply 1 application topically 3 (three) times daily. Apply to knee 04/13/19  Yes Ngetich, Dinah C, NP  Multiple Vitamins-Minerals (PRESERVISION AREDS 2) CAPS Take 1 capsule by mouth 2 (two) times daily.   Yes [provider]  Multiple Vitamins-Minerals (SENIOR MULTIVITAMIN PLUS) TABS Take 1 tablet by mouth daily.    Yes [provider]  NONFORMULARY OR COMPOUNDED ITEM [facility]:  Peripheral Neuropathy Cream - Bupivacaine 1%, Doxepin 3%, Gabapentin 6%, Pentoxifylline 3%, Topiramate 1%, apply 1-2 grams to affected areas 3-4 times daily prn. Patient taking differently: Apply 1-2 application topically See admin instructions. [facility]:  Peripheral Neuropathy Cream - Bupivacaine 1%, Doxepin 3%, Gabapentin 6%, Pentoxifylline 3%, Topiramate 1%, apply 1-2 grams to affected areas 3-4  times daily prn. 03/09/20  Yes Ngetich, Dinah C, NP  Omega-3 Fatty Acids (FISH OIL) 1200 MG CPDR Take 1,200 mg by mouth daily. Take one tablet by mouth once daily    Yes [provider]  pantoprazole (PROTONIX) 40 MG tablet Take 1 tablet (40 mg total) by mouth 2 (two) times daily before a meal. 08/19/19  Yes Reed, Tiffany L, DO  PREDNISOLONE ACETATE OP Place 1 drop into the right eye 3 (three) times daily.   Yes [provider]  risperiDONE (RISPERDAL) 0.5 MG tablet TAKE 1 TABLET BY MOUTH TWICE A DAY Patient taking differently: Take 0.5 mg by mouth 2 (two) times daily.  10/25/19  Yes Reed, Tiffany L, DO  travoprost, benzalkonium, (TRAVATAN) 0.004 % ophthalmic solution Place 1 drop into the left eye at bedtime.   Yes [provider]  TROPICAMIDE OP Place 1 drop into the right eye daily.   Yes [provider]  vitamin C (ASCORBIC ACID) 500 MG tablet Take 500 mg by mouth daily.   Yes [provider]  amoxicillin (AMOXIL) 500 MG capsule Take 1 capsule (500 mg total) by mouth 3 (three) times daily. 04/21/20   Noemi Chapel, MD  gabapentin  (NEURONTIN) 100 MG capsule Take 1 capsule (100 mg total) by mouth at bedtime. Patient not taking: Reported on 04/21/2020 03/09/20   Ngetich, Dinah C, NP  loratadine (CLARITIN) 5 MG chewable tablet Chew 1 tablet (5 mg total) by mouth daily. Patient not taking: Reported on 04/21/2020 03/10/18   Gildardo Cranker, DO    Allergies    Aspirin, Codeine, Iodine, Prednisone, Lyrica [pregabalin], Benzonatate, and Sulfa antibiotics  Review of Systems   Review of Systems  All other systems reviewed and are negative.   Physical Exam Updated Vital Signs BP (!) 159/83   Pulse 70   Temp 99.2 F (37.3 C) (Oral)   Resp (!) 22   SpO2 100%   Physical Exam Vitals and nursing note reviewed.  Constitutional:      General: She is not in acute distress.    Appearance: She is well-developed.  HENT:     Head: Normocephalic and atraumatic.     Mouth/Throat:     Pharynx: No oropharyngeal exudate.     Comments: The patient is able to open her mouth, there is no trismus or torticollis, she does have mild swelling of the left lower jaw but no tenderness redness or lymphadenopathy of the neck around that area.  Internally the dental extraction site appears clean without purulent discharge or swelling of the gingiva Eyes:     General: No scleral icterus.       Right eye: No discharge.        Left eye: No discharge.     Conjunctiva/sclera: Conjunctivae normal.     Comments: Blindness in the right eye, near blindness in the left eye, no periorbital swelling or edema  Neck:     Thyroid: No thyromegaly.     Vascular: No JVD.  Cardiovascular:     Rate and Rhythm: Normal rate and regular rhythm.     Heart sounds: Normal heart sounds. No murmur heard.  No friction rub. No gallop.   Pulmonary:     Effort: Pulmonary effort is normal. No respiratory distress.     Breath sounds: Normal breath sounds. No wheezing or rales.  Abdominal:     General: Bowel sounds are normal. There is no distension.     Palpations: Abdomen  is soft. There is no  mass.     Tenderness: There is no abdominal tenderness.  Musculoskeletal:        General: No tenderness. Normal range of motion.     Cervical back: Normal range of motion and neck supple.     Comments: All 4 extremities appear unremarkable without edema or deformity.  Her compartments are diffusely soft and the joints are diffusely supple.  Lymphadenopathy:     Cervical: No cervical adenopathy.  Skin:    General: Skin is warm and dry.     Findings: No erythema or rash.     Comments: There is no skin breakdown  Neurological:     Mental Status: She is alert.     Coordination: Coordination normal.     Comments: The patient is able to straight leg raise bilaterally with normal strength.  She has normal grips, she is able to raise all 4 extremities with normal strength.  She has no facial droop, no asymmetry, speech is clear  Psychiatric:        Behavior: Behavior normal.     ED Results / Procedures / Treatments   Labs (all labs ordered are listed, but only abnormal results are displayed) Labs Reviewed  BASIC METABOLIC PANEL - Abnormal; Notable for the following components:      Result Value   CO2 19 (*)    Glucose, Bld 150 (*)    GFR calc non Af Amer 54 (*)    All other components within normal limits  CBC - Abnormal; Notable for the following components:   RBC 3.61 (*)    Hemoglobin 11.8 (*)    HCT 34.9 (*)    All other components within normal limits  CBG MONITORING, ED - Abnormal; Notable for the following components:   Glucose-Capillary 117 (*)    All other components within normal limits  URINE CULTURE  URINALYSIS, ROUTINE W REFLEX MICROSCOPIC    EKG EKG Interpretation  Date/Time:  Friday April 21 2020 04:47:22 EDT Ventricular Rate:  80 PR Interval:  234 QRS Duration: 138 QT Interval:  426 QTC Calculation: 491 R Axis:   94 Text Interpretation: Sinus rhythm with 1st degree A-V block Possible Left atrial enlargement Right bundle branch block T  wave abnormality, consider inferolateral ischemia Abnormal ECG since last tracing no significant change Confirmed by Noemi Chapel [phone]) on 04/21/2020 7:36:46 AM   Radiology No results found.  Procedures Procedures (including critical care time)  Medications Ordered in ED Medications  sodium chloride flush (NS) 0.9 % injection 3 mL (3 mLs Intravenous Not Given 04/21/20 0748)    ED Course  I have reviewed the triage vital signs and the nursing notes.  Pertinent labs & imaging results that were available during my care of the patient were reviewed by me and considered in my medical decision making (see chart for details).    MDM Rules/Calculators/A&P                          The patient's EKG is unchanged, I do not think that her symptoms are cardiac related but more likely related to the medication that she took last night.  With her age and taking opiates being opiate nave it is not surprising.  She has a history of side effects to codeine thus not surprising that she has side effects to this opiate.  We will obtain a urinalysis, I see no signs of focal deficits to suggest the need for stroke work-up.  She  is feeling back to normal at this time.  Awaiting family arrival at the bedside  Care discussed with the Son at 8:30 AM - states that after taking oxycodone last night she tried to go to the bathroom and was weak and couldn't walk without assistance - he couldn't get her walker to her in time and she had be lowered to the ground - he had trouble getting her up and called for assistance.    Labs normal -= no obvious concerns, awaiting UA - anticipate d/c - no signs of focal defectis - she is currently drinking water and not nauseated or feeling weak.  UA - negative, Stable for d/c   Final Clinical Impression(s) / ED Diagnoses Final diagnoses:  Weakness    Rx / DC Orders ED Discharge Orders         Ordered    amoxicillin (AMOXIL) 500 MG capsule  3 times daily      Discontinue  Reprint     04/21/20 1117           Noemi Chapel, MD 04/21/20 1119

## 2020-04-21 NOTE — ED Notes (Signed)
Attempted to call son, no answer.  

## 2020-04-21 NOTE — ED Notes (Signed)
Spoke with Katharine Look, went over d/c papers and info. A family friend picked up pt, while this RN was working in another pt's room.

## 2020-04-21 NOTE — ED Notes (Signed)
Attempted to reach pt son to let him know pt is being discharged. No answer and unable to leave a message.

## 2020-04-21 NOTE — ED Notes (Signed)
Continuing to try and reach son via phone, text and his sister is also trying.

## 2020-04-21 NOTE — ED Triage Notes (Addendum)
Pt arrives via [facility] from home where family called out for weakness. Family states patient went to the restroom to urinate and upon attempting to walk back to bed, pt became very unsteady on her feet and her family member lowered her to the ground. No LOC. Upon ems arriving and attempting to get patient to the truck, pt became diaphoretic and pale and c/o weakness in bilateral legs. Pt also Had dental surgery yesterday which she has been taking oxycodone for. A/ox4, denies cp,sob, nvd. Ems stroke screen negative. BP elevated into the 200s initially, down to 160/80 after placing pt on stretcher, cbg 143. hx of HTN. Speech clear, moves all limbs equally. Hx of visual impairment.

## 2020-04-21 NOTE — Telephone Encounter (Signed)
Transition Care Management Follow-Up Telephone Call   Date discharged and where: 04/21/20 [place]  How have you been since you were released from the hospital? better  Any patient concerns? Weakness in legs  Items Reviewed:   Meds: Yes  Allergies:Yes  Dietary Changes Reviewed:Yes  Functional Questionnaire:  Independent-I Dependent-D  ADLs:I with assistance   Dressing- I with assistance    Eating-I   Maintaining continence-I   Transferring-I with assistance   Transportation-D   Meal Prep-D   Managing Meds- D  Confirmed importance and Date/Time of follow-up visits scheduled:04/28/20 with Dinah   Confirmed with patient if condition worsens to call PCP or go to the Emergency Dept. Patient was given office number and encouraged to call back with questions or concerns: yes

## 2020-04-22 LAB — URINE CULTURE: Culture: <10000 — AB

## 2020-04-23 ENCOUNTER — Telehealth: Payer: Self-pay | Admitting: Internal Medicine

## 2020-04-23 NOTE — Telephone Encounter (Signed)
Please notify pt's daughter or son:  urine culture from the hospital returned negative for urinary tract infection.  Only a few bacteria present and initial urinalysis from ED was negative.  If antibiotics were prescribed, she does not need to take them for this.  I saw amoxicillin, but this may have been for her dental work?    I suspect her weakness was due to taking oxycodone which she is not used to having in her 86 yo system.    I see she has a TOC visit on 7/23 with Dinah.

## 2020-04-24 ENCOUNTER — Telehealth: Payer: Self-pay

## 2020-04-24 NOTE — Telephone Encounter (Signed)
I gave lab results to [name], however he stated that [name] is weak and can not even go to the bathroom. She will not be able to come to her [facility] appointment on 04/28/20 Dinah. Please advise?

## 2020-04-24 NOTE — Telephone Encounter (Signed)
[name] [name], daughter called and stated that [name] appointment on Friday they had to cancel because patient can't walk and cannot come to appointment.  She is wondering if it is ok to do a face to face over the telephone. Stated that her brother has a smart phone or do you prefer her to get alittle stronger first and then come into the office.  Please Advise.

## 2020-04-24 NOTE — Telephone Encounter (Signed)
We can do a video call but has to be through [employer].please sign up for Mychart.

## 2020-04-25 NOTE — Telephone Encounter (Signed)
Spoke with [name] [name] and she stated that she will get the myChart set up and call back for an appointment.

## 2020-04-28 ENCOUNTER — Encounter: Payer: Self-pay | Admitting: Family

## 2020-05-02 ENCOUNTER — Other Ambulatory Visit: Payer: Self-pay

## 2020-05-02 ENCOUNTER — Encounter: Payer: Self-pay | Admitting: Family

## 2020-05-02 ENCOUNTER — Telehealth (INDEPENDENT_AMBULATORY_CARE_PROVIDER_SITE_OTHER): Payer: Medicare Other | Admitting: Family

## 2020-05-02 DIAGNOSIS — M1612 Unilateral primary osteoarthritis, left hip: Secondary | ICD-10-CM

## 2020-05-02 DIAGNOSIS — E785 Hyperlipidemia, unspecified: Secondary | ICD-10-CM

## 2020-05-02 DIAGNOSIS — K219 Gastro-esophageal reflux disease without esophagitis: Secondary | ICD-10-CM | POA: Diagnosis not present

## 2020-05-02 DIAGNOSIS — J4520 Mild intermittent asthma, uncomplicated: Secondary | ICD-10-CM

## 2020-05-02 DIAGNOSIS — R29898 Other symptoms and signs involving the musculoskeletal system: Secondary | ICD-10-CM | POA: Diagnosis not present

## 2020-05-02 DIAGNOSIS — Z8679 Personal history of other diseases of the circulatory system: Secondary | ICD-10-CM | POA: Diagnosis not present

## 2020-05-02 DIAGNOSIS — G629 Polyneuropathy, unspecified: Secondary | ICD-10-CM

## 2020-05-02 DIAGNOSIS — I10 Essential (primary) hypertension: Secondary | ICD-10-CM

## 2020-05-02 DIAGNOSIS — R2681 Unsteadiness on feet: Secondary | ICD-10-CM

## 2020-05-02 NOTE — Patient Instructions (Signed)
-   Referral for Home health for Physical and occupation therapy ordered agency will call yu to make an appointment. - Standard Wheelchair ordered today for your to  pickup script and give to physical /Occupational therapy or take to Medical supply to order wheelchair.   - Please schedule appointment for fasting lab work prior to your next appointment with Dr.Reed 06/05/2020.

## 2020-05-02 NOTE — Progress Notes (Signed)
°  °This service is provided via telemedicine ° °No vital signs collected/recorded due to the encounter was a telemedicine visit.  ° °Location of patient (ex: home, work): Home. ° °Patient consents to a telephone visit: Yes. ° °Location of the provider (ex: office, home):  [facility].  ° °Name of any referring provider: N/A ° °Names of all persons participating in the telemedicine service and their role in the encounter:  Patient, Paul Dast[place] Son, Jasmine Dillard, RMA, , , NP.   ° °Time spent on call: 8 minutes spent on the phone with Medical Assistant.  ° ° °Provider:   FNP-C ° °Reed, Tiffany L, DO ° °Patient Care Team: °Reed, Tiffany L, DO as PCP - General (Geriatric Medicine) °Turner, Traci R, MD as PCP - Cardiology (Cardiology) °Wagoner, Matthew R, DPM as Consulting Physician (Podiatry) °Bond, Jeffrey Brent, MD as Referring Physician (Ophthalmology) ° °Extended Emergency Contact Information °Primary Emergency Contact: Binney,Carl °         [place] [address] [place] °Home Phone: [phone] °Mobile Phone: [phone] °Relation: Son °Secondary Emergency Contact: Headen,Sandra ° [place] °Home Phone: [phone] °Mobile Phone: [phone] °Relation: Daughter ° °Code Status:  DNR °Goals of care: Advanced Directive information °Advanced Directives 05/02/2020  °Does Patient Have a Medical Advance Directive? Yes  °Type of Advance Directive Out of facility DNR (pink MOST or yellow form)  °Does patient want to make changes to medical advance directive? No - Patient declined  °Copy of Healthcare Power of Attorney in Chart? -  °Would patient like information on creating a medical advance directive? -  °Pre-existing out of facility DNR order (yellow form or pink MOST form) -  ° ° ° °Chief Complaint  °Patient presents with  °• Transitions Of Care  °  Transition of Care. Patient was in hospital 04/21/2020 and released the same day.  ° ° °HPI:  °Pt is a 86  y.o. female seen today for an acute visit for evaluation of for follow up ED visit.she is seen today via MyChart video visit with son present helping with video call.She was recently seen in the ED at [facility] 04/21/2020 and released the same day for weakness.she had had a lower wisdom tooth extracted prior to ED visit at the dentist and was given pain medication which she took prior to going to bed.she took another one in the morning.When she tried to walk to the bathroom she felt like her knees were giving out so she called for help and son lowered her to the ground.Her son lives with her.she has had chronic knee giving out  For several years uses [employer] which helps.EMS was called and was taken to ED.she was sweaty and pale when she tried to get to the paramedic stretcher and truck.This improved by the time she was in the ED. She had no other significant acute illness.Vital signs were stable.Hgb 11.8 previous 11.7 urine was negative for UTI.urine culture was showed insignificant growth.Her weakness was thought possible from use of oxycodone for tooth extraction given that she is naive to opiates given her advance age.Also has allergies to Codeine.she was discharge home with son. °She states feels much better but unable to use her walker to the bathroom.Son states has to transfer her from bed to bedside commode.He request Physical therapy to for exercise and gait stability. Also request order for a wheelchair with Cushion, anti tippers, extended brake handles, removable elevating leg rests  to enable her to maintain current level of   of independence which cannot be achieved with walker or cane.   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   Edema extremities    GERD (gastroesophageal reflux disease)    Glaucoma    Macular degeneration    Per patient    Osteoarthritis    PVD (peripheral vascular  disease) ([place])    Seasonal allergies    Stroke [facility])    Past Surgical History:  Procedure Laterality Date   ABDOMINAL HYSTERECTOMY  1981   TONSILLECTOMY  1953    Allergies  Allergen Reactions   Aspirin Other (See Comments)    sweats   Codeine Other (See Comments)   Iodine Other (See Comments)    Pt is not aware of this allergy, does not recall much info.   Prednisone Other (See Comments)    sweats   Lyrica [Pregabalin]     [place], "more than sleepy"   Benzonatate Itching and Rash   Sulfa Antibiotics Other (See Comments)    Doesn't remember    Outpatient Encounter Medications as of 05/02/2020  Medication Sig   acetaminophen (TYLENOL) 500 MG tablet Take 500 mg by mouth every 6 (six) hours as needed for mild pain or moderate pain.    albuterol (PROVENTIL HFA;VENTOLIN HFA) 108 (90 Base) MCG/ACT inhaler Inhale 2 puffs into the lungs every 6 (six) hours as needed for wheezing.   alum hydroxide-mag trisilicate (GAVISCON) 37-90 MG CHEW chewable tablet Chew 2 tablets by mouth 2 (two) times daily.    amLODipine (NORVASC) 5 MG tablet Take 1 tablet (5 mg total) by mouth daily.   amoxicillin (AMOXIL) 500 MG capsule Take 1 capsule (500 mg total) by mouth 3 (three) times daily.   atorvastatin (LIPITOR) 10 MG tablet TAKE 1 TABLET BY MOUTH EVERY DAY   atropine 1 % ophthalmic solution Place 1 drop into the right eye daily.    B Complex Vitamins (B COMPLEX PO) Take 1 tablet by mouth daily.   Brinzolamide-Brimonidine (SIMBRINZA) 1-0.2 % SUSP Apply 1 drop to eye 3 (three) times daily.   budesonide-formoterol (SYMBICORT) 80-4.5 MCG/ACT inhaler Inhale 2 puffs into the lungs 2 (two) times daily.   Cholecalciferol (VITAMIN D3) 2000 units TABS Take 2,000 Units by mouth daily.   clopidogrel (PLAVIX) 75 MG tablet TAKE 1 TABLET BY MOUTH EVERY DAY   Coenzyme Q10 (COQ10) 50 MG CAPS Take 50 mg by mouth daily.   docusate sodium (COLACE) 100 MG capsule Take 1 capsule (100 mg total) by  mouth 2 (two) times daily.   fluticasone (FLONASE) 50 MCG/ACT nasal spray Place 1 spray into the nose daily.   hydrocortisone (ANUSOL-HC) 2.5 % rectal cream Place 1 application rectally daily as needed for hemorrhoids or anal itching.   Loratadine (CLARITIN PO) Take 5 mg by mouth as needed.   Menthol, Topical Analgesic, (BIOFREEZE) 4 % GEL Apply 1 application topically 3 (three) times daily. Apply to knee   Multiple Vitamins-Minerals (PRESERVISION AREDS 2) CAPS Take 1 capsule by mouth 2 (two) times daily.   Multiple Vitamins-Minerals (SENIOR MULTIVITAMIN PLUS) TABS Take 1 tablet by mouth daily.    NONFORMULARY OR COMPOUNDED ITEM 1 application 2 (two) times daily as needed. [facility]:  Peripheral Neuropathy Cream - Bupivacaine 1%, Doxepin 3%, Gabapentin 6%, Pentoxifylline 3%, Topiramate 1%, apply 1-2 grams to affected areas 3-4 times daily prn.   Omega-3 Fatty Acids (FISH OIL) 1200 MG CPDR Take  1,200 mg by mouth daily. Take one tablet by mouth once daily    pantoprazole (PROTONIX) 40 MG tablet Take 1 tablet (40 mg total) by mouth 2 (two) times daily before a meal.   PREDNISOLONE ACETATE OP Place 1 drop into the right eye 3 (three) times daily.   risperiDONE (RISPERDAL) 0.5 MG tablet TAKE 1 TABLET BY MOUTH TWICE A DAY   travoprost, benzalkonium, (TRAVATAN) 0.004 % ophthalmic solution Place 1 drop into the left eye at bedtime.   TROPICAMIDE OP Place 1 drop into the right eye daily.   vitamin C (ASCORBIC ACID) 500 MG tablet Take 500 mg by mouth daily.   [DISCONTINUED] gabapentin (NEURONTIN) 100 MG capsule Take 1 capsule (100 mg total) by mouth at bedtime. (Patient not taking: Reported on 04/21/2020)   [DISCONTINUED] loratadine (CLARITIN) 5 MG chewable tablet Chew 1 tablet (5 mg total) by mouth daily. (Patient not taking: Reported on 04/21/2020)   [DISCONTINUED] NONFORMULARY OR COMPOUNDED ITEM [facility]:  Peripheral Neuropathy Cream - Bupivacaine 1%, Doxepin 3%,  Gabapentin 6%, Pentoxifylline 3%, Topiramate 1%, apply 1-2 grams to affected areas 3-4 times daily prn. (Patient taking differently: Apply 1-2 application topically See admin instructions. [facility]:  Peripheral Neuropathy Cream - Bupivacaine 1%, Doxepin 3%, Gabapentin 6%, Pentoxifylline 3%, Topiramate 1%, apply 1-2 grams to affected areas 3-4 times daily prn.)   No facility-administered encounter medications on file as of 05/02/2020.    Review of Systems  Constitutional: Negative for appetite change, chills, fatigue and fever.  HENT: Negative for rhinorrhea, sinus pressure, sinus pain, sneezing and sore throat.        Flonase and loratadine effect  Eyes: Negative for discharge, redness and itching.  Respiratory: Negative for cough, chest tightness, shortness of breath and wheezing.   Cardiovascular: Negative for chest pain, palpitations and leg swelling.  Gastrointestinal: Negative for abdominal distention, abdominal pain, constipation, diarrhea, nausea and vomiting.  Endocrine: Negative for cold intolerance, heat intolerance, polydipsia, polyphagia and polyuria.  Genitourinary: Negative for difficulty urinating, dysuria, flank pain, frequency and urgency.  Musculoskeletal: Positive for arthralgias and gait problem. Negative for joint swelling and myalgias.  Skin: Negative for color change, pallor and rash.  Neurological: Positive for weakness. Negative for dizziness, syncope, speech difficulty, light-headedness, numbness and headaches.  Hematological: Does not bruise/bleed easily.  Psychiatric/Behavioral: Negative for agitation, confusion and sleep disturbance. The patient is not nervous/anxious.     Immunization History  Administered Date(s) Administered   Fluad Quad(high Dose 65+) 06/04/2019   Influenza, High Dose Seasonal PF 07/16/2017, 07/20/2018   Influenza,inj,Quad PF,6+ Mos 06/11/2013, 06/30/2014, 07/17/2015, 06/27/2016   Influenza-Unspecified 07/07/2012   PFIZER  SARS-COV-2 Vaccination 11/20/2019, 12/13/2019   Pneumococcal Conjugate-13 11/07/2014   Pneumococcal Polysaccharide-23 10/08/2007   Zoster Recombinat (Shingrix) 07/31/2017, 12/09/2017   Pertinent  Health Maintenance Due  Topic Date Due   MAMMOGRAM  11/19/2019   INFLUENZA VACCINE  05/07/2020   DEXA SCAN  Completed   PNA vac Low Risk Adult  Completed   Fall Risk  05/02/2020 02/03/2020 09/27/2019 08/23/2019 08/19/2019  Falls in the past year? 1 0 0 0 0  Number falls in past yr: 1 0 0 0 -  Injury with Fall? 0 - 0 0 -   There were no vitals filed for this visit. There is no height or weight on file to calculate BMI. Physical Exam Constitutional:      General: She is not in acute distress.    Appearance: She is not ill-appearing.  HENT:  Head: Normocephalic.     Mouth/Throat:     Mouth: Mucous membranes are moist.  Pulmonary:     Effort: Pulmonary effort is normal. No respiratory distress.  Musculoskeletal:     Right lower leg: No edema.     Left lower leg: No edema.     Comments: Unsteady gait  Neurological:     Mental Status: She is alert and oriented to person, place, and time.     Motor: Weakness present.     Gait: Gait abnormal.  Psychiatric:        Mood and Affect: Mood normal.        Behavior: Behavior normal.        Thought Content: Thought content normal.        Judgment: Judgment normal.    Labs reviewed: Recent Labs    08/19/19 1427 04/21/20 0458  NA 141 139  K 4.2 3.6  CL 107 109  CO2 21 19*  GLUCOSE 102* 150*  BUN 13 9  CREATININE 0.91* 0.91  CALCIUM 9.9 9.2   Recent Labs    08/19/19 1427  AST 27  ALT 22  BILITOT 1.1  PROT 7.4   Recent Labs    04/21/20 0458  WBC 10.2  HGB 11.8*  HCT 34.9*  MCV 96.7  PLT 194   Lab Results  Component Value Date   TSH 2.510 06/09/2014   Lab Results  Component Value Date   HGBA1C 5.6 04/22/2016   Lab Results  Component Value Date   CHOL 155 08/19/2019   HDL 87 08/19/2019   LDLCALC 55  08/19/2019   TRIG 47 08/19/2019   CHOLHDL 1.8 08/19/2019    Significant Diagnostic Results in last 30 days:  No results found.  Assessment/Plan 1. Unsteady gait Requires x 1 assist to transfer from bed to bedside commode.she was walking with her walker prior to recent ED visit 04/21/2020.  - Ambulatory referral to [facility] for Physical and occupational Therapy for gait stability,exercise,ROM and muscle strengthening.Also evaluate for appropriate wheelchair size and training of use of wheelchair.   - For home use only DME Other see comment : she requires a standard wheelchair with Cushion, anti tippers, extended brake handles, removable elevating leg rests  to enable her to maintain current level of independence which cannot be achieved with walker or cane.  - Wheelchair ordered today script given to [facility] front desk staff for patient's son [name] to pickup and take to Physical Therapy and medical supply. Son notified to pick up script for wheelchair from [facility] office.   2. Weakness of both lower extremities Unclear etiology status post ED visit thought due to taking oxycodone after her tooth extraction by the Dentist.  - CBC with Differential/Platelet; Future - CMP with eGFR([facility]); Future  - Ambulatory referral to Home Health physical Therapy as above.  - For home use only DME Other see comment: standard wheelchair  3. Primary osteoarthritis of left hip - continue on BioFreeze and Tylenol as needed for pain - Ambulatory referral to [facility] for PT/OT  - For home use only DME Other see comment: Standard wheelchair   4. Peripheral polyneuropathy Continue on Tylenol - Ambulatory referral to [facility] and occupational therapy as above.  5. Essential hypertension No home B/P readings for evaluation.continue on Amlodipine 5 mg tablet daily.  - CBC with Differential/Platelet; Future - CMP with eGFR([facility]); Future - TSH; Future  6. Hyperlipidemia, unspecified  hyperlipidemia type Latest LDL at goal.will recheck lipid panel  to upcoming appointment 06/05/2020 with Dr.Reed then adjust Lipitor dose given her advance age. °- Lipid panel; Future ° °7. Gastroesophageal reflux disease without esophagitis °Symptoms controlled on Protonix 40 mg tablet. ° °8. Mild intermittent asthma without complication °symptoms controlled.continue on symbicort  ° °9. History of cerebrovascular disease °Continue on Plavix and Lipitor.  °- Ambulatory referral to Home Health PT/OT as above. °- For home use only DME Other see comment: Standard wheelchair as above ° °Family/ staff Communication: Reviewed plan of care with patient and Son Carl Rodarte present during visit verbalized understanding.  ° °Labs/tests ordered:  °- CBC with Differential/Platelet; Future °- CMP with eGFR([facility]); Future °- TSH; Future °- Lipid panel; Future ° °Next Appointment: Has upcoming appointment with Dr.Reed 06/05/2020.Fasting lab work 2-4 days prior to visit.  ° °I connected with  Sakia M Mcmeans on 05/02/20 by a video enabled telemedicine application and verified that I am speaking with the correct person using two identifiers. °  °I discussed the limitations of evaluation and management by telemedicine. The patient expressed understanding and agreed to proceed. ° °Spent 25 minutes of face to face on MyChart Video with patient and Son Carl Brazell.  ° ° C , NP  °

## 2020-05-04 ENCOUNTER — Telehealth: Payer: Self-pay

## 2020-05-04 NOTE — Telephone Encounter (Signed)
[name], medders are scheduled for a virtual visit with your provider today.    Just as we do with appointments in the office, we must obtain your consent to participate.  Your consent will be active for this visit and any virtual visit you may have with one of our providers in the next 365 days.    If you have a MyChart account, I can also send a copy of this consent to you electronically.  All virtual visits are billed to your insurance company just like a traditional visit in the office.  As this is a virtual visit, video technology does not allow for your provider to perform a traditional examination.  This may limit your provider's ability to fully assess your condition.  If your provider identifies any concerns that need to be evaluated in person or the need to arrange testing such as labs, EKG, etc, we will make arrangements to do so.    Although advances in technology are sophisticated, we cannot ensure that it will always work on either your end or our end.  If the connection with a video visit is poor, we may have to switch to a telephone visit.  With either a video or telephone visit, we are not always able to ensure that we have a secure connection.   I need to obtain your verbal consent now.   Are you willing to proceed with your visit today?   [name] has provided verbal consent on 05/02/2020 for a virtual visit (video or telephone).   [place], [place] 05/02/2020  11:39 AM

## 2020-05-04 NOTE — Progress Notes (Deleted)
. {Choose 1 Note Type (Video or Telephone):[phone]}    Date:  05/04/2020   ID:  [name] [name], DOB [birth year], MRN [id number] The patient was identified using 2 identifiers.  {Patient Location:[phone]::"Home"} {Provider Location:[phone]::"Home Office"}  PCP:  Gayland Curry, DO  Cardiologist:  Fransico Him, MD *** Electrophysiologist:  None   Evaluation Performed:  {Choose Visit [phone]::"Follow-Up Visit"}  Chief Complaint:  ***  History of Present Illness:    [name] [name] is a 86 y.o. female with history of hypertension, bradycardia, chronic lower extremity edema and asthma.   Patient was seen in our office 11/2018 for atypical chest pain felt to be musculoskeletal.  Patient was given reassurance.  BP Was elevated and they discussed possibly increasing amlodipine but the patient and daughter wanted to continue to measure at home.   I saw the patient 04/05/20 with atypical chest pain that was felt to be Musculoskeletal. BP on high side but ok at home. No changes made.  Patient went to the ED with weakness 04/21/20 after taking a pain med for wisdom tooth extraction. Labs and EKG stable.  The patient {does/does not:200015} have symptoms concerning for COVID-19 infection (fever, chills, cough, or new shortness of breath).    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   Edema extremities    GERD (gastroesophageal reflux disease)    Glaucoma    Macular degeneration    Per patient    Osteoarthritis    PVD (peripheral vascular disease) ([place])    Seasonal allergies    Stroke [facility])    Past Surgical History:  Procedure Laterality Date   [place]     No outpatient medications have been marked as taking for the 05/09/20 encounter (Appointment) with [name] Burn, PA-C.     Allergies:   Aspirin, Codeine, Iodine,  Prednisone, Lyrica [pregabalin], Benzonatate, and Sulfa antibiotics   Social History   Tobacco Use   Smoking status: Never Smoker   Smokeless tobacco: Never Used  Vaping Use   Vaping Use: Never used  Substance Use Topics   Alcohol use: No    Alcohol/week: 0.0 standard drinks   Drug use: No     Family Hx: The patient's family history includes Diabetes in her mother; Heart disease in her mother; Hypertension in her father and mother; Stroke in her father.  ROS:   Please see the history of present illness.    *** All other systems reviewed and are negative.   Prior CV studies:   The following studies were reviewed today:  Echo 07/30/18 Study Conclusions   - Left ventricle: The cavity size was normal. Wall thickness was    normal. Systolic function was normal. The estimated ejection    fraction was in the range of 60% to 65%. Wall motion was normal;    there were no regional wall motion abnormalities.  - Pulmonic valve: There was moderate regurgitation.  - Pulmonary arteries: Systolic pressure was mildly increased. PA    peak pressure: 32 mm Hg (S).   Impressions:   - No cardiac source of emboli was indentified. Compared to the    prior study, there has been no significant interval change.        Labs/Other Tests and Data Reviewed:    EKG:  {EKG/Telemetry Strips Reviewed:[phone]}  Recent Labs: 08/19/2019: ALT 22 04/21/2020: BUN 9;  Creatinine, Ser 0.91; Hemoglobin 11.8; Platelets 194; Potassium 3.6; Sodium 139   Recent Lipid Panel Lab Results  Component Value Date/Time   CHOL 155 08/19/2019 02:27 PM   CHOL 187 08/17/2013 08:36 AM   TRIG 47 08/19/2019 02:27 PM   HDL 87 08/19/2019 02:27 PM   HDL 105 08/17/2013 08:36 AM   CHOLHDL 1.8 08/19/2019 02:27 PM   LDLCALC 55 08/19/2019 02:27 PM    Wt Readings from Last 3 Encounters:  04/05/20 138 lb 3.2 oz (62.7 kg)  03/09/20 141 lb 12.8 oz (64.3 kg)  02/03/20 137 lb 12.8 oz (62.5 kg)     Objective:     Vital Signs:  There were no vitals taken for this visit.   {[facility] Virtual Exam (Optional):[phone]::"VITAL SIGNS:  reviewed"}  ASSESSMENT & PLAN:    Hypertension blood pressure little on the high side but usually 140 at home but given her age would not increase amlodipine   Bradycardia heart rate 78 today with right bundle branch block block.  Asymptomatic   RBBB chronic   Chest pain atypical and appears to be musculoskeletal.  Tender to touch and jumps around.  No further work-up.   Moderate MR echo in 2019  Asymptomatic.       COVID-19 Education: The signs and symptoms of COVID-19 were discussed with the patient and how to seek care for testing (follow up with PCP or arrange E-visit).  ***The importance of social distancing was discussed today.  Time:   Today, I have spent *** minutes with the patient with telehealth technology discussing the above problems.     Medication Adjustments/Labs and Tests Ordered: Current medicines are reviewed at length with the patient today.  Concerns regarding medicines are outlined above.   Tests Ordered: No orders of the defined types were placed in this encounter.   Medication Changes: No orders of the defined types were placed in this encounter.   Follow Up:  {F/U Format:[phone]} {follow up:15908}  Signed, Ermalinda Barrios, PA-C  05/04/2020 10:24 AM    [facility]

## 2020-05-05 ENCOUNTER — Telehealth: Payer: Self-pay | Admitting: *Deleted

## 2020-05-05 DIAGNOSIS — Z8673 Personal history of transient ischemic attack (TIA), and cerebral infarction without residual deficits: Secondary | ICD-10-CM | POA: Diagnosis not present

## 2020-05-05 DIAGNOSIS — K219 Gastro-esophageal reflux disease without esophagitis: Secondary | ICD-10-CM | POA: Diagnosis not present

## 2020-05-05 DIAGNOSIS — J4520 Mild intermittent asthma, uncomplicated: Secondary | ICD-10-CM | POA: Diagnosis not present

## 2020-05-05 DIAGNOSIS — T402X5D Adverse effect of other opioids, subsequent encounter: Secondary | ICD-10-CM | POA: Diagnosis not present

## 2020-05-05 DIAGNOSIS — H3530 Unspecified macular degeneration: Secondary | ICD-10-CM | POA: Diagnosis not present

## 2020-05-05 DIAGNOSIS — R531 Weakness: Secondary | ICD-10-CM | POA: Diagnosis not present

## 2020-05-05 DIAGNOSIS — G629 Polyneuropathy, unspecified: Secondary | ICD-10-CM | POA: Diagnosis not present

## 2020-05-05 DIAGNOSIS — I10 Essential (primary) hypertension: Secondary | ICD-10-CM | POA: Diagnosis not present

## 2020-05-05 DIAGNOSIS — M170 Bilateral primary osteoarthritis of knee: Secondary | ICD-10-CM | POA: Diagnosis not present

## 2020-05-05 DIAGNOSIS — I739 Peripheral vascular disease, unspecified: Secondary | ICD-10-CM | POA: Diagnosis not present

## 2020-05-05 DIAGNOSIS — H409 Unspecified glaucoma: Secondary | ICD-10-CM | POA: Diagnosis not present

## 2020-05-05 DIAGNOSIS — I2510 Atherosclerotic heart disease of native coronary artery without angina pectoris: Secondary | ICD-10-CM | POA: Diagnosis not present

## 2020-05-05 DIAGNOSIS — F419 Anxiety disorder, unspecified: Secondary | ICD-10-CM | POA: Diagnosis not present

## 2020-05-05 DIAGNOSIS — M1612 Unilateral primary osteoarthritis, left hip: Secondary | ICD-10-CM | POA: Diagnosis not present

## 2020-05-05 NOTE — Telephone Encounter (Signed)
[name] [name] with [facility] called requesting verbal orders for PT 2x3wks and 1x4wks.  Verbal orders given.

## 2020-05-09 ENCOUNTER — Telehealth: Payer: Medicare Other | Admitting: Physician Assistant

## 2020-05-09 ENCOUNTER — Ambulatory Visit: Payer: Medicare Other | Admitting: Podiatry

## 2020-05-09 DIAGNOSIS — G629 Polyneuropathy, unspecified: Secondary | ICD-10-CM | POA: Diagnosis not present

## 2020-05-09 DIAGNOSIS — I2510 Atherosclerotic heart disease of native coronary artery without angina pectoris: Secondary | ICD-10-CM | POA: Diagnosis not present

## 2020-05-09 DIAGNOSIS — M1612 Unilateral primary osteoarthritis, left hip: Secondary | ICD-10-CM | POA: Diagnosis not present

## 2020-05-09 DIAGNOSIS — I10 Essential (primary) hypertension: Secondary | ICD-10-CM | POA: Diagnosis not present

## 2020-05-09 DIAGNOSIS — T402X5D Adverse effect of other opioids, subsequent encounter: Secondary | ICD-10-CM | POA: Diagnosis not present

## 2020-05-09 DIAGNOSIS — I739 Peripheral vascular disease, unspecified: Secondary | ICD-10-CM | POA: Diagnosis not present

## 2020-05-11 DIAGNOSIS — I10 Essential (primary) hypertension: Secondary | ICD-10-CM | POA: Diagnosis not present

## 2020-05-11 DIAGNOSIS — I739 Peripheral vascular disease, unspecified: Secondary | ICD-10-CM | POA: Diagnosis not present

## 2020-05-11 DIAGNOSIS — M1612 Unilateral primary osteoarthritis, left hip: Secondary | ICD-10-CM | POA: Diagnosis not present

## 2020-05-11 DIAGNOSIS — G629 Polyneuropathy, unspecified: Secondary | ICD-10-CM | POA: Diagnosis not present

## 2020-05-11 DIAGNOSIS — I2510 Atherosclerotic heart disease of native coronary artery without angina pectoris: Secondary | ICD-10-CM | POA: Diagnosis not present

## 2020-05-11 DIAGNOSIS — T402X5D Adverse effect of other opioids, subsequent encounter: Secondary | ICD-10-CM | POA: Diagnosis not present

## 2020-05-16 DIAGNOSIS — T402X5D Adverse effect of other opioids, subsequent encounter: Secondary | ICD-10-CM | POA: Diagnosis not present

## 2020-05-16 DIAGNOSIS — I739 Peripheral vascular disease, unspecified: Secondary | ICD-10-CM | POA: Diagnosis not present

## 2020-05-16 DIAGNOSIS — I10 Essential (primary) hypertension: Secondary | ICD-10-CM | POA: Diagnosis not present

## 2020-05-16 DIAGNOSIS — I2510 Atherosclerotic heart disease of native coronary artery without angina pectoris: Secondary | ICD-10-CM | POA: Diagnosis not present

## 2020-05-16 DIAGNOSIS — G629 Polyneuropathy, unspecified: Secondary | ICD-10-CM | POA: Diagnosis not present

## 2020-05-16 DIAGNOSIS — M1612 Unilateral primary osteoarthritis, left hip: Secondary | ICD-10-CM | POA: Diagnosis not present

## 2020-05-17 DIAGNOSIS — G629 Polyneuropathy, unspecified: Secondary | ICD-10-CM | POA: Diagnosis not present

## 2020-05-17 DIAGNOSIS — I739 Peripheral vascular disease, unspecified: Secondary | ICD-10-CM | POA: Diagnosis not present

## 2020-05-17 DIAGNOSIS — I10 Essential (primary) hypertension: Secondary | ICD-10-CM | POA: Diagnosis not present

## 2020-05-17 DIAGNOSIS — I2510 Atherosclerotic heart disease of native coronary artery without angina pectoris: Secondary | ICD-10-CM | POA: Diagnosis not present

## 2020-05-17 DIAGNOSIS — M1612 Unilateral primary osteoarthritis, left hip: Secondary | ICD-10-CM | POA: Diagnosis not present

## 2020-05-17 DIAGNOSIS — T402X5D Adverse effect of other opioids, subsequent encounter: Secondary | ICD-10-CM | POA: Diagnosis not present

## 2020-05-18 DIAGNOSIS — I10 Essential (primary) hypertension: Secondary | ICD-10-CM | POA: Diagnosis not present

## 2020-05-18 DIAGNOSIS — G629 Polyneuropathy, unspecified: Secondary | ICD-10-CM | POA: Diagnosis not present

## 2020-05-18 DIAGNOSIS — I739 Peripheral vascular disease, unspecified: Secondary | ICD-10-CM | POA: Diagnosis not present

## 2020-05-18 DIAGNOSIS — M1612 Unilateral primary osteoarthritis, left hip: Secondary | ICD-10-CM | POA: Diagnosis not present

## 2020-05-18 DIAGNOSIS — T402X5D Adverse effect of other opioids, subsequent encounter: Secondary | ICD-10-CM | POA: Diagnosis not present

## 2020-05-18 DIAGNOSIS — I2510 Atherosclerotic heart disease of native coronary artery without angina pectoris: Secondary | ICD-10-CM | POA: Diagnosis not present

## 2020-05-23 DIAGNOSIS — M1612 Unilateral primary osteoarthritis, left hip: Secondary | ICD-10-CM | POA: Diagnosis not present

## 2020-05-23 DIAGNOSIS — T402X5D Adverse effect of other opioids, subsequent encounter: Secondary | ICD-10-CM | POA: Diagnosis not present

## 2020-05-23 DIAGNOSIS — I2510 Atherosclerotic heart disease of native coronary artery without angina pectoris: Secondary | ICD-10-CM | POA: Diagnosis not present

## 2020-05-23 DIAGNOSIS — I739 Peripheral vascular disease, unspecified: Secondary | ICD-10-CM | POA: Diagnosis not present

## 2020-05-23 DIAGNOSIS — I10 Essential (primary) hypertension: Secondary | ICD-10-CM | POA: Diagnosis not present

## 2020-05-23 DIAGNOSIS — G629 Polyneuropathy, unspecified: Secondary | ICD-10-CM | POA: Diagnosis not present

## 2020-05-24 DIAGNOSIS — I10 Essential (primary) hypertension: Secondary | ICD-10-CM | POA: Diagnosis not present

## 2020-05-24 DIAGNOSIS — I739 Peripheral vascular disease, unspecified: Secondary | ICD-10-CM | POA: Diagnosis not present

## 2020-05-24 DIAGNOSIS — G629 Polyneuropathy, unspecified: Secondary | ICD-10-CM | POA: Diagnosis not present

## 2020-05-24 DIAGNOSIS — I2510 Atherosclerotic heart disease of native coronary artery without angina pectoris: Secondary | ICD-10-CM | POA: Diagnosis not present

## 2020-05-24 DIAGNOSIS — M1612 Unilateral primary osteoarthritis, left hip: Secondary | ICD-10-CM | POA: Diagnosis not present

## 2020-05-24 DIAGNOSIS — T402X5D Adverse effect of other opioids, subsequent encounter: Secondary | ICD-10-CM | POA: Diagnosis not present

## 2020-05-25 DIAGNOSIS — I739 Peripheral vascular disease, unspecified: Secondary | ICD-10-CM | POA: Diagnosis not present

## 2020-05-25 DIAGNOSIS — M1612 Unilateral primary osteoarthritis, left hip: Secondary | ICD-10-CM | POA: Diagnosis not present

## 2020-05-25 DIAGNOSIS — G629 Polyneuropathy, unspecified: Secondary | ICD-10-CM | POA: Diagnosis not present

## 2020-05-25 DIAGNOSIS — T402X5D Adverse effect of other opioids, subsequent encounter: Secondary | ICD-10-CM | POA: Diagnosis not present

## 2020-05-25 DIAGNOSIS — I2510 Atherosclerotic heart disease of native coronary artery without angina pectoris: Secondary | ICD-10-CM | POA: Diagnosis not present

## 2020-05-25 DIAGNOSIS — I10 Essential (primary) hypertension: Secondary | ICD-10-CM | POA: Diagnosis not present

## 2020-05-30 ENCOUNTER — Telehealth: Payer: Self-pay | Admitting: *Deleted

## 2020-05-30 NOTE — Telephone Encounter (Signed)
Patient daughter, Katharine Look called and stated that patient is having Hallucinations with taking 2 of the Risperidone. Wants to know if it would be ok for patient to only take One a day or does she need to change to something different.   Please Advise.   Patient has an appointment on 06/05/2020 with you for follow up.

## 2020-05-30 NOTE — Telephone Encounter (Signed)
Katharine Look notified and agreed. Stated that they will discuss at her appointment on 8/30 and she will send a note for [name] to review.

## 2020-05-30 NOTE — Telephone Encounter (Signed)
The risperdal is to help decrease the hallucinations so more should make them less frequent.  We may need to change her med when I see her next week.  If Katharine Look can give you more info, be part of the visit or send me a note about the hallucinations, that would be helpful.

## 2020-05-31 DIAGNOSIS — I10 Essential (primary) hypertension: Secondary | ICD-10-CM | POA: Diagnosis not present

## 2020-05-31 DIAGNOSIS — G629 Polyneuropathy, unspecified: Secondary | ICD-10-CM | POA: Diagnosis not present

## 2020-05-31 DIAGNOSIS — I2510 Atherosclerotic heart disease of native coronary artery without angina pectoris: Secondary | ICD-10-CM | POA: Diagnosis not present

## 2020-05-31 DIAGNOSIS — M1612 Unilateral primary osteoarthritis, left hip: Secondary | ICD-10-CM | POA: Diagnosis not present

## 2020-05-31 DIAGNOSIS — T402X5D Adverse effect of other opioids, subsequent encounter: Secondary | ICD-10-CM | POA: Diagnosis not present

## 2020-05-31 DIAGNOSIS — I739 Peripheral vascular disease, unspecified: Secondary | ICD-10-CM | POA: Diagnosis not present

## 2020-06-01 DIAGNOSIS — T402X5D Adverse effect of other opioids, subsequent encounter: Secondary | ICD-10-CM | POA: Diagnosis not present

## 2020-06-01 DIAGNOSIS — I739 Peripheral vascular disease, unspecified: Secondary | ICD-10-CM | POA: Diagnosis not present

## 2020-06-01 DIAGNOSIS — M1612 Unilateral primary osteoarthritis, left hip: Secondary | ICD-10-CM | POA: Diagnosis not present

## 2020-06-01 DIAGNOSIS — I10 Essential (primary) hypertension: Secondary | ICD-10-CM | POA: Diagnosis not present

## 2020-06-01 DIAGNOSIS — G629 Polyneuropathy, unspecified: Secondary | ICD-10-CM | POA: Diagnosis not present

## 2020-06-01 DIAGNOSIS — I2510 Atherosclerotic heart disease of native coronary artery without angina pectoris: Secondary | ICD-10-CM | POA: Diagnosis not present

## 2020-06-04 DIAGNOSIS — H409 Unspecified glaucoma: Secondary | ICD-10-CM | POA: Diagnosis not present

## 2020-06-04 DIAGNOSIS — R531 Weakness: Secondary | ICD-10-CM | POA: Diagnosis not present

## 2020-06-04 DIAGNOSIS — I2510 Atherosclerotic heart disease of native coronary artery without angina pectoris: Secondary | ICD-10-CM | POA: Diagnosis not present

## 2020-06-04 DIAGNOSIS — K219 Gastro-esophageal reflux disease without esophagitis: Secondary | ICD-10-CM | POA: Diagnosis not present

## 2020-06-04 DIAGNOSIS — G629 Polyneuropathy, unspecified: Secondary | ICD-10-CM | POA: Diagnosis not present

## 2020-06-04 DIAGNOSIS — I739 Peripheral vascular disease, unspecified: Secondary | ICD-10-CM | POA: Diagnosis not present

## 2020-06-04 DIAGNOSIS — T402X5D Adverse effect of other opioids, subsequent encounter: Secondary | ICD-10-CM | POA: Diagnosis not present

## 2020-06-04 DIAGNOSIS — H3530 Unspecified macular degeneration: Secondary | ICD-10-CM | POA: Diagnosis not present

## 2020-06-04 DIAGNOSIS — M170 Bilateral primary osteoarthritis of knee: Secondary | ICD-10-CM | POA: Diagnosis not present

## 2020-06-04 DIAGNOSIS — F419 Anxiety disorder, unspecified: Secondary | ICD-10-CM | POA: Diagnosis not present

## 2020-06-04 DIAGNOSIS — Z8673 Personal history of transient ischemic attack (TIA), and cerebral infarction without residual deficits: Secondary | ICD-10-CM | POA: Diagnosis not present

## 2020-06-04 DIAGNOSIS — M1612 Unilateral primary osteoarthritis, left hip: Secondary | ICD-10-CM | POA: Diagnosis not present

## 2020-06-04 DIAGNOSIS — I10 Essential (primary) hypertension: Secondary | ICD-10-CM | POA: Diagnosis not present

## 2020-06-04 DIAGNOSIS — J4520 Mild intermittent asthma, uncomplicated: Secondary | ICD-10-CM | POA: Diagnosis not present

## 2020-06-05 ENCOUNTER — Ambulatory Visit (INDEPENDENT_AMBULATORY_CARE_PROVIDER_SITE_OTHER): Payer: Medicare Other | Admitting: Internal Medicine

## 2020-06-05 ENCOUNTER — Other Ambulatory Visit: Payer: Self-pay

## 2020-06-05 ENCOUNTER — Encounter: Payer: Self-pay | Admitting: Internal Medicine

## 2020-06-05 VITALS — BP 140/88 | HR 73 | Temp 97.7°F | Ht 62.0 in | Wt 138.0 lb

## 2020-06-05 DIAGNOSIS — G629 Polyneuropathy, unspecified: Secondary | ICD-10-CM | POA: Diagnosis not present

## 2020-06-05 DIAGNOSIS — H3530 Unspecified macular degeneration: Secondary | ICD-10-CM | POA: Diagnosis not present

## 2020-06-05 DIAGNOSIS — Z8679 Personal history of other diseases of the circulatory system: Secondary | ICD-10-CM | POA: Diagnosis not present

## 2020-06-05 DIAGNOSIS — F0391 Unspecified dementia with behavioral disturbance: Secondary | ICD-10-CM

## 2020-06-05 DIAGNOSIS — H401113 Primary open-angle glaucoma, right eye, severe stage: Secondary | ICD-10-CM

## 2020-06-05 DIAGNOSIS — Z9181 History of falling: Secondary | ICD-10-CM

## 2020-06-05 DIAGNOSIS — R29898 Other symptoms and signs involving the musculoskeletal system: Secondary | ICD-10-CM | POA: Diagnosis not present

## 2020-06-05 DIAGNOSIS — F0392 Unspecified dementia, unspecified severity, with psychotic disturbance: Secondary | ICD-10-CM

## 2020-06-05 MED ORDER — RISPERIDONE 0.5 MG PO TABS: 0.5000 mg | ORAL_TABLET | Freq: Every day | ORAL | 0 refills | Status: DC

## 2020-06-05 NOTE — Patient Instructions (Signed)
Reduce risperdal to one tablet in the morning.   Keep me posted about hallucinations, dizziness and lightheadedness.  I think this is multifactorial:  Poor vision, progressive cognitive loss, and advancing age.  Blood pressure is borderline high.

## 2020-06-05 NOTE — Progress Notes (Signed)
Location:  [facility] clinic Provider:  Anette Barra [name], D.O., C.M.D.  Code Status: she and son did not bring up deactivating DNR today with me  Goals of Care:  Advanced Directives 06/05/2020  Does Patient Have a Medical Advance Directive? Yes  Type of Advance Directive Out of facility DNR (pink MOST or yellow form)  Does patient want to make changes to medical advance directive? No - Patient declined  Copy of [place] in Chart? -  Would patient like information on creating a medical advance directive? -  Pre-existing out of facility DNR order (yellow form or pink MOST form) -   Chief Complaint  Patient presents with  . Medical Management of Chronic Issues    4 month follow up   . Health Maintenance    Influenza high dose is out of stock, tetanus and mammogram   . Acute Visit    Wants to deactivate her DNR , hallicinations  and light headedness due to risperdal    HPI: Patient is a 86 y.o. female seen today for medical management of chronic diseases.    She is having more dizziness and hallucinations--her daughter and son have noted it actually happens more (rather than less as expected) after her second risperdal pill.  She is still seeing things that aren't there--sees objects and people.  Not scary.  It bothers her because does not want it to happen.    She was given  Hydrocodone from the dentist b/c she had a wisdom tooth pulled--her son only gave her 1/2.  She had to go to the bathroom and she fell.  She then got up at night to go to the bathroom, she fell in the dark.  She could not walk or stand since then.  Some days she can still use her walker, but some days needs to use the wheelchair.  One time, her son could not get her up.    She is getting therapy at home.    Vision is also poor.    She will wake up really disoriented at times.  She will be very weak then.  Seems to happen if she takes a nap.  Just happens sometimes.  Discussed sundowning.    Past  Medical History:  Diagnosis Date  . Anxiety   . Asthma   . Benign essential hypertension   . Bradycardia, drug induced 06/11/2016  . Edema extremities   . GERD (gastroesophageal reflux disease)   . Glaucoma   . Macular degeneration    Per patient   . Osteoarthritis   . PVD (peripheral vascular disease) ([place])   . Seasonal allergies   . Stroke [facility])     Past Surgical History:  Procedure Laterality Date  . ABDOMINAL HYSTERECTOMY  1981  . TONSILLECTOMY  1953    Allergies  Allergen Reactions  . Aspirin Other (See Comments)    sweats  . Codeine Other (See Comments)  . Iodine Other (See Comments)    Pt is not aware of this allergy, does not recall much info.  . Prednisone Other (See Comments)    sweats  . Lyrica [Pregabalin]     Wobbly, "more than sleepy"  . Benzonatate Itching and Rash  . Sulfa Antibiotics Other (See Comments)    Doesn't remember    Outpatient Encounter Medications as of 06/05/2020  Medication Sig  . acetaminophen (TYLENOL) 500 MG tablet Take 500 mg by mouth every 6 (six) hours as needed for mild pain or moderate pain.   Marland Kitchen  albuterol (PROVENTIL HFA;VENTOLIN HFA) 108 (90 Base) MCG/ACT inhaler Inhale 2 puffs into the lungs every 6 (six) hours as needed for wheezing.  Marland Kitchen alum hydroxide-mag trisilicate (GAVISCON) 19-14 MG CHEW chewable tablet Chew 2 tablets by mouth 2 (two) times daily.   Marland Kitchen amLODipine (NORVASC) 5 MG tablet Take 1 tablet (5 mg total) by mouth daily.  Marland Kitchen atorvastatin (LIPITOR) 10 MG tablet TAKE 1 TABLET BY MOUTH EVERY DAY  . atropine 1 % ophthalmic solution Place 1 drop into the right eye daily.   . B Complex Vitamins (B COMPLEX PO) Take 1 tablet by mouth daily.  . Brinzolamide-Brimonidine (SIMBRINZA) 1-0.2 % SUSP Apply 1 drop to eye 3 (three) times daily.  . budesonide-formoterol (SYMBICORT) 80-4.5 MCG/ACT inhaler Inhale 2 puffs into the lungs 2 (two) times daily.  . Cholecalciferol (VITAMIN D3) 2000 units TABS Take 2,000 Units by mouth daily.  .  clopidogrel (PLAVIX) 75 MG tablet TAKE 1 TABLET BY MOUTH EVERY DAY  . Coenzyme Q10 (COQ10) 50 MG CAPS Take 50 mg by mouth daily.  Marland Kitchen docusate sodium (COLACE) 100 MG capsule Take 1 capsule (100 mg total) by mouth 2 (two) times daily.  . fluticasone (FLONASE) 50 MCG/ACT nasal spray Place 1 spray into the nose daily.  . hydrocortisone (ANUSOL-HC) 2.5 % rectal cream Place 1 application rectally daily as needed for hemorrhoids or anal itching.  . Loratadine (CLARITIN PO) Take 5 mg by mouth as needed.  . Menthol, Topical Analgesic, (BIOFREEZE) 4 % GEL Apply 1 application topically 3 (three) times daily. Apply to knee  . Multiple Vitamins-Minerals (PRESERVISION AREDS 2) CAPS Take 1 capsule by mouth 2 (two) times daily.  . Multiple Vitamins-Minerals (SENIOR MULTIVITAMIN PLUS) TABS Take 1 tablet by mouth daily.   . NONFORMULARY OR COMPOUNDED ITEM 1 application 2 (two) times daily as needed. [facility]:  Peripheral Neuropathy Cream - Bupivacaine 1%, Doxepin 3%, Gabapentin 6%, Pentoxifylline 3%, Topiramate 1%, apply 1-2 grams to affected areas 3-4 times daily prn.  . Omega-3 Fatty Acids (FISH OIL) 1200 MG CPDR Take 1,200 mg by mouth daily. Take one tablet by mouth once daily   . pantoprazole (PROTONIX) 40 MG tablet Take 1 tablet (40 mg total) by mouth 2 (two) times daily before a meal.  . PREDNISOLONE ACETATE OP Place 1 drop into the right eye 3 (three) times daily.  . risperiDONE (RISPERDAL) 0.5 MG tablet TAKE 1 TABLET BY MOUTH TWICE A DAY  . travoprost, benzalkonium, (TRAVATAN) 0.004 % ophthalmic solution Place 1 drop into the left eye at bedtime.  . TROPICAMIDE OP Place 1 drop into the right eye daily.  . vitamin C (ASCORBIC ACID) 500 MG tablet Take 500 mg by mouth daily.  . [DISCONTINUED] amoxicillin (AMOXIL) 500 MG capsule Take 1 capsule (500 mg total) by mouth 3 (three) times daily.   No facility-administered encounter medications on file as of 06/05/2020.    Review of Systems:  Review of  Systems  Constitutional: Negative for chills and fever.  Eyes: Positive for blurred vision.       Sees only with one eye  Respiratory: Negative for cough and shortness of breath.   Cardiovascular: Negative for chest pain, palpitations and leg swelling.  Gastrointestinal: Negative for abdominal pain, blood in stool, constipation, diarrhea and melena.  Genitourinary: Negative for dysuria.  Musculoskeletal: Positive for falls. Negative for back pain and joint pain.  Skin: Negative for itching and rash.  Neurological: Positive for dizziness and headaches. Negative for loss of consciousness.  Psychiatric/Behavioral: Positive  for hallucinations and memory loss. Negative for depression. The patient is not nervous/anxious and does not have insomnia.     Health Maintenance  Topic Date Due  . TETANUS/TDAP  Never done  . MAMMOGRAM  11/19/2019  . INFLUENZA VACCINE  05/07/2020  . DEXA SCAN  Completed  . COVID-19 Vaccine  Completed  . PNA vac Low Risk Adult  Completed    Physical Exam: Vitals:   06/05/20 1121  BP: 140/88  Pulse: 73  Temp: 97.7 F (36.5 C)  TempSrc: Temporal  SpO2: 98%  Weight: 138 lb (62.6 kg)  Height: 5\' 2"  (1.575 m)   Body mass index is 25.24 kg/m. Physical Exam Vitals reviewed.  Constitutional:      General: She is not in acute distress.    Appearance: Normal appearance. She is not toxic-appearing.  HENT:     Head: Normocephalic and atraumatic.  Eyes:     Comments: Eyes squinted nearly closed  Cardiovascular:     Rate and Rhythm: Normal rate and regular rhythm.     Pulses: Normal pulses.     Heart sounds: Normal heart sounds.  Pulmonary:     Effort: Pulmonary effort is normal.     Breath sounds: Normal breath sounds.  Abdominal:     General: Bowel sounds are normal.  Musculoskeletal:        General: Normal range of motion.     Right lower leg: No edema.     Left lower leg: No edema.     Comments: Not wearing compression hose today  Skin:    General:  Skin is warm and dry.  Neurological:     Mental Status: She is alert.     Motor: Weakness present.     Gait: Gait abnormal.     Comments: Came in wheelchair (see hpi)  Psychiatric:        Mood and Affect: Mood normal.    Labs reviewed: Basic Metabolic Panel: Recent Labs    08/19/19 1427 04/21/20 0458  NA 141 139  K 4.2 3.6  CL 107 109  CO2 21 19*  GLUCOSE 102* 150*  BUN 13 9  CREATININE 0.91* 0.91  CALCIUM 9.9 9.2   Liver Function Tests: Recent Labs    08/19/19 1427  AST 27  ALT 22  BILITOT 1.1  PROT 7.4   No results for input(s): LIPASE, AMYLASE in the last 8760 hours. No results for input(s): AMMONIA in the last 8760 hours. CBC: Recent Labs    04/21/20 0458  WBC 10.2  HGB 11.8*  HCT 34.9*  MCV 96.7  PLT 194   Lipid Panel: Recent Labs    08/19/19 1427  CHOL 155  HDL 87  LDLCALC 55  TRIG 47  CHOLHDL 1.8   Lab Results  Component Value Date   HGBA1C 5.6 04/22/2016     Assessment/Plan 1. Senile dementia with psychosis ([place]) -we agreed to reduce her risperdal from bid to daily for her hallucinations b/c her daughter and son note that she has more hallucinations after her second tablet -explained that it is likely that at that time of day/evening, she is developing some sundowning and having more hallucinations and that reducing the med may actually worsen this b/c it's there to treat that problem -due to their request, we are going to try to reduce it -we may need to find an alternative agent   2. Macular degeneration of left eye, unspecified type -vision is poor overall, her eyes are not fully opened  much of the time and this may also be contributing to her poor balance, dizziness along with her neuropathy, medications, advanced age/frailty, cerebrovascular disease and likely others  3. Primary open angle glaucoma (POAG) of right eye, severe stage -little vision at all, on numerous drops  4. History of cerebrovascular disease -with prior  stroke -mobility is declining, using walker short distances and wheelchair more now  -cont PT/OT for strengthening at home  5. Peripheral polyneuropathy -contributes to her unsteady gait/balance disorder  6. Weakness of both lower extremities -cont PT, OT for strengthening as able at home  7. H/O falling -her ability to get around has declined since her most recent fall per family, cont home health therapy  Labs/tests ordered:  No new added today Next appt:  08/25/2020   Chauntae Hults L. Ekam Besson, D.O. [facility] [address], [place] [address] Cell Phone (Mon-Fri 8am-5pm):  [phone] On Call:  [phone] & follow prompts after 5pm & weekends Office Phone:  [phone] Office Fax:  [phone]

## 2020-06-07 DIAGNOSIS — T402X5D Adverse effect of other opioids, subsequent encounter: Secondary | ICD-10-CM | POA: Diagnosis not present

## 2020-06-07 DIAGNOSIS — G629 Polyneuropathy, unspecified: Secondary | ICD-10-CM | POA: Diagnosis not present

## 2020-06-07 DIAGNOSIS — M1612 Unilateral primary osteoarthritis, left hip: Secondary | ICD-10-CM | POA: Diagnosis not present

## 2020-06-07 DIAGNOSIS — I739 Peripheral vascular disease, unspecified: Secondary | ICD-10-CM | POA: Diagnosis not present

## 2020-06-07 DIAGNOSIS — I2510 Atherosclerotic heart disease of native coronary artery without angina pectoris: Secondary | ICD-10-CM | POA: Diagnosis not present

## 2020-06-07 DIAGNOSIS — I10 Essential (primary) hypertension: Secondary | ICD-10-CM | POA: Diagnosis not present

## 2020-06-08 DIAGNOSIS — M1612 Unilateral primary osteoarthritis, left hip: Secondary | ICD-10-CM | POA: Diagnosis not present

## 2020-06-08 DIAGNOSIS — I2510 Atherosclerotic heart disease of native coronary artery without angina pectoris: Secondary | ICD-10-CM | POA: Diagnosis not present

## 2020-06-08 DIAGNOSIS — T402X5D Adverse effect of other opioids, subsequent encounter: Secondary | ICD-10-CM | POA: Diagnosis not present

## 2020-06-08 DIAGNOSIS — I10 Essential (primary) hypertension: Secondary | ICD-10-CM | POA: Diagnosis not present

## 2020-06-08 DIAGNOSIS — I739 Peripheral vascular disease, unspecified: Secondary | ICD-10-CM | POA: Diagnosis not present

## 2020-06-08 DIAGNOSIS — G629 Polyneuropathy, unspecified: Secondary | ICD-10-CM | POA: Diagnosis not present

## 2020-06-13 DIAGNOSIS — I739 Peripheral vascular disease, unspecified: Secondary | ICD-10-CM | POA: Diagnosis not present

## 2020-06-13 DIAGNOSIS — G629 Polyneuropathy, unspecified: Secondary | ICD-10-CM | POA: Diagnosis not present

## 2020-06-13 DIAGNOSIS — T402X5D Adverse effect of other opioids, subsequent encounter: Secondary | ICD-10-CM | POA: Diagnosis not present

## 2020-06-13 DIAGNOSIS — I2510 Atherosclerotic heart disease of native coronary artery without angina pectoris: Secondary | ICD-10-CM | POA: Diagnosis not present

## 2020-06-13 DIAGNOSIS — M1612 Unilateral primary osteoarthritis, left hip: Secondary | ICD-10-CM | POA: Diagnosis not present

## 2020-06-13 DIAGNOSIS — I10 Essential (primary) hypertension: Secondary | ICD-10-CM | POA: Diagnosis not present

## 2020-06-14 DIAGNOSIS — I739 Peripheral vascular disease, unspecified: Secondary | ICD-10-CM | POA: Diagnosis not present

## 2020-06-14 DIAGNOSIS — G629 Polyneuropathy, unspecified: Secondary | ICD-10-CM | POA: Diagnosis not present

## 2020-06-14 DIAGNOSIS — T402X5D Adverse effect of other opioids, subsequent encounter: Secondary | ICD-10-CM | POA: Diagnosis not present

## 2020-06-14 DIAGNOSIS — I2510 Atherosclerotic heart disease of native coronary artery without angina pectoris: Secondary | ICD-10-CM | POA: Diagnosis not present

## 2020-06-14 DIAGNOSIS — I10 Essential (primary) hypertension: Secondary | ICD-10-CM | POA: Diagnosis not present

## 2020-06-14 DIAGNOSIS — M1612 Unilateral primary osteoarthritis, left hip: Secondary | ICD-10-CM | POA: Diagnosis not present

## 2020-06-19 ENCOUNTER — Telehealth: Payer: Self-pay | Admitting: *Deleted

## 2020-06-19 DIAGNOSIS — T402X5D Adverse effect of other opioids, subsequent encounter: Secondary | ICD-10-CM | POA: Diagnosis not present

## 2020-06-19 DIAGNOSIS — I739 Peripheral vascular disease, unspecified: Secondary | ICD-10-CM | POA: Diagnosis not present

## 2020-06-19 DIAGNOSIS — M1612 Unilateral primary osteoarthritis, left hip: Secondary | ICD-10-CM | POA: Diagnosis not present

## 2020-06-19 DIAGNOSIS — I10 Essential (primary) hypertension: Secondary | ICD-10-CM | POA: Diagnosis not present

## 2020-06-19 DIAGNOSIS — I2510 Atherosclerotic heart disease of native coronary artery without angina pectoris: Secondary | ICD-10-CM | POA: Diagnosis not present

## 2020-06-19 DIAGNOSIS — G629 Polyneuropathy, unspecified: Secondary | ICD-10-CM | POA: Diagnosis not present

## 2020-06-19 NOTE — Telephone Encounter (Signed)
Mickel Baas with [facility] called requesting verbal orders to continue [place] PT 2x2 and 1x1.  Verbal orders given.

## 2020-06-21 DIAGNOSIS — I10 Essential (primary) hypertension: Secondary | ICD-10-CM | POA: Diagnosis not present

## 2020-06-21 DIAGNOSIS — G629 Polyneuropathy, unspecified: Secondary | ICD-10-CM | POA: Diagnosis not present

## 2020-06-21 DIAGNOSIS — I739 Peripheral vascular disease, unspecified: Secondary | ICD-10-CM | POA: Diagnosis not present

## 2020-06-21 DIAGNOSIS — I2510 Atherosclerotic heart disease of native coronary artery without angina pectoris: Secondary | ICD-10-CM | POA: Diagnosis not present

## 2020-06-21 DIAGNOSIS — M1612 Unilateral primary osteoarthritis, left hip: Secondary | ICD-10-CM | POA: Diagnosis not present

## 2020-06-21 DIAGNOSIS — T402X5D Adverse effect of other opioids, subsequent encounter: Secondary | ICD-10-CM | POA: Diagnosis not present

## 2020-06-22 DIAGNOSIS — T402X5D Adverse effect of other opioids, subsequent encounter: Secondary | ICD-10-CM | POA: Diagnosis not present

## 2020-06-22 DIAGNOSIS — I10 Essential (primary) hypertension: Secondary | ICD-10-CM | POA: Diagnosis not present

## 2020-06-22 DIAGNOSIS — G629 Polyneuropathy, unspecified: Secondary | ICD-10-CM | POA: Diagnosis not present

## 2020-06-22 DIAGNOSIS — I739 Peripheral vascular disease, unspecified: Secondary | ICD-10-CM | POA: Diagnosis not present

## 2020-06-22 DIAGNOSIS — I2510 Atherosclerotic heart disease of native coronary artery without angina pectoris: Secondary | ICD-10-CM | POA: Diagnosis not present

## 2020-06-22 DIAGNOSIS — M1612 Unilateral primary osteoarthritis, left hip: Secondary | ICD-10-CM | POA: Diagnosis not present

## 2020-06-23 ENCOUNTER — Other Ambulatory Visit: Payer: Self-pay | Admitting: Internal Medicine

## 2020-06-26 DIAGNOSIS — H401124 Primary open-angle glaucoma, left eye, indeterminate stage: Secondary | ICD-10-CM | POA: Diagnosis not present

## 2020-06-26 DIAGNOSIS — H401113 Primary open-angle glaucoma, right eye, severe stage: Secondary | ICD-10-CM | POA: Diagnosis not present

## 2020-06-27 DIAGNOSIS — I739 Peripheral vascular disease, unspecified: Secondary | ICD-10-CM | POA: Diagnosis not present

## 2020-06-27 DIAGNOSIS — I2510 Atherosclerotic heart disease of native coronary artery without angina pectoris: Secondary | ICD-10-CM | POA: Diagnosis not present

## 2020-06-27 DIAGNOSIS — G629 Polyneuropathy, unspecified: Secondary | ICD-10-CM | POA: Diagnosis not present

## 2020-06-27 DIAGNOSIS — I10 Essential (primary) hypertension: Secondary | ICD-10-CM | POA: Diagnosis not present

## 2020-06-27 DIAGNOSIS — T402X5D Adverse effect of other opioids, subsequent encounter: Secondary | ICD-10-CM | POA: Diagnosis not present

## 2020-06-27 DIAGNOSIS — M1612 Unilateral primary osteoarthritis, left hip: Secondary | ICD-10-CM | POA: Diagnosis not present

## 2020-06-28 DIAGNOSIS — M1612 Unilateral primary osteoarthritis, left hip: Secondary | ICD-10-CM | POA: Diagnosis not present

## 2020-06-28 DIAGNOSIS — I10 Essential (primary) hypertension: Secondary | ICD-10-CM | POA: Diagnosis not present

## 2020-06-28 DIAGNOSIS — I2510 Atherosclerotic heart disease of native coronary artery without angina pectoris: Secondary | ICD-10-CM | POA: Diagnosis not present

## 2020-06-28 DIAGNOSIS — I739 Peripheral vascular disease, unspecified: Secondary | ICD-10-CM | POA: Diagnosis not present

## 2020-06-28 DIAGNOSIS — G629 Polyneuropathy, unspecified: Secondary | ICD-10-CM | POA: Diagnosis not present

## 2020-06-28 DIAGNOSIS — T402X5D Adverse effect of other opioids, subsequent encounter: Secondary | ICD-10-CM | POA: Diagnosis not present

## 2020-07-03 ENCOUNTER — Telehealth: Payer: Self-pay

## 2020-07-03 DIAGNOSIS — T402X5D Adverse effect of other opioids, subsequent encounter: Secondary | ICD-10-CM | POA: Diagnosis not present

## 2020-07-03 DIAGNOSIS — I739 Peripheral vascular disease, unspecified: Secondary | ICD-10-CM | POA: Diagnosis not present

## 2020-07-03 DIAGNOSIS — G629 Polyneuropathy, unspecified: Secondary | ICD-10-CM | POA: Diagnosis not present

## 2020-07-03 DIAGNOSIS — I2510 Atherosclerotic heart disease of native coronary artery without angina pectoris: Secondary | ICD-10-CM | POA: Diagnosis not present

## 2020-07-03 DIAGNOSIS — I10 Essential (primary) hypertension: Secondary | ICD-10-CM | POA: Diagnosis not present

## 2020-07-03 DIAGNOSIS — M1612 Unilateral primary osteoarthritis, left hip: Secondary | ICD-10-CM | POA: Diagnosis not present

## 2020-07-03 NOTE — Telephone Encounter (Signed)
Message left on clinical intake voicemail:   [name] [name] with [facility] called requesting verbal order to continue home health 1 x weekly for 4 weeks.  Per [facility] standing order, call was returned to [place] and verbal order given. Message will be sent to patient's provider as a FYI.

## 2020-07-04 ENCOUNTER — Other Ambulatory Visit: Payer: Self-pay

## 2020-07-04 ENCOUNTER — Encounter: Payer: Self-pay | Admitting: Podiatry

## 2020-07-04 ENCOUNTER — Ambulatory Visit (INDEPENDENT_AMBULATORY_CARE_PROVIDER_SITE_OTHER): Payer: Medicare Other | Admitting: Podiatry

## 2020-07-04 ENCOUNTER — Other Ambulatory Visit: Payer: Self-pay | Admitting: Internal Medicine

## 2020-07-04 DIAGNOSIS — M1612 Unilateral primary osteoarthritis, left hip: Secondary | ICD-10-CM | POA: Diagnosis not present

## 2020-07-04 DIAGNOSIS — M79675 Pain in left toe(s): Secondary | ICD-10-CM

## 2020-07-04 DIAGNOSIS — M79674 Pain in right toe(s): Secondary | ICD-10-CM | POA: Diagnosis not present

## 2020-07-04 DIAGNOSIS — B351 Tinea unguium: Secondary | ICD-10-CM

## 2020-07-04 DIAGNOSIS — H409 Unspecified glaucoma: Secondary | ICD-10-CM | POA: Diagnosis not present

## 2020-07-04 DIAGNOSIS — T402X5D Adverse effect of other opioids, subsequent encounter: Secondary | ICD-10-CM | POA: Diagnosis not present

## 2020-07-04 DIAGNOSIS — R531 Weakness: Secondary | ICD-10-CM | POA: Diagnosis not present

## 2020-07-04 DIAGNOSIS — G5793 Unspecified mononeuropathy of bilateral lower limbs: Secondary | ICD-10-CM

## 2020-07-04 DIAGNOSIS — M170 Bilateral primary osteoarthritis of knee: Secondary | ICD-10-CM | POA: Diagnosis not present

## 2020-07-04 DIAGNOSIS — J4520 Mild intermittent asthma, uncomplicated: Secondary | ICD-10-CM

## 2020-07-04 DIAGNOSIS — G629 Polyneuropathy, unspecified: Secondary | ICD-10-CM | POA: Diagnosis not present

## 2020-07-04 DIAGNOSIS — K219 Gastro-esophageal reflux disease without esophagitis: Secondary | ICD-10-CM | POA: Diagnosis not present

## 2020-07-04 DIAGNOSIS — I2510 Atherosclerotic heart disease of native coronary artery without angina pectoris: Secondary | ICD-10-CM | POA: Diagnosis not present

## 2020-07-04 DIAGNOSIS — H3530 Unspecified macular degeneration: Secondary | ICD-10-CM | POA: Diagnosis not present

## 2020-07-04 DIAGNOSIS — I739 Peripheral vascular disease, unspecified: Secondary | ICD-10-CM | POA: Diagnosis not present

## 2020-07-04 DIAGNOSIS — I10 Essential (primary) hypertension: Secondary | ICD-10-CM | POA: Diagnosis not present

## 2020-07-04 DIAGNOSIS — Z8673 Personal history of transient ischemic attack (TIA), and cerebral infarction without residual deficits: Secondary | ICD-10-CM | POA: Diagnosis not present

## 2020-07-04 DIAGNOSIS — F419 Anxiety disorder, unspecified: Secondary | ICD-10-CM

## 2020-07-05 DIAGNOSIS — M1612 Unilateral primary osteoarthritis, left hip: Secondary | ICD-10-CM | POA: Diagnosis not present

## 2020-07-05 DIAGNOSIS — T402X5D Adverse effect of other opioids, subsequent encounter: Secondary | ICD-10-CM | POA: Diagnosis not present

## 2020-07-05 DIAGNOSIS — I10 Essential (primary) hypertension: Secondary | ICD-10-CM | POA: Diagnosis not present

## 2020-07-05 DIAGNOSIS — I739 Peripheral vascular disease, unspecified: Secondary | ICD-10-CM | POA: Diagnosis not present

## 2020-07-05 DIAGNOSIS — G629 Polyneuropathy, unspecified: Secondary | ICD-10-CM | POA: Diagnosis not present

## 2020-07-05 DIAGNOSIS — I2510 Atherosclerotic heart disease of native coronary artery without angina pectoris: Secondary | ICD-10-CM | POA: Diagnosis not present

## 2020-07-06 NOTE — Progress Notes (Signed)
Subjective: [name] [name] presents today for follow up of at risk foot care with history of peripheral neuropathy and corn(s) left 4th digit and painful mycotic toenails b/l that are difficult to trim. Pain interferes with ambulation. Aggravating factors include wearing enclosed shoe gear. Pain is relieved with periodic professional debridement.   She relates she had a fall in July. She is now in a wheelchair when she has to walk long distances. She is still seeing physical therapist at home.  Allergies  Allergen Reactions  . Aspirin Other (See Comments)    sweats  . Codeine Other (See Comments)  . Iodine Other (See Comments)    Pt is not aware of this allergy, does not recall much info.  . Prednisone Other (See Comments)    sweats  . Lyrica [Pregabalin]     Wobbly, "more than sleepy"  . Benzonatate Itching and Rash  . Sulfa Antibiotics Other (See Comments)    Doesn't remember    ROS: Numbness of feet b/l  Objective: There were no vitals filed for this visit.  Pt 86 y.o. year old female  in NAD. AAO x 3.   Vascular Examination:  Capillary fill time to digits <3 seconds b/l. Palpable DP pulses b/l. Palpable PT pulses b/l. Pedal hair absent b/l Skin temperature gradient within normal limits b/l.  Dermatological Examination: Pedal skin with normal turgor, texture and tone bilaterally. No open wounds bilaterally. No interdigital macerations bilaterally. Toenails 1-5 b/l elongated, discolored, dystrophic, thickened, crumbly with subungual debris and tenderness to dorsal palpation. No hyperkeratotic nor porokeratotic lesions present on today's visit.  Musculoskeletal: Normal muscle strength 5/5 to all lower extremity muscle groups bilaterally. No pain crepitus or joint limitation noted with ROM b/l. Hammertoes noted to the L 4th toe. Wearing appropriate fitting shoe gear. Utilizes wheelchair for mobility assistance.  Neurological: Pt has subjective symptoms of neuropathy. Protective  sensation intact 5/5 intact bilaterally with 10g monofilament b/l. Vibratory sensation intact b/l. Proprioception intact bilaterally. Achilles reflex 2+ b/l. Babinski reflex negative b/l.  Assessment: 1. Pain due to onychomycosis of toenails of both feet   2. Neuropathy of both feet    Plan: -Toenails 1-5 b/l were debrided in length and girth with sterile nail nippers and dremel without iatrogenic bleeding.  -She has no corn formation on left 4th toe on today's visit. -For neuropathy, continue compounded topical neuropathy cream from [place]. -Patient to continue soft, supportive shoe gear daily. -Patient to report any pedal injuries to medical professional immediately. -Patient/POA to call should there be question/concern in the interim.  Return in about 4 months (around 11/03/2020) for toenail debridement w/corn(s)/callus(es), neuropathy.

## 2020-07-10 DIAGNOSIS — I739 Peripheral vascular disease, unspecified: Secondary | ICD-10-CM | POA: Diagnosis not present

## 2020-07-10 DIAGNOSIS — M1612 Unilateral primary osteoarthritis, left hip: Secondary | ICD-10-CM | POA: Diagnosis not present

## 2020-07-10 DIAGNOSIS — I2510 Atherosclerotic heart disease of native coronary artery without angina pectoris: Secondary | ICD-10-CM | POA: Diagnosis not present

## 2020-07-10 DIAGNOSIS — T402X5D Adverse effect of other opioids, subsequent encounter: Secondary | ICD-10-CM | POA: Diagnosis not present

## 2020-07-10 DIAGNOSIS — G629 Polyneuropathy, unspecified: Secondary | ICD-10-CM | POA: Diagnosis not present

## 2020-07-10 DIAGNOSIS — I10 Essential (primary) hypertension: Secondary | ICD-10-CM | POA: Diagnosis not present

## 2020-07-14 DIAGNOSIS — M1612 Unilateral primary osteoarthritis, left hip: Secondary | ICD-10-CM | POA: Diagnosis not present

## 2020-07-14 DIAGNOSIS — G629 Polyneuropathy, unspecified: Secondary | ICD-10-CM | POA: Diagnosis not present

## 2020-07-14 DIAGNOSIS — I739 Peripheral vascular disease, unspecified: Secondary | ICD-10-CM | POA: Diagnosis not present

## 2020-07-14 DIAGNOSIS — T402X5D Adverse effect of other opioids, subsequent encounter: Secondary | ICD-10-CM | POA: Diagnosis not present

## 2020-07-14 DIAGNOSIS — I2510 Atherosclerotic heart disease of native coronary artery without angina pectoris: Secondary | ICD-10-CM | POA: Diagnosis not present

## 2020-07-14 DIAGNOSIS — I10 Essential (primary) hypertension: Secondary | ICD-10-CM | POA: Diagnosis not present

## 2020-07-18 DIAGNOSIS — I10 Essential (primary) hypertension: Secondary | ICD-10-CM | POA: Diagnosis not present

## 2020-07-18 DIAGNOSIS — T402X5D Adverse effect of other opioids, subsequent encounter: Secondary | ICD-10-CM | POA: Diagnosis not present

## 2020-07-18 DIAGNOSIS — I739 Peripheral vascular disease, unspecified: Secondary | ICD-10-CM | POA: Diagnosis not present

## 2020-07-18 DIAGNOSIS — G629 Polyneuropathy, unspecified: Secondary | ICD-10-CM | POA: Diagnosis not present

## 2020-07-18 DIAGNOSIS — M1612 Unilateral primary osteoarthritis, left hip: Secondary | ICD-10-CM | POA: Diagnosis not present

## 2020-07-18 DIAGNOSIS — I2510 Atherosclerotic heart disease of native coronary artery without angina pectoris: Secondary | ICD-10-CM | POA: Diagnosis not present

## 2020-07-19 DIAGNOSIS — I739 Peripheral vascular disease, unspecified: Secondary | ICD-10-CM | POA: Diagnosis not present

## 2020-07-19 DIAGNOSIS — G629 Polyneuropathy, unspecified: Secondary | ICD-10-CM | POA: Diagnosis not present

## 2020-07-19 DIAGNOSIS — T402X5D Adverse effect of other opioids, subsequent encounter: Secondary | ICD-10-CM | POA: Diagnosis not present

## 2020-07-19 DIAGNOSIS — I10 Essential (primary) hypertension: Secondary | ICD-10-CM | POA: Diagnosis not present

## 2020-07-19 DIAGNOSIS — M1612 Unilateral primary osteoarthritis, left hip: Secondary | ICD-10-CM | POA: Diagnosis not present

## 2020-07-19 DIAGNOSIS — I2510 Atherosclerotic heart disease of native coronary artery without angina pectoris: Secondary | ICD-10-CM | POA: Diagnosis not present

## 2020-07-21 ENCOUNTER — Ambulatory Visit (INDEPENDENT_AMBULATORY_CARE_PROVIDER_SITE_OTHER): Payer: Medicare Other

## 2020-07-21 ENCOUNTER — Other Ambulatory Visit: Payer: Self-pay

## 2020-07-21 DIAGNOSIS — Z23 Encounter for immunization: Secondary | ICD-10-CM | POA: Diagnosis not present

## 2020-07-25 DIAGNOSIS — T402X5D Adverse effect of other opioids, subsequent encounter: Secondary | ICD-10-CM | POA: Diagnosis not present

## 2020-07-25 DIAGNOSIS — I739 Peripheral vascular disease, unspecified: Secondary | ICD-10-CM | POA: Diagnosis not present

## 2020-07-25 DIAGNOSIS — I10 Essential (primary) hypertension: Secondary | ICD-10-CM | POA: Diagnosis not present

## 2020-07-25 DIAGNOSIS — G629 Polyneuropathy, unspecified: Secondary | ICD-10-CM | POA: Diagnosis not present

## 2020-07-25 DIAGNOSIS — I2510 Atherosclerotic heart disease of native coronary artery without angina pectoris: Secondary | ICD-10-CM | POA: Diagnosis not present

## 2020-07-25 DIAGNOSIS — M1612 Unilateral primary osteoarthritis, left hip: Secondary | ICD-10-CM | POA: Diagnosis not present

## 2020-07-26 DIAGNOSIS — I739 Peripheral vascular disease, unspecified: Secondary | ICD-10-CM | POA: Diagnosis not present

## 2020-07-26 DIAGNOSIS — G629 Polyneuropathy, unspecified: Secondary | ICD-10-CM | POA: Diagnosis not present

## 2020-07-26 DIAGNOSIS — T402X5D Adverse effect of other opioids, subsequent encounter: Secondary | ICD-10-CM | POA: Diagnosis not present

## 2020-07-26 DIAGNOSIS — I10 Essential (primary) hypertension: Secondary | ICD-10-CM | POA: Diagnosis not present

## 2020-07-26 DIAGNOSIS — I2510 Atherosclerotic heart disease of native coronary artery without angina pectoris: Secondary | ICD-10-CM | POA: Diagnosis not present

## 2020-07-26 DIAGNOSIS — M1612 Unilateral primary osteoarthritis, left hip: Secondary | ICD-10-CM | POA: Diagnosis not present

## 2020-07-27 ENCOUNTER — Other Ambulatory Visit: Payer: Self-pay | Admitting: Internal Medicine

## 2020-08-01 DIAGNOSIS — T402X5D Adverse effect of other opioids, subsequent encounter: Secondary | ICD-10-CM | POA: Diagnosis not present

## 2020-08-01 DIAGNOSIS — I739 Peripheral vascular disease, unspecified: Secondary | ICD-10-CM | POA: Diagnosis not present

## 2020-08-01 DIAGNOSIS — I10 Essential (primary) hypertension: Secondary | ICD-10-CM | POA: Diagnosis not present

## 2020-08-01 DIAGNOSIS — I2510 Atherosclerotic heart disease of native coronary artery without angina pectoris: Secondary | ICD-10-CM | POA: Diagnosis not present

## 2020-08-01 DIAGNOSIS — G629 Polyneuropathy, unspecified: Secondary | ICD-10-CM | POA: Diagnosis not present

## 2020-08-01 DIAGNOSIS — M1612 Unilateral primary osteoarthritis, left hip: Secondary | ICD-10-CM | POA: Diagnosis not present

## 2020-08-16 ENCOUNTER — Other Ambulatory Visit: Payer: Self-pay | Admitting: Internal Medicine

## 2020-08-19 DIAGNOSIS — Z23 Encounter for immunization: Secondary | ICD-10-CM | POA: Diagnosis not present

## 2020-08-23 ENCOUNTER — Encounter: Payer: Medicare Other | Admitting: Family

## 2020-08-25 ENCOUNTER — Telehealth: Payer: Self-pay

## 2020-08-25 ENCOUNTER — Ambulatory Visit (INDEPENDENT_AMBULATORY_CARE_PROVIDER_SITE_OTHER): Payer: Medicare Other | Admitting: Family

## 2020-08-25 ENCOUNTER — Encounter: Payer: Self-pay | Admitting: Family

## 2020-08-25 ENCOUNTER — Other Ambulatory Visit: Payer: Self-pay

## 2020-08-25 DIAGNOSIS — Z23 Encounter for immunization: Secondary | ICD-10-CM

## 2020-08-25 DIAGNOSIS — Z0000 Encounter for general adult medical examination without abnormal findings: Secondary | ICD-10-CM

## 2020-08-25 MED ORDER — TETANUS-DIPHTH-ACELL PERTUSSIS 5-2-15.5 LF-MCG/0.5 IM SUSP: 0.5000 mL | Freq: Once | INTRAMUSCULAR | 0 refills | Status: AC

## 2020-08-25 NOTE — Patient Instructions (Signed)
[name] , Thank you for taking time to come for your Medicare Wellness Visit. I appreciate your ongoing commitment to your health goals. Please review the following plan we discussed and let me know if I can assist you in the future.   Screening recommendations/referrals: Colonoscopy: N/A  Mammogram : N/A  Bone Density Declined  Recommended yearly ophthalmology/optometry visit for glaucoma screening and checkup Recommended yearly dental visit for hygiene and checkup  Vaccinations: Influenza vaccine: Up to date  Pneumococcal vaccine : Up to date  Tdap vaccine : Up to date  Shingles vaccine : Up to date    Advanced directives: Yes   Conditions/risks identified: Advance Age female > 71 yrs,Hypertension,dyslipidemia    Next appointment: 1 year    Preventive Care 45 Years and Older, Female Preventive care refers to lifestyle choices and visits with your health care provider that can promote health and wellness. What does preventive care include?  A yearly physical exam. This is also called an annual well check.  Dental exams once or twice a year.  Routine eye exams. Ask your health care provider how often you should have your eyes checked.  Personal lifestyle choices, including:  Daily care of your teeth and gums.  Regular physical activity.  Eating a healthy diet.  Avoiding tobacco and drug use.  Limiting alcohol use.  Practicing safe sex.  Taking low-dose aspirin every day.  Taking vitamin and mineral supplements as recommended by your health care provider. What happens during an annual well check? The services and screenings done by your health care provider during your annual well check will depend on your age, overall health, lifestyle risk factors, and family history of disease. Counseling  Your health care provider may ask you questions about your:  Alcohol use.  Tobacco use.  Drug use.  Emotional well-being.  Home and relationship  well-being.  Sexual activity.  Eating habits.  History of falls.  Memory and ability to understand (cognition).  Work and work Statistician.  Reproductive health. Screening  You may have the following tests or measurements:  Height, weight, and BMI.  Blood pressure.  Lipid and cholesterol levels. These may be checked every 5 years, or more frequently if you are over 87 years old.  Skin check.  Lung cancer screening. You may have this screening every year starting at age 59 if you have a 30-pack-year history of smoking and currently smoke or have quit within the past 15 years.  Fecal occult blood test (FOBT) of the stool. You may have this test every year starting at age 89.  Flexible sigmoidoscopy or colonoscopy. You may have a sigmoidoscopy every 5 years or a colonoscopy every 10 years starting at age 21.  Hepatitis C blood test.  Hepatitis B blood test.  Sexually transmitted disease (STD) testing.  Diabetes screening. This is done by checking your blood sugar (glucose) after you have not eaten for a while (fasting). You may have this done every 1-3 years.  Bone density scan. This is done to screen for osteoporosis. You may have this done starting at age 45.  Mammogram. This may be done every 1-2 years. Talk to your health care provider about how often you should have regular mammograms. Talk with your health care provider about your test results, treatment options, and if necessary, the need for more tests. Vaccines  Your health care provider may recommend certain vaccines, such as:  Influenza vaccine. This is recommended every year.  Tetanus, diphtheria, and acellular pertussis (Tdap,  Td) vaccine. You may need a Td booster every 10 years.  Zoster vaccine. You may need this after age 63.  Pneumococcal 13-valent conjugate (PCV13) vaccine. One dose is recommended after age 23.  Pneumococcal polysaccharide (PPSV23) vaccine. One dose is recommended after age  2. Talk to your health care provider about which screenings and vaccines you need and how often you need them. This information is not intended to replace advice given to you by your health care provider. Make sure you discuss any questions you have with your health care provider. Document Released: 10/20/2015 Document Revised: 06/12/2016 Document Reviewed: 07/25/2015 Elsevier Interactive Patient Education  2017 [employer] Prevention in the Home Falls can cause injuries. They can happen to people of all ages. There are many things you can do to make your home safe and to help prevent falls. What can I do on the outside of my home?  Regularly fix the edges of walkways and driveways and fix any cracks.  Remove anything that might make you trip as you walk through a door, such as a raised step or threshold.  Trim any bushes or trees on the path to your home.  Use bright outdoor lighting.  Clear any walking paths of anything that might make someone trip, such as rocks or tools.  Regularly check to see if handrails are loose or broken. Make sure that both sides of any steps have handrails.  Any raised decks and porches should have guardrails on the edges.  Have any leaves, snow, or ice cleared regularly.  Use sand or salt on walking paths during winter.  Clean up any spills in your garage right away. This includes oil or grease spills. What can I do in the bathroom?  Use night lights.  Install grab bars by the toilet and in the tub and shower. Do not use towel bars as grab bars.  Use non-skid mats or decals in the tub or shower.  If you need to sit down in the shower, use a plastic, non-slip stool.  Keep the floor dry. Clean up any water that spills on the floor as soon as it happens.  Remove soap buildup in the tub or shower regularly.  Attach bath mats securely with double-sided non-slip rug tape.  Do not have throw rugs and other things on the floor that can make  you trip. What can I do in the bedroom?  Use night lights.  Make sure that you have a light by your bed that is easy to reach.  Do not use any sheets or blankets that are too big for your bed. They should not hang down onto the floor.  Have a firm chair that has side arms. You can use this for support while you get dressed.  Do not have throw rugs and other things on the floor that can make you trip. What can I do in the kitchen?  Clean up any spills right away.  Avoid walking on wet floors.  Keep items that you use a lot in easy-to-reach places.  If you need to reach something above you, use a strong step stool that has a grab bar.  Keep electrical cords out of the way.  Do not use floor polish or wax that makes floors slippery. If you must use wax, use non-skid floor wax.  Do not have throw rugs and other things on the floor that can make you trip. What can I do with my stairs?  Do not  leave any items on the stairs.  Make sure that there are handrails on both sides of the stairs and use them. Fix handrails that are broken or loose. Make sure that handrails are as long as the stairways.  Check any carpeting to make sure that it is firmly attached to the stairs. Fix any carpet that is loose or worn.  Avoid having throw rugs at the top or bottom of the stairs. If you do have throw rugs, attach them to the floor with carpet tape.  Make sure that you have a light switch at the top of the stairs and the bottom of the stairs. If you do not have them, ask someone to add them for you. What else can I do to help prevent falls?  Wear shoes that:  Do not have high heels.  Have rubber bottoms.  Are comfortable and fit you well.  Are closed at the toe. Do not wear sandals.  If you use a stepladder:  Make sure that it is fully opened. Do not climb a closed stepladder.  Make sure that both sides of the stepladder are locked into place.  Ask someone to hold it for you, if  possible.  Clearly mark and make sure that you can see:  Any grab bars or handrails.  First and last steps.  Where the edge of each step is.  Use tools that help you move around (mobility aids) if they are needed. These include:  Canes.  Walkers.  Scooters.  Crutches.  Turn on the lights when you go into a dark area. Replace any light bulbs as soon as they burn out.  Set up your furniture so you have a clear path. Avoid moving your furniture around.  If any of your floors are uneven, fix them.  If there are any pets around you, be aware of where they are.  Review your medicines with your doctor. Some medicines can make you feel dizzy. This can increase your chance of falling. Ask your doctor what other things that you can do to help prevent falls. This information is not intended to replace advice given to you by your health care provider. Make sure you discuss any questions you have with your health care provider. Document Released: 07/20/2009 Document Revised: 02/29/2016 Document Reviewed: 10/28/2014 Elsevier Interactive Patient Education  2017 [employer].

## 2020-08-25 NOTE — Telephone Encounter (Signed)
Ms. marili, vader are scheduled for a virtual visit with your provider today.    Just as we do with appointments in the office, we must obtain your consent to participate.  Your consent will be active for this visit and any virtual visit you may have with one of our providers in the next 365 days.    If you have a MyChart account, I can also send a copy of this consent to you electronically.  All virtual visits are billed to your insurance company just like a traditional visit in the office.  As this is a virtual visit, video technology does not allow for your provider to perform a traditional examination.  This may limit your provider's ability to fully assess your condition.  If your provider identifies any concerns that need to be evaluated in person or the need to arrange testing such as labs, EKG, etc, we will make arrangements to do so.    Although advances in technology are sophisticated, we cannot ensure that it will always work on either your end or our end.  If the connection with a video visit is poor, we may have to switch to a telephone visit.  With either a video or telephone visit, we are not always able to ensure that we have a secure connection.   I need to obtain your verbal consent now.   Are you willing to proceed with your visit today?   DORENA DORFMAN has provided verbal consent on 08/25/2020 for a virtual visit (video or telephone).   [place], [place] 08/25/2020  10:28 AM

## 2020-08-25 NOTE — Progress Notes (Signed)
This service is provided via telemedicine  No vital signs collected/recorded due to the encounter was a telemedicine visit.   Location of patient (ex: home, work): Home.  Patient consents to a telephone visit: Yes.  Location of the provider (ex: office, home):  [facility].  Name of any referring provider:  Gayland Curry, DO   Names of all persons participating in the telemedicine service and their role in the encounter: Patient, Heriberto Antigua, [place], [place], Webb Silversmith, NP.    Time spent on call: 8 minutes spent on the phone with Medical Assistant.     Subjective:   [name] [name] is a 86 y.o. female who presents for Medicare Annual (Subsequent) preventive examination.  Review of Systems     Cardiac Risk Factors include: advanced age (>38men, >32 women);dyslipidemia;hypertension     Objective:    There were no vitals filed for this visit. There is no height or weight on file to calculate BMI.  Advanced Directives 08/25/2020 06/05/2020 05/02/2020 08/23/2019 08/19/2019 04/26/2019 04/14/2019  Does Patient Have a Medical Advance Directive? Yes Yes Yes Yes Unable to assess, patient is non-responsive or altered mental status Yes No  Type of Advance Directive Out of facility DNR (pink MOST or yellow form) Out of facility DNR (pink MOST or yellow form) Out of facility DNR (pink MOST or yellow form) Living will;Healthcare Power of Attorney - Out of facility DNR (pink MOST or yellow form) -  Does patient want to make changes to medical advance directive? No - Patient declined No - Patient declined No - Patient declined No - Patient declined - No - Patient declined -  Copy of [place] in Chart? - - - Yes - validated most recent copy scanned in chart (See row information) - - -  Would patient like information on creating a medical advance directive? - - - - - No - Patient declined No - Patient declined  Pre-existing out of facility DNR order (yellow form or pink  MOST form) - - - - - Yellow form placed in chart (order not valid for inpatient use) -    Current Medications (verified) Outpatient Encounter Medications as of 08/25/2020  Medication Sig  . acetaminophen (TYLENOL) 500 MG tablet Take 500 mg by mouth every 6 (six) hours as needed for mild pain or moderate pain.   Marland Kitchen albuterol (PROVENTIL HFA;VENTOLIN HFA) 108 (90 Base) MCG/ACT inhaler Inhale 2 puffs into the lungs every 6 (six) hours as needed for wheezing.  Marland Kitchen alum hydroxide-mag trisilicate (GAVISCON) 09-98 MG CHEW chewable tablet Chew 2 tablets by mouth 2 (two) times daily.   Marland Kitchen amLODipine (NORVASC) 5 MG tablet Take 1 tablet (5 mg total) by mouth daily.  Marland Kitchen atorvastatin (LIPITOR) 10 MG tablet TAKE 1 TABLET BY MOUTH EVERY DAY  . atropine 1 % ophthalmic solution Place 1 drop into the right eye daily.   . B Complex Vitamins (B COMPLEX PO) Take 1 tablet by mouth daily.  . Brinzolamide-Brimonidine (SIMBRINZA) 1-0.2 % SUSP Apply 1 drop to eye 3 (three) times daily.  . budesonide-formoterol (SYMBICORT) 80-4.5 MCG/ACT inhaler Inhale 2 puffs into the lungs 2 (two) times daily.  . Cholecalciferol (VITAMIN D3) 2000 units TABS Take 2,000 Units by mouth daily.  . clopidogrel (PLAVIX) 75 MG tablet TAKE 1 TABLET BY MOUTH EVERY DAY  . Coenzyme Q10 (COQ10) 50 MG CAPS Take 50 mg by mouth daily.  Marland Kitchen docusate sodium (COLACE) 100 MG capsule Take 1 capsule (100 mg total)  by mouth 2 (two) times daily.  . fluticasone (FLONASE) 50 MCG/ACT nasal spray Place 1 spray into the nose daily.  . hydrocortisone (ANUSOL-HC) 2.5 % rectal cream Place 1 application rectally daily as needed for hemorrhoids or anal itching.  . Loratadine (CLARITIN PO) Take 5 mg by mouth as needed.  . Menthol, Topical Analgesic, (BIOFREEZE) 4 % GEL Apply 1 application topically 3 (three) times daily. Apply to knee  . Multiple Vitamins-Minerals (PRESERVISION AREDS 2) CAPS Take 1 capsule by mouth 2 (two) times daily.  . Multiple Vitamins-Minerals (SENIOR  MULTIVITAMIN PLUS) TABS Take 1 tablet by mouth daily.   . NONFORMULARY OR COMPOUNDED ITEM 1 application 2 (two) times daily as needed. [facility]:  Peripheral Neuropathy Cream - Bupivacaine 1%, Doxepin 3%, Gabapentin 6%, Pentoxifylline 3%, Topiramate 1%, apply 1-2 grams to affected areas 3-4 times daily prn.  . Omega-3 Fatty Acids (FISH OIL) 1200 MG CPDR Take 1,200 mg by mouth daily. Take one tablet by mouth once daily   . pantoprazole (PROTONIX) 40 MG tablet Take 1 tablet (40 mg total) by mouth 2 (two) times daily before a meal.  . prednisoLONE acetate (PRED FORTE) 1 % ophthalmic suspension Place 1 drop into the right eye daily.  Marland Kitchen PREDNISOLONE ACETATE OP Place 1 drop into the right eye 3 (three) times daily.  . risperiDONE (RISPERDAL) 0.5 MG tablet Take 1 tablet (0.5 mg total) by mouth daily.  . travoprost, benzalkonium, (TRAVATAN) 0.004 % ophthalmic solution Place 1 drop into the left eye at bedtime.  . TROPICAMIDE OP Place 1 drop into the right eye daily.  . vitamin C (ASCORBIC ACID) 500 MG tablet Take 500 mg by mouth daily.   No facility-administered encounter medications on file as of 08/25/2020.    Allergies (verified) Aspirin, Codeine, Iodine, Prednisone, Lyrica [pregabalin], Benzonatate, and Sulfa antibiotics   History: Past Medical History:  Diagnosis Date  . Anxiety   . Asthma   . Benign essential hypertension   . Bradycardia, drug induced 06/11/2016  . Edema extremities   . GERD (gastroesophageal reflux disease)   . Glaucoma   . Macular degeneration    Per patient   . Osteoarthritis   . PVD (peripheral vascular disease) ([place])   . Seasonal allergies   . Stroke [facility])    Past Surgical History:  Procedure Laterality Date  . ABDOMINAL HYSTERECTOMY  1981  . TONSILLECTOMY  1953   Family History  Problem Relation Age of Onset  . Stroke Father   . Hypertension Father   . Heart disease Mother   . Hypertension Mother   . Diabetes Mother    Social History    Socioeconomic History  . Marital status: Widowed    Spouse name: Not on file  . Number of children: Not on file  . Years of education: Not on file  . Highest education level: Not on file  Occupational History  . Not on file  Tobacco Use  . Smoking status: Never Smoker  . Smokeless tobacco: Never Used  Vaping Use  . Vaping Use: Never used  Substance and Sexual Activity  . Alcohol use: No    Alcohol/week: 0.0 standard drinks  . Drug use: No  . Sexual activity: Not Currently  Other Topics Concern  . 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 Difficulty of  Paying Living Expenses: Not on file  Food Insecurity:   . Worried About Charity fundraiser in the Last Year: Not on file  . Ran Out of Food in the Last Year: Not on file  Transportation Needs:   . Lack of Transportation (Medical): Not on file  . Lack of Transportation (Non-Medical): Not on file  Physical Activity:   . Days of Exercise per Week: Not on file  . Minutes of Exercise per Session: Not on file  Stress:   . Feeling of Stress : Not on file  Social Connections:   . Frequency of Communication with Friends and Family: Not on file  . Frequency of Social Gatherings with Friends and Family: Not on file  . Attends Religious Services: Not on file  . Active Member of Clubs or Organizations: Not on file  . Attends Archivist Meetings: Not on file  . Marital Status: Not on file    Tobacco Counseling Counseling given: Not Answered   Clinical Intake:  Pre-visit preparation completed: No  Pain : No/denies pain     BMI - recorded: 25.24 Nutritional Status: BMI 25 -29 Overweight Nutritional Risks: None Diabetes: No  How often do you need to have someone help you when you read instructions, pamphlets, or other written materials from your doctor or  pharmacy?: 5 - Always (due to vision) What is the last grade level you completed in school?: 12 Grade  Diabetic?no   Interpreter Needed?: No  Information entered by :: Allani Reber FNP-C   Activities of Daily Living In your present state of health, do you have any difficulty performing the following activities: 08/25/2020  Hearing? N  Vision? Y  Comment Dr.Bond opthalmology  Difficulty concentrating or making decisions? N  Walking or climbing stairs? Y  Comment uses walker  Dressing or bathing? N  Doing errands, shopping? Y  Comment Son Restaurant manager, fast food and eating ? Y  Comment Son cooks  Using the Toilet? N  In the past six months, have you accidently leaked urine? N  Do you have problems with loss of bowel control? N  Managing your Medications? Y  Comment son assist  Managing your Finances? Y  Comment Son Writer or managing your Housekeeping? Y  Comment Son Assist  Some recent data might be hidden    Patient Care Team: Gayland Curry, DO as PCP - General (Geriatric Medicine) Sueanne Margarita, MD as PCP - Cardiology (Cardiology) Trula Slade, DPM as Consulting Physician (Podiatry) Bond, Tracie Harrier, MD as Referring Physician (Ophthalmology)  Indicate any recent Medical Services you may have received from other than [facility] providers in the past year (date may be approximate).     Assessment:   This is a routine wellness examination for Lattie.  Hearing/Vision screen  Hearing Screening   125Hz  250Hz  500Hz  1000Hz  2000Hz  3000Hz  4000Hz  6000Hz  8000Hz   Right ear:           Left ear:           Comments: No Hearing Concerns.  Vision Screening Comments: Some vision concerns. Last eye exam 6 months ago. Patient wears prescription glasses.   Dietary issues and exercise activities discussed: Current Exercise Habits: Home exercise routine, Type of exercise: walking, Time (Minutes): 10, Frequency (Times/Week): 7, Weekly Exercise (Minutes/Week): 70,  Intensity: Mild, Exercise limited by: Other - see comments (unsteady)  Goals    . Walking     Pt would like to walk with someone 10  days out of the month      Depression Screen PHQ 2/9 Scores 08/25/2020 06/05/2020 02/03/2020 09/27/2019 08/23/2019 05/27/2019 04/14/2019  PHQ - 2 Score 0 0 0 0 0 0 0    Fall Risk Fall Risk  08/25/2020 06/05/2020 05/02/2020 02/03/2020 09/27/2019  Falls in the past year? 1 1 1  0 0  Number falls in past yr: 0 0 1 0 0  Injury with Fall? 0 1 0 - 0    Any stairs in or around the home? Yes  If so, are there any without handrails? Yes  Home free of loose throw rugs in walkways, pet beds, electrical cords, etc? No  Adequate lighting in your home to reduce risk of falls? No   ASSISTIVE DEVICES UTILIZED TO PREVENT FALLS:  Life alert? No  Use of a cane, walker or w/c? Yes  Grab bars in the bathroom? Yes  Shower chair or bench in shower? Yes  Elevated toilet seat or a handicapped toilet? No   TIMED UP AND GO:  Was the test performed? No .  Length of time to ambulate 10 feet: N/A sec.   Gait unsteady without use of assistive device, provider informed and interventions were implemented  Cognitive Function: MMSE - Mini Mental State Exam 07/31/2018 03/24/2017 01/10/2015 06/11/2013  Orientation to time 3 4 4 5   Orientation to Place 5 5 5 5   Registration 3 3 3 3   Attention/ Calculation 5 4 4 5   Recall 2 1 2 2   Language- name 2 objects 2 2 2 2   Language- repeat 1 1 1 1   Language- follow 3 step command 3 2 3 3   Language- read & follow direction 1 1 1 1   Write a sentence 1 1 1 1   Copy design 1 1 1 1   Total score 27 25 27 29      6CIT Screen 08/25/2020 08/23/2019  What Year? 0 points 0 points  What month? 0 points 0 points  What time? 0 points 0 points  Count back from 20 0 points 0 points  Months in reverse 4 points 0 points  Repeat phrase 8 points 0 points  Total Score 12 0    Immunizations Immunization History  Administered Date(s) Administered  . Fluad  Quad(high Dose 65+) 06/04/2019, 07/21/2020  . Influenza, High Dose Seasonal PF 07/16/2017, 07/20/2018  . Influenza,inj,Quad PF,6+ Mos 06/11/2013, 06/30/2014, 07/17/2015, 06/27/2016  . Influenza-Unspecified 07/07/2012  . PFIZER SARS-COV-2 Vaccination 11/20/2019, 12/13/2019  . Pneumococcal Conjugate-13 11/07/2014  . Pneumococcal Polysaccharide-23 10/08/2007  . Zoster Recombinat (Shingrix) 07/31/2017, 12/09/2017    TDAP status: Due, Education has been provided regarding the importance of this vaccine. Advised may receive this vaccine at local pharmacy or Health Dept. Aware to provide a copy of the vaccination record if obtained from local pharmacy or Health Dept. Verbalized acceptance and understanding. Flu Vaccine status: Up to date Pneumococcal vaccine status: Up to date Covid-19 vaccine status: Completed vaccines  Qualifies for Shingles Vaccine? Yes   Zostavax completed Yes   Shingrix Completed?: Yes  Screening Tests Health Maintenance  Topic Date Due  . TETANUS/TDAP  Never done  . MAMMOGRAM  11/19/2019  . INFLUENZA VACCINE  Completed  . DEXA SCAN  Completed  . COVID-19 Vaccine  Completed  . PNA vac Low Risk Adult  Completed    Health Maintenance  Health Maintenance Due  Topic Date Due  . TETANUS/TDAP  Never done  . MAMMOGRAM  11/19/2019    Colorectal cancer screening: No longer required.  Mammogram  status: No longer required.  Bone Density status: Completed 09/04/2015. Results reflect: Bone density results: OSTEOPOROSIS. Repeat every 2 years.  Lung Cancer Screening: (Low Dose CT Chest recommended if Age 24-80 years, 30 pack-year currently smoking OR have quit w/in 15years.) does not qualify.   Lung Cancer Screening Referral: No  Additional Screening:  Hepatitis C Screening: does not qualify; Completed Yes   Vision Screening: Recommended annual ophthalmology exams for early detection of glaucoma and other disorders of the eye. Is the patient up to date with their  annual eye exam?  Yes  Who is the provider or what is the name of the office in which the patient attends annual eye exams? If pt is not established with a provider, would they like to be referred to a provider to establish care? No .   Dental Screening: Recommended annual dental exams for proper oral hygiene  Community Resource Referral / Chronic Care Management: CRR required this visit?  No   CCM required this visit?  No      Plan:    - Tdap vaccine  I have personally reviewed and noted the following in the patient's chart:   . Medical and social history . Use of alcohol, tobacco or illicit drugs  . Current medications and supplements . Functional ability and status . Nutritional status . Physical activity . Advanced directives . List of other physicians . Hospitalizations, surgeries, and ER visits in previous 12 months . Vitals . Screenings to include cognitive, depression, and falls . Referrals and appointments  In addition, I have reviewed and discussed with patient certain preventive protocols, quality metrics, and best practice recommendations. A written personalized care plan for preventive services as well as general preventive health recommendations were provided to patient.     Sandrea Hughs, NP   08/25/2020   Nurse Notes: Advised to get Tdap vaccine at her pharmacy

## 2020-08-26 ENCOUNTER — Other Ambulatory Visit: Payer: Self-pay | Admitting: Internal Medicine

## 2020-09-12 DIAGNOSIS — H3581 Retinal edema: Secondary | ICD-10-CM | POA: Diagnosis not present

## 2020-09-12 DIAGNOSIS — Z961 Presence of intraocular lens: Secondary | ICD-10-CM | POA: Insufficient documentation

## 2020-09-12 DIAGNOSIS — H1820 Unspecified corneal edema: Secondary | ICD-10-CM | POA: Insufficient documentation

## 2020-09-12 DIAGNOSIS — H401124 Primary open-angle glaucoma, left eye, indeterminate stage: Secondary | ICD-10-CM | POA: Diagnosis not present

## 2020-09-12 DIAGNOSIS — H401113 Primary open-angle glaucoma, right eye, severe stage: Secondary | ICD-10-CM | POA: Diagnosis not present

## 2020-09-20 DIAGNOSIS — H401124 Primary open-angle glaucoma, left eye, indeterminate stage: Secondary | ICD-10-CM | POA: Diagnosis not present

## 2020-09-20 DIAGNOSIS — Z961 Presence of intraocular lens: Secondary | ICD-10-CM | POA: Diagnosis not present

## 2020-09-20 DIAGNOSIS — H401113 Primary open-angle glaucoma, right eye, severe stage: Secondary | ICD-10-CM | POA: Diagnosis not present

## 2020-09-20 DIAGNOSIS — H02403 Unspecified ptosis of bilateral eyelids: Secondary | ICD-10-CM | POA: Diagnosis not present

## 2020-09-20 DIAGNOSIS — H1820 Unspecified corneal edema: Secondary | ICD-10-CM | POA: Diagnosis not present

## 2020-10-05 ENCOUNTER — Other Ambulatory Visit: Payer: Self-pay

## 2020-10-05 ENCOUNTER — Encounter: Payer: Self-pay | Admitting: Internal Medicine

## 2020-10-05 ENCOUNTER — Other Ambulatory Visit: Payer: Self-pay | Admitting: Internal Medicine

## 2020-10-05 ENCOUNTER — Ambulatory Visit (INDEPENDENT_AMBULATORY_CARE_PROVIDER_SITE_OTHER): Payer: Medicare Other | Admitting: Internal Medicine

## 2020-10-05 VITALS — BP 138/86 | HR 67 | Temp 97.5°F | Ht 62.0 in | Wt 133.1 lb

## 2020-10-05 DIAGNOSIS — I10 Essential (primary) hypertension: Secondary | ICD-10-CM

## 2020-10-05 DIAGNOSIS — E785 Hyperlipidemia, unspecified: Secondary | ICD-10-CM | POA: Diagnosis not present

## 2020-10-05 DIAGNOSIS — F0391 Unspecified dementia with behavioral disturbance: Secondary | ICD-10-CM | POA: Diagnosis not present

## 2020-10-05 DIAGNOSIS — K5904 Chronic idiopathic constipation: Secondary | ICD-10-CM

## 2020-10-05 DIAGNOSIS — R29898 Other symptoms and signs involving the musculoskeletal system: Secondary | ICD-10-CM

## 2020-10-05 DIAGNOSIS — F0392 Unspecified dementia, unspecified severity, with psychotic disturbance: Secondary | ICD-10-CM

## 2020-10-05 DIAGNOSIS — H3530 Unspecified macular degeneration: Secondary | ICD-10-CM

## 2020-10-05 DIAGNOSIS — S0592XS Unspecified injury of left eye and orbit, sequela: Secondary | ICD-10-CM | POA: Diagnosis not present

## 2020-10-05 DIAGNOSIS — R634 Abnormal weight loss: Secondary | ICD-10-CM

## 2020-10-05 DIAGNOSIS — D229 Melanocytic nevi, unspecified: Secondary | ICD-10-CM

## 2020-10-05 MED ORDER — SENNOSIDES-DOCUSATE SODIUM 8.6-50 MG PO TABS: 2.0000 | ORAL_TABLET | Freq: Every day | ORAL | 3 refills | Status: DC

## 2020-10-05 NOTE — Telephone Encounter (Signed)
rx sent to pharmacy by e-script  

## 2020-10-05 NOTE — Telephone Encounter (Signed)
High risk or very high risk warning populated when attempting to refill medication. RX request sent to PCP for review and approval if warranted.   

## 2020-10-05 NOTE — Patient Instructions (Signed)
It's ok to drink a full ensure. Also I recommend a snack between meals if you are only eating twice--apple with peanut butter might be a good one for you.

## 2020-10-05 NOTE — Progress Notes (Signed)
Location:  [facility] clinic Provider:  Shailene Demonbreun [name], D.O., C.M.D.  Code Status: DNR Goals of Care:  Advanced Directives 10/05/2020  Does Patient Have a Medical Advance Directive? Yes  Type of Advance Directive -  Does patient want to make changes to medical advance directive? No - Patient declined  Copy of [place] in Chart? -  Would patient like information on creating a medical advance directive? -  Pre-existing out of facility DNR order (yellow form or pink MOST form) -     Chief Complaint  Patient presents with  . Medical Management of Chronic Issues    4 month follow up   . Acute Visit    Mole on belly has grown since last time     HPI: Patient is a 86 y.o. female seen today for medical management of chronic diseases.    She had poked herself in her left eye b/c she poked herself in the eye.  Her vision was affected.  She thinks her hemorrhoids are gone, but once in a while, she feels them.  Her stool softener is expired (colace) and it does not work that well.    Has a mole on her abdomen that has grown in size--dark brown in suprapubic area--irregular borders.  She has quit getting mammograms at 89.    She's no longer having swelling of her ankles and not using her stockings anymore.    BP ok today.    She's lost 5 lbs.  Says appetite is somewhat different.  Many days, she no longer eats three meals.  She has two meals.  Does not eat snacks.    Her bday was yesterday:  Family baked her a pie, then other junk food.  She got a lot of calls from church members.    Past Medical History:  Diagnosis Date  . Anxiety   . Asthma   . Benign essential hypertension   . Bradycardia, drug induced 06/11/2016  . Edema extremities   . GERD (gastroesophageal reflux disease)   . Glaucoma   . Macular degeneration    Per patient   . Osteoarthritis   . PVD (peripheral vascular disease) ([place])   . Seasonal allergies   . Stroke [facility])     Past Surgical  History:  Procedure Laterality Date  . ABDOMINAL HYSTERECTOMY  1981  . TONSILLECTOMY  1953    Allergies  Allergen Reactions  . Aspirin Other (See Comments)    sweats  . Codeine Other (See Comments)  . Iodine Other (See Comments)    Pt is not aware of this allergy, does not recall much info.  . Prednisone Other (See Comments)    sweats  . Lyrica [Pregabalin]     Wobbly, "more than sleepy"  . Benzonatate Itching and Rash  . Sulfa Antibiotics Other (See Comments)    Doesn't remember    Outpatient Encounter Medications as of 10/05/2020  Medication Sig  . acetaminophen (TYLENOL) 500 MG tablet Take 500 mg by mouth every 6 (six) hours as needed for mild pain or moderate pain.   Marland Kitchen albuterol (PROVENTIL HFA;VENTOLIN HFA) 108 (90 Base) MCG/ACT inhaler Inhale 2 puffs into the lungs every 6 (six) hours as needed for wheezing.  Marland Kitchen alum hydroxide-mag trisilicate (GAVISCON) 30-94 MG CHEW chewable tablet Chew 2 tablets by mouth 2 (two) times daily.  Marland Kitchen amLODipine (NORVASC) 5 MG tablet Take 1 tablet (5 mg total) by mouth daily.  Marland Kitchen atorvastatin (LIPITOR) 10 MG tablet TAKE 1 TABLET  BY MOUTH EVERY DAY  . atropine 1 % ophthalmic solution Place 1 drop into the right eye daily.   . B Complex Vitamins (B COMPLEX PO) Take 1 tablet by mouth daily.  . Brinzolamide-Brimonidine 1-0.2 % SUSP Apply 1 drop to eye 3 (three) times daily.  . budesonide-formoterol (SYMBICORT) 80-4.5 MCG/ACT inhaler Inhale 2 puffs into the lungs 2 (two) times daily.  . Cholecalciferol (VITAMIN D3) 2000 units TABS Take 2,000 Units by mouth daily.  . clopidogrel (PLAVIX) 75 MG tablet TAKE 1 TABLET BY MOUTH EVERY DAY  . Coenzyme Q10 (COQ10) 50 MG CAPS Take 50 mg by mouth daily.  Marland Kitchen docusate sodium (COLACE) 100 MG capsule Take 1 capsule (100 mg total) by mouth 2 (two) times daily.  . fluticasone (FLONASE) 50 MCG/ACT nasal spray Place 1 spray into the nose daily.  . hydrocortisone (ANUSOL-HC) 2.5 % rectal cream Place 1 application rectally  daily as needed for hemorrhoids or anal itching.  . Loratadine (CLARITIN PO) Take 5 mg by mouth as needed.  . Menthol, Topical Analgesic, (BIOFREEZE) 4 % GEL Apply 1 application topically 3 (three) times daily. Apply to knee  . Multiple Vitamins-Minerals (PRESERVISION AREDS 2) CAPS Take 1 capsule by mouth 2 (two) times daily.  . Multiple Vitamins-Minerals (SENIOR MULTIVITAMIN PLUS) TABS Take 1 tablet by mouth daily.   . NONFORMULARY OR COMPOUNDED ITEM 1 application 2 (two) times daily as needed. [facility]:  Peripheral Neuropathy Cream - Bupivacaine 1%, Doxepin 3%, Gabapentin 6%, Pentoxifylline 3%, Topiramate 1%, apply 1-2 grams to affected areas 3-4 times daily prn.  . Omega-3 Fatty Acids (FISH OIL) 1200 MG CPDR Take 1,200 mg by mouth daily. Take one tablet by mouth once daily  . pantoprazole (PROTONIX) 40 MG tablet Take 1 tablet (40 mg total) by mouth 2 (two) times daily before a meal.  . prednisoLONE acetate (PRED FORTE) 1 % ophthalmic suspension Place 1 drop into the right eye daily.  Marland Kitchen PREDNISOLONE ACETATE OP Place 1 drop into the right eye 3 (three) times daily.  . risperiDONE (RISPERDAL) 0.5 MG tablet TAKE 1 TABLET BY MOUTH TWICE A DAY  . travoprost, benzalkonium, (TRAVATAN) 0.004 % ophthalmic solution Place 1 drop into the left eye at bedtime.  . TROPICAMIDE OP Place 1 drop into the right eye daily.  . vitamin C (ASCORBIC ACID) 500 MG tablet Take 500 mg by mouth daily.   No facility-administered encounter medications on file as of 10/05/2020.    Review of Systems:  Review of Systems  Constitutional: Negative for chills, fever and malaise/fatigue.  HENT: Negative for congestion and sore throat.   Eyes: Positive for blurred vision.       Drainage left eye  Respiratory: Negative for cough and shortness of breath.   Cardiovascular: Negative for chest pain, palpitations and leg swelling.  Gastrointestinal: Negative for abdominal pain, blood in stool, constipation, diarrhea and  melena.  Genitourinary: Negative for dysuria.  Musculoskeletal: Negative for back pain and falls.  Skin: Negative for itching and rash.       Irregular nevus on abdomen  Neurological: Negative for dizziness and loss of consciousness.  Psychiatric/Behavioral: Positive for memory loss. Negative for depression. The patient is not nervous/anxious and does not have insomnia.     Health Maintenance  Topic Date Due  . TETANUS/TDAP  Never done  . MAMMOGRAM  11/19/2019  . COVID-19 Vaccine (4 - Booster for Pfizer series) 03/21/2021  . INFLUENZA VACCINE  Completed  . DEXA SCAN  Completed  . PNA  vac Low Risk Adult  Completed    Physical Exam: Vitals:   10/05/20 1307  BP: 138/86  Pulse: 67  Temp: (!) 97.5 F (36.4 C)  TempSrc: Temporal  SpO2: 97%  Weight: 133 lb 1.6 oz (60.4 kg)  Height: 5\' 2"  (1.575 m)   Body mass index is 24.34 kg/m. Physical Exam Vitals reviewed.  Constitutional:      Appearance: Normal appearance.  HENT:     Head: Normocephalic and atraumatic.  Eyes:     Comments: Left eye drainage today; poor vision  Cardiovascular:     Rate and Rhythm: Bradycardia present. Rhythm irregular.  Pulmonary:     Effort: Pulmonary effort is normal.     Breath sounds: Normal breath sounds. No wheezing, rhonchi or rales.  Abdominal:     General: Bowel sounds are normal. There is no distension.     Tenderness: There is no abdominal tenderness.  Musculoskeletal:        General: Normal range of motion.     Right lower leg: No edema.     Left lower leg: No edema.  Neurological:     General: No focal deficit present.     Mental Status: She is alert.     Gait: Gait abnormal.     Comments: Walks with rolling walker  Psychiatric:        Mood and Affect: Mood normal.     Labs reviewed: Basic Metabolic Panel: Recent Labs    04/21/20 0458  NA 139  K 3.6  CL 109  CO2 19*  GLUCOSE 150*  BUN 9  CREATININE 0.91  CALCIUM 9.2   Liver Function Tests: No results for  input(s): AST, ALT, ALKPHOS, BILITOT, PROT, ALBUMIN in the last 8760 hours. No results for input(s): LIPASE, AMYLASE in the last 8760 hours. No results for input(s): AMMONIA in the last 8760 hours. CBC: Recent Labs    04/21/20 0458  WBC 10.2  HGB 11.8*  HCT 34.9*  MCV 96.7  PLT 194   Lipid Panel: No results for input(s): CHOL, HDL, LDLCALC, TRIG, CHOLHDL, LDLDIRECT in the last 8760 hours. Lab Results  Component Value Date   HGBA1C 5.6 04/22/2016    Procedures since last visit: No results found.  Assessment/Plan 1. Senile dementia with psychosis (HCC) -ongoing, takes risperdal--historically had paranoid psychosis but ok lately  2. Macular degeneration of left eye, unspecified type -followed closely by retina specialist  3. Weight loss -noted since last time -eating two instead of three meals and not snacking and having only 1/2 ensure so suggested full one and snacks like apple with peanut butter b/w  4. Essential hypertension -bp at goal, cont to monitor - CMP with eGFR([facility]) - CBC with Differential/Platelet - TSH  5. Left eye injury, sequela -seen by ophtho about this already and on special drops  6. Nevus -cont to monitor appearance--irregular size of pencil eraser in suprapubic area  Ordered by video visit wellness visit provider: 7. Hyperlipidemia, unspecified hyperlipidemia type - Lipid panel  8. Weakness of both lower extremities - CMP with eGFR([facility]) - CBC with Differential/Platelet  Labs/tests ordered:   Lab Orders  No laboratory test(s) ordered today  labs released ordered by NP to be done today  Next appt:  4 mos med mgt  Shahrzad Koble L. Shraddha Lebron, D.O. Geriatrics 04/24/2016 [facility] [address], [place] Cell Phone (Mon-Fri 8am-5pm):  [phone] On Call:  [phone] & follow prompts after 5pm & weekends Office Phone:  [phone] Office Fax:  [phone]

## 2020-10-06 LAB — CBC WITH DIFFERENTIAL/PLATELET
Absolute Monocytes: 533 cells/uL (ref 200–950)
Basophils Absolute: 37 cells/uL (ref 0–200)
Basophils Relative: 0.6 %
Eosinophils Absolute: 81 cells/uL (ref 15–500)
Eosinophils Relative: 1.3 %
HCT: 36.1 % (ref 35.0–45.0)
Hemoglobin: 12.1 g/dL (ref 11.7–15.5)
Lymphs Abs: 2151 cells/uL (ref 850–3900)
MCH: 32.2 pg (ref 27.0–33.0)
MCHC: 33.5 g/dL (ref 32.0–36.0)
MCV: 96 fL (ref 80.0–100.0)
MPV: 10.8 fL (ref 7.5–12.5)
Monocytes Relative: 8.6 %
Neutro Abs: 3398 cells/uL (ref 1500–7800)
Neutrophils Relative %: 54.8 %
Platelets: 212 10*3/uL (ref 140–400)
RBC: 3.76 10*6/uL — ABNORMAL LOW (ref 3.80–5.10)
RDW: 13 % (ref 11.0–15.0)
Total Lymphocyte: 34.7 %
WBC: 6.2 10*3/uL (ref 3.8–10.8)

## 2020-10-06 LAB — COMPLETE METABOLIC PANEL WITH GFR
AG Ratio: 1.5 (calc) (ref 1.0–2.5)
ALT: 12 U/L (ref 6–29)
AST: 15 U/L (ref 10–35)
Albumin: 3.8 g/dL (ref 3.6–5.1)
Alkaline phosphatase (APISO): 81 U/L (ref 37–153)
BUN: 12 mg/dL (ref 7–25)
CO2: 24 mmol/L (ref 20–32)
Calcium: 9.1 mg/dL (ref 8.6–10.4)
Chloride: 108 mmol/L (ref 98–110)
Creat: 0.81 mg/dL (ref 0.60–0.88)
GFR, Est African American: 72 mL/min/{1.73_m2} (ref >=60)
GFR, Est Non African American: 62 mL/min/{1.73_m2} (ref >=60)
Globulin: 2.6 g/dL (calc) (ref 1.9–3.7)
Glucose, Bld: 92 mg/dL (ref 65–99)
Potassium: 4 mmol/L (ref 3.5–5.3)
Sodium: 139 mmol/L (ref 135–146)
Total Bilirubin: 0.7 mg/dL (ref 0.2–1.2)
Total Protein: 6.4 g/dL (ref 6.1–8.1)

## 2020-10-06 LAB — LIPID PANEL
Cholesterol: 127 mg/dL (ref <200)
HDL: 64 mg/dL (ref >=50)
LDL Cholesterol (Calc): 50 mg/dL (calc)
Non-HDL Cholesterol (Calc): 63 mg/dL (calc) (ref <130)
Total CHOL/HDL Ratio: 2 (calc) (ref <5.0)
Triglycerides: 57 mg/dL (ref <150)

## 2020-10-06 LAB — TSH: TSH: 1.75 mIU/L (ref 0.40–4.50)

## 2020-10-09 NOTE — Progress Notes (Signed)
All of her labs were great!  :)

## 2020-11-03 ENCOUNTER — Other Ambulatory Visit: Payer: Self-pay

## 2020-11-03 ENCOUNTER — Encounter: Payer: Self-pay | Admitting: Podiatry

## 2020-11-03 ENCOUNTER — Ambulatory Visit (INDEPENDENT_AMBULATORY_CARE_PROVIDER_SITE_OTHER): Payer: Medicare Other | Admitting: Podiatry

## 2020-11-03 DIAGNOSIS — M79674 Pain in right toe(s): Secondary | ICD-10-CM

## 2020-11-03 DIAGNOSIS — G5793 Unspecified mononeuropathy of bilateral lower limbs: Secondary | ICD-10-CM

## 2020-11-03 DIAGNOSIS — B351 Tinea unguium: Secondary | ICD-10-CM | POA: Diagnosis not present

## 2020-11-03 DIAGNOSIS — M79675 Pain in left toe(s): Secondary | ICD-10-CM | POA: Diagnosis not present

## 2020-11-05 NOTE — Progress Notes (Signed)
Subjective: ERCIA CRISAFULLI presents today for follow up of at risk foot care with history of peripheral neuropathy and corn(s) left 4th digit and painful mycotic toenails b/l that are difficult to trim. Pain interferes with ambulation. Aggravating factors include wearing enclosed shoe gear. Pain is relieved with periodic professional debridement.   She relates no new pedal problems on today's visit.   Allergies  Allergen Reactions  . Aspirin Other (See Comments)    sweats  . Codeine Other (See Comments)  . Iodine Other (See Comments)    Pt is not aware of this allergy, does not recall much info.  . Prednisone Other (See Comments)    sweats  . Lyrica [Pregabalin]     Wobbly, "more than sleepy"  . Benzonatate Itching and Rash  . Sulfa Antibiotics Other (See Comments)    Doesn't remember    ROS: Numbness of feet b/l  Objective: There were no vitals filed for this visit.  Pt 87 y.o. year old female  in NAD. AAO x 3.   Vascular Examination:  Capillary fill time to digits <3 seconds b/l. Palpable DP pulses b/l. Palpable PT pulses b/l. Pedal hair absent b/l Skin temperature gradient within normal limits b/l.  Dermatological Examination: Pedal skin with normal turgor, texture and tone bilaterally. No open wounds bilaterally. No interdigital macerations bilaterally. Toenails 1-5 b/l elongated, discolored, dystrophic, thickened, crumbly with subungual debris and tenderness to dorsal palpation. No hyperkeratotic nor porokeratotic lesions present on today's visit.  Musculoskeletal: Normal muscle strength 5/5 to all lower extremity muscle groups bilaterally. No pain crepitus or joint limitation noted with ROM b/l. Hammertoes noted to the L 4th toe. Wearing appropriate fitting shoe gear. Utilizes wheelchair for mobility assistance.  Neurological: Pt has subjective symptoms of neuropathy. Protective sensation intact 5/5 intact bilaterally with 10g monofilament b/l. Vibratory sensation intact  b/l. Proprioception intact bilaterally. Achilles reflex 2+ b/l. Babinski reflex negative b/l.  Assessment: 1. Pain due to onychomycosis of toenails of both feet   2. Neuropathy of both feet    Plan: -No new findings. No new orders. -Toenails 1-5 b/l were debrided in length and girth with sterile nail nippers and dremel without iatrogenic bleeding.  -For neuropathy, continue compounded topical neuropathy cream from [place]. -Patient to continue soft, supportive shoe gear daily. -Patient to report any pedal injuries to medical professional immediately. -Patient/POA to call should there be question/concern in the interim.  Return in about 3 months (around 02/01/2021).

## 2020-11-13 DIAGNOSIS — H401113 Primary open-angle glaucoma, right eye, severe stage: Secondary | ICD-10-CM | POA: Diagnosis not present

## 2020-11-13 DIAGNOSIS — H401124 Primary open-angle glaucoma, left eye, indeterminate stage: Secondary | ICD-10-CM | POA: Diagnosis not present

## 2020-11-17 DIAGNOSIS — J4540 Moderate persistent asthma, uncomplicated: Secondary | ICD-10-CM | POA: Diagnosis not present

## 2020-11-17 DIAGNOSIS — J3089 Other allergic rhinitis: Secondary | ICD-10-CM | POA: Diagnosis not present

## 2020-11-17 DIAGNOSIS — J300 Vasomotor rhinitis: Secondary | ICD-10-CM | POA: Diagnosis not present

## 2020-11-27 ENCOUNTER — Encounter: Payer: Self-pay | Admitting: Internal Medicine

## 2020-12-11 DIAGNOSIS — H401113 Primary open-angle glaucoma, right eye, severe stage: Secondary | ICD-10-CM | POA: Diagnosis not present

## 2020-12-11 DIAGNOSIS — H401124 Primary open-angle glaucoma, left eye, indeterminate stage: Secondary | ICD-10-CM | POA: Diagnosis not present

## 2021-02-09 ENCOUNTER — Encounter: Payer: Self-pay | Admitting: Family

## 2021-02-09 ENCOUNTER — Ambulatory Visit (INDEPENDENT_AMBULATORY_CARE_PROVIDER_SITE_OTHER): Payer: Medicare Other | Admitting: Family

## 2021-02-09 ENCOUNTER — Other Ambulatory Visit: Payer: Self-pay

## 2021-02-09 VITALS — BP 160/98 | HR 82 | Temp 97.7°F | Resp 16 | Ht 62.0 in

## 2021-02-09 DIAGNOSIS — F0391 Unspecified dementia with behavioral disturbance: Secondary | ICD-10-CM | POA: Diagnosis not present

## 2021-02-09 DIAGNOSIS — R2681 Unsteadiness on feet: Secondary | ICD-10-CM | POA: Diagnosis not present

## 2021-02-09 DIAGNOSIS — E785 Hyperlipidemia, unspecified: Secondary | ICD-10-CM

## 2021-02-09 DIAGNOSIS — M25551 Pain in right hip: Secondary | ICD-10-CM

## 2021-02-09 DIAGNOSIS — R410 Disorientation, unspecified: Secondary | ICD-10-CM | POA: Diagnosis not present

## 2021-02-09 DIAGNOSIS — Z23 Encounter for immunization: Secondary | ICD-10-CM | POA: Diagnosis not present

## 2021-02-09 DIAGNOSIS — F0392 Unspecified dementia, unspecified severity, with psychotic disturbance: Secondary | ICD-10-CM

## 2021-02-09 DIAGNOSIS — Y92009 Unspecified place in unspecified non-institutional (private) residence as the place of occurrence of the external cause: Secondary | ICD-10-CM

## 2021-02-09 DIAGNOSIS — I10 Essential (primary) hypertension: Secondary | ICD-10-CM

## 2021-02-09 DIAGNOSIS — W19XXXA Unspecified fall, initial encounter: Secondary | ICD-10-CM

## 2021-02-09 LAB — POCT URINALYSIS DIPSTICK
Bilirubin, UA: NEGATIVE
Glucose, UA: NEGATIVE
Nitrite, UA: NEGATIVE
Protein, UA: POSITIVE — AB
Spec Grav, UA: <=1.005 — AB (ref 1.010–1.025)
Urobilinogen, UA: NEGATIVE E.U./dL — AB
pH, UA: 8.5 — AB (ref 5.0–8.0)

## 2021-02-09 NOTE — Progress Notes (Signed)
Provider: Marlowe Sax FNP-C  Tanaiya Kolarik, Nelda Bucks, NP  Patient Care Team: Ronae Noell, Nelda Bucks, NP as PCP - General (Family Medicine) Sueanne Margarita, MD as PCP - Cardiology (Cardiology) Trula Slade, DPM as Consulting Physician (Podiatry) Bond, Tracie Harrier, MD as Referring Physician (Ophthalmology)  Extended Emergency Contact Information Primary Emergency Contact: Skidmore,Carl          [place] [address] [place] Phone: [phone] Mobile Phone: [phone] Relation: Son Secondary Emergency Contact: Stormy Fabian [place] Phone: [phone] Mobile Phone: [phone] Relation: Daughter  Code Status:  DNR Goals of care: Advanced Directive information Advanced Directives 02/09/2021  Does Patient Have a Medical Advance Directive? Yes  Type of Advance Directive Out of facility DNR (pink MOST or yellow form)  Does patient want to make changes to medical advance directive? No - Patient declined  Copy of [place] in Chart? -  Would patient like information on creating a medical advance directive? -  Pre-existing out of facility DNR order (yellow form or pink MOST form) -     Chief Complaint  Patient presents with  . Immunizations    Discuss the need for Tetanus Vaccine.   . Concern    Moderate Fall Risk. Recent fall last night 02/08/2021. Patient hurt right hip.  . Acute Visit    HPI:  Pt is a 87 y.o. female seen today for an acute visit for evaluation of fall episode last night.states was walking to the bathroom without using her walker.She is here with son who states usually patient uses the bedside commode at night.  She complains of right hip pain from landing on the right hip.Has taking tylenol with some relief.  Has had no fever,chills or symptoms of urinary tract infection.  No home blood pressure readings for evaluation. Son states patient does own sponge bath after assisting with set up but requires  assistance with his medication and melas.  She is due for Tdap vaccine. Appetite described as fair.No weight loss    Past Medical History:  Diagnosis Date  . Anxiety   . Asthma   . Benign essential hypertension   . Bradycardia, drug induced 06/11/2016  . Edema extremities   . GERD (gastroesophageal reflux disease)   . Glaucoma   . Macular degeneration    Per patient   . Osteoarthritis   . PVD (peripheral vascular disease) ([place])   . Seasonal allergies   . Stroke [facility])    Past Surgical History:  Procedure Laterality Date  . ABDOMINAL HYSTERECTOMY  1981  . TONSILLECTOMY  1953    Allergies  Allergen Reactions  . Aspirin Other (See Comments)    sweats  . Codeine Other (See Comments)  . Iodine Other (See Comments)    Pt is not aware of this allergy, does not recall much info.  . Prednisone Other (See Comments)    sweats  . Lyrica [Pregabalin]     Wobbly, "more than sleepy"  . Benzonatate Itching and Rash  . Sulfa Antibiotics Other (See Comments)    Doesn't remember    Outpatient Encounter Medications as of 02/09/2021  Medication Sig  . acetaminophen (TYLENOL) 500 MG tablet Take 500 mg by mouth every 6 (six) hours as needed for mild pain or moderate pain.   Marland Kitchen albuterol (PROVENTIL HFA;VENTOLIN HFA) 108 (90 Base) MCG/ACT inhaler Inhale 2 puffs into the lungs every 6 (six) hours as needed for wheezing.  Marland Kitchen alum hydroxide-mag trisilicate (GAVISCON) 22-63 MG CHEW  chewable tablet Chew 2 tablets by mouth 2 (two) times daily.  Marland Kitchen amLODipine (NORVASC) 5 MG tablet Take 1 tablet (5 mg total) by mouth daily.  Marland Kitchen atorvastatin (LIPITOR) 10 MG tablet TAKE 1 TABLET BY MOUTH EVERY DAY  . atropine 1 % ophthalmic solution Place 1 drop into the right eye daily.   . B Complex Vitamins (B COMPLEX PO) Take 1 tablet by mouth daily.  . Brinzolamide-Brimonidine 1-0.2 % SUSP Apply 1 drop to eye 3 (three) times daily.  . budesonide-formoterol (SYMBICORT) 80-4.5 MCG/ACT inhaler Inhale 2 puffs into the  lungs 2 (two) times daily.  . Cholecalciferol (VITAMIN D3) 2000 units TABS Take 2,000 Units by mouth daily.  . clopidogrel (PLAVIX) 75 MG tablet TAKE 1 TABLET BY MOUTH EVERY DAY  . Coenzyme Q10 (COQ10) 50 MG CAPS Take 50 mg by mouth daily.  Marland Kitchen docusate sodium (COLACE) 100 MG capsule TAKE 1 CAPSULE BY MOUTH TWICE A DAY  . fluticasone (FLONASE) 50 MCG/ACT nasal spray Place 1 spray into the nose daily.  . hydrocortisone (ANUSOL-HC) 2.5 % rectal cream Place 1 application rectally daily as needed for hemorrhoids or anal itching.  . Loratadine (CLARITIN PO) Take 5 mg by mouth as needed.  . Menthol, Topical Analgesic, (BIOFREEZE) 4 % GEL Apply 1 application topically 3 (three) times daily. Apply to knee  . Multiple Vitamins-Minerals (PRESERVISION AREDS 2) CAPS Take 1 capsule by mouth 2 (two) times daily.  . Multiple Vitamins-Minerals (SENIOR MULTIVITAMIN PLUS) TABS Take 1 tablet by mouth daily.   . NONFORMULARY OR COMPOUNDED ITEM 1 application 2 (two) times daily as needed. [facility]:  Peripheral Neuropathy Cream - Bupivacaine 1%, Doxepin 3%, Gabapentin 6%, Pentoxifylline 3%, Topiramate 1%, apply 1-2 grams to affected areas 3-4 times daily prn.  . Omega-3 Fatty Acids (FISH OIL) 1200 MG CPDR Take 1,200 mg by mouth daily. Take one tablet by mouth once daily  . pantoprazole (PROTONIX) 40 MG tablet Take 1 tablet (40 mg total) by mouth 2 (two) times daily before a meal.  . prednisoLONE acetate (PRED FORTE) 1 % ophthalmic suspension Place 1 drop into the right eye daily.  Marland Kitchen PREDNISOLONE ACETATE OP Place 1 drop into the right eye 3 (three) times daily.  . risperiDONE (RISPERDAL) 0.5 MG tablet TAKE 1 TABLET BY MOUTH TWICE A DAY  . senna-docusate (SENOKOT S) 8.6-50 MG tablet Take 2 tablets by mouth daily.  . sodium chloride (MURO 128) 5 % ophthalmic solution Place 1 drop into the left eye 4 times daily.  . TRAVATAN Z 0.004 % SOLN ophthalmic solution   . TROPICAMIDE OP Place 1 drop into the right eye  daily.  . vitamin C (ASCORBIC ACID) 500 MG tablet Take 500 mg by mouth daily.   No facility-administered encounter medications on file as of 02/09/2021.    Review of Systems  Unable to perform ROS: Dementia (additional information provided by son )  Constitutional: Negative for appetite change, chills, fatigue, fever and unexpected weight change.  HENT: Negative for congestion, dental problem, ear discharge, ear pain, facial swelling, hearing loss, nosebleeds, postnasal drip, rhinorrhea, sinus pressure, sinus pain, sneezing, sore throat, tinnitus and trouble swallowing.   Eyes: Positive for visual disturbance. Negative for pain, discharge, redness and itching.  Respiratory: Negative for cough, chest tightness, shortness of breath and wheezing.   Cardiovascular: Negative for chest pain, palpitations and leg swelling.  Gastrointestinal: Negative for abdominal distention, abdominal pain, blood in stool, constipation, diarrhea, nausea and vomiting.  Endocrine: Negative for cold intolerance, heat  intolerance, polydipsia, polyphagia and polyuria.  Genitourinary: Negative for difficulty urinating, dysuria, flank pain, frequency and urgency.  Musculoskeletal: Positive for arthralgias and gait problem. Negative for back pain, joint swelling, myalgias, neck pain and neck stiffness.       Right hip pain   Skin: Negative for color change, pallor, rash and wound.  Neurological: Negative for dizziness, syncope, speech difficulty, weakness, light-headedness, numbness and headaches.  Hematological: Does not bruise/bleed easily.  Psychiatric/Behavioral: Positive for confusion. Negative for agitation, behavioral problems, hallucinations, self-injury, sleep disturbance and suicidal ideas. The patient is not nervous/anxious.        Increased confusion per son     Immunization History  Administered Date(s) Administered  . Fluad Quad(high Dose 65+) 06/04/2019, 07/21/2020  . Influenza, High Dose Seasonal PF  10/19/2013, 08/02/2014, 08/11/2015, 11/22/2016, 07/16/2017, 11/21/2017, 07/20/2018, 11/19/2018, 11/18/2019, 11/17/2020  . Influenza,inj,Quad PF,6+ Mos 06/11/2013, 06/30/2014, 07/17/2015, 06/27/2016  . Influenza-Unspecified 07/07/2012  . PFIZER(Purple Top)SARS-COV-2 Vaccination 11/20/2019, 12/13/2019, 09/20/2020, 11/17/2020  . Pneumococcal Conjugate-13 11/07/2014  . Pneumococcal Polysaccharide-23 10/08/2007, 08/11/2015, 11/22/2016, 11/21/2017, 11/19/2018, 11/18/2019, 11/17/2020  . Zoster Recombinat (Shingrix) 07/31/2017, 12/09/2017   Pertinent  Health Maintenance Due  Topic Date Due  . INFLUENZA VACCINE  05/07/2021  . DEXA SCAN  Completed  . PNA vac Low Risk Adult  Completed   Fall Risk  02/09/2021 10/05/2020 08/25/2020 06/05/2020 05/02/2020  Falls in the past year? 1 0 1 1 1   Number falls in past yr: 0 0 0 0 1  Injury with Fall? 1 0 0 1 0   Functional Status Survey:    Vitals:   02/09/21 1306  BP: (!) 160/98  Pulse: 82  Resp: 16  Temp: 97.7 F (36.5 C)  SpO2: 97%  Height: 5' 2"  (1.575 m)   Body mass index is 24.34 kg/m. Physical Exam Vitals reviewed.  Constitutional:      General: She is not in acute distress.    Appearance: Normal appearance. She is normal weight. She is not ill-appearing or diaphoretic.  HENT:     Head: Normocephalic.     Right Ear: Tympanic membrane, ear canal and external ear normal. There is no impacted cerumen.     Left Ear: Tympanic membrane, ear canal and external ear normal. There is no impacted cerumen.     Nose: Nose normal. No congestion or rhinorrhea.     Mouth/Throat:     Mouth: Mucous membranes are moist.     Pharynx: Oropharynx is clear. No oropharyngeal exudate or posterior oropharyngeal erythema.  Eyes:     General: No scleral icterus.       Right eye: No discharge.        Left eye: No discharge.     Extraocular Movements: Extraocular movements intact.     Conjunctiva/sclera: Conjunctivae normal.     Pupils: Pupils are equal, round,  and reactive to light.     Comments: Corrective lens in place poor vision   Neck:     Vascular: No carotid bruit.  Cardiovascular:     Rate and Rhythm: Normal rate. Rhythm irregular.     Pulses: Normal pulses.     Heart sounds: Normal heart sounds. No murmur heard. No friction rub. No gallop.   Pulmonary:     Effort: Pulmonary effort is normal. No respiratory distress.     Breath sounds: Normal breath sounds. No wheezing, rhonchi or rales.  Chest:     Chest wall: No tenderness.  Abdominal:     General: Bowel sounds are normal. There  is no distension.     Palpations: Abdomen is soft. There is no mass.     Tenderness: There is no abdominal tenderness. There is no right CVA tenderness, left CVA tenderness, guarding or rebound.  Musculoskeletal:        General: No swelling.     Cervical back: Normal range of motion. No rigidity or tenderness.     Right hip: Tenderness present. No crepitus. Normal range of motion. Normal strength.     Left hip: No tenderness. Normal range of motion. Normal strength.     Right lower leg: No edema.     Left lower leg: No edema.     Comments: Unsteady gait on wheelchair during visit brought in walker. No bruises noted SI joint tender to deep palpation   Lymphadenopathy:     Cervical: No cervical adenopathy.  Skin:    General: Skin is warm and dry.     Coloration: Skin is not pale.     Findings: No bruising, erythema, lesion or rash.  Neurological:     Mental Status: She is alert. Mental status is at baseline.     Cranial Nerves: No cranial nerve deficit.     Sensory: No sensory deficit.     Motor: No weakness.     Coordination: Coordination normal.     Gait: Gait abnormal.  Psychiatric:        Mood and Affect: Mood normal.        Speech: Speech normal.        Behavior: Behavior normal.        Thought Content: Thought content normal.        Cognition and Memory: Memory is impaired.        Judgment: Judgment normal.     Labs reviewed: Recent  Labs    04/21/20 0458 10/05/20 1407  NA 139 139  K 3.6 4.0  CL 109 108  CO2 19* 24  GLUCOSE 150* 92  BUN 9 12  CREATININE 0.91 0.81  CALCIUM 9.2 9.1   Recent Labs    10/05/20 1407  AST 15  ALT 12  BILITOT 0.7  PROT 6.4   Recent Labs    04/21/20 0458 10/05/20 1407  WBC 10.2 6.2  NEUTROABS  --  3,398  HGB 11.8* 12.1  HCT 34.9* 36.1  MCV 96.7 96.0  PLT 194 212   Lab Results  Component Value Date   TSH 1.75 10/05/2020   Lab Results  Component Value Date   HGBA1C 5.6 04/22/2016   Lab Results  Component Value Date   CHOL 127 10/05/2020   HDL 64 10/05/2020   LDLCALC 50 10/05/2020   TRIG 57 10/05/2020   CHOLHDL 2.0 10/05/2020    Significant Diagnostic Results in last 30 days:  No results found.  Assessment/Plan 1. Unsteady gait Encouraged to use Rolling walker all the time with ambulation - Ambulatory referral to [facility] for gait stability,ROM,EXercise and muscle strengthening.  - CBC with Differential/Platelet - BMP with eGFR([facility]) - DG HIP UNILAT W OR W/O PELVIS 2-3 VIEWS RIGHT; Future  2. Fall at home, initial encounter [facility] last night walking without walker.Increased confusion per POA usually uses BSC but walked to the bathroom.  - will rule out acte and other metabolic etiologies - Ambulatory referral to [place] as above  - CBC with Differential/Platelet - BMP with eGFR([facility]) - Urine Culture - DG HIP UNILAT W OR W/O PELVIS 2-3 VIEWS RIGHT; Future  3. Confusion Has worsen per POA  -  Ambulatory referral to [place] with Differential/Platelet - BMP with eGFR([facility]) - Urine Culture - POC Urinalysis Dipstick  4. Senile dementia with psychosis ([place]) No worsening behavioral issues but per son seems to be more confused - Ambulatory referral to [place]  5. Acute right hip pain Status post fall on right side.SI joijt tender to palpation.No bruise noted on exam  - DG HIP UNILAT W OR W/O PELVIS 2-3 VIEWS RIGHT; Future -  continue on Extra strength Tylenol - Advised to get right hip X-ray at [place] at [facility] then will call you with results.   6. Essential hypertension B/p high on arrival.suspected due to pain  - continue on amlodipine, - CBC with Differential/Platelet; Future - CMP with eGFR([facility]); Future - TSH; Future  7. Need for Tdap vaccination Advised to get her Tdap vaccine at the pharmacy.   8. Hyperlipidemia, unspecified hyperlipidemia type LDL at goal  - continue on Omega- 3  - Lipid panel; Future  Family/ staff Communication: Reviewed plan of care with patient and son verbalized understanding   Labs/tests ordered:  - DG HIP UNILAT W OR W/O PELVIS 2-3 VIEWS RIGHT; Future  Next Appointment: 6 months for medical management of chronic issues.Fasting Labs prior to visit.    Sandrea Hughs, NP

## 2021-02-09 NOTE — Patient Instructions (Signed)
- -   Please get right hip X-ray at [place] at [facility] then will call you with results.  Labs drawn today will call you with results  - continue with Tylenol for pain   - Please get Tetanus vaccine at your pharmacy

## 2021-02-11 LAB — CBC WITH DIFFERENTIAL/PLATELET
Absolute Monocytes: 429 cells/uL (ref 200–950)
Basophils Absolute: 23 cells/uL (ref 0–200)
Basophils Relative: 0.3 %
Eosinophils Absolute: 8 cells/uL — ABNORMAL LOW (ref 15–500)
Eosinophils Relative: 0.1 %
HCT: 38.1 % (ref 35.0–45.0)
Hemoglobin: 12.8 g/dL (ref 11.7–15.5)
Lymphs Abs: 725 cells/uL — ABNORMAL LOW (ref 850–3900)
MCH: 32.2 pg (ref 27.0–33.0)
MCHC: 33.6 g/dL (ref 32.0–36.0)
MCV: 96 fL (ref 80.0–100.0)
MPV: 10.8 fL (ref 7.5–12.5)
Monocytes Relative: 5.5 %
Neutro Abs: 6614 cells/uL (ref 1500–7800)
Neutrophils Relative %: 84.8 %
Platelets: 186 10*3/uL (ref 140–400)
RBC: 3.97 10*6/uL (ref 3.80–5.10)
RDW: 12.9 % (ref 11.0–15.0)
Total Lymphocyte: 9.3 %
WBC: 7.8 10*3/uL (ref 3.8–10.8)

## 2021-02-11 LAB — URINE CULTURE
MICRO NUMBER:: 11861845
SPECIMEN QUALITY:: ADEQUATE

## 2021-02-11 LAB — BASIC METABOLIC PANEL WITH GFR
BUN: 14 mg/dL (ref 7–25)
CO2: 24 mmol/L (ref 20–32)
Calcium: 9.8 mg/dL (ref 8.6–10.4)
Chloride: 104 mmol/L (ref 98–110)
Creat: 0.83 mg/dL (ref 0.60–0.88)
GFR, Est African American: 70 mL/min/{1.73_m2} (ref >=60)
GFR, Est Non African American: 60 mL/min/{1.73_m2} (ref >=60)
Glucose, Bld: 119 mg/dL (ref 65–139)
Potassium: 3.9 mmol/L (ref 3.5–5.3)
Sodium: 139 mmol/L (ref 135–146)

## 2021-02-12 ENCOUNTER — Ambulatory Visit
Admission: RE | Admit: 2021-02-12 | Discharge: 2021-02-12 | Disposition: A | Discharge status: Home / Self Care | Payer: Medicare Other | Source: Ambulatory Visit | Attending: Family | Admitting: Family

## 2021-02-12 DIAGNOSIS — M1611 Unilateral primary osteoarthritis, right hip: Secondary | ICD-10-CM | POA: Diagnosis not present

## 2021-02-12 DIAGNOSIS — Y92009 Unspecified place in unspecified non-institutional (private) residence as the place of occurrence of the external cause: Secondary | ICD-10-CM

## 2021-02-12 DIAGNOSIS — R2681 Unsteadiness on feet: Secondary | ICD-10-CM

## 2021-02-12 DIAGNOSIS — M25551 Pain in right hip: Secondary | ICD-10-CM

## 2021-02-12 DIAGNOSIS — W19XXXA Unspecified fall, initial encounter: Secondary | ICD-10-CM

## 2021-02-12 DIAGNOSIS — M47816 Spondylosis without myelopathy or radiculopathy, lumbar region: Secondary | ICD-10-CM | POA: Diagnosis not present

## 2021-02-14 DIAGNOSIS — E785 Hyperlipidemia, unspecified: Secondary | ICD-10-CM | POA: Diagnosis not present

## 2021-02-14 DIAGNOSIS — R32 Unspecified urinary incontinence: Secondary | ICD-10-CM | POA: Diagnosis not present

## 2021-02-14 DIAGNOSIS — H53122 Transient visual loss, left eye: Secondary | ICD-10-CM | POA: Diagnosis not present

## 2021-02-14 DIAGNOSIS — M25551 Pain in right hip: Secondary | ICD-10-CM | POA: Diagnosis not present

## 2021-02-14 DIAGNOSIS — K219 Gastro-esophageal reflux disease without esophagitis: Secondary | ICD-10-CM | POA: Diagnosis not present

## 2021-02-14 DIAGNOSIS — I10 Essential (primary) hypertension: Secondary | ICD-10-CM | POA: Diagnosis not present

## 2021-02-14 DIAGNOSIS — G5793 Unspecified mononeuropathy of bilateral lower limbs: Secondary | ICD-10-CM | POA: Diagnosis not present

## 2021-02-14 DIAGNOSIS — M1710 Unilateral primary osteoarthritis, unspecified knee: Secondary | ICD-10-CM | POA: Diagnosis not present

## 2021-02-14 DIAGNOSIS — Z7902 Long term (current) use of antithrombotics/antiplatelets: Secondary | ICD-10-CM | POA: Diagnosis not present

## 2021-02-14 DIAGNOSIS — H3530 Unspecified macular degeneration: Secondary | ICD-10-CM | POA: Diagnosis not present

## 2021-02-14 DIAGNOSIS — H401113 Primary open-angle glaucoma, right eye, severe stage: Secondary | ICD-10-CM | POA: Diagnosis not present

## 2021-02-14 DIAGNOSIS — I739 Peripheral vascular disease, unspecified: Secondary | ICD-10-CM | POA: Diagnosis not present

## 2021-02-14 DIAGNOSIS — J4520 Mild intermittent asthma, uncomplicated: Secondary | ICD-10-CM | POA: Diagnosis not present

## 2021-02-14 DIAGNOSIS — F419 Anxiety disorder, unspecified: Secondary | ICD-10-CM | POA: Diagnosis not present

## 2021-02-14 DIAGNOSIS — Z8673 Personal history of transient ischemic attack (TIA), and cerebral infarction without residual deficits: Secondary | ICD-10-CM | POA: Diagnosis not present

## 2021-02-14 DIAGNOSIS — F0391 Unspecified dementia with behavioral disturbance: Secondary | ICD-10-CM | POA: Diagnosis not present

## 2021-02-14 DIAGNOSIS — M1990 Unspecified osteoarthritis, unspecified site: Secondary | ICD-10-CM | POA: Diagnosis not present

## 2021-02-14 DIAGNOSIS — Z9181 History of falling: Secondary | ICD-10-CM | POA: Diagnosis not present

## 2021-02-19 ENCOUNTER — Ambulatory Visit: Payer: Medicare Other | Admitting: Family

## 2021-02-19 ENCOUNTER — Ambulatory Visit: Payer: Medicare Other | Admitting: Internal Medicine

## 2021-02-22 ENCOUNTER — Telehealth: Payer: Self-pay | Admitting: *Deleted

## 2021-02-22 NOTE — Telephone Encounter (Signed)
[name] [name] with [facility] called and left message on Clinical Intake requesting verbal orders for PT.   Called and LMOM with Verbal orders for PT.

## 2021-03-06 ENCOUNTER — Ambulatory Visit: Payer: Medicare Other | Admitting: Podiatry

## 2021-03-06 DIAGNOSIS — M1990 Unspecified osteoarthritis, unspecified site: Secondary | ICD-10-CM | POA: Diagnosis not present

## 2021-03-06 DIAGNOSIS — M1710 Unilateral primary osteoarthritis, unspecified knee: Secondary | ICD-10-CM | POA: Diagnosis not present

## 2021-03-06 DIAGNOSIS — I739 Peripheral vascular disease, unspecified: Secondary | ICD-10-CM | POA: Diagnosis not present

## 2021-03-06 DIAGNOSIS — E785 Hyperlipidemia, unspecified: Secondary | ICD-10-CM | POA: Diagnosis not present

## 2021-03-06 DIAGNOSIS — G5793 Unspecified mononeuropathy of bilateral lower limbs: Secondary | ICD-10-CM | POA: Diagnosis not present

## 2021-03-06 DIAGNOSIS — I10 Essential (primary) hypertension: Secondary | ICD-10-CM | POA: Diagnosis not present

## 2021-03-09 DIAGNOSIS — G5793 Unspecified mononeuropathy of bilateral lower limbs: Secondary | ICD-10-CM | POA: Diagnosis not present

## 2021-03-09 DIAGNOSIS — M1710 Unilateral primary osteoarthritis, unspecified knee: Secondary | ICD-10-CM | POA: Diagnosis not present

## 2021-03-09 DIAGNOSIS — I10 Essential (primary) hypertension: Secondary | ICD-10-CM | POA: Diagnosis not present

## 2021-03-09 DIAGNOSIS — E785 Hyperlipidemia, unspecified: Secondary | ICD-10-CM | POA: Diagnosis not present

## 2021-03-09 DIAGNOSIS — I739 Peripheral vascular disease, unspecified: Secondary | ICD-10-CM | POA: Diagnosis not present

## 2021-03-09 DIAGNOSIS — M1990 Unspecified osteoarthritis, unspecified site: Secondary | ICD-10-CM | POA: Diagnosis not present

## 2021-03-12 DIAGNOSIS — E785 Hyperlipidemia, unspecified: Secondary | ICD-10-CM | POA: Diagnosis not present

## 2021-03-12 DIAGNOSIS — G5793 Unspecified mononeuropathy of bilateral lower limbs: Secondary | ICD-10-CM | POA: Diagnosis not present

## 2021-03-12 DIAGNOSIS — M1990 Unspecified osteoarthritis, unspecified site: Secondary | ICD-10-CM | POA: Diagnosis not present

## 2021-03-12 DIAGNOSIS — M1710 Unilateral primary osteoarthritis, unspecified knee: Secondary | ICD-10-CM | POA: Diagnosis not present

## 2021-03-12 DIAGNOSIS — I10 Essential (primary) hypertension: Secondary | ICD-10-CM | POA: Diagnosis not present

## 2021-03-12 DIAGNOSIS — I739 Peripheral vascular disease, unspecified: Secondary | ICD-10-CM | POA: Diagnosis not present

## 2021-03-14 ENCOUNTER — Other Ambulatory Visit: Payer: Self-pay | Admitting: *Deleted

## 2021-03-14 ENCOUNTER — Telehealth: Payer: Self-pay | Admitting: *Deleted

## 2021-03-14 MED ORDER — CLOPIDOGREL BISULFATE 75 MG PO TABS: 1.0000 | ORAL_TABLET | Freq: Every day | ORAL | 3 refills | Status: DC

## 2021-03-14 NOTE — Telephone Encounter (Signed)
Eric with [facility] called requesting verbal orders to extend PT 1x1wk, 2x3wks. 1x1wk.  Verbal Orders given.

## 2021-03-14 NOTE — Telephone Encounter (Signed)
Received refill request from [facility]

## 2021-03-16 DIAGNOSIS — I10 Essential (primary) hypertension: Secondary | ICD-10-CM | POA: Diagnosis not present

## 2021-03-16 DIAGNOSIS — Z8673 Personal history of transient ischemic attack (TIA), and cerebral infarction without residual deficits: Secondary | ICD-10-CM | POA: Diagnosis not present

## 2021-03-16 DIAGNOSIS — G5793 Unspecified mononeuropathy of bilateral lower limbs: Secondary | ICD-10-CM | POA: Diagnosis not present

## 2021-03-16 DIAGNOSIS — H401113 Primary open-angle glaucoma, right eye, severe stage: Secondary | ICD-10-CM | POA: Diagnosis not present

## 2021-03-16 DIAGNOSIS — Z7902 Long term (current) use of antithrombotics/antiplatelets: Secondary | ICD-10-CM | POA: Diagnosis not present

## 2021-03-16 DIAGNOSIS — E785 Hyperlipidemia, unspecified: Secondary | ICD-10-CM | POA: Diagnosis not present

## 2021-03-16 DIAGNOSIS — H3530 Unspecified macular degeneration: Secondary | ICD-10-CM | POA: Diagnosis not present

## 2021-03-16 DIAGNOSIS — M25551 Pain in right hip: Secondary | ICD-10-CM | POA: Diagnosis not present

## 2021-03-16 DIAGNOSIS — M1710 Unilateral primary osteoarthritis, unspecified knee: Secondary | ICD-10-CM | POA: Diagnosis not present

## 2021-03-16 DIAGNOSIS — K219 Gastro-esophageal reflux disease without esophagitis: Secondary | ICD-10-CM | POA: Diagnosis not present

## 2021-03-16 DIAGNOSIS — M1990 Unspecified osteoarthritis, unspecified site: Secondary | ICD-10-CM | POA: Diagnosis not present

## 2021-03-16 DIAGNOSIS — F0391 Unspecified dementia with behavioral disturbance: Secondary | ICD-10-CM | POA: Diagnosis not present

## 2021-03-16 DIAGNOSIS — R32 Unspecified urinary incontinence: Secondary | ICD-10-CM | POA: Diagnosis not present

## 2021-03-16 DIAGNOSIS — Z9181 History of falling: Secondary | ICD-10-CM | POA: Diagnosis not present

## 2021-03-16 DIAGNOSIS — I739 Peripheral vascular disease, unspecified: Secondary | ICD-10-CM | POA: Diagnosis not present

## 2021-03-16 DIAGNOSIS — F419 Anxiety disorder, unspecified: Secondary | ICD-10-CM | POA: Diagnosis not present

## 2021-03-16 DIAGNOSIS — J4520 Mild intermittent asthma, uncomplicated: Secondary | ICD-10-CM | POA: Diagnosis not present

## 2021-03-16 DIAGNOSIS — H53122 Transient visual loss, left eye: Secondary | ICD-10-CM | POA: Diagnosis not present

## 2021-03-19 DIAGNOSIS — H401113 Primary open-angle glaucoma, right eye, severe stage: Secondary | ICD-10-CM | POA: Diagnosis not present

## 2021-03-19 DIAGNOSIS — H401124 Primary open-angle glaucoma, left eye, indeterminate stage: Secondary | ICD-10-CM | POA: Diagnosis not present

## 2021-03-20 ENCOUNTER — Other Ambulatory Visit: Payer: Self-pay | Admitting: *Deleted

## 2021-03-20 DIAGNOSIS — M1990 Unspecified osteoarthritis, unspecified site: Secondary | ICD-10-CM | POA: Diagnosis not present

## 2021-03-20 DIAGNOSIS — I10 Essential (primary) hypertension: Secondary | ICD-10-CM | POA: Diagnosis not present

## 2021-03-20 DIAGNOSIS — M1710 Unilateral primary osteoarthritis, unspecified knee: Secondary | ICD-10-CM | POA: Diagnosis not present

## 2021-03-20 DIAGNOSIS — E785 Hyperlipidemia, unspecified: Secondary | ICD-10-CM | POA: Diagnosis not present

## 2021-03-20 DIAGNOSIS — I739 Peripheral vascular disease, unspecified: Secondary | ICD-10-CM | POA: Diagnosis not present

## 2021-03-20 DIAGNOSIS — G5793 Unspecified mononeuropathy of bilateral lower limbs: Secondary | ICD-10-CM | POA: Diagnosis not present

## 2021-03-20 MED ORDER — ATORVASTATIN CALCIUM 10 MG PO TABS: 1.0000 | ORAL_TABLET | Freq: Every day | ORAL | 1 refills | Status: DC

## 2021-03-20 NOTE — Telephone Encounter (Signed)
[facility] Requested refill.

## 2021-03-22 DIAGNOSIS — M1990 Unspecified osteoarthritis, unspecified site: Secondary | ICD-10-CM | POA: Diagnosis not present

## 2021-03-22 DIAGNOSIS — I10 Essential (primary) hypertension: Secondary | ICD-10-CM | POA: Diagnosis not present

## 2021-03-22 DIAGNOSIS — E785 Hyperlipidemia, unspecified: Secondary | ICD-10-CM | POA: Diagnosis not present

## 2021-03-22 DIAGNOSIS — I739 Peripheral vascular disease, unspecified: Secondary | ICD-10-CM | POA: Diagnosis not present

## 2021-03-22 DIAGNOSIS — M1710 Unilateral primary osteoarthritis, unspecified knee: Secondary | ICD-10-CM | POA: Diagnosis not present

## 2021-03-22 DIAGNOSIS — G5793 Unspecified mononeuropathy of bilateral lower limbs: Secondary | ICD-10-CM | POA: Diagnosis not present

## 2021-03-26 DIAGNOSIS — E785 Hyperlipidemia, unspecified: Secondary | ICD-10-CM | POA: Diagnosis not present

## 2021-03-26 DIAGNOSIS — M1990 Unspecified osteoarthritis, unspecified site: Secondary | ICD-10-CM | POA: Diagnosis not present

## 2021-03-26 DIAGNOSIS — G5793 Unspecified mononeuropathy of bilateral lower limbs: Secondary | ICD-10-CM | POA: Diagnosis not present

## 2021-03-26 DIAGNOSIS — M1710 Unilateral primary osteoarthritis, unspecified knee: Secondary | ICD-10-CM | POA: Diagnosis not present

## 2021-03-26 DIAGNOSIS — I739 Peripheral vascular disease, unspecified: Secondary | ICD-10-CM | POA: Diagnosis not present

## 2021-03-26 DIAGNOSIS — I10 Essential (primary) hypertension: Secondary | ICD-10-CM | POA: Diagnosis not present

## 2021-03-29 DIAGNOSIS — I10 Essential (primary) hypertension: Secondary | ICD-10-CM | POA: Diagnosis not present

## 2021-03-29 DIAGNOSIS — M1710 Unilateral primary osteoarthritis, unspecified knee: Secondary | ICD-10-CM | POA: Diagnosis not present

## 2021-03-29 DIAGNOSIS — M1990 Unspecified osteoarthritis, unspecified site: Secondary | ICD-10-CM | POA: Diagnosis not present

## 2021-03-29 DIAGNOSIS — E785 Hyperlipidemia, unspecified: Secondary | ICD-10-CM | POA: Diagnosis not present

## 2021-03-29 DIAGNOSIS — G5793 Unspecified mononeuropathy of bilateral lower limbs: Secondary | ICD-10-CM | POA: Diagnosis not present

## 2021-03-29 DIAGNOSIS — I739 Peripheral vascular disease, unspecified: Secondary | ICD-10-CM | POA: Diagnosis not present

## 2021-04-03 DIAGNOSIS — M1990 Unspecified osteoarthritis, unspecified site: Secondary | ICD-10-CM | POA: Diagnosis not present

## 2021-04-03 DIAGNOSIS — E785 Hyperlipidemia, unspecified: Secondary | ICD-10-CM | POA: Diagnosis not present

## 2021-04-03 DIAGNOSIS — G5793 Unspecified mononeuropathy of bilateral lower limbs: Secondary | ICD-10-CM | POA: Diagnosis not present

## 2021-04-03 DIAGNOSIS — M1710 Unilateral primary osteoarthritis, unspecified knee: Secondary | ICD-10-CM | POA: Diagnosis not present

## 2021-04-03 DIAGNOSIS — I10 Essential (primary) hypertension: Secondary | ICD-10-CM | POA: Diagnosis not present

## 2021-04-03 DIAGNOSIS — I739 Peripheral vascular disease, unspecified: Secondary | ICD-10-CM | POA: Diagnosis not present

## 2021-04-04 ENCOUNTER — Other Ambulatory Visit: Payer: Self-pay | Admitting: Cardiology

## 2021-04-05 DIAGNOSIS — E785 Hyperlipidemia, unspecified: Secondary | ICD-10-CM | POA: Diagnosis not present

## 2021-04-05 DIAGNOSIS — M1710 Unilateral primary osteoarthritis, unspecified knee: Secondary | ICD-10-CM | POA: Diagnosis not present

## 2021-04-05 DIAGNOSIS — I10 Essential (primary) hypertension: Secondary | ICD-10-CM | POA: Diagnosis not present

## 2021-04-05 DIAGNOSIS — I739 Peripheral vascular disease, unspecified: Secondary | ICD-10-CM | POA: Diagnosis not present

## 2021-04-05 DIAGNOSIS — G5793 Unspecified mononeuropathy of bilateral lower limbs: Secondary | ICD-10-CM | POA: Diagnosis not present

## 2021-04-05 DIAGNOSIS — M1990 Unspecified osteoarthritis, unspecified site: Secondary | ICD-10-CM | POA: Diagnosis not present

## 2021-04-11 DIAGNOSIS — M1710 Unilateral primary osteoarthritis, unspecified knee: Secondary | ICD-10-CM | POA: Diagnosis not present

## 2021-04-11 DIAGNOSIS — M1990 Unspecified osteoarthritis, unspecified site: Secondary | ICD-10-CM | POA: Diagnosis not present

## 2021-04-11 DIAGNOSIS — E785 Hyperlipidemia, unspecified: Secondary | ICD-10-CM | POA: Diagnosis not present

## 2021-04-11 DIAGNOSIS — I10 Essential (primary) hypertension: Secondary | ICD-10-CM | POA: Diagnosis not present

## 2021-04-11 DIAGNOSIS — G5793 Unspecified mononeuropathy of bilateral lower limbs: Secondary | ICD-10-CM | POA: Diagnosis not present

## 2021-04-11 DIAGNOSIS — I739 Peripheral vascular disease, unspecified: Secondary | ICD-10-CM | POA: Diagnosis not present

## 2021-04-17 ENCOUNTER — Ambulatory Visit: Payer: Medicare Other | Admitting: Family

## 2021-04-18 ENCOUNTER — Ambulatory Visit: Payer: Medicare Other | Admitting: Family

## 2021-04-23 ENCOUNTER — Ambulatory Visit (INDEPENDENT_AMBULATORY_CARE_PROVIDER_SITE_OTHER): Payer: Medicare Other | Admitting: Family

## 2021-04-23 ENCOUNTER — Encounter: Payer: Self-pay | Admitting: Family

## 2021-04-23 ENCOUNTER — Other Ambulatory Visit: Payer: Self-pay

## 2021-04-23 VITALS — BP 130/60 | HR 51 | Temp 97.8°F | Resp 18 | Ht 62.0 in | Wt 135.0 lb

## 2021-04-23 DIAGNOSIS — E785 Hyperlipidemia, unspecified: Secondary | ICD-10-CM | POA: Diagnosis not present

## 2021-04-23 DIAGNOSIS — Z23 Encounter for immunization: Secondary | ICD-10-CM | POA: Diagnosis not present

## 2021-04-23 DIAGNOSIS — D229 Melanocytic nevi, unspecified: Secondary | ICD-10-CM

## 2021-04-23 DIAGNOSIS — K219 Gastro-esophageal reflux disease without esophagitis: Secondary | ICD-10-CM | POA: Diagnosis not present

## 2021-04-23 DIAGNOSIS — I739 Peripheral vascular disease, unspecified: Secondary | ICD-10-CM

## 2021-04-23 DIAGNOSIS — I10 Essential (primary) hypertension: Secondary | ICD-10-CM

## 2021-04-23 DIAGNOSIS — J4520 Mild intermittent asthma, uncomplicated: Secondary | ICD-10-CM | POA: Diagnosis not present

## 2021-04-23 LAB — COMPLETE METABOLIC PANEL WITH GFR
AG Ratio: 1.4 (calc) (ref 1.0–2.5)
ALT: 13 U/L (ref 6–29)
AST: 17 U/L (ref 10–35)
Albumin: 3.8 g/dL (ref 3.6–5.1)
Alkaline phosphatase (APISO): 74 U/L (ref 37–153)
BUN: 13 mg/dL (ref 7–25)
CO2: 26 mmol/L (ref 20–32)
Calcium: 9.3 mg/dL (ref 8.6–10.4)
Chloride: 109 mmol/L (ref 98–110)
Creat: 0.78 mg/dL (ref 0.60–0.95)
Globulin: 2.7 g/dL (calc) (ref 1.9–3.7)
Glucose, Bld: 98 mg/dL (ref 65–139)
Potassium: 4.2 mmol/L (ref 3.5–5.3)
Sodium: 141 mmol/L (ref 135–146)
Total Bilirubin: 0.6 mg/dL (ref 0.2–1.2)
Total Protein: 6.5 g/dL (ref 6.1–8.1)
eGFR: 70 mL/min/{1.73_m2} (ref >=60)

## 2021-04-23 LAB — CBC WITH DIFFERENTIAL/PLATELET
Absolute Monocytes: 479 cells/uL (ref 200–950)
Basophils Absolute: 28 cells/uL (ref 0–200)
Basophils Relative: 0.6 %
Eosinophils Absolute: 80 cells/uL (ref 15–500)
Eosinophils Relative: 1.7 %
HCT: 35.2 % (ref 35.0–45.0)
Hemoglobin: 11.4 g/dL — ABNORMAL LOW (ref 11.7–15.5)
Lymphs Abs: 1556 cells/uL (ref 850–3900)
MCH: 31.5 pg (ref 27.0–33.0)
MCHC: 32.4 g/dL (ref 32.0–36.0)
MCV: 97.2 fL (ref 80.0–100.0)
MPV: 10.8 fL (ref 7.5–12.5)
Monocytes Relative: 10.2 %
Neutro Abs: 2557 cells/uL (ref 1500–7800)
Neutrophils Relative %: 54.4 %
Platelets: 204 10*3/uL (ref 140–400)
RBC: 3.62 10*6/uL — ABNORMAL LOW (ref 3.80–5.10)
RDW: 12.7 % (ref 11.0–15.0)
Total Lymphocyte: 33.1 %
WBC: 4.7 10*3/uL (ref 3.8–10.8)

## 2021-04-23 LAB — LIPID PANEL
Cholesterol: 122 mg/dL (ref <200)
HDL: 62 mg/dL (ref >=50)
LDL Cholesterol (Calc): 47 mg/dL (calc)
Non-HDL Cholesterol (Calc): 60 mg/dL (calc) (ref <130)
Total CHOL/HDL Ratio: 2 (calc) (ref <5.0)
Triglycerides: 52 mg/dL (ref <150)

## 2021-04-23 LAB — TSH: TSH: 1.97 mIU/L (ref 0.40–4.50)

## 2021-04-23 MED ORDER — TETANUS-DIPHTH-ACELL PERTUSSIS 5-2.5-18.5 LF-MCG/0.5 IM SUSP: 0.5000 mL | Freq: Once | INTRAMUSCULAR | 0 refills | Status: AC

## 2021-04-23 NOTE — Patient Instructions (Signed)
-   Please get Tetanus vaccine and COVID-19  booster vaccine at your pharmacy.

## 2021-04-23 NOTE — Progress Notes (Signed)
Provider: Marlowe Sax FNP-C   Lakiyah Arntson, Nelda Bucks, NP  Patient Care Team: Taylin Leder, Nelda Bucks, NP as PCP - General (Family Medicine) Sueanne Margarita, MD as PCP - Cardiology (Cardiology) Trula Slade, DPM as Consulting Physician (Podiatry) Bond, Tracie Harrier, MD as Referring Physician (Ophthalmology)  Extended Emergency Contact Information Primary Emergency Contact: Avery,Carl          [place] [address] [place] Phone: [phone] Mobile Phone: [phone] Relation: Son Secondary Emergency Contact: Stormy Fabian [place] Phone: [phone] Mobile Phone: [phone] Relation: Daughter  Code Status:  DNR Goals of care: Advanced Directive information Advanced Directives 02/09/2021  Does Patient Have a Medical Advance Directive? Yes  Type of Advance Directive Out of facility DNR (pink MOST or yellow form)  Does patient want to make changes to medical advance directive? No - Patient declined  Copy of [place] in Chart? -  Would patient like information on creating a medical advance directive? -  Pre-existing out of facility DNR order (yellow form or pink MOST form) -     Chief Complaint  Patient presents with   Medical Management of Chronic Issues    Patient presents today for 6 month follow-up.   Quality Metric Gaps    Per patients care gap she is due for tetanus and Covid booster vaccines.    HPI:  Pt is a 87 y.o. female seen today for 6 months follow up for medical management of chronic diseases. She is here with son.she complains of lower abdominal skin area that is itchy for several months.seems to be getting worst . Otherwise has been doing well. Reports no fall episode.she walks with a walker at home but uses a wheelchair when out of the house due to her vision. States appetite is good. Son states no concerns.He assist her with her ADL's.   She is due for Tdap vaccine.script previous send to  pharmacy but did not go to get her vaccine.Also due for 5 th COVID-19 booster vaccine    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   Edema extremities    GERD (gastroesophageal reflux disease)    Glaucoma    Macular degeneration    Per patient    Osteoarthritis    PVD (peripheral vascular disease) ([place])    Seasonal allergies    Stroke [facility])    Past Surgical History:  Procedure Laterality Date   ABDOMINAL HYSTERECTOMY  1981   TONSILLECTOMY  1953    Allergies  Allergen Reactions   Aspirin Other (See Comments)    sweats   Codeine Other (See Comments)   Iodine Other (See Comments)    Pt is not aware of this allergy, does not recall much info.   Prednisone Other (See Comments)    sweats   Lyrica [Pregabalin]     Wobbly, "more than sleepy"   Benzonatate Itching and Rash   Sulfa Antibiotics Other (See Comments)    Doesn't remember    Allergies as of 04/23/2021       Reactions   Aspirin Other (See Comments)   sweats   Codeine Other (See Comments)   Iodine Other (See Comments)   Pt is not aware of this allergy, does not recall much info.   Prednisone Other (See Comments)   sweats   Lyrica [pregabalin]    Wobbly, "more than sleepy"  Benzonatate Itching, Rash   Sulfa Antibiotics Other (See Comments)   Doesn't remember        Medication List        Accurate as of April 23, 2021  1:01 PM. If you have any questions, ask your nurse or doctor.          acetaminophen 500 MG tablet Commonly known as: TYLENOL Take 500 mg by mouth every 6 (six) hours as needed for mild pain or moderate pain.   albuterol 108 (90 Base) MCG/ACT inhaler Commonly known as: VENTOLIN HFA Inhale 2 puffs into the lungs every 6 (six) hours as needed for wheezing.   alum hydroxide-mag trisilicate 31-49 MG Chew chewable tablet Commonly known as: GAVISCON Chew 2 tablets by mouth 2 (two) times daily.    amLODipine 5 MG tablet Commonly known as: NORVASC Take 1 tablet (5 mg total) by mouth daily. Please make overdue appt with Dr. Radford Pax before anymore refills. Thank you 1st attempt   atorvastatin 10 MG tablet Commonly known as: LIPITOR Take 1 tablet (10 mg total) by mouth daily.   atropine 1 % ophthalmic solution Place 1 drop into the right eye daily.   B COMPLEX PO Take 1 tablet by mouth daily.   Biofreeze 4 % Gel Generic drug: Menthol (Topical Analgesic) Apply 1 application topically 3 (three) times daily. Apply to knee   Brinzolamide-Brimonidine 1-0.2 % Susp Apply 1 drop to eye 3 (three) times daily.   budesonide-formoterol 80-4.5 MCG/ACT inhaler Commonly known as: SYMBICORT Inhale 2 puffs into the lungs 2 (two) times daily.   CLARITIN PO Take 5 mg by mouth as needed.   clopidogrel 75 MG tablet Commonly known as: PLAVIX Take 1 tablet (75 mg total) by mouth daily.   CoQ10 50 MG Caps Take 50 mg by mouth daily.   docusate sodium 100 MG capsule Commonly known as: COLACE TAKE 1 CAPSULE BY MOUTH TWICE A DAY   Fish Oil 1200 MG Cpdr Take 1,200 mg by mouth daily. Take one tablet by mouth once daily   fluticasone 50 MCG/ACT nasal spray Commonly known as: FLONASE Place 1 spray into the nose daily.   hydrocortisone 2.5 % rectal cream Commonly known as: Anusol-HC Place 1 application rectally daily as needed for hemorrhoids or anal itching.   NONFORMULARY OR COMPOUNDED ITEM 1 application 2 (two) times daily as needed. [facility]:  Peripheral Neuropathy Cream - Bupivacaine 1%, Doxepin 3%, Gabapentin 6%, Pentoxifylline 3%, Topiramate 1%, apply 1-2 grams to affected areas 3-4 times daily prn.   pantoprazole 40 MG tablet Commonly known as: PROTONIX Take 1 tablet (40 mg total) by mouth 2 (two) times daily before a meal.   PREDNISOLONE ACETATE OP Place 1 drop into the right eye 3 (three) times daily.   prednisoLONE acetate 1 % ophthalmic suspension Commonly known  as: PRED FORTE Place 1 drop into the right eye daily.   risperiDONE 0.5 MG tablet Commonly known as: RISPERDAL TAKE 1 TABLET BY MOUTH TWICE A DAY   PreserVision AREDS 2 Caps Take 1 capsule by mouth 2 (two) times daily.   Senior Multivitamin Plus Tabs Take 1 tablet by mouth daily.   senna-docusate 8.6-50 MG tablet Commonly known as: Senokot S Take 2 tablets by mouth daily.   sodium chloride 5 % ophthalmic solution Commonly known as: MURO 128 Place 1 drop into the left eye 4 times daily.   Travatan Z 0.004 % Soln ophthalmic solution Generic drug: Travoprost (BAK Free)   TROPICAMIDE OP Place 1  drop into the right eye daily.   vitamin C 500 MG tablet Commonly known as: ASCORBIC ACID Take 500 mg by mouth daily.   Vitamin D3 50 MCG (2000 UT) Tabs Take 2,000 Units by mouth daily.        Review of Systems  Constitutional:  Negative for appetite change, chills, fatigue, fever and unexpected weight change.  HENT:  Negative for congestion, dental problem, ear discharge, ear pain, facial swelling, hearing loss, nosebleeds, postnasal drip, rhinorrhea, sinus pressure, sinus pain, sneezing, sore throat, tinnitus and trouble swallowing.   Eyes:  Positive for visual disturbance. Negative for pain, discharge, redness and itching.       Macular degeneration   Respiratory:  Negative for cough, chest tightness, shortness of breath and wheezing.   Cardiovascular:  Negative for chest pain, palpitations and leg swelling.  Gastrointestinal:  Negative for abdominal distention, abdominal pain, blood in stool, constipation, diarrhea, nausea and vomiting.       Indigestion symptoms have improve with meds.   Endocrine: Negative for cold intolerance, heat intolerance, polydipsia, polyphagia and polyuria.  Genitourinary:  Negative for difficulty urinating, dysuria, flank pain, frequency and urgency.  Musculoskeletal:  Negative for arthralgias, back pain, gait problem, joint swelling, myalgias, neck  pain and neck stiffness.  Skin:  Negative for color change, pallor, rash and wound.  Neurological:  Negative for dizziness, syncope, speech difficulty, light-headedness, numbness and headaches.       Bilateral leg weakness walks with a walker   Hematological:  Does not bruise/bleed easily.  Psychiatric/Behavioral:  Negative for agitation, behavioral problems, confusion, hallucinations, self-injury, sleep disturbance and suicidal ideas. The patient is not nervous/anxious.    Immunization History  Administered Date(s) Administered   Fluad Quad(high Dose 65+) 06/04/2019, 07/21/2020   Influenza, High Dose Seasonal PF 10/19/2013, 08/02/2014, 08/11/2015, 11/22/2016, 07/16/2017, 11/21/2017, 07/20/2018, 11/19/2018, 11/18/2019, 11/17/2020   Influenza,inj,Quad PF,6+ Mos 06/11/2013, 06/30/2014, 07/17/2015, 06/27/2016   Influenza-Unspecified 07/07/2012   PFIZER(Purple Top)SARS-COV-2 Vaccination 11/20/2019, 12/13/2019, 09/20/2020, 11/17/2020   Pneumococcal Conjugate-13 11/07/2014   Pneumococcal Polysaccharide-23 10/08/2007, 08/11/2015, 11/22/2016, 11/21/2017, 11/19/2018, 11/18/2019, 11/17/2020   Zoster Recombinat (Shingrix) 07/31/2017, 12/09/2017   Pertinent  Health Maintenance Due  Topic Date Due   INFLUENZA VACCINE  05/07/2021   DEXA SCAN  Completed   PNA vac Low Risk Adult  Completed   Fall Risk  02/09/2021 10/05/2020 08/25/2020 06/05/2020 05/02/2020  Falls in the past year? 1 0 _0 Number falls in past yr: 0 0 0 0 1  Injury with Fall? 1 0 0 1 0   Functional Status Survey:    Vitals:   04/23/21 1251  BP: 130/60  Pulse: (!) 51  Resp: 18  Temp: 97.8 F (36.6 C)  TempSrc: Temporal  SpO2: 98%  Height: _1  (1.575 m)   Body mass index is 24.34 kg/m. Physical Exam Vitals reviewed.  Constitutional:      General: She is not in acute distress.    Appearance: Normal appearance. She is normal weight. She is not ill-appearing or diaphoretic.  HENT:     Head: Normocephalic.     Right  Ear: Tympanic membrane, ear canal and external ear normal. There is no impacted cerumen.     Left Ear: There is impacted cerumen.     Nose: Nose normal. No congestion or rhinorrhea.     Mouth/Throat:     Mouth: Mucous membranes are moist.     Pharynx: Oropharynx is clear. No oropharyngeal exudate or posterior oropharyngeal erythema.  Eyes:  General: No scleral icterus.       Right eye: No discharge.        Left eye: No discharge.     Extraocular Movements: Extraocular movements intact.     Conjunctiva/sclera: Conjunctivae normal.     Pupils: Pupils are equal, round, and reactive to light.  Neck:     Vascular: No carotid bruit.  Cardiovascular:     Rate and Rhythm: Normal rate and regular rhythm.     Pulses: Normal pulses.     Heart sounds: Normal heart sounds. No murmur heard.   No friction rub. No gallop.  Pulmonary:     Effort: Pulmonary effort is normal. No respiratory distress.     Breath sounds: Normal breath sounds. No wheezing, rhonchi or rales.  Chest:     Chest wall: No tenderness.  Abdominal:     General: Bowel sounds are normal. There is no distension.     Palpations: Abdomen is soft. There is no mass.     Tenderness: There is no abdominal tenderness. There is no right CVA tenderness, left CVA tenderness, guarding or rebound.  Musculoskeletal:        General: No swelling or tenderness. Normal range of motion.     Cervical back: Normal range of motion. No rigidity or tenderness.     Right lower leg: No edema.     Left lower leg: No edema.  Lymphadenopathy:     Cervical: No cervical adenopathy.  Skin:    General: Skin is warm and dry.     Coloration: Skin is not pale.     Findings: No bruising, erythema, lesion or rash.     Comments: > 6 mm flat macular with shades of black and brown slightly rough  texture.  Neurological:     Mental Status: She is alert and oriented to person, place, and time.     Cranial Nerves: No cranial nerve deficit.     Sensory: No  sensory deficit.     Motor: No weakness.     Coordination: Coordination normal.     Gait: Gait normal.  Psychiatric:        Mood and Affect: Mood normal.        Speech: Speech normal.        Behavior: Behavior normal.        Thought Content: Thought content normal.        Judgment: Judgment normal.    Labs reviewed: Recent Labs    10/05/20 1407 02/09/21 1434  NA 139 139  K 4.0 3.9  CL 108 104  CO2 24 24  GLUCOSE 92 119  BUN 12 14  CREATININE 0.81 0.83  CALCIUM 9.1 9.8   Recent Labs    10/05/20 1407  AST 15  ALT 12  BILITOT 0.7  PROT 6.4   Recent Labs    10/05/20 1407 02/09/21 1434  WBC 6.2 7.8  NEUTROABS 3,398 6,614  HGB 12.1 12.8  HCT 36.1 38.1  MCV 96.0 96.0  PLT 212 186   Lab Results  Component Value Date   TSH 1.75 10/05/2020   Lab Results  Component Value Date   HGBA1C 5.6 04/22/2016   Lab Results  Component Value Date   CHOL 127 10/05/2020   HDL 64 10/05/2020   LDLCALC 50 10/05/2020   TRIG 57 10/05/2020   CHOLHDL 2.0 10/05/2020    Significant Diagnostic Results in last 30 days:  No results found.  Assessment/Plan 1. Essential hypertension B/p well controlled. Continue  on Amlodipine  - on Atorvastatin and Plavix  - TSH - CMP with eGFR([facility]) - CBC with Differential/Platelet  2. Hyperlipidemia, unspecified hyperlipidemia type LDL at goal  Continue on Atorvastatin and Omega - 3 fatty acids   - Lipid panel  3. Gastroesophageal reflux disease without esophagitis Symptoms well controlled on Protonix. No reports of blood in the stool.   4. Mild intermittent asthma without complication Symptoms stable.No wheezes noted.  Continue on Albuterol inhaler and Symbicort   5. Need for Tdap vaccination Script send to pharmacy advised to get Tetanus vaccine at her pharmacy - Tdap ([place]) 5-2.5-18.5 LF-MCG/0.5 injection; Inject 0.5 mLs into the muscle once for 1 dose.  Dispense: 0.5 mL; Refill: 0  6. Change in skin mole > 6 mm flat  macula with shades of black and brown slightly rough  texture.worsening itching.will send to dermatologist for evaluation.  - Ambulatory referral to Dermatology  7. PVD (peripheral vascular disease) ([place]) No edema or ulceration.   Family/ staff Communication: Reviewed plan of care with patient and son verbalized understanding   Labs/tests ordered:  - CBC with Differential/Platelet - CMP with eGFR([facility]) - TSH - Lipid panel  Next Appointment : 6 months for medical management of chronic issues.  Sandrea Hughs, NP

## 2021-04-29 ENCOUNTER — Other Ambulatory Visit: Payer: Self-pay | Admitting: Cardiology

## 2021-05-08 ENCOUNTER — Other Ambulatory Visit: Payer: Self-pay

## 2021-05-08 ENCOUNTER — Ambulatory Visit (INDEPENDENT_AMBULATORY_CARE_PROVIDER_SITE_OTHER): Payer: Medicare Other | Admitting: Podiatry

## 2021-05-08 DIAGNOSIS — J300 Vasomotor rhinitis: Secondary | ICD-10-CM | POA: Insufficient documentation

## 2021-05-08 DIAGNOSIS — B351 Tinea unguium: Secondary | ICD-10-CM | POA: Diagnosis not present

## 2021-05-08 DIAGNOSIS — M79675 Pain in left toe(s): Secondary | ICD-10-CM | POA: Diagnosis not present

## 2021-05-08 DIAGNOSIS — M79674 Pain in right toe(s): Secondary | ICD-10-CM | POA: Diagnosis not present

## 2021-05-08 DIAGNOSIS — J4530 Mild persistent asthma, uncomplicated: Secondary | ICD-10-CM | POA: Insufficient documentation

## 2021-05-08 DIAGNOSIS — J309 Allergic rhinitis, unspecified: Secondary | ICD-10-CM | POA: Insufficient documentation

## 2021-05-11 ENCOUNTER — Other Ambulatory Visit: Payer: Self-pay | Admitting: Cardiology

## 2021-05-12 ENCOUNTER — Encounter: Payer: Self-pay | Admitting: Podiatry

## 2021-05-12 NOTE — Progress Notes (Signed)
Subjective: [name] [name] is a pleasant 87 y.o. female patient seen today painful thick toenails that are difficult to trim. Pain interferes with ambulation. Aggravating factors include wearing enclosed shoe gear. Pain is relieved with periodic professional debridement.  She has h/o neuropathy. She voices no new pedal problems on today's visit.  PCP is Ngetich, Nelda Bucks, NP. Last visit was: 04/23/2021.  Allergies  Allergen Reactions   Aspirin Other (See Comments)    sweats   Codeine Other (See Comments)   Iodine Other (See Comments)    Pt is not aware of this allergy, does not recall much info.   Prednisone Other (See Comments)    sweats   Lyrica [Pregabalin]     Wobbly, "more than sleepy"   Benzonatate Itching and Rash   Sulfa Antibiotics Other (See Comments)    Doesn't remember    Objective: Physical Exam  General: [name] [name] is a pleasant 87 y.o. African American female, WD, WN in NAD. AAO x 3.   Vascular:  Capillary fill time to digits <3 seconds b/l lower extremities. Palpable pedal pulses b/l LE. Pedal hair absent. Lower extremity skin temperature gradient within normal limits.  Dermatological:  Pedal skin with normal turgor, texture and tone b/l lower extremities. No open wounds b/l lower extremities. No interdigital macerations b/l lower extremities. Toenails 1-5 b/l elongated, discolored, dystrophic, thickened, crumbly with subungual debris and tenderness to dorsal palpation. No hyperkeratotic nor porokeratotic lesions present on today's visit.  Musculoskeletal:  Normal muscle strength 5/5 to all lower extremity muscle groups bilaterally. Hammertoe(s) noted to the L 4th toe.  Neurological:  Pt has subjective symptoms of neuropathy.  Assessment and Plan:  1. Pain due to onychomycosis of toenails of both feet      -Examined patient. -Patient to continue soft, supportive shoe gear daily. -Toenails 1-5 b/l were debrided in length and girth with sterile nail  nippers and dremel without iatrogenic bleeding.  -Patient to report any pedal injuries to medical professional immediately. -Patient/POA to call should there be question/concern in the interim.  Return in about 3 months (around 08/08/2021).  Marzetta Board, DPM

## 2021-05-23 ENCOUNTER — Telehealth: Payer: Self-pay | Admitting: Cardiology

## 2021-05-23 MED ORDER — AMLODIPINE BESYLATE 5 MG PO TABS: 5.0000 mg | ORAL_TABLET | Freq: Every day | ORAL | 0 refills | Status: DC

## 2021-05-23 NOTE — Telephone Encounter (Signed)
*  STAT* If patient is at the pharmacy, call can be transferred to refill team.   1. Which medications need to be refilled? (please list name of each medication and dose if known) amLODipine (NORVASC) 5 MG tablet  2. Which pharmacy/location (including street and city if local pharmacy) is medication to be sent to?  [id number] [place], [place] - [address]  3. Do they need a 30 day or 90 day supply? [address]

## 2021-05-23 NOTE — Telephone Encounter (Signed)
Medication was refilled by refill team.

## 2021-08-06 DIAGNOSIS — L820 Inflamed seborrheic keratosis: Secondary | ICD-10-CM | POA: Diagnosis not present

## 2021-08-14 ENCOUNTER — Ambulatory Visit (INDEPENDENT_AMBULATORY_CARE_PROVIDER_SITE_OTHER): Payer: Medicare Other | Admitting: Podiatry

## 2021-08-14 ENCOUNTER — Other Ambulatory Visit: Payer: Self-pay

## 2021-08-14 ENCOUNTER — Encounter: Payer: Self-pay | Admitting: Podiatry

## 2021-08-14 DIAGNOSIS — M79675 Pain in left toe(s): Secondary | ICD-10-CM

## 2021-08-14 DIAGNOSIS — M79674 Pain in right toe(s): Secondary | ICD-10-CM

## 2021-08-14 DIAGNOSIS — B351 Tinea unguium: Secondary | ICD-10-CM

## 2021-08-14 DIAGNOSIS — G5793 Unspecified mononeuropathy of bilateral lower limbs: Secondary | ICD-10-CM

## 2021-08-14 NOTE — Patient Instructions (Signed)
Apply antibiotic cream to left great toe once daily for 3 days.

## 2021-08-19 NOTE — Progress Notes (Signed)
  Subjective:  [name], female    DOB: 08/05/1924,  MRN: [id number]  [name] [name] presents to clinic today for at risk foot care. Patient has h/o PAD and neuropathy and painful elongated mycotic toenails 1-5 bilaterally which are tender when wearing enclosed shoe gear. Pain is relieved with periodic professional debridement.  Patient is blind. She is wheelchair dependent. [name] [name] voices no new pedal problems on today's visit. Her son has brought her to appointment and is waiting in the office lobby.  PCP is Ngetich, Dinah C, NP , and last visit was 04/23/2021.  Allergies  Allergen Reactions   Aspirin Other (See Comments)    sweats   Codeine Other (See Comments)   Iodine Other (See Comments)    Pt is not aware of this allergy, does not recall much info.   Prednisone Other (See Comments)    sweats   Lyrica [Pregabalin]     Wobbly, "more than sleepy"   Benzonatate Itching and Rash   Sulfa Antibiotics Other (See Comments)    Doesn't remember    Review of Systems: Negative except as noted in the HPI. Objective:   Constitutional [name] [name] is a pleasant 87 y.o. African American female, WD, WN in NAD. AAO x 3.   Vascular CFT <3 seconds b/l LE. Palpable DP/PT pulses b/l LE. Digital hair absent b/l. Skin temperature gradient WNL b/l. No pain with calf compression b/l. No edema noted b/l. Lower extremity skin temperature gradient within normal limits. No cyanosis or clubbing noted.  Neurologic Normal speech. Oriented to person, place, and time. Pt has subjective symptoms of neuropathy. Protective sensation intact 5/5 intact bilaterally with 10g monofilament b/l.  Dermatologic Pedal integument with normal turgor, texture and tone b/l LE. No open wounds b/l. No interdigital macerations b/l. Toenails 1-5 b/l elongated, thickened, discolored with subungual debris. +Tenderness with dorsal palpation of nailplates. No hyperkeratotic or porokeratotic lesions present.  Incurvated nailplate medial border(s) L hallux.  Nail border hypertrophy minimal. There is tenderness to palpation. Sign(s) of infection: no clinical signs of infection noted on examination today..  Orthopedic: Normal muscle strength 5/5 to all lower extremity muscle groups bilaterally. Hammertoe(s) noted to the L 4th toe. Utilizes wheelchair for mobility assistance.   Radiographs: None Assessment:   1. Pain due to onychomycosis of toenails of both feet   2. Neuropathy of both feet    Plan:  Patient was evaluated and treated and all questions answered. Consent given for treatment as described below: -Examined patient. -Patient to continue soft, supportive shoe gear daily. -Toenails 1-5 b/l were debrided in length and girth with sterile nail nippers and dremel without iatrogenic bleeding.  -Offending nail border debrided and curretaged L hallux utilizing sterile nail nipper and currette. Border(s) cleansed with alcohol and triple antibiotic applied. Patient instructed to apply antibiotic cream  to L hallux once daily for 3 days. Son informed verbally and given written instructions. -Patient/POA to call should there be question/concern in the interim.  Return in about 3 months (around 11/14/2021).  Marzetta Board, DPM

## 2021-08-28 ENCOUNTER — Encounter: Payer: Medicare Other | Admitting: Family

## 2021-08-28 ENCOUNTER — Ambulatory Visit (INDEPENDENT_AMBULATORY_CARE_PROVIDER_SITE_OTHER): Payer: Medicare Other | Admitting: Nurse Practitioner

## 2021-08-28 ENCOUNTER — Other Ambulatory Visit: Payer: Self-pay

## 2021-08-28 ENCOUNTER — Encounter: Payer: Self-pay | Admitting: Nurse Practitioner

## 2021-08-28 DIAGNOSIS — Z0000 Encounter for general adult medical examination without abnormal findings: Secondary | ICD-10-CM

## 2021-08-28 NOTE — Progress Notes (Signed)
Subjective:   [name] [name] is a 87 y.o. female who presents for Medicare Annual (Subsequent) preventive examination.  Review of Systems     Cardiac Risk Factors include: advanced age (>59men, >35 women);dyslipidemia;hypertension;sedentary lifestyle     Objective:    There were no vitals filed for this visit. There is no height or weight on file to calculate BMI.  Advanced Directives 08/28/2021 02/09/2021 10/05/2020 08/25/2020 06/05/2020 05/02/2020 08/23/2019  Does Patient Have a Medical Advance Directive? No Yes Yes Yes Yes Yes Yes  Type of Advance Directive - Out of facility DNR (pink MOST or yellow form) - Out of facility DNR (pink MOST or yellow form) Out of facility DNR (pink MOST or yellow form) Out of facility DNR (pink MOST or yellow form) Living will;Healthcare Power of Attorney  Does patient want to make changes to medical advance directive? - No - Patient declined No - Patient declined No - Patient declined No - Patient declined No - Patient declined No - Patient declined  Copy of Healthcare Power of Attorney in Chart? - - - - - - Yes - validated most recent copy scanned in chart (See row information)  Would patient like information on creating a medical advance directive? - - - - - - -  Pre-existing out of facility DNR order (yellow form or pink MOST form) - - - - - - -    Current Medications (verified) Outpatient Encounter Medications as of 08/28/2021  Medication Sig   acetaminophen (TYLENOL) 500 MG tablet Take 500 mg by mouth every 6 (six) hours as needed for mild pain or moderate pain.    albuterol (PROVENTIL HFA;VENTOLIN HFA) 108 (90 Base) MCG/ACT inhaler Inhale 2 puffs into the lungs every 6 (six) hours as needed for wheezing.   alum hydroxide-mag trisilicate (GAVISCON) 70-17 MG CHEW chewable tablet Chew 2 tablets by mouth 2 (two) times daily.   amLODipine (NORVASC) 5 MG tablet Take 1 tablet (5 mg total) by mouth daily. Please keep your 09/03/21 appt with Dr. Radford Pax.    atorvastatin (LIPITOR) 10 MG tablet Take 1 tablet (10 mg total) by mouth daily.   atropine 1 % ophthalmic solution Place 1 drop into the right eye daily.    Brinzolamide-Brimonidine 1-0.2 % SUSP Apply 1 drop to eye 3 (three) times daily.   budesonide-formoterol (SYMBICORT) 80-4.5 MCG/ACT inhaler Inhale 2 puffs into the lungs 2 (two) times daily.   Coenzyme Q10 (COQ10) 50 MG CAPS Take 50 mg by mouth daily.   docusate sodium (COLACE) 100 MG capsule TAKE 1 CAPSULE BY MOUTH TWICE A DAY   fluticasone (FLONASE) 50 MCG/ACT nasal spray Place 1 spray into the nose daily.   hydrocortisone (ANUSOL-HC) 2.5 % rectal cream Place 1 application rectally daily as needed for hemorrhoids or anal itching.   loratadine (CLARITIN) 10 MG tablet Take 10 mg by mouth daily.   Multiple Vitamins-Minerals (PRESERVISION AREDS 2) CAPS Take 1 capsule by mouth 2 (two) times daily.   Multiple Vitamins-Minerals (SENIOR MULTIVITAMIN PLUS) TABS Take 1 tablet by mouth daily.    NONFORMULARY OR COMPOUNDED ITEM 1 application 2 (two) times daily as needed. [facility]:  Peripheral Neuropathy Cream - Bupivacaine 1%, Doxepin 3%, Gabapentin 6%, Pentoxifylline 3%, Topiramate 1%, apply 1-2 grams to affected areas 3-4 times daily prn.   Omega-3 Fatty Acids (FISH OIL) 1200 MG CPDR Take 1,200 mg by mouth daily. Take one tablet by mouth once daily   prednisoLONE acetate (PRED FORTE) 1 % ophthalmic suspension Place 1 drop  into the right eye daily.   risperiDONE (RISPERDAL) 0.5 MG tablet TAKE 1 TABLET BY MOUTH TWICE A DAY   ROCKLATAN 0.02-0.005 % SOLN Place 1 drop into the left eye daily.   sodium chloride (MURO 128) 5 % ophthalmic solution Place 1 drop into the left eye 4 times daily.   vitamin C (ASCORBIC ACID) 500 MG tablet Take 500 mg by mouth daily.   [DISCONTINUED] clopidogrel (PLAVIX) 75 MG tablet Take 1 tablet (75 mg total) by mouth daily.   [DISCONTINUED] Loratadine (CLARITIN PO) Take 5 mg by mouth as needed.   [DISCONTINUED] B  Complex Vitamins (B COMPLEX PO) Take 1 tablet by mouth daily.   [DISCONTINUED] Cholecalciferol (VITAMIN D3) 2000 units TABS Take 2,000 Units by mouth daily. (Patient not taking: Reported on 08/28/2021)   [DISCONTINUED] Menthol, Topical Analgesic, (BIOFREEZE) 4 % GEL Apply 1 application topically 3 (three) times daily. Apply to knee   [DISCONTINUED] pantoprazole (PROTONIX) 40 MG tablet Take 1 tablet (40 mg total) by mouth 2 (two) times daily before a meal.   [DISCONTINUED] PREDNISOLONE ACETATE OP Place 1 drop into the right eye 3 (three) times daily.   [DISCONTINUED] senna-docusate (SENOKOT S) 8.6-50 MG tablet Take 2 tablets by mouth daily.   [DISCONTINUED] TRAVATAN Z 0.004 % SOLN ophthalmic solution    [DISCONTINUED] TROPICAMIDE OP Place 1 drop into the right eye daily.   No facility-administered encounter medications on file as of 08/28/2021.    Allergies (verified) Aspirin, Codeine, Iodine, Prednisone, Lyrica [pregabalin], Benzonatate, and Sulfa antibiotics   History: 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   Edema extremities    GERD (gastroesophageal reflux disease)    Glaucoma    Macular degeneration    Per patient    Osteoarthritis    PVD (peripheral vascular disease) ([place])    Seasonal allergies    Stroke [facility])    Past Surgical History:  Procedure Laterality Date   ABDOMINAL HYSTERECTOMY  1981   TONSILLECTOMY  1953   Family History  Problem Relation Age of Onset   Stroke Father    Hypertension Father    Heart disease Mother    Hypertension Mother    Diabetes Mother    Social History   Socioeconomic History   Marital status: Widowed    Spouse name: Not on file   Number of children: Not on file   Years of education: Not on file   Highest education level: Not on file  Occupational History   Not on file  Tobacco Use   Smoking status: Never   Smokeless tobacco: Never   Vaping Use   Vaping Use: Never used  Substance and Sexual Activity   Alcohol use: No    Alcohol/week: 0.0 standard drinks   Drug use: No   Sexual activity: Not Currently  Other Topics Concern   Not on file  Social History Narrative   Was cosmetologist, 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, does not have pets, has living will   Social Determinants of Health   Financial Resource Strain: Not on file  Food Insecurity: Not on file  Transportation Needs: Not on file  Physical Activity: Not on file  Stress: Not on file  Social Connections: Not on file    Tobacco Counseling Counseling given: Not Answered   Clinical Intake:  Pre-visit preparation completed: Yes  Pain : No/denies pain  BMI - recorded: 24.69 Nutritional Risks: None Diabetes: No  How often do you need to have someone help you when you read instructions, pamphlets, or other written materials from your doctor or pharmacy?: 5 - Always  Diabetic?         Activities of Daily Living In your present state of health, do you have any difficulty performing the following activities: 08/28/2021  Hearing? N  Vision? Y  Difficulty concentrating or making decisions? N  Walking or climbing stairs? Y  Dressing or bathing? Y  Doing errands, shopping? Y  Preparing Food and eating ? Y  Using the Toilet? N  In the past six months, have you accidently leaked urine? N  Do you have problems with loss of bowel control? N  Managing your Medications? Y  Managing your Finances? Y  Housekeeping or managing your Housekeeping? Y  Some recent data might be hidden    Patient Care Team: Ngetich, Nelda Bucks, NP as PCP - General (Family Medicine) Sueanne Margarita, MD as PCP - Cardiology (Cardiology) Trula Slade, DPM as Consulting Physician (Podiatry) Bond, Tracie Harrier, MD as Referring Physician (Ophthalmology)  Indicate any recent Medical Services you may have received from other than [facility] providers in the past year (date  may be approximate).     Assessment:   This is a routine wellness examination for Mendy.  Hearing/Vision screen Hearing Screening - Comments:: Patient has no problems. Vision Screening - Comments:: Patient wears glasses. Patient has not had eye exam in past year. Patient sees Dr. Edilia Bo  Dietary issues and exercise activities discussed: Current Exercise Habits: Home exercise routine, Type of exercise: stretching, Time (Minutes): 30   Goals Addressed   None    Depression Screen PHQ 2/9 Scores 08/28/2021 04/23/2021 10/05/2020 08/25/2020 06/05/2020 02/03/2020 09/27/2019  PHQ - 2 Score 0 0 0 0 0 0 0    Fall Risk Fall Risk  08/28/2021 02/09/2021 10/05/2020 08/25/2020 06/05/2020  Falls in the past year? 1 1 0 1 1  Number falls in past yr: 1 0 0 0 0  Injury with Fall? 0 1 0 0 1  Risk for fall due to : History of fall(s) - - - -  Follow up Falls evaluation completed - - - -    FALL RISK PREVENTION PERTAINING TO THE HOME:  Any stairs in or around the home? Yes  If so, are there any without handrails? No  Home free of loose throw rugs in walkways, pet beds, electrical cords, etc? Yes  Adequate lighting in your home to reduce risk of falls? Yes   ASSISTIVE DEVICES UTILIZED TO PREVENT FALLS:  Life alert? No  Use of a cane, walker or w/c? Yes  Grab bars in the bathroom? Yes  Shower chair or bench in shower? Yes  Elevated toilet seat or a handicapped toilet? Yes   TIMED UP AND GO:  Was the test performed? No .    Cognitive Function: MMSE - Mini Mental State Exam 07/31/2018 03/24/2017 01/10/2015 06/11/2013  Orientation to time 3 4 4 5   Orientation to Place 5 5 5 5   Registration 3 3 3 3   Attention/ Calculation 5 4 4 5   Recall 2 1 2 2   Language- name 2 objects 2 2 2 2   Language- repeat 1 1 1 1   Language- follow 3 step command 3 2 3 3   Language- read & follow direction 1 1 1 1   Write a sentence 1 1 1 1   Copy design 1 1  1 1  Total score 27 25 27 29      6CIT Screen 08/28/2021  08/25/2020 08/23/2019  What Year? 0 points 0 points 0 points  What month? 0 points 0 points 0 points  What time? 0 points 0 points 0 points  Count back from 20 0 points 0 points 0 points  Months in reverse 4 points 4 points 0 points  Repeat phrase 10 points 8 points 0 points  Total Score 14 12 0    Immunizations Immunization History  Administered Date(s) Administered   Fluad Quad(high Dose 65+) 06/04/2019, 07/21/2020   Influenza, High Dose Seasonal PF 10/19/2013, 08/02/2014, 08/11/2015, 11/22/2016, 07/16/2017, 11/21/2017, 07/20/2018, 11/19/2018, 11/18/2019, 11/17/2020   Influenza,inj,Quad PF,6+ Mos 06/11/2013, 06/30/2014, 07/17/2015, 06/27/2016   Influenza-Unspecified 07/07/2012   PFIZER(Purple Top)SARS-COV-2 Vaccination 11/20/2019, 12/13/2019, 09/20/2020, 11/17/2020   Pfizer Covid-19 Vaccine Bivalent Booster 19yrs & up 07/18/2021   Pneumococcal Conjugate-13 11/07/2014   Pneumococcal Polysaccharide-23 10/08/2007, 08/11/2015, 11/22/2016, 11/21/2017, 11/19/2018, 11/18/2019, 11/17/2020   Zoster Recombinat (Shingrix) 07/31/2017, 12/09/2017    TDAP status: Due, Education has been provided regarding the importance of this vaccine. Advised may receive this vaccine at local pharmacy or Health Dept. Aware to provide a copy of the vaccination record if obtained from local pharmacy or Health Dept. Verbalized acceptance and understanding.  Flu Vaccine status: Due, Education has been provided regarding the importance of this vaccine. Advised may receive this vaccine at local pharmacy or Health Dept. Aware to provide a copy of the vaccination record if obtained from local pharmacy or Health Dept. Verbalized acceptance and understanding.  Pneumococcal vaccine status: Up to date  Covid-19 vaccine status: Completed vaccines  Qualifies for Shingles Vaccine? Yes   Zostavax completed No   Shingrix Completed?: Yes  Screening Tests Health Maintenance  Topic Date Due   TETANUS/TDAP  Never done    INFLUENZA VACCINE  05/07/2021   Pneumonia Vaccine 23+ Years old  Completed   DEXA SCAN  Completed   COVID-19 Vaccine  Completed   Zoster Vaccines- Shingrix  Completed   HPV VACCINES  Aged Out    Health Maintenance  Health Maintenance Due  Topic Date Due   TETANUS/TDAP  Never done   INFLUENZA VACCINE  05/07/2021    Colorectal cancer screening: No longer required.   Mammogram status: No longer required due to aged out.  Decline bone density at this time.   Lung Cancer Screening: (Low Dose CT Chest recommended if Age 6-80 years, 30 pack-year currently smoking OR have quit w/in 15years.) does not qualify.   Additional Screening:  Hepatitis C Screening: does not qualify; Completed   Vision Screening: Recommended annual ophthalmology exams for early detection of glaucoma and other disorders of the eye. Is the patient up to date with their annual eye exam?  No  Who is the provider or what is the name of the office in which the patient attends annual eye exams? Dr. Edilia Bo If pt is not established with a provider, would they like to be referred to a provider to establish care? No .   Dental Screening: Recommended annual dental exams for proper oral hygiene  Community Resource Referral / Chronic Care Management: CRR required this visit?  No   CCM required this visit?  No      Plan:     I have personally reviewed and noted the following in the patient's chart:   Medical and social history Use of alcohol, tobacco or illicit drugs  Current medications and supplements including opioid prescriptions.  Functional  ability and status Nutritional status Physical activity Advanced directives List of other physicians Hospitalizations, surgeries, and ER visits in previous 12 months Vitals Screenings to include cognitive, depression, and falls Referrals and appointments  In addition, I have reviewed and discussed with patient certain preventive protocols, quality metrics, and best  practice recommendations. A written personalized care plan for preventive services as well as general preventive health recommendations were provided to patient.     Lauree Chandler, NP   08/28/2021    Virtual Visit via Telephone Note  I connected [email or web address] on 08/28/21 at 11:30 AM EST by telephone and verified that I am speaking with the correct person using two identifiers.  Location: Patient: home Provider: [facility]   I discussed the limitations, risks, security and privacy concerns of performing an evaluation and management service by telephone and the availability of in person appointments. I also discussed with the patient that there may be a patient responsible charge related to this service. The patient expressed understanding and agreed to proceed.   I discussed the assessment and treatment plan with the patient. The patient was provided an opportunity to ask questions and all were answered. The patient agreed with the plan and demonstrated an understanding of the instructions.   The patient was advised to call back or seek an in-person evaluation if the symptoms worsen or if the condition fails to improve as anticipated.  I provided 14 minutes of non-face-to-face time during this encounter.  [employer]. Harle Battiest Avs printed and mailed

## 2021-08-28 NOTE — Patient Instructions (Addendum)
[name] [name] , Thank you for taking time to come for your Medicare Wellness Visit. I appreciate your ongoing commitment to your health goals. Please review the following plan we discussed and let me know if I can assist you in the future.   Screening recommendations/referrals: Colonoscopy aged out Mammogram aged out Bone Density decline  Recommended yearly ophthalmology/optometry visit for glaucoma screening and checkup Recommended yearly dental visit for hygiene and checkup  Vaccinations: Influenza vaccine- up to date Pneumococcal vaccine up to date Tdap vaccine RECOMMENDED at this time- to get at local pharmacy  Shingles vaccine  up to date  Advanced directives: recommended to bring copy to place on file.   Conditions/risks identified: advance age, hyperlipidemia, hypertension.   Next appointment: yearly    Preventive Care 85 Years and Older, Female Preventive care refers to lifestyle choices and visits with your health care provider that can promote health and wellness. What does preventive care include? A yearly physical exam. This is also called an annual well check. Dental exams once or twice a year. Routine eye exams. Ask your health care provider how often you should have your eyes checked. Personal lifestyle choices, including: Daily care of your teeth and gums. Regular physical activity. Eating a healthy diet. Avoiding tobacco and drug use. Limiting alcohol use. Practicing safe sex. Taking low-dose aspirin every day. Taking vitamin and mineral supplements as recommended by your health care provider. What happens during an annual well check? The services and screenings done by your health care provider during your annual well check will depend on your age, overall health, lifestyle risk factors, and family history of disease. Counseling  Your health care provider may ask you questions about your: Alcohol use. Tobacco use. Drug use. Emotional well-being. Home and  relationship well-being. Sexual activity. Eating habits. History of falls. Memory and ability to understand (cognition). Work and work Statistician. Reproductive health. Screening  You may have the following tests or measurements: Height, weight, and BMI. Blood pressure. Lipid and cholesterol levels. These may be checked every 5 years, or more frequently if you are over 26 years old. Skin check. Lung cancer screening. You may have this screening every year starting at age 85 if you have a 30-pack-year history of smoking and currently smoke or have quit within the past 15 years. Fecal occult blood test (FOBT) of the stool. You may have this test every year starting at age 85. Flexible sigmoidoscopy or colonoscopy. You may have a sigmoidoscopy every 5 years or a colonoscopy every 10 years starting at age 85. Hepatitis C blood test. Hepatitis B blood test. Sexually transmitted disease (STD) testing. Diabetes screening. This is done by checking your blood sugar (glucose) after you have not eaten for a while (fasting). You may have this done every 1-3 years. Bone density scan. This is done to screen for osteoporosis. You may have this done starting at age 85. Mammogram. This may be done every 1-2 years. Talk to your health care provider about how often you should have regular mammograms. Talk with your health care provider about your test results, treatment options, and if necessary, the need for more tests. Vaccines  Your health care provider may recommend certain vaccines, such as: Influenza vaccine. This is recommended every year. Tetanus, diphtheria, and acellular pertussis (Tdap, Td) vaccine. You may need a Td booster every 10 years. Zoster vaccine. You may need this after age 108. Pneumococcal 13-valent conjugate (PCV13) vaccine. One dose is recommended after age 85. Pneumococcal polysaccharide (PPSV23) vaccine.  One dose is recommended after age 85. Talk to your health care provider  about which screenings and vaccines you need and how often you need them. This information is not intended to replace advice given to you by your health care provider. Make sure you discuss any questions you have with your health care provider. Document Released: 10/20/2015 Document Revised: 06/12/2016 Document Reviewed: 07/25/2015 Elsevier Interactive Patient Education  2017 [place] Prevention in the Home Falls can cause injuries. They can happen to people of all ages. There are many things you can do to make your home safe and to help prevent falls. What can I do on the outside of my home? Regularly fix the edges of walkways and driveways and fix any cracks. Remove anything that might make you trip as you walk through a door, such as a raised step or threshold. Trim any bushes or trees on the path to your home. Use bright outdoor lighting. Clear any walking paths of anything that might make someone trip, such as rocks or tools. Regularly check to see if handrails are loose or broken. Make sure that both sides of any steps have handrails. Any raised decks and porches should have guardrails on the edges. Have any leaves, snow, or ice cleared regularly. Use sand or salt on walking paths during winter. Clean up any spills in your garage right away. This includes oil or grease spills. What can I do in the bathroom? Use night lights. Install grab bars by the toilet and in the tub and shower. Do not use towel bars as grab bars. Use non-skid mats or decals in the tub or shower. If you need to sit down in the shower, use a plastic, non-slip stool. Keep the floor dry. Clean up any water that spills on the floor as soon as it happens. Remove soap buildup in the tub or shower regularly. Attach bath mats securely with double-sided non-slip rug tape. Do not have throw rugs and other things on the floor that can make you trip. What can I do in the bedroom? Use night lights. Make sure  that you have a light by your bed that is easy to reach. Do not use any sheets or blankets that are too big for your bed. They should not hang down onto the floor. Have a firm chair that has side arms. You can use this for support while you get dressed. Do not have throw rugs and other things on the floor that can make you trip. What can I do in the kitchen? Clean up any spills right away. Avoid walking on wet floors. Keep items that you use a lot in easy-to-reach places. If you need to reach something above you, use a strong step stool that has a grab bar. Keep electrical cords out of the way. Do not use floor polish or wax that makes floors slippery. If you must use wax, use non-skid floor wax. Do not have throw rugs and other things on the floor that can make you trip. What can I do with my stairs? Do not leave any items on the stairs. Make sure that there are handrails on both sides of the stairs and use them. Fix handrails that are broken or loose. Make sure that handrails are as long as the stairways. Check any carpeting to make sure that it is firmly attached to the stairs. Fix any carpet that is loose or worn. Avoid having throw rugs at the top or bottom of  the stairs. If you do have throw rugs, attach them to the floor with carpet tape. Make sure that you have a light switch at the top of the stairs and the bottom of the stairs. If you do not have them, ask someone to add them for you. What else can I do to help prevent falls? Wear shoes that: Do not have high heels. Have rubber bottoms. Are comfortable and fit you well. Are closed at the toe. Do not wear sandals. If you use a stepladder: Make sure that it is fully opened. Do not climb a closed stepladder. Make sure that both sides of the stepladder are locked into place. Ask someone to hold it for you, if possible. Clearly mark and make sure that you can see: Any grab bars or handrails. First and last steps. Where the edge of  each step is. Use tools that help you move around (mobility aids) if they are needed. These include: Canes. Walkers. Scooters. Crutches. Turn on the lights when you go into a dark area. Replace any light bulbs as soon as they burn out. Set up your furniture so you have a clear path. Avoid moving your furniture around. If any of your floors are uneven, fix them. If there are any pets around you, be aware of where they are. Review your medicines with your doctor. Some medicines can make you feel dizzy. This can increase your chance of falling. Ask your doctor what other things that you can do to help prevent falls. This information is not intended to replace advice given to you by your health care provider. Make sure you discuss any questions you have with your health care provider. Document Released: 07/20/2009 Document Revised: 02/29/2016 Document Reviewed: 10/28/2014 Elsevier Interactive Patient Education  2017 [employer].

## 2021-08-28 NOTE — Progress Notes (Signed)
This service is provided via telemedicine  No vital signs collected/recorded due to the encounter was a telemedicine visit.   Location of patient (ex: home, work):  Home  Patient consents to a telephone visit:  Yes, see encounter dated 08/28/2021  Location of the provider (ex: office, home):  [place]  Name of any referring provider:  Ngetich, Dinah, NP  Names of all persons participating in the telemedicine service and their role in the encounter:  Sherrie Mustache, Nurse Practitioner, Carroll Kinds, CMA, and patient.   Time spent on call:  13 minutes with medical assistant

## 2021-08-29 DIAGNOSIS — H401124 Primary open-angle glaucoma, left eye, indeterminate stage: Secondary | ICD-10-CM | POA: Diagnosis not present

## 2021-08-29 DIAGNOSIS — H401113 Primary open-angle glaucoma, right eye, severe stage: Secondary | ICD-10-CM | POA: Diagnosis not present

## 2021-09-03 ENCOUNTER — Encounter: Payer: Self-pay | Admitting: Cardiology

## 2021-09-03 ENCOUNTER — Ambulatory Visit (INDEPENDENT_AMBULATORY_CARE_PROVIDER_SITE_OTHER): Payer: Medicare Other | Admitting: Cardiology

## 2021-09-03 ENCOUNTER — Other Ambulatory Visit: Payer: Self-pay

## 2021-09-03 VITALS — BP 132/66 | HR 81 | Ht 62.0 in | Wt 130.0 lb

## 2021-09-03 DIAGNOSIS — I4510 Unspecified right bundle-branch block: Secondary | ICD-10-CM

## 2021-09-03 DIAGNOSIS — T50905D Adverse effect of unspecified drugs, medicaments and biological substances, subsequent encounter: Secondary | ICD-10-CM | POA: Diagnosis not present

## 2021-09-03 DIAGNOSIS — R001 Bradycardia, unspecified: Secondary | ICD-10-CM

## 2021-09-03 DIAGNOSIS — I10 Essential (primary) hypertension: Secondary | ICD-10-CM

## 2021-09-03 NOTE — Progress Notes (Signed)
Cardiology Office Note:    Date:  09/03/2021   ID:  [name] [name], DOB Jan 09, 1924, MRN [id number]  PCP:  Sandrea Hughs, NP  Cardiologist:  Fransico Him, MD    Referring MD: Sandrea Hughs, NP   Chief Complaint  Patient presents with   Follow-up    Hypertension, bradycardia, right bundle branch block    History of Present Illness:    [name] [name] is a 87 y.o. female with a hx of HTN, asthma, chronic LE edema and bradycardia.  She is here today for followup and is doing well.  She denies any chest pain or pressure, SOB, DOE, PND, orthopnea, LE edema, dizziness, palpitations or syncope. She is compliant with her meds and is tolerating meds with no SE.      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   Edema extremities    GERD (gastroesophageal reflux disease)    Glaucoma    Macular degeneration    Per patient    Osteoarthritis    PVD (peripheral vascular disease) ([place])    Seasonal allergies    Stroke [facility])     Past Surgical History:  Procedure Laterality Date   ABDOMINAL HYSTERECTOMY  1981   TONSILLECTOMY  1953    Current Medications: Current Meds  Medication Sig   acetaminophen (TYLENOL) 500 MG tablet Take 500 mg by mouth every 6 (six) hours as needed for mild pain or moderate pain.    albuterol (PROVENTIL HFA;VENTOLIN HFA) 108 (90 Base) MCG/ACT inhaler Inhale 2 puffs into the lungs every 6 (six) hours as needed for wheezing.   alum hydroxide-mag trisilicate (GAVISCON) 40-98 MG CHEW chewable tablet Chew 2 tablets by mouth 2 (two) times daily.   amLODipine (NORVASC) 5 MG tablet Take 1 tablet (5 mg total) by mouth daily. Please keep your 09/03/21 appt with Dr. Radford Pax.   atorvastatin (LIPITOR) 10 MG tablet Take 1 tablet (10 mg total) by mouth daily.   atropine 1 % ophthalmic solution Place 1 drop into the right eye daily.    Brinzolamide-Brimonidine 1-0.2 % SUSP Apply 1 drop  to eye 3 (three) times daily.   budesonide-formoterol (SYMBICORT) 80-4.5 MCG/ACT inhaler Inhale 2 puffs into the lungs 2 (two) times daily.   Coenzyme Q10 (COQ10) 50 MG CAPS Take 50 mg by mouth daily.   docusate sodium (COLACE) 100 MG capsule TAKE 1 CAPSULE BY MOUTH TWICE A DAY   fluticasone (FLONASE) 50 MCG/ACT nasal spray Place 1 spray into the nose daily.   hydrocortisone (ANUSOL-HC) 2.5 % rectal cream Place 1 application rectally daily as needed for hemorrhoids or anal itching.   loratadine (CLARITIN) 10 MG tablet Take 10 mg by mouth daily.   Multiple Vitamins-Minerals (PRESERVISION AREDS 2) CAPS Take 1 capsule by mouth 2 (two) times daily.   Multiple Vitamins-Minerals (SENIOR MULTIVITAMIN PLUS) TABS Take 1 tablet by mouth daily.    NONFORMULARY OR COMPOUNDED ITEM 1 application 2 (two) times daily as needed. [facility]:  Peripheral Neuropathy Cream - Bupivacaine 1%, Doxepin 3%, Gabapentin 6%, Pentoxifylline 3%, Topiramate 1%, apply 1-2 grams to affected areas 3-4 times daily prn.   Omega-3 Fatty Acids (FISH OIL) 1200 MG CPDR Take 1,200 mg by mouth daily. Take one tablet by mouth once daily   prednisoLONE acetate (PRED FORTE) 1 % ophthalmic suspension Place 1 drop into the right eye daily.   risperiDONE (RISPERDAL) 0.5 MG  tablet TAKE 1 TABLET BY MOUTH TWICE A DAY   ROCKLATAN 0.02-0.005 % SOLN Place 1 drop into the left eye daily.   sodium chloride (MURO 128) 5 % ophthalmic solution Place 1 drop into the left eye 4 times daily.   vitamin C (ASCORBIC ACID) 500 MG tablet Take 500 mg by mouth daily.     Allergies:   Aspirin, Codeine, Iodine, Prednisone, Lyrica [pregabalin], Benzonatate, and Sulfa antibiotics   Social History   Socioeconomic History   Marital status: Widowed    Spouse name: Not on file   Number of children: Not on file   Years of education: Not on file   Highest education level: Not on file  Occupational History   Not on file  Tobacco Use   Smoking status: Never    Smokeless tobacco: Never  Vaping Use   Vaping Use: Never used  Substance and Sexual Activity   Alcohol use: No    Alcohol/week: 0.0 standard drinks   Drug use: No   Sexual activity: Not Currently  Other Topics Concern   Not on file  Social History Narrative   Was cosmetologist, 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  ROS:   Please see the history of present illness.    ROS  All other systems reviewed and negative.   EKGs/Labs/Other Studies Reviewed:    The following studies were reviewed today:  none  EKG:  EKG is ordered today and demonstrates NSR with RBBB  Recent Labs: 04/23/2021: ALT 13; BUN 13; Creat 0.78; Hemoglobin 11.4; Platelets 204; Potassium 4.2; Sodium 141; TSH 1.97   Recent Lipid Panel    Component Value Date/Time   CHOL 122 04/23/2021 0000   CHOL 187 08/17/2013 0836   TRIG 52 04/23/2021 0000   HDL 62 04/23/2021 0000   HDL 105 08/17/2013 0836   CHOLHDL 2.0 04/23/2021 0000   VLDL 9 03/24/2017 1200   LDLCALC 47 04/23/2021 0000    Physical Exam:    VS:  BP 132/66   Pulse 81   Ht 5\' 2"  (1.575 m)   Wt 130 lb (59 kg) Comment: Patient couldn't stand on scale so stated weight.  SpO2 98%   BMI 23.78 kg/m     Wt Readings from Last 3 Encounters:  09/03/21 130 lb (59 kg)  04/23/21 135 lb (61.2 kg)  10/05/20 133 lb 1.6 oz (60.4 kg)    GEN: Well nourished, well developed in no acute distress HEENT: Normal NECK: No JVD; No carotid bruits LYMPHATICS: No lymphadenopathy CARDIAC:RRR, no murmurs, rubs, gallops RESPIRATORY:  Clear  to auscultation without rales, wheezing or rhonchi  ABDOMEN: Soft, non-tender, non-distended MUSCULOSKELETAL:  No edema; No deformity  SKIN: Warm and dry NEUROLOGIC:  Alert and oriented x 3 PSYCHIATRIC:  Normal affect   ASSESSMENT:    1. Essential hypertension   2. Bradycardia, drug induced   3. RBBB    PLAN:    In order of problems listed above:  1.  HTN  -BP is well controlled on exam today  -  Continue prescription drug management with amlodipine 5 mg daily with as needed refills   2.  Bradycardia  -resolved on EKG today  -She remains asymptomatic with no dizziness, presyncope or syncope  3.  Right bundle branch block -She denies any chest pain or shortness of breath   Medication Adjustments/Labs and Tests Ordered: Current medicines are reviewed at length with the patient today.  Concerns regarding medicines are outlined above.  Orders Placed This Encounter  Procedures   EKG 12-Lead    No orders of the defined types were placed in this encounter.   Signed, Fransico Him, MD  09/03/2021 4:20 PM    [facility]

## 2021-09-03 NOTE — Patient Instructions (Signed)

## 2021-09-13 ENCOUNTER — Other Ambulatory Visit: Payer: Self-pay | Admitting: Family

## 2021-10-01 ENCOUNTER — Other Ambulatory Visit: Payer: Self-pay | Admitting: Cardiology

## 2021-10-24 ENCOUNTER — Ambulatory Visit: Payer: Medicare Other | Admitting: Family

## 2021-10-31 ENCOUNTER — Other Ambulatory Visit: Payer: Self-pay | Admitting: *Deleted

## 2021-10-31 MED ORDER — RISPERIDONE 0.5 MG PO TABS: 0.5000 mg | ORAL_TABLET | Freq: Two times a day (BID) | ORAL | 1 refills | Status: DC

## 2021-10-31 NOTE — Telephone Encounter (Signed)
Pharmacy requested refill.  Pended Rx and sent to Dinah for approval due to HIGH ALERT Warning.  

## 2021-11-01 ENCOUNTER — Encounter: Payer: Self-pay | Admitting: Family

## 2021-11-01 ENCOUNTER — Other Ambulatory Visit: Payer: Self-pay

## 2021-11-01 ENCOUNTER — Ambulatory Visit (INDEPENDENT_AMBULATORY_CARE_PROVIDER_SITE_OTHER): Payer: Medicare Other | Admitting: Family

## 2021-11-01 VITALS — BP 130/70 | HR 62 | Temp 96.5°F | Resp 16 | Ht 62.0 in

## 2021-11-01 DIAGNOSIS — F22 Delusional disorders: Secondary | ICD-10-CM | POA: Diagnosis not present

## 2021-11-01 DIAGNOSIS — E785 Hyperlipidemia, unspecified: Secondary | ICD-10-CM | POA: Diagnosis not present

## 2021-11-01 DIAGNOSIS — I10 Essential (primary) hypertension: Secondary | ICD-10-CM

## 2021-11-01 DIAGNOSIS — K219 Gastro-esophageal reflux disease without esophagitis: Secondary | ICD-10-CM | POA: Diagnosis not present

## 2021-11-01 DIAGNOSIS — J4520 Mild intermittent asthma, uncomplicated: Secondary | ICD-10-CM

## 2021-11-01 DIAGNOSIS — G629 Polyneuropathy, unspecified: Secondary | ICD-10-CM

## 2021-11-01 DIAGNOSIS — Z8679 Personal history of other diseases of the circulatory system: Secondary | ICD-10-CM | POA: Diagnosis not present

## 2021-11-01 DIAGNOSIS — I739 Peripheral vascular disease, unspecified: Secondary | ICD-10-CM | POA: Diagnosis not present

## 2021-11-01 DIAGNOSIS — R2681 Unsteadiness on feet: Secondary | ICD-10-CM

## 2021-11-01 NOTE — Progress Notes (Signed)
Provider: Marlowe Sax FNP-C   Alize Borrayo, Nelda Bucks, NP  Patient Care Team: Marisel Tostenson, Nelda Bucks, NP as PCP - General (Family Medicine) Sueanne Margarita, MD as PCP - Cardiology (Cardiology) Trula Slade, DPM as Consulting Physician (Podiatry) Bond, Tracie Harrier, MD as Referring Physician (Ophthalmology)  Extended Emergency Contact Information Primary Emergency Contact: Macdonell,Carl          [place] [address] [place] Phone: [phone] Mobile Phone: [phone] Relation: Son Secondary Emergency Contact: Stormy Fabian [place] Phone: [phone] Mobile Phone: [phone] Relation: Daughter  Code Status:  Full Code  Goals of care: Advanced Directive information Advanced Directives 11/01/2021  Does Patient Have a Medical Advance Directive? No  Type of Advance Directive -  Does patient want to make changes to medical advance directive? -  Copy of [place] in Chart? -  Would patient like information on creating a medical advance directive? No - Patient declined  Pre-existing out of facility DNR order (yellow form or pink MOST form) -     Chief Complaint  Patient presents with   Medical Management of Chronic Issues    6 month follow up.   Immunizations    Discuss the need for Tetanus vaccine.    HPI:  Pt is a 88 y.o. female seen today for 6 months follow up for medical management of chronic diseases. She denies any acute issues today. States had her COVID-19 booster and Flu shot at the pharmacy though [place] called pharmacy last Influenza vaccine was given 2020   Hyperlipidemia - previous LDL at goal on lipitor .denies any muscle aches or weakness. States previous skin lesion on the back was frozen by dermatologist.  Due for fasting labs but has eaten today.will come for fasting labs in one week. Transportation is any issues per son has to pay 100 dollars for visit.does not like to use [employer] bus.    Past  Medical History:  Diagnosis Date   Anxiety    Asthma    Benign essential hypertension    Bradycardia, drug induced 06/11/2016   Edema extremities    GERD (gastroesophageal reflux disease)    Glaucoma    Macular degeneration    Per patient    Osteoarthritis    PVD (peripheral vascular disease) ([place])    Seasonal allergies    Stroke [facility])    Past Surgical History:  Procedure Laterality Date   ABDOMINAL HYSTERECTOMY  1981   TONSILLECTOMY  1953    Allergies  Allergen Reactions   Aspirin Other (See Comments)    sweats   Codeine Other (See Comments)   Iodine Other (See Comments)    Pt is not aware of this allergy, does not recall much info.   Prednisone Other (See Comments)    sweats   Lyrica [Pregabalin]     Wobbly, "more than sleepy"   Benzonatate Itching and Rash   Sulfa Antibiotics Other (See Comments)    Doesn't remember    Allergies as of 11/01/2021       Reactions   Aspirin Other (See Comments)   sweats   Codeine Other (See Comments)   Iodine Other (See Comments)   Pt is not aware of this allergy, does not recall much info.   Prednisone Other (See Comments)   sweats   Lyrica [pregabalin]    Wobbly, "more than sleepy"   Benzonatate Itching, Rash   Sulfa Antibiotics Other (See Comments)   Doesn't remember  Medication List        Accurate as of November 01, 2021  2:32 PM. If you have any questions, ask your nurse or doctor.          acetaminophen 500 MG tablet Commonly known as: TYLENOL Take 500 mg by mouth every 6 (six) hours as needed for mild pain or moderate pain.   albuterol 108 (90 Base) MCG/ACT inhaler Commonly known as: VENTOLIN HFA Inhale 2 puffs into the lungs every 6 (six) hours as needed for wheezing.   alum hydroxide-mag trisilicate 68-12 MG Chew chewable tablet Commonly known as: GAVISCON Chew 2 tablets by mouth 2 (two) times daily.   amLODipine 5 MG tablet Commonly known as: NORVASC TAKE 1 TABLET (5 MG TOTAL) BY MOUTH  DAILY. PLEASE KEEP YOUR 09/03/21 APPT WITH DR. Radford Pax.   atorvastatin 10 MG tablet Commonly known as: LIPITOR TAKE 1 TABLET BY MOUTH EVERY DAY   atropine 1 % ophthalmic solution Place 1 drop into the right eye daily.   Brinzolamide-Brimonidine 1-0.2 % Susp Apply 1 drop to eye 3 (three) times daily.   budesonide-formoterol 80-4.5 MCG/ACT inhaler Commonly known as: SYMBICORT Inhale 2 puffs into the lungs 2 (two) times daily.   CoQ10 50 MG Caps Take 50 mg by mouth daily.   docusate sodium 100 MG capsule Commonly known as: COLACE TAKE 1 CAPSULE BY MOUTH TWICE A DAY   Fish Oil 1200 MG Cpdr Take 1,200 mg by mouth daily. Take one tablet by mouth once daily   fluticasone 50 MCG/ACT nasal spray Commonly known as: FLONASE Place 1 spray into the nose daily.   hydrocortisone 2.5 % rectal cream Commonly known as: Anusol-HC Place 1 application rectally daily as needed for hemorrhoids or anal itching.   loratadine 10 MG tablet Commonly known as: CLARITIN Take 10 mg by mouth daily.   NONFORMULARY OR COMPOUNDED ITEM 1 application 2 (two) times daily as needed. [place]:  Peripheral Neuropathy Cream - Bupivacaine 1%, Doxepin 3%, Gabapentin 6%, Pentoxifylline 3%, Topiramate 1%, apply 1-2 grams to affected areas 3-4 times daily prn.   prednisoLONE acetate 1 % ophthalmic suspension Commonly known as: PRED FORTE Place 1 drop into the right eye daily.   risperiDONE 0.5 MG tablet Commonly known as: RISPERDAL Take 1 tablet (0.5 mg total) by mouth 2 (two) times daily.   Rocklatan 0.02-0.005 % Soln Generic drug: Netarsudil-Latanoprost Place 1 drop into the left eye daily.   PreserVision AREDS 2 Caps Take 1 capsule by mouth 2 (two) times daily.   Senior Multivitamin Plus Tabs Take 1 tablet by mouth daily.   sodium chloride 5 % ophthalmic solution Commonly known as: MURO 128 Place 1 drop into the left eye 4 times daily.   vitamin C 500 MG tablet Commonly known as:  ASCORBIC ACID Take 500 mg by mouth daily.        Review of Systems  Constitutional:  Negative for appetite change, chills, fatigue, fever and unexpected weight change.  HENT:  Negative for congestion, dental problem, ear discharge, ear pain, facial swelling, hearing loss, nosebleeds, postnasal drip, rhinorrhea, sinus pressure, sinus pain, sneezing, sore throat, tinnitus and trouble swallowing.   Eyes:  Positive for visual disturbance. Negative for pain, discharge, redness and itching.       Macular degeneration   Respiratory:  Negative for cough, chest tightness, shortness of breath and wheezing.   Cardiovascular:  Negative for chest pain, palpitations and leg swelling.  Gastrointestinal:  Negative for abdominal distention, abdominal pain, blood in  stool, constipation, diarrhea, nausea and vomiting.  Endocrine: Negative for cold intolerance, heat intolerance, polydipsia, polyphagia and polyuria.  Genitourinary:  Negative for difficulty urinating, dysuria, flank pain, frequency and urgency.  Musculoskeletal:  Positive for gait problem. Negative for arthralgias, back pain, joint swelling, myalgias, neck pain and neck stiffness.  Skin:  Negative for color change, pallor, rash and wound.  Neurological:  Negative for dizziness, syncope, speech difficulty, weakness, light-headedness, numbness and headaches.  Hematological:  Does not bruise/bleed easily.  Psychiatric/Behavioral:  Negative for agitation, behavioral problems, confusion, hallucinations, self-injury, sleep disturbance and suicidal ideas. The patient is not nervous/anxious.    Immunization History  Administered Date(s) Administered   Fluad Quad(high Dose 65+) 06/04/2019, 07/21/2020   Influenza, High Dose Seasonal PF 10/19/2013, 08/02/2014, 08/11/2015, 11/22/2016, 07/16/2017, 11/21/2017, 07/20/2018, 11/19/2018, 11/18/2019, 11/17/2020   Influenza,inj,Quad PF,6+ Mos 06/11/2013, 06/30/2014, 07/17/2015, 06/27/2016   Influenza-Unspecified  07/07/2012   PFIZER(Purple Top)SARS-COV-2 Vaccination 11/20/2019, 12/13/2019, 09/20/2020, 11/17/2020   Pfizer Covid-19 Vaccine Bivalent Booster 32yr & up 07/18/2021   Pneumococcal Conjugate-13 11/07/2014   Pneumococcal Polysaccharide-23 10/08/2007, 08/11/2015, 11/22/2016, 11/21/2017, 11/19/2018, 11/18/2019, 11/17/2020   Zoster Recombinat (Shingrix) 07/31/2017, 12/09/2017   Pertinent  Health Maintenance Due  Topic Date Due   INFLUENZA VACCINE  05/07/2021   DEXA SCAN  Completed   Fall Risk 08/25/2020 10/05/2020 02/09/2021 08/28/2021 11/01/2021  Falls in the past year? 1 0 1 1 0  Was there an injury with Fall? 0 0 1 0 0  Fall Risk Category Calculator 1 0 2 2 0  Fall Risk Category Low Low Moderate Moderate Low  Patient Fall Risk Level Low fall risk Low fall risk Moderate fall risk Moderate fall risk Low fall risk  Patient at Risk for Falls Due to - - - History of fall(s) No Fall Risks  Fall risk Follow up - - - Falls evaluation completed Falls evaluation completed   Functional Status Survey:    Vitals:   11/01/21 1428  BP: 130/70  Pulse: 62  Resp: 16  Temp: (!) 96.5 F (35.8 C)  SpO2: 98%  Height: 5' 2"  (1.575 m)   Body mass index is 23.78 kg/m. Physical Exam Vitals reviewed.  Constitutional:      General: She is not in acute distress.    Appearance: Normal appearance. She is normal weight. She is not ill-appearing or diaphoretic.  HENT:     Head: Normocephalic.     Right Ear: Tympanic membrane, ear canal and external ear normal. There is no impacted cerumen.     Left Ear: There is impacted cerumen.     Nose: Nose normal. No congestion or rhinorrhea.     Mouth/Throat:     Mouth: Mucous membranes are moist.     Pharynx: Oropharynx is clear. No oropharyngeal exudate or posterior oropharyngeal erythema.  Eyes:     General: No scleral icterus.       Right eye: No discharge.        Left eye: No discharge.     Extraocular Movements: Extraocular movements intact.      Conjunctiva/sclera: Conjunctivae normal.     Pupils: Pupils are equal, round, and reactive to light.  Neck:     Vascular: No carotid bruit.  Cardiovascular:     Rate and Rhythm: Normal rate and regular rhythm.     Pulses: Normal pulses.     Heart sounds: Normal heart sounds. No murmur heard.   No friction rub. No gallop.  Pulmonary:     Effort: Pulmonary effort is normal. No  respiratory distress.     Breath sounds: Normal breath sounds. No wheezing, rhonchi or rales.  Chest:     Chest wall: No tenderness.  Abdominal:     General: Bowel sounds are normal. There is no distension.     Palpations: Abdomen is soft. There is no mass.     Tenderness: There is no abdominal tenderness. There is no right CVA tenderness, left CVA tenderness, guarding or rebound.  Musculoskeletal:        General: No swelling or tenderness. Normal range of motion.     Cervical back: Normal range of motion. No rigidity or tenderness.     Right lower leg: No edema.     Left lower leg: No edema.     Comments: On wheel chair during visit   Lymphadenopathy:     Cervical: No cervical adenopathy.  Skin:    General: Skin is warm and dry.     Coloration: Skin is not pale.     Findings: No bruising, erythema, lesion or rash.  Neurological:     Mental Status: She is alert and oriented to person, place, and time.     Cranial Nerves: No cranial nerve deficit.     Sensory: No sensory deficit.     Motor: No weakness.     Coordination: Coordination normal.     Gait: Gait abnormal.  Psychiatric:        Mood and Affect: Mood normal.        Speech: Speech normal.        Behavior: Behavior normal.        Thought Content: Thought content normal.        Judgment: Judgment normal.    Labs reviewed: Recent Labs    02/09/21 1434 04/23/21 0000  NA 139 141  K 3.9 4.2  CL 104 109  CO2 24 26  GLUCOSE 119 98  BUN 14 13  CREATININE 0.83 0.78  CALCIUM 9.8 9.3   Recent Labs    04/23/21 0000  AST 17  ALT 13  BILITOT  0.6  PROT 6.5   Recent Labs    02/09/21 1434 04/23/21 0000  WBC 7.8 4.7  NEUTROABS 6,614 2,557  HGB 12.8 11.4*  HCT 38.1 35.2  MCV 96.0 97.2  PLT 186 204   Lab Results  Component Value Date   TSH 1.97 04/23/2021   Lab Results  Component Value Date   HGBA1C 5.6 04/22/2016   Lab Results  Component Value Date   CHOL 122 04/23/2021   HDL 62 04/23/2021   LDLCALC 47 04/23/2021   TRIG 52 04/23/2021   CHOLHDL 2.0 04/23/2021    Significant Diagnostic Results in last 30 days:  No results found.  Assessment/Plan 1. Essential hypertension B/p well controlled. - continue on amlodipine  - CBC with Differential/Platelet; Future - CMP with eGFR([facility]); Future  2. Hyperlipidemia, unspecified hyperlipidemia type Previous LDL at goal  Continue on atorvastatin and Omega -3  - Lipid panel; Future  3. Gastroesophageal reflux disease without esophagitis asymptomatic  Previous Hgb slightly low will recheck lab  - CBC with Differential/Platelet; Future  4. Mild intermittent asthma without complication Symptoms stable on Symbicort and Albuterol does not require albuterol when she uses her Symbicort regularly.  5. PVD (peripheral vascular disease) ([place]) Continue on Statin  Not on anticoagulant or antiplatelet due to advance age and high risk for falls   6. History of cerebrovascular disease Continue to control high risk factors   7. Peripheral polyneuropathy Continue  current pain regimen   8. Unsteady gait Fall and safety precaution advised. On wheelchair during visit   9. Paranoid type delusional disorder ([place]) Continue on Risperidone   Family/ staff Communication: Reviewed plan of care with patient and son verbalized understanding   Labs/tests ordered:  - CBC with Differential/Platelet - CMP with eGFR([facility]) - Lipid panel  Next Appointment : 6 months for medical management of chronic issues.Fasting labs in one week    Sandrea Hughs, NP

## 2021-11-08 ENCOUNTER — Other Ambulatory Visit: Payer: Medicare Other

## 2021-11-13 ENCOUNTER — Other Ambulatory Visit: Payer: Medicare Other

## 2021-11-13 ENCOUNTER — Other Ambulatory Visit: Payer: Self-pay

## 2021-11-13 DIAGNOSIS — E785 Hyperlipidemia, unspecified: Secondary | ICD-10-CM

## 2021-11-13 DIAGNOSIS — I10 Essential (primary) hypertension: Secondary | ICD-10-CM

## 2021-11-13 DIAGNOSIS — K219 Gastro-esophageal reflux disease without esophagitis: Secondary | ICD-10-CM

## 2021-11-13 LAB — CBC WITH DIFFERENTIAL/PLATELET
Absolute Monocytes: 545 cells/uL (ref 200–950)
Basophils Absolute: 38 cells/uL (ref 0–200)
Basophils Relative: 0.7 %
Eosinophils Absolute: 59 cells/uL (ref 15–500)
Eosinophils Relative: 1.1 %
HCT: 36.2 % (ref 35.0–45.0)
Hemoglobin: 12.2 g/dL (ref 11.7–15.5)
Lymphs Abs: 2079 cells/uL (ref 850–3900)
MCH: 32.4 pg (ref 27.0–33.0)
MCHC: 33.7 g/dL (ref 32.0–36.0)
MCV: 96.3 fL (ref 80.0–100.0)
MPV: 11 fL (ref 7.5–12.5)
Monocytes Relative: 10.1 %
Neutro Abs: 2678 cells/uL (ref 1500–7800)
Neutrophils Relative %: 49.6 %
Platelets: 226 10*3/uL (ref 140–400)
RBC: 3.76 10*6/uL — ABNORMAL LOW (ref 3.80–5.10)
RDW: 13.3 % (ref 11.0–15.0)
Total Lymphocyte: 38.5 %
WBC: 5.4 10*3/uL (ref 3.8–10.8)

## 2021-11-13 LAB — LIPID PANEL
Cholesterol: 135 mg/dL (ref <200)
HDL: 66 mg/dL (ref >=50)
LDL Cholesterol (Calc): 56 mg/dL (calc)
Non-HDL Cholesterol (Calc): 69 mg/dL (calc) (ref <130)
Total CHOL/HDL Ratio: 2 (calc) (ref <5.0)
Triglycerides: 46 mg/dL (ref <150)

## 2021-11-13 LAB — COMPLETE METABOLIC PANEL WITH GFR
AG Ratio: 1.2 (calc) (ref 1.0–2.5)
ALT: 21 U/L (ref 6–29)
AST: 23 U/L (ref 10–35)
Albumin: 3.7 g/dL (ref 3.6–5.1)
Alkaline phosphatase (APISO): 73 U/L (ref 37–153)
BUN: 16 mg/dL (ref 7–25)
CO2: 26 mmol/L (ref 20–32)
Calcium: 9.5 mg/dL (ref 8.6–10.4)
Chloride: 107 mmol/L (ref 98–110)
Creat: 0.75 mg/dL (ref 0.60–0.95)
Globulin: 3.1 g/dL (calc) (ref 1.9–3.7)
Glucose, Bld: 107 mg/dL (ref 65–139)
Potassium: 3.6 mmol/L (ref 3.5–5.3)
Sodium: 140 mmol/L (ref 135–146)
Total Bilirubin: 1 mg/dL (ref 0.2–1.2)
Total Protein: 6.8 g/dL (ref 6.1–8.1)
eGFR: 73 mL/min/{1.73_m2} (ref >=60)

## 2021-11-21 ENCOUNTER — Other Ambulatory Visit: Payer: Self-pay

## 2021-11-21 ENCOUNTER — Ambulatory Visit (INDEPENDENT_AMBULATORY_CARE_PROVIDER_SITE_OTHER): Payer: Medicare Other | Admitting: Podiatry

## 2021-11-21 ENCOUNTER — Encounter: Payer: Self-pay | Admitting: Podiatry

## 2021-11-21 DIAGNOSIS — M79675 Pain in left toe(s): Secondary | ICD-10-CM | POA: Diagnosis not present

## 2021-11-21 DIAGNOSIS — M79674 Pain in right toe(s): Secondary | ICD-10-CM

## 2021-11-21 DIAGNOSIS — B351 Tinea unguium: Secondary | ICD-10-CM

## 2021-11-27 NOTE — Progress Notes (Signed)
Subjective: [name] [name] is a 88 y.o. female patient seen today for follow up of  at risk foot care. Patient has h/o PAD and neuropathy and painful elongated mycotic toenails 1-5 bilaterally which are tender when wearing enclosed shoe gear. Pain is relieved with periodic professional debridement..   Patient's son, [name] [name], states he applied antibiotic ointment to left great toe as instructed. He norm Ms. Nanna voice any new concerns.  PCP is [name] [name], [name] Bucks, NP. Last visit was: 11/01/2021.  Allergies  Allergen Reactions   Aspirin Other (See Comments)    sweats   Codeine Other (See Comments)   Iodine Other (See Comments)    Pt is not aware of this allergy, does not recall much info.   Prednisone Other (See Comments)    sweats   Lyrica [Pregabalin]     Wobbly, "more than sleepy"   Benzonatate Itching and Rash   Sulfa Antibiotics Other (See Comments)    Doesn't remember    Objective: Physical Exam  General: Patient is a pleasant 88 y.o. African American female WD, WN in NAD. AAO x 3.   Neurovascular Examination: CFT <3 seconds b/l LE. Palpable DP pulse(s) b/l LE. Palpable PT pulse(s) b/l LE. Pedal hair absent. No pain with calf compression b/l. Lower extremity skin temperature gradient within normal limits. Dependent edema noted b/l LE. No cyanosis or clubbing noted b/l LE.  Pt has subjective symptoms of neuropathy. Protective sensation intact 5/5 intact bilaterally with 10g monofilament b/l.  Dermatological:  Pedal integument with normal turgor, texture and tone BLE. No open wounds b/l LE. No interdigital macerations noted b/l LE. Toenails 1-5 b/l elongated, discolored, dystrophic, thickened, crumbly with subungual debris and tenderness to dorsal palpation. Incurvated nailplate medial border(s) R hallux.  Nail border hypertrophy minimal. There is tenderness to palpation. Sign(s) of infection: no clinical signs of infection noted on examination today..  Musculoskeletal:  Muscle  strength 5/5 to all lower extremity muscle groups bilaterally. No pain, crepitus or joint limitation noted with ROM bilateral LE. Hammertoe(s) noted to the L 4th toe. Utilizes wheelchair for mobility assistance.  Assessment: 1. Pain due to onychomycosis of toenails of both feet    Plan: Patient was evaluated and treated and all questions answered. Consent given for treatment as described below: -Mycotic toenails 1-5 bilaterally were debrided in length and girth with sterile nail nippers and dremel without incident. -Offending nail border debrided and curretaged R hallux utilizing sterile nail nipper and currette. Border(s) cleansed with alcohol and triple antibiotic ointment applied. Patient's son, [name] [name], instructed to apply Neosporin Cream  to R hallux once daily. -Patient/POA to call should there be question/concern in the interim.  Return in about 3 months (around 02/18/2022).  [name] [name], DPM

## 2021-11-28 DIAGNOSIS — J4540 Moderate persistent asthma, uncomplicated: Secondary | ICD-10-CM | POA: Diagnosis not present

## 2021-11-28 DIAGNOSIS — J3089 Other allergic rhinitis: Secondary | ICD-10-CM | POA: Diagnosis not present

## 2021-11-28 DIAGNOSIS — J300 Vasomotor rhinitis: Secondary | ICD-10-CM | POA: Diagnosis not present

## 2022-01-09 ENCOUNTER — Other Ambulatory Visit: Payer: Self-pay | Admitting: Cardiology

## 2022-01-30 DIAGNOSIS — H401113 Primary open-angle glaucoma, right eye, severe stage: Secondary | ICD-10-CM | POA: Diagnosis not present

## 2022-01-30 DIAGNOSIS — H401124 Primary open-angle glaucoma, left eye, indeterminate stage: Secondary | ICD-10-CM | POA: Diagnosis not present

## 2022-02-05 ENCOUNTER — Ambulatory Visit (INDEPENDENT_AMBULATORY_CARE_PROVIDER_SITE_OTHER): Payer: Medicare Other | Admitting: Family

## 2022-02-05 ENCOUNTER — Encounter: Payer: Self-pay | Admitting: Family

## 2022-02-05 VITALS — BP 120/68 | HR 57 | Temp 96.9°F | Resp 20 | Ht 64.0 in

## 2022-02-05 DIAGNOSIS — R2681 Unsteadiness on feet: Secondary | ICD-10-CM

## 2022-02-05 DIAGNOSIS — M170 Bilateral primary osteoarthritis of knee: Secondary | ICD-10-CM

## 2022-02-05 DIAGNOSIS — R29898 Other symptoms and signs involving the musculoskeletal system: Secondary | ICD-10-CM | POA: Diagnosis not present

## 2022-02-05 DIAGNOSIS — L853 Xerosis cutis: Secondary | ICD-10-CM

## 2022-02-05 MED ORDER — CETAPHIL MOISTURIZING EX CREA: TOPICAL_CREAM | Freq: Two times a day (BID) | CUTANEOUS | 0 refills | Status: DC

## 2022-02-05 NOTE — Progress Notes (Signed)
? ?Provider: Marlowe Sax FNP-C ? ?Beryl Hornberger, Nelda Bucks, NP ? ?Patient Care Team: ?Delissa Silba, Nelda Bucks, NP as PCP - General (Family Medicine) ?Sueanne Margarita, MD as PCP - Cardiology (Cardiology) ?Trula Slade, DPM as Consulting Physician (Podiatry) ?Rondel Oh, MD as Referring Physician (Ophthalmology) ? ?Extended Emergency Contact Information ?Primary Emergency Contact: Mccarthy,Carl ?         [place] [address] [place] ?Home Phone: [phone] ?Mobile Phone: [phone] ?Relation: Son ?Secondary Emergency Contact: Headen,Sandra ? [place] ?Home Phone: [phone] ?Mobile Phone: [phone] ?Relation: Daughter ? ?Code Status:  DNR ?Goals of care: Advanced Directive information ? ?  11/01/2021  ?  2:29 PM  ?Advanced Directives  ?Does Patient Have a Medical Advance Directive? No  ?Would patient like information on creating a medical advance directive? No - Patient declined  ? ? ? ?Chief Complaint  ?Patient presents with  ? Acute Visit  ?  Patient is here for a referral for PT and neuropathy and tingling in all limbs  ? ? ?HPI:  ?Pt is a 88 y.o. female seen today for an acute visit for evaluation for Home health Physical Therapy.states bilateral leg weakness and both knee pain due to arthritis.Has a rolling walker that she uses and wheelchair for long distance. ?No recent fall episode.she denies any fever,chills,nausea,vomiting,abdominal pain,flank pain,urgency,frequency,dysuria,difficult urination or hematuria.Also denies She denies any cough,fatigue,body aches,runny nose,chest tightness,chest pain,palpitation or shortness of breath. ? ? ? ? ?Past Medical History:  ?Diagnosis Date  ? Anxiety   ? Asthma   ? Benign essential hypertension   ? Bradycardia, drug induced 06/11/2016  ? Edema extremities   ? GERD (gastroesophageal reflux disease)   ? Glaucoma   ? Macular degeneration   ? Per patient   ? Osteoarthritis   ? PVD (peripheral vascular disease) ([place])   ? Seasonal allergies    ? Stroke [facility])   ? ?Past Surgical History:  ?Procedure Laterality Date  ? ABDOMINAL HYSTERECTOMY  1981  ? TONSILLECTOMY  1953  ? ? ?Allergies  ?Allergen Reactions  ? Aspirin Other (See Comments)  ?  sweats  ? Codeine Other (See Comments)  ? Iodine Other (See Comments)  ?  Pt is not aware of this allergy, does not recall much info.  ? Prednisone Other (See Comments)  ?  sweats  ? Lyrica [Pregabalin]   ?  Wobbly, "more than sleepy"  ? Benzonatate Itching and Rash  ? Sulfa Antibiotics Other (See Comments)  ?  Doesn't remember  ? ? ?Outpatient Encounter Medications as of 02/05/2022  ?Medication Sig  ? acetaminophen (TYLENOL) 500 MG tablet Take 500 mg by mouth every 6 (six) hours as needed for mild pain or moderate pain.   ? albuterol (PROVENTIL HFA;VENTOLIN HFA) 108 (90 Base) MCG/ACT inhaler Inhale 2 puffs into the lungs every 6 (six) hours as needed for wheezing.  ? alum hydroxide-mag trisilicate (GAVISCON) 71-06 MG CHEW chewable tablet Chew 2 tablets by mouth 2 (two) times daily.  ? amLODipine (NORVASC) 5 MG tablet Take 1 tablet (5 mg total) by mouth daily. Please keep your 09/03/21 appt with Dr. Radford Pax.  ? atorvastatin (LIPITOR) 10 MG tablet TAKE 1 TABLET BY MOUTH EVERY DAY  ? atropine 1 % ophthalmic solution Place 1 drop into the right eye daily.   ? Brinzolamide-Brimonidine 1-0.2 % SUSP Apply 1 drop to eye 3 (three) times daily.  ? budesonide-formoterol (SYMBICORT) 80-4.5 MCG/ACT inhaler Inhale 2 puffs into the lungs 2 (two) times daily.  ?  clopidogrel (PLAVIX) 75 MG tablet Take 75 mg by mouth daily.  ? Coenzyme Q10 (COQ10) 50 MG CAPS Take 50 mg by mouth daily.  ? docusate sodium (COLACE) 100 MG capsule TAKE 1 CAPSULE BY MOUTH TWICE A DAY  ? fluticasone (FLONASE) 50 MCG/ACT nasal spray Place 1 spray into the nose daily.  ? hydrocortisone (ANUSOL-HC) 2.5 % rectal cream Place 1 application rectally daily as needed for hemorrhoids or anal itching.  ? loratadine (CLARITIN) 10 MG tablet Take 10 mg by mouth daily.  ?  Multiple Vitamins-Minerals (PRESERVISION AREDS 2) CAPS Take 1 capsule by mouth 2 (two) times daily.  ? Multiple Vitamins-Minerals (SENIOR MULTIVITAMIN PLUS) TABS Take 1 tablet by mouth daily.   ? NONFORMULARY OR COMPOUNDED ITEM 1 application 2 (two) times daily as needed. [facility]:  Peripheral Neuropathy Cream - Bupivacaine 1%, Doxepin 3%, Gabapentin 6%, Pentoxifylline 3%, Topiramate 1%, apply 1-2 grams to affected areas 3-4 times daily prn.  ? Omega-3 Fatty Acids (FISH OIL) 1200 MG CPDR Take 1,200 mg by mouth daily. Take one tablet by mouth once daily  ? prednisoLONE acetate (PRED FORTE) 1 % ophthalmic suspension Place 1 drop into the right eye daily.  ? risperiDONE (RISPERDAL) 0.5 MG tablet Take 1 tablet (0.5 mg total) by mouth 2 (two) times daily.  ? ROCKLATAN 0.02-0.005 % SOLN Place 1 drop into the left eye daily.  ? sodium chloride (MURO 128) 5 % ophthalmic solution Place 1 drop into the left eye 4 times daily.  ? vitamin C (ASCORBIC ACID) 500 MG tablet Take 500 mg by mouth daily.  ? ?No facility-administered encounter medications on file as of 02/05/2022.  ? ? ?Review of Systems  ?Constitutional:  Negative for appetite change, chills, fatigue, fever and unexpected weight change.  ?HENT:  Negative for congestion, dental problem, ear discharge, ear pain, facial swelling, hearing loss, nosebleeds, postnasal drip, rhinorrhea, sinus pressure, sinus pain, sneezing, sore throat, tinnitus and trouble swallowing.   ?Eyes:  Negative for pain, discharge, redness, itching and visual disturbance.  ?Respiratory:  Negative for cough, chest tightness, shortness of breath and wheezing.   ?Cardiovascular:  Negative for chest pain, palpitations and leg swelling.  ?Gastrointestinal:  Negative for abdominal distention, abdominal pain, blood in stool, constipation, diarrhea, nausea and vomiting.  ?Endocrine: Negative for cold intolerance, heat intolerance, polydipsia, polyphagia and polyuria.  ?Genitourinary:  Negative for  difficulty urinating, dysuria, flank pain, frequency and urgency.  ?Musculoskeletal:  Positive for arthralgias and gait problem. Negative for back pain, joint swelling, myalgias, neck pain and neck stiffness.  ?     Ostearthritis on both knees   ?Skin:  Negative for color change, pallor, rash and wound.  ?Neurological:  Negative for dizziness, syncope, speech difficulty, weakness, light-headedness, numbness and headaches.  ?Hematological:  Does not bruise/bleed easily.  ?Psychiatric/Behavioral:  Negative for agitation, behavioral problems, confusion, hallucinations, self-injury, sleep disturbance and suicidal ideas. The patient is not nervous/anxious.   ? ?Immunization History  ?Administered Date(s) Administered  ? Fluad Quad(high Dose 65+) 06/04/2019, 07/21/2020  ? Influenza, High Dose Seasonal PF 10/19/2013, 08/02/2014, 08/11/2015, 11/22/2016, 07/16/2017, 11/21/2017, 07/20/2018, 11/19/2018, 11/18/2019, 11/17/2020  ? Influenza,inj,Quad PF,6+ Mos 06/11/2013, 06/30/2014, 07/17/2015, 06/27/2016  ? Influenza-Unspecified 07/07/2012  ? PFIZER(Purple Top)SARS-COV-2 Vaccination 11/20/2019, 12/13/2019, 09/20/2020, 11/17/2020, 07/18/2021  ? Pension scheme manager 42yr & up 07/18/2021  ? Pneumococcal Conjugate-13 11/07/2014  ? Pneumococcal Polysaccharide-23 10/08/2007, 08/11/2015, 11/22/2016, 11/21/2017, 11/19/2018, 11/18/2019, 11/17/2020  ? Zoster Recombinat (Shingrix) 07/31/2017, 12/09/2017  ? ?Pertinent  Health Maintenance Due  ?Topic  Date Due  ? INFLUENZA VACCINE  05/07/2022  ? DEXA SCAN  Completed  ? ? ?  10/05/2020  ?  1:09 PM 02/09/2021  ?  1:08 PM 08/28/2021  ? 11:28 AM 11/01/2021  ?  2:29 PM 02/05/2022  ?  1:46 PM  ?Fall Risk  ?Falls in the past year? 0 1 1 0 0  ?Was there an injury with Fall? 0 1 0 0 0  ?Fall Risk Category Calculator 0 2 2 0 0  ?Fall Risk Category Low Moderate Moderate Low Low  ?Patient Fall Risk Level Low fall risk Moderate fall risk Moderate fall risk Low fall risk Low fall risk   ?Patient at Risk for Falls Due to   History of fall(s) No Fall Risks No Fall Risks  ?Fall risk Follow up   Falls evaluation completed Falls evaluation completed Falls evaluation completed  ? ?Functional St

## 2022-02-05 NOTE — Patient Instructions (Addendum)
Home health Physical therapy ordered today agency will call you to schedule appointment  ?- Apply Aquaphor ointment or Cetaphil cream to both legs for skin dryness  ?

## 2022-02-09 DIAGNOSIS — Z8673 Personal history of transient ischemic attack (TIA), and cerebral infarction without residual deficits: Secondary | ICD-10-CM | POA: Diagnosis not present

## 2022-02-09 DIAGNOSIS — M170 Bilateral primary osteoarthritis of knee: Secondary | ICD-10-CM | POA: Diagnosis not present

## 2022-02-09 DIAGNOSIS — H548 Legal blindness, as defined in USA: Secondary | ICD-10-CM | POA: Diagnosis not present

## 2022-02-09 DIAGNOSIS — H409 Unspecified glaucoma: Secondary | ICD-10-CM | POA: Diagnosis not present

## 2022-02-09 DIAGNOSIS — I10 Essential (primary) hypertension: Secondary | ICD-10-CM | POA: Diagnosis not present

## 2022-02-09 DIAGNOSIS — F419 Anxiety disorder, unspecified: Secondary | ICD-10-CM | POA: Diagnosis not present

## 2022-02-09 DIAGNOSIS — G629 Polyneuropathy, unspecified: Secondary | ICD-10-CM | POA: Diagnosis not present

## 2022-02-09 DIAGNOSIS — H3530 Unspecified macular degeneration: Secondary | ICD-10-CM | POA: Diagnosis not present

## 2022-02-09 DIAGNOSIS — Z7951 Long term (current) use of inhaled steroids: Secondary | ICD-10-CM | POA: Diagnosis not present

## 2022-02-09 DIAGNOSIS — Z79899 Other long term (current) drug therapy: Secondary | ICD-10-CM | POA: Diagnosis not present

## 2022-02-09 DIAGNOSIS — I739 Peripheral vascular disease, unspecified: Secondary | ICD-10-CM | POA: Diagnosis not present

## 2022-02-09 DIAGNOSIS — Z7902 Long term (current) use of antithrombotics/antiplatelets: Secondary | ICD-10-CM | POA: Diagnosis not present

## 2022-02-09 DIAGNOSIS — K219 Gastro-esophageal reflux disease without esophagitis: Secondary | ICD-10-CM | POA: Diagnosis not present

## 2022-02-09 DIAGNOSIS — Z9181 History of falling: Secondary | ICD-10-CM | POA: Diagnosis not present

## 2022-02-09 DIAGNOSIS — J45909 Unspecified asthma, uncomplicated: Secondary | ICD-10-CM | POA: Diagnosis not present

## 2022-02-15 ENCOUNTER — Other Ambulatory Visit: Payer: Self-pay | Admitting: Family

## 2022-02-15 ENCOUNTER — Telehealth: Payer: Self-pay | Admitting: *Deleted

## 2022-02-15 DIAGNOSIS — J45909 Unspecified asthma, uncomplicated: Secondary | ICD-10-CM | POA: Diagnosis not present

## 2022-02-15 DIAGNOSIS — M170 Bilateral primary osteoarthritis of knee: Secondary | ICD-10-CM | POA: Diagnosis not present

## 2022-02-15 DIAGNOSIS — I739 Peripheral vascular disease, unspecified: Secondary | ICD-10-CM | POA: Diagnosis not present

## 2022-02-15 DIAGNOSIS — K219 Gastro-esophageal reflux disease without esophagitis: Secondary | ICD-10-CM | POA: Diagnosis not present

## 2022-02-15 DIAGNOSIS — G629 Polyneuropathy, unspecified: Secondary | ICD-10-CM | POA: Diagnosis not present

## 2022-02-15 DIAGNOSIS — I10 Essential (primary) hypertension: Secondary | ICD-10-CM | POA: Diagnosis not present

## 2022-02-15 NOTE — Telephone Encounter (Signed)
Katharine Look, daughter notified and agreed.  ?

## 2022-02-15 NOTE — Telephone Encounter (Signed)
Patient daughter, Katharine Look called and wanted to know if you recommend the new Covid vaccination for the patient and if she is eligible to get it now.   ? ?Please Advise.  ?

## 2022-02-15 NOTE — Telephone Encounter (Signed)
I recommend new COVID-19 booster vaccine, ?

## 2022-02-18 ENCOUNTER — Telehealth: Payer: Self-pay | Admitting: *Deleted

## 2022-02-18 NOTE — Telephone Encounter (Signed)
Patient daughter Katharine Look called and wanted to know how she can get a Part Time person to come into home and help Brother on a Temporary basis while the brother is seeking medical care for his legs.  ?Stated that she wants something different than the [employer] [facility] [place].  ? ?Please Advise.  ?

## 2022-02-18 NOTE — Telephone Encounter (Signed)
PCS Form printed and filled out  and placed in [name] [name]'s folder to review and sign.  ?

## 2022-02-18 NOTE — Telephone Encounter (Signed)
Recommend Personal Care Service for assistance with ADL's.  ?

## 2022-02-20 ENCOUNTER — Ambulatory Visit: Payer: Medicare Other | Admitting: Podiatry

## 2022-02-21 DIAGNOSIS — I10 Essential (primary) hypertension: Secondary | ICD-10-CM | POA: Diagnosis not present

## 2022-02-21 DIAGNOSIS — G629 Polyneuropathy, unspecified: Secondary | ICD-10-CM | POA: Diagnosis not present

## 2022-02-21 DIAGNOSIS — M170 Bilateral primary osteoarthritis of knee: Secondary | ICD-10-CM | POA: Diagnosis not present

## 2022-02-21 DIAGNOSIS — J45909 Unspecified asthma, uncomplicated: Secondary | ICD-10-CM | POA: Diagnosis not present

## 2022-02-21 DIAGNOSIS — K219 Gastro-esophageal reflux disease without esophagitis: Secondary | ICD-10-CM | POA: Diagnosis not present

## 2022-02-21 DIAGNOSIS — I739 Peripheral vascular disease, unspecified: Secondary | ICD-10-CM | POA: Diagnosis not present

## 2022-02-26 DIAGNOSIS — J45909 Unspecified asthma, uncomplicated: Secondary | ICD-10-CM | POA: Diagnosis not present

## 2022-02-26 DIAGNOSIS — G629 Polyneuropathy, unspecified: Secondary | ICD-10-CM | POA: Diagnosis not present

## 2022-02-26 DIAGNOSIS — M170 Bilateral primary osteoarthritis of knee: Secondary | ICD-10-CM | POA: Diagnosis not present

## 2022-02-26 DIAGNOSIS — I739 Peripheral vascular disease, unspecified: Secondary | ICD-10-CM | POA: Diagnosis not present

## 2022-02-26 DIAGNOSIS — K219 Gastro-esophageal reflux disease without esophagitis: Secondary | ICD-10-CM | POA: Diagnosis not present

## 2022-02-26 DIAGNOSIS — I10 Essential (primary) hypertension: Secondary | ICD-10-CM | POA: Diagnosis not present

## 2022-02-28 DIAGNOSIS — G629 Polyneuropathy, unspecified: Secondary | ICD-10-CM | POA: Diagnosis not present

## 2022-02-28 DIAGNOSIS — K219 Gastro-esophageal reflux disease without esophagitis: Secondary | ICD-10-CM | POA: Diagnosis not present

## 2022-02-28 DIAGNOSIS — M170 Bilateral primary osteoarthritis of knee: Secondary | ICD-10-CM | POA: Diagnosis not present

## 2022-02-28 DIAGNOSIS — I10 Essential (primary) hypertension: Secondary | ICD-10-CM | POA: Diagnosis not present

## 2022-02-28 DIAGNOSIS — I739 Peripheral vascular disease, unspecified: Secondary | ICD-10-CM | POA: Diagnosis not present

## 2022-02-28 DIAGNOSIS — J45909 Unspecified asthma, uncomplicated: Secondary | ICD-10-CM | POA: Diagnosis not present

## 2022-03-05 DIAGNOSIS — G629 Polyneuropathy, unspecified: Secondary | ICD-10-CM | POA: Diagnosis not present

## 2022-03-05 DIAGNOSIS — I10 Essential (primary) hypertension: Secondary | ICD-10-CM | POA: Diagnosis not present

## 2022-03-05 DIAGNOSIS — I739 Peripheral vascular disease, unspecified: Secondary | ICD-10-CM | POA: Diagnosis not present

## 2022-03-05 DIAGNOSIS — J45909 Unspecified asthma, uncomplicated: Secondary | ICD-10-CM | POA: Diagnosis not present

## 2022-03-05 DIAGNOSIS — M170 Bilateral primary osteoarthritis of knee: Secondary | ICD-10-CM | POA: Diagnosis not present

## 2022-03-05 DIAGNOSIS — K219 Gastro-esophageal reflux disease without esophagitis: Secondary | ICD-10-CM | POA: Diagnosis not present

## 2022-03-06 ENCOUNTER — Ambulatory Visit (INDEPENDENT_AMBULATORY_CARE_PROVIDER_SITE_OTHER): Payer: Medicare Other | Admitting: Podiatry

## 2022-03-06 ENCOUNTER — Encounter: Payer: Self-pay | Admitting: Podiatry

## 2022-03-06 DIAGNOSIS — B351 Tinea unguium: Secondary | ICD-10-CM

## 2022-03-06 DIAGNOSIS — S90819A Abrasion, unspecified foot, initial encounter: Secondary | ICD-10-CM

## 2022-03-06 DIAGNOSIS — M79675 Pain in left toe(s): Secondary | ICD-10-CM | POA: Diagnosis not present

## 2022-03-06 DIAGNOSIS — M79674 Pain in right toe(s): Secondary | ICD-10-CM

## 2022-03-06 NOTE — Patient Instructions (Signed)
Apply toe protector to left 5th toe every morning. Remove every evening.  Continue antibiotic ointment to top of left foot once daily until healed.

## 2022-03-10 NOTE — Progress Notes (Signed)
  Subjective:  [name], female    DOB: 16-Sep-1924,  MRN: [id number]  [name] [name] presents to clinic today for painful thick toenails that are difficult to trim. Pain interferes with ambulation. Aggravating factors include wearing enclosed shoe gear. Pain is relieved with periodic professional debridement.  She is sight impaired. Accompanied by her son who waits in the lobby.  New problem(s): She complains of left 5th toe discomfort today. Denies any trauma to toe, but she has a healing abrasion on the dorsal midfoot area which is healing.  PCP is Ngetich, Nelda Bucks, NP , and last visit was November 01, 2021.  Allergies  Allergen Reactions   Aspirin Other (See Comments)    sweats   Codeine Other (See Comments)   Iodine Other (See Comments)    Pt is not aware of this allergy, does not recall much info.   Prednisone Other (See Comments)    sweats   Lyrica [Pregabalin]     Wobbly, "more than sleepy"   Benzonatate Itching and Rash   Sulfa Antibiotics Other (See Comments)    Doesn't remember Other reaction(s): Unknown    Review of Systems: Negative except as noted in the HPI.  Objective:  General: Patient is a pleasant 88 y.o. African American female WD, WN in NAD. AAO x 3.   Neurovascular Examination: CFT <3 seconds b/l LE. Palpable DP pulse(s) b/l LE. Palpable PT pulse(s) b/l LE. Pedal hair absent. No pain with calf compression b/l. Lower extremity skin temperature gradient within normal limits. Dependent edema noted b/l LE. No cyanosis or clubbing noted b/l LE.  Pt has subjective symptoms of neuropathy. Protective sensation intact 5/5 intact bilaterally with 10g monofilament b/l.  Dermatological:  Pedal integument with normal turgor, texture and tone BLE. No open wounds b/l LE. Left 5th digit with no swelling. No interdigital macerations noted b/l LE. Toenails 1-5 b/l elongated, discolored, dystrophic, thickened, crumbly with subungual debris and tenderness to  dorsal palpation. Healing abrasion(s) with intact scab noted dorsal forefoot LLE; examination of left 5th toe normal. No erythema, no edema, no drainage, no fluctuance.   Musculoskeletal:  Muscle strength 5/5 to all lower extremity muscle groups bilaterally. No pain, crepitus or joint limitation noted with ROM bilateral LE. Hammertoe(s) noted to the L 4th toe. Utilizes wheelchair for mobility assistance.  Assessment/Plan: 1. Pain due to onychomycosis of toenails of both feet   2. Abrasion of dorsum of foot   -Patient was evaluated and treated. All patient's and/or POA's questions/concerns answered on today's visit. -Verbalized treatment plan to Ms. Laday and printed AVS for her son. Dispensed toe protector for left 5th digit. Apply every morning. Remove every evening. Continue antibiotic ointment to left foot once daily until resolved. -Patient to continue soft, supportive shoe gear daily. -Toenails 1-5 b/l were debrided in length and girth with sterile nail nippers and dremel without iatrogenic bleeding.  -Patient/POA to call should there be question/concern in the interim.   Return in about 3 months (around 06/06/2022).  Marzetta Board, DPM

## 2022-03-11 ENCOUNTER — Emergency Department (HOSPITAL_COMMUNITY): Payer: Medicare Other

## 2022-03-11 ENCOUNTER — Emergency Department (HOSPITAL_COMMUNITY)
Admission: EM | Admit: 2022-03-11 | Discharge: 2022-03-12 | Disposition: A | Discharge status: Home / Self Care | Payer: Medicare Other | Attending: Emergency Medicine | Admitting: Emergency Medicine

## 2022-03-11 ENCOUNTER — Encounter (HOSPITAL_COMMUNITY): Payer: Self-pay

## 2022-03-11 ENCOUNTER — Other Ambulatory Visit: Payer: Self-pay

## 2022-03-11 ENCOUNTER — Telehealth: Payer: Self-pay | Admitting: Orthopedic Surgery

## 2022-03-11 DIAGNOSIS — R5381 Other malaise: Secondary | ICD-10-CM

## 2022-03-11 DIAGNOSIS — Z7951 Long term (current) use of inhaled steroids: Secondary | ICD-10-CM | POA: Diagnosis not present

## 2022-03-11 DIAGNOSIS — R531 Weakness: Secondary | ICD-10-CM | POA: Diagnosis not present

## 2022-03-11 DIAGNOSIS — F419 Anxiety disorder, unspecified: Secondary | ICD-10-CM | POA: Diagnosis not present

## 2022-03-11 DIAGNOSIS — I739 Peripheral vascular disease, unspecified: Secondary | ICD-10-CM | POA: Diagnosis not present

## 2022-03-11 DIAGNOSIS — J45909 Unspecified asthma, uncomplicated: Secondary | ICD-10-CM | POA: Diagnosis not present

## 2022-03-11 DIAGNOSIS — I10 Essential (primary) hypertension: Secondary | ICD-10-CM | POA: Diagnosis not present

## 2022-03-11 DIAGNOSIS — Z9181 History of falling: Secondary | ICD-10-CM | POA: Diagnosis not present

## 2022-03-11 DIAGNOSIS — R4182 Altered mental status, unspecified: Secondary | ICD-10-CM | POA: Diagnosis not present

## 2022-03-11 DIAGNOSIS — K219 Gastro-esophageal reflux disease without esophagitis: Secondary | ICD-10-CM | POA: Diagnosis not present

## 2022-03-11 DIAGNOSIS — M170 Bilateral primary osteoarthritis of knee: Secondary | ICD-10-CM | POA: Diagnosis not present

## 2022-03-11 DIAGNOSIS — H409 Unspecified glaucoma: Secondary | ICD-10-CM | POA: Diagnosis not present

## 2022-03-11 DIAGNOSIS — Z8673 Personal history of transient ischemic attack (TIA), and cerebral infarction without residual deficits: Secondary | ICD-10-CM | POA: Diagnosis not present

## 2022-03-11 DIAGNOSIS — Z79899 Other long term (current) drug therapy: Secondary | ICD-10-CM | POA: Diagnosis not present

## 2022-03-11 DIAGNOSIS — H3530 Unspecified macular degeneration: Secondary | ICD-10-CM | POA: Diagnosis not present

## 2022-03-11 DIAGNOSIS — G629 Polyneuropathy, unspecified: Secondary | ICD-10-CM | POA: Diagnosis not present

## 2022-03-11 DIAGNOSIS — D649 Anemia, unspecified: Secondary | ICD-10-CM | POA: Diagnosis not present

## 2022-03-11 DIAGNOSIS — H548 Legal blindness, as defined in USA: Secondary | ICD-10-CM | POA: Diagnosis not present

## 2022-03-11 DIAGNOSIS — R5383 Other fatigue: Secondary | ICD-10-CM | POA: Diagnosis not present

## 2022-03-11 DIAGNOSIS — Z7902 Long term (current) use of antithrombotics/antiplatelets: Secondary | ICD-10-CM | POA: Diagnosis not present

## 2022-03-11 LAB — CBC WITH DIFFERENTIAL/PLATELET
Abs Immature Granulocytes: 0.01 10*3/uL (ref 0.00–0.07)
Basophils Absolute: 0 10*3/uL (ref 0.0–0.1)
Basophils Relative: 1 %
Eosinophils Absolute: 0.1 10*3/uL (ref 0.0–0.5)
Eosinophils Relative: 2 %
HCT: 24.6 % — ABNORMAL LOW (ref 36.0–46.0)
Hemoglobin: 8.2 g/dL — ABNORMAL LOW (ref 12.0–15.0)
Immature Granulocytes: 0 %
Lymphocytes Relative: 39 %
Lymphs Abs: 1.6 10*3/uL (ref 0.7–4.0)
MCH: 32.3 pg (ref 26.0–34.0)
MCHC: 33.3 g/dL (ref 30.0–36.0)
MCV: 96.9 fL (ref 80.0–100.0)
Monocytes Absolute: 0.4 10*3/uL (ref 0.1–1.0)
Monocytes Relative: 10 %
Neutro Abs: 2.1 10*3/uL (ref 1.7–7.7)
Neutrophils Relative %: 48 %
Platelets: 148 10*3/uL — ABNORMAL LOW (ref 150–400)
RBC: 2.54 MIL/uL — ABNORMAL LOW (ref 3.87–5.11)
RDW: 15.2 % (ref 11.5–15.5)
WBC: 4.2 10*3/uL (ref 4.0–10.5)
nRBC: 0 % (ref 0.0–0.2)

## 2022-03-11 LAB — CBG MONITORING, ED: Glucose-Capillary: 115 mg/dL — ABNORMAL HIGH (ref 70–99)

## 2022-03-11 MED ORDER — SODIUM CHLORIDE 0.9 % IV BOLUS
500.0000 mL | Freq: Once | INTRAVENOUS | Status: AC
Start: 2022-03-11 — End: 2022-03-11
  Administered 2022-03-11: 500 mL via INTRAVENOUS

## 2022-03-11 NOTE — Telephone Encounter (Signed)
Son called on call provider to report symptoms of progressive weakness and fatigue. She is unable to use her walker. Son is having to assist her with wheelchair. He also reports she is not talking much. Advised him to call 911 or report to nearest ED for evaluation.

## 2022-03-11 NOTE — ED Notes (Signed)
Pt unable to stand for long during orthostatic vitals.

## 2022-03-11 NOTE — ED Provider Notes (Signed)
[facility] DEPT Provider Note   CSN: [id number] Arrival date & time: 03/11/22  2108     History {Add pertinent medical, surgical, social history, OB history to HPI:1} Chief Complaint  Patient presents with   Weakness    [name] [name] is a 88 y.o. female.  HPI Patient presents by way of emergency medical services for evaluation of weakness.  She comes from home where she lives with her son.  I talked to the phone to get the history.  Patient cannot give any history.  Her son [name] states that the patient has been weak and sleepy today.  She has had a decreased appetite as well.  Her memory has been worsening over the last 3 months.  She saw her PCP about a month ago for evaluation of similar problems.    Home Medications Prior to Admission medications   Medication Sig Start Date End Date Taking? Authorizing Provider  acetaminophen (TYLENOL) 500 MG tablet Take 500 mg by mouth every 6 (six) hours as needed for mild pain or moderate pain.     [provider]  albuterol (PROVENTIL HFA;VENTOLIN HFA) 108 (90 Base) MCG/ACT inhaler Inhale 2 puffs into the lungs every 6 (six) hours as needed for wheezing. 11/27/17   Mariea Clonts, Tiffany L, DO  alum hydroxide-mag trisilicate (GAVISCON) 72-09 MG CHEW chewable tablet Chew 2 tablets by mouth 2 (two) times daily.    [provider]  amLODipine (NORVASC) 5 MG tablet Take 1 tablet (5 mg total) by mouth daily. Please keep your 09/03/21 appt with Dr. Radford Pax. 01/10/22   Sueanne Margarita, MD  atorvastatin (LIPITOR) 10 MG tablet TAKE 1 TABLET BY MOUTH EVERY DAY 09/13/21   Ngetich, Dinah C, NP  atropine 1 % ophthalmic solution Place 1 drop into the right eye daily.  06/30/19   [provider]  Brinzolamide-Brimonidine 1-0.2 % SUSP Apply 1 drop to eye 3 (three) times daily.    [provider]  budesonide-formoterol (SYMBICORT) 80-4.5 MCG/ACT inhaler Inhale 2 puffs into the lungs 2 (two) times daily.     [provider]  clopidogrel (PLAVIX) 75 MG tablet TAKE 1 TABLET BY MOUTH EVERY DAY 02/15/22   Ngetich, Dinah C, NP  Coenzyme Q10 (COQ10) 50 MG CAPS Take 50 mg by mouth daily.    [provider]  docusate sodium (COLACE) 100 MG capsule TAKE 1 CAPSULE BY MOUTH TWICE A DAY 10/05/20   Reed, Tiffany L, DO  Emollient (CETAPHIL) cream Apply topically 2 (two) times daily. To legs 02/05/22   Ngetich, Dinah C, NP  fluticasone (FLONASE) 50 MCG/ACT nasal spray Place 1 spray into the nose daily.    [provider]  hydrocortisone (ANUSOL-HC) 2.5 % rectal cream Place 1 application rectally daily as needed for hemorrhoids or anal itching. 02/03/20   Reed, Tiffany L, DO  loratadine (CLARITIN) 10 MG tablet Take 10 mg by mouth daily. 08/16/21   [provider]  Multiple Vitamins-Minerals (PRESERVISION AREDS 2) CAPS Take 1 capsule by mouth 2 (two) times daily.    [provider]  Multiple Vitamins-Minerals (SENIOR MULTIVITAMIN PLUS) TABS Take 1 tablet by mouth daily.     [provider]  NONFORMULARY OR COMPOUNDED ITEM 1 application 2 (two) times daily as needed. [facility]:  Peripheral Neuropathy Cream - Bupivacaine 1%, Doxepin 3%, Gabapentin 6%, Pentoxifylline 3%, Topiramate 1%, apply 1-2 grams to affected areas 3-4 times daily prn.    [provider]  Omega-3 Fatty Acids (FISH OIL)  1200 MG CPDR Take 1,200 mg by mouth daily. Take one tablet by mouth once daily    [provider]  prednisoLONE acetate (PRED FORTE) 1 % ophthalmic suspension Place 1 drop into the right eye daily. 06/13/20   [provider]  risperiDONE (RISPERDAL) 0.5 MG tablet Take 1 tablet (0.5 mg total) by mouth 2 (two) times daily. 10/31/21   Ngetich, Dinah C, NP  ROCKLATAN 0.02-0.005 % SOLN Place 1 drop into the left eye daily. 04/23/21   [provider]  sodium chloride (MURO 128) 5 % ophthalmic solution Place 1 drop into the left eye 4 times daily.     [provider]  vitamin C (ASCORBIC ACID) 500 MG tablet Take 500 mg by mouth daily.    [provider]      Allergies    Aspirin, Codeine, Iodine, Prednisone, Lyrica [pregabalin], Benzonatate, and Sulfa antibiotics    Review of Systems   Review of Systems  Physical Exam Updated Vital Signs BP (!) 163/83   Pulse 65   Temp 98.7 F (37.1 C) (Oral)   Resp 18   Ht '5\' 4"'$  (1.626 m)   Wt 59 kg   SpO2 94%   BMI 22.33 kg/m  Physical Exam Vitals and nursing note reviewed.  Constitutional:      General: She is not in acute distress.    Appearance: She is well-developed. She is not ill-appearing, toxic-appearing or diaphoretic.  HENT:     Head: Normocephalic and atraumatic.     Right Ear: External ear normal.     Left Ear: External ear normal.     Nose: Nose normal.     Mouth/Throat:     Mouth: Mucous membranes are moist.  Eyes:     Pupils: Pupils are equal, round, and reactive to light.     Comments: Bilateral conjunctivitis with mild discharge.  Nonspecific appearance.  This may indicate a chronic abnormality.  Neck:     Trachea: Phonation normal.  Cardiovascular:     Rate and Rhythm: Normal rate and regular rhythm.     Heart sounds: Normal heart sounds. No murmur heard. Pulmonary:     Effort: Pulmonary effort is normal. No respiratory distress.     Breath sounds: Normal breath sounds. No stridor.  Abdominal:     General: There is no distension.     Palpations: Abdomen is soft.     Tenderness: There is no abdominal tenderness.  Musculoskeletal:        General: Normal range of motion.     Cervical back: Normal range of motion and neck supple.     Comments: Normal strength, arms and legs bilaterally.  Skin:    General: Skin is warm and dry.  Neurological:     Mental Status: She is alert.     Cranial Nerves: No cranial nerve deficit.     Sensory: No sensory deficit.     Motor: No abnormal muscle tone.     Coordination: Coordination normal.      Comments: Alert and conversant.  No aphasia.  No nystagmus.  No dysarthria.  Follows commands accurately.  Psychiatric:        Mood and Affect: Mood normal.        Behavior: Behavior normal.    ED Results / Procedures / Treatments   Labs (all labs ordered are listed, but only abnormal results are displayed) Labs Reviewed  CBC WITH DIFFERENTIAL/PLATELET - Abnormal; Notable for the following components:  Result Value   RBC 2.54 (*)    Hemoglobin 8.2 (*)    HCT 24.6 (*)    Platelets 148 (*)    All other components within normal limits  CBG MONITORING, ED - Abnormal; Notable for the following components:   Glucose-Capillary 115 (*)    All other components within normal limits  URINALYSIS, ROUTINE W REFLEX MICROSCOPIC  COMPREHENSIVE METABOLIC PANEL  POC OCCULT BLOOD, ED    EKG EKG Interpretation  Date/Time:  Monday March 11 2022 21:56:33 EDT Ventricular Rate:  63 PR Interval:  238 QRS Duration: 143 QT Interval:  438 QTC Calculation: 449 R Axis:   77 Text Interpretation: Sinus rhythm Atrial premature complexes Prolonged PR interval Right bundle branch block since last tracing no significant change Confirmed by Daleen Bo [phone]) on 03/11/2022 11:33:34 PM  Radiology DG Chest 2 View  Result Date: 03/11/2022 CLINICAL DATA:  Fatigue and weakness. EXAM: CHEST - 2 VIEW COMPARISON:  November 22, 2016 FINDINGS: The heart size and mediastinal contours are within normal limits. Both lungs are clear. There is moderate severity dextroscoliosis of the mid and lower thoracic spine without an acute osseous abnormality. IMPRESSION: No active cardiopulmonary disease. Electronically Signed   By: Virgina Norfolk M.D.   On: 03/11/2022 22:44   CT Head Wo Contrast  Result Date: 03/11/2022 CLINICAL DATA:  Mental status change, unknown cause. EXAM: CT HEAD WITHOUT CONTRAST TECHNIQUE: Contiguous axial images were obtained from the base of the skull through the vertex without intravenous contrast.  RADIATION DOSE REDUCTION: This exam was performed according to the departmental dose-optimization program which includes automated exposure control, adjustment of the mA and/or kV according to patient size and/or use of iterative reconstruction technique. COMPARISON:  03/09/2018. FINDINGS: Brain: No acute intracranial hemorrhage, midline shift or mass effect. No extra-axial fluid collection. Mild atrophy is noted. Subcortical and periventricular white matter hypodensities are noted bilaterally. No hydrocephalus. Vascular: No hyperdense vessel or unexpected calcification. Skull: Normal. Negative for fracture or focal lesion. Sinuses/Orbits: No acute finding. Other: None. IMPRESSION: 1. No acute intracranial process. 2. Atrophy with chronic microvascular ischemic changes. Electronically Signed   By: Brett Fairy M.D.   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  This has been going on for several weeks.  Her son helps care for her.  He gives her eyedrops, frequently for unspecified eye disorder which has caused her to be blind.  Amount and/or Complexity of Data Reviewed Independent Historian: caregiver    Details: Patient's son at home by telephone.  He called EMS.  He was concerned that the patient has been weak and sleepy today.  She has not been eating well the last few days.  Her memory is worsening for last 3 months.  She has in-home physical therapy but he feels it is making things worse. External Data Reviewed: labs and notes.    Details: Visit with PCP about a month ago for evaluation of  weakness and difficulty walking.  Following visit, physical therapy was ordered. Labs:  ordered.    Details: CBC, metabolic panel, urinalysis-initial findings normal except hemoglobin low Radiology: ordered and independent interpretation performed.    Details: Chest x-ray, CT head-no acute pneumonia or intracranial abnormality. ECG/medicine tests: ordered and independent interpretation performed.    Details: Cardiac monitor-normal sinus rhythm  Risk Decision regarding hospitalization. Risk Details: Patient presenting with nonspecific and chronic/ongoing complaints.  She required evaluation for acute infection, CVA, pneumonia and metabolic disorders.  Anticipate that if these tests are all negative that she can be discharged with outpatient follow-up, using home health services.  Plan to increase management with nursing, social work and occupational therapy.  Patient may ultimately require placement.     {Document critical care time when appropriate:1} {Document review of labs and clinical decision tools ie heart score, Chads2Vasc2 etc:1}  {Document your independent review of radiology images, and any outside records:1} {Document your discussion with family members, caretakers, and with consultants:1} {Document social determinants of health affecting pt's care:1} {Document your decision making why or why not admission, treatments were needed:1} Final Clinical Impression(s) / ED Diagnoses Final diagnoses:  Malaise    Rx / DC Orders ED Discharge Orders     None

## 2022-03-11 NOTE — ED Triage Notes (Signed)
EMS reports they were called out because pt has had fatigue and weakness today since 16:00. EMS reports that pt normally walks around the house with her walker but has just been lying around all afternoon.

## 2022-03-12 DIAGNOSIS — R279 Unspecified lack of coordination: Secondary | ICD-10-CM | POA: Diagnosis not present

## 2022-03-12 DIAGNOSIS — Z743 Need for continuous supervision: Secondary | ICD-10-CM | POA: Diagnosis not present

## 2022-03-12 DIAGNOSIS — R5383 Other fatigue: Secondary | ICD-10-CM | POA: Diagnosis not present

## 2022-03-12 DIAGNOSIS — R531 Weakness: Secondary | ICD-10-CM | POA: Diagnosis not present

## 2022-03-12 DIAGNOSIS — I10 Essential (primary) hypertension: Secondary | ICD-10-CM | POA: Diagnosis not present

## 2022-03-12 LAB — URINALYSIS, ROUTINE W REFLEX MICROSCOPIC
Bacteria, UA: NONE SEEN
Bilirubin Urine: NEGATIVE
Glucose, UA: NEGATIVE mg/dL
Hgb urine dipstick: NEGATIVE
Ketones, ur: NEGATIVE mg/dL
Nitrite: NEGATIVE
Protein, ur: NEGATIVE mg/dL
Specific Gravity, Urine: 1.003 — ABNORMAL LOW (ref 1.005–1.030)
pH: 6 (ref 5.0–8.0)

## 2022-03-12 LAB — COMPREHENSIVE METABOLIC PANEL
ALT: 19 U/L (ref 0–44)
AST: 23 U/L (ref 15–41)
Albumin: 3.5 g/dL (ref 3.5–5.0)
Alkaline Phosphatase: 73 U/L (ref 38–126)
Anion gap: 5 (ref 5–15)
BUN: 15 mg/dL (ref 8–23)
CO2: 26 mmol/L (ref 22–32)
Calcium: 9.2 mg/dL (ref 8.9–10.3)
Chloride: 113 mmol/L — ABNORMAL HIGH (ref 98–111)
Creatinine, Ser: 0.77 mg/dL (ref 0.44–1.00)
GFR, Estimated: >60 mL/min (ref >60)
Glucose, Bld: 152 mg/dL — ABNORMAL HIGH (ref 70–99)
Potassium: 3.9 mmol/L (ref 3.5–5.1)
Sodium: 144 mmol/L (ref 135–145)
Total Bilirubin: 1 mg/dL (ref 0.3–1.2)
Total Protein: 6.5 g/dL (ref 6.5–8.1)

## 2022-03-12 LAB — POC OCCULT BLOOD, ED: Fecal Occult Bld: NEGATIVE

## 2022-03-12 MED ORDER — ACETAMINOPHEN 500 MG PO TABS
500.0000 mg | ORAL_TABLET | Freq: Four times a day (QID) | ORAL | Status: DC | PRN
Start: 2022-03-12 — End: 2022-03-12

## 2022-03-12 MED ORDER — CLOPIDOGREL BISULFATE 75 MG PO TABS: 75.0000 mg | ORAL_TABLET | Freq: Every day | ORAL | Status: DC

## 2022-03-12 MED ORDER — DOCUSATE SODIUM 100 MG PO CAPS
100.0000 mg | ORAL_CAPSULE | Freq: Two times a day (BID) | ORAL | Status: DC
  Administered 2022-03-12: 100 mg via ORAL
  Filled 2022-03-12: qty 1

## 2022-03-12 MED ORDER — LORATADINE 10 MG PO TABS
10.0000 mg | ORAL_TABLET | Freq: Every day | ORAL | Status: DC
  Administered 2022-03-12: 10 mg via ORAL
  Filled 2022-03-12: qty 1

## 2022-03-12 MED ORDER — ALBUTEROL SULFATE HFA 108 (90 BASE) MCG/ACT IN AERS: 2.0000 | INHALATION_SPRAY | Freq: Four times a day (QID) | RESPIRATORY_TRACT | Status: DC | PRN

## 2022-03-12 MED ORDER — SODIUM CHLORIDE (HYPERTONIC) 5 % OP SOLN: 1.0000 [drp] | OPHTHALMIC | Status: DC | PRN

## 2022-03-12 MED ORDER — PREDNISOLONE ACETATE 1 % OP SUSP
1.0000 [drp] | Freq: Every day | OPHTHALMIC | Status: DC
Start: 2022-03-12 — End: 2022-03-12

## 2022-03-12 MED ORDER — MOMETASONE FURO-FORMOTEROL FUM 100-5 MCG/ACT IN AERO: 2.0000 | INHALATION_SPRAY | Freq: Two times a day (BID) | RESPIRATORY_TRACT | Status: DC

## 2022-03-12 MED ORDER — ALBUTEROL SULFATE (2.5 MG/3ML) 0.083% IN NEBU: 2.5000 mg | INHALATION_SOLUTION | Freq: Four times a day (QID) | RESPIRATORY_TRACT | Status: DC | PRN

## 2022-03-12 MED ORDER — RISPERIDONE 0.5 MG PO TABS
0.5000 mg | ORAL_TABLET | Freq: Two times a day (BID) | ORAL | Status: DC
  Administered 2022-03-12: 0.5 mg via ORAL
  Filled 2022-03-12: qty 1

## 2022-03-12 MED ORDER — ATORVASTATIN CALCIUM 10 MG PO TABS
10.0000 mg | ORAL_TABLET | Freq: Every day | ORAL | Status: DC
  Administered 2022-03-12: 10 mg via ORAL
  Filled 2022-03-12: qty 1

## 2022-03-12 MED ORDER — ATROPINE SULFATE 1 % OP SOLN: 1.0000 [drp] | Freq: Every day | OPHTHALMIC | Status: DC

## 2022-03-12 MED ORDER — ALUM HYDROXIDE-MAG TRISILICATE 80-20 MG PO CHEW: 2.0000 | CHEWABLE_TABLET | Freq: Two times a day (BID) | ORAL | Status: DC

## 2022-03-12 MED ORDER — AMLODIPINE BESYLATE 5 MG PO TABS
5.0000 mg | ORAL_TABLET | Freq: Every day | ORAL | Status: DC
  Administered 2022-03-12: 5 mg via ORAL
  Filled 2022-03-12: qty 1

## 2022-03-12 NOTE — ED Provider Notes (Signed)
  Provider Note MRN:  [id number]  Arrival date & time: 03/12/22    ED Course and Medical Decision Making  Assumed care from Dr. Eulis Foster at shift change.  Fatigue, weakness, awaiting laboratory assessment.  Admission versus TOC evaluation in the morning.  Medical work-up is reassuring, no indication for further testing or admission.  Awaiting TOC.  Signed out to default provider.  Procedures  Final Clinical Impressions(s) / ED Diagnoses     ICD-10-CM   1. Malaise  R53.81     2. Anemia, unspecified type  D64.9       ED Discharge Orders     None       Discharge Instructions   None     Barth Kirks. [place] Small, [place] [email or web address]    Maudie Flakes, MD 03/12/22 [phone]

## 2022-03-12 NOTE — Progress Notes (Signed)
.  Transition of Care [facility]) - Emergency Department Mini Assessment   Patient Details  Name: [name] [name] MRN: [id number] Date of Birth: 04/14/1924  Transition of Care [facility]) CM/SW Contact:    Illene Regulus, LCSW Phone Number: 03/12/2022, 10:17 AM   Clinical Narrative: Pt lives with her adult son, CSW spoke with pt's son Brinkley Peet, and he reported he is not interested in placing his mother. He reported he was concerned with her increased weakness, which he thinks is from her [facility]. CSW suggested pt's son speak with pt's [place] physical therapist and his concerns. Pt's son stated pt has [place] [facility] OT with [facility]. He reported an aide will start helping out in the home 3 times a week for 3 hrs a day. Pt's son is requested EMS transport home. PTAR has been called MD and RN made aware.   ED Mini Assessment: What brought you to the Emergency Department? : weekness  Barriers to Discharge: No Barriers Identified     Means of departure: Ambulance  Interventions which prevented an admission or readmission: Transportation Screening, [place] or Services    Patient Contact and Communications     Spoke with: Jewel Baize Contact Date: 03/12/22,   Contact time: 0950 Contact Phone Number: [phone]    Patient states their goals for this hospitalization and ongoing recovery are:: Return home CMS Medicare.gov Compare Post Acute Care list provided to:: Patient    Admission diagnosis:  Weakness Patient Active Problem List   Diagnosis Date Noted   Allergic rhinitis 05/08/2021   Mild persistent asthma, uncomplicated 47/65/4650   Vasomotor rhinitis 05/08/2021   Cornea edema 09/12/2020   Pseudophakia of both eyes 09/12/2020   Leg cramps 05/27/2019   Bilateral primary osteoarthritis of knee 05/27/2019   Macular degeneration of left eye 02/12/2018   Episode of visual loss of left eye 09/03/2017   Primary open angle glaucoma (POAG) of right eye, severe stage  09/03/2017   Bradycardia, drug induced 06/11/2016   Hyperlipidemia 02/06/2016   Headache 12/14/2015   Cerebral infarction due to embolism of cerebral artery (HCC)    Acute CVA (cerebrovascular accident) ([place]) 12/09/2015   Dizziness 12/09/2015   CVA (cerebral infarction) 12/09/2015   Asthma, mild intermittent 04/04/2014   Gastroesophageal reflux disease without esophagitis 04/04/2014   Neuropathy of both feet 04/04/2014   Diuretic-induced hypokalemia 01/03/2014   Olfactory hallucinations 11/23/2013   SOB (shortness of breath) 10/26/2013   Edema of extremities 10/26/2013   Essential hypertension    Osteoarthritis    Seasonal allergies    Glaucoma    PVD (peripheral vascular disease) ([place])    Anxiety    PCP:  Sandrea Hughs, NP Pharmacy:   [id number] [id number]: [phone]: [phone]

## 2022-03-14 DIAGNOSIS — I10 Essential (primary) hypertension: Secondary | ICD-10-CM | POA: Diagnosis not present

## 2022-03-14 DIAGNOSIS — K219 Gastro-esophageal reflux disease without esophagitis: Secondary | ICD-10-CM | POA: Diagnosis not present

## 2022-03-14 DIAGNOSIS — I739 Peripheral vascular disease, unspecified: Secondary | ICD-10-CM | POA: Diagnosis not present

## 2022-03-14 DIAGNOSIS — J45909 Unspecified asthma, uncomplicated: Secondary | ICD-10-CM | POA: Diagnosis not present

## 2022-03-14 DIAGNOSIS — G629 Polyneuropathy, unspecified: Secondary | ICD-10-CM | POA: Diagnosis not present

## 2022-03-14 DIAGNOSIS — M170 Bilateral primary osteoarthritis of knee: Secondary | ICD-10-CM | POA: Diagnosis not present

## 2022-03-18 ENCOUNTER — Encounter: Payer: Self-pay | Admitting: Family

## 2022-03-18 ENCOUNTER — Ambulatory Visit: Payer: Medicare Other | Admitting: Family

## 2022-03-18 ENCOUNTER — Ambulatory Visit (INDEPENDENT_AMBULATORY_CARE_PROVIDER_SITE_OTHER): Payer: Medicare Other | Admitting: Family

## 2022-03-18 VITALS — BP 140/80 | HR 62 | Temp 97.4°F | Resp 18 | Ht 64.0 in | Wt 140.0 lb

## 2022-03-18 DIAGNOSIS — R5383 Other fatigue: Secondary | ICD-10-CM | POA: Diagnosis not present

## 2022-03-18 DIAGNOSIS — D509 Iron deficiency anemia, unspecified: Secondary | ICD-10-CM

## 2022-03-18 NOTE — Progress Notes (Signed)
Provider: Marlowe Sax FNP-C  Mela Perham, Nelda Bucks, NP  Patient Care Team: Jerod Mcquain, Nelda Bucks, NP as PCP - General (Family Medicine) Sueanne Margarita, MD as PCP - Cardiology (Cardiology) Trula Slade, DPM as Consulting Physician (Podiatry) Bond, Tracie Harrier, MD as Referring Physician (Ophthalmology)  Extended Emergency Contact Information Primary Emergency Contact: Leu,Carl          [place] [address] [place] Phone: [phone] Mobile Phone: [phone] Relation: Son Secondary Emergency Contact: Stormy Fabian [place] Phone: [phone] Mobile Phone: [phone] Relation: Daughter  Code Status:  DNR Goals of care: Advanced Directive information    11/01/2021    2:29 PM  Advanced Directives  Does Patient Have a Medical Advance Directive? No  Would patient like information on creating a medical advance directive? No - Patient declined     Chief Complaint  Patient presents with   Hospitalization Follow-up    Patient is here for hospital F/U    HPI:  Pt is a 88 y.o. female seen today for an acute visit for hospital follow up for malaise.she was seen in ED 03/11/2022 after presenting with weakness.Son also reported patient was sleepy and had decreased appetite.Had a CXR and CT of the head which were negative for any abnormalities.ECG indicated NSR.CBC done hgb was low 8.2 previous was 12.2 .FOBT was negative.Her U/A was negative for UTI.she was discharged home to follow up with PCP.  She is here with her son today.states feeling much better.Appetite is good. Denies any blood in the stool.    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   Edema extremities    GERD (gastroesophageal reflux disease)    Glaucoma    Macular degeneration    Per patient    Osteoarthritis    PVD (peripheral vascular disease) ([place])    Seasonal allergies     Stroke [facility])    Past Surgical History:  Procedure Laterality Date   ABDOMINAL HYSTERECTOMY  1981   TONSILLECTOMY  1953    Allergies  Allergen Reactions   Aspirin Other (See Comments)    sweats   Codeine Other (See Comments)   Iodine Other (See Comments)    Pt is not aware of this allergy, does not recall much info.   Prednisone Other (See Comments)    sweats   Lyrica [Pregabalin]     [place], "more than sleepy"   Benzonatate Itching and Rash   Sulfa Antibiotics Other (See Comments)    Doesn't remember Other reaction(s): Unknown    Outpatient Encounter Medications as of 03/18/2022  Medication Sig   acetaminophen (TYLENOL) 500 MG tablet Take 500 mg by mouth every 6 (six) hours as needed for mild pain or moderate pain.    albuterol (PROVENTIL HFA;VENTOLIN HFA) 108 (90 Base) MCG/ACT inhaler Inhale 2 puffs into the lungs every 6 (six) hours as needed for wheezing.   alum hydroxide-mag trisilicate (GAVISCON) 90-38 MG CHEW chewable tablet Chew 2 tablets by mouth 2 (two) times daily.   amLODipine (NORVASC) 5 MG tablet Take 1 tablet (5 mg total) by mouth daily. Please keep your 09/03/21 appt with Dr. Radford Pax.   atorvastatin (LIPITOR) 10 MG tablet TAKE 1 TABLET BY MOUTH EVERY DAY   atropine 1 % ophthalmic solution Place 1 drop into the right eye daily.    Brinzolamide-Brimonidine 1-0.2 % SUSP Apply 1 drop to eye 3 (  three) times daily.   budesonide-formoterol (SYMBICORT) 80-4.5 MCG/ACT inhaler Inhale 2 puffs into the lungs 2 (two) times daily.   clopidogrel (PLAVIX) 75 MG tablet TAKE 1 TABLET BY MOUTH EVERY DAY   Coenzyme Q10 (COQ10) 50 MG CAPS Take 50 mg by mouth daily.   docusate sodium (COLACE) 100 MG capsule TAKE 1 CAPSULE BY MOUTH TWICE A DAY   Emollient (CETAPHIL) cream Apply topically 2 (two) times daily. To legs   fluticasone (FLONASE) 50 MCG/ACT nasal spray Place 1 spray into the nose daily.   hydrocortisone (ANUSOL-HC) 2.5 % rectal cream Place 1 application rectally daily as  needed for hemorrhoids or anal itching.   loratadine (CLARITIN) 10 MG tablet Take 10 mg by mouth daily.   Multiple Vitamins-Minerals (PRESERVISION AREDS 2) CAPS Take 1 capsule by mouth 2 (two) times daily.   Multiple Vitamins-Minerals (SENIOR MULTIVITAMIN PLUS) TABS Take 1 tablet by mouth daily.    NONFORMULARY OR COMPOUNDED ITEM 1 application 2 (two) times daily as needed. [facility]:  Peripheral Neuropathy Cream - Bupivacaine 1%, Doxepin 3%, Gabapentin 6%, Pentoxifylline 3%, Topiramate 1%, apply 1-2 grams to affected areas 3-4 times daily prn.   Omega-3 Fatty Acids (FISH OIL) 1200 MG CPDR Take 1,200 mg by mouth daily. Take one tablet by mouth once daily   prednisoLONE acetate (PRED FORTE) 1 % ophthalmic suspension Place 1 drop into the right eye daily.   risperiDONE (RISPERDAL) 0.5 MG tablet Take 1 tablet (0.5 mg total) by mouth 2 (two) times daily.   ROCKLATAN 0.02-0.005 % SOLN Place 1 drop into the left eye daily.   sodium chloride (MURO 128) 5 % ophthalmic solution Place 1 drop into the left eye every 4 (four) hours as needed for eye irritation.   vitamin C (ASCORBIC ACID) 500 MG tablet Take 500 mg by mouth daily.   No facility-administered encounter medications on file as of 03/18/2022.    Review of Systems  Constitutional:  Negative for appetite change, chills, fatigue, fever and unexpected weight change.  HENT:  Negative for congestion, dental problem, ear discharge, ear pain, facial swelling, hearing loss, nosebleeds, postnasal drip, rhinorrhea, sinus pressure, sinus pain, sneezing, sore throat, tinnitus and trouble swallowing.   Eyes:  Positive for visual disturbance. Negative for pain, discharge, redness and itching.  Respiratory:  Negative for cough, chest tightness, shortness of breath and wheezing.   Cardiovascular:  Negative for chest pain, palpitations and leg swelling.  Gastrointestinal:  Negative for abdominal distention, abdominal pain, blood in stool, constipation,  diarrhea, nausea and vomiting.  Endocrine: Negative for cold intolerance, heat intolerance, polydipsia, polyphagia and polyuria.  Genitourinary:  Negative for difficulty urinating, dysuria, flank pain, frequency and urgency.  Musculoskeletal:  Positive for gait problem. Negative for arthralgias, back pain, joint swelling, myalgias, neck pain and neck stiffness.  Skin:  Negative for color change, pallor, rash and wound.  Neurological:  Negative for dizziness, syncope, speech difficulty, weakness, light-headedness, numbness and headaches.  Hematological:  Does not bruise/bleed easily.  Psychiatric/Behavioral:  Negative for agitation, behavioral problems, confusion, hallucinations, self-injury, sleep disturbance and suicidal ideas. The patient is not nervous/anxious.     Immunization History  Administered Date(s) Administered   Fluad Quad(high Dose 65+) 06/04/2019, 07/21/2020   Influenza, High Dose Seasonal PF 10/19/2013, 08/02/2014, 08/11/2015, 11/22/2016, 07/16/2017, 11/21/2017, 07/20/2018, 11/19/2018, 11/18/2019, 11/17/2020   Influenza,inj,Quad PF,6+ Mos 06/11/2013, 06/30/2014, 07/17/2015, 06/27/2016   Influenza-Unspecified 07/07/2012   PFIZER(Purple Top)SARS-COV-2 Vaccination 11/20/2019, 12/13/2019, 09/20/2020, 11/17/2020, 07/18/2021   Pfizer Covid-19 Vaccine Bivalent Booster 65yr & up  07/18/2021   Pneumococcal Conjugate-13 11/07/2014   Pneumococcal Polysaccharide-23 10/08/2007, 08/11/2015, 11/22/2016, 11/21/2017, 11/19/2018, 11/18/2019, 11/17/2020   Zoster Recombinat (Shingrix) 07/31/2017, 12/09/2017   Pertinent  Health Maintenance Due  Topic Date Due   INFLUENZA VACCINE  05/07/2022   DEXA SCAN  Completed      11/01/2021    2:29 PM 02/05/2022    1:46 PM 03/11/2022    9:19 PM 03/12/2022   10:02 AM 03/18/2022    3:50 PM  Fall Risk  Falls in the past year? 0 0   0  Was there an injury with Fall? 0 0   0  Fall Risk Category Calculator 0 0   0  Fall Risk Category Low Low   Low  Patient  Fall Risk Level Low fall risk Low fall risk Moderate fall risk Moderate fall risk Low fall risk  Patient at Risk for Falls Due to No Fall Risks No Fall Risks   No Fall Risks  Fall risk Follow up Falls evaluation completed Falls evaluation completed   Falls evaluation completed   Functional Status Survey:    Vitals:   03/18/22 1548  BP: 140/80  Pulse: 62  Resp: 18  Temp: (!) 97.4 F (36.3 C)  TempSrc: Temporal  SpO2: 98%  Weight: 140 lb (63.5 kg)  Height: _0  (1.626 m)   Body mass index is 24.03 kg/m. Physical Exam Vitals reviewed.  Constitutional:      General: She is not in acute distress.    Appearance: Normal appearance. She is normal weight. She is not ill-appearing or diaphoretic.  HENT:     Head: Normocephalic.  Eyes:     General: No scleral icterus.       Right eye: No discharge.        Left eye: No discharge.     Conjunctiva/sclera: Conjunctivae normal.     Pupils: Pupils are equal, round, and reactive to light.  Neck:     Vascular: No carotid bruit.  Cardiovascular:     Rate and Rhythm: Normal rate and regular rhythm.     Pulses: Normal pulses.     Heart sounds: Normal heart sounds. No murmur heard.    No friction rub. No gallop.  Pulmonary:     Effort: Pulmonary effort is normal. No respiratory distress.     Breath sounds: Normal breath sounds. No wheezing, rhonchi or rales.  Chest:     Chest wall: No tenderness.  Abdominal:     General: Bowel sounds are normal. There is no distension.     Palpations: Abdomen is soft. There is no mass.     Tenderness: There is no abdominal tenderness. There is no right CVA tenderness, left CVA tenderness, guarding or rebound.  Musculoskeletal:        General: No swelling or tenderness. Normal range of motion.     Cervical back: Normal range of motion. No rigidity or tenderness.     Right lower leg: No edema.     Left lower leg: No edema.     Comments: On wheelchair during visit  Lymphadenopathy:     Cervical: No  cervical adenopathy.  Skin:    General: Skin is warm and dry.     Coloration: Skin is not pale.     Findings: No bruising, erythema, lesion or rash.  Neurological:     Mental Status: She is alert and oriented to person, place, and time.     Cranial Nerves: No cranial nerve deficit.  Sensory: No sensory deficit.     Motor: No weakness.     Coordination: Coordination normal.     Gait: Gait abnormal.  Psychiatric:        Mood and Affect: Mood normal.        Speech: Speech normal.        Behavior: Behavior normal.     Labs reviewed: Recent Labs    04/23/21 0000 11/13/21 1335 03/12/22 0230  NA 141 140 144  K 4.2 3.6 3.9  CL 109 107 113*  CO2 _0 GLUCOSE 98 107 152*  BUN _1 CREATININE 0.78 0.75 0.77  CALCIUM 9.3 9.5 9.2   Recent Labs    04/23/21 0000 11/13/21 1335 03/12/22 0230  AST _2 ALT _3 ALKPHOS  --   --  73  BILITOT 0.6 1.0 1.0  PROT 6.5 6.8 6.5  ALBUMIN  --   --  3.5   Recent Labs    04/23/21 0000 11/13/21 1335 03/11/22 2250  WBC 4.7 5.4 4.2  NEUTROABS 2,557 2,678 2.1  HGB 11.4* 12.2 8.2*  HCT 35.2 36.2 24.6*  MCV 97.2 96.3 96.9  PLT 204 226 148*   Lab Results  Component Value Date   TSH 1.97 04/23/2021   Lab Results  Component Value Date   HGBA1C 5.6 04/22/2016   Lab Results  Component Value Date   CHOL 135 11/13/2021   HDL 66 11/13/2021   LDLCALC 56 11/13/2021   TRIG 46 11/13/2021   CHOLHDL 2.0 11/13/2021    Significant Diagnostic Results in last 30 days:  DG Chest 2 View  Result Date: 03/11/2022 CLINICAL DATA:  Fatigue and weakness. EXAM: CHEST - 2 VIEW COMPARISON:  November 22, 2016 FINDINGS: The heart size and mediastinal contours are within normal limits. Both lungs are clear. There is moderate severity dextroscoliosis of the mid and lower thoracic spine without an acute osseous abnormality. IMPRESSION: No active cardiopulmonary disease. Electronically Signed   By: Virgina Norfolk M.D.   On: 03/11/2022  22:44   CT Head Wo Contrast  Result Date: 03/11/2022 CLINICAL DATA:  Mental status change, unknown cause. EXAM: CT HEAD WITHOUT CONTRAST TECHNIQUE: Contiguous axial images were obtained from the base of the skull through the vertex without intravenous contrast. RADIATION DOSE REDUCTION: This exam was performed according to the departmental dose-optimization program which includes automated exposure control, adjustment of the mA and/or kV according to patient size and/or use of iterative reconstruction technique. COMPARISON:  03/09/2018. FINDINGS: Brain: No acute intracranial hemorrhage, midline shift or mass effect. No extra-axial fluid collection. Mild atrophy is noted. Subcortical and periventricular white matter hypodensities are noted bilaterally. No hydrocephalus. Vascular: No hyperdense vessel or unexpected calcification. Skull: Normal. Negative for fracture or focal lesion. Sinuses/Orbits: No acute finding. Other: None. IMPRESSION: 1. No acute intracranial process. 2. Atrophy with chronic microvascular ischemic changes. Electronically Signed   By: Brett Fairy M.D.   On: 03/11/2022 22:42    Assessment/Plan  1. Iron deficiency anemia, unspecified iron deficiency anemia type She is status post ED visit 03/11/2022   hgb was low 8.2 previous was 12.2 .FOBT was negative Will recheck CBC/diff  - CBC with Differential/Platelet  2. Fatigue, unspecified type Generalized weakness has improved suspect possible due to anemia.will rule out other metabolic etiologies - BMP with eGFR([facility])  Family/ staff Communication: Reviewed plan of care with patient and son verbalized understanding   Labs/tests ordered:  - CBC with Differential/Platelet -  BMP with eGFR([facility])  Next Appointment: Return if symptoms worsen or fail to improve.   Sandrea Hughs, NP

## 2022-03-19 LAB — CBC WITH DIFFERENTIAL/PLATELET
Absolute Monocytes: 558 cells/uL (ref 200–950)
Basophils Absolute: 31 cells/uL (ref 0–200)
Basophils Relative: 0.5 %
Eosinophils Absolute: 68 cells/uL (ref 15–500)
Eosinophils Relative: 1.1 %
HCT: 36.4 % (ref 35.0–45.0)
Hemoglobin: 12 g/dL (ref 11.7–15.5)
Lymphs Abs: 1736 cells/uL (ref 850–3900)
MCH: 31.5 pg (ref 27.0–33.0)
MCHC: 33 g/dL (ref 32.0–36.0)
MCV: 95.5 fL (ref 80.0–100.0)
MPV: 10.5 fL (ref 7.5–12.5)
Monocytes Relative: 9 %
Neutro Abs: 3807 cells/uL (ref 1500–7800)
Neutrophils Relative %: 61.4 %
Platelets: 248 10*3/uL (ref 140–400)
RBC: 3.81 10*6/uL (ref 3.80–5.10)
RDW: 13 % (ref 11.0–15.0)
Total Lymphocyte: 28 %
WBC: 6.2 10*3/uL (ref 3.8–10.8)

## 2022-03-19 LAB — BASIC METABOLIC PANEL WITH GFR
BUN: 10 mg/dL (ref 7–25)
CO2: 25 mmol/L (ref 20–32)
Calcium: 9.6 mg/dL (ref 8.6–10.4)
Chloride: 107 mmol/L (ref 98–110)
Creat: 0.74 mg/dL (ref 0.60–0.95)
Glucose, Bld: 98 mg/dL (ref 65–139)
Potassium: 4.2 mmol/L (ref 3.5–5.3)
Sodium: 142 mmol/L (ref 135–146)
eGFR: 74 mL/min/{1.73_m2} (ref >=60)

## 2022-03-25 DIAGNOSIS — M170 Bilateral primary osteoarthritis of knee: Secondary | ICD-10-CM | POA: Diagnosis not present

## 2022-03-25 DIAGNOSIS — J45909 Unspecified asthma, uncomplicated: Secondary | ICD-10-CM | POA: Diagnosis not present

## 2022-03-25 DIAGNOSIS — K219 Gastro-esophageal reflux disease without esophagitis: Secondary | ICD-10-CM | POA: Diagnosis not present

## 2022-03-25 DIAGNOSIS — I10 Essential (primary) hypertension: Secondary | ICD-10-CM | POA: Diagnosis not present

## 2022-03-25 DIAGNOSIS — G629 Polyneuropathy, unspecified: Secondary | ICD-10-CM | POA: Diagnosis not present

## 2022-03-25 DIAGNOSIS — I739 Peripheral vascular disease, unspecified: Secondary | ICD-10-CM | POA: Diagnosis not present

## 2022-04-05 DIAGNOSIS — I739 Peripheral vascular disease, unspecified: Secondary | ICD-10-CM | POA: Diagnosis not present

## 2022-04-05 DIAGNOSIS — J45909 Unspecified asthma, uncomplicated: Secondary | ICD-10-CM | POA: Diagnosis not present

## 2022-04-05 DIAGNOSIS — I10 Essential (primary) hypertension: Secondary | ICD-10-CM | POA: Diagnosis not present

## 2022-04-05 DIAGNOSIS — K219 Gastro-esophageal reflux disease without esophagitis: Secondary | ICD-10-CM | POA: Diagnosis not present

## 2022-04-05 DIAGNOSIS — M170 Bilateral primary osteoarthritis of knee: Secondary | ICD-10-CM | POA: Diagnosis not present

## 2022-04-05 DIAGNOSIS — G629 Polyneuropathy, unspecified: Secondary | ICD-10-CM | POA: Diagnosis not present

## 2022-04-07 ENCOUNTER — Other Ambulatory Visit: Payer: Self-pay | Admitting: Family

## 2022-04-10 DIAGNOSIS — Z79899 Other long term (current) drug therapy: Secondary | ICD-10-CM | POA: Diagnosis not present

## 2022-04-10 DIAGNOSIS — G629 Polyneuropathy, unspecified: Secondary | ICD-10-CM | POA: Diagnosis not present

## 2022-04-10 DIAGNOSIS — Z7902 Long term (current) use of antithrombotics/antiplatelets: Secondary | ICD-10-CM | POA: Diagnosis not present

## 2022-04-10 DIAGNOSIS — K219 Gastro-esophageal reflux disease without esophagitis: Secondary | ICD-10-CM | POA: Diagnosis not present

## 2022-04-10 DIAGNOSIS — Z7951 Long term (current) use of inhaled steroids: Secondary | ICD-10-CM | POA: Diagnosis not present

## 2022-04-10 DIAGNOSIS — M170 Bilateral primary osteoarthritis of knee: Secondary | ICD-10-CM | POA: Diagnosis not present

## 2022-04-10 DIAGNOSIS — Z9181 History of falling: Secondary | ICD-10-CM | POA: Diagnosis not present

## 2022-04-10 DIAGNOSIS — H3530 Unspecified macular degeneration: Secondary | ICD-10-CM | POA: Diagnosis not present

## 2022-04-10 DIAGNOSIS — Z8673 Personal history of transient ischemic attack (TIA), and cerebral infarction without residual deficits: Secondary | ICD-10-CM | POA: Diagnosis not present

## 2022-04-10 DIAGNOSIS — H409 Unspecified glaucoma: Secondary | ICD-10-CM | POA: Diagnosis not present

## 2022-04-10 DIAGNOSIS — J45909 Unspecified asthma, uncomplicated: Secondary | ICD-10-CM | POA: Diagnosis not present

## 2022-04-10 DIAGNOSIS — I739 Peripheral vascular disease, unspecified: Secondary | ICD-10-CM | POA: Diagnosis not present

## 2022-04-10 DIAGNOSIS — H548 Legal blindness, as defined in USA: Secondary | ICD-10-CM | POA: Diagnosis not present

## 2022-04-10 DIAGNOSIS — F419 Anxiety disorder, unspecified: Secondary | ICD-10-CM | POA: Diagnosis not present

## 2022-04-10 DIAGNOSIS — I10 Essential (primary) hypertension: Secondary | ICD-10-CM | POA: Diagnosis not present

## 2022-04-22 DIAGNOSIS — I739 Peripheral vascular disease, unspecified: Secondary | ICD-10-CM | POA: Diagnosis not present

## 2022-04-22 DIAGNOSIS — M170 Bilateral primary osteoarthritis of knee: Secondary | ICD-10-CM | POA: Diagnosis not present

## 2022-04-22 DIAGNOSIS — I10 Essential (primary) hypertension: Secondary | ICD-10-CM | POA: Diagnosis not present

## 2022-04-22 DIAGNOSIS — K219 Gastro-esophageal reflux disease without esophagitis: Secondary | ICD-10-CM | POA: Diagnosis not present

## 2022-04-22 DIAGNOSIS — G629 Polyneuropathy, unspecified: Secondary | ICD-10-CM | POA: Diagnosis not present

## 2022-04-22 DIAGNOSIS — J45909 Unspecified asthma, uncomplicated: Secondary | ICD-10-CM | POA: Diagnosis not present

## 2022-04-29 DIAGNOSIS — M170 Bilateral primary osteoarthritis of knee: Secondary | ICD-10-CM | POA: Diagnosis not present

## 2022-04-29 DIAGNOSIS — G629 Polyneuropathy, unspecified: Secondary | ICD-10-CM | POA: Diagnosis not present

## 2022-04-29 DIAGNOSIS — J45909 Unspecified asthma, uncomplicated: Secondary | ICD-10-CM | POA: Diagnosis not present

## 2022-04-29 DIAGNOSIS — I739 Peripheral vascular disease, unspecified: Secondary | ICD-10-CM | POA: Diagnosis not present

## 2022-04-29 DIAGNOSIS — K219 Gastro-esophageal reflux disease without esophagitis: Secondary | ICD-10-CM | POA: Diagnosis not present

## 2022-04-29 DIAGNOSIS — I10 Essential (primary) hypertension: Secondary | ICD-10-CM | POA: Diagnosis not present

## 2022-05-02 ENCOUNTER — Ambulatory Visit: Payer: Medicare Other | Admitting: Family

## 2022-05-10 DIAGNOSIS — G629 Polyneuropathy, unspecified: Secondary | ICD-10-CM | POA: Diagnosis not present

## 2022-05-10 DIAGNOSIS — Z7951 Long term (current) use of inhaled steroids: Secondary | ICD-10-CM | POA: Diagnosis not present

## 2022-05-10 DIAGNOSIS — H548 Legal blindness, as defined in USA: Secondary | ICD-10-CM | POA: Diagnosis not present

## 2022-05-10 DIAGNOSIS — M170 Bilateral primary osteoarthritis of knee: Secondary | ICD-10-CM | POA: Diagnosis not present

## 2022-05-10 DIAGNOSIS — Z9181 History of falling: Secondary | ICD-10-CM | POA: Diagnosis not present

## 2022-05-10 DIAGNOSIS — Z79899 Other long term (current) drug therapy: Secondary | ICD-10-CM | POA: Diagnosis not present

## 2022-05-10 DIAGNOSIS — H3530 Unspecified macular degeneration: Secondary | ICD-10-CM | POA: Diagnosis not present

## 2022-05-10 DIAGNOSIS — J45909 Unspecified asthma, uncomplicated: Secondary | ICD-10-CM | POA: Diagnosis not present

## 2022-05-10 DIAGNOSIS — F419 Anxiety disorder, unspecified: Secondary | ICD-10-CM | POA: Diagnosis not present

## 2022-05-10 DIAGNOSIS — I739 Peripheral vascular disease, unspecified: Secondary | ICD-10-CM | POA: Diagnosis not present

## 2022-05-10 DIAGNOSIS — Z7902 Long term (current) use of antithrombotics/antiplatelets: Secondary | ICD-10-CM | POA: Diagnosis not present

## 2022-05-10 DIAGNOSIS — H409 Unspecified glaucoma: Secondary | ICD-10-CM | POA: Diagnosis not present

## 2022-05-10 DIAGNOSIS — K219 Gastro-esophageal reflux disease without esophagitis: Secondary | ICD-10-CM | POA: Diagnosis not present

## 2022-05-10 DIAGNOSIS — I10 Essential (primary) hypertension: Secondary | ICD-10-CM | POA: Diagnosis not present

## 2022-05-10 DIAGNOSIS — Z8673 Personal history of transient ischemic attack (TIA), and cerebral infarction without residual deficits: Secondary | ICD-10-CM | POA: Diagnosis not present

## 2022-05-21 DIAGNOSIS — I739 Peripheral vascular disease, unspecified: Secondary | ICD-10-CM | POA: Diagnosis not present

## 2022-05-21 DIAGNOSIS — G629 Polyneuropathy, unspecified: Secondary | ICD-10-CM | POA: Diagnosis not present

## 2022-05-21 DIAGNOSIS — M170 Bilateral primary osteoarthritis of knee: Secondary | ICD-10-CM | POA: Diagnosis not present

## 2022-05-21 DIAGNOSIS — K219 Gastro-esophageal reflux disease without esophagitis: Secondary | ICD-10-CM | POA: Diagnosis not present

## 2022-05-21 DIAGNOSIS — J45909 Unspecified asthma, uncomplicated: Secondary | ICD-10-CM | POA: Diagnosis not present

## 2022-05-21 DIAGNOSIS — I10 Essential (primary) hypertension: Secondary | ICD-10-CM | POA: Diagnosis not present

## 2022-05-24 ENCOUNTER — Ambulatory Visit (INDEPENDENT_AMBULATORY_CARE_PROVIDER_SITE_OTHER): Payer: Medicare Other | Admitting: Family

## 2022-05-24 ENCOUNTER — Encounter: Payer: Self-pay | Admitting: Family

## 2022-05-24 VITALS — BP 140/88 | HR 72 | Temp 97.1°F | Resp 16 | Ht 64.0 in

## 2022-05-24 DIAGNOSIS — H6122 Impacted cerumen, left ear: Secondary | ICD-10-CM

## 2022-05-24 DIAGNOSIS — J4520 Mild intermittent asthma, uncomplicated: Secondary | ICD-10-CM

## 2022-05-24 DIAGNOSIS — I10 Essential (primary) hypertension: Secondary | ICD-10-CM | POA: Diagnosis not present

## 2022-05-24 DIAGNOSIS — E785 Hyperlipidemia, unspecified: Secondary | ICD-10-CM

## 2022-05-24 DIAGNOSIS — K219 Gastro-esophageal reflux disease without esophagitis: Secondary | ICD-10-CM | POA: Diagnosis not present

## 2022-05-24 MED ORDER — DEBROX 6.5 % OT SOLN: 5.0000 [drp] | Freq: Two times a day (BID) | OTIC | 0 refills | Status: AC

## 2022-05-24 NOTE — Patient Instructions (Signed)
-   Instill debrox 6.5 otic solution 5 drops into left  ear twice daily x 4 days then follow up for ear lavage.May apply cotton ball at bedtime to prevent drainage to pillow.

## 2022-05-24 NOTE — Progress Notes (Unsigned)
Provider: Marlowe Sax FNP-C   [name] [name], Nelda Bucks, NP  Patient Care Team: Jarae Nemmers, Nelda Bucks, NP as PCP - General (Family Medicine) Sueanne Margarita, MD as PCP - Cardiology (Cardiology) Trula Slade, DPM as Consulting Physician (Podiatry) Bond, Tracie Harrier, MD as Referring Physician (Ophthalmology)  Extended Emergency Contact Information Primary Emergency Contact: Spaugh,Carl          [place] [address] [place] Phone: [phone] Mobile Phone: [phone] Relation: Son Secondary Emergency Contact: Stormy Fabian [place] Phone: [phone] Mobile Phone: [phone] Relation: Daughter  Code Status:  Full Code  Goals of care: Advanced Directive information    05/24/2022    2:44 PM  Advanced Directives  Does Patient Have a Medical Advance Directive? No  Would patient like information on creating a medical advance directive? No - Patient declined     Chief Complaint  Patient presents with   Medical Management of Chronic Issues    6 month follow up.    Immunizations    Discuss the need for Tetanus vaccine, Covid Booster, and Influenza vaccine.     HPI:  Pt is a 88 y.o. female seen today for medical management of chronic diseases.     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   Edema extremities    GERD (gastroesophageal reflux disease)    Glaucoma    Macular degeneration    Per patient    Osteoarthritis    PVD (peripheral vascular disease) ([place])    Seasonal allergies    Stroke [facility])    Past Surgical History:  Procedure Laterality Date   ABDOMINAL HYSTERECTOMY  1981   TONSILLECTOMY  1953    Allergies  Allergen Reactions   Aspirin Other (See Comments)    sweats   Codeine Other (See Comments)   Iodine Other (See Comments)    Pt is not aware of this allergy, does not recall much info.   Prednisone Other (See  Comments)    sweats   Lyrica [Pregabalin]     Wobbly, "more than sleepy"   Benzonatate Itching and Rash   Sulfa Antibiotics Other (See Comments)    Doesn't remember Other reaction(s): Unknown    Allergies as of 05/24/2022       Reactions   Aspirin Other (See Comments)   sweats   Codeine Other (See Comments)   Iodine Other (See Comments)   Pt is not aware of this allergy, does not recall much info.   Prednisone Other (See Comments)   sweats   Lyrica [pregabalin]    Wobbly, "more than sleepy"   Benzonatate Itching, Rash   Sulfa Antibiotics Other (See Comments)   Doesn't remember Other reaction(s): Unknown        Medication List        Accurate as of May 24, 2022  3:29 PM. If you have any questions, ask your nurse or doctor.          acetaminophen 500 MG tablet Commonly known as: TYLENOL Take 500 mg by mouth every 6 (six) hours as needed for mild pain or moderate pain.   albuterol 108 (90 Base) MCG/ACT inhaler Commonly known as: VENTOLIN HFA Inhale 2 puffs into the lungs every 6 (six) hours as needed for wheezing.   alum hydroxide-mag trisilicate 06-26 MG Chew chewable tablet Commonly known as: GAVISCON Chew 2 tablets by mouth 2 (  two) times daily.   amLODipine 5 MG tablet Commonly known as: NORVASC Take 1 tablet (5 mg total) by mouth daily. Please keep your 09/03/21 appt with Dr. Radford Pax.   ascorbic acid 500 MG tablet Commonly known as: VITAMIN C Take 500 mg by mouth daily.   atorvastatin 10 MG tablet Commonly known as: LIPITOR TAKE 1 TABLET BY MOUTH EVERY DAY   atropine 1 % ophthalmic solution Place 1 drop into the right eye daily.   Brinzolamide-Brimonidine 1-0.2 % Susp Apply 1 drop to eye 3 (three) times daily.   budesonide-formoterol 80-4.5 MCG/ACT inhaler Commonly known as: SYMBICORT Inhale 2 puffs into the lungs 2 (two) times daily.   cetaphil cream Apply topically 2 (two) times daily. To legs   clopidogrel 75 MG tablet Commonly known  as: PLAVIX TAKE 1 TABLET BY MOUTH EVERY DAY   CoQ10 50 MG Caps Take 50 mg by mouth daily.   docusate sodium 100 MG capsule Commonly known as: COLACE TAKE 1 CAPSULE BY MOUTH TWICE A DAY   Fish Oil 1200 MG Cpdr Take 1,200 mg by mouth daily. Take one tablet by mouth once daily   fluticasone 50 MCG/ACT nasal spray Commonly known as: FLONASE Place 1 spray into the nose daily.   hydrocortisone 2.5 % rectal cream Commonly known as: Anusol-HC Place 1 application rectally daily as needed for hemorrhoids or anal itching.   loratadine 10 MG tablet Commonly known as: CLARITIN Take 10 mg by mouth daily.   NONFORMULARY OR COMPOUNDED ITEM 1 application 2 (two) times daily as needed. [place]:  Peripheral Neuropathy Cream - Bupivacaine 1%, Doxepin 3%, Gabapentin 6%, Pentoxifylline 3%, Topiramate 1%, apply 1-2 grams to affected areas 3-4 times daily prn.   prednisoLONE acetate 1 % ophthalmic suspension Commonly known as: PRED FORTE Place 1 drop into the right eye daily.   risperiDONE 0.5 MG tablet Commonly known as: RISPERDAL Take 1 tablet (0.5 mg total) by mouth 2 (two) times daily.   Rocklatan 0.02-0.005 % Soln Generic drug: Netarsudil-Latanoprost Place 1 drop into the left eye daily.   PreserVision AREDS 2 Caps Take 1 capsule by mouth 2 (two) times daily.   Senior Multivitamin Plus Tabs Take 1 tablet by mouth daily.   sodium chloride 5 % ophthalmic solution Commonly known as: MURO 128 Place 1 drop into the left eye every 4 (four) hours as needed for eye irritation.        Review of Systems  Immunization History  Administered Date(s) Administered   Fluad Quad(high Dose 65+) 06/04/2019, 07/21/2020   Influenza, High Dose Seasonal PF 10/19/2013, 08/02/2014, 08/11/2015, 11/22/2016, 07/16/2017, 11/21/2017, 07/20/2018, 11/19/2018, 11/18/2019, 11/17/2020, 11/28/2021   Influenza,inj,Quad PF,6+ Mos 06/11/2013, 06/30/2014, 07/17/2015, 06/27/2016   Influenza-Unspecified  07/07/2012   PFIZER(Purple Top)SARS-COV-2 Vaccination 11/20/2019, 12/13/2019, 09/20/2020, 11/17/2020, 07/18/2021   Pfizer Covid-19 Vaccine Bivalent Booster 48yr & up 07/18/2021   Pneumococcal Conjugate-13 11/07/2014   Pneumococcal Polysaccharide-23 10/08/2007, 08/11/2015, 11/22/2016, 11/21/2017, 11/19/2018, 11/18/2019, 11/17/2020, 11/28/2021   Zoster Recombinat (Shingrix) 07/31/2017, 12/09/2017   Pertinent  Health Maintenance Due  Topic Date Due   INFLUENZA VACCINE  05/07/2022   DEXA SCAN  Completed      02/05/2022    1:46 PM 03/11/2022    9:19 PM 03/12/2022   10:02 AM 03/18/2022    3:50 PM 05/24/2022    2:44 PM  [place] past [place]? 0   0 0  Was there an injury with Fall? 0   0 0  Fall Risk Category Calculator  0   0 0  Fall Risk Category Low   Low Low  Patient Fall Risk Level Low fall risk Moderate fall risk Moderate fall risk Low fall risk Low fall risk  Patient at Risk for Falls Due to No Fall Risks   No Fall Risks No Fall Risks  Fall risk Follow up Falls evaluation completed   Falls evaluation completed Falls evaluation completed   Functional Status Survey:    Vitals:   05/24/22 1432  BP: (!) 140/88  Pulse: 72  Resp: 16  Temp: (!) 97.1 F (36.2 C)  SpO2: 98%  Height: '5\' 4"'$  (1.626 m)   Body mass index is 24.03 kg/m. Physical Exam  Labs reviewed: Recent Labs    11/13/21 1335 03/12/22 0230 03/18/22 1624  NA 140 144 142  K 3.6 3.9 4.2  CL 107 113* 107  CO2 '26 26 25  '$ GLUCOSE 107 152* 98  BUN '16 15 10  '$ CREATININE 0.75 0.77 0.74  CALCIUM 9.5 9.2 9.6   Recent Labs    11/13/21 1335 03/12/22 0230  AST 23 23  ALT 21 19  ALKPHOS  --  73  BILITOT 1.0 1.0  PROT 6.8 6.5  ALBUMIN  --  3.5   Recent Labs    11/13/21 1335 03/11/22 2250 03/18/22 1624  WBC 5.4 4.2 6.2  NEUTROABS 2,678 2.1 3,807  HGB 12.2 8.2* 12.0  HCT 36.2 24.6* 36.4  MCV 96.3 96.9 95.5  PLT 226 148* 248   Lab Results  Component Value Date   TSH 1.97 04/23/2021   Lab  Results  Component Value Date   HGBA1C 5.6 04/22/2016   Lab Results  Component Value Date   CHOL 135 11/13/2021   HDL 66 11/13/2021   LDLCALC 56 11/13/2021   TRIG 46 11/13/2021   CHOLHDL 2.0 11/13/2021    Significant Diagnostic Results in last 30 days:  No results found.  Assessment/Plan There are no diagnoses linked to this encounter.   Family/ staff Communication: Reviewed plan of care with patient  Labs/tests ordered: None   Next Appointment :   Sandrea Hughs, NP

## 2022-05-26 ENCOUNTER — Other Ambulatory Visit: Payer: Self-pay | Admitting: Cardiology

## 2022-05-26 ENCOUNTER — Other Ambulatory Visit: Payer: Self-pay | Admitting: Family

## 2022-05-27 NOTE — Telephone Encounter (Signed)
High risk or very high risk warning populated when attempting to refill medication. RX request sent to PCP for review and approval if warranted.   

## 2022-05-31 ENCOUNTER — Ambulatory Visit: Payer: Medicare Other | Admitting: Family

## 2022-05-31 DIAGNOSIS — K219 Gastro-esophageal reflux disease without esophagitis: Secondary | ICD-10-CM | POA: Diagnosis not present

## 2022-05-31 DIAGNOSIS — I10 Essential (primary) hypertension: Secondary | ICD-10-CM | POA: Diagnosis not present

## 2022-05-31 DIAGNOSIS — G629 Polyneuropathy, unspecified: Secondary | ICD-10-CM | POA: Diagnosis not present

## 2022-05-31 DIAGNOSIS — M170 Bilateral primary osteoarthritis of knee: Secondary | ICD-10-CM | POA: Diagnosis not present

## 2022-05-31 DIAGNOSIS — I739 Peripheral vascular disease, unspecified: Secondary | ICD-10-CM | POA: Diagnosis not present

## 2022-05-31 DIAGNOSIS — J45909 Unspecified asthma, uncomplicated: Secondary | ICD-10-CM | POA: Diagnosis not present

## 2022-06-18 ENCOUNTER — Ambulatory Visit (INDEPENDENT_AMBULATORY_CARE_PROVIDER_SITE_OTHER): Payer: Medicare Other | Admitting: Podiatry

## 2022-06-18 ENCOUNTER — Encounter: Payer: Self-pay | Admitting: Podiatry

## 2022-06-18 DIAGNOSIS — M79674 Pain in right toe(s): Secondary | ICD-10-CM

## 2022-06-18 DIAGNOSIS — G5793 Unspecified mononeuropathy of bilateral lower limbs: Secondary | ICD-10-CM

## 2022-06-18 DIAGNOSIS — B351 Tinea unguium: Secondary | ICD-10-CM | POA: Diagnosis not present

## 2022-06-18 DIAGNOSIS — M79675 Pain in left toe(s): Secondary | ICD-10-CM | POA: Diagnosis not present

## 2022-06-21 ENCOUNTER — Other Ambulatory Visit: Payer: Self-pay | Admitting: Family

## 2022-06-22 NOTE — Progress Notes (Signed)
  Subjective:  [name], female    DOB: 30-Nov-1923,  MRN: [id number]  [name] [name] presents to clinic today for painful elongated mycotic toenails 1-5 bilaterally which are tender when wearing enclosed shoe gear. Pain is relieved with periodic professional debridement.  New problem(s): None.   PCP is Ngetich, Nelda Bucks, NP , and last visit was  May 24, 2022.  Allergies  Allergen Reactions   Aspirin Other (See Comments)    sweats   Codeine Other (See Comments)   Iodine Other (See Comments)    Pt is not aware of this allergy, does not recall much info.   Prednisone Other (See Comments)    sweats   Lyrica [Pregabalin]     Wobbly, "more than sleepy"   Benzonatate Itching and Rash   Sulfa Antibiotics Other (See Comments)    Doesn't remember Other reaction(s): Unknown    Review of Systems: Negative except as noted in the HPI.  Objective: No changes noted in today's physical examination. [name] [name] is a pleasant 88 y.o. female in NAD. AAO x 3.  Neurovascular Examination: CFT <3 seconds b/l LE. Palpable DP pulse(s) b/l LE. Palpable PT pulse(s) b/l LE. Pedal hair absent. No pain with calf compression b/l. Lower extremity skin temperature gradient within normal limits. Dependent edema noted b/l LE. No cyanosis or clubbing noted b/l LE.  Pt has subjective symptoms of neuropathy. Protective sensation intact 5/5 intact bilaterally with 10g monofilament b/l.  Dermatological:  Pedal integument with normal turgor, texture and tone BLE. No open wounds b/l LE.  No interdigital macerations noted b/l LE. Toenails 1-5 b/l elongated, discolored, dystrophic, thickened, crumbly with subungual debris and tenderness to dorsal palpation.   No hyperkeratosis noted left 5th toe today.  Musculoskeletal:  Muscle strength 5/5 to all lower extremity muscle groups bilaterally. No pain, crepitus or joint limitation noted with ROM bilateral LE. Hammertoe(s) noted to the L 4th toe.  Utilizes wheelchair for mobility assistance.  Assessment/Plan: 1. Pain due to onychomycosis of toenails of both feet   2. Neuropathy of both feet   -Examined patient. -No new findings. No new orders. -Mycotic toenails 1-5 bilaterally were debrided in length and girth with sterile nail nippers and dremel without incident. -Patient/POA to call should there be question/concern in the interim.   Return in about 3 months (around 09/17/2022).  Marzetta Board, DPM

## 2022-07-06 ENCOUNTER — Other Ambulatory Visit: Payer: Self-pay | Admitting: Family

## 2022-07-20 ENCOUNTER — Other Ambulatory Visit: Payer: Self-pay | Admitting: Family

## 2022-07-22 NOTE — Telephone Encounter (Signed)
Patient has request refill on medication Risperidone 0.'5mg'$ . Patient last refill dated 05/27/2022. Patient medication has Allergy Contraindications. Medication pend and sent to Sherrie Mustache, NP due to PCP Ngetich, Nelda Bucks, NP being out of office.

## 2022-08-02 ENCOUNTER — Other Ambulatory Visit: Payer: Self-pay | Admitting: *Deleted

## 2022-08-02 MED ORDER — NONFORMULARY OR COMPOUNDED ITEM: 1 refills | Status: DC

## 2022-08-02 NOTE — Telephone Encounter (Signed)
[place] requested refill.  Pended Rx and sent to [facility] for approval.

## 2022-08-05 DIAGNOSIS — H401113 Primary open-angle glaucoma, right eye, severe stage: Secondary | ICD-10-CM | POA: Diagnosis not present

## 2022-08-05 DIAGNOSIS — H401124 Primary open-angle glaucoma, left eye, indeterminate stage: Secondary | ICD-10-CM | POA: Diagnosis not present

## 2022-09-03 ENCOUNTER — Ambulatory Visit (INDEPENDENT_AMBULATORY_CARE_PROVIDER_SITE_OTHER): Payer: Medicare Other | Admitting: Family

## 2022-09-03 ENCOUNTER — Encounter: Payer: Self-pay | Admitting: Family

## 2022-09-03 DIAGNOSIS — Z0000 Encounter for general adult medical examination without abnormal findings: Secondary | ICD-10-CM

## 2022-09-03 NOTE — Progress Notes (Signed)
This service is provided via telemedicine  No vital signs collected/recorded due to the encounter was a telemedicine visit.   Location of patient (ex: home, work):  Home.  Patient consents to a telephone visit:  Yes  Location of the provider (ex: office, home):  [employer].   Name of any referring provider:  Laurance Heide, Nelda Bucks, NP   Names of all persons participating in the telemedicine service and their role in the encounter:  Patient, Heriberto Antigua, [place], Frank, Webb Silversmith, NP.    Time spent on call: 8 minutes spent on the phone with Medical Assistant.      Subjective:   [name] [name] is a 88 y.o. female who presents for Medicare Annual (Subsequent) preventive examination.  Review of Systems     Cardiac Risk Factors include: advanced age (>97mn, >>78women);hypertension     Objective:    There were no vitals filed for this visit. There is no height or weight on file to calculate BMI.     09/03/2022   11:04 AM 05/24/2022    2:44 PM 11/01/2021    2:29 PM 08/28/2021   11:14 AM 02/09/2021    1:08 PM 10/05/2020    1:09 PM 08/25/2020   10:22 AM  Advanced Directives  Does Patient Have a Medical Advance Directive? Yes No No No Yes Yes Yes  Type of ASystems analystof facility DNR (pink MOST or yellow form)  Out of facility DNR (pink MOST or yellow form)  Does patient want to make changes to medical advance directive? No - Patient declined    No - Patient declined No - Patient declined No - Patient declined  Copy of HHarrisonin Chart? No - copy requested        Would patient like information on creating a medical advance directive?  No - Patient declined No - Patient declined        Current Medications (verified) Outpatient Encounter Medications as of 09/03/2022  Medication Sig   acetaminophen (TYLENOL) 500 MG tablet Take 500 mg by mouth every 6 (six) hours as needed for mild pain or moderate pain.     albuterol (PROVENTIL HFA;VENTOLIN HFA) 108 (90 Base) MCG/ACT inhaler Inhale 2 puffs into the lungs every 6 (six) hours as needed for wheezing.   alum hydroxide-mag trisilicate (GAVISCON) 817-49MG CHEW chewable tablet Chew 2 tablets by mouth 2 (two) times daily.   amLODipine (NORVASC) 5 MG tablet TAKE 1 TABLET (5 MG TOTAL) BY MOUTH DAILY. .Marland Kitchen  atorvastatin (LIPITOR) 10 MG tablet TAKE 1 TABLET BY MOUTH EVERY DAY   atropine 1 % ophthalmic solution Place 1 drop into the right eye daily.    Brinzolamide-Brimonidine 1-0.2 % SUSP Apply 1 drop to eye 3 (three) times daily.   budesonide-formoterol (SYMBICORT) 80-4.5 MCG/ACT inhaler Inhale 2 puffs into the lungs 2 (two) times daily.   clopidogrel (PLAVIX) 75 MG tablet TAKE 1 TABLET BY MOUTH EVERY DAY   Coenzyme Q10 (COQ10) 50 MG CAPS Take 50 mg by mouth daily.   docusate sodium (COLACE) 100 MG capsule TAKE 1 CAPSULE BY MOUTH TWICE A DAY   Emollient (CETAPHIL) cream Apply topically 2 (two) times daily. To legs   fluticasone (FLONASE) 50 MCG/ACT nasal spray Place 1 spray into the nose daily.   hydrocortisone (ANUSOL-HC) 2.5 % rectal cream Place 1 application rectally daily as needed for hemorrhoids or anal itching.   loratadine (CLARITIN) 10  MG tablet Take 10 mg by mouth daily.   Multiple Vitamins-Minerals (PRESERVISION AREDS 2) CAPS Take 1 capsule by mouth 2 (two) times daily.   Multiple Vitamins-Minerals (SENIOR MULTIVITAMIN PLUS) TABS Take 1 tablet by mouth daily.    NONFORMULARY OR COMPOUNDED ITEM [facility]:  Peripheral Neuropathy Cream - Bupivacaine 1%, Doxepin 3%, Gabapentin 6%, Pentoxifylline 3%, Topiramate 1%, apply 1-2 grams to affected areas 3-4 times daily prn.   Omega-3 Fatty Acids (FISH OIL) 1200 MG CPDR Take 1,200 mg by mouth daily. Take one tablet by mouth once daily   prednisoLONE acetate (PRED FORTE) 1 % ophthalmic suspension Place 1 drop into the right eye daily.   risperiDONE (RISPERDAL) 0.5 MG tablet TAKE 1 TABLET BY MOUTH TWICE A  DAY   ROCKLATAN 0.02-0.005 % SOLN Place 1 drop into the left eye daily.   sodium chloride (MURO 128) 5 % ophthalmic solution Place 1 drop into the left eye every 4 (four) hours as needed for eye irritation.   vitamin C (ASCORBIC ACID) 500 MG tablet Take 500 mg by mouth daily.   No facility-administered encounter medications on file as of 09/03/2022.    Allergies (verified) Aspirin, Codeine, Iodine, Prednisone, Lyrica [pregabalin], Benzonatate, and Sulfa antibiotics   History: 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   Edema extremities    GERD (gastroesophageal reflux disease)    Glaucoma    Macular degeneration    Per patient    Osteoarthritis    PVD (peripheral vascular disease) ([place])    Seasonal allergies    Stroke [facility])    Past Surgical History:  Procedure Laterality Date   ABDOMINAL HYSTERECTOMY  1981   TONSILLECTOMY  1953   Family History  Problem Relation Age of Onset   Stroke Father    Hypertension Father    Heart disease Mother    Hypertension Mother    Diabetes Mother    Social History   Socioeconomic History   Marital status: Widowed    Spouse name: Not on file   Number of children: Not on file   Years of education: Not on file   Highest education level: Not on file  Occupational History   Not on file  Tobacco Use   Smoking status: Never   Smokeless tobacco: Never  Vaping Use   Vaping Use: Never used  Substance and Sexual Activity   Alcohol use: No    Alcohol/week: 0.0 standard drinks of alcohol   Drug use: No   Sexual activity: Not Currently  Other Topics Concern   Not on file  Social History Narrative   Was cosmetologist, 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, does not have pets, has living will   Social Determinants of Health   Financial Resource Strain: [place]  (07/31/2018)   Overall Financial Resource Strain (CARDIA)    Difficulty of Paying Living  Expenses: Not hard at all  Food Insecurity: No Food Insecurity (07/31/2018)   Hunger Vital Sign    Worried About Running Out of Food in the Last Year: Never true    [place] in the Last Year: Never true  Transportation Needs: No Transportation Needs (07/31/2018)   PRAPARE - Hydrologist (Medical): No    Lack of Transportation (Non-Medical): No  Physical Activity: Insufficiently Active (07/31/2018)   Exercise Vital Sign    Days of Exercise  per Week: 3 days    Minutes of Exercise per Session: 30 min  Stress: No Stress Concern Present (07/31/2018)   [place]    Feeling of Stress : Not at all  Social Connections: Somewhat Isolated (07/31/2018)   Social Connection and Isolation Panel [NHANES]    Frequency of Communication with Friends and Family: More than three times a week    Frequency of Social Gatherings with Friends and Family: More than three times a week    Attends Religious Services: More than 4 times per year    Active Member of [employer] or Organizations: No    Attends Archivist Meetings: Never    Marital Status: Widowed    Tobacco Counseling Counseling given: Not Answered   Clinical Intake:  Pre-visit preparation completed: No  Pain : No/denies pain     BMI - recorded: 24.03 Nutritional Status: BMI of 19-24  Normal Nutritional Risks: None Diabetes: No  How often do you need to have someone help you when you read instructions, pamphlets, or other written materials from your doctor or pharmacy?: 5 - Always (son assist) What is the last grade level you completed in school?: 12 grade  Diabetic?No   Interpreter Needed?: No      Activities of Daily Living    09/03/2022   11:47 AM  In your present state of health, do you have any difficulty performing the following activities:  Hearing? 0  Vision? 1  Difficulty concentrating or making decisions? 1   Comment [place]  Walking or climbing stairs? 1  Comment on wheelchair  Dressing or bathing? 1  Doing errands, shopping? 1  Comment son [place] and eating ? Y  Comment son assist  Using the Toilet? Y  Comment constipation  In the past six months, have you accidently leaked urine? Y  Do you have problems with loss of bowel control? N  Managing your Medications? Y  Managing your Finances? Y  Comment son assist  Housekeeping or managing your Housekeeping? Y  Comment son assist    Patient Care Team: Bartosz Luginbill, Nelda Bucks, NP as PCP - General (Family Medicine) Sueanne Margarita, MD as PCP - Cardiology (Cardiology) Trula Slade, DPM as Consulting Physician (Podiatry) Bond, Tracie Harrier, MD as Referring Physician (Ophthalmology)  Indicate any recent Medical Services you may have received from other than [facility] providers in the past year (date may be approximate).     Assessment:   This is a routine wellness examination for Ame.  Hearing/Vision screen Hearing Screening - Comments:: No hearing concerns. Patient does not wear hearing aids.  Vision Screening - Comments:: Some vision concerns. Patient last eye exam October 2023.  Dietary issues and exercise activities discussed: Current Exercise Habits: The patient does not participate in regular exercise at present, Exercise limited by: Other - see comments (unsteady gait)   Goals Addressed             This Visit's Progress    Walking   On track    Pt would like to walk with someone 10 days out of the month       Depression Screen    09/03/2022   11:00 AM 08/28/2021   11:27 AM 04/23/2021    1:10 PM 10/05/2020    1:09 PM 08/25/2020   10:16 AM 06/05/2020   11:13 AM 02/03/2020   11:07 AM  PHQ 2/9 Scores  PHQ - 2 Score 0 0 0  0 0 0 0    Fall Risk    09/03/2022   11:00 AM 05/24/2022    2:44 PM 03/18/2022    3:50 PM 02/05/2022    1:46 PM 11/01/2021    2:29 PM  [place] in the past year? 0 0 0  0 0  Number falls in past yr: 0 0 0 0 0  Injury with Fall? 0 0 0 0 0  Risk for fall due to : No Fall Risks No Fall Risks No Fall Risks No Fall Risks No Fall Risks  Follow up Falls evaluation completed Falls evaluation completed Falls evaluation completed Falls evaluation completed Falls evaluation completed    [place]:  Any stairs in or around the home? Yes  If so, are there any without handrails? No  Home free of loose throw rugs in walkways, pet beds, electrical cords, etc? No  Adequate lighting in your home to reduce risk of falls? Yes   ASSISTIVE DEVICES UTILIZED TO PREVENT FALLS:  Life alert? Yes  Use of a cane, walker or w/c? Yes  Grab bars in the bathroom? Yes  Shower chair or bench in shower? Yes  Elevated toilet seat or a handicapped toilet? No   TIMED UP AND GO:  Was the test performed? No .  Length of time to ambulate 10 feet: N/A sec.   Gait unsteady without use of assistive device, provider informed and interventions were implemented  Cognitive Function:    07/31/2018   11:29 AM 03/24/2017   10:22 AM 01/10/2015   10:53 AM 06/11/2013   10:15 AM  MMSE - Mini Mental State Exam  Orientation to time '3 4 4 5  '$ Orientation to Place '5 5 5 5  '$ Registration '3 3 3 3  '$ Attention/ Calculation '5 4 4 5  '$ Recall '2 1 2 2  '$ Language- name 2 objects '2 2 2 2  '$ Language- repeat '1 1 1 1  '$ Language- follow 3 step command '3 2 3 3  '$ Language- read & follow direction '1 1 1 1  '$ Write a sentence '1 1 1 1  '$ Copy design '1 1 1 1  '$ Total score '27 25 27 29        '$ 09/03/2022   11:01 AM 08/28/2021   11:31 AM 08/25/2020   10:17 AM 08/23/2019   11:03 AM  6CIT Screen  What Year? 0 points 0 points 0 points 0 points  What month? 0 points 0 points 0 points 0 points  What time? 3 points 0 points 0 points 0 points  Count back from 20 4 points 0 points 0 points 0 points  Months in reverse 4 points 4 points 4 points 0 points  Repeat phrase 10 points 10 points 8  points 0 points  Total Score 21 points 14 points 12 points 0 points    Immunizations Immunization History  Administered Date(s) Administered   Fluad Quad(high Dose 65+) 06/04/2019, 07/21/2020   Influenza, High Dose Seasonal PF 10/19/2013, 08/02/2014, 08/11/2015, 11/22/2016, 07/16/2017, 11/21/2017, 07/20/2018, 11/19/2018, 11/18/2019, 11/17/2020, 11/28/2021, 07/23/2022   Influenza,inj,Quad PF,6+ Mos 06/11/2013, 06/30/2014, 07/17/2015, 06/27/2016   Influenza-Unspecified 07/07/2012   PFIZER(Purple Top)SARS-COV-2 Vaccination 11/20/2019, 12/13/2019, 09/20/2020, 11/17/2020, 07/18/2021   Pfizer Covid-19 Vaccine Bivalent Booster 9yr & up 07/18/2021   Pneumococcal Conjugate-13 11/07/2014   Pneumococcal Polysaccharide-23 10/08/2007, 08/11/2015, 11/22/2016, 11/21/2017, 11/19/2018, 11/18/2019, 11/17/2020, 11/28/2021   Unspecified SARS-COV-2 Vaccination 07/23/2022   Zoster Recombinat (Shingrix) 07/31/2017, 12/09/2017    TDAP status: Up to  date  Flu Vaccine status: Up to date  Pneumococcal vaccine status: Up to date  Covid-19 vaccine status: Completed vaccines  Qualifies for Shingles Vaccine? Yes   Zostavax completed Yes   Shingrix Completed?: Yes  Screening Tests Health Maintenance  Topic Date Due   COVID-19 Vaccine (8 - 2023-24 season) 09/17/2022   Medicare Annual Wellness (AWV)  09/04/2023   Pneumonia Vaccine 58+ Years old  Completed   INFLUENZA VACCINE  Completed   DEXA SCAN  Completed   Zoster Vaccines- Shingrix  Completed   HPV VACCINES  Aged Out    Health Maintenance  There are no preventive care reminders to display for this patient.   Colorectal cancer screening: No longer required.   Mammogram status: No longer required due to advance age .  Bone Density status: Completed 09/04/2015. Results reflect: Bone density results: OSTEOPOROSIS. Repeat every 2  years.  Lung Cancer Screening: (Low Dose CT Chest recommended if Age 74-80 years, 30 pack-year currently smoking OR  have quit w/in 15years.) does not qualify.   Lung Cancer Screening Referral: No   Additional Screening:  Hepatitis C Screening: does not qualify; Completed No   Vision Screening: Recommended annual ophthalmology exams for early detection of glaucoma and other disorders of the eye. Is the patient up to date with their annual eye exam?  Yes  Who is the provider or what is the name of the office in which the patient attends annual eye exams? Dr.Geoffrey Kunzman   If pt is not established with a provider, would they like to be referred to a provider to establish care? No .   Dental Screening: Recommended annual dental exams for proper oral hygiene  Community Resource Referral / Chronic Care Management: CRR required this visit?  No   CCM required this visit?  No      Plan:     I have personally reviewed and noted the following in the patient's chart:   Medical and social history Use of alcohol, tobacco or illicit drugs  Current medications and supplements including opioid prescriptions. Patient is not currently taking opioid prescriptions. Functional ability and status Nutritional status Physical activity Advanced directives List of other physicians Hospitalizations, surgeries, and ER visits in previous 12 months Vitals Screenings to include cognitive, depression, and falls Referrals and appointments  In addition, I have reviewed and discussed with patient certain preventive protocols, quality metrics, and best practice recommendations. A written personalized care plan for preventive services as well as general preventive health recommendations were provided to patient.     Sandrea Hughs, NP   09/03/2022   Nurse Notes: Up to date.Son states patient now requires transfer with assist from wheelchair to the bed which seems to be high but patient declines hospital bed.son will notify provider if patient changes her mind then will order hospital bed for her. Yes

## 2022-09-03 NOTE — Patient Instructions (Signed)
[name] [name] , Thank you for taking time to come for your Medicare Wellness Visit. I appreciate your ongoing commitment to your health goals. Please review the following plan we discussed and let me know if I can assist you in the future.   Screening recommendations/referrals: Colonoscopy N/A  Mammogram N/A Bone Density : Up to date  Recommended yearly ophthalmology/optometry visit for glaucoma screening and checkup Recommended yearly dental visit for hygiene and checkup  Vaccinations: Influenza vaccine- due annually in September/October Pneumococcal vaccine : Up to date  Tdap vaccine : Up to date  Shingles vaccine : Up to date     Advanced directives: yes   Conditions/risks identified: advanced age (>24mn, >>62women);hypertension  Next appointment: 1 year    Preventive Care 655Years and Older, Female Preventive care refers to lifestyle choices and visits with your health care provider that can promote health and wellness. What does preventive care include? A yearly physical exam. This is also called an annual well check. Dental exams once or twice a year. Routine eye exams. Ask your health care provider how often you should have your eyes checked. Personal lifestyle choices, including: Daily care of your teeth and gums. Regular physical activity. Eating a healthy diet. Avoiding tobacco and drug use. Limiting alcohol use. Practicing safe sex. Taking low-dose aspirin every day. Taking vitamin and mineral supplements as recommended by your health care provider. What happens during an annual well check? The services and screenings done by your health care provider during your annual well check will depend on your age, overall health, lifestyle risk factors, and family history of disease. Counseling  Your health care provider may ask you questions about your: Alcohol use. Tobacco use. Drug use. Emotional well-being. Home and relationship well-being. Sexual activity. Eating  habits. History of falls. Memory and ability to understand (cognition). Work and work eStatistician Reproductive health. Screening  You may have the following tests or measurements: Height, weight, and BMI. Blood pressure. Lipid and cholesterol levels. These may be checked every 5 years, or more frequently if you are over 560years old. Skin check. Lung cancer screening. You may have this screening every year starting at age 8765if you have a 30-pack-year history of smoking and currently smoke or have quit within the past 15 years. Fecal occult blood test (FOBT) of the stool. You may have this test every year starting at age 86 Flexible sigmoidoscopy or colonoscopy. You may have a sigmoidoscopy every 5 years or a colonoscopy every 10 years starting at age 86 Hepatitis C blood test. Hepatitis B blood test. Sexually transmitted disease (STD) testing. Diabetes screening. This is done by checking your blood sugar (glucose) after you have not eaten for a while (fasting). You may have this done every 1-3 years. Bone density scan. This is done to screen for osteoporosis. You may have this done starting at age 86 Mammogram. This may be done every 1-2 years. Talk to your health care provider about how often you should have regular mammograms. Talk with your health care provider about your test results, treatment options, and if necessary, the need for more tests. Vaccines  Your health care provider may recommend certain vaccines, such as: Influenza vaccine. This is recommended every year. Tetanus, diphtheria, and acellular pertussis (Tdap, Td) vaccine. You may need a Td booster every 10 years. Zoster vaccine. You may need this after age 86 Pneumococcal 13-valent conjugate (PCV13) vaccine. One dose is recommended after age 86 Pneumococcal polysaccharide (PPSV23) vaccine. One dose is  recommended after age 23. Talk to your health care provider about which screenings and vaccines you need and how  often you need them. This information is not intended to replace advice given to you by your health care provider. Make sure you discuss any questions you have with your health care provider. Document Released: 10/20/2015 Document Revised: 06/12/2016 Document Reviewed: 07/25/2015 Elsevier Interactive Patient Education  2017 [place] Prevention in the Home Falls can cause injuries. They can happen to people of all ages. There are many things you can do to make your home safe and to help prevent falls. What can I do on the outside of my home? Regularly fix the edges of walkways and driveways and fix any cracks. Remove anything that might make you trip as you walk through a door, such as a raised step or threshold. Trim any bushes or trees on the path to your home. Use bright outdoor lighting. Clear any walking paths of anything that might make someone trip, such as rocks or tools. Regularly check to see if handrails are loose or broken. Make sure that both sides of any steps have handrails. Any raised decks and porches should have guardrails on the edges. Have any leaves, snow, or ice cleared regularly. Use sand or salt on walking paths during winter. Clean up any spills in your garage right away. This includes oil or grease spills. What can I do in the bathroom? Use night lights. Install grab bars by the toilet and in the tub and shower. Do not use towel bars as grab bars. Use non-skid mats or decals in the tub or shower. If you need to sit down in the shower, use a plastic, non-slip stool. Keep the floor dry. Clean up any water that spills on the floor as soon as it happens. Remove soap buildup in the tub or shower regularly. Attach bath mats securely with double-sided non-slip rug tape. Do not have throw rugs and other things on the floor that can make you trip. What can I do in the bedroom? Use night lights. Make sure that you have a light by your bed that is easy to  reach. Do not use any sheets or blankets that are too big for your bed. They should not hang down onto the floor. Have a firm chair that has side arms. You can use this for support while you get dressed. Do not have throw rugs and other things on the floor that can make you trip. What can I do in the kitchen? Clean up any spills right away. Avoid walking on wet floors. Keep items that you use a lot in easy-to-reach places. If you need to reach something above you, use a strong step stool that has a grab bar. Keep electrical cords out of the way. Do not use floor polish or wax that makes floors slippery. If you must use wax, use non-skid floor wax. Do not have throw rugs and other things on the floor that can make you trip. What can I do with my stairs? Do not leave any items on the stairs. Make sure that there are handrails on both sides of the stairs and use them. Fix handrails that are broken or loose. Make sure that handrails are as long as the stairways. Check any carpeting to make sure that it is firmly attached to the stairs. Fix any carpet that is loose or worn. Avoid having throw rugs at the top or bottom of the stairs. If  you do have throw rugs, attach them to the floor with carpet tape. Make sure that you have a light switch at the top of the stairs and the bottom of the stairs. If you do not have them, ask someone to add them for you. What else can I do to help prevent falls? Wear shoes that: Do not have high heels. Have rubber bottoms. Are comfortable and fit you well. Are closed at the toe. Do not wear sandals. If you use a stepladder: Make sure that it is fully opened. Do not climb a closed stepladder. Make sure that both sides of the stepladder are locked into place. Ask someone to hold it for you, if possible. Clearly mark and make sure that you can see: Any grab bars or handrails. First and last steps. Where the edge of each step is. Use tools that help you move  around (mobility aids) if they are needed. These include: Canes. Walkers. Scooters. Crutches. Turn on the lights when you go into a dark area. Replace any light bulbs as soon as they burn out. Set up your furniture so you have a clear path. Avoid moving your furniture around. If any of your floors are uneven, fix them. If there are any pets around you, be aware of where they are. Review your medicines with your doctor. Some medicines can make you feel dizzy. This can increase your chance of falling. Ask your doctor what other things that you can do to help prevent falls. This information is not intended to replace advice given to you by your health care provider. Make sure you discuss any questions you have with your health care provider. Document Released: 07/20/2009 Document Revised: 02/29/2016 Document Reviewed: 10/28/2014 Elsevier Interactive Patient Education  2017 [employer].

## 2022-09-11 ENCOUNTER — Other Ambulatory Visit: Payer: Self-pay

## 2022-09-11 ENCOUNTER — Emergency Department (HOSPITAL_COMMUNITY): Payer: Medicare Other

## 2022-09-11 ENCOUNTER — Inpatient Hospital Stay (HOSPITAL_COMMUNITY)
Admission: EM | Admit: 2022-09-11 | Discharge: 2022-09-17 | DRG: 065 | Disposition: A | Discharge status: Skilled Nursing Facility | Payer: Medicare Other | Attending: Internal Medicine | Admitting: Internal Medicine

## 2022-09-11 DIAGNOSIS — R29701 NIHSS score 1: Secondary | ICD-10-CM | POA: Diagnosis not present

## 2022-09-11 DIAGNOSIS — F419 Anxiety disorder, unspecified: Secondary | ICD-10-CM | POA: Diagnosis present

## 2022-09-11 DIAGNOSIS — G9341 Metabolic encephalopathy: Secondary | ICD-10-CM | POA: Diagnosis not present

## 2022-09-11 DIAGNOSIS — I739 Peripheral vascular disease, unspecified: Secondary | ICD-10-CM | POA: Diagnosis present

## 2022-09-11 DIAGNOSIS — R29700 NIHSS score 0: Secondary | ICD-10-CM | POA: Diagnosis present

## 2022-09-11 DIAGNOSIS — J302 Other seasonal allergic rhinitis: Secondary | ICD-10-CM | POA: Diagnosis not present

## 2022-09-11 DIAGNOSIS — Z7902 Long term (current) use of antithrombotics/antiplatelets: Secondary | ICD-10-CM

## 2022-09-11 DIAGNOSIS — G459 Transient cerebral ischemic attack, unspecified: Secondary | ICD-10-CM | POA: Diagnosis not present

## 2022-09-11 DIAGNOSIS — Z8249 Family history of ischemic heart disease and other diseases of the circulatory system: Secondary | ICD-10-CM | POA: Diagnosis not present

## 2022-09-11 DIAGNOSIS — Z66 Do not resuscitate: Secondary | ICD-10-CM | POA: Diagnosis present

## 2022-09-11 DIAGNOSIS — I517 Cardiomegaly: Secondary | ICD-10-CM | POA: Diagnosis not present

## 2022-09-11 DIAGNOSIS — R29704 NIHSS score 4: Secondary | ICD-10-CM | POA: Diagnosis not present

## 2022-09-11 DIAGNOSIS — Z885 Allergy status to narcotic agent status: Secondary | ICD-10-CM

## 2022-09-11 DIAGNOSIS — H540X33 Blindness right eye category 3, blindness left eye category 3: Secondary | ICD-10-CM | POA: Diagnosis not present

## 2022-09-11 DIAGNOSIS — I10 Essential (primary) hypertension: Secondary | ICD-10-CM | POA: Diagnosis present

## 2022-09-11 DIAGNOSIS — J45998 Other asthma: Secondary | ICD-10-CM | POA: Diagnosis not present

## 2022-09-11 DIAGNOSIS — G5793 Unspecified mononeuropathy of bilateral lower limbs: Secondary | ICD-10-CM | POA: Diagnosis not present

## 2022-09-11 DIAGNOSIS — Z90710 Acquired absence of both cervix and uterus: Secondary | ICD-10-CM | POA: Diagnosis not present

## 2022-09-11 DIAGNOSIS — K219 Gastro-esophageal reflux disease without esophagitis: Secondary | ICD-10-CM | POA: Diagnosis present

## 2022-09-11 DIAGNOSIS — I6523 Occlusion and stenosis of bilateral carotid arteries: Secondary | ICD-10-CM | POA: Diagnosis present

## 2022-09-11 DIAGNOSIS — E46 Unspecified protein-calorie malnutrition: Secondary | ICD-10-CM | POA: Diagnosis not present

## 2022-09-11 DIAGNOSIS — J45909 Unspecified asthma, uncomplicated: Secondary | ICD-10-CM | POA: Diagnosis present

## 2022-09-11 DIAGNOSIS — E8809 Other disorders of plasma-protein metabolism, not elsewhere classified: Secondary | ICD-10-CM | POA: Diagnosis not present

## 2022-09-11 DIAGNOSIS — M1909 Primary osteoarthritis, other specified site: Secondary | ICD-10-CM | POA: Diagnosis not present

## 2022-09-11 DIAGNOSIS — H40213 Acute angle-closure glaucoma, bilateral: Secondary | ICD-10-CM | POA: Diagnosis present

## 2022-09-11 DIAGNOSIS — Z7401 Bed confinement status: Secondary | ICD-10-CM | POA: Diagnosis not present

## 2022-09-11 DIAGNOSIS — Z79899 Other long term (current) drug therapy: Secondary | ICD-10-CM

## 2022-09-11 DIAGNOSIS — Z7951 Long term (current) use of inhaled steroids: Secondary | ICD-10-CM

## 2022-09-11 DIAGNOSIS — R531 Weakness: Secondary | ICD-10-CM | POA: Diagnosis not present

## 2022-09-11 DIAGNOSIS — Z882 Allergy status to sulfonamides status: Secondary | ICD-10-CM

## 2022-09-11 DIAGNOSIS — E785 Hyperlipidemia, unspecified: Secondary | ICD-10-CM | POA: Diagnosis present

## 2022-09-11 DIAGNOSIS — F05 Delirium due to known physiological condition: Secondary | ICD-10-CM | POA: Diagnosis not present

## 2022-09-11 DIAGNOSIS — I639 Cerebral infarction, unspecified: Secondary | ICD-10-CM | POA: Diagnosis not present

## 2022-09-11 DIAGNOSIS — H409 Unspecified glaucoma: Secondary | ICD-10-CM | POA: Diagnosis not present

## 2022-09-11 DIAGNOSIS — I4891 Unspecified atrial fibrillation: Secondary | ICD-10-CM | POA: Diagnosis not present

## 2022-09-11 DIAGNOSIS — Z886 Allergy status to analgesic agent status: Secondary | ICD-10-CM

## 2022-09-11 DIAGNOSIS — I480 Paroxysmal atrial fibrillation: Secondary | ICD-10-CM | POA: Diagnosis present

## 2022-09-11 DIAGNOSIS — H548 Legal blindness, as defined in USA: Secondary | ICD-10-CM | POA: Diagnosis present

## 2022-09-11 DIAGNOSIS — Z8673 Personal history of transient ischemic attack (TIA), and cerebral infarction without residual deficits: Secondary | ICD-10-CM | POA: Diagnosis not present

## 2022-09-11 DIAGNOSIS — F03911 Unspecified dementia, unspecified severity, with agitation: Secondary | ICD-10-CM | POA: Diagnosis present

## 2022-09-11 DIAGNOSIS — H401113 Primary open-angle glaucoma, right eye, severe stage: Secondary | ICD-10-CM | POA: Diagnosis not present

## 2022-09-11 DIAGNOSIS — H3530 Unspecified macular degeneration: Secondary | ICD-10-CM | POA: Diagnosis present

## 2022-09-11 DIAGNOSIS — J309 Allergic rhinitis, unspecified: Secondary | ICD-10-CM | POA: Diagnosis not present

## 2022-09-11 DIAGNOSIS — K5900 Constipation, unspecified: Secondary | ICD-10-CM | POA: Diagnosis not present

## 2022-09-11 DIAGNOSIS — Z823 Family history of stroke: Secondary | ICD-10-CM | POA: Diagnosis not present

## 2022-09-11 DIAGNOSIS — Z888 Allergy status to other drugs, medicaments and biological substances status: Secondary | ICD-10-CM

## 2022-09-11 DIAGNOSIS — I6782 Cerebral ischemia: Secondary | ICD-10-CM | POA: Diagnosis not present

## 2022-09-11 DIAGNOSIS — R4182 Altered mental status, unspecified: Secondary | ICD-10-CM | POA: Diagnosis not present

## 2022-09-11 DIAGNOSIS — M6281 Muscle weakness (generalized): Secondary | ICD-10-CM | POA: Diagnosis not present

## 2022-09-11 DIAGNOSIS — M1990 Unspecified osteoarthritis, unspecified site: Secondary | ICD-10-CM | POA: Diagnosis present

## 2022-09-11 DIAGNOSIS — I63532 Cerebral infarction due to unspecified occlusion or stenosis of left posterior cerebral artery: Secondary | ICD-10-CM | POA: Diagnosis present

## 2022-09-11 DIAGNOSIS — I69920 Aphasia following unspecified cerebrovascular disease: Secondary | ICD-10-CM | POA: Diagnosis not present

## 2022-09-11 DIAGNOSIS — R443 Hallucinations, unspecified: Secondary | ICD-10-CM | POA: Diagnosis not present

## 2022-09-11 DIAGNOSIS — I4892 Unspecified atrial flutter: Secondary | ICD-10-CM | POA: Diagnosis present

## 2022-09-11 DIAGNOSIS — K649 Unspecified hemorrhoids: Secondary | ICD-10-CM | POA: Diagnosis not present

## 2022-09-11 DIAGNOSIS — I619 Nontraumatic intracerebral hemorrhage, unspecified: Secondary | ICD-10-CM | POA: Diagnosis not present

## 2022-09-11 LAB — URINALYSIS, ROUTINE W REFLEX MICROSCOPIC
Bilirubin Urine: NEGATIVE
Glucose, UA: NEGATIVE mg/dL
Hgb urine dipstick: NEGATIVE
Ketones, ur: NEGATIVE mg/dL
Leukocytes,Ua: NEGATIVE
Nitrite: NEGATIVE
Protein, ur: NEGATIVE mg/dL
Specific Gravity, Urine: 1.01 (ref 1.005–1.030)
pH: 6 (ref 5.0–8.0)

## 2022-09-11 LAB — DIFFERENTIAL
Abs Immature Granulocytes: 0.02 10*3/uL (ref 0.00–0.07)
Basophils Absolute: 0 10*3/uL (ref 0.0–0.1)
Basophils Relative: 1 %
Eosinophils Absolute: 0.1 10*3/uL (ref 0.0–0.5)
Eosinophils Relative: 1 %
Immature Granulocytes: 0 %
Lymphocytes Relative: 32 %
Lymphs Abs: 2.1 10*3/uL (ref 0.7–4.0)
Monocytes Absolute: 0.8 10*3/uL (ref 0.1–1.0)
Monocytes Relative: 12 %
Neutro Abs: 3.5 10*3/uL (ref 1.7–7.7)
Neutrophils Relative %: 54 %

## 2022-09-11 LAB — PROTIME-INR
INR: 1.1 (ref 0.8–1.2)
Prothrombin Time: 14 seconds (ref 11.4–15.2)

## 2022-09-11 LAB — COMPREHENSIVE METABOLIC PANEL
ALT: 23 U/L (ref 0–44)
AST: 26 U/L (ref 15–41)
Albumin: 3.3 g/dL — ABNORMAL LOW (ref 3.5–5.0)
Alkaline Phosphatase: 67 U/L (ref 38–126)
Anion gap: 8 (ref 5–15)
BUN: 18 mg/dL (ref 8–23)
CO2: 24 mmol/L (ref 22–32)
Calcium: 8.9 mg/dL (ref 8.9–10.3)
Chloride: 106 mmol/L (ref 98–111)
Creatinine, Ser: 1.04 mg/dL — ABNORMAL HIGH (ref 0.44–1.00)
GFR, Estimated: 49 mL/min — ABNORMAL LOW (ref >60)
Glucose, Bld: 133 mg/dL — ABNORMAL HIGH (ref 70–99)
Potassium: 4.3 mmol/L (ref 3.5–5.1)
Sodium: 138 mmol/L (ref 135–145)
Total Bilirubin: 0.8 mg/dL (ref 0.3–1.2)
Total Protein: 6.3 g/dL — ABNORMAL LOW (ref 6.5–8.1)

## 2022-09-11 LAB — CBC
HCT: 33.6 % — ABNORMAL LOW (ref 36.0–46.0)
Hemoglobin: 11.6 g/dL — ABNORMAL LOW (ref 12.0–15.0)
MCH: 31.6 pg (ref 26.0–34.0)
MCHC: 34.5 g/dL (ref 30.0–36.0)
MCV: 91.6 fL (ref 80.0–100.0)
Platelets: 187 10*3/uL (ref 150–400)
RBC: 3.67 MIL/uL — ABNORMAL LOW (ref 3.87–5.11)
RDW: 15.2 % (ref 11.5–15.5)
WBC: 6.4 10*3/uL (ref 4.0–10.5)
nRBC: 0 % (ref 0.0–0.2)

## 2022-09-11 LAB — I-STAT CHEM 8, ED
BUN: 21 mg/dL (ref 8–23)
Calcium, Ion: 1.04 mmol/L — ABNORMAL LOW (ref 1.15–1.40)
Chloride: 105 mmol/L (ref 98–111)
Creatinine, Ser: 1.1 mg/dL — ABNORMAL HIGH (ref 0.44–1.00)
Glucose, Bld: 131 mg/dL — ABNORMAL HIGH (ref 70–99)
HCT: 36 % (ref 36.0–46.0)
Hemoglobin: 12.2 g/dL (ref 12.0–15.0)
Potassium: 4.5 mmol/L (ref 3.5–5.1)
Sodium: 136 mmol/L (ref 135–145)
TCO2: 23 mmol/L (ref 22–32)

## 2022-09-11 LAB — TSH: TSH: 2.366 u[IU]/mL (ref 0.350–4.500)

## 2022-09-11 LAB — APTT: aPTT: 23 seconds — ABNORMAL LOW (ref 24–36)

## 2022-09-11 LAB — TROPONIN I (HIGH SENSITIVITY): Troponin I (High Sensitivity): 7 ng/L (ref <18)

## 2022-09-11 LAB — ETHANOL: Alcohol, Ethyl (B): <10 mg/dL (ref <10)

## 2022-09-11 MED ORDER — NONFORMULARY OR COMPOUNDED ITEM: 11 refills | Status: AC

## 2022-09-11 MED ORDER — SODIUM CHLORIDE 0.9% FLUSH: 3.0000 mL | Freq: Once | INTRAVENOUS | Status: DC

## 2022-09-11 NOTE — Consult Note (Addendum)
NEUROLOGY CONSULTATION NOTE   Date of service: September 11, 2022 Patient Name: [name] [name] MRN:  [id number] DOB:  [birth year] Reason for consult: "BL lower ext weakness and leaning to the right" Requesting Provider: Drenda Freeze, MD _ _ _   _ __   _ __ _ _  __ __   _ __   __ _  History of Present Illness  [name] [name] is a 88 y.o. female with PMH significant for glaucoma and BL ARMD with resultant legal blindness, peripheral vascular disese, GERD who presents with acute onset poor responsiveness.  Per patient's son, patient was fine earlier in the day and they sat down to eat dinner.  After they were done eating dinner, he noticed that patient was sleepy and not cooperative and hard to get her to pay attention.  She was slumped over to the right side in the chair and they had a hard time waking her up.  They were finally able to get her up to the bedroom but noted that she would not stand up and not use her legs.  Patient reported that her legs were feeling little weak and then gradually started sounding more like herself.  Son reported the patient had a similar episode 6 months ago probably.  Denies any new medication, patient has been eating and drinking fine over the last few days, no shaking and no stiffness concerning for seizure.  Patient does not have a prior history of seizures.  Patient does have a history of mini stroke with no residual symptoms.  Per EMS, patient had dinner at 1915 and was fine. Son was trying to talk to her around 2000 and she was not responding. She seemed to be leaning on her right and would not lift her legs up when asked. EMS called and patient was pretty much non focal with no deficit. She was brought in as a code stroke. I arrived that the bridge at 2159 just prior to patient showing up and met her at the ED bay/bridge. On my arrival, patient is awake, alert, talkative. She reports chronic blindness but no other focal deficit.  LKW: 09/11/22 at  1915. mRS: 4 tNKASE: not offered, no deficit on arrival and evaluation. Thrombectomy: not offered, no deficit on arrival and evaluation. NIHSS components Score: Comment  1a Level of Conscious 0_0  1_1  2_2  3_3      1b LOC Questions 0_4  1_5  2_6       1c LOC Commands 0_7  1_8  2_9       2 Best Gaze 0_10  1_11  2_12       3 Visual 0_13  1_14  2_15  3_16    Chronic blindness  4 Facial Palsy 0_17  1_18  2_19  3_20      5a Motor Arm - left 0_21  1_22  2_23  3_24  4_25  UN_26    5b Motor Arm - Right 0_27  1_28  2_29  3_30  4_31  UN_32    6a Motor Leg - Left 0_33  1_34  2_35  3_36  4_37  UN_38    6b Motor Leg - Right 0_39  1_40  2_41  3_42  4_43  UN_44    7 Limb Ataxia 0_45  1_46  2_47  3_48  UN_49     8 Sensory 0_50  1_51  2_52  UN_53      9 Best Language 0_54  1_55  2_56  3_57      10 Dysarthria 0_58  1_59  2_60  UN_61      11 Extinct. and Inattention 0_62  1_63  2_64       TOTAL: 3       ROS   Constitutional Denies weight loss, fever and chills.   HEENT Denies changes in vision and hearing.  Respiratory Denies SOB and cough.   CV Denies palpitations and CP   GI Denies abdominal pain, nausea, vomiting and diarrhea.   GU Denies dysuria and urinary frequency.   MSK Denies myalgia and joint pain.   Skin Denies rash and pruritus.   Neurological Denies headache and syncope.   Psychiatric Denies recent changes in mood. Denies anxiety and depression.    Past History   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   Edema extremities    GERD (gastroesophageal reflux disease)    Glaucoma    Macular degeneration    Per patient    Osteoarthritis    PVD (peripheral vascular disease) ([place])    Seasonal allergies    Stroke [facility])    Past Surgical History:  Procedure Laterality Date   ABDOMINAL HYSTERECTOMY  1981   TONSILLECTOMY  1953   Family History  Problem Relation Age of Onset   Stroke Father    Hypertension Father    Heart disease Mother    Hypertension Mother    Diabetes Mother     Social History   Socioeconomic History   Marital status: Widowed    Spouse name: Not on file   Number of children: Not on file   Years of education: Not on file   Highest education level: Not on file  Occupational History   Not on file  Tobacco Use   Smoking status: Never   Smokeless tobacco: Never  Vaping Use   Vaping Use: Never used  Substance and Sexual Activity   Alcohol use: No    Alcohol/week: 0.0 standard drinks of alcohol   Drug use: No   Sexual activity: Not Currently  Other Topics Concern   Not on file  Social History Narrative   Was cosmetologist, 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, does not have pets, has living will   Social Determinants of Health   Financial Resource Strain: [place]  (07/31/2018)   Overall Financial Resource Strain (CARDIA)    Difficulty of Paying Living Expenses: Not hard at all  Food Insecurity: No Food Insecurity (07/31/2018)   Hunger Vital Sign    Worried About Running Out of Food in the Last Year: Never true    Alger in the Last Year: Never true  Transportation Needs: No Transportation Needs (07/31/2018)   PRAPARE - Hydrologist (Medical): No    Lack of Transportation (Non-Medical): No  Physical Activity: Insufficiently Active (07/31/2018)   Exercise Vital Sign    Days of Exercise per Week: 3 days    Minutes of Exercise per Session: 30 min  Stress: No Stress Concern Present (07/31/2018)   [place]    Feeling of Stress : Not at all  Social Connections: Somewhat Isolated (07/31/2018)   Social Connection and Isolation Panel [NHANES]    Frequency of Communication with Friends and Family: More than three times a week    Frequency of Social Gatherings with Friends and Family: More than three times a week    Attends Religious Services: More than 4 times per year    Active Member of [employer] or Organizations: No    Attends Theatre manager Meetings: Never    Marital Status: Widowed   Allergies  Allergen Reactions   Aspirin Other (See Comments)    sweats  Codeine Other (See Comments)   Iodine Other (See Comments)    Pt is not aware of this allergy, does not recall much info.   Prednisone Other (See Comments)    sweats   Lyrica [Pregabalin]     Wobb[place], "more than sleepy"   Benzonatate Itching and Rash   Sulfa Antibiotics Other (See Comments)    Doesn't remember Other reaction(s): Unknown    Medications  (Not in a hospital admission)    Vitals   There were no vitals filed for this visit.   There is no height or weight on file to calculate BMI.  Physical Exam   General: Laying comfortably in bed; in no acute distress.  HENT: Normal oropharynx and mucosa. Normal external appearance of ears and nose.  Neck: Supple, no pain or tenderness  CV: No JVD. No peripheral edema.  Pulmonary: Symmetric Chest rise. Normal respiratory effort.  Abdomen: Soft to touch, non-tender.  Ext: No cyanosis, edema, or deformity  Skin: No rash. Normal palpation of skin.   Musculoskeletal: Normal digits and nails by inspection. No clubbing.   Neurologic Examination  Mental status/Cognition: Alert, oriented to self, place, month and year, good attention. Speech/language: Fluent, comprehension intact, object naming intact, repetition intact.  Cranial nerves:   CN II Pupils equal and reactive to light, blind due to glaucoma and age-related macular degeneration bilaterally   CN III,IV,VI EOM intact, no gaze preference or deviation, no nystagmus    CN V normal sensation in V1, V2, and V3 segments bilaterally    CN VII no asymmetry, no nasolabial fold flattening    CN VIII normal hearing to speech    CN IX & X normal palatal elevation, no uvular deviation    CN XI 5/5 head turn and 5/5 shoulder shrug bilaterally    CN XII midline tongue protrusion    Motor:  Muscle bulk: normal, tone normal, pronator drift none tremor  none Mvmt Root Nerve  Muscle Right Left Comments  SA C5/6 Ax Deltoid 5 5   EF C5/6 Mc Biceps 5 5   EE C6/7/8 Rad Triceps 5 5   WF C6/7 Med FCR     WE C7/8 PIN ECU     F Ab C8/T1 U ADM/FDI 5 5   HF L1/2/3 Fem Illopsoas 5 5   KE L2/3/4 Fem Quad 5 5   DF L4/5 D Peron Tib Ant 5 5   PF S1/2 Tibial Grc/Sol 5 5    Sensation:  Light touch Intact throughout   Pin prick    Temperature    Vibration   Proprioception    Coordination/Complex Motor:  - Finger to Nose intact bilaterally - Heel to shin intact bilaterally - Rapid alternating movement are slowed - Gait: Deferred for patient safety. Labs   CBC: No results for input(s): "WBC", "NEUTROABS", "HGB", "HCT", "MCV", "PLT" in the last 168 hours.  Basic Metabolic Panel:  Lab Results  Component Value Date   NA 142 03/18/2022   K 4.2 03/18/2022   CO2 25 03/18/2022   GLUCOSE 98 03/18/2022   BUN 10 03/18/2022   CREATININE 0.74 03/18/2022   CALCIUM 9.6 03/18/2022   GFRNONAA >60 03/12/2022   GFRAA 70 02/09/2021   Lipid Panel:  Lab Results  Component Value Date   LDLCALC 56 11/13/2021   HgbA1c:  Lab Results  Component Value Date   HGBA1C 5.6 04/22/2016   Urine Drug Screen: No results found for: "LABOPIA", "COCAINSCRNUR", "LABBENZ", "AMPHETMU", "THCU", "LABBARB"  Alcohol Level No  results found for: "ETH"  CT Head without contrast: Pending  MRI Brain: pending  Impression   [name] [name] is a 88 y.o. female with PMH significant for glaucoma and BL ARMD with resultant legal blindness, peripheral vascular disese, GERD who presents with acute onset poor responsiveness and then gradually improved. She was back to her baseline shortly. No shaking concerning for seizure.   Etiology unclear and differential broad. Could have been orthostatic, hypoglycemia, hypotensive, toxic metabolic. Unlikely for her to have a first time seizure at this age, possibly TIA but would expect more of a focality to her deficit if truly was a  TIA.  She was brought in as a code stroke by EMS and not clear why. EMS did not notice any focal deficit enroute and she does not have any deficit on arrival when I examined her. She was not a candidate for any intervention as she had no deficit and NIHSS was 0 and thus no need to proceed down the stroke code pathway.  Recommendations  - recommend CBC, chemistry, orthostatic vitals x 1. - Vit B12, TSH, cortisol, folate, ammonia. - MRI Brain without contrast. - no further workup if above negative. ______________________________________________________________________   Thank you for the opportunity to take part in the care of this patient. If you have any further questions, please contact the neurology consultation attending.  Signed,  [place] Pager Number [phone] _ _ _   _ __   _ __ _ _  __ __   _ __   __ _

## 2022-09-11 NOTE — Telephone Encounter (Signed)
Please review request for compound cream. RX is set to print as it can not be e-prescribed. Once rx is printed and signed, I will give to the admin staff to fax to [facility] @ [phone]

## 2022-09-11 NOTE — ED Triage Notes (Signed)
Pt arrives to ED via EMS from home c/o weakness after eating dinner. EMS reports unresponiveness and lethargy per family. LKN 1950. Pt pos for thinners. Pt w/ hx of TIA pt with CBG of 150

## 2022-09-11 NOTE — ED Provider Notes (Signed)
[facility] EMERGENCY DEPARTMENT Provider Note   CSN: [id number] Arrival date & time: 09/11/22  2201     History  Chief Complaint  Patient presents with   Weakness    [name] [name] is a 88 y.o. female history of dementia, previous stroke, here presenting with altered mental status.  Patient's last normal was around 7:15 PM.  She finished eating dinner and son called her around 8 PM and she was not responding.  She he also noticed that she seemed to be slumped over and also had difficulty lifting of her legs.  Patient is blind at baseline from her acute angle-closure glaucoma.  Patient has no neurodeficits per EMS and NIH of 0.  Code stroke was activated by EMS and was canceled by neurology.  The history is provided by the patient and a relative.       Home Medications Prior to Admission medications   Medication Sig Start Date End Date Taking? Authorizing Provider  acetaminophen (TYLENOL) 500 MG tablet Take 500 mg by mouth every 6 (six) hours as needed for mild pain or moderate pain.     [provider]  albuterol (PROVENTIL HFA;VENTOLIN HFA) 108 (90 Base) MCG/ACT inhaler Inhale 2 puffs into the lungs every 6 (six) hours as needed for wheezing. 11/27/17   Mariea Clonts, Tiffany L, DO  alum hydroxide-mag trisilicate (GAVISCON) 40-98 MG CHEW chewable tablet Chew 2 tablets by mouth 2 (two) times daily.    [provider]  amLODipine (NORVASC) 5 MG tablet TAKE 1 TABLET (5 MG TOTAL) BY MOUTH DAILY. . 05/27/22   Sueanne Margarita, MD  atorvastatin (LIPITOR) 10 MG tablet TAKE 1 TABLET BY MOUTH EVERY DAY 04/08/22   Ngetich, Dinah C, NP  atropine 1 % ophthalmic solution Place 1 drop into the right eye daily.  06/30/19   [provider]  Brinzolamide-Brimonidine 1-0.2 % SUSP Apply 1 drop to eye 3 (three) times daily.    [provider]  budesonide-formoterol (SYMBICORT) 80-4.5 MCG/ACT inhaler Inhale 2 puffs into the lungs 2 (two) times daily.    [provider]  clopidogrel (PLAVIX) 75 MG tablet TAKE 1 TABLET BY MOUTH EVERY DAY 02/15/22   Ngetich, Dinah C, NP  Coenzyme Q10 (COQ10) 50 MG CAPS Take 50 mg by mouth daily.    [provider]  docusate sodium (COLACE) 100 MG capsule TAKE 1 CAPSULE BY MOUTH TWICE A DAY 10/05/20   Reed, Tiffany L, DO  Emollient (CETAPHIL) cream Apply topically 2 (two) times daily. To legs 02/05/22   Ngetich, Dinah C, NP  fluticasone (FLONASE) 50 MCG/ACT nasal spray Place 1 spray into the nose daily.    [provider]  hydrocortisone (ANUSOL-HC) 2.5 % rectal cream Place 1 application rectally daily as needed for hemorrhoids or anal itching. 02/03/20   Reed, Tiffany L, DO  loratadine (CLARITIN) 10 MG tablet Take 10 mg by mouth daily. 08/16/21   [provider]  Multiple Vitamins-Minerals (PRESERVISION AREDS 2) CAPS Take 1 capsule by mouth 2 (two) times daily.    [provider]  Multiple Vitamins-Minerals (SENIOR MULTIVITAMIN PLUS) TABS Take 1 tablet by mouth daily.     [provider]  NONFORMULARY OR COMPOUNDED ITEM [facility]:  Peripheral Neuropathy Cream - Bupivacaine 1%, Doxepin 3%, Gabapentin 6%, Pentoxifylline 3%, Topiramate 1%, apply 1-2 grams to affected areas 3-4 times daily prn. 09/11/22   Ngetich, Dinah C, NP  Omega-3 Fatty Acids (FISH OIL) 1200 MG CPDR Take 1,200 mg by  mouth daily. Take one tablet by mouth once daily    [provider]  prednisoLONE acetate (PRED FORTE) 1 % ophthalmic suspension Place 1 drop into the right eye daily. 06/13/20   [provider]  risperiDONE (RISPERDAL) 0.5 MG tablet TAKE 1 TABLET BY MOUTH TWICE A DAY 07/22/22   Eubanks, Carlos American, NP  ROCKLATAN 0.02-0.005 % SOLN Place 1 drop into the left eye daily. 04/23/21   [provider]  sodium chloride (MURO 128) 5 % ophthalmic solution Place 1 drop into the left eye every 4 (four) hours as needed for eye irritation.    [provider]  vitamin C  (ASCORBIC ACID) 500 MG tablet Take 500 mg by mouth daily.    [provider]      Allergies    Aspirin, Codeine, Iodine, Prednisone, Lyrica [pregabalin], Benzonatate, and Sulfa antibiotics    Review of Systems   Review of Systems  Neurological:  Positive for weakness.  All other systems reviewed and are negative.   Physical Exam Updated Vital Signs BP (!) 145/69 (BP Location: Right Arm)   Pulse 73   Temp 98.3 F (36.8 C) (Rectal)   Resp 16   Ht '5\' 5"'$  (1.651 m)   Wt 63.5 kg   SpO2 100%   BMI 23.30 kg/m  Physical Exam Vitals and nursing note reviewed.  Constitutional:      Comments: Chronically ill  HENT:     Head: Normocephalic.     Nose: Nose normal.     Mouth/Throat:     Mouth: Mucous membranes are moist.  Eyes:     Extraocular Movements: Extraocular movements intact.     Pupils: Pupils are equal, round, and reactive to light.  Cardiovascular:     Rate and Rhythm: Normal rate and regular rhythm.     Pulses: Normal pulses.     Heart sounds: Normal heart sounds.  Pulmonary:     Effort: Pulmonary effort is normal.     Breath sounds: Normal breath sounds.  Abdominal:     General: Abdomen is flat.     Palpations: Abdomen is soft.  Musculoskeletal:     Cervical back: Normal range of motion and neck supple.     Comments: Rectal-no obvious skin breakdown or sacral decub ulcer  Skin:    General: Skin is warm.     Capillary Refill: Capillary refill takes less than 2 seconds.  Neurological:     Comments: Patient has no obvious facial droop.  Patient has normal sensation and 4/5 motor strength bilateral arms and legs.  Psychiatric:        Mood and Affect: Mood normal.     ED Results / Procedures / Treatments   Labs (all labs ordered are listed, but only abnormal results are displayed) Labs Reviewed  APTT - Abnormal; Notable for the following components:      Result Value   aPTT 23 (*)    All other components within normal limits  CBC - Abnormal;  Notable for the following components:   RBC 3.67 (*)    Hemoglobin 11.6 (*)    HCT 33.6 (*)    All other components within normal limits  I-STAT CHEM 8, ED - Abnormal; Notable for the following components:   Creatinine, Ser 1.10 (*)    Glucose, Bld 131 (*)    Calcium, Ion 1.04 (*)    All other components within normal limits  URINE CULTURE  PROTIME-INR  DIFFERENTIAL  COMPREHENSIVE METABOLIC PANEL  ETHANOL  URINALYSIS, ROUTINE W REFLEX MICROSCOPIC  AMMONIA  TSH  VITAMIN B12  FOLATE  CBG MONITORING, ED  TROPONIN I (HIGH SENSITIVITY)    EKG EKG Interpretation  Date/Time:  Wednesday September 11 2022 22:31:54 EST Ventricular Rate:  61 PR Interval:  248 QRS Duration: 144 QT Interval:  443 QTC Calculation: 447 R Axis:   67 Text Interpretation: Sinus rhythm Prolonged PR interval Right bundle branch block No significant change since last tracing Confirmed by Wandra Arthurs [phone]) on 09/11/2022 10:35:43 PM  Radiology No results found.  Procedures Procedures    Medications Ordered in ED Medications  sodium chloride flush (NS) 0.9 % injection 3 mL (has no administration in time range)    ED Course/ Medical Decision Making/ A&P                           Medical Decision Making [name] [name] is a 88 y.o. female here presenting with weakness and episode of unresponsiveness.  Unclear if she had seizures versus syncope versus TIA or small stroke versus UTI or electrolyte abnormality.  Patient is back to baseline now.  Plan to get CBC and CMP and CT hand and troponin and urinalysis.  Neurology canceled code stroke but recommend MRI brain for further workup.   11:03 PM Patient's labs were unremarkable. I discussed goals of care with the son. He states that patient is full code.   11:46 PM CT head still pending. Anticipate admission for syncope workup if CT head negative. Signed out to Dr. Dina Rich in the ED.   Amount and/or Complexity of Data Reviewed Labs: ordered.  Decision-making details documented in ED Course. Radiology: ordered and independent interpretation performed. Decision-making details documented in ED Course. ECG/medicine tests: ordered and independent interpretation performed. Decision-making details documented in ED Course.    Final Clinical Impression(s) / ED Diagnoses Final diagnoses:  None    Rx / DC Orders ED Discharge Orders     None         Drenda Freeze, MD 09/11/22 [phone]

## 2022-09-12 ENCOUNTER — Observation Stay (HOSPITAL_BASED_OUTPATIENT_CLINIC_OR_DEPARTMENT_OTHER): Payer: Medicare Other

## 2022-09-12 ENCOUNTER — Other Ambulatory Visit: Payer: Self-pay

## 2022-09-12 ENCOUNTER — Encounter (HOSPITAL_COMMUNITY): Payer: Self-pay | Admitting: Internal Medicine

## 2022-09-12 ENCOUNTER — Observation Stay (HOSPITAL_COMMUNITY): Payer: Medicare Other

## 2022-09-12 ENCOUNTER — Emergency Department (HOSPITAL_COMMUNITY): Payer: Medicare Other

## 2022-09-12 DIAGNOSIS — I10 Essential (primary) hypertension: Secondary | ICD-10-CM

## 2022-09-12 DIAGNOSIS — H548 Legal blindness, as defined in USA: Secondary | ICD-10-CM | POA: Diagnosis not present

## 2022-09-12 DIAGNOSIS — E785 Hyperlipidemia, unspecified: Secondary | ICD-10-CM | POA: Diagnosis not present

## 2022-09-12 DIAGNOSIS — I639 Cerebral infarction, unspecified: Secondary | ICD-10-CM | POA: Diagnosis not present

## 2022-09-12 DIAGNOSIS — R4182 Altered mental status, unspecified: Secondary | ICD-10-CM | POA: Diagnosis not present

## 2022-09-12 LAB — TROPONIN I (HIGH SENSITIVITY): Troponin I (High Sensitivity): 5 ng/L (ref <18)

## 2022-09-12 LAB — LIPID PANEL
Cholesterol: 88 mg/dL (ref 0–200)
HDL: 48 mg/dL (ref >40)
Total CHOL/HDL Ratio: 1.8 RATIO
Triglycerides: <10 mg/dL (ref <150)

## 2022-09-12 LAB — URINE CULTURE: Culture: NO GROWTH

## 2022-09-12 LAB — AMMONIA: Ammonia: <10 umol/L (ref 9–35)

## 2022-09-12 LAB — ECHOCARDIOGRAM COMPLETE
Area-P 1/2: 3.97 cm2
Height: 65 in
S' Lateral: 2.1 cm
Weight: 2240 oz

## 2022-09-12 LAB — HEMOGLOBIN A1C
Hgb A1c MFr Bld: 5.4 % (ref 4.8–5.6)
Mean Plasma Glucose: 108 mg/dL

## 2022-09-12 LAB — LDL CHOLESTEROL, DIRECT: Direct LDL: 28 mg/dL (ref 0–99)

## 2022-09-12 LAB — FOLATE: Folate: >40 ng/mL (ref >5.9)

## 2022-09-12 LAB — VITAMIN B12: Vitamin B-12: 698 pg/mL (ref 180–914)

## 2022-09-12 MED ORDER — ACETAMINOPHEN 325 MG PO TABS
650.0000 mg | ORAL_TABLET | ORAL | Status: DC | PRN
  Administered 2022-09-15 – 2022-09-17 (×3): 650 mg via ORAL
  Filled 2022-09-12 (×3): qty 2

## 2022-09-12 MED ORDER — PERFLUTREN LIPID MICROSPHERE
1.0000 mL | INTRAVENOUS | Status: AC | PRN
  Administered 2022-09-12: 3 mL via INTRAVENOUS

## 2022-09-12 MED ORDER — CLOPIDOGREL BISULFATE 75 MG PO TABS
75.0000 mg | ORAL_TABLET | Freq: Every day | ORAL | Status: DC
  Administered 2022-09-12 – 2022-09-17 (×6): 75 mg via ORAL
  Filled 2022-09-12 (×6): qty 1

## 2022-09-12 MED ORDER — ATORVASTATIN CALCIUM 10 MG PO TABS
10.0000 mg | ORAL_TABLET | Freq: Every day | ORAL | Status: DC
  Administered 2022-09-12 – 2022-09-17 (×6): 10 mg via ORAL
  Filled 2022-09-12 (×6): qty 1

## 2022-09-12 MED ORDER — LORAZEPAM 2 MG/ML IJ SOLN
0.5000 mg | Freq: Once | INTRAMUSCULAR | Status: AC
  Administered 2022-09-12: 0.5 mg via INTRAVENOUS
  Filled 2022-09-12: qty 1

## 2022-09-12 MED ORDER — LORAZEPAM 0.5 MG PO TABS
0.5000 mg | ORAL_TABLET | Freq: Four times a day (QID) | ORAL | Status: DC | PRN
  Filled 2022-09-12: qty 1

## 2022-09-12 MED ORDER — DIGOXIN 0.25 MG/ML IJ SOLN
0.5000 mg | Freq: Once | INTRAMUSCULAR | Status: AC
  Administered 2022-09-12: 0.5 mg via INTRAVENOUS
  Filled 2022-09-12: qty 2

## 2022-09-12 MED ORDER — ACETAMINOPHEN 650 MG RE SUPP: 650.0000 mg | RECTAL | Status: DC | PRN

## 2022-09-12 MED ORDER — ACETAMINOPHEN 160 MG/5ML PO SOLN: 650.0000 mg | ORAL | Status: DC | PRN

## 2022-09-12 MED ORDER — STROKE: EARLY STAGES OF RECOVERY BOOK
Freq: Once | Status: AC
  Filled 2022-09-12: qty 1

## 2022-09-12 MED ORDER — HYDRALAZINE HCL 20 MG/ML IJ SOLN
10.0000 mg | Freq: Four times a day (QID) | INTRAMUSCULAR | Status: DC | PRN
  Administered 2022-09-13: 10 mg via INTRAVENOUS
  Filled 2022-09-12: qty 1

## 2022-09-12 NOTE — Progress Notes (Signed)
Arrived per stretcher from ED. Placed in bed and assessment started

## 2022-09-12 NOTE — ED Notes (Signed)
Nurse is aware Abnormal vitals

## 2022-09-12 NOTE — ED Notes (Signed)
Per provider Dr [place] we allow permissive hypertension up to 220/120 for 24-48 hours following an acute stroke

## 2022-09-12 NOTE — ED Notes (Addendum)
Provider [place] notified of patient's agitation and tachycardia. EKG taken at this time. Cardiac leads readjusted and patient asked to lay still while EKG was taken.

## 2022-09-12 NOTE — Evaluation (Signed)
Physical Therapy Evaluation Patient Details Name: [name] [name] MRN: [id number] DOB: 10-18-1923 Today's Date: 09/12/2022  History of Present Illness  Pt is a 88 y/o female who presented with AMS and progressive weakness. MRI brain showed 68m acute infarct of posterior L frontal lobe and multiple scattered chronic micro hemorrhages in cerebrum and cerebellum. PMH: asthma, HTN, GERD, glaucoma with blindness, CVA, PVD  Clinical Impression   Pt admitted secondary to problem above with deficits below. Patient confused during session and provided different information re: home set-up and frequency of aide than she provided to OT earlier today. States she was ambulatory with a RW modified independent, but ?accuracy. Pt currently resisting attempt to sit at EOB despite the fact she was begging RN to sit up on PTs arrival. Appeared fearful despite PT explaining step by step what to do and expect. Based on current limitations, anticipate pt will need SNF prior to return home. Will continue to assess as she may progress more quickly if cognition improves.  Anticipate patient will benefit from PT to address problems listed below.Will continue to follow acutely to maximize functional mobility independence and safety.          Recommendations for follow up therapy are one component of a multi-disciplinary discharge planning process, led by the attending physician.  Recommendations may be updated based on patient status, additional functional criteria and insurance authorization.  Follow Up Recommendations Skilled nursing-short term rehab (<3 hours/day) Can patient physically be transported by private vehicle: No    Assistance Recommended at Discharge Frequent or constant Supervision/Assistance  Patient can return home with the following  Two people to help with walking and/or transfers;Assistance with cooking/housework;Direct supervision/assist for medications management;Direct supervision/assist for  financial management;Assist for transportation;Help with stairs or ramp for entrance    Equipment Recommendations None recommended by PT  Recommendations for Other Services       Functional Status Assessment Patient has had a recent decline in their functional status and demonstrates the ability to make significant improvements in function in a reasonable and predictable amount of time.     Precautions / Restrictions Precautions Precautions: Fall;Other (comment) Precaution Comments: blind; monitor BP and HR Restrictions Weight Bearing Restrictions: No      Mobility  Bed Mobility Overal bed mobility: Needs Assistance Bed Mobility: Supine to Sit     Supine to sit: Mod assist     General bed mobility comments: stating she wants someone to help her sit up; pt able to assist with moving legs over EOB, as beginning to assist with raising torso, she resists and returns to supine pulling her legs back up onto the bed; seemed afraid    Transfers                        Ambulation/Gait                  Stairs            Wheelchair Mobility    Modified Rankin (Stroke Patients Only) Modified Rankin (Stroke Patients Only) Pre-Morbid Rankin Score: Moderately severe disability Modified Rankin: Severe disability     Balance Overall balance assessment:  (unable to assess sitting or standing balance)                                           Pertinent Vitals/Pain  Home Living Family/patient expects to be discharged to:: Private residence Living Arrangements: Children Available Help at Discharge: Family;Available 24 hours/day;Personal care attendant Type of Home: House Home Access: Ramped entrance       Home Layout: One level Home Equipment: Grab bars - tub/shower;Shower seat;Grab bars - toilet;BSC/3in1;Rollator (4 wheels);Wheelchair - Publishing copy (2 wheels) Additional Comments: tells PT she has an aide "on some days"  and does not mention that son is disabled (?accuracy)    Prior Function Prior Level of Function : Needs assist;Patient poor historian/Family not available             Mobility Comments: tells PT she has been walking with RW by herself, uses w/c when leaving the house ADLs Comments: reports able to dress self, aide assists with showering tasks. assist with IADLs     [name] Dominance   Dominant [name]: Right    Extremity/Trunk Assessment   Upper Extremity Assessment Upper Extremity Assessment: Defer to OT evaluation    Lower Extremity Assessment Lower Extremity Assessment: Generalized weakness    Cervical / Trunk Assessment Cervical / Trunk Assessment:  (unable to assess as refused to sit EOB)  Communication   Communication: No difficulties  Cognition Arousal/Alertness: Awake/alert Behavior During Therapy: Flat affect Overall Cognitive Status: No family/caregiver present to determine baseline cognitive functioning                                 General Comments: states name; not oriented to place and unable to recall even after oriented by PT; provides different information re: home set-up and prior status than she provided to OT earlier today; at end of session states, "I'll just go back inside now"        General Comments General comments (skin integrity, edema, etc.): BP initially 184/82 with HR 90 and pt begging RN to sit up on EOB as PT entered room. After she resisted coming to sit, BP up to 189/95 HR 102.    Exercises     Assessment/Plan    PT Assessment Patient needs continued PT services  PT Problem List Decreased strength;Decreased activity tolerance;Decreased mobility;Decreased cognition;Decreased knowledge of use of DME       PT Treatment Interventions DME instruction;Gait training;Functional mobility training;Therapeutic activities;Therapeutic exercise;Balance training;Neuromuscular re-education;Cognitive remediation;Patient/family education     PT Goals (Current goals can be found in the Care Plan section)  Acute Rehab PT Goals Patient Stated Goal: unable to state PT Goal Formulation: Patient unable to participate in goal setting Time For Goal Achievement: 09/26/22 Potential to Achieve Goals: Fair    Frequency Min 2X/week     Co-evaluation               AM-PAC PT "6 Clicks" Mobility  Outcome Measure Help needed turning from your back to your side while in a flat bed without using bedrails?: A Lot Help needed moving from lying on your back to sitting on the side of a flat bed without using bedrails?: Total Help needed moving to and from a bed to a chair (including a wheelchair)?: Total Help needed standing up from a chair using your arms (e.g., wheelchair or bedside chair)?: Total Help needed to walk in hospital room?: Total Help needed climbing 3-5 steps with a railing? : Total 6 Click Score: 7    End of Session   Activity Tolerance: Other (comment) (self limiting; ?fearful) Patient left: with call bell/phone within reach;in bed (on ED stretcher)  Nurse Communication: Mobility status PT Visit Diagnosis: Muscle weakness (generalized) (M62.81);Difficulty in walking, not elsewhere classified (R26.2)    Time: 0375-4360 PT Time Calculation (min) (ACUTE ONLY): 16 min   Charges:   PT Evaluation $PT Eval Moderate Complexity: [place], PT Acute Rehabilitation Services  Office [phone]   Rexanne Mano 09/12/2022, 5:05 PM

## 2022-09-12 NOTE — ED Notes (Signed)
Provider notified that the patient is refusing PO meds and reports that she wants to go home. Despite this RN's best effort to educate the patient on the medication and plan of care, patient reports that she is ready to go home. PT at bedside reports concern for the patient's BP due to SBP being greater than 180. Patient trying to get out of bed before PT arrival and reports that she cannot walk. Patient reports that she is refusing medicine and water and is not willing to try it despite this Rns best efforts to educate. Patient reports to this RN that she will stop yelling and will try her best to communicate more clearly so that this RN will know what she needs.

## 2022-09-12 NOTE — Assessment & Plan Note (Signed)
Chronic B blindness

## 2022-09-12 NOTE — ED Notes (Signed)
Medical doctors at bedside.

## 2022-09-12 NOTE — ED Notes (Signed)
Patient notified that there is no other room for her to go to right now and to please be patient with the staff. Patient was thanked for her patience with the facility staff members and report that she will stay in the bed.

## 2022-09-12 NOTE — ED Notes (Signed)
Another EKG completed at this time after Dig. administered

## 2022-09-12 NOTE — Assessment & Plan Note (Addendum)
66m acute stroke on MRI, wondering if she had a much larger TIA though that was responsible for her transient symptoms? Stroke pathway Tele monitor Neuro consult 2d echo FLP, A1C Continue home plavix for the moment (apparently intolerant to ASA) Defer vascular imaging to neurology.

## 2022-09-12 NOTE — ED Notes (Addendum)
Nurse Mickel Baas reports that it is ok to send the patient at this time via secure chat

## 2022-09-12 NOTE — ED Notes (Signed)
Dr Bridgett Larsson, Dr. Alcario Drought, and Dr [place] have been notified of the patients vitals and agitation via secure chat. Floor nurse Mickel Baas also in the chat and reports understanding of the patients situation

## 2022-09-12 NOTE — Progress Notes (Signed)
Echocardiogram 2D Echocardiogram has been performed.  Oneal Deputy Macaela Presas RDCS 09/12/2022, 10:42 AM

## 2022-09-12 NOTE — ED Notes (Addendum)
Provider notified of patient's anxiety level at this time and notified of the tachycardia that came with such

## 2022-09-12 NOTE — Assessment & Plan Note (Signed)
FLP pending Continue lipitor

## 2022-09-12 NOTE — Evaluation (Signed)
Occupational Therapy Evaluation Patient Details Name: [name] [name] MRN: [id number] DOB: [birth year] Today's Date: 09/12/2022   History of Present Illness Pt is a 88 y/o female who presented with AMS and progressive weakness. MRI brain showed 54m acute infarct of posterior L frontal lobe and multiple scattered chronic micro hemorrhages in cerebrum and cerebellum. PMH: asthma, HTN, GERD, glaucoma, CVA, PVD   Clinical Impression   PTA, pt reports living with disabled son, typically ambulatory with RW and receives light assist for ADLs (primarily showering) w/ daily aide assistance. Pt presents w/ deficits noted below and initially engaging in OT eval with PLOF interview, vision assessment, MMT and prep for OOB tasks w/ assist to don shoes. However when it was time to engage in OCatherinetasks, pt adamantly declined until she had received something to eat (not mentioned at all during start of session) and unable to redirect to attempt tasks. Will hold DC recs until OOB ADL assessment completed.       Recommendations for follow up therapy are one component of a multi-disciplinary discharge planning process, led by the attending physician.  Recommendations may be updated based on patient status, additional functional criteria and insurance authorization.   Follow Up Recommendations  Other (comment) (TBD with OOB ADL assessment)     Assistance Recommended at Discharge Frequent or constant Supervision/Assistance  Patient can return home with the following      Functional Status Assessment  Patient has had a recent decline in their functional status and demonstrates the ability to make significant improvements in function in a reasonable and predictable amount of time.  Equipment Recommendations  Other (comment) (TBD after OOB ADL assessment)    Recommendations for Other Services       Precautions / Restrictions Precautions Precautions: Fall;Other (comment) Precaution Comments: monitor  BP Restrictions Weight Bearing Restrictions: No      Mobility Bed Mobility               General bed mobility comments: participatory in MMT, PLOF interview and donning shoes for OOB - however, once time to initiate EOB, pt declined until she had something to eat    Transfers                   General transfer comment: declined      Balance                                           ADL either performed or assessed with clinical judgement   ADL Overall ADL's : Needs assistance/impaired Eating/Feeding: Minimal assistance;Bed level   Grooming: Minimal assistance;Bed level               Lower Body Dressing: Total assistance;Bed level Lower Body Dressing Details (indicate cue type and reason): asssist to don sandals per pt request of needing these to stand               General ADL Comments: difficult to fully discern due to pt decline of EOB/OOB until after food provided (no lunch tray in room and unsure of arrival time)     Vision Baseline Vision/History: 2 Legally blind;3 Glaucoma Ability to See in Adequate Light: 3 Highly impaired Patient Visual Report: No change from baseline Vision Assessment?: Vision impaired- to be further tested in functional context Additional Comments: glaucoma, reports able to see outlines of people, bright lights hurt eyes,  able to see shadows     Perception     Praxis      Pertinent Vitals/Pain Pain Assessment Pain Assessment: No/denies pain     Hand Dominance Right   Extremity/Trunk Assessment Upper Extremity Assessment Upper Extremity Assessment: Generalized weakness   Lower Extremity Assessment Lower Extremity Assessment: Defer to PT evaluation       Communication Communication Communication: No difficulties   Cognition Arousal/Alertness: Awake/alert Behavior During Therapy: Flat affect Overall Cognitive Status: No family/caregiver present to determine baseline cognitive  functioning                                 General Comments: pleasant, able to follow one step commands though self limiting. some decreased insight into deficits, purpose of therapy evals. some memory deficits based on pt difficulty recalling PLOF     General Comments  BP 180/76. pt reporting dizziness    Exercises     Shoulder Instructions      Home Living Family/patient expects to be discharged to:: Private residence Living Arrangements: Children Available Help at Discharge: Family;Available 24 hours/day;Personal care attendant Type of Home: House Home Access: Stairs to enter [employer] of Steps: 2 Entrance Stairs-Rails: Right;Left Home Layout: One level     Bathroom Shower/Tub: Teacher, early years/pre: Handicapped height     Home Equipment: Grab bars - tub/shower;Shower seat;Grab bars - toilet;BSC/3in1;Rollator (4 wheels);Wheelchair - Publishing copy (2 wheels)   Additional Comments: has an aide daily for 2 hours, pt reports aide assists her disabled son as well      Prior Functioning/Environment Prior Level of Function : Needs assist;Patient poor historian/Family not available             Mobility Comments: walking with RW up until 2 weeks ago, difficult to discern how she has been moving in the past 2 weeks ADLs Comments: reports able to dress self, aide assists with showering tasks. assist with IADLs        OT Problem List: Decreased strength;Decreased activity tolerance;Decreased cognition      OT Treatment/Interventions: Self-care/ADL training;Therapeutic exercise;Energy conservation;DME and/or AE instruction;Therapeutic activities    OT Goals(Current goals can be found in the care plan section) Acute Rehab OT Goals Patient Stated Goal: get something to eat OT Goal Formulation: With patient Time For Goal Achievement: 09/26/22 Potential to Achieve Goals: Fair  OT Frequency: Min 2X/week    Co-evaluation               AM-PAC OT "6 Clicks" Daily Activity     Outcome Measure Help from another person eating meals?: A Little Help from another person taking care of personal grooming?: A Little Help from another person toileting, which includes using toliet, bedpan, or urinal?: A Lot Help from another person bathing (including washing, rinsing, drying)?: A Lot Help from another person to put on and taking off regular upper body clothing?: A Little Help from another person to put on and taking off regular lower body clothing?: A Lot 6 Click Score: 15   End of Session Nurse Communication: Mobility status;Other (comment) (declines OOB until food provided. had not eaten breakfast)  Activity Tolerance: Other (comment) (self limiting) Patient left: in bed;with call bell/phone within reach  OT Visit Diagnosis: Other abnormalities of gait and mobility (R26.89);Muscle weakness (generalized) (M62.81)                Time: 1898-4210 OT Time Calculation (min):  21 min Charges:  OT General Charges $OT Visit: 1 Visit OT Evaluation $OT Eval Low Complexity: 1 Low  Malachy Chamber, OTR/L Acute Rehab Services Office: [phone]   Layla Maw 09/12/2022, 12:36 PM

## 2022-09-12 NOTE — Progress Notes (Signed)
PROGRESS NOTE    [name] [name]  EXH:[id number] DOB: 1924-02-25 DOA: 09/11/2022 PCP: Sandrea Hughs, NP    Brief Narrative:   [name] [name] is a 88 y.o. 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  Patient reports onset after eating dinner and family report that she has been less responsive and lethargic.  Last known normal was at [place].  Per patient's son, patient was doing fine earlier in the day and following dinner; he noticed that the patient was sleepy and not cooperative.  She was then noted to be slumped over in the chair on the right side and had a hard time waking her up.  Patient then was attempted to ambulate to her bedroom but had much difficulty.  Son reports similar episode 6 months ago and denies any new medications.  Also her appetite has been fine over the last few days.  No seizure activity reported.  In the ED, temperature 98.3 F, HR 73, RR 16, BP 145/69, SpO2 100% on room air.  WBC 6.4, hemoglobin 11.6, platelets 187.  Sodium 138, potassium 4.3, chloride 106, CO2 24, glucose 133, BUN 18, creatinine 1.04.  AST 26, ALT 23, total bilirubin 0.8.  High sensitive troponin 7> 5.  TSH 2.366.  Urinalysis negative.  EtOH level less than 10.  Chest x-ray with cardiomegaly with mildly distended pulmonary vasculature.  CT head without contrast with areas of low density in the high right frontoparietal region concerning for acute versus subacute infarct; atrophy, chronic small vessel disease.  Neurology was consulted.  [facility] consulted for admission for further evaluation management of suspected acute CVA.  Assessment & Plan:    Acute/subacute CVA right frontal parietal region Patient presenting to ED via EMS with weakness, lethargy and confusion.  Patient is afebrile without leukocytosis.  CT head with low-density high right frontoparietal region concerning for  acute versus subacute infarct.  MRI brain notable for 3 mm acute ischemic nonhemorrhagic infarct posterior left frontal lobe and innumerable scattered chronic micro hemorrhages involving the left greater than right cerebral hemispheres as well as the cerebellum.  TTE with LVEF 45-50%, LV mildly decreased function, LV with no regional wall motion normalities, mild concentric LVH, trivial MR, IVC normal in size.  TSH 2.366.  B12 698. -- Neurology following, appreciate assistance -- LDL: Pending -- Hemoglobin A1c: Pending -- Vascular ultrasound carotid: Pending -- Transcranial Doppler ultrasound: Pending -- PT/OT/SLP evaluation: Pending -- Plavix 75 mg p.o. daily -- Atorvastatin 10 mg p.o. daily -- Allow permissive hypertension to 220/120 in the setting of acute CVA -- Monitor on telemetry  Essential hypertension On amlodipine outpatient, holding to allow permissive hypertension in the setting of acute CVA. -- Hydralazine '10mg'$  IV q6h PRN SBP >220 or DBP >120  Hyperlipidemia Total cholesterol 88, HDL 48. -- Home atorvastatin 10 mg p.o. daily -- Fasting LDL: Pending  Blindness 2/2 ARMD/glaucoma -- Supportive care    DVT prophylaxis: SCD's Start: 09/12/22 0314    Code Status: DNR Family Communication: No family present at bedside this morning  Disposition Plan:  Level of care: Telemetry Medical Status is: Observation The patient remains OBS appropriate and will d/c before 2 midnights.    Consultants:  Neurology  Procedures:  TTE Carotid artery ultrasound: Pending Transcranial Doppler: Pending  Antimicrobials:  None   Subjective: Patient seen examined bedside, resting company.  RN present.  No specific  complaints this morning.  Feels her overall weakness and confusion have resolved and feels at her normal baseline.  Other than her blindness which is chronic no focal deficits observed this morning.  Asking for something to eat.  Seen by neurology with further testing  initiated.  No other specific questions or concerns at this time.  Denies headache, no chest pain, no palpitations, no shortness of breath, no abdominal pain, no fever/chills/night sweats, no nausea/vomiting/diarrhea, no paresthesias.  No acute events overnight per nursing staff.  Called  Objective: Vitals:   09/12/22 1146 09/12/22 1200 09/12/22 1215 09/12/22 1230  BP:  (!) 180/76 (!) 200/90 (!) 199/97  Pulse:      Resp:      Temp: 97.9 F (36.6 C)     TempSrc: Oral     SpO2:      Weight:      Height:       No intake or output data in the 24 hours ending 09/12/22 1403 Filed Weights   09/11/22 2216  Weight: 63.5 kg    Examination:  Physical Exam: GEN: NAD, alert and oriented x 3, thin/elderly in appearance HEENT: NCAT, PERRL, EOMI, sclera clear, MMM PULM: CTAB w/o wheezes/crackles, normal respiratory effort, on room air CV: RRR w/o M/G/R GI: abd soft, NTND, NABS, no R/G/M MSK: Trace lower extremity peripheral edema, muscle strength globally intact 5/5 bilateral upper/lower extremities NEURO: CN II-XII intact other than blindness which is chronic, no focal deficits, sensation to light touch intact PSYCH: normal mood/affect Integumentary: dry/intact, no rashes or wounds    Data Reviewed: I have personally reviewed following labs and imaging studies  CBC: Recent Labs  Lab 09/11/22 2204 09/11/22 2208  WBC 6.4  --   NEUTROABS 3.5  --   HGB 11.6* 12.2  HCT 33.6* 36.0  MCV 91.6  --   PLT 187  --    Basic Metabolic Panel: Recent Labs  Lab 09/11/22 2204 09/11/22 2208  NA 138 136  K 4.3 4.5  CL 106 105  CO2 24  --   GLUCOSE 133* 131*  BUN 18 21  CREATININE 1.04* 1.10*  CALCIUM 8.9  --    GFR: Estimated Creatinine Clearance: 27.5 mL/min (A) (by C-G formula based on SCr of 1.1 mg/dL (H)). Liver Function Tests: Recent Labs  Lab 09/11/22 2204  AST 26  ALT 23  ALKPHOS 67  BILITOT 0.8  PROT 6.3*  ALBUMIN 3.3*   No results for input(s): "LIPASE", "AMYLASE"  in the last 168 hours. No results for input(s): "AMMONIA" in the last 168 hours. Coagulation Profile: Recent Labs  Lab 09/11/22 2204  INR 1.1   Cardiac Enzymes: No results for input(s): "CKTOTAL", "CKMB", "CKMBINDEX", "TROPONINI" in the last 168 hours. BNP (last 3 results) No results for input(s): "PROBNP" in the last 8760 hours. HbA1C: No results for input(s): "HGBA1C" in the last 72 hours. CBG: No results for input(s): "GLUCAP" in the last 168 hours. Lipid Profile: Recent Labs    09/12/22 0610  CHOL 88  HDL 48  LDLCALC NOT CALCULATED  TRIG <10  CHOLHDL 1.8   Thyroid Function Tests: Recent Labs    09/11/22 2220  TSH 2.366   Anemia Panel: Recent Labs    09/11/22 2220 09/11/22 2304  VITAMINB12  --  698  FOLATE >40.0  --    Sepsis Labs: No results for input(s): "PROCALCITON", "LATICACIDVEN" in the last 168 hours.  No results found for this or any previous visit (from the past 240 hour(s)).  Radiology Studies: ECHOCARDIOGRAM COMPLETE  Result Date: 09/12/2022    ECHOCARDIOGRAM REPORT   Patient Name:   CALEB PRIGMORE Date of Exam: 09/12/2022 Medical Rec #:  [id number]      Height:       65.0 in Accession #:    [id number]     Weight:       140.0 lb Date of Birth:  1924-05-02     BSA:          1.700 m Patient Age:    89 years       BP:           208/183 mmHg Patient Gender: F              HR:           71 bpm. Exam Location:  Inpatient Procedure: 2D Echo, Color Doppler and Cardiac Doppler Indications:    Stroke i63.9  History:        Patient has prior history of Echocardiogram examinations, most                 recent 12/10/2015. Risk Factors:Hypertension and Dyslipidemia.  Sonographer:    Raquel Sarna Senior RDCS Referring Phys: [place]  1. Left ventricular ejection fraction, by estimation, is 45 to 50%. The left ventricle has mildly decreased function. The left ventricle has no regional wall motion abnormalities. There is mild concentric left  ventricular hypertrophy. Left ventricular diastolic parameters are indeterminate.  2. Right ventricular systolic function is normal. The right ventricular size is mildly enlarged.  3. The mitral valve is normal in structure. Trivial mitral valve regurgitation. No evidence of mitral stenosis.  4. The aortic valve is tricuspid. Aortic valve regurgitation is not visualized. Aortic valve sclerosis is present, with no evidence of aortic valve stenosis.  5. The inferior vena cava is normal in size with greater than 50% respiratory variability, suggesting right atrial pressure of 3 mmHg. FINDINGS  Left Ventricle: Left ventricular ejection fraction, by estimation, is 45 to 50%. The left ventricle has mildly decreased function. The left ventricle has no regional wall motion abnormalities. The left ventricular internal cavity size was normal in size. There is mild concentric left ventricular hypertrophy. Left ventricular diastolic parameters are indeterminate. Right Ventricle: The right ventricular size is mildly enlarged. No increase in right ventricular wall thickness. Right ventricular systolic function is normal. Left Atrium: Left atrial size was normal in size. Right Atrium: Right atrial size was normal in size. Pericardium: There is no evidence of pericardial effusion. Presence of epicardial fat layer. Mitral Valve: The mitral valve is normal in structure. Mild mitral annular calcification. Trivial mitral valve regurgitation. No evidence of mitral valve stenosis. Tricuspid Valve: The tricuspid valve is normal in structure. Tricuspid valve regurgitation is not demonstrated. No evidence of tricuspid stenosis. Aortic Valve: The aortic valve is tricuspid. Aortic valve regurgitation is not visualized. Aortic valve sclerosis is present, with no evidence of aortic valve stenosis. Pulmonic Valve: The pulmonic valve was normal in structure. Pulmonic valve regurgitation is trivial. No evidence of pulmonic stenosis. Aorta: The  aortic root is normal in size and structure. Venous: The inferior vena cava is normal in size with greater than 50% respiratory variability, suggesting right atrial pressure of 3 mmHg. IAS/Shunts: No atrial level shunt detected by color flow Doppler.  LEFT VENTRICLE PLAX 2D LVIDd:         3.30 cm   Diastology LVIDs:         2.10  cm   LV e' medial:    7.51 cm/s LV PW:         1.10 cm   LV E/e' medial:  12.0 LV IVS:        1.00 cm   LV e' lateral:   7.07 cm/s LVOT diam:     1.80 cm   LV E/e' lateral: 12.7 LV SV:         50 LV SV Index:   29 LVOT Area:     2.54 cm  RIGHT VENTRICLE RV S prime:     11.70 cm/s TAPSE (M-mode): 2.5 cm LEFT ATRIUM             Index        RIGHT ATRIUM           Index LA diam:        3.20 cm 1.88 cm/m   RA Area:     18.40 cm LA Vol (A2C):   38.3 ml 22.53 ml/m  RA Volume:   44.50 ml  26.18 ml/m LA Vol (A4C):   51.6 ml 30.35 ml/m LA Biplane Vol: 45.0 ml 26.47 ml/m  AORTIC VALVE LVOT Vmax:   85.50 cm/s LVOT Vmean:  63.100 cm/s LVOT VTI:    0.197 m  AORTA Ao Root diam: 2.80 cm Ao Asc diam:  3.30 cm MITRAL VALVE MV Area (PHT): 3.97 cm     SHUNTS MV Decel Time: 191 msec     Systemic VTI:  0.20 m MV E velocity: 90.00 cm/s   Systemic Diam: 1.80 cm MV A velocity: 104.00 cm/s MV E/A ratio:  0.87 Kardie Tobb DO Electronically signed by Berniece Salines DO Signature Date/Time: 09/12/2022/11:00:21 AM    Final    CT HEAD WO CONTRAST (5MM)  Result Date: 09/12/2022 CLINICAL DATA:  Mental status change, unknown cause EXAM: CT HEAD WITHOUT CONTRAST TECHNIQUE: Contiguous axial images were obtained from the base of the skull through the vertex without intravenous contrast. RADIATION DOSE REDUCTION: This exam was performed according to the departmental dose-optimization program which includes automated exposure control, adjustment of the mA and/or kV according to patient size and/or use of iterative reconstruction technique. COMPARISON:  03/11/2022 FINDINGS: Brain: There is atrophy and chronic small vessel  disease changes. Areas of low-density noted in the high right frontoparietal region, new since June concerning for acute to subacute infarct. No hemorrhage or hydrocephalus. No midline shift. Vascular: No hyperdense vessel or unexpected calcification. Skull: No acute calvarial abnormality. Sinuses/Orbits: No acute findings Other: None IMPRESSION: Areas of low-density in the high right frontoparietal region concerning for acute or subacute infarct. This could be further evaluated with MRI as clinically indicated. Atrophy, chronic small vessel disease. Electronically Signed   By: Rolm Baptise M.D.   On: 09/12/2022 01:10   MR BRAIN WO CONTRAST  Result Date: 09/12/2022 CLINICAL DATA:  Initial evaluation for mental status change, unknown cause. EXAM: MRI HEAD WITHOUT CONTRAST TECHNIQUE: Multiplanar, multiecho pulse sequences of the brain and surrounding structures were obtained without intravenous contrast. COMPARISON:  Prior CT from 03/11/2022. FINDINGS: Brain: Cerebral volume within normal limits for age. Patchy T2/FLAIR hyperintensity involving the periventricular deep white matter both cerebral hemispheres as well as the pons, most consistent with chronic small vessel ischemic disease, moderately advanced in nature. Few scattered small remote bilateral cerebellar infarcts noted. 3 mm focus of restricted diffusion involving the posterior left frontal lobe, consistent with a punctate acute ischemic infarct (series 5, image 78). No associated hemorrhage or mass effect.  No other convincing evidence for acute or subacute ischemia elsewhere within the brain. No acute intracranial hemorrhage. Innumerable scattered chronic micro hemorrhages noted involving the left greater than right cerebral hemispheres as well as the cerebellum. 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 Findings are nonspecific, but suspected to reflect changes of cerebral amyloid angiopathy. No mass lesion, midline  shift or mass effect. No hydrocephalus or extra-axial fluid collection. Pituitary gland and suprasellar region within normal limits. Vascular: Major intracranial vascular flow voids are maintained. Skull and upper cervical spine: Craniocervical junction normal. Bone marrow signal intensity within normal limits. No scalp soft tissue abnormality. Sinuses/Orbits: Patient status post bilateral ocular lens replacement. Paranasal sinuses are largely clear. No mastoid effusion. Other: None. IMPRESSION: 1. 3 mm acute ischemic nonhemorrhagic infarct involving the posterior left frontal lobe. 2. Innumerable scattered chronic micro hemorrhages involving the left greater than right cerebral hemispheres as well as the cerebellum. Findings are nonspecific, but suspected to reflect changes of cerebral amyloid angiopathy. 3. Moderately advanced chronic microvascular ischemic disease with a few scattered small remote bilateral cerebellar infarcts. Electronically Signed   By: Jeannine Boga M.D.   On: 09/12/2022 01:03   DG Chest Port 1 View  Result Date: 09/11/2022 CLINICAL DATA:  Altered mental status. EXAM: PORTABLE CHEST 1 VIEW COMPARISON:  03/11/2022. FINDINGS: The heart is enlarged the mediastinal contour is stable. There is atherosclerotic calcification of the aorta. The pulmonary vasculature is mildly distended. No consolidation, effusion, or pneumothorax. No acute osseous abnormality. IMPRESSION: Cardiomegaly with mildly distended pulmonary vasculature. Electronically Signed   By: Brett Fairy M.D.   On: 09/11/2022 22:36        Scheduled Meds:  [START ON 09/13/2022]  stroke: early stages of recovery book   Does not apply Once   atorvastatin  10 mg Oral Daily   clopidogrel  75 mg Oral Daily   sodium chloride flush  3 mL Intravenous Once   Continuous Infusions:   LOS: 0 days    Time spent: 52 minutes spent on chart review, discussion with nursing staff, consultants, updating family and  interview/physical exam; more than 50% of that time was spent in counseling and/or coordination of care.    Izzak Fries J [place] DO [facility] Available via Epic secure chat 7am-7pm After these hours, please refer to coverage provider listed on [email or web address] 09/12/2022, 2:03 PM

## 2022-09-12 NOTE — Assessment & Plan Note (Signed)
Hold home BP meds in setting of acute stroke.

## 2022-09-12 NOTE — Progress Notes (Addendum)
STROKE TEAM PROGRESS NOTE   INTERVAL HISTORY Patient was evaluated at bedside. She was in good spirits. She had impaired cognition and minor focal neurologic deficits, namely diminished sensation on L side. MRI shows tiny punctate left peri insular frontal infarct with CT scan done later yesterday evening shows right frontal wedge-shaped hypodensity likely compatible with acute infarct as well.  Patient has no known history of A-fib Vitals:   09/12/22 0445 09/12/22 0530 09/12/22 0600 09/12/22 0650  BP: (!) 165/67 (!) 156/72 (!) 165/67   Pulse: (!) 49 (!) 49 62   Resp: '17 18 17   '$ Temp:    98.3 F (36.8 C)  TempSrc:    Oral  SpO2: 99% 100% 100%   Weight:      Height:       CBC:  Recent Labs  Lab 09/11/22 2204 09/11/22 2208  WBC 6.4  --   NEUTROABS 3.5  --   HGB 11.6* 12.2  HCT 33.6* 36.0  MCV 91.6  --   PLT 187  --    Basic Metabolic Panel:  Recent Labs  Lab 09/11/22 2204 09/11/22 2208  NA 138 136  K 4.3 4.5  CL 106 105  CO2 24  --   GLUCOSE 133* 131*  BUN 18 21  CREATININE 1.04* 1.10*  CALCIUM 8.9  --    Lipid Panel:  Recent Labs  Lab 09/12/22 0610  CHOL 88  TRIG <10  HDL 48  CHOLHDL 1.8  VLDL NOT CALCULATED  LDLCALC NOT CALCULATED   HgbA1c: No results for input(s): "HGBA1C" in the last 168 hours. Urine Drug Screen: No results for input(s): "LABOPIA", "COCAINSCRNUR", "LABBENZ", "AMPHETMU", "THCU", "LABBARB" in the last 168 hours.  Alcohol Level  Recent Labs  Lab 09/11/22 2204  ETH <10    IMAGING past 24 hours CT HEAD WO CONTRAST (5MM)  Result Date: 09/12/2022 CLINICAL DATA:  Mental status change, unknown cause EXAM: CT HEAD WITHOUT CONTRAST TECHNIQUE: Contiguous axial images were obtained from the base of the skull through the vertex without intravenous contrast. RADIATION DOSE REDUCTION: This exam was performed according to the departmental dose-optimization program which includes automated exposure control, adjustment of the mA and/or kV according to  patient size and/or use of iterative reconstruction technique. COMPARISON:  03/11/2022 FINDINGS: Brain: There is atrophy and chronic small vessel disease changes. Areas of low-density noted in the high right frontoparietal region, new since June concerning for acute to subacute infarct. No hemorrhage or hydrocephalus. No midline shift. Vascular: No hyperdense vessel or unexpected calcification. Skull: No acute calvarial abnormality. Sinuses/Orbits: No acute findings Other: None IMPRESSION: Areas of low-density in the high right frontoparietal region concerning for acute or subacute infarct. This could be further evaluated with MRI as clinically indicated. Atrophy, chronic small vessel disease. Electronically Signed   By: Rolm Baptise M.D.   On: 09/12/2022 01:10   MR BRAIN WO CONTRAST  Result Date: 09/12/2022 CLINICAL DATA:  Initial evaluation for mental status change, unknown cause. EXAM: MRI HEAD WITHOUT CONTRAST TECHNIQUE: Multiplanar, multiecho pulse sequences of the brain and surrounding structures were obtained without intravenous contrast. COMPARISON:  Prior CT from 03/11/2022. FINDINGS: Brain: Cerebral volume within normal limits for age. Patchy T2/FLAIR hyperintensity involving the periventricular deep white matter both cerebral hemispheres as well as the pons, most consistent with chronic small vessel ischemic disease, moderately advanced in nature. Few scattered small remote bilateral cerebellar infarcts noted. 3 mm focus of restricted diffusion involving the posterior left frontal lobe, consistent with a punctate  acute ischemic infarct (series 5, image 78). No associated hemorrhage or mass effect. No other convincing evidence for acute or subacute ischemia elsewhere within the brain. No acute intracranial hemorrhage. Innumerable scattered chronic micro hemorrhages noted involving the left greater than right cerebral hemispheres as well as the cerebellum. Scattered superficial siderosis noted along the  central sulci bilaterally. Findings are nonspecific, but suspected to reflect changes of cerebral amyloid angiopathy. No mass lesion, midline shift or mass effect. No hydrocephalus or extra-axial fluid collection. Pituitary gland and suprasellar region within normal limits. Vascular: Major intracranial vascular flow voids are maintained. Skull and upper cervical spine: Craniocervical junction normal. Bone marrow signal intensity within normal limits. No scalp soft tissue abnormality. Sinuses/Orbits: Patient status post bilateral ocular lens replacement. Paranasal sinuses are largely clear. No mastoid effusion. Other: None. IMPRESSION: 1. 3 mm acute ischemic nonhemorrhagic infarct involving the posterior left frontal lobe. 2. Innumerable scattered chronic micro hemorrhages involving the left greater than right cerebral hemispheres as well as the cerebellum. Findings are nonspecific, but suspected to reflect changes of cerebral amyloid angiopathy. 3. Moderately advanced chronic microvascular ischemic disease with a few scattered small remote bilateral cerebellar infarcts. Electronically Signed   By: Jeannine Boga M.D.   On: 09/12/2022 01:03   DG Chest Port 1 View  Result Date: 09/11/2022 CLINICAL DATA:  Altered mental status. EXAM: PORTABLE CHEST 1 VIEW COMPARISON:  03/11/2022. FINDINGS: The heart is enlarged the mediastinal contour is stable. There is atherosclerotic calcification of the aorta. The pulmonary vasculature is mildly distended. No consolidation, effusion, or pneumothorax. No acute osseous abnormality. IMPRESSION: Cardiomegaly with mildly distended pulmonary vasculature. Electronically Signed   By: Brett Fairy M.D.   On: 09/11/2022 22:36    PHYSICAL EXAM General: well-appearing pleasant elderly African-American lady in no acute distress HEENT: normocephalic and atraumatic Cardiovascular: regular rate Respiratory: normal respiratory effort and on RA Gastrointestinal: non-tender and  non-distended Extremities: moving all extremities spontaneously  Mental Status: LIANETTE BROUSSARD is alert; she is oriented to person and oriented to situation. Speech was clear and fluent without evidence of aphasia.  Cranial Nerves: II:  Visual fields  could not be assessed - patient is legally blind bilaterally;  patient notes being able to see bright light on L eye III,IV, VI: no ptosis V,VII: smile symmetric, facial light touch sensation diminished on R side IX,X: uvula rises symmetrically XII: midline tongue extension without atrophy and without fasciculations  Motor: Right : Upper extremity   5/5 full power  Lower extremity   5/5 full power  Left: Upper extremity   5/5 full power Lower extremity   5/5 full power  Tone and bulk: normal tone throughout; no atrophy noted  Sensory: diminished on R side  Gait: not observed during encounter  ASSESSMENT/PLAN NIOMA MCCUBBINS is a 88 y.o. female with PMH significant for glaucoma and and legal blindness, peripheral vascular disese, GERD who presents with acute onset altered mental status with rapid spontaneous resolution. On imaging was found to have acute / subacute R frontoparietal infarct, and on repeat imaging found to have L frontal infarct.  #Non-hemorrhagic infarct of posterior L frontal lobe #Acute/ subacute infarct of R frontoparietal region Consecutive findings in multiple areas highly suspicious for cardioembolic cause.  Likely paroxysmal A-fib Code Stroke CT head shows areas of low-density in the high right frontoparietal region concerning for acute or subacute infarct. Chronic small vessel disease present. Atrophy present. MRI shows 3 mm acute ischemic nonhemorrhagic infarct involving the posterior left frontal lobe, innumerable  scattered chronic micro hemorrhages involving the left greater than right cerebral hemispheres as well as the cerebellum. Findings are nonspecific, but suspected to reflect changes of cerebral amyloid  angiopathy. Moderately advanced chronic microvascular ischemic disease with a few scattered small remote bilateral cerebellar infarcts. Carotid Doppler  pending Transcranial Doppler pending 2D Echo EF 45 - 50% with mildly decreased LV function and mild left ventricular hypertrophy LDL pending HgbA1c pending VTE prophylaxis - SCDs    Diet   Diet Heart Room service appropriate? Yes; Fluid consistency: Thin   clopidogrel 75 mg daily prior to admission, now on clopidogrel 75 mg daily. Patient reportedly intolerant to aspirin. Continue clopidogrel 75 mg daily indefinitely  Therapy recommendations:  pending Disposition:  pending PT/OT recs  Essential HTN, chronic Home meds: amlodipine , hold for now given acute stroke Stable Permissive hypertension (OK if < 220/120) but gradually normalize in 5-7 days Long-term BP goal normotensive  Hyperlipidemia, chronic Home meds:  atorvastatin 10 mg, resumed in hospital Fasting LDL pending, goal < 70 High intensity statin recommendation pending fasting LDL Continue statin at discharge  Other Active Problems to be managed by primary team #Legal blindness #GERD #Seasonal allergies #Peripheral vascular disease  Hospital day # 0  I have personally obtained history,examined this patient, reviewed notes, independently viewed imaging studies, participated in medical decision making and plan of care.ROS completed by me personally and pertinent positives fully documented  I have made any additions or clarifications directly to the above note. Agree with note above.  Patient presented with altered mental status with sleepiness which appears to improved but now complains of subjective left-sided numbness and MRI initially showed only punctate left frontal embolic infarct but CT scan done later raises concern for right frontal MCA branch infarct as well.  Recommend continue cardiac monitoring strong suspicion for paroxysmal A-fib.  Unclear if patient is a  candidate for long-term anticoagulation given her legal blindness high fall risk.  Recommend aspirin and Plavix for 3 weeks followed by Plavix alone given her history of peripheral vascular disease.  May consider prolonged cardiac monitoring to look for paroxysmal A-fib if patient can live in a supervised environment where falls can be minimized and anticoagulation may be done. No family available at the bedside.  Discussed with Dr. [place]  Greater than 50% time during this 50-minute visit with spent in counseling and coordination of care about embolic strokes discussion about risk-benefit of anticoagulation and fall risk bleeding and answering questions Antony Contras, MD Medical Director [facility] Pager: [phone] 09/12/2022 2:38 PM  To contact Stroke Continuity provider, please refer to [email or web address] After hours, contact General Neurology

## 2022-09-12 NOTE — ED Notes (Signed)
Patient awake and alert, is calling out for help, patient is concerned that her children are not aware that she is here, advised we would reach out.  Patient denies any pain, no s/s of distress, will continue to monitor.

## 2022-09-12 NOTE — ED Notes (Signed)
Patient spoke to her son, states she feels much better now.

## 2022-09-12 NOTE — H&P (Signed)
History and Physical    Patient: [name] [name] PJK:[id number] DOB: June 12, 1924 DOA: 09/11/2022 DOS: the patient was seen and examined on 09/12/2022 PCP: NgetichNelda Bucks, NP  Patient coming from: Home  Chief Complaint:  Chief Complaint  Patient presents with   Weakness   HPI: [name] [name] is a 88 y.o. 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  Pt in to ED with BLE weakness and leaning to right and episode of decreased responsiveness.  Per patient's son, patient was fine earlier in the day and they sat down to eat dinner.  After they were done eating dinner, he noticed that patient was sleepy and not cooperative and hard to get her to pay attention.  She was slumped over to the right side in the chair and they had a hard time waking her up.  They were finally able to get her up to the bedroom but noted that she would not stand up and not use her legs.  Patient reported that her legs were feeling little weak and then gradually started sounding more like herself.   Son reported the patient had a similar episode 6 months ago probably.  Denies any new medication, patient has been eating and drinking fine over the last few days, no shaking and no stiffness concerning for seizure.  Patient does not have a prior history of seizures.  Patient does have a history of mini stroke with no residual symptoms.   Per EMS, patient had dinner at 1915 and was fine. Son was trying to talk to her around 2000 and she was not responding. She seemed to be leaning on her right and would not lift her legs up when asked. EMS called and patient was pretty much non focal with no deficit. She was brought in as a code stroke. On my arrival, patient is awake, alert, talkative. She reports chronic blindness but no other focal deficit.   Review of Systems: As mentioned in the history of present illness. All other systems reviewed and are negative. Past Medical History:   Diagnosis Date   Anxiety    Asthma    Benign essential hypertension    Bradycardia, drug induced 06/11/2016   Edema extremities    GERD (gastroesophageal reflux disease)    Glaucoma    Macular degeneration    Per patient    Osteoarthritis    PVD (peripheral vascular disease) ([place])    Seasonal allergies    Stroke [facility])    Past Surgical History:  Procedure Laterality Date   [place]   Social History:  reports that she has never smoked. She has never used smokeless tobacco. She reports that she does not drink alcohol and does not use drugs.  Allergies  Allergen Reactions   Aspirin Other (See Comments)    sweats   Codeine Other (See Comments)   Iodine Other (See Comments)    Pt is not aware of this allergy, does not recall much info.   Prednisone Other (See Comments)    sweats   Lyrica [Pregabalin]     [place]obb[place], "more than sleepy"   Benzonatate Itching and Rash   Sulfa Antibiotics Other (See Comments)    Doesn't remember Other reaction(s): Unknown    Family History  Problem Relation Age of Onset   Stroke Father    Hypertension Father    Heart disease Mother    Hypertension Mother  Diabetes Mother     Prior to Admission medications   Medication Sig Start Date End Date Taking? Authorizing Provider  acetaminophen (TYLENOL) 500 MG tablet Take 500 mg by mouth every 6 (six) hours as needed for mild pain or moderate pain.     [provider]  albuterol (PROVENTIL HFA;VENTOLIN HFA) 108 (90 Base) MCG/ACT inhaler Inhale 2 puffs into the lungs every 6 (six) hours as needed for wheezing. 11/27/17   Mariea Clonts, Tiffany L, DO  alum hydroxide-mag trisilicate (GAVISCON) 10-17 MG CHEW chewable tablet Chew 2 tablets by mouth 2 (two) times daily.    [provider]  amLODipine (NORVASC) 5 MG tablet TAKE 1 TABLET (5 MG TOTAL) BY MOUTH DAILY. . 05/27/22   Sueanne Margarita, MD  atorvastatin (LIPITOR) 10 MG tablet TAKE 1 TABLET BY MOUTH  EVERY DAY 04/08/22   Ngetich, Dinah C, NP  atropine 1 % ophthalmic solution Place 1 drop into the right eye daily.  06/30/19   [provider]  Brinzolamide-Brimonidine 1-0.2 % SUSP Apply 1 drop to eye 3 (three) times daily.    [provider]  budesonide-formoterol (SYMBICORT) 80-4.5 MCG/ACT inhaler Inhale 2 puffs into the lungs 2 (two) times daily.    [provider]  clopidogrel (PLAVIX) 75 MG tablet TAKE 1 TABLET BY MOUTH EVERY DAY 02/15/22   Ngetich, Dinah C, NP  Coenzyme Q10 (COQ10) 50 MG CAPS Take 50 mg by mouth daily.    [provider]  docusate sodium (COLACE) 100 MG capsule TAKE 1 CAPSULE BY MOUTH TWICE A DAY 10/05/20   Reed, Tiffany L, DO  Emollient (CETAPHIL) cream Apply topically 2 (two) times daily. To legs 02/05/22   Ngetich, Dinah C, NP  fluticasone (FLONASE) 50 MCG/ACT nasal spray Place 1 spray into the nose daily.    [provider]  hydrocortisone (ANUSOL-HC) 2.5 % rectal cream Place 1 application rectally daily as needed for hemorrhoids or anal itching. 02/03/20   Reed, Tiffany L, DO  loratadine (CLARITIN) 10 MG tablet Take 10 mg by mouth daily. 08/16/21   [provider]  Multiple Vitamins-Minerals (PRESERVISION AREDS 2) CAPS Take 1 capsule by mouth 2 (two) times daily.    [provider]  Multiple Vitamins-Minerals (SENIOR MULTIVITAMIN PLUS) TABS Take 1 tablet by mouth daily.     [provider]  NONFORMULARY OR COMPOUNDED ITEM  Apothecary:  Peripheral Neuropathy Cream - Bupivacaine 1%, Doxepin 3%, Gabapentin 6%, Pentoxifylline 3%, Topiramate 1%, apply 1-2 grams to affected areas 3-4 times daily prn. 09/11/22   Ngetich, Dinah C, NP  Omega-3 Fatty Acids (FISH OIL) 1200 MG CPDR Take 1,200 mg by mouth daily. Take one tablet by mouth once daily    [provider]  prednisoLONE acetate (PRED FORTE) 1 % ophthalmic suspension Place 1 drop into the right eye daily. 06/13/20   [provider]   risperiDONE (RISPERDAL) 0.5 MG tablet TAKE 1 TABLET BY MOUTH TWICE A DAY 07/22/22   Eubanks, Carlos American, NP  ROCKLATAN 0.02-0.005 % SOLN Place 1 drop into the left eye daily. 04/23/21   [provider]  sodium chloride (MURO 128) 5 % ophthalmic solution Place 1 drop into the left eye every 4 (four) hours as needed for eye irritation.    [provider]  vitamin C (ASCORBIC ACID) 500 MG tablet Take 500 mg by mouth daily.    [provider]    Physical Exam: Vitals:   09/12/22 0145 09/12/22 0200 09/12/22 0230 09/12/22 0302  BP: Marland Kitchen)  150/70 (!) 154/68 (!) 146/79   Pulse: (!) 57 (!) 53 (!) 51   Resp: 17 18 (!) 26   Temp:    98.3 F (36.8 C)  TempSrc:    Oral  SpO2: 99% 100% 99%   Weight:      Height:       Constitutional: NAD, calm, comfortable Eyes: Blind ENMT: Mucous membranes are moist. Posterior pharynx clear of any exudate or lesions.Normal dentition.  Neck: normal, supple, no masses, no thyromegaly Respiratory: clear to auscultation bilaterally, no wheezing, no crackles. Normal respiratory effort. No accessory muscle use.  Cardiovascular: Regular rate and rhythm, no murmurs / rubs / gallops. No extremity edema. 2+ pedal pulses. No carotid bruits.  Abdomen: no tenderness, no masses palpated. No hepatosplenomegaly. Bowel sounds positive.  Musculoskeletal: no clubbing / cyanosis. No joint deformity upper and lower extremities. Good ROM, no contractures. Normal muscle tone.  Skin: no rashes, lesions, ulcers. No induration Neurologic: CN 2-12 grossly intact. Sensation intact, DTR normal. Strength 5/5 in all 4.  Psychiatric: Normal judgment and insight. Alert and oriented x 3. Normal mood.   Data Reviewed:    MRI brain: IMPRESSION: 1. 3 mm acute ischemic nonhemorrhagic infarct involving the posterior left frontal lobe. 2. Innumerable scattered chronic micro hemorrhages involving the left greater than right cerebral hemispheres as well as the cerebellum.  Findings are nonspecific, but suspected to reflect changes of cerebral amyloid angiopathy. 3. Moderately advanced chronic microvascular ischemic disease with a few scattered small remote bilateral cerebellar infarcts.  Assessment and Plan: * Acute ischemic stroke (HCC) 65m acute stroke on MRI, wondering if she had a much larger TIA though that was responsible for her transient symptoms? Stroke pathway Tele monitor Neuro consult 2d echo FLP, A1C Continue home plavix for the moment (apparently intolerant to ASA) Defer vascular imaging to neurology.  Legally blind Chronic B blindness  Hyperlipidemia FLP pending Continue lipitor  Essential hypertension Hold home BP meds in setting of acute stroke.      Advance Care Planning:   Code Status: DNR  Consults: Neurology  Family Communication: No family in room  Severity of Illness: The appropriate patient status for this patient is OBSERVATION. Observation status is judged to be reasonable and necessary in order to provide the required intensity of service to ensure the patient's safety. The patient's presenting symptoms, physical exam findings, and initial radiographic and laboratory data in the context of their medical condition is felt to place them at decreased risk for further clinical deterioration. Furthermore, it is anticipated that the patient will be medically stable for discharge from the hospital within 2 midnights of admission.   Author: GEtta Quill, DO 09/12/2022 5:02 AM  For on call review [email or web address]

## 2022-09-12 NOTE — ED Notes (Addendum)
Patient repositioned and provided full linen change call bell in reach monitor is on and cycling. Patient reports that she already ate. Despite this RN's education, the patient is refusing to eat at this time. Dr [place] notified via secure chat of patient's refusal to eat dinner

## 2022-09-13 ENCOUNTER — Other Ambulatory Visit (HOSPITAL_COMMUNITY): Payer: Medicare Other

## 2022-09-13 DIAGNOSIS — I480 Paroxysmal atrial fibrillation: Secondary | ICD-10-CM | POA: Diagnosis present

## 2022-09-13 DIAGNOSIS — H409 Unspecified glaucoma: Secondary | ICD-10-CM | POA: Diagnosis not present

## 2022-09-13 DIAGNOSIS — H401113 Primary open-angle glaucoma, right eye, severe stage: Secondary | ICD-10-CM | POA: Diagnosis not present

## 2022-09-13 DIAGNOSIS — Z66 Do not resuscitate: Secondary | ICD-10-CM | POA: Diagnosis present

## 2022-09-13 DIAGNOSIS — R29704 NIHSS score 4: Secondary | ICD-10-CM | POA: Diagnosis not present

## 2022-09-13 DIAGNOSIS — I63532 Cerebral infarction due to unspecified occlusion or stenosis of left posterior cerebral artery: Secondary | ICD-10-CM | POA: Diagnosis present

## 2022-09-13 DIAGNOSIS — I517 Cardiomegaly: Secondary | ICD-10-CM | POA: Diagnosis not present

## 2022-09-13 DIAGNOSIS — Z8249 Family history of ischemic heart disease and other diseases of the circulatory system: Secondary | ICD-10-CM | POA: Diagnosis not present

## 2022-09-13 DIAGNOSIS — I6523 Occlusion and stenosis of bilateral carotid arteries: Secondary | ICD-10-CM | POA: Diagnosis present

## 2022-09-13 DIAGNOSIS — Z823 Family history of stroke: Secondary | ICD-10-CM | POA: Diagnosis not present

## 2022-09-13 DIAGNOSIS — I739 Peripheral vascular disease, unspecified: Secondary | ICD-10-CM | POA: Diagnosis present

## 2022-09-13 DIAGNOSIS — M6281 Muscle weakness (generalized): Secondary | ICD-10-CM | POA: Diagnosis not present

## 2022-09-13 DIAGNOSIS — J45998 Other asthma: Secondary | ICD-10-CM | POA: Diagnosis not present

## 2022-09-13 DIAGNOSIS — I4891 Unspecified atrial fibrillation: Secondary | ICD-10-CM | POA: Diagnosis not present

## 2022-09-13 DIAGNOSIS — F03911 Unspecified dementia, unspecified severity, with agitation: Secondary | ICD-10-CM | POA: Diagnosis present

## 2022-09-13 DIAGNOSIS — K649 Unspecified hemorrhoids: Secondary | ICD-10-CM | POA: Diagnosis not present

## 2022-09-13 DIAGNOSIS — F419 Anxiety disorder, unspecified: Secondary | ICD-10-CM | POA: Diagnosis present

## 2022-09-13 DIAGNOSIS — J45909 Unspecified asthma, uncomplicated: Secondary | ICD-10-CM | POA: Diagnosis present

## 2022-09-13 DIAGNOSIS — I639 Cerebral infarction, unspecified: Secondary | ICD-10-CM | POA: Diagnosis present

## 2022-09-13 DIAGNOSIS — F05 Delirium due to known physiological condition: Secondary | ICD-10-CM | POA: Diagnosis not present

## 2022-09-13 DIAGNOSIS — E46 Unspecified protein-calorie malnutrition: Secondary | ICD-10-CM | POA: Diagnosis not present

## 2022-09-13 DIAGNOSIS — J302 Other seasonal allergic rhinitis: Secondary | ICD-10-CM | POA: Diagnosis not present

## 2022-09-13 DIAGNOSIS — H40213 Acute angle-closure glaucoma, bilateral: Secondary | ICD-10-CM | POA: Diagnosis present

## 2022-09-13 DIAGNOSIS — G459 Transient cerebral ischemic attack, unspecified: Secondary | ICD-10-CM | POA: Diagnosis not present

## 2022-09-13 DIAGNOSIS — Z8673 Personal history of transient ischemic attack (TIA), and cerebral infarction without residual deficits: Secondary | ICD-10-CM | POA: Diagnosis not present

## 2022-09-13 DIAGNOSIS — M1909 Primary osteoarthritis, other specified site: Secondary | ICD-10-CM | POA: Diagnosis not present

## 2022-09-13 DIAGNOSIS — J309 Allergic rhinitis, unspecified: Secondary | ICD-10-CM | POA: Diagnosis not present

## 2022-09-13 DIAGNOSIS — E8809 Other disorders of plasma-protein metabolism, not elsewhere classified: Secondary | ICD-10-CM | POA: Diagnosis not present

## 2022-09-13 DIAGNOSIS — H548 Legal blindness, as defined in USA: Secondary | ICD-10-CM | POA: Diagnosis present

## 2022-09-13 DIAGNOSIS — Z79899 Other long term (current) drug therapy: Secondary | ICD-10-CM | POA: Diagnosis not present

## 2022-09-13 DIAGNOSIS — G5793 Unspecified mononeuropathy of bilateral lower limbs: Secondary | ICD-10-CM | POA: Diagnosis not present

## 2022-09-13 DIAGNOSIS — Z7401 Bed confinement status: Secondary | ICD-10-CM | POA: Diagnosis not present

## 2022-09-13 DIAGNOSIS — R29701 NIHSS score 1: Secondary | ICD-10-CM | POA: Diagnosis not present

## 2022-09-13 DIAGNOSIS — K5900 Constipation, unspecified: Secondary | ICD-10-CM | POA: Diagnosis not present

## 2022-09-13 DIAGNOSIS — I4892 Unspecified atrial flutter: Secondary | ICD-10-CM | POA: Diagnosis present

## 2022-09-13 DIAGNOSIS — R443 Hallucinations, unspecified: Secondary | ICD-10-CM | POA: Diagnosis not present

## 2022-09-13 DIAGNOSIS — I69920 Aphasia following unspecified cerebrovascular disease: Secondary | ICD-10-CM | POA: Diagnosis not present

## 2022-09-13 DIAGNOSIS — I10 Essential (primary) hypertension: Secondary | ICD-10-CM | POA: Diagnosis present

## 2022-09-13 DIAGNOSIS — K219 Gastro-esophageal reflux disease without esophagitis: Secondary | ICD-10-CM | POA: Diagnosis present

## 2022-09-13 DIAGNOSIS — H3530 Unspecified macular degeneration: Secondary | ICD-10-CM | POA: Diagnosis present

## 2022-09-13 DIAGNOSIS — R29700 NIHSS score 0: Secondary | ICD-10-CM | POA: Diagnosis present

## 2022-09-13 DIAGNOSIS — Z90710 Acquired absence of both cervix and uterus: Secondary | ICD-10-CM | POA: Diagnosis not present

## 2022-09-13 DIAGNOSIS — E785 Hyperlipidemia, unspecified: Secondary | ICD-10-CM | POA: Diagnosis present

## 2022-09-13 DIAGNOSIS — H540X33 Blindness right eye category 3, blindness left eye category 3: Secondary | ICD-10-CM | POA: Diagnosis not present

## 2022-09-13 LAB — TSH: TSH: 1.793 u[IU]/mL (ref 0.350–4.500)

## 2022-09-13 MED ORDER — METOPROLOL TARTRATE 12.5 MG HALF TABLET
12.5000 mg | ORAL_TABLET | Freq: Two times a day (BID) | ORAL | Status: DC
  Administered 2022-09-13 – 2022-09-17 (×8): 12.5 mg via ORAL
  Filled 2022-09-13 (×10): qty 1

## 2022-09-13 MED ORDER — MELATONIN 3 MG PO TABS
3.0000 mg | ORAL_TABLET | Freq: Every day | ORAL | Status: DC
  Administered 2022-09-13 – 2022-09-16 (×4): 3 mg via ORAL
  Filled 2022-09-13 (×4): qty 1

## 2022-09-13 NOTE — Evaluation (Signed)
Speech Language Pathology Evaluation Patient Details Name: [name] [name] MRN: [id number] DOB: Jan 20, 1924 Today's Date: 09/13/2022 Time: 3810-1751 SLP Time Calculation (min) (ACUTE ONLY): 20 min  Problem List:  Patient Active Problem List   Diagnosis Date Noted   Acute ischemic stroke ([place]) 09/12/2022   Legally blind 09/12/2022   Allergic rhinitis 05/08/2021   Mild persistent asthma, uncomplicated 02/58/5277   Vasomotor rhinitis 05/08/2021   Cornea edema 09/12/2020   Pseudophakia of both eyes 09/12/2020   Leg cramps 05/27/2019   Bilateral primary osteoarthritis of knee 05/27/2019   Macular degeneration of left eye 02/12/2018   Episode of visual loss of left eye 09/03/2017   Primary open angle glaucoma (POAG) of right eye, severe stage 09/03/2017   Bradycardia, drug induced 06/11/2016   Hyperlipidemia 02/06/2016   Headache 12/14/2015   Cerebral infarction due to embolism of cerebral artery (HCC)    Acute CVA (cerebrovascular accident) ([place]) 12/09/2015   Dizziness 12/09/2015   CVA (cerebral infarction) 12/09/2015   Asthma, mild intermittent 04/04/2014   Gastroesophageal reflux disease without esophagitis 04/04/2014   Neuropathy of both feet 04/04/2014   Diuretic-induced hypokalemia 01/03/2014   Olfactory hallucinations 11/23/2013   SOB (shortness of breath) 10/26/2013   Edema of extremities 10/26/2013   Essential hypertension    Osteoarthritis    Seasonal allergies    Glaucoma    PVD (peripheral vascular disease) ([place])    Anxiety    Past Medical History:  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   Edema extremities    GERD (gastroesophageal reflux disease)    Glaucoma    Macular degeneration    Per patient    Osteoarthritis    PVD (peripheral vascular disease) ([place])    Seasonal allergies    Stroke [facility])    Past Surgical History:  Past Surgical History:  Procedure  Laterality Date   ABDOMINAL HYSTERECTOMY  1981   TONSILLECTOMY  1953   HPI:  [name] [name] is a 88 y.o. 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     Pt brought to ED on 09/11/22 with BLE weakness and leaning to right and episode of decreased responsiveness.     Per patient's son, patient was fine earlier in the day and they sat down to eat dinner.  After they were done eating dinner, he noticed that patient was sleepy and not cooperative and hard to get her to pay attention.  She was slumped over to the right side in the chair and they had a hard time waking her up.  They were finally able to get her up to the bedroom but noted that she would not stand up and not use her legs.  Son reported the patient had a similar episode 6 months ago probably.  Denies any new medication, patient has been eating and drinking fine over the last few days, no shaking and no stiffness concerning for seizure.  Patient does not have a prior history of seizures.  Patient does have a history of mini stroke with no residual symptoms; MRI on 09/12/22  3 mm acute ischemic nonhemorrhagic infarct involving the  posterior left frontal lobe.  2. Innumerable scattered chronic micro hemorrhages involving the  left greater than right cerebral hemispheres as well as the  cerebellum. Findings are nonspecific, but suspected to reflect  changes of cerebral amyloid angiopathy.  3. Moderately advanced chronic microvascular ischemic disease with a  few scattered small remote bilateral cerebellar infarcts. SLE generated.   Assessment / Plan / Recommendation Clinical Impression  Pt seen for speech/language cognitive assessment with no family members present to determine PLOF.  Pt exhibited intelligible speech within simple conversation, but periods of anomia present paired with nonsensical utterances/confusion with pt stating "I am up in the air" as an example of language of confusion.  Pt was able  to answer orientation questions accurately related to self/situation, but not time/date.  Pt answered personal information questions and followed simple commands with 80% accuracy, but this decreased with 2+ step directives and when cognitive tasks increased in complexity.  Baseline unknown at this time, but overall attention/memory recall and possibly storage affected.  ST will f/u for cog/linguistic deficits in acute setting. Thank you for this consult.    SLP Assessment  SLP Recommendation/Assessment: Patient needs continued Speech Lanaguage Pathology Services SLP Visit Diagnosis: Aphasia (R47.01);Cognitive communication deficit (R41.841);Attention and concentration deficit Attention and concentration deficit following: Cerebral infarction    Recommendations for follow up therapy are one component of a multi-disciplinary discharge planning process, led by the attending physician.  Recommendations may be updated based on patient status, additional functional criteria and insurance authorization.    Follow Up Recommendations  Follow physician's recommendations for discharge plan and follow up therapies    Assistance Recommended at Discharge  Frequent or constant Supervision/Assistance  Functional Status Assessment Patient has had a recent decline in their functional status and demonstrates the ability to make significant improvements in function in a reasonable and predictable amount of time.  Frequency and Duration min 2x/week  1 week      SLP Evaluation Cognition  Overall Cognitive Status: No family/caregiver present to determine baseline cognitive functioning Arousal/Alertness: Awake/alert Orientation Level: Oriented to person;Oriented to situation;Disoriented to time;Disoriented to place Year: 2023 Month: November Day of Week: Incorrect Attention: Sustained Sustained Attention: Impaired Sustained Attention Impairment: Verbal basic;Functional basic Memory: Impaired Memory  Impairment: Retrieval deficit;Decreased recall of new information;Decreased short term memory;Other (comment) (DTA) Decreased Short Term Memory: Verbal basic;Functional basic Awareness: Impaired Behaviors: Perseveration;Confabulation;Physical agitation       Comprehension  Auditory Comprehension Overall Auditory Comprehension: Impaired Yes/No Questions: Not tested Commands: Impaired Two Step Basic Commands: 25-49% accurate Conversation: Simple Other Conversation Comments: Answered certain questions accurately and then demonstrated language of confusion/nonsensical comments Interfering Components: Attention;Working memory EffectiveTechniques: Repetition Retail banker: Not tested (Pt with B blindness) Reading Comprehension Reading Status: Unable to assess (comment) (Pt with B blindness)    Expression Expression Primary Mode of Expression: Verbal Verbal Expression Overall Verbal Expression: Impaired Level of Generative/Spontaneous Verbalization: Sentence Repetition: Impaired Level of Impairment: Phrase level Naming: Impairment (within conversation intermittently) Responsive: Other (comment) Confrontation: Impaired Convergent: Other (comment) (affected by visual deficits/anomia) Other Naming Comments: intermittent anomia vs memory deficits Verbal Errors: Language of confusion;Perseveration;Neologisms;Confabulation Pragmatics: Unable to assess Non-Verbal Means of Communication: Not applicable Written Expression Dominant Hand: Right Written Expression: Not tested   Oral / Motor  Oral Motor/Sensory Function Overall Oral Motor/Sensory Function: Other (comment) (DTA d/t pt mentation; inattentive to directives) Motor Speech Overall Motor Speech: Appears within functional limits for tasks assessed Respiration: Within functional limits Phonation: Low vocal intensity Resonance: Within functional limits Articulation: Within functional  limitis Intelligibility: Intelligible Motor Planning: Witnin functional limits Motor Speech Errors: Not applicable Interfering Components: Premorbid status            Elvina Sidle, M.S., CCC-SLP 09/13/2022, 12:01 PM

## 2022-09-13 NOTE — Progress Notes (Signed)
Transcranial Doppler test was attempted. Unable to obtain vessels do to technical difficulties from absent diagnostic windows.  09/13/2022  7:53 AM Olamide Carattini, Bonnye Fava

## 2022-09-13 NOTE — Progress Notes (Signed)
PROGRESS NOTE    [name] [name]  JJH:[id number] DOB: 1923/11/12 DOA: 09/11/2022 PCP: Sandrea Hughs, NP    Brief Narrative:   [name] [name] is a 88 y.o. 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  Patient reports onset after eating dinner and family report that she has been less responsive and lethargic.  Last known normal was at [facility].  Per patient's son, patient was doing fine earlier in the day and following dinner; he noticed that the patient was sleepy and not cooperative.  She was then noted to be slumped over in the chair on the right side and had a hard time waking her up.  Patient then was attempted to ambulate to her bedroom but had much difficulty.  Son reports similar episode 6 months ago and denies any new medications.  Also her appetite has been fine over the last few days.  No seizure activity reported.  In the ED, temperature 98.3 F, HR 73, RR 16, BP 145/69, SpO2 100% on room air.  WBC 6.4, hemoglobin 11.6, platelets 187.  Sodium 138, potassium 4.3, chloride 106, CO2 24, glucose 133, BUN 18, creatinine 1.04.  AST 26, ALT 23, total bilirubin 0.8.  High sensitive troponin 7> 5.  TSH 2.366.  Urinalysis negative.  EtOH level less than 10.  Chest x-ray with cardiomegaly with mildly distended pulmonary vasculature.  CT head without contrast with areas of low density in the high right frontoparietal region concerning for acute versus subacute infarct; atrophy, chronic small vessel disease.  Neurology was consulted.  [facility] consulted for admission for further evaluation management of suspected acute CVA.  Assessment & Plan:    Acute/subacute CVA right frontal parietal region Patient presenting to ED via EMS with weakness, lethargy and confusion.  Patient is afebrile without leukocytosis.  CT head with low-density high right frontoparietal region concerning for  acute versus subacute infarct.  MRI brain notable for 3 mm acute ischemic nonhemorrhagic infarct posterior left frontal lobe and innumerable scattered chronic micro hemorrhages involving the left greater than right cerebral hemispheres as well as the cerebellum.  TTE with LVEF 45-50%, LV mildly decreased function, LV with no regional wall motion normalities, mild concentric LVH, trivial MR, IVC normal in size.  TSH 2.366.  B12 698.  Carotid duplex ultrasound with no significant stenosis.  Neurology now signed off. -- Neurology following, appreciate assistance -- LDL: 28 -- Hemoglobin A1c: 5.4 -- PT recommends SNF placement, awaiting further OT evaluation -- Plavix 75 mg p.o. daily -- Atorvastatin 10 mg p.o. daily -- Allow permissive hypertension to 220/120 in the setting of acute CVA -- Monitor on telemetry  Essential hypertension On amlodipine outpatient, holding to allow permissive hypertension in the setting of acute CVA. -- Start metoprolol tartrate 12.5 mg p.o. twice daily -- Hydralazine '10mg'$  IV q6h PRN SBP >220 or DBP >120  A-fib/A-flutter, transient; no previous history Overnight, patient was noted to be tachycardic with EKG concerning for a flutter versus fibrillation.  Patient is not a long-term anticoagulation candidate given her high fall risk with legal blindness. -- Starting metoprolol tartrate 12.5 mg p.o. twice daily -- Continue monitor on telemetry  Hyperlipidemia Total cholesterol 88, HDL 48. -- Home atorvastatin 10 mg p.o. daily -- Fasting LDL: Pending  Blindness 2/2 ARMD/glaucoma -- Supportive care  Delirium --Get up during the day --Encourage a familiar face to remain present throughout  the day --Keep blinds open and lights on during daylight hours --Minimize the use of opioids/benzodiazepines --Melatonin 3 mg p.o. nightly  DVT prophylaxis: SCD's Start: 09/12/22 0314    Code Status: DNR Family Communication: No family present at bedside this morning; updated  patient's son [name] [name] via telephone this afternoon  Disposition Plan:  Level of care: Telemetry Medical Status is: Observation The patient will require care spanning > 2 midnights and should be moved to inpatient because: Acute stroke, needs further monitoring on telemetry with anticipation of need for SNF placement    Consultants:  Neurology  Procedures:  TTE Carotid artery ultrasound: Pending Transcranial Doppler: Pending  Antimicrobials:  None   Subjective: Patient seen examined bedside, resting comfortably.  No family present.  Overnight patient became more confused and agitated likely secondary to her advanced age, hospital-acquired delirium complicated by her chronic blindness.  Patient appears to be stable and calm currently.  Reports that she feels hungry.  Seen by PT yesterday with recommendation of SNF placement.  Neurology signed off today.  No other specific questions or concerns at this time.  Denies headache, no chest pain, no palpitations, no shortness of breath, no abdominal pain, no fever/chills/night sweats, no nausea/vomiting/diarrhea, no paresthesias.  No other acute events overnight per nursing staff.    Objective: Vitals:   09/13/22 0446 09/13/22 0851 09/13/22 0911 09/13/22 1100  BP: (!) 164/83  134/82   Pulse:  95 81 84  Resp: '18 20 20 18  '$ Temp:  98.2 F (36.8 C) 98.2 F (36.8 C) 97.6 F (36.4 C)  TempSrc:  Axillary Axillary Oral  SpO2:   100%   Weight:      Height:        Intake/Output Summary (Last 24 hours) at 09/13/2022 1334 Last data filed at 09/12/2022 1516 Gross per 24 hour  Intake --  Output 200 ml  Net -200 ml   Filed Weights   09/11/22 2216  Weight: 63.5 kg    Examination:  Physical Exam: GEN: NAD, alert and oriented x 3, thin/elderly in appearance HEENT: NCAT, PERRL, EOMI, sclera clear, MMM PULM: CTAB w/o wheezes/crackles, normal respiratory effort, on room air CV: RRR w/o M/G/R GI: abd soft, NTND, NABS, no R/G/M MSK: Trace lower  extremity peripheral edema, muscle strength globally intact 5/5 bilateral upper/lower extremities NEURO: CN II-XII intact other than blindness which is chronic, no focal deficits, sensation to light touch intact PSYCH: normal mood/affect Integumentary: dry/intact, no rashes or wounds    Data Reviewed: I have personally reviewed following labs and imaging studies  CBC: Recent Labs  Lab 09/11/22 2204 09/11/22 2208  WBC 6.4  --   NEUTROABS 3.5  --   HGB 11.6* 12.2  HCT 33.6* 36.0  MCV 91.6  --   PLT 187  --    Basic Metabolic Panel: Recent Labs  Lab 09/11/22 2204 09/11/22 2208  NA 138 136  K 4.3 4.5  CL 106 105  CO2 24  --   GLUCOSE 133* 131*  BUN 18 21  CREATININE 1.04* 1.10*  CALCIUM 8.9  --    GFR: Estimated Creatinine Clearance: 27.5 mL/min (A) (by C-G formula based on SCr of 1.1 mg/dL (H)). Liver Function Tests: Recent Labs  Lab 09/11/22 2204  AST 26  ALT 23  ALKPHOS 67  BILITOT 0.8  PROT 6.3*  ALBUMIN 3.3*   No results for input(s): "LIPASE", "AMYLASE" in the last 168 hours. Recent Labs  Lab 09/12/22 2232  AMMONIA <10   Coagulation  Profile: Recent Labs  Lab 09/11/22 2204  INR 1.1   Cardiac Enzymes: No results for input(s): "CKTOTAL", "CKMB", "CKMBINDEX", "TROPONINI" in the last 168 hours. BNP (last 3 results) No results for input(s): "PROBNP" in the last 8760 hours. HbA1C: Recent Labs    09/12/22 0335  HGBA1C 5.4   CBG: No results for input(s): "GLUCAP" in the last 168 hours. Lipid Profile: Recent Labs    09/12/22 0610 09/12/22 2232  CHOL 88  --   HDL 48  --   LDLCALC NOT CALCULATED  --   TRIG <10  --   CHOLHDL 1.8  --   LDLDIRECT  --  28   Thyroid Function Tests: Recent Labs    09/13/22 0413  TSH 1.793   Anemia Panel: Recent Labs    09/11/22 2220 09/11/22 2304  VITAMINB12  --  698  FOLATE >40.0  --    Sepsis Labs: No results for input(s): "PROCALCITON", "LATICACIDVEN" in the last 168 hours.  Recent Results (from  the past 240 hour(s))  Urine Culture     Status: None   Collection Time: 09/11/22 11:04 PM   Specimen: Urine, Clean Catch  Result Value Ref Range Status   Specimen Description URINE, CLEAN CATCH  Final   Special Requests NONE  Final   Culture   Final    NO GROWTH Performed at [facility], [address], [place], [place] [address]    Report Status 09/12/2022 FINAL  Final         Radiology Studies: VAS US CAROTID  Result Date: 09/13/2022 Carotid Arterial Duplex Study Patient Name:  [name] [name]  Date of Exam:   09/12/2022 Medical Rec #: [id number]       Accession #:    [id number] Date of Birth: 03-28-1924      Patient Gender: F Patient Age:   62 years Exam Location:  [facility] Procedure:      VAS US CAROTID Referring Phys: Beulah Gandy --------------------------------------------------------------------------------  Indications:       CVA. Risk Factors:      PAD. Other Factors:     Legally blind bilaterally, GERD. Comparison Study:  09/23/2017 - 1-39% bilaterally Performing Technologist: Oda Cogan RDMS, RVT  Examination Guidelines: A complete evaluation includes B-mode imaging, spectral Doppler, color Doppler, and power Doppler as needed of all accessible portions of each vessel. Bilateral testing is considered an integral part of a complete examination. Limited examinations for reoccurring indications may be performed as noted.  Right Carotid Findings: +----------+--------+--------+--------+------------------+------------------+           PSV cm/sEDV cm/sStenosisPlaque DescriptionComments           +----------+--------+--------+--------+------------------+------------------+ CCA Prox  80      12                                                   +----------+--------+--------+--------+------------------+------------------+ CCA Distal52      13                                intimal thickening  +----------+--------+--------+--------+------------------+------------------+ ICA Prox  43      12      1-39%                     intimal thickening +----------+--------+--------+--------+------------------+------------------+  ICA Mid   43      14                                                   +----------+--------+--------+--------+------------------+------------------+ ICA Distal110     20                                tortuous           +----------+--------+--------+--------+------------------+------------------+ ECA       62      10                                                   +----------+--------+--------+--------+------------------+------------------+ +----------+--------+-------+----------------+-------------------+           PSV cm/sEDV cmsDescribe        Arm Pressure (mmHG) +----------+--------+-------+----------------+-------------------+ [id number]            Multiphasic, WNL                    +----------+--------+-------+----------------+-------------------+ +---------+--------+--+--------+-+---------+ VertebralPSV cm/s61EDV cm/s8Antegrade +---------+--------+--+--------+-+---------+  Left Carotid Findings: +----------+--------+--------+--------+------------------+------------------+           PSV cm/sEDV cm/sStenosisPlaque DescriptionComments           +----------+--------+--------+--------+------------------+------------------+ CCA Prox  73      12                                                   +----------+--------+--------+--------+------------------+------------------+ CCA Distal64      16                                intimal thickening +----------+--------+--------+--------+------------------+------------------+ ICA Prox  40      12      1-39%   focal and calcific                   +----------+--------+--------+--------+------------------+------------------+ ICA Mid   48      14                                                    +----------+--------+--------+--------+------------------+------------------+ ICA Distal84      16                                tortuous           +----------+--------+--------+--------+------------------+------------------+ ECA       107     15                                                   +----------+--------+--------+--------+------------------+------------------+ +----------+--------+--------+----------------+-------------------+  PSV cm/sEDV cm/sDescribe        Arm Pressure (mmHG) +----------+--------+--------+----------------+-------------------+ Subclavian119             Multiphasic, WNL                    +----------+--------+--------+----------------+-------------------+ +---------+--------+--+--------+--+---------+ VertebralPSV cm/s68EDV cm/s13Antegrade +---------+--------+--+--------+--+---------+   Summary: Right Carotid: Velocities in the right ICA are consistent with a 1-39% stenosis. Left Carotid: Velocities in the left ICA are consistent with a 1-39% stenosis. Vertebrals:  Bilateral vertebral arteries demonstrate antegrade flow. Subclavians: Normal flow hemodynamics were seen in bilateral subclavian              arteries. *See table(s) above for measurements and observations.  Electronically signed by Antony Contras MD on 09/13/2022 at 12:41:39 PM.    Final    ECHOCARDIOGRAM COMPLETE  Result Date: 09/12/2022    ECHOCARDIOGRAM REPORT   Patient Name:   [name] [name] Date of Exam: 09/12/2022 Medical Rec #:  [id number]      Height:       65.0 in Accession #:    [id number]     Weight:       140.0 lb Date of Birth:  09/08/1924     BSA:          1.700 m Patient Age:    89 years       BP:           208/183 mmHg Patient Gender: F              HR:           71 bpm. Exam Location:  Inpatient Procedure: 2D Echo, Color Doppler and Cardiac Doppler Indications:    Stroke i63.9  History:        Patient has prior history of Echocardiogram  examinations, most                 recent 12/10/2015. Risk Factors:Hypertension and Dyslipidemia.  Sonographer:    Raquel Sarna Senior RDCS Referring Phys: Tracy  1. Left ventricular ejection fraction, by estimation, is 45 to 50%. The left ventricle has mildly decreased function. The left ventricle has no regional wall motion abnormalities. There is mild concentric left ventricular hypertrophy. Left ventricular diastolic parameters are indeterminate.  2. Right ventricular systolic function is normal. The right ventricular size is mildly enlarged.  3. The mitral valve is normal in structure. Trivial mitral valve regurgitation. No evidence of mitral stenosis.  4. The aortic valve is tricuspid. Aortic valve regurgitation is not visualized. Aortic valve sclerosis is present, with no evidence of aortic valve stenosis.  5. The inferior vena cava is normal in size with greater than 50% respiratory variability, suggesting right atrial pressure of 3 mmHg. FINDINGS  Left Ventricle: Left ventricular ejection fraction, by estimation, is 45 to 50%. The left ventricle has mildly decreased function. The left ventricle has no regional wall motion abnormalities. The left ventricular internal cavity size was normal in size. There is mild concentric left ventricular hypertrophy. Left ventricular diastolic parameters are indeterminate. Right Ventricle: The right ventricular size is mildly enlarged. No increase in right ventricular wall thickness. Right ventricular systolic function is normal. Left Atrium: Left atrial size was normal in size. Right Atrium: Right atrial size was normal in size. Pericardium: There is no evidence of pericardial effusion. Presence of epicardial fat layer. Mitral Valve: The mitral valve is normal in structure. Mild mitral annular calcification. Trivial mitral valve regurgitation. No evidence of mitral  valve stenosis. Tricuspid Valve: The tricuspid valve is normal in structure. Tricuspid  valve regurgitation is not demonstrated. No evidence of tricuspid stenosis. Aortic Valve: The aortic valve is tricuspid. Aortic valve regurgitation is not visualized. Aortic valve sclerosis is present, with no evidence of aortic valve stenosis. Pulmonic Valve: The pulmonic valve was normal in structure. Pulmonic valve regurgitation is trivial. No evidence of pulmonic stenosis. Aorta: The aortic root is normal in size and structure. Venous: The inferior vena cava is normal in size with greater than 50% respiratory variability, suggesting right atrial pressure of 3 mmHg. IAS/Shunts: No atrial level shunt detected by color flow Doppler.  LEFT VENTRICLE PLAX 2D LVIDd:         3.30 cm   Diastology LVIDs:         2.10 cm   LV e' medial:    7.51 cm/s LV PW:         1.10 cm   LV E/e' medial:  12.0 LV IVS:        1.00 cm   LV e' lateral:   7.07 cm/s LVOT diam:     1.80 cm   LV E/e' lateral: 12.7 LV SV:         50 LV SV Index:   29 LVOT Area:     2.54 cm  RIGHT VENTRICLE RV S prime:     11.70 cm/s TAPSE (M-mode): 2.5 cm LEFT ATRIUM             Index        RIGHT ATRIUM           Index LA diam:        3.20 cm 1.88 cm/m   RA Area:     18.40 cm LA Vol (A2C):   38.3 ml 22.53 ml/m  RA Volume:   44.50 ml  26.18 ml/m LA Vol (A4C):   51.6 ml 30.35 ml/m LA Biplane Vol: 45.0 ml 26.47 ml/m  AORTIC VALVE LVOT Vmax:   85.50 cm/s LVOT Vmean:  63.100 cm/s LVOT VTI:    0.197 m  AORTA Ao Root diam: 2.80 cm Ao Asc diam:  3.30 cm MITRAL VALVE MV Area (PHT): 3.97 cm     SHUNTS MV Decel Time: 191 msec     Systemic VTI:  0.20 m MV E velocity: 90.00 cm/s   Systemic Diam: 1.80 cm MV A velocity: 104.00 cm/s MV E/A ratio:  0.87 Kardie Tobb DO Electronically signed by Berniece Salines DO Signature Date/Time: 09/12/2022/11:00:21 AM    Final    CT HEAD WO CONTRAST (5MM)  Result Date: 09/12/2022 CLINICAL DATA:  Mental status change, unknown cause EXAM: CT HEAD WITHOUT CONTRAST TECHNIQUE: Contiguous axial images were obtained from the base of the  skull through the vertex without intravenous contrast. RADIATION DOSE REDUCTION: This exam was performed according to the departmental dose-optimization program which includes automated exposure control, adjustment of the mA and/or kV according to patient size and/or use of iterative reconstruction technique. COMPARISON:  03/11/2022 FINDINGS: Brain: There is atrophy and chronic small vessel disease changes. Areas of low-density noted in the high right frontoparietal region, new since June concerning for acute to subacute infarct. No hemorrhage or hydrocephalus. No midline shift. Vascular: No hyperdense vessel or unexpected calcification. Skull: No acute calvarial abnormality. Sinuses/Orbits: No acute findings Other: None IMPRESSION: Areas of low-density in the high right frontoparietal region concerning for acute or subacute infarct. This could be further evaluated with MRI as clinically indicated. Atrophy, chronic small vessel  disease. Electronically Signed   By: Rolm Baptise M.D.   On: 09/12/2022 01:10   MR BRAIN WO CONTRAST  Result Date: 09/12/2022 CLINICAL DATA:  Initial evaluation for mental status change, unknown cause. EXAM: MRI HEAD WITHOUT CONTRAST TECHNIQUE: Multiplanar, multiecho pulse sequences of the brain and surrounding structures were obtained without intravenous contrast. COMPARISON:  Prior CT from 03/11/2022. FINDINGS: Brain: Cerebral volume within normal limits for age. Patchy T2/FLAIR hyperintensity involving the periventricular deep white matter both cerebral hemispheres as well as the pons, most consistent with chronic small vessel ischemic disease, moderately advanced in nature. Few scattered small remote bilateral cerebellar infarcts noted. 3 mm focus of restricted diffusion involving the posterior left frontal lobe, consistent with a punctate acute ischemic infarct (series 5, image 78). No associated hemorrhage or mass effect. No other convincing evidence for acute or subacute ischemia  elsewhere within the brain. No acute intracranial hemorrhage. Innumerable scattered chronic micro hemorrhages noted involving the left greater than right cerebral hemispheres as well as the cerebellum. 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 Findings are nonspecific, but suspected to reflect changes of cerebral amyloid angiopathy. No mass lesion, midline shift or mass effect. No hydrocephalus or extra-axial fluid collection. Pituitary gland and suprasellar region within normal limits. Vascular: Major intracranial vascular flow voids are maintained. Skull and upper cervical spine: Craniocervical junction normal. Bone marrow signal intensity within normal limits. No scalp soft tissue abnormality. Sinuses/Orbits: Patient status post bilateral ocular lens replacement. Paranasal sinuses are largely clear. No mastoid effusion. Other: None. IMPRESSION: 1. 3 mm acute ischemic nonhemorrhagic infarct involving the posterior left frontal lobe. 2. Innumerable scattered chronic micro hemorrhages involving the left greater than right cerebral hemispheres as well as the cerebellum. Findings are nonspecific, but suspected to reflect changes of cerebral amyloid angiopathy. 3. Moderately advanced chronic microvascular ischemic disease with a few scattered small remote bilateral cerebellar infarcts. Electronically Signed   By: Jeannine Boga M.D.   On: 09/12/2022 01:03   DG Chest Port 1 View  Result Date: 09/11/2022 CLINICAL DATA:  Altered mental status. EXAM: PORTABLE CHEST 1 VIEW COMPARISON:  03/11/2022. FINDINGS: The heart is enlarged the mediastinal contour is stable. There is atherosclerotic calcification of the aorta. The pulmonary vasculature is mildly distended. No consolidation, effusion, or pneumothorax. No acute osseous abnormality. IMPRESSION: Cardiomegaly with mildly distended pulmonary vasculature. Electronically Signed   By: Brett Fairy M.D.   On: 09/11/2022 22:36         Scheduled Meds:  atorvastatin  10 mg Oral Daily   clopidogrel  75 mg Oral Daily   metoprolol tartrate  12.5 mg Oral BID   sodium chloride flush  3 mL Intravenous Once   Continuous Infusions:   LOS: 0 days    Time spent: 52 minutes spent on chart review, discussion with nursing staff, consultants, updating family and interview/physical exam; more than 50% of that time was spent in counseling and/or coordination of care.    Melitta Tigue J [place] DO [facility] Available via Epic secure chat 7am-7pm After these hours, please refer to coverage provider listed on [email or web address] 09/13/2022, 1:34 PM

## 2022-09-13 NOTE — Progress Notes (Addendum)
STROKE TEAM PROGRESS NOTE   INTERVAL HISTORY 12/7 Patient was evaluated at bedside. She was in good spirits. She had impaired cognition and minor focal neurologic deficits, namely diminished sensation on L side. MRI shows tiny punctate left peri insular frontal infarct with CT scan done later yesterday evening shows right frontal wedge-shaped hypodensity likely compatible with acute infarct as well.  Patient has no known history of A-fib  12/8 patient hypertensive and agitated overnight. On evaluation this morning patient was calm, somnolent, but easy to rouse. Carotid doppler shows 1-39% stenosis in bilateral ICA. Echo shows EF 45 - 50% with mildly decreased LV function and mild left ventricular hypertrophy.  Transcranial Dopplers could not be completed due to poor and absent bony windows  Vitals:   09/13/22 0054 09/13/22 0116 09/13/22 0414 09/13/22 0446  BP: (!) 211/95 (!) 176/78  (!) 164/83  Pulse:   98   Resp: 20 (!) 24  18  Temp:   98.5 F (36.9 C)   TempSrc:   Oral   SpO2:   100%   Weight:      Height:       CBC:  Recent Labs  Lab 09/11/22 2204 09/11/22 2208  WBC 6.4  --   NEUTROABS 3.5  --   HGB 11.6* 12.2  HCT 33.6* 36.0  MCV 91.6  --   PLT 187  --    Basic Metabolic Panel:  Recent Labs  Lab 09/11/22 2204 09/11/22 2208  NA 138 136  K 4.3 4.5  CL 106 105  CO2 24  --   GLUCOSE 133* 131*  BUN 18 21  CREATININE 1.04* 1.10*  CALCIUM 8.9  --    Lipid Panel:  Recent Labs  Lab 09/12/22 0610  CHOL 88  TRIG <10  HDL 48  CHOLHDL 1.8  VLDL NOT CALCULATED  LDLCALC NOT CALCULATED   HgbA1c:  Recent Labs  Lab 09/12/22 0335  HGBA1C 5.4   Urine Drug Screen: No results for input(s): "LABOPIA", "COCAINSCRNUR", "LABBENZ", "AMPHETMU", "THCU", "LABBARB" in the last 168 hours.  Alcohol Level  Recent Labs  Lab 09/11/22 2204  ETH <10    IMAGING past 24 hours VAS US CAROTID  Result Date: 09/12/2022 Carotid Arterial Duplex Study Patient Name:  [name] [name]  Date  of Exam:   09/12/2022 Medical Rec #: [id number]       Accession #:    [id number] Date of Birth: 06/09/1924      Patient Gender: F Patient Age:   88 years Exam Location:  [facility] Procedure:      VAS US CAROTID Referring Phys: Beulah Gandy --------------------------------------------------------------------------------  Indications:       CVA. Risk Factors:      PAD. Other Factors:     Legally blind bilaterally, GERD. Comparison Study:  09/23/2017 - 1-39% bilaterally Performing Technologist: Oda Cogan RDMS, RVT  Examination Guidelines: A complete evaluation includes B-mode imaging, spectral Doppler, color Doppler, and power Doppler as needed of all accessible portions of each vessel. Bilateral testing is considered an integral part of a complete examination. Limited examinations for reoccurring indications may be performed as noted.  Right Carotid Findings: +----------+--------+--------+--------+------------------+------------------+           PSV cm/sEDV cm/sStenosisPlaque DescriptionComments           +----------+--------+--------+--------+------------------+------------------+ CCA Prox  80      12                                                   +----------+--------+--------+--------+------------------+------------------+  CCA Distal52      13                                intimal thickening +----------+--------+--------+--------+------------------+------------------+ ICA Prox  43      12      1-39%                     intimal thickening +----------+--------+--------+--------+------------------+------------------+ ICA Mid   43      14                                                   +----------+--------+--------+--------+------------------+------------------+ ICA Distal110     20                                tortuous           +----------+--------+--------+--------+------------------+------------------+ ECA       62      10                                                    +----------+--------+--------+--------+------------------+------------------+ +----------+--------+-------+----------------+-------------------+           PSV cm/sEDV cmsDescribe        Arm Pressure (mmHG) +----------+--------+-------+----------------+-------------------+ [id number]            Multiphasic, WNL                    +----------+--------+-------+----------------+-------------------+ +---------+--------+--+--------+-+---------+ VertebralPSV cm/s61EDV cm/s8Antegrade +---------+--------+--+--------+-+---------+  Left Carotid Findings: +----------+--------+--------+--------+------------------+------------------+           PSV cm/sEDV cm/sStenosisPlaque DescriptionComments           +----------+--------+--------+--------+------------------+------------------+ CCA Prox  73      12                                                   +----------+--------+--------+--------+------------------+------------------+ CCA Distal64      16                                intimal thickening +----------+--------+--------+--------+------------------+------------------+ ICA Prox  40      12      1-39%   focal and calcific                   +----------+--------+--------+--------+------------------+------------------+ ICA Mid   48      14                                                   +----------+--------+--------+--------+------------------+------------------+ ICA Distal84      16                                tortuous           +----------+--------+--------+--------+------------------+------------------+  ECA       107     15                                                   +----------+--------+--------+--------+------------------+------------------+ +----------+--------+--------+----------------+-------------------+           PSV cm/sEDV cm/sDescribe        Arm Pressure (mmHG)  +----------+--------+--------+----------------+-------------------+ Subclavian119             Multiphasic, WNL                    +----------+--------+--------+----------------+-------------------+ +---------+--------+--+--------+--+---------+ VertebralPSV cm/s68EDV cm/s13Antegrade +---------+--------+--+--------+--+---------+   Summary: Right Carotid: Velocities in the right ICA are consistent with a 1-39% stenosis. Left Carotid: Velocities in the left ICA are consistent with a 1-39% stenosis. Vertebrals:  Bilateral vertebral arteries demonstrate antegrade flow. Subclavians: Normal flow hemodynamics were seen in bilateral subclavian              arteries. *See table(s) above for measurements and observations.     Preliminary    ECHOCARDIOGRAM COMPLETE  Result Date: 09/12/2022    ECHOCARDIOGRAM REPORT   Patient Name:   OMELIA MARQUART Date of Exam: 09/12/2022 Medical Rec #:  [id number]      Height:       65.0 in Accession #:    [id number]     Weight:       140.0 lb Date of Birth:  January 25, 1924     BSA:          1.700 m Patient Age:    89 years       BP:           208/183 mmHg Patient Gender: F              HR:           71 bpm. Exam Location:  Inpatient Procedure: 2D Echo, Color Doppler and Cardiac Doppler Indications:    Stroke i63.9  History:        Patient has prior history of Echocardiogram examinations, most                 recent 12/10/2015. Risk Factors:Hypertension and Dyslipidemia.  Sonographer:    Raquel Sarna Senior RDCS Referring Phys: [place]  1. Left ventricular ejection fraction, by estimation, is 45 to 50%. The left ventricle has mildly decreased function. The left ventricle has no regional wall motion abnormalities. There is mild concentric left ventricular hypertrophy. Left ventricular diastolic parameters are indeterminate.  2. Right ventricular systolic function is normal. The right ventricular size is mildly enlarged.  3. The mitral valve is normal in structure.  Trivial mitral valve regurgitation. No evidence of mitral stenosis.  4. The aortic valve is tricuspid. Aortic valve regurgitation is not visualized. Aortic valve sclerosis is present, with no evidence of aortic valve stenosis.  5. The inferior vena cava is normal in size with greater than 50% respiratory variability, suggesting right atrial pressure of 3 mmHg. FINDINGS  Left Ventricle: Left ventricular ejection fraction, by estimation, is 45 to 50%. The left ventricle has mildly decreased function. The left ventricle has no regional wall motion abnormalities. The left ventricular internal cavity size was normal in size. There is mild concentric left ventricular hypertrophy. Left ventricular diastolic parameters are indeterminate. Right Ventricle: The right ventricular size is mildly enlarged.  No increase in right ventricular wall thickness. Right ventricular systolic function is normal. Left Atrium: Left atrial size was normal in size. Right Atrium: Right atrial size was normal in size. Pericardium: There is no evidence of pericardial effusion. Presence of epicardial fat layer. Mitral Valve: The mitral valve is normal in structure. Mild mitral annular calcification. Trivial mitral valve regurgitation. No evidence of mitral valve stenosis. Tricuspid Valve: The tricuspid valve is normal in structure. Tricuspid valve regurgitation is not demonstrated. No evidence of tricuspid stenosis. Aortic Valve: The aortic valve is tricuspid. Aortic valve regurgitation is not visualized. Aortic valve sclerosis is present, with no evidence of aortic valve stenosis. Pulmonic Valve: The pulmonic valve was normal in structure. Pulmonic valve regurgitation is trivial. No evidence of pulmonic stenosis. Aorta: The aortic root is normal in size and structure. Venous: The inferior vena cava is normal in size with greater than 50% respiratory variability, suggesting right atrial pressure of 3 mmHg. IAS/Shunts: No atrial level shunt detected  by color flow Doppler.  LEFT VENTRICLE PLAX 2D LVIDd:         3.30 cm   Diastology LVIDs:         2.10 cm   LV e' medial:    7.51 cm/s LV PW:         1.10 cm   LV E/e' medial:  12.0 LV IVS:        1.00 cm   LV e' lateral:   7.07 cm/s LVOT diam:     1.80 cm   LV E/e' lateral: 12.7 LV SV:         50 LV SV Index:   29 LVOT Area:     2.54 cm  RIGHT VENTRICLE RV S prime:     11.70 cm/s TAPSE (M-mode): 2.5 cm LEFT ATRIUM             Index        RIGHT ATRIUM           Index LA diam:        3.20 cm 1.88 cm/m   RA Area:     18.40 cm LA Vol (A2C):   38.3 ml 22.53 ml/m  RA Volume:   44.50 ml  26.18 ml/m LA Vol (A4C):   51.6 ml 30.35 ml/m LA Biplane Vol: 45.0 ml 26.47 ml/m  AORTIC VALVE LVOT Vmax:   85.50 cm/s LVOT Vmean:  63.100 cm/s LVOT VTI:    0.197 m  AORTA Ao Root diam: 2.80 cm Ao Asc diam:  3.30 cm MITRAL VALVE MV Area (PHT): 3.97 cm     SHUNTS MV Decel Time: 191 msec     Systemic VTI:  0.20 m MV E velocity: 90.00 cm/s   Systemic Diam: 1.80 cm MV A velocity: 104.00 cm/s MV E/A ratio:  0.87 Kardie Tobb DO Electronically signed by Berniece Salines DO Signature Date/Time: 09/12/2022/11:00:21 AM    Final     PHYSICAL EXAM General: well-appearing pleasant elderly African-American lady in no acute distress HEENT: normocephalic and atraumatic Cardiovascular: regular rate Respiratory: normal respiratory effort and on RA Gastrointestinal: non-tender and non-distended Extremities: moving all extremities spontaneously  Mental Status: CARAL WHAN is alert; she is oriented to person and oriented to situation. Speech was clear and fluent without evidence of aphasia.  Cranial Nerves: II:  Visual fields  could not be assessed - patient is legally blind bilaterally;  patient notes being able to see bright light on L eye III,IV, VI: no ptosis V,VII: smile  symmetric, facial light touch sensation diminished on R side IX,X: uvula rises symmetrically XII: midline tongue extension without atrophy and without  fasciculations  Motor: Right : Upper extremity   5/5 full power  Lower extremity   5/5 full power  Left: Upper extremity   5/5 full power Lower extremity   5/5 full power  Tone and bulk: normal tone throughout; no atrophy noted  Sensory: diminished on R side  Gait: not observed during encounter  ASSESSMENT/PLAN [name] [name] is a 88 y.o. female with PMH significant for glaucoma and and legal blindness, peripheral vascular disese, GERD who presents with acute onset altered mental status with rapid spontaneous resolution. On imaging was found to have acute / subacute R frontoparietal infarct, and on repeat imaging found to have L frontal infarct.  #Non-hemorrhagic infarct of posterior L frontal lobe #Acute/ subacute infarct of R frontoparietal region Consecutive findings in multiple areas highly suspicious for cardioembolic cause.  Likely paroxysmal A-fib Code Stroke CT head shows areas of low-density in the high right frontoparietal region concerning for acute or subacute infarct. Chronic small vessel disease present. Atrophy present. MRI shows 3 mm acute ischemic nonhemorrhagic infarct involving the posterior left frontal lobe, innumerable scattered chronic micro hemorrhages involving the left greater than right cerebral hemispheres as well as the cerebellum. Findings are nonspecific, but suspected to reflect changes of cerebral amyloid angiopathy. Moderately advanced chronic microvascular ischemic disease with a few scattered small remote bilateral cerebellar infarcts. Carotid Doppler  velocities in bilateral ICA are consistent with a 1-39% stenosis. Bilateral vertebral arteries demonstrate antegrade flow.  Normal flow hemodynamics were seen in bilateral subclavian arteries.  Transcranial Doppler could not be completed due to poor and absent bony windows 2D Echo EF 45 - 50% with mildly decreased LV function and mild left ventricular hypertrophy LDL 28 HgbA1c pending VTE prophylaxis -  SCDs    Diet   Diet Heart Room service appropriate? Yes; Fluid consistency: Thin   clopidogrel 75 mg daily prior to admission, now on clopidogrel 75 mg daily. Patient reportedly intolerant to aspirin. Continue clopidogrel 75 mg daily indefinitely  Therapy recommendations:  pending Disposition:  pending PT/OT recs  Essential HTN, chronic Home meds: amlodipine, hold for now given acute stroke Stable Permissive hypertension (OK if < 220/120) but gradually normalize in 5-7 days Long-term BP goal normotensive  Hyperlipidemia, now normalized Home meds:  atorvastatin 10 mg, resumed in hospital Fasting LDL pending, goal < 70 High intensity statin not indicated Very low LDL may be associated with cancer and hemorrhagic stroke, though this is rare. Consider discontinuing atorvastatin  Other Active Problems to be managed by primary team #Legal blindness #GERD #Seasonal allergies #Peripheral vascular disease  Hospital day # 0  I have personally obtained history,examined this patient, reviewed notes, independently viewed imaging studies, participated in medical decision making and plan of care.ROS completed by me personally and pertinent positives fully documented  I have made any additions or clarifications directly to the above note. Agree with note above.  Patient is doing better.  Continue Plavix for stroke prevention and aggressive risk factor modification.  Patient being legally blind and living at home presents a very high fall risk and she will not do prolonged cardiac monitoring to look for paroxysmal A-fib as she is not a good long-term anticoagulation candidate..  Patient is intolerant to aspirin.  Stroke team will sign off.  Kindly call for questions.  Antony Contras, MD Medical Director [facility] Pager: [phone] 09/13/2022 1:34 PM  To contact Stroke Continuity provider, please refer to [email or web address] After hours, contact General Neurology

## 2022-09-14 MED ORDER — AMLODIPINE BESYLATE 5 MG PO TABS
5.0000 mg | ORAL_TABLET | Freq: Every day | ORAL | Status: DC
  Administered 2022-09-14 – 2022-09-17 (×4): 5 mg via ORAL
  Filled 2022-09-14 (×4): qty 1

## 2022-09-14 MED ORDER — RISPERIDONE 0.5 MG PO TABS
0.5000 mg | ORAL_TABLET | Freq: Two times a day (BID) | ORAL | Status: DC
  Administered 2022-09-14 – 2022-09-17 (×7): 0.5 mg via ORAL
  Filled 2022-09-14 (×7): qty 1

## 2022-09-14 NOTE — Progress Notes (Signed)
PROGRESS NOTE    [name] [name]  KCL:[id number] DOB: 10/25/1923 DOA: 09/11/2022 PCP: [name] Hughs, NP    Brief Narrative:   [name] [name] is a 88 y.o. 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  Patient reports onset after eating dinner and family report that she has been less responsive and lethargic.  Last known normal was at [place].  Per patient's son, patient was doing fine earlier in the day and following dinner; he noticed that the patient was sleepy and not cooperative.  She was then noted to be slumped over in the chair on the right side and had a hard time waking her up.  Patient then was attempted to ambulate to her bedroom but had much difficulty.  Son reports similar episode 6 months ago and denies any new medications.  Also her appetite has been fine over the last few days.  No seizure activity reported.  In the ED, temperature 98.3 F, HR 73, RR 16, BP 145/69, SpO2 100% on room air.  WBC 6.4, hemoglobin 11.6, platelets 187.  Sodium 138, potassium 4.3, chloride 106, CO2 24, glucose 133, BUN 18, creatinine 1.04.  AST 26, ALT 23, total bilirubin 0.8.  High sensitive troponin 7> 5.  TSH 2.366.  Urinalysis negative.  EtOH level less than 10.  Chest x-ray with cardiomegaly with mildly distended pulmonary vasculature.  CT head without contrast with areas of low density in the high right frontoparietal region concerning for acute versus subacute infarct; atrophy, chronic small vessel disease.  Neurology was consulted.  [facility] consulted for admission for further evaluation management of suspected acute CVA.  Assessment & Plan:    Acute/subacute CVA right frontal parietal region Patient presenting to ED via EMS with weakness, lethargy and confusion.  Patient is afebrile without leukocytosis.  CT head with low-density high right frontoparietal region concerning for  acute versus subacute infarct.  MRI brain notable for 3 mm acute ischemic nonhemorrhagic infarct posterior left frontal lobe and innumerable scattered chronic micro hemorrhages involving the left greater than right cerebral hemispheres as well as the cerebellum.  TTE with LVEF 45-50%, LV mildly decreased function, LV with no regional wall motion normalities, mild concentric LVH, trivial MR, IVC normal in size.  TSH 2.366.  B12 698.  Carotid duplex ultrasound with no significant stenosis.  Neurology now signed off. -- Neurology following, appreciate assistance -- LDL: 28 -- Hemoglobin A1c: 5.4 -- PT/OT recommends SNF placement -- Plavix 75 mg p.o. daily -- Atorvastatin 10 mg p.o. daily -- Monitor on telemetry  Essential hypertension On amlodipine outpatient, holding to allow permissive hypertension in the setting of acute CVA. -- metoprolol tartrate 12.5 mg p.o. twice daily -- amlodipine '5mg'$  PO daily -- Hydralazine '10mg'$  IV q6h PRN SBP >220 or DBP >120  A-fib/A-flutter, transient; no previous history Overnight, patient was noted to be tachycardic with EKG concerning for a flutter versus fibrillation.  Patient is not a long-term anticoagulation candidate given her high fall risk with legal blindness. -- Metoprolol tartrate 12.5 mg p.o. twice daily -- Continue monitor on telemetry  Hyperlipidemia Total cholesterol 88, HDL 48. -- Home atorvastatin 10 mg p.o. daily -- Fasting LDL: Pending  Blindness 2/2 ARMD/glaucoma -- Supportive care  Delirium --Get up during the day --Encourage a familiar face to remain present throughout the day --Keep blinds open and lights on during daylight hours --Minimize the  use of opioids/benzodiazepines --Melatonin 3 mg p.o. nightly  DVT prophylaxis: SCD's Start: 09/12/22 0314    Code Status: DNR Family Communication: No family present at bedside this morning; updated patient's son [name] [name] via telephone this afternoon  Disposition Plan:  Level of care:  Telemetry Medical Status is: Observation The patient will require care spanning > 2 midnights and should be moved to inpatient because: Acute stroke, needs further monitoring on telemetry with anticipation of need for SNF placement    Consultants:  Neurology - signed off 12/8  Procedures:  TTE Carotid artery ultrasound:   Antimicrobials:  None   Subjective: Patient seen examined bedside, resting comfortably.  No family present.  No specific questions or concerns at this time.  Denies headache, no chest pain, no palpitations, no shortness of breath, no abdominal pain, no fever/chills/night sweats, no nausea/vomiting/diarrhea, no paresthesias.  No other acute events overnight per nursing staff.    Objective: Vitals:   09/13/22 2312 09/14/22 0412 09/14/22 0723 09/14/22 1109  BP: (!) 129/54 (!) 159/81 (!) 170/76 (!) 155/95  Pulse: (!) 58  72 63  Resp: '19 14 14 18  '$ Temp: 99.7 F (37.6 C) 98.3 F (36.8 C) 98.9 F (37.2 C) 99 F (37.2 C)  TempSrc: Oral Oral Axillary Oral  SpO2: 99% 99% 100% 100%  Weight:      Height:        Intake/Output Summary (Last 24 hours) at 09/14/2022 1151 Last data filed at 09/14/2022 0800 Gross per 24 hour  Intake 200 ml  Output 550 ml  Net -350 ml   Filed Weights   09/11/22 2216 09/12/22 2157  Weight: 63.5 kg 64.1 kg    Examination:  Physical Exam: GEN: NAD, alert and oriented x 3, thin/elderly in appearance HEENT: NCAT, PERRL, EOMI, sclera clear, MMM PULM: CTAB w/o wheezes/crackles, normal respiratory effort, on room air CV: RRR w/o M/G/R GI: abd soft, NTND, NABS, no R/G/M MSK: Trace lower extremity peripheral edema, muscle strength globally intact 5/5 bilateral upper/lower extremities NEURO: CN II-XII intact other than blindness which is chronic, no focal deficits, sensation to light touch intact PSYCH: normal mood/affect Integumentary: dry/intact, no rashes or wounds    Data Reviewed: I have personally reviewed following labs and  imaging studies  CBC: Recent Labs  Lab 09/11/22 2204 09/11/22 2208  WBC 6.4  --   NEUTROABS 3.5  --   HGB 11.6* 12.2  HCT 33.6* 36.0  MCV 91.6  --   PLT 187  --    Basic Metabolic Panel: Recent Labs  Lab 09/11/22 2204 09/11/22 2208  NA 138 136  K 4.3 4.5  CL 106 105  CO2 24  --   GLUCOSE 133* 131*  BUN 18 21  CREATININE 1.04* 1.10*  CALCIUM 8.9  --    GFR: Estimated Creatinine Clearance: 27.5 mL/min (A) (by C-G formula based on SCr of 1.1 mg/dL (H)). Liver Function Tests: Recent Labs  Lab 09/11/22 2204  AST 26  ALT 23  ALKPHOS 67  BILITOT 0.8  PROT 6.3*  ALBUMIN 3.3*   No results for input(s): "LIPASE", "AMYLASE" in the last 168 hours. Recent Labs  Lab 09/12/22 2232  AMMONIA <10   Coagulation Profile: Recent Labs  Lab 09/11/22 2204  INR 1.1   Cardiac Enzymes: No results for input(s): "CKTOTAL", "CKMB", "CKMBINDEX", "TROPONINI" in the last 168 hours. BNP (last 3 results) No results for input(s): "PROBNP" in the last 8760 hours. HbA1C: Recent Labs    09/12/22 0335  HGBA1C 5.4  CBG: No results for input(s): "GLUCAP" in the last 168 hours. Lipid Profile: Recent Labs    09/12/22 0610 09/12/22 2232  CHOL 88  --   HDL 48  --   LDLCALC NOT CALCULATED  --   TRIG <10  --   CHOLHDL 1.8  --   LDLDIRECT  --  28   Thyroid Function Tests: Recent Labs    09/13/22 0413  TSH 1.793   Anemia Panel: Recent Labs    09/11/22 2220 09/11/22 2304  VITAMINB12  --  698  FOLATE >40.0  --    Sepsis Labs: No results for input(s): "PROCALCITON", "LATICACIDVEN" in the last 168 hours.  Recent Results (from the past 240 hour(s))  Urine Culture     Status: None   Collection Time: 09/11/22 11:04 PM   Specimen: Urine, Clean Catch  Result Value Ref Range Status   Specimen Description URINE, CLEAN CATCH  Final   Special Requests NONE  Final   Culture   Final    NO GROWTH Performed at [facility], [address] [place], [place] [address]     Report Status 09/12/2022 FINAL  Final         Radiology Studies: VAS US CAROTID  Result Date: 09/13/2022 Carotid Arterial Duplex Study Patient Name:  BERYLE BAGSBY  Date of Exam:   09/12/2022 Medical Rec #: [id number]       Accession #:    [id number] Date of Birth: 05-Oct-1924      Patient Gender: F Patient Age:   40 years Exam Location:  [facility] Procedure:      VAS US CAROTID Referring Phys: Beulah Gandy --------------------------------------------------------------------------------  Indications:       CVA. Risk Factors:      PAD. Other Factors:     Legally blind bilaterally, GERD. Comparison Study:  09/23/2017 - 1-39% bilaterally Performing Technologist: Oda Cogan RDMS, RVT  Examination Guidelines: A complete evaluation includes B-mode imaging, spectral Doppler, color Doppler, and power Doppler as needed of all accessible portions of each vessel. Bilateral testing is considered an integral part of a complete examination. Limited examinations for reoccurring indications may be performed as noted.  Right Carotid Findings: +----------+--------+--------+--------+------------------+------------------+           PSV cm/sEDV cm/sStenosisPlaque DescriptionComments           +----------+--------+--------+--------+------------------+------------------+ CCA Prox  80      12                                                   +----------+--------+--------+--------+------------------+------------------+ CCA Distal52      13                                intimal thickening +----------+--------+--------+--------+------------------+------------------+ ICA Prox  43      12      1-39%                     intimal thickening +----------+--------+--------+--------+------------------+------------------+ ICA Mid   43      14                                                   +----------+--------+--------+--------+------------------+------------------+  ICA Distal110     20                                 tortuous           +----------+--------+--------+--------+------------------+------------------+ ECA       62      10                                                   +----------+--------+--------+--------+------------------+------------------+ +----------+--------+-------+----------------+-------------------+           PSV cm/sEDV cmsDescribe        Arm Pressure (mmHG) +----------+--------+-------+----------------+-------------------+ [id number]            Multiphasic, WNL                    +----------+--------+-------+----------------+-------------------+ +---------+--------+--+--------+-+---------+ VertebralPSV cm/s61EDV cm/s8Antegrade +---------+--------+--+--------+-+---------+  Left Carotid Findings: +----------+--------+--------+--------+------------------+------------------+           PSV cm/sEDV cm/sStenosisPlaque DescriptionComments           +----------+--------+--------+--------+------------------+------------------+ CCA Prox  73      12                                                   +----------+--------+--------+--------+------------------+------------------+ CCA Distal64      16                                intimal thickening +----------+--------+--------+--------+------------------+------------------+ ICA Prox  40      12      1-39%   focal and calcific                   +----------+--------+--------+--------+------------------+------------------+ ICA Mid   48      14                                                   +----------+--------+--------+--------+------------------+------------------+ ICA Distal84      16                                tortuous           +----------+--------+--------+--------+------------------+------------------+ ECA       107     15                                                   +----------+--------+--------+--------+------------------+------------------+  +----------+--------+--------+----------------+-------------------+           PSV cm/sEDV cm/sDescribe        Arm Pressure (mmHG) +----------+--------+--------+----------------+-------------------+ Subclavian119             Multiphasic, WNL                    +----------+--------+--------+----------------+-------------------+ +---------+--------+--+--------+--+---------+ VertebralPSV cm/s68EDV cm/s13Antegrade +---------+--------+--+--------+--+---------+   Summary: Right  Carotid: Velocities in the right ICA are consistent with a 1-39% stenosis. Left Carotid: Velocities in the left ICA are consistent with a 1-39% stenosis. Vertebrals:  Bilateral vertebral arteries demonstrate antegrade flow. Subclavians: Normal flow hemodynamics were seen in bilateral subclavian              arteries. *See table(s) above for measurements and observations.  Electronically signed by Antony Contras MD on 09/13/2022 at 12:41:39 PM.    Final         Scheduled Meds:  amLODipine  5 mg Oral Daily   atorvastatin  10 mg Oral Daily   clopidogrel  75 mg Oral Daily   melatonin  3 mg Oral QHS   metoprolol tartrate  12.5 mg Oral BID   risperiDONE  0.5 mg Oral BID   sodium chloride flush  3 mL Intravenous Once   Continuous Infusions:   LOS: 1 day    Time spent: 52 minutes spent on chart review, discussion with nursing staff, consultants, updating family and interview/physical exam; more than 50% of that time was spent in counseling and/or coordination of care.    [name] [name] J [place] DO [facility] Available via Epic secure chat 7am-7pm After these hours, please refer to coverage provider listed on [email or web address] 09/14/2022, 11:51 AM

## 2022-09-15 NOTE — Progress Notes (Signed)
PROGRESS NOTE    [name] [name]  ZDG:[id number] DOB: 09-30-1924 DOA: 09/11/2022 PCP: Sandrea Hughs, NP    Brief Narrative:   [name] [name] is a 88 y.o. 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  Patient reports onset after eating dinner and family report that she has been less responsive and lethargic.  Last known normal was at [place].  Per patient's son, patient was doing fine earlier in the day and following dinner; he noticed that the patient was sleepy and not cooperative.  She was then noted to be slumped over in the chair on the right side and had a hard time waking her up.  Patient then was attempted to ambulate to her bedroom but had much difficulty.  Son reports similar episode 6 months ago and denies any new medications.  Also her appetite has been fine over the last few days.  No seizure activity reported.  In the ED, temperature 98.3 F, HR 73, RR 16, BP 145/69, SpO2 100% on room air.  WBC 6.4, hemoglobin 11.6, platelets 187.  Sodium 138, potassium 4.3, chloride 106, CO2 24, glucose 133, BUN 18, creatinine 1.04.  AST 26, ALT 23, total bilirubin 0.8.  High sensitive troponin 7> 5.  TSH 2.366.  Urinalysis negative.  EtOH level less than 10.  Chest x-ray with cardiomegaly with mildly distended pulmonary vasculature.  CT head without contrast with areas of low density in the high right frontoparietal region concerning for acute versus subacute infarct; atrophy, chronic small vessel disease.  Neurology was consulted.  [facility] consulted for admission for further evaluation management of suspected acute CVA.  Assessment & Plan:    Acute/subacute CVA right frontal parietal region Patient presenting to ED via EMS with weakness, lethargy and confusion.  Patient is afebrile without leukocytosis.  CT head with low-density high right frontoparietal region concerning for  acute versus subacute infarct.  MRI brain notable for 3 mm acute ischemic nonhemorrhagic infarct posterior left frontal lobe and innumerable scattered chronic micro hemorrhages involving the left greater than right cerebral hemispheres as well as the cerebellum.  TTE with LVEF 45-50%, LV mildly decreased function, LV with no regional wall motion normalities, mild concentric LVH, trivial MR, IVC normal in size.  TSH 2.366.  B12 698.  Carotid duplex ultrasound with no significant stenosis.  Neurology now signed off. -- Neurology following, appreciate assistance -- LDL: 28 -- Hemoglobin A1c: 5.4 -- PT/OT recommends SNF placement -- Plavix 75 mg p.o. daily -- Atorvastatin 10 mg p.o. daily -- Monitor on telemetry  Essential hypertension On amlodipine outpatient, holding to allow permissive hypertension in the setting of acute CVA. -- metoprolol tartrate 12.5 mg p.o. twice daily -- amlodipine '5mg'$  PO daily -- Hydralazine '10mg'$  IV q6h PRN SBP >220 or DBP >120  A-fib/A-flutter, transient; no previous history Overnight, patient was noted to be tachycardic with EKG concerning for a flutter versus fibrillation.  Patient is not a long-term anticoagulation candidate given her high fall risk with legal blindness. -- Metoprolol tartrate 12.5 mg p.o. twice daily -- Continue monitor on telemetry  Hyperlipidemia Total cholesterol 88, HDL 48. -- Home atorvastatin 10 mg p.o. daily -- Fasting LDL: 28  Blindness 2/2 ARMD/glaucoma -- Supportive care  Delirium --Get up during the day --Encourage a familiar face to remain present throughout the day --Keep blinds open and lights on during daylight hours --Minimize the  use of opioids/benzodiazepines --Melatonin 3 mg p.o. nightly  DVT prophylaxis: SCD's Start: 09/12/22 0314    Code Status: DNR Family Communication: No family present at bedside this morning; updated patient's son [name] [name] via telephone this afternoon  Disposition Plan:  Level of care:  Telemetry Medical Status is: Observation The patient will require care spanning > 2 midnights and should be moved to inpatient because: Acute stroke, needs further monitoring on telemetry with anticipation of need for SNF placement    Consultants:  Neurology - signed off 12/8  Procedures:  TTE Carotid artery ultrasound:   Antimicrobials:  None   Subjective: Patient seen examined bedside, resting comfortably.  No family present.  No specific questions or concerns at this time.  Denies headache, no chest pain, no palpitations, no shortness of breath, no abdominal pain, no fever/chills/night sweats, no nausea/vomiting/diarrhea, no paresthesias.  No other acute events overnight per nursing staff.    Objective: Vitals:   09/14/22 2208 09/15/22 0000 09/15/22 0315 09/15/22 0834  BP: 112/70 (!) 117/50 (!) 158/67 (!) 144/68  Pulse: 68 65 64 62  Resp:  '20 18 17  '$ Temp:  98.2 F (36.8 C) 98.4 F (36.9 C) (!) 97.5 F (36.4 C)  TempSrc:  Axillary Axillary Oral  SpO2:  95% 100% 100%  Weight:      Height:        Intake/Output Summary (Last 24 hours) at 09/15/2022 1042 Last data filed at 09/15/2022 0600 Gross per 24 hour  Intake 380 ml  Output 800 ml  Net -420 ml   Filed Weights   09/11/22 2216 09/12/22 2157  Weight: 63.5 kg 64.1 kg    Examination:  Physical Exam: GEN: NAD, alert and oriented x 3, thin/elderly in appearance HEENT: NCAT, PERRL, EOMI, sclera clear, MMM PULM: CTAB w/o wheezes/crackles, normal respiratory effort, on room air CV: RRR w/o M/G/R GI: abd soft, NTND, NABS, no R/G/M MSK: Trace lower extremity peripheral edema, muscle strength globally intact 5/5 bilateral upper/lower extremities NEURO: CN II-XII intact other than blindness which is chronic, no focal deficits, sensation to light touch intact PSYCH: normal mood/affect Integumentary: dry/intact, no rashes or wounds    Data Reviewed: I have personally reviewed following labs and imaging  studies  CBC: Recent Labs  Lab 09/11/22 2204 09/11/22 2208  WBC 6.4  --   NEUTROABS 3.5  --   HGB 11.6* 12.2  HCT 33.6* 36.0  MCV 91.6  --   PLT 187  --    Basic Metabolic Panel: Recent Labs  Lab 09/11/22 2204 09/11/22 2208  NA 138 136  K 4.3 4.5  CL 106 105  CO2 24  --   GLUCOSE 133* 131*  BUN 18 21  CREATININE 1.04* 1.10*  CALCIUM 8.9  --    GFR: Estimated Creatinine Clearance (by C-G formula based on SCr of 1.1 mg/dL (H)) Female: 27.5 mL/min (A) Female: 34.9 mL/min (A) Liver Function Tests: Recent Labs  Lab 09/11/22 2204  AST 26  ALT 23  ALKPHOS 67  BILITOT 0.8  PROT 6.3*  ALBUMIN 3.3*   No results for input(s): "LIPASE", "AMYLASE" in the last 168 hours. Recent Labs  Lab 09/12/22 2232  AMMONIA <10   Coagulation Profile: Recent Labs  Lab 09/11/22 2204  INR 1.1   Cardiac Enzymes: No results for input(s): "CKTOTAL", "CKMB", "CKMBINDEX", "TROPONINI" in the last 168 hours. BNP (last 3 results) No results for input(s): "PROBNP" in the last 8760 hours. HbA1C: No results for input(s): "HGBA1C" in the last 72  hours.  CBG: No results for input(s): "GLUCAP" in the last 168 hours. Lipid Profile: Recent Labs    09/12/22 2232  LDLDIRECT 28   Thyroid Function Tests: Recent Labs    09/13/22 0413  TSH 1.793   Anemia Panel: No results for input(s): "VITAMINB12", "FOLATE", "FERRITIN", "TIBC", "IRON", "RETICCTPCT" in the last 72 hours.  Sepsis Labs: No results for input(s): "PROCALCITON", "LATICACIDVEN" in the last 168 hours.  Recent Results (from the past 240 hour(s))  Urine Culture     Status: None   Collection Time: 09/11/22 11:04 PM   Specimen: Urine, Clean Catch  Result Value Ref Range Status   Specimen Description URINE, CLEAN CATCH  Final   Special Requests NONE  Final   Culture   Final    NO GROWTH Performed at [facility], [address], [place], [place] [address]    Report Status 09/12/2022 FINAL  Final          Radiology Studies: No results found.      Scheduled Meds:  amLODipine  5 mg Oral Daily   atorvastatin  10 mg Oral Daily   clopidogrel  75 mg Oral Daily   melatonin  3 mg Oral QHS   metoprolol tartrate  12.5 mg Oral BID   risperiDONE  0.5 mg Oral BID   sodium chloride flush  3 mL Intravenous Once   Continuous Infusions:   LOS: 2 days    Time spent: 52 minutes spent on chart review, discussion with nursing staff, consultants, updating family and interview/physical exam; more than 50% of that time was spent in counseling and/or coordination of care.    Alvilda Mckenna J [place] DO [facility] Available via Epic secure chat 7am-7pm After these hours, please refer to coverage provider listed on [email or web address] 09/15/2022, 10:42 AM

## 2022-09-15 NOTE — Plan of Care (Signed)

## 2022-09-16 NOTE — TOC Initial Note (Addendum)
Transition of Care [facility]) - Initial/Assessment Note    Patient Details  Name: [name] [name] MRN: [id number] Date of Birth: [birth year]  Transition of Care [facility]) CM/SW Contact:    Pollie Friar, RN Phone Number: 09/16/2022, 12:56 PM  Clinical Narrative:                 CM met with the patient and then called her son who asked me to call the patients daughter. CM spoke to patients daughter over the phone. She was more interested in LT placement at an ALF. CM went over that we do not d/c to ALF unless pts arrived to the hospital from an ALF. CM explained that we d/c to SNF rehab and they could work on ALF while she is in rehab and transition her there afterwards. Or they can take the patient home and get her into an ALF. She asked to discuss this with her brother and have CM call back.  TOC following.  1440: daughter has selected [facility] for rehab. They will have a bed for her tomorrow. CM has updated the MD.  Expected Discharge Plan: [place] Barriers to Discharge: Continued Medical Work up   Patient Goals and CMS Choice   CMS Medicare.gov Compare Post Acute Care list provided to:: Patient Represenative (must comment) Choice offered to / list presented to : Adult Children  Expected Discharge Plan and Services Expected Discharge Plan: [place]   Discharge Planning Services: CM Consult Post Acute Care Choice: [place] Living arrangements for the past 2 months: Single Family Home                                      Prior Living Arrangements/Services Living arrangements for the past 2 months: Single Family Home Lives with:: Adult Children Patient language and need for interpreter reviewed:: Yes Do you feel safe going back to the place where you live?: Yes      Need for Family Participation in Patient Care: Yes (Comment) Care giver support system in place?: Yes (comment)   Criminal Activity/Legal Involvement  Pertinent to Current Situation/Hospitalization: No - Comment as needed  Activities of Daily Living      Permission Sought/Granted                  Emotional Assessment Appearance:: Appears stated age   Affect (typically observed): Accepting Orientation: : Oriented to Self   Psych Involvement: No (comment)  Admission diagnosis:  Acute ischemic stroke [facility]) [I63.9] Cerebrovascular accident (CVA), unspecified mechanism ([place]) [I63.9] Patient Active Problem List   Diagnosis Date Noted   Acute ischemic stroke ([place]) 09/12/2022   Legally blind 09/12/2022   Allergic rhinitis 05/08/2021   Mild persistent asthma, uncomplicated 92/92/4462   Vasomotor rhinitis 05/08/2021   Cornea edema 09/12/2020   Pseudophakia of both eyes 09/12/2020   Leg cramps 05/27/2019   Bilateral primary osteoarthritis of knee 05/27/2019   Macular degeneration of left eye 02/12/2018   Episode of visual loss of left eye 09/03/2017   Primary open angle glaucoma (POAG) of right eye, severe stage 09/03/2017   Bradycardia, drug induced 06/11/2016   Hyperlipidemia 02/06/2016   Headache 12/14/2015   Cerebral infarction due to embolism of cerebral artery (HCC)    Acute CVA (cerebrovascular accident) ([place]) 12/09/2015   Dizziness 12/09/2015   CVA (cerebral infarction) 12/09/2015   Asthma, mild intermittent 04/04/2014  Gastroesophageal reflux disease without esophagitis 04/04/2014   Neuropathy of both feet 04/04/2014   Diuretic-induced hypokalemia 01/03/2014   Olfactory hallucinations 11/23/2013   SOB (shortness of breath) 10/26/2013   Edema of extremities 10/26/2013   Essential hypertension    Osteoarthritis    Seasonal allergies    Glaucoma    PVD (peripheral vascular disease) ([place])    Anxiety    PCP:  Sandrea Hughs, NP Pharmacy:   [id number]: [phone]: [phone]    Social  Determinants of Health (SDOH) Interventions    Readmission Risk Interventions     No data to display

## 2022-09-16 NOTE — Progress Notes (Signed)
Physical Therapy Treatment Patient Details Name: [name] [name] MRN: [id number] DOB: 08-03-1924 Today's Date: 09/16/2022   History of Present Illness Pt is a 88 y/o female who presented with AMS and progressive weakness. MRI brain showed 64m acute infarct of posterior L frontal lobe and multiple scattered chronic micro hemorrhages in cerebrum and cerebellum. PMH: asthma, HTN, GERD, glaucoma with blindness, CVA, PVD    PT Comments    Patient willing to work with therapy and when stated she needed to use the bathroom, [facility] Unfortunately, unable to get pt standing with RW with +2 max assist and [facility] pt to supine and assisted onto bedpan. Patient reported she thinks some of the reason she could not stand was due to fear.     Recommendations for follow up therapy are one component of a multi-disciplinary discharge planning process, led by the attending physician.  Recommendations may be updated based on patient status, additional functional criteria and insurance authorization.  Follow Up Recommendations  Skilled nursing-short term rehab (<3 hours/day) Can patient physically be transported by private vehicle: No   Assistance Recommended at Discharge Frequent or constant Supervision/Assistance  Patient can return home with the following Two people to help with walking and/or transfers;Assistance with cooking/housework;Direct supervision/assist for medications management;Direct supervision/assist for financial management;Assist for transportation;Help with stairs or ramp for entrance   Equipment Recommendations  None recommended by PT    Recommendations for Other Services       Precautions / Restrictions Precautions Precautions: Fall;Other (comment) Precaution Comments: blind; monitor BP and HR Restrictions Weight Bearing Restrictions: No     Mobility  Bed Mobility Overal bed mobility: Needs Assistance Bed Mobility: Supine to Sit, Sit  to Supine     Supine to sit: Mod assist Sit to supine: Max assist, +2 for physical assistance   General bed mobility comments: assist to move legs over EOB and to raise [facility] from [facility]; assist to guide torso and lift legs back onto bed into supine    Transfers Overall transfer level: Needs assistance Equipment used: Rolling walker (2 wheels) Transfers: Sit to/from Stand Sit to Stand: Max assist, +2 physical assistance, From elevated surface           General transfer comment: pt with difficulty getting rt foot under her and foot sliding forward as attempting coming to stand; elevated bed to better get feet under her and still unable to come to full standing    Ambulation/Gait               General Gait Details: unable   Stairs             Wheelchair Mobility    Modified Rankin (Stroke Patients Only) Modified Rankin (Stroke Patients Only) Pre-Morbid Rankin Score: Moderately severe disability Modified Rankin: Severe disability     Balance Overall balance assessment: Needs assistance Sitting-balance support: No upper extremity supported, Feet supported Sitting balance-Leahy Scale: Fair     Standing balance support: Bilateral upper extremity supported, Reliant on assistive device for balance, During functional activity Standing balance-Leahy Scale: Poor Standing balance comment: strong posterior bias with difficulty coming forward over her BOS; able to clear buttocks off bed x 2 but could not achieve full standing with +2 max assist                            Cognition Arousal/Alertness: Awake/alert Behavior [facility]  for tasks assessed/performed Overall Cognitive Status: No family/caregiver present to determine baseline cognitive functioning                                 General Comments: oriented to self, hospital, stroke; time NT; following one step commands; able to express needs         Exercises      General Comments General comments (skin integrity, edema, etc.): pt requesting to use bathroom and agreed to try to get to [facility]. Unable with +2 assist      Pertinent Vitals/Pain Pain Assessment Pain Assessment: No/denies pain    Home Living                          Prior Function            PT Goals (current goals can now be found in the care plan section) Acute Rehab PT Goals Patient Stated Goal: unable to state Time For Goal Achievement: 09/26/22 Potential to Achieve Goals: Fair Progress towards PT goals: Progressing toward goals    Frequency    Min 2X/week      PT Plan Current plan remains appropriate    Co-evaluation              AM-PAC PT "6 Clicks" Mobility   Outcome Measure  Help needed turning from your back to your side while in a flat bed without using bedrails?: A Lot Help needed moving from lying on your back to sitting on the side of a flat bed without using bedrails?: Total Help needed moving to and from a bed to a chair (including a wheelchair)?: Total Help needed standing up from a chair using your arms (e.g., wheelchair or bedside chair)?: Total Help needed to walk in hospital room?: Total Help needed climbing 3-5 steps with a railing? : Total 6 Click Score: 7    End of Session Equipment Utilized During Treatment: Gait belt Activity Tolerance: Other (comment) (pt reports fearful) Patient left: with call bell/phone within reach;in bed;with bed alarm set;Other (comment) (on bedpan) Nurse Communication: Mobility status (on bedpan) PT Visit Diagnosis: Muscle weakness (generalized) (M62.81);Difficulty in walking, not elsewhere classified (R26.2)     Time: 6073-7106 PT Time Calculation (min) (ACUTE ONLY): 22 min  Charges:  $Therapeutic Activity: 8-22 mins                      Arby Barrette, PT Acute Rehabilitation Services  Office [phone]    Rexanne Mano 09/16/2022, 11:13 AM

## 2022-09-16 NOTE — Progress Notes (Signed)
Occupational Therapy Treatment Patient Details Name: [name] [name] MRN: [id number] DOB: [birth year] Today's Date: 09/16/2022   History of present illness Pt is a 88 y/o female who presented with AMS and progressive weakness. MRI brain showed 36m acute infarct of posterior L frontal lobe and multiple scattered chronic micro hemorrhages in cerebrum and cerebellum. PMH: asthma, HTN, GERD, glaucoma with blindness, CVA, PVD   OT comments  Patient seen with PT and OT to advance with mobility. Patient with more alert affect, however continuing to require max to total A of 2 for bed mobility and transfers. [facility] due to max A of 2 to stand, with significant posterior lean to safely pivot. Patient placed on bed pan at end of session. Discharge recommendation made for SNF due to current level of function. OT will continue to follow.    Recommendations for follow up therapy are one component of a multi-disciplinary discharge planning process, led by the attending physician.  Recommendations may be updated based on patient status, additional functional criteria and insurance authorization.    Follow Up Recommendations  Skilled nursing-short term rehab (<3 hours/day)     Assistance Recommended at Discharge Frequent or constant Supervision/Assistance  Patient can return home with the following  Two people to help with walking and/or transfers;Two people to help with bathing/dressing/bathroom;Assistance with cooking/housework;Assistance with feeding;Direct supervision/assist for financial management;Direct supervision/assist for medications management;Assist for transportation;Help with stairs or ramp for entrance   Equipment Recommendations  Other (comment) (Defer to next venue)    Recommendations for Other Services      Precautions / Restrictions Precautions Precautions: Fall;Other (comment) Precaution Comments: blind; monitor BP and  HR Restrictions Weight Bearing Restrictions: No       Mobility Bed Mobility Overal bed mobility: Needs Assistance Bed Mobility: Supine to Sit, Sit to Supine     Supine to sit: Mod assist Sit to supine: Max assist, +2 for physical assistance   General bed mobility comments: assist to move legs over EOB and to raise torso from [facility] position; assist to guide torso and lift legs back onto bed into supine    Transfers Overall transfer level: Needs assistance Equipment used: Rolling walker (2 wheels) Transfers: Sit to/from Stand Sit to Stand: Max assist, +2 physical assistance, From elevated surface           General transfer comment: pt with difficulty getting rt foot under her and foot sliding forward as attempting coming to stand; elevated bed to better get feet under her and still unable to come to full standing     Balance Overall balance assessment: Needs assistance Sitting-balance support: No upper extremity supported, Feet supported Sitting balance-Leahy Scale: Fair     Standing balance support: Bilateral upper extremity supported, Reliant on assistive device for balance, During functional activity Standing balance-Leahy Scale: Poor Standing balance comment: strong posterior bias with difficulty coming forward over her BOS; able to clear buttocks off bed x 2 but could not achieve full standing with +2 max assist                           ADL either performed or assessed with clinical judgement   ADL Overall ADL's : Needs assistance/impaired                         Toilet Transfer: Total assistance;+2 for physical assistance;+2 for safety/equipment Toilet Transfer Details (indicate cue  type and reason): unable to come into standing with significant posterior lean, put on bed pan at end of session         Functional mobility during ADLs: Maximal assistance;+2 for physical assistance;+2 for safety/equipment General ADL Comments: Session  focus on advancing functional mobility and activity tolerance    Extremity/Trunk Assessment              Vision       Perception     Praxis      Cognition Arousal/Alertness: Awake/alert Behavior During Therapy: WFL for tasks assessed/performed Overall Cognitive Status: No family/caregiver present to determine baseline cognitive functioning                                 General Comments: oriented to self, hospital, stroke; time NT; following one step commands; able to express needs        Exercises      Shoulder Instructions       General Comments pt requesting to use bathroom and agreed to try to get to [facility]. Unable with +2 assist    Pertinent Vitals/ Pain          Home Living                                          Prior Functioning/Environment              Frequency  Min 2X/week        Progress Toward Goals  OT Goals(current goals can now be found in the care plan section)  Progress towards OT goals: Progressing toward goals  Acute Rehab OT Goals Patient Stated Goal: to go to the bathroom OT Goal Formulation: With patient Time For Goal Achievement: 09/26/22 Potential to Achieve Goals: [place] Discharge plan remains appropriate    Co-evaluation                 AM-PAC OT "6 Clicks" Daily Activity     Outcome Measure   Help from another person eating meals?: A Lot Help from another person taking care of personal grooming?: A Lot Help from another person toileting, which includes using toliet, bedpan, or urinal?: Total Help from another person bathing (including washing, rinsing, drying)?: A Lot Help from another person to put on and taking off regular upper body clothing?: A Little Help from another person to put on and taking off regular lower body clothing?: Total 6 Click Score: 11    End of Session Equipment Utilized During Treatment: Gait belt;Rolling walker (2 wheels)  OT Visit  Diagnosis: Other abnormalities of gait and mobility (R26.89);Muscle weakness (generalized) (M62.81)   Activity Tolerance Patient limited by fatigue   Patient Left in bed;with call bell/phone within reach;with bed alarm set;Other (comment) (on bed pan)   Nurse Communication Mobility status;Other (comment) (needs soft touch call bell, on bed pan)        Time: 1696-7893 OT Time Calculation (min): 22 min  Charges: OT General Charges $OT Visit: 1 Visit  Corinne Ports E. Yuma Blucher, OTR/L Acute Rehabilitation Services [phone]   Ascencion Dike 09/16/2022, 2:26 PM

## 2022-09-16 NOTE — Progress Notes (Signed)
PROGRESS NOTE    [name] [name]  GUR:[id number] DOB: Jul 03, 1924 DOA: 09/11/2022 PCP: Sandrea Hughs, NP    Brief Narrative:   [name] [name] is a 88 y.o. 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 ED on 12/6 via EMS complaining of weakness.  Patient reports onset after eating dinner and family report that she has been less responsive and lethargic.  Last known normal was at [place].  Per patient's son, patient was doing fine earlier in the day and following dinner; he noticed that the patient was sleepy and not cooperative.  She was then noted to be slumped over in the chair on the right side and had a hard time waking her up.  Patient then was attempted to ambulate to her bedroom but had much difficulty.  Son reports similar episode 6 months ago and denies any new medications.  Also her appetite has been fine over the last few days.  No seizure activity reported.  In the ED, temperature 98.3 F, HR 73, RR 16, BP 145/69, SpO2 100% on room air.  WBC 6.4, hemoglobin 11.6, platelets 187.  Sodium 138, potassium 4.3, chloride 106, CO2 24, glucose 133, BUN 18, creatinine 1.04.  AST 26, ALT 23, total bilirubin 0.8.  High sensitive troponin 7> 5.  TSH 2.366.  Urinalysis negative.  EtOH level less than 10.  Chest x-ray with cardiomegaly with mildly distended pulmonary vasculature.  CT head without contrast with areas of low density in the high right frontoparietal region concerning for acute versus subacute infarct; atrophy, chronic small vessel disease.  Neurology was consulted.  [facility] consulted for admission for further evaluation management of suspected acute CVA.  Assessment & Plan:    Acute/subacute CVA right frontal parietal region Patient presenting to ED via EMS with weakness, lethargy and confusion.  Patient is afebrile without leukocytosis.  CT head with low-density high right frontoparietal region concerning for  acute versus subacute infarct.  MRI brain notable for 3 mm acute ischemic nonhemorrhagic infarct posterior left frontal lobe and innumerable scattered chronic micro hemorrhages involving the left greater than right cerebral hemispheres as well as the cerebellum.  TTE with LVEF 45-50%, LV mildly decreased function, LV with no regional wall motion normalities, mild concentric LVH, trivial MR, IVC normal in size.  TSH 2.366.  B12 698.  LDL 28, hemoglobin A1c 5.4.  Carotid duplex ultrasound with no significant stenosis.  Neurology now signed off. -- PT/OT recommends SNF placement, SW for placement -- Plavix 75 mg p.o. daily -- Atorvastatin 10 mg p.o. daily -- Monitor on telemetry  Essential hypertension On amlodipine outpatient, holding to allow permissive hypertension in the setting of acute CVA. -- metoprolol tartrate 12.5 mg p.o. twice daily -- amlodipine '5mg'$  PO daily -- Hydralazine '10mg'$  IV q6h PRN SBP >220 or DBP >120  A-fib/A-flutter, transient; no previous history Overnight, patient was noted to be tachycardic with EKG concerning for a flutter versus fibrillation.  Patient is not a long-term anticoagulation candidate given her high fall risk with legal blindness. -- Metoprolol tartrate 12.5 mg p.o. twice daily -- Continue monitor on telemetry  Hyperlipidemia Total cholesterol 88, HDL 48. -- Continue Home atorvastatin 10 mg p.o. daily -- Fasting LDL: 28  Blindness 2/2 ARMD/glaucoma -- Supportive care  Delirium --Get up during the day --Encourage a familiar face to remain present throughout the day --Keep blinds open and lights on during daylight hours --Minimize the use of  opioids/benzodiazepines --Melatonin 3 mg p.o. nightly  DVT prophylaxis: SCD's Start: 09/12/22 0314    Code Status: DNR Family Communication: No family present at bedside this morning  Disposition Plan:  Level of care: Telemetry Medical Status is: Inpatient Remains inpatient appropriate because: Pending SNF  placement    Consultants:  Neurology - signed off 12/8  Procedures:  TTE Carotid artery ultrasound:   Antimicrobials:  None   Subjective: Patient seen examined bedside, resting comfortably.  No family present.  No specific questions or concerns at this time.  Denies headache, no chest pain, no palpitations, no shortness of breath, no abdominal pain, no fever/chills/night sweats, no nausea/vomiting/diarrhea, no paresthesias.  No other acute events overnight per nursing staff.  Medically stable for discharge once SNF bed available  Objective: Vitals:   09/16/22 0000 09/16/22 0400 09/16/22 0720 09/16/22 1102  BP: (!) 121/52 138/60 (!) 158/69 123/69  Pulse: (!) 58 (!) 51 63 (!) 54  Resp: '20 19 20 20  '$ Temp: 98 F (36.7 C) 98.9 F (37.2 C) 98.3 F (36.8 C) 97.8 F (36.6 C)  TempSrc: Axillary Oral Axillary Oral  SpO2: 99%  100% 97%  Weight:      Height:        Intake/Output Summary (Last 24 hours) at 09/16/2022 1105 Last data filed at 09/16/2022 1100 Gross per 24 hour  Intake 500 ml  Output 321 ml  Net 179 ml   Filed Weights   09/11/22 2216 09/12/22 2157  Weight: 63.5 kg 64.1 kg    Examination:  Physical Exam: GEN: NAD, alert and oriented x 3, thin/elderly in appearance HEENT: NCAT, PERRL, EOMI, sclera clear, MMM PULM: CTAB w/o wheezes/crackles, normal respiratory effort, on room air CV: RRR w/o M/G/R GI: abd soft, NTND, NABS, no R/G/M MSK: Trace lower extremity peripheral edema, muscle strength globally intact 5/5 bilateral upper/lower extremities NEURO: CN II-XII intact other than blindness which is chronic, no focal deficits, sensation to light touch intact PSYCH: normal mood/affect Integumentary: dry/intact, no rashes or wounds    Data Reviewed: I have personally reviewed following labs and imaging studies  CBC: Recent Labs  Lab 09/11/22 2204 09/11/22 2208  WBC 6.4  --   NEUTROABS 3.5  --   HGB 11.6* 12.2  HCT 33.6* 36.0  MCV 91.6  --   PLT 187   --    Basic Metabolic Panel: Recent Labs  Lab 09/11/22 2204 09/11/22 2208  NA 138 136  K 4.3 4.5  CL 106 105  CO2 24  --   GLUCOSE 133* 131*  BUN 18 21  CREATININE 1.04* 1.10*  CALCIUM 8.9  --    GFR: Estimated Creatinine Clearance (by C-G formula based on SCr of 1.1 mg/dL (H)) Female: 27.5 mL/min (A) Female: 34.9 mL/min (A) Liver Function Tests: Recent Labs  Lab 09/11/22 2204  AST 26  ALT 23  ALKPHOS 67  BILITOT 0.8  PROT 6.3*  ALBUMIN 3.3*   No results for input(s): "LIPASE", "AMYLASE" in the last 168 hours. Recent Labs  Lab 09/12/22 2232  AMMONIA <10   Coagulation Profile: Recent Labs  Lab 09/11/22 2204  INR 1.1   Cardiac Enzymes: No results for input(s): "CKTOTAL", "CKMB", "CKMBINDEX", "TROPONINI" in the last 168 hours. BNP (last 3 results) No results for input(s): "PROBNP" in the last 8760 hours. HbA1C: No results for input(s): "HGBA1C" in the last 72 hours.  CBG: No results for input(s): "GLUCAP" in the last 168 hours. Lipid Profile: No results for input(s): "CHOL", "HDL", "LDLCALC", "  TRIG", "CHOLHDL", "LDLDIRECT" in the last 72 hours.  Thyroid Function Tests: No results for input(s): "TSH", "T4TOTAL", "FREET4", "T3FREE", "THYROIDAB" in the last 72 hours.  Anemia Panel: No results for input(s): "VITAMINB12", "FOLATE", "FERRITIN", "TIBC", "IRON", "RETICCTPCT" in the last 72 hours.  Sepsis Labs: No results for input(s): "PROCALCITON", "LATICACIDVEN" in the last 168 hours.  Recent Results (from the past 240 hour(s))  Urine Culture     Status: None   Collection Time: 09/11/22 11:04 PM   Specimen: Urine, Clean Catch  Result Value Ref Range Status   Specimen Description URINE, CLEAN CATCH  Final   Special Requests NONE  Final   Culture   Final    NO GROWTH Performed at [facility], [address] [place], [place] [address]    Report Status 09/12/2022 FINAL  Final         Radiology Studies: No results found.      Scheduled  Meds:  amLODipine  5 mg Oral Daily   atorvastatin  10 mg Oral Daily   clopidogrel  75 mg Oral Daily   melatonin  3 mg Oral QHS   metoprolol tartrate  12.5 mg Oral BID   risperiDONE  0.5 mg Oral BID   sodium chloride flush  3 mL Intravenous Once   Continuous Infusions:   LOS: 3 days    Time spent: 52 minutes spent on chart review, discussion with nursing staff, consultants, updating family and interview/physical exam; more than 50% of that time was spent in counseling and/or coordination of care.    Syble Picco J [place] DO [facility] Available via Epic secure chat 7am-7pm After these hours, please refer to coverage provider listed on [email or web address] 09/16/2022, 11:05 AM

## 2022-09-16 NOTE — Care Management Important Message (Signed)
Important Message  Patient Details  Name: [name] [name] MRN: [id number] Date of Birth: July 01, 1924   Medicare Important Message Given:  Yes     Orbie Pyo 09/16/2022, 3:36 PM

## 2022-09-16 NOTE — NC FL2 (Signed)
[place] LEVEL OF CARE FORM     IDENTIFICATION  Patient Name: [name] [name] Birthdate: July 14, 1924 Sex: adult Admission Date (Current Location): 09/11/2022  [facility] and [place] Number:  Herbalist and Address:  [facility] [place], [place] [address], [place], [place] [address]      Provider Number: [id number]  Attending Physician Name and Address:  [place] Donnamarie Poag, DO  Relative Name and Phone Number:       Current Level of Care: Hospital Recommended Level of Care: [place] Prior Approval Number:    Date Approved/Denied:   PASRR Number: [id number]  Discharge Plan: SNF    Current Diagnoses: Patient Active Problem List   Diagnosis Date Noted   Acute ischemic stroke ([place]) 09/12/2022   Legally blind 09/12/2022   Allergic rhinitis 05/08/2021   Mild persistent asthma, uncomplicated 50/06/3817   Vasomotor rhinitis 05/08/2021   Cornea edema 09/12/2020   Pseudophakia of both eyes 09/12/2020   Leg cramps 05/27/2019   Bilateral primary osteoarthritis of knee 05/27/2019   Macular degeneration of left eye 02/12/2018   Episode of visual loss of left eye 09/03/2017   Primary open angle glaucoma (POAG) of right eye, severe stage 09/03/2017   Bradycardia, drug induced 06/11/2016   Hyperlipidemia 02/06/2016   Headache 12/14/2015   Cerebral infarction due to embolism of cerebral artery (HCC)    Acute CVA (cerebrovascular accident) ([place]) 12/09/2015   Dizziness 12/09/2015   CVA (cerebral infarction) 12/09/2015   Asthma, mild intermittent 04/04/2014   Gastroesophageal reflux disease without esophagitis 04/04/2014   Neuropathy of both feet 04/04/2014   Diuretic-induced hypokalemia 01/03/2014   Olfactory hallucinations 11/23/2013   SOB (shortness of breath) 10/26/2013   Edema of extremities 10/26/2013   Essential hypertension    Osteoarthritis    Seasonal allergies    Glaucoma    PVD (peripheral vascular disease) ([place])     Anxiety     Orientation RESPIRATION BLADDER Height & Weight     Self  Normal Incontinent Weight: 64.1 kg Height:  '5\' 5"'$  (165.1 cm)  BEHAVIORAL SYMPTOMS/MOOD NEUROLOGICAL BOWEL NUTRITION STATUS      Continent Diet (heart healthy with thin liquids)  AMBULATORY STATUS COMMUNICATION OF NEEDS Skin   Extensive Assist Verbally Normal                       Personal Care Assistance Level of Assistance  Bathing, Feeding, Dressing Bathing Assistance: Maximum assistance Feeding assistance: Limited assistance Dressing Assistance: Maximum assistance     Functional Limitations Info  Sight, Hearing, Speech Sight Info: Impaired (Blind) Hearing Info: Adequate Speech Info: Adequate    SPECIAL CARE FACTORS FREQUENCY  PT (By licensed PT), OT (By licensed OT)     PT Frequency: 5x/wk OT Frequency: 5x/wk            Contractures Contractures Info: Not present    Additional Factors Info  Code Status, Allergies, Psychotropic Code Status Info: DNR Allergies Info: Aspirin, Codeine, Iodine, Prednisone, Lyrica (Pregabalin), Benzonatate, Sulfa Antibiotics Psychotropic Info: Melatonin 3 mg at bedtime/ resperdal 0.5 mg BID         Current Medications (09/16/2022):  This is the current hospital active medication list Current Facility-Administered Medications  Medication Dose Route Frequency Provider Last Rate Last Admin   acetaminophen (TYLENOL) tablet 650 mg  650 mg Oral Q4H PRN Etta Quill, DO   650 mg at 09/16/22 1032   Or   acetaminophen (TYLENOL) 160 MG/5ML  solution 650 mg  650 mg Per Tube Q4H PRN Etta Quill, DO       Or   acetaminophen (TYLENOL) suppository 650 mg  650 mg Rectal Q4H PRN Etta Quill, DO       amLODipine (NORVASC) tablet 5 mg  5 mg Oral Daily [place] Donnamarie Poag, DO   5 mg at 09/16/22 0955   atorvastatin (LIPITOR) tablet 10 mg  10 mg Oral Daily Jennette Kettle M, DO   10 mg at 09/16/22 0955   clopidogrel (PLAVIX) tablet 75 mg  75 mg Oral Daily Etta Quill, DO   75 mg at 09/16/22 0955   hydrALAZINE (APRESOLINE) injection 10 mg  10 mg Intravenous Q6H PRN [place] Eric J, DO   10 mg at 09/13/22 9774   LORazepam (ATIVAN) tablet 0.5 mg  0.5 mg Oral Q6H PRN [place] Donnamarie Poag, DO       melatonin tablet 3 mg  3 mg Oral QHS [place] Donnamarie Poag, DO   3 mg at 09/15/22 2221   metoprolol tartrate (LOPRESSOR) tablet 12.5 mg  12.5 mg Oral BID [place] Eric J, DO   12.5 mg at 09/16/22 1423   risperiDONE (RISPERDAL) tablet 0.5 mg  0.5 mg Oral BID [place] Eric J, DO   0.5 mg at 09/16/22 9532   sodium chloride flush (NS) 0.9 % injection 3 mL  3 mL Intravenous Once Drenda Freeze, MD         Discharge Medications: Please see discharge summary for a list of discharge medications.  Relevant Imaging Results:  Relevant Lab Results:   Additional Information SS#: [id number]  Pollie Friar, RN

## 2022-09-16 NOTE — Plan of Care (Signed)

## 2022-09-17 DIAGNOSIS — N179 Acute kidney failure, unspecified: Secondary | ICD-10-CM | POA: Diagnosis not present

## 2022-09-17 DIAGNOSIS — H540X33 Blindness right eye category 3, blindness left eye category 3: Secondary | ICD-10-CM | POA: Diagnosis not present

## 2022-09-17 DIAGNOSIS — J45998 Other asthma: Secondary | ICD-10-CM | POA: Diagnosis not present

## 2022-09-17 DIAGNOSIS — I10 Essential (primary) hypertension: Secondary | ICD-10-CM | POA: Diagnosis not present

## 2022-09-17 DIAGNOSIS — G5793 Unspecified mononeuropathy of bilateral lower limbs: Secondary | ICD-10-CM | POA: Diagnosis not present

## 2022-09-17 DIAGNOSIS — K219 Gastro-esophageal reflux disease without esophagitis: Secondary | ICD-10-CM | POA: Diagnosis not present

## 2022-09-17 DIAGNOSIS — H409 Unspecified glaucoma: Secondary | ICD-10-CM | POA: Diagnosis not present

## 2022-09-17 DIAGNOSIS — K5900 Constipation, unspecified: Secondary | ICD-10-CM | POA: Diagnosis not present

## 2022-09-17 DIAGNOSIS — I639 Cerebral infarction, unspecified: Secondary | ICD-10-CM | POA: Diagnosis not present

## 2022-09-17 DIAGNOSIS — J302 Other seasonal allergic rhinitis: Secondary | ICD-10-CM | POA: Diagnosis not present

## 2022-09-17 DIAGNOSIS — M6281 Muscle weakness (generalized): Secondary | ICD-10-CM | POA: Diagnosis not present

## 2022-09-17 DIAGNOSIS — K649 Unspecified hemorrhoids: Secondary | ICD-10-CM | POA: Diagnosis not present

## 2022-09-17 DIAGNOSIS — H3530 Unspecified macular degeneration: Secondary | ICD-10-CM | POA: Diagnosis not present

## 2022-09-17 DIAGNOSIS — M1909 Primary osteoarthritis, other specified site: Secondary | ICD-10-CM | POA: Diagnosis not present

## 2022-09-17 DIAGNOSIS — Z7401 Bed confinement status: Secondary | ICD-10-CM | POA: Diagnosis not present

## 2022-09-17 DIAGNOSIS — I69920 Aphasia following unspecified cerebrovascular disease: Secondary | ICD-10-CM | POA: Diagnosis not present

## 2022-09-17 DIAGNOSIS — R29898 Other symptoms and signs involving the musculoskeletal system: Secondary | ICD-10-CM | POA: Diagnosis not present

## 2022-09-17 DIAGNOSIS — G459 Transient cerebral ischemic attack, unspecified: Secondary | ICD-10-CM | POA: Diagnosis not present

## 2022-09-17 DIAGNOSIS — I6930 Unspecified sequelae of cerebral infarction: Secondary | ICD-10-CM | POA: Diagnosis not present

## 2022-09-17 DIAGNOSIS — H401113 Primary open-angle glaucoma, right eye, severe stage: Secondary | ICD-10-CM | POA: Diagnosis not present

## 2022-09-17 DIAGNOSIS — R441 Visual hallucinations: Secondary | ICD-10-CM | POA: Diagnosis not present

## 2022-09-17 DIAGNOSIS — E860 Dehydration: Secondary | ICD-10-CM | POA: Diagnosis not present

## 2022-09-17 DIAGNOSIS — E46 Unspecified protein-calorie malnutrition: Secondary | ICD-10-CM | POA: Diagnosis not present

## 2022-09-17 DIAGNOSIS — I4892 Unspecified atrial flutter: Secondary | ICD-10-CM | POA: Diagnosis not present

## 2022-09-17 DIAGNOSIS — I517 Cardiomegaly: Secondary | ICD-10-CM | POA: Diagnosis not present

## 2022-09-17 DIAGNOSIS — E870 Hyperosmolality and hypernatremia: Secondary | ICD-10-CM | POA: Diagnosis not present

## 2022-09-17 DIAGNOSIS — R443 Hallucinations, unspecified: Secondary | ICD-10-CM | POA: Diagnosis not present

## 2022-09-17 DIAGNOSIS — I4891 Unspecified atrial fibrillation: Secondary | ICD-10-CM | POA: Diagnosis not present

## 2022-09-17 DIAGNOSIS — J309 Allergic rhinitis, unspecified: Secondary | ICD-10-CM | POA: Diagnosis not present

## 2022-09-17 DIAGNOSIS — E785 Hyperlipidemia, unspecified: Secondary | ICD-10-CM | POA: Diagnosis not present

## 2022-09-17 DIAGNOSIS — F0392 Unspecified dementia, unspecified severity, with psychotic disturbance: Secondary | ICD-10-CM | POA: Diagnosis not present

## 2022-09-17 DIAGNOSIS — I63532 Cerebral infarction due to unspecified occlusion or stenosis of left posterior cerebral artery: Secondary | ICD-10-CM | POA: Diagnosis not present

## 2022-09-17 DIAGNOSIS — E8809 Other disorders of plasma-protein metabolism, not elsewhere classified: Secondary | ICD-10-CM | POA: Diagnosis not present

## 2022-09-17 DIAGNOSIS — I739 Peripheral vascular disease, unspecified: Secondary | ICD-10-CM | POA: Diagnosis not present

## 2022-09-17 DIAGNOSIS — F419 Anxiety disorder, unspecified: Secondary | ICD-10-CM | POA: Diagnosis not present

## 2022-09-17 MED ORDER — METOPROLOL TARTRATE 25 MG PO TABS: 12.5000 mg | ORAL_TABLET | Freq: Two times a day (BID) | ORAL | Status: DC

## 2022-09-17 NOTE — TOC Transition Note (Signed)
Transition of Care [facility]) - CM/SW Discharge Note   Patient Details  Name: [name] [name] MRN: [id number] Date of Birth: [birth year]  [facility] CM/SW Contact:  Pollie Friar, RN Phone Number: 09/17/2022, 10:16 AM   Clinical Narrative:    Pt is discharging to [facility] SNF today. Daughter and bedside RN updated. Pt will transport via [facility] with a scheduled 11:30 am pick up time.  Discharge packet is at the desk.   Number for report: [phone]   Final next level of care: Skilled Nursing Facility Barriers to Discharge: No Barriers Identified   Patient Goals and CMS Choice   CMS Medicare.gov Compare Post Acute Care list provided to:: Patient Represenative (must comment) Choice offered to / list presented to : Adult Children  Discharge Placement PASRR number recieved: 09/16/22            Patient chooses bed at: [facility] Patient to be transferred to facility by: [place] Name of family member notified: Sandra--daughter Patient and family notified of of transfer: 09/17/22  Discharge Plan and Services   Discharge Planning Services: CM Consult Post Acute Care Choice: [place]                               Social Determinants of Health (SDOH) Interventions     Readmission Risk Interventions     No data to display

## 2022-09-17 NOTE — Discharge Summary (Signed)
Physician Discharge Summary  [name] IHK:[id number] DOB: October 29, 1923 DOA: 09/11/2022  PCP: Sandrea Hughs, NP  Admit date: 09/11/2022 Discharge date: 09/17/2022  Admitted From: Home Disposition: [place] [facility]   Recommendations for Outpatient Follow-up:  Follow up with PCP in 1-2 weeks Please obtain BMP/CBC in one week Please follow up on the following pending results:  Discharge Condition: Stable CODE STATUS: DNR Diet recommendation: Heart healthy diet  History of present illness:  [name] [name] is a 88 y.o. 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  Patient reports onset after eating dinner and family report that she has been less responsive and lethargic.  Last known normal was at [place].  Per patient's son, patient was doing fine earlier in the day and following dinner; he noticed that the patient was sleepy and not cooperative.  She was then noted to be slumped over in the chair on the right side and had a hard time waking her up.  Patient then was attempted to ambulate to her bedroom but had much difficulty.  Son reports similar episode 6 months ago and denies any new medications.  Also her appetite has been fine over the last few days.  No seizure activity reported.   In the ED, temperature 98.3 F, HR 73, RR 16, BP 145/69, SpO2 100% on room air.  WBC 6.4, hemoglobin 11.6, platelets 187.  Sodium 138, potassium 4.3, chloride 106, CO2 24, glucose 133, BUN 18, creatinine 1.04.  AST 26, ALT 23, total bilirubin 0.8.  High sensitive troponin 7> 5.  TSH 2.366.  Urinalysis negative.  EtOH level less than 10.  Chest x-ray with cardiomegaly with mildly distended pulmonary vasculature.  CT head without contrast with areas of low density in the high right frontoparietal region concerning for acute versus subacute infarct; atrophy, chronic small vessel  disease.  Neurology was consulted.  [facility] consulted for admission for further evaluation management of suspected acute CVA.  Hospital course:  Acute/subacute CVA right frontal parietal region Patient presenting to ED via EMS with weakness, lethargy and confusion.  Patient is afebrile without leukocytosis.  CT head with low-density high right frontoparietal region concerning for acute versus subacute infarct.  MRI brain notable for 3 mm acute ischemic nonhemorrhagic infarct posterior left frontal lobe and innumerable scattered chronic micro hemorrhages involving the left greater than right cerebral hemispheres as well as the cerebellum.  TTE with LVEF 45-50%, LV mildly decreased function, LV with no regional wall motion normalities, mild concentric LVH, trivial MR, IVC normal in size.  TSH 2.366.  B12 698.  LDL 28, hemoglobin A1c 5.4.  Carotid duplex ultrasound with no significant stenosis.  Seen and evaluated by neurology during hospitalization with recommendation to continue Plavix 75 mg p.o. daily as she is aspirin intolerant.  Continue atorvastatin 10 mg p.o. daily.   Essential hypertension On amlodipine outpatient, holding to allow permissive hypertension in the setting of acute CVA. -- metoprolol tartrate 12.5 mg p.o. twice daily -- amlodipine '5mg'$  PO daily -- Hydralazine '10mg'$  IV q6h PRN SBP >220 or DBP >120   A-fib/A-flutter, transient; no previous history Overnight, patient was noted to be tachycardic with EKG concerning for a flutter versus fibrillation.  Patient is not a long-term anticoagulation candidate given her high fall risk with legal blindness.  Started on Toprol tartrate 12.5 mg p.o. twice daily.  Consider outpatient follow-up with  cardiology.   Hyperlipidemia Total cholesterol 88, HDL 48, fasting LDL 28. Continue Home atorvastatin 10 mg p.o. daily   Blindness 2/2 ARMD/glaucoma Supportive care continue home eyedrops  Asthma: Continue Symbicort    Discharge Diagnoses:   Principal Problem:   Acute ischemic stroke [facility]) Active Problems:   Essential hypertension   Hyperlipidemia   Legally blind    Discharge Instructions  Discharge Instructions     Call MD for:  difficulty breathing, headache or visual disturbances   Complete by: As directed    Call MD for:  extreme fatigue   Complete by: As directed    Call MD for:  persistant dizziness or light-headedness   Complete by: As directed    Call MD for:  persistant nausea and vomiting   Complete by: As directed    Call MD for:  severe uncontrolled pain   Complete by: As directed    Call MD for:  temperature >100.4   Complete by: As directed    Diet - low sodium heart healthy   Complete by: As directed    Increase activity slowly   Complete by: As directed       Allergies as of 09/17/2022       Reactions   Aspirin Other (See Comments)   sweats   Codeine Other (See Comments)   Unknown reaction   Iodine Other (See Comments)   Pt is not aware of this allergy, does not recall much info.   Prednisone Other (See Comments)   sweats   Lyrica [pregabalin]    Wobbly, "more than sleepy"   Benzonatate Itching, Rash   Sulfa Antibiotics Other (See Comments)   Doesn't remember Other reaction(s): Unknown        Medication List     TAKE these medications    acetaminophen 500 MG tablet Commonly known as: TYLENOL Take 500 mg by mouth every 6 (six) hours as needed for mild pain or moderate pain.   albuterol 108 (90 Base) MCG/ACT inhaler Commonly known as: VENTOLIN HFA Inhale 2 puffs into the lungs every 6 (six) hours as needed for wheezing.   alum hydroxide-mag trisilicate 03-47 MG Chew chewable tablet Commonly known as: GAVISCON Chew 2 tablets by mouth 2 (two) times daily.   amLODipine 5 MG tablet Commonly known as: NORVASC TAKE 1 TABLET (5 MG TOTAL) BY MOUTH DAILY. Marland Kitchen   ascorbic acid 500 MG tablet Commonly known as: VITAMIN C Take 500 mg by mouth daily.   atorvastatin 10 MG  tablet Commonly known as: LIPITOR TAKE 1 TABLET BY MOUTH EVERY DAY   atropine 1 % ophthalmic solution Place 1 drop into the right eye daily.   Brinzolamide-Brimonidine 1-0.2 % Susp Place 1 drop into both eyes 3 (three) times daily.   budesonide-formoterol 80-4.5 MCG/ACT inhaler Commonly known as: SYMBICORT Inhale 2 puffs into the lungs 2 (two) times daily.   cetaphil cream Apply topically 2 (two) times daily. To legs   clopidogrel 75 MG tablet Commonly known as: PLAVIX TAKE 1 TABLET BY MOUTH EVERY DAY   CoQ10 50 MG Caps Take 50 mg by mouth daily.   docusate sodium 100 MG capsule Commonly known as: COLACE TAKE 1 CAPSULE BY MOUTH TWICE A DAY   hydrocortisone 2.5 % rectal cream Commonly known as: Anusol-HC Place 1 application rectally daily as needed for hemorrhoids or anal itching.   loratadine 10 MG tablet Commonly known as: CLARITIN Take 10 mg by mouth daily.   metoprolol tartrate 25 MG tablet Commonly known  as: LOPRESSOR Take 0.5 tablets (12.5 mg total) by mouth 2 (two) times daily.   NONFORMULARY OR COMPOUNDED ITEM [facility]:  Peripheral Neuropathy Cream - Bupivacaine 1%, Doxepin 3%, Gabapentin 6%, Pentoxifylline 3%, Topiramate 1%, apply 1-2 grams to affected areas 3-4 times daily prn.   risperiDONE 0.5 MG tablet Commonly known as: RISPERDAL TAKE 1 TABLET BY MOUTH TWICE A DAY   Rocklatan 0.02-0.005 % Soln Generic drug: Netarsudil-Latanoprost Place 1 drop into the left eye at bedtime.   PreserVision AREDS 2 Caps Take 1 capsule by mouth 2 (two) times daily.   Senior Multivitamin Plus Tabs Take 1 tablet by mouth daily.        Contact information for follow-up providers     Ngetich, Dinah C, NP. Schedule an appointment as soon as possible for a visit in 1 week(s).   Specialty: Family Medicine Contact information: [place] [address] [phone]              Contact information for after-discharge care     Destination      [facility] Preferred SNF .   Service: Skilled Nursing Contact information: [address] [address] [phone]                    Allergies  Allergen Reactions   Aspirin Other (See Comments)    sweats   Codeine Other (See Comments)    Unknown reaction   Iodine Other (See Comments)    Pt is not aware of this allergy, does not recall much info.   Prednisone Other (See Comments)    sweats   Lyrica [Pregabalin]     Wobbly, "more than sleepy"   Benzonatate Itching and Rash   Sulfa Antibiotics Other (See Comments)    Doesn't remember Other reaction(s): Unknown    Consultations: Neurology Janie   Procedures/Studies: VAS US CAROTID  Result Date: 09/13/2022 Carotid Arterial Duplex Study Patient Name:  KEVONA LUPINACCI  Date of Exam:   09/12/2022 Medical Rec #: [id number]       Accession #:    [id number] Date of Birth: [birth year]      Patient Gender: F Patient Age:   46 years Exam Location[facility]  [facility] Procedure:      VAS US CAROTID Referring Phys: Beulah Gandy --------------------------------------------------------------------------------  Indications:       CVA. Risk Factors:      PAD. Other Factors:     Legally blind bilaterally, GERD. Comparison Study:  09/23/2017 - 1-39% bilaterally Performing Technologist: Oda Cogan RDMS, RVT  Examination Guidelines: A complete evaluation includes B-mode imaging, spectral Doppler, color Doppler, and power Doppler as needed of all accessible portions of each vessel. Bilateral testing is considered an integral part of a complete examination. Limited examinations for reoccurring indications may be performed as noted.  Right Carotid Findings: +----------+--------+--------+--------+------------------+------------------+           PSV cm/sEDV cm/sStenosisPlaque DescriptionComments           +----------+--------+--------+--------+------------------+------------------+ CCA Prox  80       12                                                   +----------+--------+--------+--------+------------------+------------------+ CCA Distal52      13  intimal thickening +----------+--------+--------+--------+------------------+------------------+ ICA Prox  43      12      1-39%                     intimal thickening +----------+--------+--------+--------+------------------+------------------+ ICA Mid   43      14                                                   +----------+--------+--------+--------+------------------+------------------+ ICA Distal110     20                                tortuous           +----------+--------+--------+--------+------------------+------------------+ ECA       62      10                                                   +----------+--------+--------+--------+------------------+------------------+ +----------+--------+-------+----------------+-------------------+           PSV cm/sEDV cmsDescribe        Arm Pressure (mmHG) +----------+--------+-------+----------------+-------------------+ [id number]            Multiphasic, WNL                    +----------+--------+-------+----------------+-------------------+ +---------+--------+--+--------+-+---------+ VertebralPSV cm/s61EDV cm/s8Antegrade +---------+--------+--+--------+-+---------+  Left Carotid Findings: +----------+--------+--------+--------+------------------+------------------+           PSV cm/sEDV cm/sStenosisPlaque DescriptionComments           +----------+--------+--------+--------+------------------+------------------+ CCA Prox  73      12                                                   +----------+--------+--------+--------+------------------+------------------+ CCA Distal64      16                                intimal thickening  +----------+--------+--------+--------+------------------+------------------+ ICA Prox  40      12      1-39%   focal and calcific                   +----------+--------+--------+--------+------------------+------------------+ ICA Mid   48      14                                                   +----------+--------+--------+--------+------------------+------------------+ ICA Distal84      16                                tortuous           +----------+--------+--------+--------+------------------+------------------+ ECA       107     15                                                   +----------+--------+--------+--------+------------------+------------------+ +----------+--------+--------+----------------+-------------------+  PSV cm/sEDV cm/sDescribe        Arm Pressure (mmHG) +----------+--------+--------+----------------+-------------------+ Subclavian119             Multiphasic, WNL                    +----------+--------+--------+----------------+-------------------+ +---------+--------+--+--------+--+---------+ VertebralPSV cm/s68EDV cm/s13Antegrade +---------+--------+--+--------+--+---------+   Summary: Right Carotid: Velocities in the right ICA are consistent with a 1-39% stenosis. Left Carotid: Velocities in the left ICA are consistent with a 1-39% stenosis. Vertebrals:  Bilateral vertebral arteries demonstrate antegrade flow. Subclavians: Normal flow hemodynamics were seen in bilateral subclavian              arteries. *See table(s) above for measurements and observations.  Electronically signed by Antony Contras MD on 09/13/2022 at 12:41:39 PM.    Final    ECHOCARDIOGRAM COMPLETE  Result Date: 09/12/2022    ECHOCARDIOGRAM REPORT   Patient Name:   LISIA WESTBAY Date of Exam: 09/12/2022 Medical Rec #:  [id number]      Height:       65.0 in Accession #:    [id number]     Weight:       140.0 lb Date of Birth:  1924-01-24     BSA:          1.700 m  Patient Age:    88 years       BP:           208/183 mmHg Patient Gender: F              HR:           71 bpm. Exam Location:  Inpatient Procedure: 2D Echo, Color Doppler and Cardiac Doppler Indications:    Stroke i63.9  History:        Patient has prior history of Echocardiogram examinations, most                 recent 12/10/2015. Risk Factors:Hypertension and Dyslipidemia.  Sonographer:    Raquel Sarna Senior RDCS Referring Phys: [place]  1. Left ventricular ejection fraction, by estimation, is 45 to 50%. The left ventricle has mildly decreased function. The left ventricle has no regional wall motion abnormalities. There is mild concentric left ventricular hypertrophy. Left ventricular diastolic parameters are indeterminate.  2. Right ventricular systolic function is normal. The right ventricular size is mildly enlarged.  3. The mitral valve is normal in structure. Trivial mitral valve regurgitation. No evidence of mitral stenosis.  4. The aortic valve is tricuspid. Aortic valve regurgitation is not visualized. Aortic valve sclerosis is present, with no evidence of aortic valve stenosis.  5. The inferior vena cava is normal in size with greater than 50% respiratory variability, suggesting right atrial pressure of 3 mmHg. FINDINGS  Left Ventricle: Left ventricular ejection fraction, by estimation, is 45 to 50%. The left ventricle has mildly decreased function. The left ventricle has no regional wall motion abnormalities. The left ventricular internal cavity size was normal in size. There is mild concentric left ventricular hypertrophy. Left ventricular diastolic parameters are indeterminate. Right Ventricle: The right ventricular size is mildly enlarged. No increase in right ventricular wall thickness. Right ventricular systolic function is normal. Left Atrium: Left atrial size was normal in size. Right Atrium: Right atrial size was normal in size. Pericardium: There is no evidence of pericardial  effusion. Presence of epicardial fat layer. Mitral Valve: The mitral valve is normal in structure. Mild mitral annular calcification. Trivial mitral valve regurgitation. No evidence of  mitral valve stenosis. Tricuspid Valve: The tricuspid valve is normal in structure. Tricuspid valve regurgitation is not demonstrated. No evidence of tricuspid stenosis. Aortic Valve: The aortic valve is tricuspid. Aortic valve regurgitation is not visualized. Aortic valve sclerosis is present, with no evidence of aortic valve stenosis. Pulmonic Valve: The pulmonic valve was normal in structure. Pulmonic valve regurgitation is trivial. No evidence of pulmonic stenosis. Aorta: The aortic root is normal in size and structure. Venous: The inferior vena cava is normal in size with greater than 50% respiratory variability, suggesting right atrial pressure of 3 mmHg. IAS/Shunts: No atrial level shunt detected by color flow Doppler.  LEFT VENTRICLE PLAX 2D LVIDd:         3.30 cm   Diastology LVIDs:         2.10 cm   LV e' medial:    7.51 cm/s LV PW:         1.10 cm   LV E/e' medial:  12.0 LV IVS:        1.00 cm   LV e' lateral:   7.07 cm/s LVOT diam:     1.80 cm   LV E/e' lateral: 12.7 LV SV:         50 LV SV Index:   29 LVOT Area:     2.54 cm  RIGHT VENTRICLE RV S prime:     11.70 cm/s TAPSE (M-mode): 2.5 cm LEFT ATRIUM             Index        RIGHT ATRIUM           Index LA diam:        3.20 cm 1.88 cm/m   RA Area:     18.40 cm LA Vol (A2C):   38.3 ml 22.53 ml/m  RA Volume:   44.50 ml  26.18 ml/m LA Vol (A4C):   51.6 ml 30.35 ml/m LA Biplane Vol: 45.0 ml 26.47 ml/m  AORTIC VALVE LVOT Vmax:   85.50 cm/s LVOT Vmean:  63.100 cm/s LVOT VTI:    0.197 m  AORTA Ao Root diam: 2.80 cm Ao Asc diam:  3.30 cm MITRAL VALVE MV Area (PHT): 3.97 cm     SHUNTS MV Decel Time: 191 msec     Systemic VTI:  0.20 m MV E velocity: 90.00 cm/s   Systemic Diam: 1.80 cm MV A velocity: 104.00 cm/s MV E/A ratio:  0.87 Kardie Tobb DO Electronically signed by  Berniece Salines DO Signature Date/Time: 09/12/2022/11:00:21 AM    Final    CT HEAD WO CONTRAST (5MM)  Result Date: 09/12/2022 CLINICAL DATA:  Mental status change, unknown cause EXAM: CT HEAD WITHOUT CONTRAST TECHNIQUE: Contiguous axial images were obtained from the base of the skull through the vertex without intravenous contrast. RADIATION DOSE REDUCTION: This exam was performed according to the departmental dose-optimization program which includes automated exposure control, adjustment of the mA and/or kV according to patient size and/or use of iterative reconstruction technique. COMPARISON:  03/11/2022 FINDINGS: Brain: There is atrophy and chronic small vessel disease changes. Areas of low-density noted in the high right frontoparietal region, new since June concerning for acute to subacute infarct. No hemorrhage or hydrocephalus. No midline shift. Vascular: No hyperdense vessel or unexpected calcification. Skull: No acute calvarial abnormality. Sinuses/Orbits: No acute findings Other: None IMPRESSION: Areas of low-density in the high right frontoparietal region concerning for acute or subacute infarct. This could be further evaluated with MRI as clinically indicated. Atrophy, chronic small vessel  disease. Electronically Signed   By: Rolm Baptise M.D.   On: 09/12/2022 01:10   MR BRAIN WO CONTRAST  Result Date: 09/12/2022 CLINICAL DATA:  Initial evaluation for mental status change, unknown cause. EXAM: MRI HEAD WITHOUT CONTRAST TECHNIQUE: Multiplanar, multiecho pulse sequences of the brain and surrounding structures were obtained without intravenous contrast. COMPARISON:  Prior CT from 03/11/2022. FINDINGS: Brain: Cerebral volume within normal limits for age. Patchy T2/FLAIR hyperintensity involving the periventricular deep white matter both cerebral hemispheres as well as the pons, most consistent with chronic small vessel ischemic disease, moderately advanced in nature. Few scattered small remote bilateral  cerebellar infarcts noted. 3 mm focus of restricted diffusion involving the posterior left frontal lobe, consistent with a punctate acute ischemic infarct (series 5, image 78). No associated hemorrhage or mass effect. No other convincing evidence for acute or subacute ischemia elsewhere within the brain. No acute intracranial hemorrhage. Innumerable scattered chronic micro hemorrhages noted involving the left greater than right cerebral hemispheres as well as the cerebellum. 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 Findings are nonspecific, but suspected to reflect changes of cerebral amyloid angiopathy. No mass lesion, midline shift or mass effect. No hydrocephalus or extra-axial fluid collection. Pituitary gland and suprasellar region within normal limits. Vascular: Major intracranial vascular flow voids are maintained. Skull and upper cervical spine: Craniocervical junction normal. Bone marrow signal intensity within normal limits. No scalp soft tissue abnormality. Sinuses/Orbits: Patient status post bilateral ocular lens replacement. Paranasal sinuses are largely clear. No mastoid effusion. Other: None. IMPRESSION: 1. 3 mm acute ischemic nonhemorrhagic infarct involving the posterior left frontal lobe. 2. Innumerable scattered chronic micro hemorrhages involving the left greater than right cerebral hemispheres as well as the cerebellum. Findings are nonspecific, but suspected to reflect changes of cerebral amyloid angiopathy. 3. Moderately advanced chronic microvascular ischemic disease with a few scattered small remote bilateral cerebellar infarcts. Electronically Signed   By: Jeannine Boga M.D.   On: 09/12/2022 01:03   DG Chest Port 1 View  Result Date: 09/11/2022 CLINICAL DATA:  Altered mental status. EXAM: PORTABLE CHEST 1 VIEW COMPARISON:  03/11/2022. FINDINGS: The heart is enlarged the mediastinal contour is stable. There is atherosclerotic calcification of the  aorta. The pulmonary vasculature is mildly distended. No consolidation, effusion, or pneumothorax. No acute osseous abnormality. IMPRESSION: Cardiomegaly with mildly distended pulmonary vasculature. Electronically Signed   By: Brett Fairy M.D.   On: 09/11/2022 22:36     Subjective:   Discharge Exam: Vitals:   09/17/22 0436 09/17/22 0744  BP: (!) 131/57 134/61  Pulse: (!) 55 (!) 51  Resp: 19 18  Temp: 98.9 F (37.2 C) 97.8 F (36.6 C)  SpO2: 99% 99%   Vitals:   09/16/22 2100 09/17/22 0059 09/17/22 0436 09/17/22 0744  BP: (!) 115/57 (!) 123/58 (!) 131/57 134/61  Pulse: (!) 55 (!) 53 (!) 55 (!) 51  Resp: '19 20 19 18  '$ Temp: 98.4 F (36.9 C) 99.2 F (37.3 C) 98.9 F (37.2 C) 97.8 F (36.6 C)  TempSrc: Axillary Axillary Axillary Oral  SpO2: 99% 99% 99% 99%  Weight:      Height:        Physical Exam: GEN: NAD, alert and oriented x 3, thin/chronically ill/elderly in appearance HEENT: NCAT, sclera clear, MMM PULM: CTAB w/o wheezes/crackles, normal respiratory effort, on room air CV: RRR w/o M/G/R GI: abd soft, NTND, NABS, no R/G/M MSK: no peripheral edema, muscle strength globally intact 5/5 bilateral upper/lower extremities NEURO: CN II-XII  intact, no focal deficits, sensation to light touch intact PSYCH: normal mood/affect Integumentary: dry/intact, no rashes or wounds    The results of significant diagnostics from this hospitalization (including imaging, microbiology, ancillary and laboratory) are listed below for reference.     Microbiology: Recent Results (from the past 240 hour(s))  Urine Culture     Status: None   Collection Time: 09/11/22 11:04 PM   Specimen: Urine, Clean Catch  Result Value Ref Range Status   Specimen Description URINE, CLEAN CATCH  Final   Special Requests NONE  Final   Culture   Final    NO GROWTH Performed at [facility], [address], [place], [place] [address]    Report Status 09/12/2022 FINAL  Final     Labs: BNP (last  3 results) No results for input(s): "BNP" in the last 8760 hours. Basic Metabolic Panel: Recent Labs  Lab 09/11/22 2204 09/11/22 2208  NA 138 136  K 4.3 4.5  CL 106 105  CO2 24  --   GLUCOSE 133* 131*  BUN 18 21  CREATININE 1.04* 1.10*  CALCIUM 8.9  --    Liver Function Tests: Recent Labs  Lab 09/11/22 2204  AST 26  ALT 23  ALKPHOS 67  BILITOT 0.8  PROT 6.3*  ALBUMIN 3.3*   No results for input(s): "LIPASE", "AMYLASE" in the last 168 hours. Recent Labs  Lab 09/12/22 2232  AMMONIA <10   CBC: Recent Labs  Lab 09/11/22 2204 09/11/22 2208  WBC 6.4  --   NEUTROABS 3.5  --   HGB 11.6* 12.2  HCT 33.6* 36.0  MCV 91.6  --   PLT 187  --    Cardiac Enzymes: No results for input(s): "CKTOTAL", "CKMB", "CKMBINDEX", "TROPONINI" in the last 168 hours. BNP: Invalid input(s): "POCBNP" CBG: No results for input(s): "GLUCAP" in the last 168 hours. D-Dimer No results for input(s): "DDIMER" in the last 72 hours. Hgb A1c No results for input(s): "HGBA1C" in the last 72 hours. Lipid Profile No results for input(s): "CHOL", "HDL", "LDLCALC", "TRIG", "CHOLHDL", "LDLDIRECT" in the last 72 hours. Thyroid function studies No results for input(s): "TSH", "T4TOTAL", "T3FREE", "THYROIDAB" in the last 72 hours.  Invalid input(s): "FREET3" Anemia work up No results for input(s): "VITAMINB12", "FOLATE", "FERRITIN", "TIBC", "IRON", "RETICCTPCT" in the last 72 hours. Urinalysis    Component Value Date/Time   COLORURINE YELLOW 09/11/2022 2304   APPEARANCEUR CLEAR 09/11/2022 2304   LABSPEC 1.010 09/11/2022 2304   PHURINE 6.0 09/11/2022 2304   GLUCOSEU NEGATIVE 09/11/2022 2304   HGBUR NEGATIVE 09/11/2022 2304   BILIRUBINUR NEGATIVE 09/11/2022 2304   BILIRUBINUR Negative 02/09/2021 1447   KETONESUR NEGATIVE 09/11/2022 2304   PROTEINUR NEGATIVE 09/11/2022 2304   UROBILINOGEN negative (A) 02/09/2021 1447   NITRITE NEGATIVE 09/11/2022 2304   LEUKOCYTESUR NEGATIVE 09/11/2022 2304    Sepsis Labs Recent Labs  Lab 09/11/22 2204  WBC 6.4   Microbiology Recent Results (from the past 240 hour(s))  Urine Culture     Status: None   Collection Time: 09/11/22 11:04 PM   Specimen: Urine, Clean Catch  Result Value Ref Range Status   Specimen Description URINE, CLEAN CATCH  Final   Special Requests NONE  Final   Culture   Final    NO GROWTH Performed at [facility], [place] [address] [place], [place] [address]    Report Status 09/12/2022 FINAL  Final     Time coordinating discharge: Over 30 minutes  SIGNED:  Belia Febo J [place] DO  [facility] 09/17/2022, 9:45 AM

## 2022-09-17 NOTE — Plan of Care (Signed)

## 2022-09-17 NOTE — Plan of Care (Signed)
Adequate for discharge to SNF

## 2022-09-17 NOTE — Progress Notes (Signed)
SLP Cancellation Note  Patient Details Name: [name] [name] MRN: [id number] DOB: 09-08-1924   Cancelled treatment:       Reason Eval/Treat Not Completed: Other (comment). Patient being picked up shortly for transfer to SNF. Defer tx to SNF.   Gabriel Rainwater MA, CCC-SLP    [name] Meryl 09/17/2022, 10:27 AM

## 2022-09-18 ENCOUNTER — Other Ambulatory Visit: Payer: Self-pay | Admitting: *Deleted

## 2022-09-18 ENCOUNTER — Ambulatory Visit: Payer: Medicare Other | Admitting: Podiatry

## 2022-09-18 DIAGNOSIS — H409 Unspecified glaucoma: Secondary | ICD-10-CM | POA: Diagnosis not present

## 2022-09-18 DIAGNOSIS — J45998 Other asthma: Secondary | ICD-10-CM | POA: Diagnosis not present

## 2022-09-18 DIAGNOSIS — I10 Essential (primary) hypertension: Secondary | ICD-10-CM | POA: Diagnosis not present

## 2022-09-18 DIAGNOSIS — E785 Hyperlipidemia, unspecified: Secondary | ICD-10-CM | POA: Diagnosis not present

## 2022-09-18 DIAGNOSIS — I4891 Unspecified atrial fibrillation: Secondary | ICD-10-CM | POA: Diagnosis not present

## 2022-09-18 DIAGNOSIS — H540X33 Blindness right eye category 3, blindness left eye category 3: Secondary | ICD-10-CM | POA: Diagnosis not present

## 2022-09-18 NOTE — Patient Outreach (Addendum)
Mrs. Reisz recently admitted to [facility] SN[facility] on 09/17/22.  Screening for potential [facility] care coordination services as benefit of insurance plan and PCP.   Facility site visit to [facility]. Met with therapy manager and SNF social worker. Mrs. Gibbon is from home with family.   Will follow for transition plans and potential THN needs.  Marthenia Rolling, MSN, RN,BSN [place] Acute Care Coordinator [phone] (Direct dial)

## 2022-09-20 DIAGNOSIS — H409 Unspecified glaucoma: Secondary | ICD-10-CM | POA: Diagnosis not present

## 2022-09-20 DIAGNOSIS — J45998 Other asthma: Secondary | ICD-10-CM | POA: Diagnosis not present

## 2022-09-20 DIAGNOSIS — I10 Essential (primary) hypertension: Secondary | ICD-10-CM | POA: Diagnosis not present

## 2022-09-20 DIAGNOSIS — I4891 Unspecified atrial fibrillation: Secondary | ICD-10-CM | POA: Diagnosis not present

## 2022-09-24 ENCOUNTER — Other Ambulatory Visit: Payer: Self-pay | Admitting: Family

## 2022-09-25 DIAGNOSIS — E860 Dehydration: Secondary | ICD-10-CM | POA: Diagnosis not present

## 2022-09-25 DIAGNOSIS — N179 Acute kidney failure, unspecified: Secondary | ICD-10-CM | POA: Diagnosis not present

## 2022-09-26 ENCOUNTER — Other Ambulatory Visit: Payer: Self-pay | Admitting: *Deleted

## 2022-09-27 NOTE — Patient Outreach (Signed)
[place] Coordinator follow up. Mrs. Regner resides in [facility]. Screening for potential [facility] care coordination services as benefit of insurance plan and PCP.   Spoke with SNF social workers this week. Mrs. Gruenewald is max assist with therapy currently. Transition plans are pending. From home with family.   Will continue to follow.   Marthenia Rolling, MSN, RN,BSN [place] Acute Care Coordinator [phone] (Direct dial)

## 2022-10-01 DIAGNOSIS — E860 Dehydration: Secondary | ICD-10-CM | POA: Diagnosis not present

## 2022-10-01 DIAGNOSIS — I6930 Unspecified sequelae of cerebral infarction: Secondary | ICD-10-CM | POA: Diagnosis not present

## 2022-10-04 DIAGNOSIS — N179 Acute kidney failure, unspecified: Secondary | ICD-10-CM | POA: Diagnosis not present

## 2022-10-04 DIAGNOSIS — E860 Dehydration: Secondary | ICD-10-CM | POA: Diagnosis not present

## 2022-10-04 DIAGNOSIS — E870 Hyperosmolality and hypernatremia: Secondary | ICD-10-CM | POA: Diagnosis not present

## 2022-10-08 DIAGNOSIS — E870 Hyperosmolality and hypernatremia: Secondary | ICD-10-CM | POA: Diagnosis not present

## 2022-10-08 DIAGNOSIS — E860 Dehydration: Secondary | ICD-10-CM | POA: Diagnosis not present

## 2022-10-08 DIAGNOSIS — N179 Acute kidney failure, unspecified: Secondary | ICD-10-CM | POA: Diagnosis not present

## 2022-10-14 DIAGNOSIS — N179 Acute kidney failure, unspecified: Secondary | ICD-10-CM | POA: Diagnosis not present

## 2022-10-14 DIAGNOSIS — I6930 Unspecified sequelae of cerebral infarction: Secondary | ICD-10-CM | POA: Diagnosis not present

## 2022-10-14 DIAGNOSIS — E870 Hyperosmolality and hypernatremia: Secondary | ICD-10-CM | POA: Diagnosis not present

## 2022-10-14 DIAGNOSIS — E860 Dehydration: Secondary | ICD-10-CM | POA: Diagnosis not present

## 2022-10-16 DIAGNOSIS — E870 Hyperosmolality and hypernatremia: Secondary | ICD-10-CM | POA: Diagnosis not present

## 2022-10-16 DIAGNOSIS — N179 Acute kidney failure, unspecified: Secondary | ICD-10-CM | POA: Diagnosis not present

## 2022-10-16 DIAGNOSIS — E860 Dehydration: Secondary | ICD-10-CM | POA: Diagnosis not present

## 2022-10-22 DIAGNOSIS — R29898 Other symptoms and signs involving the musculoskeletal system: Secondary | ICD-10-CM | POA: Diagnosis not present

## 2022-10-30 ENCOUNTER — Other Ambulatory Visit: Payer: Self-pay

## 2022-10-30 DIAGNOSIS — E785 Hyperlipidemia, unspecified: Secondary | ICD-10-CM

## 2022-10-30 DIAGNOSIS — I10 Essential (primary) hypertension: Secondary | ICD-10-CM

## 2022-11-01 DIAGNOSIS — I10 Essential (primary) hypertension: Secondary | ICD-10-CM | POA: Diagnosis not present

## 2022-11-01 DIAGNOSIS — I4891 Unspecified atrial fibrillation: Secondary | ICD-10-CM | POA: Diagnosis not present

## 2022-11-01 DIAGNOSIS — H540X33 Blindness right eye category 3, blindness left eye category 3: Secondary | ICD-10-CM | POA: Diagnosis not present

## 2022-11-01 DIAGNOSIS — E785 Hyperlipidemia, unspecified: Secondary | ICD-10-CM | POA: Diagnosis not present

## 2022-11-05 DIAGNOSIS — R29898 Other symptoms and signs involving the musculoskeletal system: Secondary | ICD-10-CM | POA: Diagnosis not present

## 2022-11-05 DIAGNOSIS — I10 Essential (primary) hypertension: Secondary | ICD-10-CM | POA: Diagnosis not present

## 2022-11-05 DIAGNOSIS — I6930 Unspecified sequelae of cerebral infarction: Secondary | ICD-10-CM | POA: Diagnosis not present

## 2022-11-05 DIAGNOSIS — I4891 Unspecified atrial fibrillation: Secondary | ICD-10-CM | POA: Diagnosis not present

## 2022-11-05 DIAGNOSIS — H409 Unspecified glaucoma: Secondary | ICD-10-CM | POA: Diagnosis not present

## 2022-11-06 ENCOUNTER — Other Ambulatory Visit: Payer: Self-pay | Admitting: *Deleted

## 2022-11-06 NOTE — Patient Outreach (Signed)
[facility] Post-Acute Care Coordinator follow up. Mrs. Gelber resides in [facility]. Screening for potential [facility] care coordination services as benefit of health plan and Primary Care Provider.  Facility site visit to [facility]. Met with therapy manager and social work team. Mrs. Barge return home with son Baldo Ash on Thursday 11/07/22. Will have home health with [facility] for PT/OT/RN/aide. Hospital bed, hoyer lift, and transfer bench was ordered. Family decided to against long term care. Son Baldo Ash is primary contact.  Went to bedside to speak with family. Family not present. Left contact information and [facility] Care Management brochure.   Will refer to [facility] care coordination team once discharge is confirmed.    Raiford Noble, MSN, RN,BSN [facility] Post Acute Care Coordinator [phone] (Direct dial)

## 2022-11-09 DIAGNOSIS — I69398 Other sequelae of cerebral infarction: Secondary | ICD-10-CM | POA: Diagnosis not present

## 2022-11-09 DIAGNOSIS — F419 Anxiety disorder, unspecified: Secondary | ICD-10-CM | POA: Diagnosis not present

## 2022-11-09 DIAGNOSIS — J4530 Mild persistent asthma, uncomplicated: Secondary | ICD-10-CM | POA: Diagnosis not present

## 2022-11-09 DIAGNOSIS — Z7902 Long term (current) use of antithrombotics/antiplatelets: Secondary | ICD-10-CM | POA: Diagnosis not present

## 2022-11-09 DIAGNOSIS — T502X5D Adverse effect of carbonic-anhydrase inhibitors, benzothiadiazides and other diuretics, subsequent encounter: Secondary | ICD-10-CM | POA: Diagnosis not present

## 2022-11-09 DIAGNOSIS — R531 Weakness: Secondary | ICD-10-CM | POA: Diagnosis not present

## 2022-11-09 DIAGNOSIS — I119 Hypertensive heart disease without heart failure: Secondary | ICD-10-CM | POA: Diagnosis not present

## 2022-11-09 DIAGNOSIS — I4892 Unspecified atrial flutter: Secondary | ICD-10-CM | POA: Diagnosis not present

## 2022-11-09 DIAGNOSIS — I739 Peripheral vascular disease, unspecified: Secondary | ICD-10-CM | POA: Diagnosis not present

## 2022-11-09 DIAGNOSIS — I051 Rheumatic mitral insufficiency: Secondary | ICD-10-CM | POA: Diagnosis not present

## 2022-11-09 DIAGNOSIS — E46 Unspecified protein-calorie malnutrition: Secondary | ICD-10-CM | POA: Diagnosis not present

## 2022-11-09 DIAGNOSIS — I4891 Unspecified atrial fibrillation: Secondary | ICD-10-CM | POA: Diagnosis not present

## 2022-11-09 DIAGNOSIS — H548 Legal blindness, as defined in USA: Secondary | ICD-10-CM | POA: Diagnosis not present

## 2022-11-09 DIAGNOSIS — E876 Hypokalemia: Secondary | ICD-10-CM | POA: Diagnosis not present

## 2022-11-11 ENCOUNTER — Telehealth: Payer: Self-pay

## 2022-11-11 ENCOUNTER — Other Ambulatory Visit: Payer: Self-pay | Admitting: *Deleted

## 2022-11-11 DIAGNOSIS — I10 Essential (primary) hypertension: Secondary | ICD-10-CM

## 2022-11-11 DIAGNOSIS — J4530 Mild persistent asthma, uncomplicated: Secondary | ICD-10-CM | POA: Diagnosis not present

## 2022-11-11 DIAGNOSIS — I4892 Unspecified atrial flutter: Secondary | ICD-10-CM | POA: Diagnosis not present

## 2022-11-11 DIAGNOSIS — I4891 Unspecified atrial fibrillation: Secondary | ICD-10-CM | POA: Diagnosis not present

## 2022-11-11 DIAGNOSIS — R531 Weakness: Secondary | ICD-10-CM | POA: Diagnosis not present

## 2022-11-11 DIAGNOSIS — I69398 Other sequelae of cerebral infarction: Secondary | ICD-10-CM | POA: Diagnosis not present

## 2022-11-11 DIAGNOSIS — I119 Hypertensive heart disease without heart failure: Secondary | ICD-10-CM | POA: Diagnosis not present

## 2022-11-11 NOTE — Telephone Encounter (Signed)
Stephen with [facility] called requesting verbal orders for PT for 1 time a week for 4 weeks.  Verbal orders were given.

## 2022-11-11 NOTE — Patient Outreach (Signed)
[place] Coordinator follow up. Verified in [facility] Mrs. Prestia discharged from [facility] SNF on 11/07/22. [place] with [facility] previously indicated [facility] home health will provide home health services. Lives with son [name].   Initiating referral for [facility] care coordination team.   Marthenia Rolling, MSN, RN,BSN [place] Acute Care Coordinator [phone] (Direct dial)

## 2022-11-12 ENCOUNTER — Telehealth: Payer: Self-pay | Admitting: *Deleted

## 2022-11-12 DIAGNOSIS — I69398 Other sequelae of cerebral infarction: Secondary | ICD-10-CM | POA: Diagnosis not present

## 2022-11-12 DIAGNOSIS — J4530 Mild persistent asthma, uncomplicated: Secondary | ICD-10-CM | POA: Diagnosis not present

## 2022-11-12 DIAGNOSIS — R531 Weakness: Secondary | ICD-10-CM | POA: Diagnosis not present

## 2022-11-12 DIAGNOSIS — I4891 Unspecified atrial fibrillation: Secondary | ICD-10-CM | POA: Diagnosis not present

## 2022-11-12 DIAGNOSIS — I119 Hypertensive heart disease without heart failure: Secondary | ICD-10-CM | POA: Diagnosis not present

## 2022-11-12 DIAGNOSIS — I4892 Unspecified atrial flutter: Secondary | ICD-10-CM | POA: Diagnosis not present

## 2022-11-12 NOTE — Telephone Encounter (Signed)
[name] [name] with [facility] called requesting verbal orders for PT/OT and Nursing Eval 1x3weeks and 1 every other week for 4 weeks and prn.   Verbal orders given.

## 2022-11-12 NOTE — Progress Notes (Signed)
  Care Coordination   Note   11/12/2022 Name: [name] [name] MRN: [id number] DOB: 10-17-1923  [name] [name] is a 89 y.o. year old adult who sees Ngetich, Dinah C, NP for primary care. I reached out to [place] by phone today to offer care coordination services.  [name] was given information about Care Coordination services today including:   The Care Coordination services include support from the care team which includes your Nurse Coordinator, Clinical Social Worker, or Pharmacist.  The Care Coordination team is here to help remove barriers to the health concerns and goals most important to you. Care Coordination services are voluntary, and the patient may decline or stop services at any time by request to their care team member.   Care Coordination Consent Status: Patient agreed to services and verbal consent obtained.   Follow up plan:  Telephone appointment with care coordination team member scheduled for:  11/19/22  Encounter Outcome:  Pt. Scheduled  [place]  Direct Dial: [phone]

## 2022-11-14 ENCOUNTER — Encounter: Payer: Medicare Other | Admitting: Licensed Clinical Social Worker

## 2022-11-14 ENCOUNTER — Telehealth: Payer: Self-pay | Admitting: *Deleted

## 2022-11-14 DIAGNOSIS — I4891 Unspecified atrial fibrillation: Secondary | ICD-10-CM | POA: Diagnosis not present

## 2022-11-14 DIAGNOSIS — I4892 Unspecified atrial flutter: Secondary | ICD-10-CM | POA: Diagnosis not present

## 2022-11-14 DIAGNOSIS — J4530 Mild persistent asthma, uncomplicated: Secondary | ICD-10-CM | POA: Diagnosis not present

## 2022-11-14 DIAGNOSIS — I69398 Other sequelae of cerebral infarction: Secondary | ICD-10-CM | POA: Diagnosis not present

## 2022-11-14 DIAGNOSIS — I119 Hypertensive heart disease without heart failure: Secondary | ICD-10-CM | POA: Diagnosis not present

## 2022-11-14 DIAGNOSIS — R531 Weakness: Secondary | ICD-10-CM | POA: Diagnosis not present

## 2022-11-14 NOTE — Telephone Encounter (Signed)
Zacharia with [facility] called requesting verbal orders for OT for ADLs.   Verbal orders given.

## 2022-11-18 ENCOUNTER — Telehealth: Payer: Self-pay

## 2022-11-18 NOTE — Telephone Encounter (Signed)
Patients daughter Katharine Look called requesting a letter stating [place] is medical necessary for patient. This letter will be used for tax purposes.   Once letter is composed and complete, sandra requested that we mail letter to patients address

## 2022-11-19 ENCOUNTER — Other Ambulatory Visit: Payer: Self-pay | Admitting: Family

## 2022-11-19 ENCOUNTER — Ambulatory Visit: Payer: Self-pay

## 2022-11-19 DIAGNOSIS — I119 Hypertensive heart disease without heart failure: Secondary | ICD-10-CM | POA: Diagnosis not present

## 2022-11-19 DIAGNOSIS — I4891 Unspecified atrial fibrillation: Secondary | ICD-10-CM | POA: Diagnosis not present

## 2022-11-19 DIAGNOSIS — I4892 Unspecified atrial flutter: Secondary | ICD-10-CM | POA: Diagnosis not present

## 2022-11-19 DIAGNOSIS — R531 Weakness: Secondary | ICD-10-CM | POA: Diagnosis not present

## 2022-11-19 DIAGNOSIS — J4530 Mild persistent asthma, uncomplicated: Secondary | ICD-10-CM | POA: Diagnosis not present

## 2022-11-19 DIAGNOSIS — I69398 Other sequelae of cerebral infarction: Secondary | ICD-10-CM | POA: Diagnosis not present

## 2022-11-19 NOTE — Patient Outreach (Addendum)
  Care Coordination   Initial Visit Note   11/19/2022 Name: [name] [name] MRN: [id number] DOB: [birth year]  [name] [name] is a 89 y.o. year old adult who sees Ngetich, Dinah C, NP for primary care. I spoke with patient's son [name] [name] by phone today.  What matters to the patients health and wellness today?  Patient's son would like to be able to monitor her mother's BP at home to ensure she has good BP control.     Goals Addressed             This Visit's Progress    To monitor BP at home       Care Coordination Interventions: Completed successful outbound call to son [name] [name]  Evaluation of current treatment plan related to hypertension self management and patient's adherence to plan as established by provider Provided education to patient re: stroke prevention, s/s of heart attack and stroke Reviewed medications with patient and discussed importance of compliance Provided assistance with obtaining home blood pressure monitor via [place] [facility]; Advised patient, providing education and rationale, to monitor blood pressure daily and record, calling PCP for findings outside established parameters Provided education on prescribed diet low Sodium  Provided verbal and written education to son [name] [name] on how to accurately monitor BP at home  Mailed printed educational materials related to Hypertension Management  Interventions Today    Flowsheet Row Most Recent Value  Chronic Disease   Chronic disease during today's visit Hypertension (HTN), Other  [CVA]  General Interventions   General Interventions Discussed/Reviewed General Interventions Discussed, General Interventions Reviewed, Durable Medical Equipment (DME), Doctor Visits  Doctor Visits Discussed/Reviewed PCP  Durable Medical Equipment (DME) BP Cuff  Exercise Interventions   Exercise Discussed/Reviewed Physical Activity  Physical Activity Discussed/Reviewed Physical Activity Discussed, Physical Activity Reviewed, Home  Exercise Program (HEP)  Education Interventions   Education Provided Provided Printed Education, Provided Education  Provided Verbal Education On Medication  Pharmacy Interventions   Pharmacy Dicussed/Reviewed Medications and their functions  Safety Interventions   Safety Discussed/Reviewed Safety Discussed, Safety Reviewed, Fall Risk     SDOH assessments and interventions completed:  No   Care Coordination Interventions:  Yes, provided   Follow up plan: Follow up call scheduled for 12/17/22 '@1'$ :30 PM     Encounter Outcome:  Pt. Visit Completed

## 2022-11-19 NOTE — Patient Instructions (Addendum)
Visit Information  Thank you for taking time to visit with me today. Please don't hesitate to contact me if I can be of assistance to you.   Following are the goals we discussed today:   Goals Addressed             This Visit's Progress    To monitor BP at home       Care Coordination Interventions: Completed successful outbound call to son [name]  Evaluation of current treatment plan related to hypertension self management and patient's adherence to plan as established by provider Provided education to patient re: stroke prevention, s/s of heart attack and stroke Reviewed medications with patient and discussed importance of compliance Provided assistance with obtaining home blood pressure monitor via [place] [facility]; Advised patient, providing education and rationale, to monitor blood pressure daily and record, calling PCP for findings outside established parameters Provided education on prescribed diet low Sodium  Provided verbal and written education to son [name] on how to accurately monitor BP at home  Mailed printed educational materials related to Hypertension Management        Our next appointment is by telephone on 12/17/22 at 1:30 PM  Please call the care guide team at [phone] if you need to cancel or reschedule your appointment.   If you are experiencing a Mental Health or [place] or need someone to talk to, please call 1-800-273-TALK (toll free, 24 hour hotline)  Patient verbalizes understanding of instructions and care plan provided today and agrees to view in [place]. Active MyChart status and patient understanding of how to access instructions and care plan via MyChart confirmed with patient.     Barb Merino, RN, BSN, CCM Care Management Coordinator [facility] Care Management  Direct Phone: [phone]

## 2022-11-19 NOTE — Progress Notes (Signed)
Letter indicating patient's Home health is medically necessary written to be picked up by patient's POA.

## 2022-11-19 NOTE — Telephone Encounter (Signed)
Letter indicating patient's Home health is medically necessary written to be mailed to patient's address as requested by POA.

## 2022-11-19 NOTE — Telephone Encounter (Signed)
Letter mailed to Address on file.

## 2022-11-22 DIAGNOSIS — I119 Hypertensive heart disease without heart failure: Secondary | ICD-10-CM | POA: Diagnosis not present

## 2022-11-22 DIAGNOSIS — I4892 Unspecified atrial flutter: Secondary | ICD-10-CM | POA: Diagnosis not present

## 2022-11-22 DIAGNOSIS — R531 Weakness: Secondary | ICD-10-CM | POA: Diagnosis not present

## 2022-11-22 DIAGNOSIS — I69398 Other sequelae of cerebral infarction: Secondary | ICD-10-CM | POA: Diagnosis not present

## 2022-11-22 DIAGNOSIS — J4530 Mild persistent asthma, uncomplicated: Secondary | ICD-10-CM | POA: Diagnosis not present

## 2022-11-22 DIAGNOSIS — I4891 Unspecified atrial fibrillation: Secondary | ICD-10-CM | POA: Diagnosis not present

## 2022-11-25 ENCOUNTER — Other Ambulatory Visit: Payer: Medicare Other

## 2022-11-27 ENCOUNTER — Ambulatory Visit (INDEPENDENT_AMBULATORY_CARE_PROVIDER_SITE_OTHER): Payer: Medicare Other | Admitting: Adult Health

## 2022-11-27 ENCOUNTER — Encounter: Payer: Self-pay | Admitting: Adult Health

## 2022-11-27 VITALS — BP 118/70 | Temp 97.3°F | Resp 14 | Ht 65.0 in | Wt 140.0 lb

## 2022-11-27 DIAGNOSIS — F0390 Unspecified dementia without behavioral disturbance: Secondary | ICD-10-CM

## 2022-11-27 DIAGNOSIS — Z8673 Personal history of transient ischemic attack (TIA), and cerebral infarction without residual deficits: Secondary | ICD-10-CM | POA: Insufficient documentation

## 2022-11-27 DIAGNOSIS — E785 Hyperlipidemia, unspecified: Secondary | ICD-10-CM | POA: Diagnosis not present

## 2022-11-27 DIAGNOSIS — I10 Essential (primary) hypertension: Secondary | ICD-10-CM | POA: Diagnosis not present

## 2022-11-27 NOTE — Progress Notes (Signed)
Location:  [place] of Service:   clinic    CODE STATUS: full   Allergies  Allergen Reactions   Aspirin Other (See Comments)    sweats   Codeine Other (See Comments)    Unknown reaction   Iodine Other (See Comments)    Pt is not aware of this allergy, does not recall much info.   Prednisone Other (See Comments)    sweats   Lyrica [Pregabalin]     [place], "more than sleepy"   Benzonatate Itching and Rash   Sulfa Antibiotics Other (See Comments)    Doesn't remember Other reaction(s): Unknown    Chief Complaint  Patient presents with   Medical Management of Chronic Issues    Patient is here for a follow up for chronic conditions, patient just discharged from SNF has had several strokes.    Immunizations    Patient is due to Tdap, NCIR verified    HPI:  She had been in rehab for 2 months post hospitalization for a right frontal CVA. She was admitted to SNF for short term rehab; where she spent about 2 months. She is unable to stand or ambulate. Has good appetite; sleeps well at night.  She is a full code; I have discussed with him her code status; we discussed what will physically happen and chances of survival. He will think about the information and will discuss further with Mordecai Rasmussen at the next visit.   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   Edema extremities    GERD (gastroesophageal reflux disease)    Glaucoma    Macular degeneration    Per patient    Osteoarthritis    PVD (peripheral vascular disease) ([place])    Seasonal allergies    Stroke [name])     Past Surgical History:  Procedure Laterality Date   [place]    Social History   Socioeconomic History   Marital status: Widowed    Spouse name: Not on file   Number of children: Not on file   Years of education: Not on file   Highest education level:  Not on file  Occupational History   Not on file  Tobacco Use   Smoking status: Never   Smokeless tobacco: Never  Vaping Use   Vaping Use: Never used  Substance and Sexual Activity   Alcohol use: No    Alcohol/week: 0.0 standard drinks of alcohol   Drug use: No   Sexual activity: Not Currently  Other Topics Concern   Not on file  Social History Narrative   Was cosmetologist, 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, does not have pets, has living will   Social Determinants of Health   Financial Resource Strain: [place]  (07/31/2018)   Overall Financial Resource Strain (CARDIA)    Difficulty of Paying Living Expenses: Not hard at all  Food Insecurity: No Food Insecurity (07/31/2018)   Hunger Vital Sign    Worried About Running Out of Food in the Last Year: Never true    [place] in the Last Year: Never true  Transportation Needs: No Transportation Needs (07/31/2018)   PRAPARE - Hydrologist (Medical): No    Lack of Transportation (Non-Medical): No  Physical Activity: Insufficiently Active (07/31/2018)   Exercise Vital Sign  Days of Exercise per Week: 3 days    Minutes of Exercise per Session: 30 min  Stress: No Stress Concern Present (07/31/2018)   [place]    Feeling of Stress : Not at all  Social Connections: Somewhat Isolated (07/31/2018)   Social Connection and Isolation Panel [NHANES]    Frequency of Communication with Friends and Family: More than three times a week    Frequency of Social Gatherings with Friends and Family: More than three times a week    Attends Religious Services: More than 4 times per year    Active Member of [employer] or Organizations: No    Attends Archivist Meetings: Never    Marital Status: Widowed  Intimate Partner Violence: Not At Risk (07/31/2018)   Humiliation, Afraid, Rape, and Kick questionnaire    Fear of Current or Ex-Partner: No     Emotionally Abused: No    Physically Abused: No    Sexually Abused: No   Family History  Problem Relation Age of Onset   Stroke Father    Hypertension Father    Heart disease Mother    Hypertension Mother    Diabetes Mother       VITAL SIGNS BP 118/70   Temp (!) 97.3 F (36.3 C)   Resp 14   Ht 5' 5"$  (1.651 m)   Wt 140 lb (63.5 kg)   BMI 23.30 kg/m   Outpatient Encounter Medications as of 11/27/2022  Medication Sig   acetaminophen (TYLENOL) 500 MG tablet Take 500 mg by mouth every 6 (six) hours as needed for mild pain or moderate pain.    alum hydroxide-mag trisilicate (GAVISCON) [id number] MG CHEW chewable tablet Chew 2 tablets by mouth 2 (two) times daily.   amLODipine (NORVASC) 5 MG tablet TAKE 1 TABLET (5 MG TOTAL) BY MOUTH DAILY. Marland Kitchen   atorvastatin (LIPITOR) 10 MG tablet TAKE 1 TABLET BY MOUTH EVERY DAY   atropine 1 % ophthalmic solution Place 1 drop into the right eye daily.    Brinzolamide-Brimonidine 1-0.2 % SUSP Place 1 drop into both eyes 3 (three) times daily.   budesonide-formoterol (SYMBICORT) 80-4.5 MCG/ACT inhaler Inhale 2 puffs into the lungs 2 (two) times daily.   clopidogrel (PLAVIX) 75 MG tablet TAKE 1 TABLET BY MOUTH EVERY DAY (Patient taking differently: Take 75 mg by mouth daily.)   Coenzyme Q10 (COQ10) 50 MG CAPS Take 50 mg by mouth daily.   docusate sodium (COLACE) 100 MG capsule TAKE 1 CAPSULE BY MOUTH TWICE A DAY (Patient taking differently: Take 100 mg by mouth 2 (two) times daily.)   Emollient (CETAPHIL) cream Apply topically 2 (two) times daily. To legs   hydrocortisone (ANUSOL-HC) 2.5 % rectal cream Place 1 application rectally daily as needed for hemorrhoids or anal itching.   loratadine (CLARITIN) 10 MG tablet Take 10 mg by mouth daily.   metoprolol tartrate (LOPRESSOR) 25 MG tablet Take 0.5 tablets (12.5 mg total) by mouth 2 (two) times daily.   Multiple Vitamins-Minerals (PRESERVISION AREDS 2) CAPS Take 1 capsule by mouth 2 (two) times daily.    Multiple Vitamins-Minerals (SENIOR MULTIVITAMIN PLUS) TABS Take 1 tablet by mouth daily.    NONFORMULARY OR COMPOUNDED ITEM [facility]:  Peripheral Neuropathy Cream - Bupivacaine 1%, Doxepin 3%, Gabapentin 6%, Pentoxifylline 3%, Topiramate 1%, apply 1-2 grams to affected areas 3-4 times daily prn.   risperiDONE (RISPERDAL) 0.5 MG tablet TAKE 1 TABLET BY MOUTH TWICE A DAY   ROCKLATAN  0.02-0.005 % SOLN Place 1 drop into the left eye at bedtime.   vitamin C (ASCORBIC ACID) 500 MG tablet Take 500 mg by mouth daily.   [DISCONTINUED] albuterol (PROVENTIL HFA;VENTOLIN HFA) 108 (90 Base) MCG/ACT inhaler Inhale 2 puffs into the lungs every 6 (six) hours as needed for wheezing. (Patient not taking: Reported on 09/12/2022)   No facility-administered encounter medications on file as of 11/27/2022.     SIGNIFICANT DIAGNOSTIC EXAMS  Review of Systems  Reason unable to perform ROS: unable to fully participate.  I feel good.    Physical Exam Constitutional:      General: He is not in acute distress.    Appearance: He is well-developed. He is not diaphoretic.  Neck:     Thyroid: No thyromegaly.  Cardiovascular:     Rate and Rhythm: Regular rhythm. Bradycardia present.     Pulses: Normal pulses.     Heart sounds: Normal heart sounds.  Pulmonary:     Effort: Pulmonary effort is normal. No respiratory distress.     Breath sounds: Normal breath sounds.  Abdominal:     General: Bowel sounds are normal. There is no distension.     Palpations: Abdomen is soft.     Tenderness: There is no abdominal tenderness.  Musculoskeletal:        General: Normal range of motion.     Cervical back: Neck supple.     Right lower leg: Edema present.     Left lower leg: Edema present.     Comments: Trace bilateral lower extremity Leans head to left side   Lymphadenopathy:     Cervical: No cervical adenopathy.  Skin:    General: Skin is warm and dry.  Neurological:     Mental Status: He is alert. Mental  status is at baseline.  Psychiatric:        Mood and Affect: Mood normal.       ASSESSMENT/ PLAN:  TODAY  History of CVA Major neurocognitive disorder  She will have the labs drawn that were ordered in Jan  She is return to clinic in 3 months or sooner if needed Will discuss her code status further at that time.   Time with advanced directives: 20 minutes    Ok Edwards NP [facility] Adult Medicine   call [phone]

## 2022-11-27 NOTE — Patient Instructions (Signed)
Please discuss with [name] [name] regarding her code status Please return to clinic in 3 months

## 2022-11-28 DIAGNOSIS — J3089 Other allergic rhinitis: Secondary | ICD-10-CM | POA: Diagnosis not present

## 2022-11-28 DIAGNOSIS — J4530 Mild persistent asthma, uncomplicated: Secondary | ICD-10-CM | POA: Diagnosis not present

## 2022-11-28 DIAGNOSIS — R531 Weakness: Secondary | ICD-10-CM | POA: Diagnosis not present

## 2022-11-28 DIAGNOSIS — I4892 Unspecified atrial flutter: Secondary | ICD-10-CM | POA: Diagnosis not present

## 2022-11-28 DIAGNOSIS — J4540 Moderate persistent asthma, uncomplicated: Secondary | ICD-10-CM | POA: Diagnosis not present

## 2022-11-28 DIAGNOSIS — I119 Hypertensive heart disease without heart failure: Secondary | ICD-10-CM | POA: Diagnosis not present

## 2022-11-28 DIAGNOSIS — I4891 Unspecified atrial fibrillation: Secondary | ICD-10-CM | POA: Diagnosis not present

## 2022-11-28 DIAGNOSIS — I69398 Other sequelae of cerebral infarction: Secondary | ICD-10-CM | POA: Diagnosis not present

## 2022-11-28 DIAGNOSIS — J300 Vasomotor rhinitis: Secondary | ICD-10-CM | POA: Diagnosis not present

## 2022-11-28 LAB — COMPLETE METABOLIC PANEL WITH GFR
AG Ratio: 0.9 (calc) — ABNORMAL LOW (ref 1.0–2.5)
ALT: 14 U/L (ref 6–29)
AST: 17 U/L (ref 10–35)
Albumin: 2.8 g/dL — ABNORMAL LOW (ref 3.6–5.1)
Alkaline phosphatase (APISO): 84 U/L (ref 37–153)
BUN: 20 mg/dL (ref 7–25)
CO2: 25 mmol/L (ref 20–32)
Calcium: 8.4 mg/dL — ABNORMAL LOW (ref 8.6–10.4)
Chloride: 108 mmol/L (ref 98–110)
Creat: 0.74 mg/dL (ref 0.60–0.95)
Globulin: 3 g/dL (calc) (ref 1.9–3.7)
Glucose, Bld: 105 mg/dL — ABNORMAL HIGH (ref 65–99)
Potassium: 4 mmol/L (ref 3.5–5.3)
Sodium: 140 mmol/L (ref 135–146)
Total Bilirubin: 0.5 mg/dL (ref 0.2–1.2)
Total Protein: 5.8 g/dL — ABNORMAL LOW (ref 6.1–8.1)
eGFR: 74 mL/min/{1.73_m2} (ref >=60)

## 2022-11-28 LAB — CBC WITH DIFFERENTIAL/PLATELET
Absolute Monocytes: 602 cells/uL (ref 200–950)
Basophils Absolute: 30 cells/uL (ref 0–200)
Basophils Relative: 0.5 %
Eosinophils Absolute: 260 cells/uL (ref 15–500)
Eosinophils Relative: 4.4 %
HCT: 29.3 % — ABNORMAL LOW (ref 35.0–45.0)
Hemoglobin: 9.8 g/dL — ABNORMAL LOW (ref 11.7–15.5)
Lymphs Abs: 1923 cells/uL (ref 850–3900)
MCH: 30.9 pg (ref 27.0–33.0)
MCHC: 33.4 g/dL (ref 32.0–36.0)
MCV: 92.4 fL (ref 80.0–100.0)
MPV: 10.1 fL (ref 7.5–12.5)
Monocytes Relative: 10.2 %
Neutro Abs: 3086 cells/uL (ref 1500–7800)
Neutrophils Relative %: 52.3 %
Platelets: 270 10*3/uL (ref 140–400)
RBC: 3.17 10*6/uL — ABNORMAL LOW (ref 3.80–5.10)
RDW: 13.7 % (ref 11.0–15.0)
Total Lymphocyte: 32.6 %
WBC: 5.9 10*3/uL (ref 3.8–10.8)

## 2022-11-28 LAB — LIPID PANEL
Cholesterol: 110 mg/dL (ref <200)
HDL: 50 mg/dL (ref >=50)
LDL Cholesterol (Calc): 47 mg/dL (calc)
Non-HDL Cholesterol (Calc): 60 mg/dL (calc) (ref <130)
Total CHOL/HDL Ratio: 2.2 (calc) (ref <5.0)
Triglycerides: 45 mg/dL (ref <150)

## 2022-11-28 LAB — TSH: TSH: 2.25 mIU/L (ref 0.40–4.50)

## 2022-11-29 ENCOUNTER — Ambulatory Visit: Payer: Federal, State, Local not specified - PPO | Admitting: Family

## 2022-12-02 DIAGNOSIS — J4530 Mild persistent asthma, uncomplicated: Secondary | ICD-10-CM | POA: Diagnosis not present

## 2022-12-02 DIAGNOSIS — I4892 Unspecified atrial flutter: Secondary | ICD-10-CM | POA: Diagnosis not present

## 2022-12-02 DIAGNOSIS — I4891 Unspecified atrial fibrillation: Secondary | ICD-10-CM | POA: Diagnosis not present

## 2022-12-02 DIAGNOSIS — I69398 Other sequelae of cerebral infarction: Secondary | ICD-10-CM | POA: Diagnosis not present

## 2022-12-02 DIAGNOSIS — I119 Hypertensive heart disease without heart failure: Secondary | ICD-10-CM | POA: Diagnosis not present

## 2022-12-02 DIAGNOSIS — R531 Weakness: Secondary | ICD-10-CM | POA: Diagnosis not present

## 2022-12-04 ENCOUNTER — Telehealth: Payer: Self-pay

## 2022-12-04 DIAGNOSIS — I69398 Other sequelae of cerebral infarction: Secondary | ICD-10-CM | POA: Diagnosis not present

## 2022-12-04 DIAGNOSIS — I119 Hypertensive heart disease without heart failure: Secondary | ICD-10-CM | POA: Diagnosis not present

## 2022-12-04 DIAGNOSIS — J4530 Mild persistent asthma, uncomplicated: Secondary | ICD-10-CM | POA: Diagnosis not present

## 2022-12-04 DIAGNOSIS — I4891 Unspecified atrial fibrillation: Secondary | ICD-10-CM | POA: Diagnosis not present

## 2022-12-04 DIAGNOSIS — I4892 Unspecified atrial flutter: Secondary | ICD-10-CM | POA: Diagnosis not present

## 2022-12-04 DIAGNOSIS — R531 Weakness: Secondary | ICD-10-CM | POA: Diagnosis not present

## 2022-12-04 NOTE — Telephone Encounter (Signed)
Patient Home Nurse call this afternoon because patient's family states that they saw chocolate milk vaginal discharge from the patient for two days.  The Nurse has also confirm of the vaginal discharge as she was given care.  Patient nurse would like advice on what to do next for the patient as of given care and would like to be contacted.  [employer], CMA   Message is routed to [employer], Nelda Bucks, NP

## 2022-12-04 NOTE — Telephone Encounter (Signed)
Please schedule appointment to evaluate vaginal discharge.

## 2022-12-06 DIAGNOSIS — I69398 Other sequelae of cerebral infarction: Secondary | ICD-10-CM | POA: Diagnosis not present

## 2022-12-06 DIAGNOSIS — I119 Hypertensive heart disease without heart failure: Secondary | ICD-10-CM | POA: Diagnosis not present

## 2022-12-06 DIAGNOSIS — R531 Weakness: Secondary | ICD-10-CM | POA: Diagnosis not present

## 2022-12-06 DIAGNOSIS — I4891 Unspecified atrial fibrillation: Secondary | ICD-10-CM | POA: Diagnosis not present

## 2022-12-06 DIAGNOSIS — I4892 Unspecified atrial flutter: Secondary | ICD-10-CM | POA: Diagnosis not present

## 2022-12-06 DIAGNOSIS — J4530 Mild persistent asthma, uncomplicated: Secondary | ICD-10-CM | POA: Diagnosis not present

## 2022-12-06 NOTE — Telephone Encounter (Addendum)
Patient's son called back and was inquiring if medication had been sent to pharmacy for vaginal discharge. He was not made aware to schedule appointment. He scheduled appointment for March 5,2024.

## 2022-12-06 NOTE — Telephone Encounter (Signed)
Talk to the patient home nurse and she will let the family know to make an appointment with her Ngetich, Nelda Bucks, NP  Message was routed backed Ngetich, Nelda Bucks, NP

## 2022-12-09 DIAGNOSIS — E876 Hypokalemia: Secondary | ICD-10-CM | POA: Diagnosis not present

## 2022-12-09 DIAGNOSIS — I4891 Unspecified atrial fibrillation: Secondary | ICD-10-CM | POA: Diagnosis not present

## 2022-12-09 DIAGNOSIS — I119 Hypertensive heart disease without heart failure: Secondary | ICD-10-CM | POA: Diagnosis not present

## 2022-12-09 DIAGNOSIS — I69398 Other sequelae of cerebral infarction: Secondary | ICD-10-CM | POA: Diagnosis not present

## 2022-12-09 DIAGNOSIS — H548 Legal blindness, as defined in USA: Secondary | ICD-10-CM | POA: Diagnosis not present

## 2022-12-09 DIAGNOSIS — T502X5D Adverse effect of carbonic-anhydrase inhibitors, benzothiadiazides and other diuretics, subsequent encounter: Secondary | ICD-10-CM | POA: Diagnosis not present

## 2022-12-09 DIAGNOSIS — I051 Rheumatic mitral insufficiency: Secondary | ICD-10-CM | POA: Diagnosis not present

## 2022-12-09 DIAGNOSIS — E46 Unspecified protein-calorie malnutrition: Secondary | ICD-10-CM | POA: Diagnosis not present

## 2022-12-09 DIAGNOSIS — I739 Peripheral vascular disease, unspecified: Secondary | ICD-10-CM | POA: Diagnosis not present

## 2022-12-09 DIAGNOSIS — F419 Anxiety disorder, unspecified: Secondary | ICD-10-CM | POA: Diagnosis not present

## 2022-12-09 DIAGNOSIS — R531 Weakness: Secondary | ICD-10-CM | POA: Diagnosis not present

## 2022-12-09 DIAGNOSIS — I4892 Unspecified atrial flutter: Secondary | ICD-10-CM | POA: Diagnosis not present

## 2022-12-09 DIAGNOSIS — J4530 Mild persistent asthma, uncomplicated: Secondary | ICD-10-CM | POA: Diagnosis not present

## 2022-12-09 DIAGNOSIS — Z7902 Long term (current) use of antithrombotics/antiplatelets: Secondary | ICD-10-CM | POA: Diagnosis not present

## 2022-12-10 ENCOUNTER — Ambulatory Visit (INDEPENDENT_AMBULATORY_CARE_PROVIDER_SITE_OTHER): Payer: Medicare Other | Admitting: Family

## 2022-12-10 ENCOUNTER — Encounter: Payer: Self-pay | Admitting: Family

## 2022-12-10 VITALS — BP 134/82 | HR 62 | Temp 97.1°F | Ht 65.0 in

## 2022-12-10 DIAGNOSIS — R32 Unspecified urinary incontinence: Secondary | ICD-10-CM | POA: Diagnosis not present

## 2022-12-10 DIAGNOSIS — R600 Localized edema: Secondary | ICD-10-CM

## 2022-12-10 DIAGNOSIS — N898 Other specified noninflammatory disorders of vagina: Secondary | ICD-10-CM

## 2022-12-10 MED ORDER — POTASSIUM CHLORIDE CRYS ER 20 MEQ PO TBCR: 20.0000 meq | EXTENDED_RELEASE_TABLET | Freq: Every day | ORAL | 3 refills | Status: DC

## 2022-12-10 MED ORDER — FUROSEMIDE 20 MG PO TABS: 20.0000 mg | ORAL_TABLET | Freq: Every day | ORAL | 3 refills | Status: DC | PRN

## 2022-12-10 NOTE — Progress Notes (Signed)
Provider: Marlowe Sax FNP-C  Rosezella Kronick, Nelda Bucks, NP  Patient Care Team: Quetzalli Clos, Nelda Bucks, NP as PCP - General (Family Medicine) Sueanne Margarita, MD as PCP - Cardiology (Cardiology) Trula Slade, DPM as Consulting Physician (Podiatry) Bond, Tracie Harrier, MD as Referring Physician (Ophthalmology)  Extended Emergency Contact Information Primary Emergency Contact: Dealmeida,Carl          [place] [address] [place] Phone: [phone] Mobile Phone: [phone] Relation: Son Secondary Emergency Contact: Stormy Fabian [place] Phone: [phone] Mobile Phone: [phone] Relation: Daughter  Code Status:  Full Code  Goals of care: Advanced Directive information    12/10/2022    1:33 PM  Advanced Directives  Does Patient Have a Medical Advance Directive? No  Would patient like information on creating a medical advance directive? No - Patient declined     Chief Complaint  Patient presents with   Acute Visit    Complains of Vaginal Discharge (white in color, no odor) for 2 weeks. Cough. Swelling on and off in feet and ankles.     HPI:  Pt is a 89 y.o. adult seen today for an acute visit for evaluation of Vaginal Discharge (white in color, no odor) for 2 weeks.  Discharge was noted by caregivers.She denies any fever,chills,abdominal pain,vaginal itching or bleeding.Also denies any strong odor. Has occasional cough.Coughs up phlegm.Tends to cough when drinking fluids or during meals.  Reports swelling on and off in feet and ankles.unable to weigh since she is on wheelchair. She denies any fatigue,body aches,runny nose,chest tightness,chest pain,palpitation or shortness of breath.  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   Edema extremities    GERD (gastroesophageal reflux disease)    Glaucoma    Macular degeneration    Per patient     Osteoarthritis    PVD (peripheral vascular disease) ([place])    Seasonal allergies    Stroke [facility])    Past Surgical History:  Procedure Laterality Date   ABDOMINAL HYSTERECTOMY  1981   TONSILLECTOMY  1953    Allergies  Allergen Reactions   Aspirin Other (See Comments)    sweats   Codeine Other (See Comments)    Unknown reaction   Iodine Other (See Comments)    Pt is not aware of this allergy, does not recall much info.   Prednisone Other (See Comments)    sweats   Lyrica [Pregabalin]     Wobbly, "more than sleepy"   Benzonatate Itching and Rash   Sulfa Antibiotics Other (See Comments)    Doesn't remember Other reaction(s): Unknown    Outpatient Encounter Medications as of 12/10/2022  Medication Sig   acetaminophen (TYLENOL) 500 MG tablet Take 500 mg by mouth every 6 (six) hours as needed for mild pain or moderate pain.    alum hydroxide-mag trisilicate (GAVISCON) [id number] MG CHEW chewable tablet Chew 2 tablets by mouth 2 (two) times daily.   amLODipine (NORVASC) 5 MG tablet TAKE 1 TABLET (5 MG TOTAL) BY MOUTH DAILY. Marland Kitchen   atorvastatin (LIPITOR) 10 MG tablet TAKE 1 TABLET BY MOUTH EVERY DAY   atropine 1 % ophthalmic solution Place 1 drop into the right eye daily.    Brinzolamide-Brimonidine 1-0.2 % SUSP Place 1 drop into both eyes 3 (three) times daily.   budesonide-formoterol (SYMBICORT) 80-4.5 MCG/ACT inhaler Inhale 2 puffs into the lungs 2 (two) times daily.  Calcium Citrate-Vitamin D (CALCIUM CITRATE + D3) 200-6.25 MG-MCG TABS Take 1 tablet by mouth daily.   clopidogrel (PLAVIX) 75 MG tablet TAKE 1 TABLET BY MOUTH EVERY DAY   Coenzyme Q10 (COQ10) 50 MG CAPS Take 50 mg by mouth daily.   docusate sodium (COLACE) 100 MG capsule TAKE 1 CAPSULE BY MOUTH TWICE A DAY   Emollient (CETAPHIL) cream Apply topically 2 (two) times daily. To legs   ferrous sulfate 325 (65 FE) MG EC tablet Take 325 mg by mouth daily.   hydrocortisone (ANUSOL-HC) 2.5 % rectal cream Place 1 application rectally  daily as needed for hemorrhoids or anal itching.   loratadine (CLARITIN) 10 MG tablet Take 10 mg by mouth daily.   metoprolol tartrate (LOPRESSOR) 25 MG tablet Take 0.5 tablets (12.5 mg total) by mouth 2 (two) times daily.   Multiple Vitamins-Minerals (PRESERVISION AREDS 2) CAPS Take 1 capsule by mouth 2 (two) times daily.   Multiple Vitamins-Minerals (SENIOR MULTIVITAMIN PLUS) TABS Take 1 tablet by mouth daily.    NONFORMULARY OR COMPOUNDED ITEM [facility]:  Peripheral Neuropathy Cream - Bupivacaine 1%, Doxepin 3%, Gabapentin 6%, Pentoxifylline 3%, Topiramate 1%, apply 1-2 grams to affected areas 3-4 times daily prn.   risperiDONE (RISPERDAL) 0.5 MG tablet TAKE 1 TABLET BY MOUTH TWICE A DAY   ROCKLATAN 0.02-0.005 % SOLN Place 1 drop into the left eye at bedtime.   vitamin C (ASCORBIC ACID) 500 MG tablet Take 500 mg by mouth daily.   No facility-administered encounter medications on file as of 12/10/2022.    Review of Systems  Constitutional:  Negative for appetite change, chills, fatigue, fever and unexpected weight change.  HENT:  Negative for congestion, dental problem, ear discharge, ear pain, facial swelling, hearing loss, nosebleeds, postnasal drip, rhinorrhea, sinus pressure, sinus pain, sneezing, sore throat, tinnitus and trouble swallowing.   Eyes:  Positive for visual disturbance. Negative for pain, discharge, redness and itching.       Legally blind   Respiratory:  Positive for cough. Negative for chest tightness, shortness of breath and wheezing.        Occasional cough with phlegm  Cardiovascular:  Positive for leg swelling. Negative for chest pain and palpitations.  Gastrointestinal:  Negative for abdominal distention, abdominal pain, blood in stool, constipation, diarrhea, nausea and vomiting.  Endocrine: Negative for cold intolerance, heat intolerance, polydipsia, polyphagia and polyuria.  Genitourinary:  Positive for vaginal discharge. Negative for difficulty urinating,  dysuria, flank pain, frequency and urgency.       Whitish vaginal discharge noted by caregivers for the past 2 weeks. Incontinent for urine wears adult pull-ups  Musculoskeletal:  Positive for gait problem. Negative for arthralgias, back pain, joint swelling, myalgias, neck pain and neck stiffness.  Skin:  Negative for color change, pallor, rash and wound.  Neurological:  Negative for dizziness, weakness, light-headedness and headaches.  Psychiatric/Behavioral:  Negative for agitation, behavioral problems, confusion, hallucinations and sleep disturbance. The patient is not nervous/anxious.     Immunization History  Administered Date(s) Administered   Fluad Quad(high Dose 65+) 06/04/2019, 07/21/2020   Influenza, High Dose Seasonal PF 10/19/2013, 08/02/2014, 08/11/2015, 11/22/2016, 07/16/2017, 11/21/2017, 07/20/2018, 11/19/2018, 11/18/2019, 11/17/2020, 11/28/2021, 07/23/2022   Influenza,inj,Quad PF,6+ Mos 06/11/2013, 06/30/2014, 07/17/2015, 06/27/2016   Influenza-Unspecified 07/07/2012   PFIZER(Purple Top)SARS-COV-2 Vaccination 11/20/2019, 12/13/2019, 09/20/2020, 11/17/2020, 07/18/2021   Pfizer Covid-19 Vaccine Bivalent Booster 58yr & up 07/18/2021   Pneumococcal Conjugate-13 11/07/2014   Pneumococcal Polysaccharide-23 10/08/2007, 08/11/2015, 11/22/2016, 11/21/2017, 11/19/2018, 11/18/2019, 11/17/2020, 11/28/2021   Unspecified SARS-COV-2 Vaccination  07/23/2022   Zoster Recombinat (Shingrix) 07/31/2017, 12/09/2017   Pertinent  Health Maintenance Due  Topic Date Due   INFLUENZA VACCINE  Completed   DEXA SCAN  Completed      09/16/2022    8:00 AM 09/16/2022    8:00 PM 09/17/2022   11:00 AM 11/27/2022   10:20 AM 12/10/2022    1:32 PM  Fall Risk  Falls in the past year?    0 0  Was there an injury with Fall?    0 0  Fall Risk Category Calculator    0 0  (RETIRED) Patient Fall Risk Level High fall risk High fall risk High fall risk    Patient at Risk for Falls Due to    No Fall Risks    Fall risk Follow up    Falls evaluation completed    Functional Status Survey:    Vitals:   12/10/22 1329  BP: 134/82  Pulse: 62  Temp: (!) 97.1 F (36.2 C)  SpO2: 98%  Height: '5\' 5"'$  (1.651 m)   Body mass index is 23.3 kg/m. Physical Exam Vitals reviewed.  Constitutional:      General: He is not in acute distress.    Appearance: Normal appearance. He is normal weight. He is not ill-appearing or diaphoretic.  HENT:     Head: Normocephalic.     Mouth/Throat:     Mouth: Mucous membranes are moist.     Pharynx: Oropharynx is clear. No oropharyngeal exudate or posterior oropharyngeal erythema.  Eyes:     Comments: Legally blind had eye shut throughout the visit.  Cardiovascular:     Rate and Rhythm: Normal rate and regular rhythm.     Pulses: Normal pulses.     Heart sounds: Normal heart sounds. No murmur heard.    No friction rub. No gallop.  Pulmonary:     Effort: Pulmonary effort is normal. No respiratory distress.     Breath sounds: Normal breath sounds. No wheezing, rhonchi or rales.  Chest:     Chest wall: No tenderness.  Abdominal:     General: Bowel sounds are normal. There is no distension.     Palpations: Abdomen is soft. There is no mass.     Tenderness: There is no abdominal tenderness. There is no right CVA tenderness, left CVA tenderness, guarding or rebound.  Genitourinary:    Comments: Unable to perform pelvic exam patient transfers with a hoyer lift.No odor or abdominal pain/tenderness noted on exam.  Musculoskeletal:        General: No swelling or tenderness. Normal range of motion.     Right lower leg: Edema present.     Left lower leg: Edema present.     Comments: Wheelchair dependent   Skin:    General: Skin is warm and dry.     Coloration: Skin is not pale.     Findings: No erythema or rash.  Neurological:     Mental Status: He is alert and oriented to person, place, and time.     Motor: No weakness.     Gait: Gait abnormal.  Psychiatric:         Mood and Affect: Mood normal.        Speech: Speech normal.        Behavior: Behavior normal.     Labs reviewed: Recent Labs    03/18/22 1624 09/11/22 2204 09/11/22 2208 11/27/22 1143  NA 142 138 136 140  K 4.2 4.3 4.5 4.0  CL 107  106 105 108  CO2 25 24  --  25  GLUCOSE 98 133* 131* 105*  BUN '10 18 21 20  '$ CREATININE 0.74 1.04* 1.10* 0.74  CALCIUM 9.6 8.9  --  8.4*   Recent Labs    03/12/22 0230 09/11/22 2204 11/27/22 1143  AST '23 26 17  '$ ALT '19 23 14  '$ ALKPHOS 73 67  --   BILITOT 1.0 0.8 0.5  PROT 6.5 6.3* 5.8*  ALBUMIN 3.5 3.3*  --    Recent Labs    03/18/22 1624 09/11/22 2204 09/11/22 2208 11/27/22 1143  WBC 6.2 6.4  --  5.9  NEUTROABS 3,807 3.5  --  3,086  HGB 12.0 11.6* 12.2 9.8*  HCT 36.4 33.6* 36.0 29.3*  MCV 95.5 91.6  --  92.4  PLT 248 187  --  270   Lab Results  Component Value Date   TSH 2.25 11/27/2022   Lab Results  Component Value Date   HGBA1C 5.4 09/12/2022   Lab Results  Component Value Date   CHOL 110 11/27/2022   HDL 50 11/27/2022   LDLCALC 47 11/27/2022   LDLDIRECT 28 09/12/2022   TRIG 45 11/27/2022   CHOLHDL 2.2 11/27/2022    Significant Diagnostic Results in last 30 days:  No results found.  Assessment/Plan  1. Vaginal discharge Afebrile.Whitish vaginal discharge reported by care giver. Unable to perform pelvic exam patient transfers with a hoyer lift.No odor or abdominal pain/tenderness noted on exam.  - recommend monitoring for now and notify provider if symptoms worsen or develop any abdominal pain or odor.will refer to Gyn.  - will obtain urine specimen to r/o UTI and yeast infection.  2. Edema of extremities Bilateral lower extremities 2+ edema  - Advised to wear knee high compression stockings on in the morning and off at bedtime. - start on furosemide 20 mg x 3 days then change daily as need for worsening edema ,cough or shortness of breath. - advised to take along with potassium supplement  - Compression  stockings - furosemide (LASIX) 20 MG tablet; Take 1 tablet (20 mg total) by mouth daily as needed for edema.  Dispense: 30 tablet; Refill: 3 - potassium chloride SA (KLOR-CON M) 20 MEQ tablet; Take 1 tablet (20 mEq total) by mouth daily.  Dispense: 30 tablet; Refill: 3  3. Urinary incontinence, unspecified type Urine supplies and a hat to collect urine specimen at home then return to [facility] lab.  - Urine Culture; Future  Family/ staff Communication: Reviewed plan of care with patient and son verbalized understanding.  Labs/tests ordered:  - Urine Culture; Future  Next Appointment: Return if symptoms worsen or fail to improve.   Sandrea Hughs, NP

## 2022-12-10 NOTE — Patient Instructions (Signed)
Take Furosemide 20 mg tablet one by mouth daily as needed .Take one tablet daily for the next three days then change to as needed. - Take Potassium chloride 20 meq daily along with Furosemide as needed.

## 2022-12-11 ENCOUNTER — Other Ambulatory Visit: Payer: Self-pay

## 2022-12-11 DIAGNOSIS — R32 Unspecified urinary incontinence: Secondary | ICD-10-CM | POA: Diagnosis not present

## 2022-12-12 ENCOUNTER — Other Ambulatory Visit: Payer: Self-pay | Admitting: Cardiology

## 2022-12-12 ENCOUNTER — Other Ambulatory Visit: Payer: Self-pay | Admitting: Family

## 2022-12-12 DIAGNOSIS — R600 Localized edema: Secondary | ICD-10-CM

## 2022-12-14 LAB — URINE CULTURE
MICRO NUMBER:: 14662232
SPECIMEN QUALITY:: ADEQUATE

## 2022-12-16 ENCOUNTER — Telehealth: Payer: Self-pay

## 2022-12-16 MED ORDER — CIPROFLOXACIN HCL 500 MG PO TABS: 500.0000 mg | ORAL_TABLET | Freq: Two times a day (BID) | ORAL | 0 refills | Status: AC

## 2022-12-16 MED ORDER — SACCHAROMYCES BOULARDII 250 MG PO CAPS: 250.0000 mg | ORAL_CAPSULE | Freq: Two times a day (BID) | ORAL | 0 refills | Status: AC

## 2022-12-16 NOTE — Telephone Encounter (Signed)
Medications sent to pharmacy

## 2022-12-17 ENCOUNTER — Ambulatory Visit: Payer: Self-pay

## 2022-12-17 NOTE — Patient Outreach (Addendum)
  Care Coordination   Follow Up Visit Note   12/17/2022 Name: [name] [name] MRN: [id number] DOB: 1924-07-31  [name] [name] is a 89 y.o. year old adult who sees Ngetich, Dinah C, NP for primary care. I spoke with son Sherica Paternostro by phone today.  What matters to the patients health and wellness today?  Patient's son will ensure patient will complete her full round of antibiotics for UTI. He will continue to monitor patient's BP at home and report abnormal findings.     Goals Addressed             This Visit's Progress    To monitor BP at home       Care Coordination Interventions: Completed successful outbound call to son [name]  Evaluation of current treatment plan related to hypertension self management and patient's adherence to plan as established by provider Determined son [name] received the BP cuff mailed to him from [facility]  Confirmed he also received the mailed educational materials with instructions on how to accurately measure BP  Advised patient, providing education and rationale, to monitor blood pressure daily and record, calling PCP for findings outside established parameters         To treat UTI without complications       Care Coordination Interventions: Evaluation of current treatment plan related to UTI and patient's adherence to plan as established by provider Determined patient was experiencing vaginal discharge, son took patient into PCP office for evaluation  Review of patient status, including review of consultant's reports, relevant laboratory and other test results, and medications completed Educated son [name] on the importance of patient completing full course of antibiotic and staying well hydrated with water  Instructed son [name] to notify patient's PCP of new symptoms or concerns        Interventions Today    Flowsheet Row Most Recent Value  Chronic Disease   Chronic disease during today's visit Other, Hypertension (HTN)  [UTI]  General  Interventions   General Interventions Discussed/Reviewed General Interventions Discussed, General Interventions Reviewed, Labs, Doctor Visits  Doctor Visits Discussed/Reviewed PCP  Nutrition Interventions   Nutrition Discussed/Reviewed Fluid intake  Pharmacy Interventions   Pharmacy Dicussed/Reviewed Pharmacy Topics Discussed, Pharmacy Topics Reviewed, Medications and their functions          SDOH assessments and interventions completed:  No     Care Coordination Interventions:  Yes, provided   Follow up plan: Follow up call scheduled for 02/28/23 @10 :30 AM     Encounter Outcome:  Pt. Visit Completed

## 2022-12-17 NOTE — Patient Instructions (Signed)
Visit Information  Thank you for taking time to visit with me today. Please don't hesitate to contact me if I can be of assistance to you.   Following are the goals we discussed today:   Goals Addressed             This Visit's Progress    To monitor BP at home       Care Coordination Interventions: Completed successful outbound call to son [name]  Evaluation of current treatment plan related to hypertension self management and patient's adherence to plan as established by provider Determined son [name] received the BP cuff mailed to him from [facility]  Confirmed he also received the mailed educational materials with instructions on how to accurately measure BP  Advised patient, providing education and rationale, to monitor blood pressure daily and record, calling PCP for findings outside established parameters         To treat UTI without complications       Care Coordination Interventions: Evaluation of current treatment plan related to UTI and patient's adherence to plan as established by provider Determined patient was experiencing vaginal discharge, son took patient into PCP office for evaluation  Review of patient status, including review of consultant's reports, relevant laboratory and other test results, and medications completed Educated son [name] on the importance of patient completing full course of antibiotic and staying well hydrated with water  Instructed son [name] to notify patient's PCP of new symptoms or concerns            Our next appointment is by telephone on 02/28/23 at 10:30 AM   Please call the care guide team at [phone] if you need to cancel or reschedule your appointment.   If you are experiencing a Mental Health or [place] or need someone to talk to, please call 1-800-273-TALK (toll free, 24 hour hotline) go to [facility] [address] [place] [phone])  Patient verbalizes  understanding of instructions and care plan provided today and agrees to view in [place]. Active MyChart status and patient understanding of how to access instructions and care plan via MyChart confirmed with patient.     Barb Merino, RN, BSN, CCM Care Management Coordinator [facility] Management  Direct Phone: [phone]

## 2022-12-20 DIAGNOSIS — J4530 Mild persistent asthma, uncomplicated: Secondary | ICD-10-CM | POA: Diagnosis not present

## 2022-12-20 DIAGNOSIS — R531 Weakness: Secondary | ICD-10-CM | POA: Diagnosis not present

## 2022-12-20 DIAGNOSIS — I119 Hypertensive heart disease without heart failure: Secondary | ICD-10-CM | POA: Diagnosis not present

## 2022-12-20 DIAGNOSIS — I69398 Other sequelae of cerebral infarction: Secondary | ICD-10-CM | POA: Diagnosis not present

## 2022-12-20 DIAGNOSIS — I4891 Unspecified atrial fibrillation: Secondary | ICD-10-CM | POA: Diagnosis not present

## 2022-12-20 DIAGNOSIS — I4892 Unspecified atrial flutter: Secondary | ICD-10-CM | POA: Diagnosis not present

## 2023-01-08 ENCOUNTER — Encounter: Payer: Self-pay | Admitting: Podiatry

## 2023-01-08 ENCOUNTER — Ambulatory Visit (INDEPENDENT_AMBULATORY_CARE_PROVIDER_SITE_OTHER): Payer: Medicare Other | Admitting: Podiatry

## 2023-01-08 DIAGNOSIS — M79675 Pain in left toe(s): Secondary | ICD-10-CM

## 2023-01-08 DIAGNOSIS — B351 Tinea unguium: Secondary | ICD-10-CM | POA: Diagnosis not present

## 2023-01-08 DIAGNOSIS — G5793 Unspecified mononeuropathy of bilateral lower limbs: Secondary | ICD-10-CM

## 2023-01-08 DIAGNOSIS — M79674 Pain in right toe(s): Secondary | ICD-10-CM | POA: Diagnosis not present

## 2023-01-09 NOTE — Progress Notes (Signed)
  Subjective:  [name], adult    DOB: 09-08-1924,  MRN: [id number]  89 y.o. adult presents for  Chief Complaint  Patient presents with   Nail Problem    RFC PCP-Ngetich PCP VST-3 months ago   She is seen for at risk foot care with history of diabetic neuropathy and painful thick toenails that are difficult to trim. Pain interferes with ambulation. Aggravating factors include wearing enclosed shoe gear. Pain is relieved with periodic professional debridement.  Patient is blind.  New problem(s): None   PCP is Ngetich, Dinah C, NP.  Allergies  Allergen Reactions   Aspirin Other (See Comments)    sweats   Codeine Other (See Comments)    Unknown reaction   Iodine Other (See Comments)    Pt is not aware of this allergy, does not recall much info.   Prednisone Other (See Comments)    sweats   Lyrica [Pregabalin]     Wobbly, "more than sleepy"   Benzonatate Itching and Rash   Sulfa Antibiotics Other (See Comments)    Doesn't remember Other reaction(s): Unknown    Review of Systems: Negative except as noted in the HPI.   Objective:  [name] [name] is a pleasant 89 y.o. adult WD, WN in NAD.Marland Kitchen AAO x 3.  Vascular Examination: Vascular status intact b/l with palpable pedal pulses. CFT immediate b/l. Pedal hair present.No edema. No pain with calf compression b/l. Skin temperature gradient WNL b/l. No varicosities noted.  Neurological Examination: Sensation grossly intact b/l with 10 gram monofilament. Vibratory sensation intact b/l.  Dermatological Examination: Pedal skin with normal turgor, texture and tone b/l. No open wounds nor interdigital macerations noted. Toenails 1-5 b/l thick, discolored, elongated with subungual debris and pain on dorsal palpation. No hyperkeratotic lesions noted b/l.   Musculoskeletal Examination: Muscle strength 5/5 to b/l LE.  No pain, crepitus noted b/l. No gross pedal deformities. She is sitting up in a wheelchair on today's  visit.   Radiographs: None Last A1c:      Latest Ref Rng & Units 09/12/2022    3:35 AM  Hemoglobin A1C  Hemoglobin-A1c 4.8 - 5.6 % 5.4      Assessment:   1. Pain due to onychomycosis of toenails of both feet   2. Neuropathy of both feet    Plan:  -Patient was evaluated and treated. All patient's and/or POA's questions/concerns answered on today's visit. -Patient to continue soft, supportive shoe gear daily. -Toenails 1-5 b/l were debrided in length and girth with sterile nail nippers and dremel without iatrogenic bleeding.  -Patient/POA to call should there be question/concern in the interim.  Return in about 3 months (around 04/09/2023).  Marzetta Board, DPM

## 2023-01-10 ENCOUNTER — Telehealth: Payer: Self-pay

## 2023-01-10 NOTE — Telephone Encounter (Signed)
Patient's son is aware to use suppositories. He states that patient takes a stool softener daily.

## 2023-01-10 NOTE — Telephone Encounter (Signed)
May use Preparation H suppositories.Add miralax 17 gm Pkt mix in a glass of water and drink daily to soften the stool.

## 2023-01-10 NOTE — Telephone Encounter (Signed)
Patient's son Baldo Ash called stating that patient is having problems with hemorrhoids. He says that they are using the tucks and the cream and it doesn't seem to be helping. Please advise.  Message sent to Richarda Blade, NP

## 2023-01-21 ENCOUNTER — Telehealth: Payer: Self-pay

## 2023-01-21 NOTE — Telephone Encounter (Signed)
Patient's son, Baldo Ash called stating that patient has been having cough for a while,but real bad cough past few days. It is a dry cough, no fever.Please advise.  Message sent to Richarda Blade, NP

## 2023-01-21 NOTE — Telephone Encounter (Signed)
Please schedule in office appointment for evaluation

## 2023-01-22 ENCOUNTER — Encounter: Payer: Self-pay | Admitting: Adult Health

## 2023-01-22 ENCOUNTER — Ambulatory Visit (INDEPENDENT_AMBULATORY_CARE_PROVIDER_SITE_OTHER): Payer: Medicare Other | Admitting: Adult Health

## 2023-01-22 VITALS — BP 127/80 | HR 100 | Temp 97.7°F | Resp 18 | Ht 65.0 in

## 2023-01-22 DIAGNOSIS — H548 Legal blindness, as defined in USA: Secondary | ICD-10-CM | POA: Diagnosis not present

## 2023-01-22 DIAGNOSIS — H01002 Unspecified blepharitis right lower eyelid: Secondary | ICD-10-CM

## 2023-01-22 DIAGNOSIS — H01005 Unspecified blepharitis left lower eyelid: Secondary | ICD-10-CM

## 2023-01-22 DIAGNOSIS — J209 Acute bronchitis, unspecified: Secondary | ICD-10-CM

## 2023-01-22 DIAGNOSIS — R058 Other specified cough: Secondary | ICD-10-CM | POA: Diagnosis not present

## 2023-01-22 LAB — POC COVID19 BINAXNOW: SARS Coronavirus 2 Ag: NEGATIVE

## 2023-01-22 MED ORDER — DM-GUAIFENESIN ER 30-600 MG PO TB12: 1.0000 | ORAL_TABLET | Freq: Two times a day (BID) | ORAL | 0 refills | Status: AC

## 2023-01-22 MED ORDER — AZITHROMYCIN 1 % OP SOLN: 1.0000 [drp] | Freq: Every day | OPHTHALMIC | 0 refills | Status: DC

## 2023-01-22 MED ORDER — AZITHROMYCIN 250 MG PO TABS: ORAL_TABLET | ORAL | 0 refills | Status: AC

## 2023-01-22 NOTE — Progress Notes (Signed)
-   hemoglobin 11.9, improved from 9.8, continue iron supplementation

## 2023-01-22 NOTE — Progress Notes (Signed)
[facility] clinic  Provider: Kenard Gower DNP  Code Status:  DNR  Goals of Care:     12/10/2022    1:33 PM  Advanced Directives  Does Patient Have a Medical Advance Directive? No  Would patient like information on creating a medical advance directive? No - Patient declined     Chief Complaint  Patient presents with   Acute Visit    Ongoing Cough    HPI: Patient is a 89 y.o. adult seen today for an acute visit for dry cough X 4 days. No fever nor chills. She was accompanied by her son. Son stated that patient sometimes complains of shortness of breath. She uses wheelchair and hoyer lift at home. Son has been giving her Mucinex daily. Noted to have bilateral eyelids with erythema and greenish crusting. She has bilateral blindness.  Son denies sick exposure. No wheezing noted. She takes Symbicort for asthma.  COVID-19 test was negative.  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   Edema extremities    GERD (gastroesophageal reflux disease)    Glaucoma    Macular degeneration    Per patient    Osteoarthritis    PVD (peripheral vascular disease)    Seasonal allergies    Stroke     Past Surgical History:  Procedure Laterality Date   ABDOMINAL HYSTERECTOMY  1981   TONSILLECTOMY  1953    Allergies  Allergen Reactions   Aspirin Other (See Comments)    sweats   Codeine Other (See Comments)    Unknown reaction   Iodine Other (See Comments)    Pt is not aware of this allergy, does not recall much info.   Prednisone Other (See Comments)    sweats   Lyrica [Pregabalin]     Wobbly, "more than sleepy"   Benzonatate Itching and Rash   Sulfa Antibiotics Other (See Comments)    Doesn't remember Other reaction(s): Unknown    Outpatient Encounter Medications as of 01/22/2023  Medication Sig   acetaminophen (TYLENOL) 500 MG tablet Take 500 mg by mouth every 6 (six) hours as needed  for mild pain or moderate pain.    alum hydroxide-mag trisilicate (GAVISCON) 80-20 MG CHEW chewable tablet Chew 2 tablets by mouth 2 (two) times daily.   amLODipine (NORVASC) 5 MG tablet TAKE 1 TABLET (5 MG TOTAL) BY MOUTH DAILY.   atorvastatin (LIPITOR) 10 MG tablet TAKE 1 TABLET BY MOUTH EVERY DAY   atropine 1 % ophthalmic solution Place 1 drop into the right eye daily.    azithromycin (AZASITE) 1 % ophthalmic solution Place 1 drop into both eyes daily for 7 days.   Brinzolamide-Brimonidine 1-0.2 % SUSP Place 1 drop into both eyes 3 (three) times daily.   budesonide-formoterol (SYMBICORT) 80-4.5 MCG/ACT inhaler Inhale 2 puffs into the lungs 2 (two) times daily.   Calcium Citrate-Vitamin D (CALCIUM CITRATE + D3) 200-6.25 MG-MCG TABS Take 1 tablet by mouth daily.   clopidogrel (PLAVIX) 75 MG tablet TAKE 1 TABLET BY MOUTH EVERY DAY   Coenzyme Q10 (COQ10) 50 MG CAPS Take 50 mg by mouth daily.   dextromethorphan-guaiFENesin (MUCINEX DM) 30-600 MG 12hr tablet Take 1 tablet by mouth 2 (two) times daily for 14 days.   docusate sodium (COLACE) 100 MG capsule TAKE 1 CAPSULE BY MOUTH TWICE A DAY   Emollient (CETAPHIL) cream Apply topically 2 (two) times daily. To legs  ferrous sulfate 325 (65 FE) MG EC tablet Take 325 mg by mouth daily.   furosemide (LASIX) 20 MG tablet Take 1 tablet (20 mg total) by mouth daily as needed for edema.   hydrocortisone (ANUSOL-HC) 2.5 % rectal cream Place 1 application rectally daily as needed for hemorrhoids or anal itching.   loratadine (CLARITIN) 10 MG tablet Take 10 mg by mouth daily.   metoprolol tartrate (LOPRESSOR) 25 MG tablet Take 0.5 tablets (12.5 mg total) by mouth 2 (two) times daily.   Multiple Vitamins-Minerals (PRESERVISION AREDS 2) CAPS Take 1 capsule by mouth 2 (two) times daily.   Multiple Vitamins-Minerals (SENIOR MULTIVITAMIN PLUS) TABS Take 1 tablet by mouth daily.    NONFORMULARY OR COMPOUNDED ITEM [facility]:  Peripheral Neuropathy Cream -  Bupivacaine 1%, Doxepin 3%, Gabapentin 6%, Pentoxifylline 3%, Topiramate 1%, apply 1-2 grams to affected areas 3-4 times daily prn.   potassium chloride SA (KLOR-CON M) 20 MEQ tablet Take 1 tablet (20 mEq total) by mouth daily.   risperiDONE (RISPERDAL) 0.5 MG tablet TAKE 1 TABLET BY MOUTH TWICE A DAY   ROCKLATAN 0.02-0.005 % SOLN Place 1 drop into the left eye at bedtime.   vitamin C (ASCORBIC ACID) 500 MG tablet Take 500 mg by mouth daily.   No facility-administered encounter medications on file as of 01/22/2023.    Review of Systems:  Review of Systems  Constitutional:  Negative for appetite change, chills, fatigue and fever.  HENT:  Negative for congestion, hearing loss, rhinorrhea and sore throat.   Eyes:  Positive for discharge and visual disturbance. Negative for pain.       Bilateral blindness  Respiratory:  Positive for cough and shortness of breath. Negative for wheezing.        Dry cough X 4 days  Cardiovascular:  Negative for chest pain, palpitations and leg swelling.  Gastrointestinal:  Negative for abdominal pain, constipation, diarrhea, nausea and vomiting.  Genitourinary:  Negative for dysuria.  Musculoskeletal:  Negative for arthralgias, back pain and myalgias.  Skin:  Negative for color change, rash and wound.  Neurological:  Negative for dizziness, weakness and headaches.  Psychiatric/Behavioral:  Negative for behavioral problems. The patient is not nervous/anxious.     Health Maintenance  Topic Date Due   DTaP/Tdap/Td (1 - Tdap) Never done   COVID-19 Vaccine (8 - 2023-24 season) 09/17/2022   INFLUENZA VACCINE  05/08/2023   Medicare Annual Wellness (AWV)  09/04/2023   Pneumonia Vaccine 72+ Years old  Completed   DEXA SCAN  Completed   Zoster Vaccines- Shingrix  Completed   HPV VACCINES  Aged Out    Physical Exam: Vitals:   01/22/23 1030  BP: 127/80  Pulse: 100  Resp: 18  Temp: 97.7 F (36.5 C)  SpO2: 94%  Height:  (1.651 m)   Body mass index is  23.3 kg/m. Physical Exam Constitutional:      General: He is not in acute distress.    Appearance: Normal appearance.  HENT:     Head: Normocephalic and atraumatic.     Mouth/Throat:     Mouth: Mucous membranes are moist.  Eyes:     Conjunctiva/sclera: Conjunctivae normal.  Cardiovascular:     Rate and Rhythm: Normal rate and regular rhythm.     Pulses: Normal pulses.     Heart sounds: Normal heart sounds.  Pulmonary:     Effort: Pulmonary effort is normal.     Breath sounds: Normal breath sounds.  Abdominal:  General: Bowel sounds are normal.     Palpations: Abdomen is soft.  Musculoskeletal:        General: Swelling present.     Cervical back: Normal range of motion.     Right lower leg: Edema present.     Left lower leg: Edema present.     Comments: Right-sided weakness BLE trace edema  Skin:    General: Skin is warm and dry.  Neurological:     General: No focal deficit present.     Mental Status: He is alert.     Comments: Alert to self and place, disoriented to time.  Psychiatric:        Mood and Affect: Mood normal.        Behavior: Behavior normal.        Thought Content: Thought content normal.        Judgment: Judgment normal.     Labs reviewed: Basic Metabolic Panel: Recent Labs    03/18/22 1624 09/11/22 2204 09/11/22 2208 09/11/22 2220 09/13/22 0413 11/27/22 1143  NA 142 138 136  --   --  140  K 4.2 4.3 4.5  --   --  4.0  CL 107 106 105  --   --  108  CO2 25 24  --   --   --  25  GLUCOSE 98 133* 131*  --   --  105*  BUN --   --  20  CREATININE 0.74 1.04* 1.10*  --   --  0.74  CALCIUM 9.6 8.9  --   --   --  8.4*  TSH  --   --   --  2.366 1.793 2.25   Liver Function Tests: Recent Labs    03/12/22 0230 09/11/22 2204 11/27/22 1143  AST ALT ALKPHOS 73 67  --   BILITOT 1.0 0.8 0.5  PROT 6.5 6.3* 5.8*  ALBUMIN 3.5 3.3*  --    No results for input(s): "LIPASE", "AMYLASE" in the last 8760 hours. Recent  Labs    09/12/22 2232  AMMONIA <10   CBC: Recent Labs    03/18/22 1624 09/11/22 2204 09/11/22 2208 11/27/22 1143  WBC 6.2 6.4  --  5.9  NEUTROABS 3,807 3.5  --  3,086  HGB 12.0 11.6* 12.2 9.8*  HCT 36.4 33.6* 36.0 29.3*  MCV 95.5 91.6  --  92.4  PLT 248 187  --  270   Lipid Panel: Recent Labs    09/12/22 0610 09/12/22 2232 11/27/22 1143  CHOL 88  --  110  HDL 48  --  50  LDLCALC NOT CALCULATED  --  47  TRIG <10  --  45  CHOLHDL 1.8  --  2.2  LDLDIRECT  --  28  --    Lab Results  Component Value Date   HGBA1C 5.4 09/12/2022    Procedures since last visit: No results found.  Assessment/Plan  1. Acute bronchitis, unspecified organism - POC COVID-19 was negative - CBC With Differential/Platelet - Basic Metabolic Panel with eGFR - dextromethorphan-guaiFENesin (MUCINEX DM) 30-600 MG 12hr tablet; Take 1 tablet by mouth 2 (two) times daily for 14 days.  Dispense: 28 tablet; Refill: 0 - azithromycin (ZITHROMAX) 250 MG tablet; Take 2 tablets on day 1, then 1 tablet daily on days 2 through 5  Dispense: 6 tablet; Refill: 0  2. Blepharitis of lower eyelids of both eyes, unspecified type -  instructed  son to apply warm compress to bilateral eyes QID -  will start on Z-pak  3. Legally blind -  fall precautions   Labs/tests ordered:  COVID-19 test, CBC and BMP  Next appt:  02/26/2023

## 2023-01-23 LAB — BASIC METABOLIC PANEL WITH GFR
BUN: 13 mg/dL (ref 7–25)
CO2: 26 mmol/L (ref 20–32)
Calcium: 9.2 mg/dL (ref 8.6–10.4)
Chloride: 104 mmol/L (ref 98–110)
Creat: 0.75 mg/dL (ref 0.60–0.95)
Glucose, Bld: 79 mg/dL (ref 65–99)
Potassium: 3.8 mmol/L (ref 3.5–5.3)
Sodium: 141 mmol/L (ref 135–146)
eGFR: 73 mL/min/{1.73_m2} (ref >=60)

## 2023-01-23 LAB — CBC WITH DIFFERENTIAL/PLATELET
Absolute Monocytes: 1024 cells/uL — ABNORMAL HIGH (ref 200–950)
Basophils Absolute: 32 cells/uL (ref 0–200)
Basophils Relative: 0.5 %
Eosinophils Absolute: 211 cells/uL (ref 15–500)
Eosinophils Relative: 3.3 %
HCT: 35.2 % (ref 35.0–45.0)
Hemoglobin: 11.9 g/dL (ref 11.7–15.5)
Lymphs Abs: 1766 cells/uL (ref 850–3900)
MCH: 30.4 pg (ref 27.0–33.0)
MCHC: 33.8 g/dL (ref 32.0–36.0)
MCV: 90 fL (ref 80.0–100.0)
MPV: 10.4 fL (ref 7.5–12.5)
Monocytes Relative: 16 %
Neutro Abs: 3366 cells/uL (ref 1500–7800)
Neutrophils Relative %: 52.6 %
Platelets: 248 10*3/uL (ref 140–400)
RBC: 3.91 10*6/uL (ref 3.80–5.10)
RDW: 13.7 % (ref 11.0–15.0)
Total Lymphocyte: 27.6 %
WBC: 6.4 10*3/uL (ref 3.8–10.8)

## 2023-01-23 NOTE — Progress Notes (Signed)
-    electrolytes and kidney function are all within normal

## 2023-01-27 ENCOUNTER — Telehealth: Payer: Self-pay

## 2023-01-27 NOTE — Telephone Encounter (Signed)
Patient's daughter, Dois Davenport called stating that she spoke with her lawyer and that they infomred her that the appropriate way to have her mother resign from handling her own finances is to have a letter. The letter needs to be from the provider stating that patient does not have the capacity to handle her finances.  Message routed to Richarda Blade, NP

## 2023-01-27 NOTE — Telephone Encounter (Signed)
Please draft and print out letter to be signed then POA to pickup or to be faxed if needed.

## 2023-01-30 ENCOUNTER — Encounter: Payer: Self-pay | Admitting: Family

## 2023-01-30 NOTE — Telephone Encounter (Signed)
Rewritten letter and please verify signed by Doctor or Nurse practitioner can sign? If the letter needs to be signed only by doctor,will need to schedule appointment with Dr.Miller for evaluation.

## 2023-01-30 NOTE — Telephone Encounter (Signed)
Spoke with patient after letter was drafted and sent. 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  Message routed to Richarda Blade, NP

## 2023-01-31 ENCOUNTER — Telehealth: Payer: Self-pay

## 2023-01-31 DIAGNOSIS — R051 Acute cough: Secondary | ICD-10-CM

## 2023-01-31 NOTE — Telephone Encounter (Signed)
Patient's son states that patient has finished antibiotic and doesn't seem to be any better. Patient is still coughing and coughing up stuff. Unknown if phlegm has any color to it.  Message sent to Richarda Blade, NP

## 2023-01-31 NOTE — Telephone Encounter (Signed)
Cough sometimes lingers for 2 weeks.If having any shortness of breath ,fever or chills recommend follow up.

## 2023-02-04 NOTE — Telephone Encounter (Signed)
Recommend chest X-ray  2 views to be done at [facility] imaging at [facility] then will call you with results.

## 2023-02-04 NOTE — Telephone Encounter (Signed)
Spoke with patient's on and he says that patient's cough seems to be getting worse. He denies patient having any shortness of breath, fever or chills. Just the "hard" cough and phlegm.

## 2023-02-05 NOTE — Telephone Encounter (Addendum)
Spoke with patient's son and he agreed. Please place order for chest xray

## 2023-02-05 NOTE — Telephone Encounter (Signed)
Chest X-ray ordered.Please get X-ray at [facility] imaging at [facility] then will call you with results.No appointment needed for X-ray just walk - in.

## 2023-02-06 ENCOUNTER — Ambulatory Visit
Admission: RE | Admit: 2023-02-06 | Discharge: 2023-02-06 | Disposition: A | Discharge status: Home / Self Care | Payer: Medicare Other | Source: Ambulatory Visit | Attending: Family | Admitting: Family

## 2023-02-06 DIAGNOSIS — R059 Cough, unspecified: Secondary | ICD-10-CM | POA: Diagnosis not present

## 2023-02-06 DIAGNOSIS — R051 Acute cough: Secondary | ICD-10-CM

## 2023-02-06 DIAGNOSIS — R0603 Acute respiratory distress: Secondary | ICD-10-CM | POA: Diagnosis not present

## 2023-02-10 ENCOUNTER — Telehealth: Payer: Self-pay

## 2023-02-10 NOTE — Telephone Encounter (Signed)
Patient's son Baldo Ash, called requesting results of chest xray. He states that patient isn't getting any better.  Message sent to Abbey Chatters, NP (covering provider)

## 2023-02-10 NOTE — Telephone Encounter (Signed)
The xray results have not come back yet

## 2023-02-11 NOTE — Telephone Encounter (Signed)
LATE ENTRY: Patient's son informed 02/10/23 that xray results have not come back yet.

## 2023-02-11 NOTE — Telephone Encounter (Signed)
I have not seen this person but maybe I could speak with a provider who has seen her and has knowledge of her condition

## 2023-02-12 ENCOUNTER — Encounter: Payer: Self-pay | Admitting: Family Medicine

## 2023-02-12 ENCOUNTER — Ambulatory Visit: Payer: Federal, State, Local not specified - PPO | Admitting: Family

## 2023-02-12 NOTE — Telephone Encounter (Signed)
Dr. Hyacinth Meeker read letter that was submitted and signed the letter. [name] [name] made aware that letter would be placed in the mail today.

## 2023-02-13 ENCOUNTER — Ambulatory Visit: Payer: Federal, State, Local not specified - PPO | Admitting: Adult Health

## 2023-02-13 DIAGNOSIS — J3089 Other allergic rhinitis: Secondary | ICD-10-CM | POA: Diagnosis not present

## 2023-02-13 DIAGNOSIS — J300 Vasomotor rhinitis: Secondary | ICD-10-CM | POA: Diagnosis not present

## 2023-02-13 DIAGNOSIS — J189 Pneumonia, unspecified organism: Secondary | ICD-10-CM | POA: Diagnosis not present

## 2023-02-13 DIAGNOSIS — J4540 Moderate persistent asthma, uncomplicated: Secondary | ICD-10-CM | POA: Diagnosis not present

## 2023-02-16 ENCOUNTER — Other Ambulatory Visit: Payer: Self-pay | Admitting: Family

## 2023-02-21 ENCOUNTER — Telehealth: Payer: Self-pay

## 2023-02-21 DIAGNOSIS — I10 Essential (primary) hypertension: Secondary | ICD-10-CM

## 2023-02-21 DIAGNOSIS — R600 Localized edema: Secondary | ICD-10-CM

## 2023-02-21 NOTE — Telephone Encounter (Signed)
Patients son called stating he would like for Dinah to place an order like before to have a nurse come out, once a week. I asked for the reason for request and Baldo Ash replied " Carilyn Goodpasture has helped [place] with this before, we need a nurse once a week."

## 2023-02-21 NOTE — Telephone Encounter (Signed)
Please verify whether HHN or HH Aide?

## 2023-02-24 NOTE — Telephone Encounter (Signed)
Please verify with POA whether HHN required or HH Aide.

## 2023-02-24 NOTE — Telephone Encounter (Signed)
[facility], per previous referral. Send additional responses to the clinical intake assistant for the day- Elveria Royals, CMA

## 2023-02-25 NOTE — Telephone Encounter (Signed)
Home health Nurse ordered as requested.

## 2023-02-25 NOTE — Telephone Encounter (Signed)
Spoke with Baldo Ash and he is requesting a Engineer, civil (consulting). They already have an aide.

## 2023-02-26 ENCOUNTER — Encounter: Payer: Self-pay | Admitting: Adult Health

## 2023-02-26 ENCOUNTER — Ambulatory Visit (INDEPENDENT_AMBULATORY_CARE_PROVIDER_SITE_OTHER): Payer: Medicare Other | Admitting: Adult Health

## 2023-02-26 VITALS — BP 110/60 | HR 59 | Temp 97.8°F | Resp 16 | Ht 65.0 in

## 2023-02-26 DIAGNOSIS — Z8673 Personal history of transient ischemic attack (TIA), and cerebral infarction without residual deficits: Secondary | ICD-10-CM

## 2023-02-26 DIAGNOSIS — E876 Hypokalemia: Secondary | ICD-10-CM | POA: Diagnosis not present

## 2023-02-26 DIAGNOSIS — I739 Peripheral vascular disease, unspecified: Secondary | ICD-10-CM | POA: Diagnosis not present

## 2023-02-26 DIAGNOSIS — I10 Essential (primary) hypertension: Secondary | ICD-10-CM

## 2023-02-26 DIAGNOSIS — F0390 Unspecified dementia without behavioral disturbance: Secondary | ICD-10-CM

## 2023-02-26 DIAGNOSIS — K5909 Other constipation: Secondary | ICD-10-CM

## 2023-02-26 DIAGNOSIS — E785 Hyperlipidemia, unspecified: Secondary | ICD-10-CM | POA: Diagnosis not present

## 2023-02-26 DIAGNOSIS — I700 Atherosclerosis of aorta: Secondary | ICD-10-CM | POA: Diagnosis not present

## 2023-02-26 DIAGNOSIS — K219 Gastro-esophageal reflux disease without esophagitis: Secondary | ICD-10-CM | POA: Diagnosis not present

## 2023-02-26 DIAGNOSIS — I708 Atherosclerosis of other arteries: Secondary | ICD-10-CM

## 2023-02-26 DIAGNOSIS — J3089 Other allergic rhinitis: Secondary | ICD-10-CM | POA: Diagnosis not present

## 2023-02-26 DIAGNOSIS — R600 Localized edema: Secondary | ICD-10-CM | POA: Diagnosis not present

## 2023-02-26 DIAGNOSIS — T502X5A Adverse effect of carbonic-anhydrase inhibitors, benzothiadiazides and other diuretics, initial encounter: Secondary | ICD-10-CM

## 2023-02-26 DIAGNOSIS — J4530 Mild persistent asthma, uncomplicated: Secondary | ICD-10-CM

## 2023-02-26 DIAGNOSIS — F22 Delusional disorders: Secondary | ICD-10-CM

## 2023-02-26 DIAGNOSIS — D649 Anemia, unspecified: Secondary | ICD-10-CM | POA: Insufficient documentation

## 2023-02-26 NOTE — Progress Notes (Signed)
Location:  [facility]   Place of Service:   clinic    CODE STATUS: dnr  Allergies  Allergen Reactions   Aspirin Other (See Comments)    sweats   Codeine Other (See Comments)    Unknown reaction   Iodine Other (See Comments)    Pt is not aware of this allergy, does not recall much info.   Prednisone Other (See Comments)    sweats   Lyrica [Pregabalin]     Wobbly, "more than sleepy"   Benzonatate Itching and Rash   Sulfa Antibiotics Other (See Comments)    Doesn't remember Other reaction(s): Unknown    Chief Complaint  Patient presents with   Medical Management of Chronic Issues    Patient is being seen for a 3 month follow up   Immunizations    Discuss the need for Tdap and Covid Vaccine [facility] verified    HPI:  She is here for a routine follow up. We have decided that she does not need a tetanus vaccine; she is nonambulatory. She does get out of bed via lift to wheelchair. She is blind. She is alert to herself. She has been treated for acute bronchitis since her last visit here. Her son reports that her cough is getting better. We discussed that her cough may last for a prolonged period of time. He did verbalize understanding.   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   Edema extremities    GERD (gastroesophageal reflux disease)    Glaucoma    Macular degeneration    Per patient    Osteoarthritis    PVD (peripheral vascular disease) (HCC)    Seasonal allergies    Stroke [facility])     Past Surgical History:  Procedure Laterality Date   ABDOMINAL HYSTERECTOMY  1981   TONSILLECTOMY  1953    Social History   Socioeconomic History   Marital status: Widowed    Spouse name: Not on file   Number of children: Not on file   Years of education: Not on file   Highest education level: Not on file  Occupational History   Not on file  Tobacco Use   Smoking status:  Never   Smokeless tobacco: Never  Vaping Use   Vaping Use: Never used  Substance and Sexual Activity   Alcohol use: No    Alcohol/week: 0.0 standard drinks of alcohol   Drug use: No   Sexual activity: Not Currently  Other Topics Concern   Not on file  Social History Narrative   Was cosmetologist, 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, does not have pets, has living will   Social Determinants of Health   Financial Resource Strain: Low Risk  (07/31/2018)   Overall Financial Resource Strain (CARDIA)    Difficulty of Paying Living Expenses: Not hard at all  Food Insecurity: No Food Insecurity (07/31/2018)   Hunger Vital Sign    Worried About Running Out of Food in the Last Year: Never true    Ran Out of Food in the Last Year: Never true  Transportation Needs: No Transportation Needs (07/31/2018)   PRAPARE - Administrator, Civil Service (Medical): No    Lack of Transportation (Non-Medical): No  Physical Activity: Insufficiently Active (07/31/2018)   Exercise Vital Sign    Days of Exercise per Week: 3 days    Minutes  of Exercise per Session: 30 min  Stress: No Stress Concern Present (07/31/2018)   [employer] of Occupational Health - Occupational Stress Questionnaire    Feeling of Stress : Not at all  Social Connections: Somewhat Isolated (07/31/2018)   Social Connection and Isolation Panel [NHANES]    Frequency of Communication with Friends and Family: More than three times a week    Frequency of Social Gatherings with Friends and Family: More than three times a week    Attends Religious Services: More than 4 times per year    Active Member of [employer] or Organizations: No    Attends Banker Meetings: Never    Marital Status: Widowed  Intimate Partner Violence: Not At Risk (07/31/2018)   Humiliation, Afraid, Rape, and Kick questionnaire    Fear of Current or Ex-Partner: No    Emotionally Abused: No    Physically Abused: No    Sexually Abused: No    Family History  Problem Relation Age of Onset   Stroke Father    Hypertension Father    Heart disease Mother    Hypertension Mother    Diabetes Mother       VITAL SIGNS BP 110/60   Pulse (!) 59   Temp 97.8 F (36.6 C)   Resp 16   Ht 5\' 5"  (1.651 m)   SpO2 98%   BMI 23.30 kg/m   Outpatient Encounter Medications as of 02/26/2023  Medication Sig   acetaminophen (TYLENOL) 500 MG tablet Take 500 mg by mouth every 6 (six) hours as needed for mild pain or moderate pain.    alum hydroxide-mag trisilicate (GAVISCON) 80-20 MG CHEW chewable tablet Chew 2 tablets by mouth 2 (two) times daily.   amLODipine (NORVASC) 5 MG tablet TAKE 1 TABLET (5 MG TOTAL) BY MOUTH DAILY.   atorvastatin (LIPITOR) 10 MG tablet TAKE 1 TABLET BY MOUTH EVERY DAY   atropine 1 % ophthalmic solution Place 1 drop into the right eye daily.    Brinzolamide-Brimonidine 1-0.2 % SUSP Place 1 drop into both eyes 3 (three) times daily.   budesonide-formoterol (SYMBICORT) 80-4.5 MCG/ACT inhaler Inhale 2 puffs into the lungs 2 (two) times daily.   Calcium Citrate-Vitamin D (CALCIUM CITRATE + D3) 200-6.25 MG-MCG TABS Take 1 tablet by mouth daily.   clopidogrel (PLAVIX) 75 MG tablet TAKE 1 TABLET BY MOUTH EVERY DAY   Coenzyme Q10 (COQ10) 50 MG CAPS Take 50 mg by mouth daily.   docusate sodium (COLACE) 100 MG capsule TAKE 1 CAPSULE BY MOUTH TWICE A DAY   Emollient (CETAPHIL) cream Apply topically 2 (two) times daily. To legs   ferrous sulfate 325 (65 FE) MG EC tablet Take 325 mg by mouth daily.   furosemide (LASIX) 20 MG tablet Take 1 tablet (20 mg total) by mouth daily as needed for edema.   hydrocortisone (ANUSOL-HC) 2.5 % rectal cream Place 1 application rectally daily as needed for hemorrhoids or anal itching.   loratadine (CLARITIN) 10 MG tablet Take 10 mg by mouth daily.   metoprolol tartrate (LOPRESSOR) 25 MG tablet Take 0.5 tablets (12.5 mg total) by mouth 2 (two) times daily.   Multiple Vitamins-Minerals  (PRESERVISION AREDS 2) CAPS Take 1 capsule by mouth 2 (two) times daily.   Multiple Vitamins-Minerals (SENIOR MULTIVITAMIN PLUS) TABS Take 1 tablet by mouth daily.    NONFORMULARY OR COMPOUNDED ITEM [facility]:  Peripheral Neuropathy Cream - Bupivacaine 1%, Doxepin 3%, Gabapentin 6%, Pentoxifylline 3%, Topiramate 1%, apply 1-2 grams to affected areas  3-4 times daily prn.   potassium chloride SA (KLOR-CON M) 20 MEQ tablet Take 1 tablet (20 mEq total) by mouth daily.   risperiDONE (RISPERDAL) 0.5 MG tablet TAKE 1 TABLET BY MOUTH TWICE A DAY   ROCKLATAN 0.02-0.005 % SOLN Place 1 drop into the left eye at bedtime.   vitamin C (ASCORBIC ACID) 500 MG tablet Take 500 mg by mouth daily.   No facility-administered encounter medications on file as of 02/26/2023.     SIGNIFICANT DIAGNOSTIC EXAMS  Review of Systems  Reason unable to perform ROS: unable to fully participate.   Physical Exam Constitutional:      General: She is not in acute distress.    Appearance: She is well-developed. She is not diaphoretic.  Eyes:     Comments: Blind both eyes   Neck:     Thyroid: No thyromegaly.  Cardiovascular:     Rate and Rhythm: Regular rhythm. Bradycardia present.     Pulses: Normal pulses.     Heart sounds: Normal heart sounds.  Pulmonary:     Effort: Pulmonary effort is normal. No respiratory distress.     Breath sounds: Normal breath sounds.  Abdominal:     General: Bowel sounds are normal. There is no distension.     Palpations: Abdomen is soft.     Tenderness: There is no abdominal tenderness.  Musculoskeletal:     Cervical back: Neck supple.     Right lower leg: No edema.     Left lower leg: No edema.     Comments: Trace bilateral lower extremity Leans head to left side   Lymphadenopathy:     Cervical: No cervical adenopathy.  Skin:    General: Skin is warm and dry.  Neurological:     Mental Status: She is alert. Mental status is at baseline.  Psychiatric:        Mood and  Affect: Mood normal.      ASSESSMENT/ PLAN:  TODAY  Aorto-iliac atherosclerosis: is on statin and plavix  2. PVD (Peripheral vascular disease) is on plavix daily; is nonambulatory   3. Mild intermittent asthma without complication; non-seasonal allergic rhinitis unspecified trigger: will continue symbicort 80/4.5 mcg 2 puffs twice daily; claritin 10 mg daily   4. Gastroesophageal reflux disease without esophagitis: will continue gaviscon twice daily   5. Major neurocognitive disorder: weight is 140 pounds and is stable  6. Edema of lower extremities/diuretic induced hypokalemia: will continue lasix 20 mg daily as needed and will continue k+ 20 meq daily   7. History of CVA: will continue plavix 75 mg daily   8. Hyperlipidemia unspecified hyperlipidemia type: will continue lipitor 10 mg daily  9. Essential hypertension: will continue norvasc 5 mg daily and lopressor 12.5 mg twice daily   10. Chronic constipation: will continue colace twice daily   11. Chronic anemia: will continue iron daily   12. Delusion: per family continues to have will continue risperdal 0.5 mg twice daily    Synthia Innocent NP [facility]   call [phone]

## 2023-02-28 ENCOUNTER — Ambulatory Visit: Payer: Self-pay

## 2023-02-28 NOTE — Patient Instructions (Signed)
Visit Information  Thank you for taking time to visit with me today. Please don't hesitate to contact me if I can be of assistance to you.   Following are the goals we discussed today:   Goals Addressed             This Visit's Progress    To continue to eat enough during meal times       Care Coordination Interventions: Evaluation of current treatment plan related to Nutritional support  and patient's adherence to plan as established by provider Completed call with son Baldo Ash Determined patient continues to have a good appetite but some meals she may not finish Encouraged Baldo Ash to continue to monitor for signs of decreased appetite and or weight loss and to keep patient's doctor well informed of new symptoms or concerns Discussed patient is drinking Ensure between meals, request placed for [facility] care management team to mail Ensure coupons to help cover the cost as son Baldo Ash admits this supplement can be costly        COMPLETED: To monitor BP at home       Care Coordination Interventions: Completed successful outbound call to son Baldo Ash  Evaluation of current treatment plan related to hypertension self management and patient's adherence to plan as established by provider Determined Baldo Ash continues to monitor his mother's BP at home daily, he records her readings and reports all BP have been WNL, he voices having no further concerns related to his mother's BP at this time Instructed patient to notify patient's doctor of abnormal BP reading's or concerns and he verbalizes understanding         COMPLETED: To treat UTI without complications       Care Coordination Interventions: Evaluation of current treatment plan related to UTI and patient's adherence to plan as established by provider Completed call with son Baldo Ash who reports his mother's past UTI resolved without complications, he voices having no other concerns today Instructed son Baldo Ash to notify patient's doctor of new symptoms or  concerns promptly        Our next appointment is by telephone on 04/30/23 at 10:30 AM  Please call the care guide team at [phone] if you need to cancel or reschedule your appointment.   If you are experiencing a Mental Health or Behavioral Health Crisis or need someone to talk to, please call 1-800-273-TALK (toll free, 24 hour hotline) go to [facility] [address] [place] [phone])  Patient verbalizes understanding of instructions and care plan provided today and agrees to view in MyChart. Active MyChart status and patient understanding of how to access instructions and care plan via MyChart confirmed with patient.     Delsa Sale, RN, BSN, CCM Care Management Coordinator [facility] Care Management Direct Phone: [phone]

## 2023-02-28 NOTE — Patient Outreach (Signed)
  Care Coordination   Follow Up Visit Note   02/28/2023 Name: [name] [name] MRN: [id number] DOB: 01-11-1924  [name] [name] is a 89 y.o. year old female who sees Ngetich, Donalee Citrin, NP for primary care. I spoke with son Haisley Fiscal by phone today.  What matters to the patients health and wellness today?  Patient would like to continue to stay healthy and nutritionally sound.     Goals Addressed             This Visit's Progress    To continue to eat enough during meal times       Care Coordination Interventions: Evaluation of current treatment plan related to Nutritional support  and patient's adherence to plan as established by provider Completed call with son Baldo Ash Determined patient continues to have a good appetite but some meals she may not finish Encouraged Baldo Ash to continue to monitor for signs of decreased appetite and or weight loss and to keep patient's doctor well informed of new symptoms or concerns Discussed patient is drinking Ensure between meals, request placed for [facility] care management team to mail Ensure coupons to help cover the cost as son Baldo Ash admits this supplement can be costly        COMPLETED: To monitor BP at home       Care Coordination Interventions: Completed successful outbound call to son Baldo Ash  Evaluation of current treatment plan related to hypertension self management and patient's adherence to plan as established by provider Determined Baldo Ash continues to monitor his mother's BP at home daily, he records her readings and reports all BP have been WNL, he voices having no further concerns related to his mother's BP at this time Instructed patient to notify patient's doctor of abnormal BP reading's or concerns and he verbalizes understanding         COMPLETED: To treat UTI without complications       Care Coordination Interventions: Evaluation of current treatment plan related to UTI and patient's adherence to plan as established by provider Completed  call with son Baldo Ash who reports his mother's past UTI resolved without complications, he voices having no other concerns today Instructed son Baldo Ash to notify patient's doctor of new symptoms or concerns promptly    Interventions Today    Flowsheet Row Most Recent Value  Chronic Disease   Chronic disease during today's visit Other, Hypertension (HTN)  [s/p UTI,  s/p bronchitis]  General Interventions   General Interventions Discussed/Reviewed General Interventions Discussed, General Interventions Reviewed, Doctor Visits  Doctor Visits Discussed/Reviewed PCP, Doctor Visits Reviewed, Doctor Visits Discussed, Specialist  Education Interventions   Education Provided Provided Education, Provided Printed Education  Provided Verbal Education On When to see the doctor, Nutrition  Nutrition Interventions   Nutrition Discussed/Reviewed Nutrition Discussed, Nutrition Reviewed, Supplemental nutrition          SDOH assessments and interventions completed:  No     Care Coordination Interventions:  Yes, provided   Follow up plan: Follow up call scheduled for 04/30/23 @10 :30 AM    Encounter Outcome:  Pt. Visit Completed

## 2023-03-04 ENCOUNTER — Telehealth: Payer: Federal, State, Local not specified - PPO

## 2023-03-04 NOTE — Telephone Encounter (Signed)
Patient son called and states that nobody has came to take care of his mother. Patient notified that [facility] will take over care according to documentation is chart. I offered to give patient son the number. He requested that I text the number to his phone from my cell phone. I verbalized that Im not allowed to text patients from my phone its inappropriate. He stated that he doesn't know how to write. So he said he will look up the phone number himself.

## 2023-03-05 DIAGNOSIS — F0394 Unspecified dementia, unspecified severity, with anxiety: Secondary | ICD-10-CM | POA: Diagnosis not present

## 2023-03-05 DIAGNOSIS — I483 Typical atrial flutter: Secondary | ICD-10-CM | POA: Diagnosis not present

## 2023-03-05 DIAGNOSIS — J45909 Unspecified asthma, uncomplicated: Secondary | ICD-10-CM | POA: Diagnosis not present

## 2023-03-05 DIAGNOSIS — J309 Allergic rhinitis, unspecified: Secondary | ICD-10-CM | POA: Diagnosis not present

## 2023-03-05 DIAGNOSIS — I10 Essential (primary) hypertension: Secondary | ICD-10-CM | POA: Diagnosis not present

## 2023-03-05 DIAGNOSIS — J4520 Mild intermittent asthma, uncomplicated: Secondary | ICD-10-CM | POA: Diagnosis not present

## 2023-03-05 DIAGNOSIS — I739 Peripheral vascular disease, unspecified: Secondary | ICD-10-CM | POA: Diagnosis not present

## 2023-03-05 DIAGNOSIS — Z7902 Long term (current) use of antithrombotics/antiplatelets: Secondary | ICD-10-CM | POA: Diagnosis not present

## 2023-03-10 ENCOUNTER — Other Ambulatory Visit: Payer: Self-pay | Admitting: Family

## 2023-03-10 DIAGNOSIS — R600 Localized edema: Secondary | ICD-10-CM

## 2023-03-14 ENCOUNTER — Telehealth: Payer: Self-pay | Admitting: *Deleted

## 2023-03-14 ENCOUNTER — Telehealth: Payer: Self-pay

## 2023-03-14 ENCOUNTER — Other Ambulatory Visit: Payer: Self-pay

## 2023-03-14 ENCOUNTER — Other Ambulatory Visit: Payer: Self-pay | Admitting: Cardiology

## 2023-03-14 DIAGNOSIS — I10 Essential (primary) hypertension: Secondary | ICD-10-CM

## 2023-03-14 MED ORDER — AMLODIPINE BESYLATE 5 MG PO TABS: 5.0000 mg | ORAL_TABLET | Freq: Every day | ORAL | 0 refills | Status: DC

## 2023-03-14 NOTE — Telephone Encounter (Signed)
Called patient to advise that we cannot fill amlodipine as we have not seen patient since 2022. Patient was asleep, spoke to son Baldo Ash [facility]). Advised him to pursue refills with primary, Rica Mast understanding.

## 2023-03-14 NOTE — Telephone Encounter (Signed)
Patient's son, [name] [name] called stating that Nurse with [facility] came out last week for evaluation, but there hasn't been a nurse to come out his week. He stated that he called [facility] and they told him that they are waiting to receive orders from our office.  Message sent to Concha Norway, NP

## 2023-03-14 NOTE — Telephone Encounter (Signed)
Home health referral orders for Nursing was sent 02/25/2023.please see referral orders.

## 2023-03-14 NOTE — Telephone Encounter (Signed)
Patient pharmacy is requesting a refill for Amlodipine Besylate 5mg  #90. Patient last appointment was 09/03/21. Please advise if she can have a thirty day (30)day supply, until she schedule an appointment. Thank you

## 2023-03-17 DIAGNOSIS — H401124 Primary open-angle glaucoma, left eye, indeterminate stage: Secondary | ICD-10-CM | POA: Diagnosis not present

## 2023-03-17 DIAGNOSIS — H401113 Primary open-angle glaucoma, right eye, severe stage: Secondary | ICD-10-CM | POA: Diagnosis not present

## 2023-04-02 DIAGNOSIS — J309 Allergic rhinitis, unspecified: Secondary | ICD-10-CM | POA: Diagnosis not present

## 2023-04-02 DIAGNOSIS — F0394 Unspecified dementia, unspecified severity, with anxiety: Secondary | ICD-10-CM | POA: Diagnosis not present

## 2023-04-02 DIAGNOSIS — I483 Typical atrial flutter: Secondary | ICD-10-CM | POA: Diagnosis not present

## 2023-04-02 DIAGNOSIS — I739 Peripheral vascular disease, unspecified: Secondary | ICD-10-CM | POA: Diagnosis not present

## 2023-04-02 DIAGNOSIS — I10 Essential (primary) hypertension: Secondary | ICD-10-CM | POA: Diagnosis not present

## 2023-04-02 DIAGNOSIS — J45909 Unspecified asthma, uncomplicated: Secondary | ICD-10-CM | POA: Diagnosis not present

## 2023-04-04 DIAGNOSIS — J45909 Unspecified asthma, uncomplicated: Secondary | ICD-10-CM | POA: Diagnosis not present

## 2023-04-04 DIAGNOSIS — F0394 Unspecified dementia, unspecified severity, with anxiety: Secondary | ICD-10-CM | POA: Diagnosis not present

## 2023-04-04 DIAGNOSIS — I10 Essential (primary) hypertension: Secondary | ICD-10-CM | POA: Diagnosis not present

## 2023-04-04 DIAGNOSIS — I739 Peripheral vascular disease, unspecified: Secondary | ICD-10-CM | POA: Diagnosis not present

## 2023-04-04 DIAGNOSIS — J4520 Mild intermittent asthma, uncomplicated: Secondary | ICD-10-CM | POA: Diagnosis not present

## 2023-04-04 DIAGNOSIS — I483 Typical atrial flutter: Secondary | ICD-10-CM | POA: Diagnosis not present

## 2023-04-04 DIAGNOSIS — Z7902 Long term (current) use of antithrombotics/antiplatelets: Secondary | ICD-10-CM | POA: Diagnosis not present

## 2023-04-04 DIAGNOSIS — J309 Allergic rhinitis, unspecified: Secondary | ICD-10-CM | POA: Diagnosis not present

## 2023-04-08 ENCOUNTER — Other Ambulatory Visit: Payer: Self-pay | Admitting: Family

## 2023-04-08 DIAGNOSIS — I10 Essential (primary) hypertension: Secondary | ICD-10-CM

## 2023-04-11 DIAGNOSIS — I483 Typical atrial flutter: Secondary | ICD-10-CM | POA: Diagnosis not present

## 2023-04-11 DIAGNOSIS — I10 Essential (primary) hypertension: Secondary | ICD-10-CM | POA: Diagnosis not present

## 2023-04-11 DIAGNOSIS — F0394 Unspecified dementia, unspecified severity, with anxiety: Secondary | ICD-10-CM | POA: Diagnosis not present

## 2023-04-11 DIAGNOSIS — J309 Allergic rhinitis, unspecified: Secondary | ICD-10-CM | POA: Diagnosis not present

## 2023-04-11 DIAGNOSIS — I739 Peripheral vascular disease, unspecified: Secondary | ICD-10-CM | POA: Diagnosis not present

## 2023-04-11 DIAGNOSIS — J45909 Unspecified asthma, uncomplicated: Secondary | ICD-10-CM | POA: Diagnosis not present

## 2023-04-14 ENCOUNTER — Encounter: Payer: Self-pay | Admitting: Family

## 2023-04-14 ENCOUNTER — Ambulatory Visit (INDEPENDENT_AMBULATORY_CARE_PROVIDER_SITE_OTHER): Payer: Medicare Other | Admitting: Family

## 2023-04-14 VITALS — BP 128/80 | HR 50 | Temp 97.9°F

## 2023-04-14 DIAGNOSIS — R600 Localized edema: Secondary | ICD-10-CM

## 2023-04-14 DIAGNOSIS — R7303 Prediabetes: Secondary | ICD-10-CM

## 2023-04-14 DIAGNOSIS — I10 Essential (primary) hypertension: Secondary | ICD-10-CM

## 2023-04-14 MED ORDER — AMLODIPINE BESYLATE 5 MG PO TABS
ORAL_TABLET | ORAL | 1 refills | Status: DC
Start: 2023-04-14 — End: 2024-01-01

## 2023-04-14 MED ORDER — FUROSEMIDE 20 MG PO TABS
20.0000 mg | ORAL_TABLET | Freq: Every day | ORAL | 1 refills | Status: DC | PRN
Start: 2023-04-14 — End: 2023-12-05

## 2023-04-14 NOTE — Patient Instructions (Signed)
-   please schedule appointment with a dentist to evaluate dental cavities  [facility] at [phone]  Address: [address] [place] [address]   

## 2023-04-14 NOTE — Progress Notes (Signed)
Provider: Richarda Blade FNP-C  Lakita Sahlin, Donalee Citrin, NP  Patient Care Team: Elsworth Ledin, Donalee Citrin, NP as PCP - General (Family Medicine) Quintella Reichert, MD as PCP - Cardiology (Cardiology) Vivi Barrack, DPM as Consulting Physician (Podiatry) Bond, Doran Stabler, MD as Referring Physician (Ophthalmology)  Extended Emergency Contact Information Primary Emergency Contact: Sanroman,Carl          [place] [address] [place] Home Phone: [phone] Mobile Phone: [phone] Relation: Son Secondary Emergency Contact: Beatriz Stallion [place] Home Phone: [phone] Mobile Phone: [phone] Relation: Daughter  Code Status: Full Code  Goals of care: Advanced Directive information    04/14/2023   10:50 AM  Advanced Directives  Does Patient Have a Medical Advance Directive? Yes  Type of Advance Directive Healthcare Power of Attorney  Does patient want to make changes to medical advance directive? No - Patient declined  Copy of Healthcare Power of Attorney in Chart? No - copy requested     Chief Complaint  Patient presents with   Follow-up    Medication follow-up, discuss rx for amlodipine. Patient is drinking an excessive amount of water. Discuss if patient should be on metoprolol, patient has not taken since Feb 2024. Patient c/o tooth ache x few days, question recommendation to a dentist. Here with son, Baldo Ash.     HPI:  Pt is a 89 y.o. female seen today for an acute visit for medication refill. Also complains of left lower tooth ache would like referral to dentist.she points to pre-molar tooth on left lower jaw.she denies any fever,chills or drainage.Son states has not seen any swelling on jaw or redness.  Son here with her concerned that patient has been drinking a lot of water and still feeling thirsty constantly. He states no polyphagia or polyuria.  Also states not taking metoprolol would like removed from med list.    Past Medical History:   Diagnosis Date   Anxiety    Asthma    Benign essential hypertension    Bradycardia, drug induced 06/11/2016   Edema extremities    GERD (gastroesophageal reflux disease)    Glaucoma    Macular degeneration    Per patient    Osteoarthritis    PVD (peripheral vascular disease) (HCC)    Seasonal allergies    Stroke [facility])    Past Surgical History:  Procedure Laterality Date   ABDOMINAL HYSTERECTOMY  1981   TONSILLECTOMY  1953    Allergies  Allergen Reactions   Aspirin Other (See Comments)    sweats   Codeine Other (See Comments)    Unknown reaction   Iodine Other (See Comments)    Pt is not aware of this allergy, does not recall much info.   Prednisone Other (See Comments)    sweats   Lyrica [Pregabalin]     Wobbly, "more than sleepy"   Benzonatate Itching and Rash   Sulfa Antibiotics Other (See Comments)    Doesn't remember Other reaction(s): Unknown    Outpatient Encounter Medications as of 04/14/2023  Medication Sig   acetaminophen (TYLENOL) 500 MG tablet Take 500 mg by mouth every 6 (six) hours as needed for mild pain or moderate pain.    alum hydroxide-mag trisilicate (GAVISCON) 80-20 MG CHEW chewable tablet Chew 2 tablets by mouth 2 (two) times daily.   atorvastatin (LIPITOR) 10 MG tablet TAKE 1 TABLET BY MOUTH EVERY DAY   atropine 1 % ophthalmic solution Place 1 drop into the right eye daily.  Brinzolamide-Brimonidine 1-0.2 % SUSP Place 1 drop into both eyes 3 (three) times daily.   budesonide-formoterol (SYMBICORT) 80-4.5 MCG/ACT inhaler Inhale 2 puffs into the lungs 2 (two) times daily.   Calcium Citrate-Vitamin D (CALCIUM CITRATE + D3) 200-6.25 MG-MCG TABS Take 1 tablet by mouth daily.   clopidogrel (PLAVIX) 75 MG tablet TAKE 1 TABLET BY MOUTH EVERY DAY   Coenzyme Q10 (COQ10) 50 MG CAPS Take 50 mg by mouth daily.   docusate sodium (COLACE) 100 MG capsule Take 100 mg by mouth 2 (two) times daily as needed for mild constipation.   Emollient (CETAPHIL) cream  Apply topically 2 (two) times daily. To legs   ferrous sulfate 325 (65 FE) MG EC tablet Take 325 mg by mouth daily.   hydrocortisone (ANUSOL-HC) 2.5 % rectal cream Place 1 application rectally daily as needed for hemorrhoids or anal itching.   KLOR-CON M20 20 MEQ tablet TAKE 1 TABLET BY MOUTH EVERY DAY   Multiple Vitamins-Minerals (PRESERVISION AREDS 2) CAPS Take 1 capsule by mouth 2 (two) times daily.   Multiple Vitamins-Minerals (SENIOR MULTIVITAMIN PLUS) TABS Take 1 tablet by mouth daily.    NONFORMULARY OR COMPOUNDED ITEM [facility]:  Peripheral Neuropathy Cream - Bupivacaine 1%, Doxepin 3%, Gabapentin 6%, Pentoxifylline 3%, Topiramate 1%, apply 1-2 grams to affected areas 3-4 times daily prn.   risperiDONE (RISPERDAL) 0.5 MG tablet TAKE 1 TABLET BY MOUTH TWICE A DAY   ROCKLATAN 0.02-0.005 % SOLN Place 1 drop into the left eye at bedtime.   vitamin C (ASCORBIC ACID) 500 MG tablet Take 500 mg by mouth daily.   [DISCONTINUED] amLODipine (NORVASC) 5 MG tablet TAKE 1 TABLET (5 MG TOTAL) BY MOUTH DAILY. PLEASE CALL [phone] TO SCHEDULE AN APPOINTMENT   [DISCONTINUED] furosemide (LASIX) 20 MG tablet TAKE 1 TABLET (20 MG TOTAL) BY MOUTH DAILY AS NEEDED FOR EDEMA.   amLODipine (NORVASC) 5 MG tablet TAKE 1 TABLET (5 MG TOTAL) BY MOUTH DAILY. PLEASE CALL [phone] TO SCHEDULE AN APPOINTMENT   furosemide (LASIX) 20 MG tablet Take 1 tablet (20 mg total) by mouth daily as needed for edema.   loratadine (CLARITIN) 10 MG tablet Take 10 mg by mouth daily.   [DISCONTINUED] docusate sodium (COLACE) 100 MG capsule TAKE 1 CAPSULE BY MOUTH TWICE A DAY   [DISCONTINUED] metoprolol tartrate (LOPRESSOR) 25 MG tablet Take 0.5 tablets (12.5 mg total) by mouth 2 (two) times daily. (Patient not taking: Reported on 04/14/2023)   No facility-administered encounter medications on file as of 04/14/2023.    Review of Systems  Constitutional:  Negative for appetite change, chills, fatigue, fever and unexpected  weight change.  HENT:  Positive for dental problem. Negative for congestion, ear discharge, ear pain, facial swelling, hearing loss, nosebleeds, postnasal drip, rhinorrhea, sinus pressure, sinus pain, sneezing, sore throat, tinnitus and trouble swallowing.        Left premolar tooth pain   Eyes:  Positive for visual disturbance. Negative for pain, discharge, redness and itching.       Blind   Respiratory:  Negative for cough, chest tightness, shortness of breath and wheezing.   Cardiovascular:  Positive for leg swelling. Negative for chest pain and palpitations.  Gastrointestinal:  Negative for abdominal distention, abdominal pain, blood in stool, constipation, diarrhea, nausea and vomiting.  Endocrine: Positive for polydipsia. Negative for cold intolerance, heat intolerance, polyphagia and polyuria.  Genitourinary:  Negative for difficulty urinating, dysuria, flank pain, frequency and urgency.       Incontinent   Musculoskeletal:  Positive  for gait problem. Negative for arthralgias, back pain, joint swelling, myalgias, neck pain and neck stiffness.  Skin:  Negative for color change, pallor, rash and wound.  Neurological:  Negative for dizziness, syncope, speech difficulty, weakness, light-headedness, numbness and headaches.  Hematological:  Does not bruise/bleed easily.  Psychiatric/Behavioral:  Negative for agitation, behavioral problems, confusion, hallucinations, self-injury, sleep disturbance and suicidal ideas. The patient is not nervous/anxious.     Immunization History  Administered Date(s) Administered   Fluad Quad(high Dose 65+) 06/04/2019, 07/21/2020   Influenza, High Dose Seasonal PF 10/19/2013, 08/02/2014, 08/11/2015, 11/22/2016, 07/16/2017, 11/21/2017, 07/20/2018, 11/19/2018, 11/18/2019, 11/17/2020, 11/28/2021, 07/23/2022   Influenza,inj,Quad PF,6+ Mos 06/11/2013, 06/30/2014, 07/17/2015, 06/27/2016   Influenza-Unspecified 07/07/2012   Moderna Sars-Covid-2 Vaccination 10/07/2022    PFIZER(Purple Top)SARS-COV-2 Vaccination 11/20/2019, 12/13/2019, 09/20/2020, 11/17/2020, 07/18/2021   Pfizer Covid-19 Vaccine Bivalent Booster 22yrs & up 07/18/2021   Pneumococcal Conjugate-13 11/07/2014   Pneumococcal Polysaccharide-23 10/08/2007, 08/11/2015, 11/22/2016, 11/21/2017, 11/19/2018, 11/18/2019, 11/17/2020, 11/28/2021   Unspecified SARS-COV-2 Vaccination 07/23/2022   Zoster Recombinant(Shingrix) 07/31/2017, 12/09/2017   Pertinent  Health Maintenance Due  Topic Date Due   INFLUENZA VACCINE  05/08/2023   DEXA SCAN  Completed      12/10/2022    1:32 PM 01/22/2023   10:28 AM 02/26/2023   11:54 AM 02/26/2023   12:38 PM 04/14/2023   10:50 AM  Fall Risk  Falls in the past year? 0 0 0 0 0  Was there an injury with Fall? 0 0 0 0 0  Fall Risk Category Calculator 0 0 0 0 0  Patient at Risk for Falls Due to  No Fall Risks No Fall Risks No Fall Risks No Fall Risks  Fall risk Follow up  Falls evaluation completed Falls evaluation completed Falls evaluation completed Falls evaluation completed   Functional Status Survey:    Vitals:   04/14/23 1051  BP: 128/80  Pulse: (!) 50  Temp: 97.9 F (36.6 C)  TempSrc: Temporal  SpO2: 98%   There is no height or weight on file to calculate BMI. Physical Exam Vitals reviewed.  Constitutional:      General: She is not in acute distress.    Appearance: Normal appearance. She is normal weight. She is not ill-appearing or diaphoretic.  HENT:     Head: Normocephalic.     Right Ear: Tympanic membrane, ear canal and external ear normal. There is no impacted cerumen.     Left Ear: Tympanic membrane, ear canal and external ear normal. There is no impacted cerumen.     Nose: Nose normal. No congestion or rhinorrhea.     Mouth/Throat:     Mouth: Mucous membranes are moist.     Pharynx: Oropharynx is clear. No oropharyngeal exudate or posterior oropharyngeal erythema.  Eyes:     General: No scleral icterus.       Right eye: No discharge.         Left eye: No discharge.     Conjunctiva/sclera: Conjunctivae normal.     Comments: Bilateral eye blindness   Neck:     Vascular: No carotid bruit.  Cardiovascular:     Rate and Rhythm: Normal rate and regular rhythm.     Pulses: Normal pulses.     Heart sounds: Normal heart sounds. No murmur heard.    No friction rub. No gallop.  Pulmonary:     Effort: Pulmonary effort is normal. No respiratory distress.     Breath sounds: Normal breath sounds. No wheezing, rhonchi or rales.  Chest:  Chest wall: No tenderness.  Abdominal:     General: Bowel sounds are normal. There is no distension.     Palpations: Abdomen is soft. There is no mass.     Tenderness: There is no abdominal tenderness. There is no right CVA tenderness, left CVA tenderness, guarding or rebound.  Musculoskeletal:        General: No swelling or tenderness. Normal range of motion.     Cervical back: Normal range of motion. No rigidity or tenderness.     Right lower leg: No edema.     Left lower leg: No edema.  Lymphadenopathy:     Cervical: No cervical adenopathy.  Skin:    General: Skin is warm and dry.     Coloration: Skin is not pale.     Findings: No bruising, erythema, lesion or rash.  Neurological:     Mental Status: She is alert and oriented to person, place, and time.     Cranial Nerves: No cranial nerve deficit.     Sensory: No sensory deficit.     Motor: No weakness.     Coordination: Coordination normal.     Gait: Gait abnormal.  Psychiatric:        Mood and Affect: Mood normal.        Speech: Speech normal.        Behavior: Behavior normal.        Thought Content: Thought content normal.        Judgment: Judgment normal.    Labs reviewed: Recent Labs    09/11/22 2204 09/11/22 2208 11/27/22 1143 01/22/23 1120  NA 138 136 140 141  K 4.3 4.5 4.0 3.8  CL 106 105 108 104  CO2 24  --  25 26  GLUCOSE 133* 131* 105* 79  BUN 18 21 20 13   CREATININE 1.04* 1.10* 0.74 0.75  CALCIUM 8.9  --  8.4*  9.2   Recent Labs    09/11/22 2204 11/27/22 1143  AST 26 17  ALT 23 14  ALKPHOS 67  --   BILITOT 0.8 0.5  PROT 6.3* 5.8*  ALBUMIN 3.3*  --    Recent Labs    09/11/22 2204 09/11/22 2208 11/27/22 1143 01/22/23 1120  WBC 6.4  --  5.9 6.4  NEUTROABS 3.5  --  3,086 3,366  HGB 11.6* 12.2 9.8* 11.9  HCT 33.6* 36.0 29.3* 35.2  MCV 91.6  --  92.4 90.0  PLT 187  --  270 248   Lab Results  Component Value Date   TSH 2.25 11/27/2022   Lab Results  Component Value Date   HGBA1C 5.4 09/12/2022   Lab Results  Component Value Date   CHOL 110 11/27/2022   HDL 50 11/27/2022   LDLCALC 47 11/27/2022   LDLDIRECT 28 09/12/2022   TRIG 45 11/27/2022   CHOLHDL 2.2 11/27/2022    Significant Diagnostic Results in last 30 days:  No results found.  Assessment/Plan 1. Essential hypertension B/p well controlled  - continue on amlodipine  - amLODipine (NORVASC) 5 MG tablet; TAKE 1 TABLET (5 MG TOTAL) BY MOUTH DAILY. PLEASE CALL [phone] TO SCHEDULE AN APPOINTMENT  Dispense: 90 tablet; Refill: 1  2. Edema of extremities Bilateral 1+ edema  - No signs of fluid overload  - continue on furosemide  - furosemide (LASIX) 20 MG tablet; Take 1 tablet (20 mg total) by mouth daily as needed for edema.  Dispense: 90 tablet; Refill: 1  3. Prediabetes Lab Results  Component Value  Date   HGBA1C 5.4 09/12/2022  Improved previous was 5.7 Son reports polydipsia.will recheck A1C  - Hemoglobin A1c  Family/ staff Communication: Reviewed plan of care with patient verbalized understanding.   Labs/tests ordered: None   Next Appointment: Return if symptoms worsen or fail to improve.   Caesar Bookman, NP

## 2023-04-15 LAB — HEMOGLOBIN A1C
Hgb A1c MFr Bld: 5.3 % of total Hgb (ref <5.7)
Mean Plasma Glucose: 105 mg/dL
eAG (mmol/L): 5.8 mmol/L

## 2023-04-29 ENCOUNTER — Telehealth: Payer: Medicare Other | Admitting: Family

## 2023-04-29 ENCOUNTER — Telehealth: Payer: Self-pay

## 2023-04-29 NOTE — Telephone Encounter (Signed)
Patient's son called to reschedule MyChart video for tomorrow.

## 2023-04-30 ENCOUNTER — Ambulatory Visit: Payer: Self-pay

## 2023-04-30 ENCOUNTER — Telehealth: Payer: Self-pay

## 2023-04-30 ENCOUNTER — Telehealth (INDEPENDENT_AMBULATORY_CARE_PROVIDER_SITE_OTHER): Payer: Medicare Other | Admitting: Family

## 2023-04-30 ENCOUNTER — Encounter: Payer: Self-pay | Admitting: Family

## 2023-04-30 VITALS — Ht 65.0 in

## 2023-04-30 DIAGNOSIS — L98429 Non-pressure chronic ulcer of back with unspecified severity: Secondary | ICD-10-CM

## 2023-04-30 NOTE — Patient Instructions (Signed)
Visit Information  Thank you for taking time to visit with me today. Please don't hesitate to contact me if I can be of assistance to you.   Following are the goals we discussed today:   Goals Addressed             This Visit's Progress    To continue to eat enough during meal times       Care Coordination Interventions: Evaluation of current treatment plan related to Nutritional support  and patient's adherence to plan as established by provider Completed successful outbound call with son Baldo Ash Discussed with son Baldo Ash, patient's appetite has improved, she is taking in more calories on a daily basis, still drinking Ensure between meals, son reports patient adequate weight gain since last nurse follow up  Reviewed upcoming scheduled tele-visit with PCP scheduled for today at 1:40 PM  Mailed coupons for Ensure          Our next appointment is by telephone on 07/31/23 at 10:30 AM  Please call the care guide team at [phone] if you need to cancel or reschedule your appointment.   If you are experiencing a Mental Health or Behavioral Health Crisis or need someone to talk to, please call 1-800-273-TALK (toll free, 24 hour hotline)  Patient verbalizes understanding of instructions and care plan provided today and agrees to view in MyChart. Active MyChart status and patient understanding of how to access instructions and care plan via MyChart confirmed with patient.     Delsa Sale, RN, BSN, CCM Care Management Coordinator [facility] Care Management  Direct Phone: [phone]

## 2023-04-30 NOTE — Progress Notes (Signed)
This service is provided via telemedicine  No vital signs collected/recorded due to the encounter was a telemedicine visit.   Location of patient (ex: home, work):  home  Patient consents to a telephone visit:  see consent dated 04/30/2023  Location of the provider (ex: office, home):  [facility]  Names of all persons participating in the telemedicine service and their role in the encounter:  patient, son carl, Richarda Blade, NP, Guss Bunde CCMA,CMAA   Time spent on call:  5 minutes  Location:      Place of Service:    Provider: Xavi Tomasik FNP-C  Tarynn Garling, Donalee Citrin, NP  Patient Care Team: Jin Shockley, Donalee Citrin, NP as PCP - General (Family Medicine) Quintella Reichert, MD as PCP - Cardiology (Cardiology) Vivi Barrack, DPM as Consulting Physician (Podiatry) Bond, Doran Stabler, MD as Referring Physician (Ophthalmology)  Extended Emergency Contact Information Primary Emergency Contact: Vaeth,Carl          [place] [address] [place] Home Phone: [phone] Mobile Phone: [phone] Relation: Son Secondary Emergency Contact: Beatriz Stallion [place] Home Phone: [phone] Mobile Phone: [phone] Relation: Daughter  Code Status: Full Code  Goals of care: Advanced Directive information    04/14/2023   10:50 AM  Advanced Directives  Does Patient Have a Medical Advance Directive? Yes  Type of Advance Directive Healthcare Power of Attorney  Does patient want to make changes to medical advance directive? No - Patient declined  Copy of Healthcare Power of Attorney in Chart? No - copy requested     Chief Complaint  Patient presents with   Medical Management of Chronic Issues    Patient is here for raised area/ open wound, possible bed sore on buttocks Link sent via text and email    HPI:  Pt is a 89 y.o. female seen today for an acute visit for evaluation of raised area /open wound on the buttock x 1  week.Patient's son provides HPI information states no drainage or redness on wound.patient stated slight pain.No fever or chills.Patient tends to lie on the back most of the time in bed.usually out of bed twice daily for breakfast and dinner and remains up for about 3 hrs each time.  Describes appetite as very good.     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   Edema extremities    GERD (gastroesophageal reflux disease)    Glaucoma    Macular degeneration    Per patient    Osteoarthritis    PVD (peripheral vascular disease) (HCC)    Seasonal allergies    Stroke [facility])    Past Surgical History:  Procedure Laterality Date   ABDOMINAL HYSTERECTOMY  1981   TONSILLECTOMY  1953    Allergies  Allergen Reactions   Aspirin Other (See Comments)    sweats   Codeine Other (See Comments)    Unknown reaction   Iodine Other (See Comments)    Pt is not aware of this allergy, does not recall much info.   Prednisone Other (See Comments)    sweats   Lyrica [Pregabalin]     Wobbly, "more than sleepy"   Benzonatate Itching and Rash   Sulfa Antibiotics Other (See Comments)    Doesn't remember Other reaction(s): Unknown    Outpatient Encounter Medications as of 04/30/2023  Medication Sig   acetaminophen (TYLENOL) 500  MG tablet Take 500 mg by mouth every 6 (six) hours as needed for mild pain or moderate pain.    alum hydroxide-mag trisilicate (GAVISCON) 80-20 MG CHEW chewable tablet Chew 2 tablets by mouth 2 (two) times daily.   amLODipine (NORVASC) 5 MG tablet TAKE 1 TABLET (5 MG TOTAL) BY MOUTH DAILY. PLEASE CALL [phone] TO SCHEDULE AN APPOINTMENT   atorvastatin (LIPITOR) 10 MG tablet TAKE 1 TABLET BY MOUTH EVERY DAY   atropine 1 % ophthalmic solution Place 1 drop into the right eye daily.    Brinzolamide-Brimonidine 1-0.2 % SUSP Place 1 drop into both eyes 3 (three) times daily.    budesonide-formoterol (SYMBICORT) 80-4.5 MCG/ACT inhaler Inhale 2 puffs into the lungs 2 (two) times daily.   Calcium Citrate-Vitamin D (CALCIUM CITRATE + D3) 200-6.25 MG-MCG TABS Take 1 tablet by mouth daily.   clopidogrel (PLAVIX) 75 MG tablet TAKE 1 TABLET BY MOUTH EVERY DAY   Coenzyme Q10 (COQ10) 50 MG CAPS Take 50 mg by mouth daily.   docusate sodium (COLACE) 100 MG capsule Take 100 mg by mouth 2 (two) times daily as needed for mild constipation.   Emollient (CETAPHIL) cream Apply topically 2 (two) times daily. To legs   ferrous sulfate 325 (65 FE) MG EC tablet Take 325 mg by mouth daily.   furosemide (LASIX) 20 MG tablet Take 1 tablet (20 mg total) by mouth daily as needed for edema.   hydrocortisone (ANUSOL-HC) 2.5 % rectal cream Place 1 application rectally daily as needed for hemorrhoids or anal itching.   KLOR-CON M20 20 MEQ tablet TAKE 1 TABLET BY MOUTH EVERY DAY   loratadine (CLARITIN) 10 MG tablet Take 10 mg by mouth daily.   Multiple Vitamins-Minerals (PRESERVISION AREDS 2) CAPS Take 1 capsule by mouth 2 (two) times daily.   Multiple Vitamins-Minerals (SENIOR MULTIVITAMIN PLUS) TABS Take 1 tablet by mouth daily.    NONFORMULARY OR COMPOUNDED ITEM [facility]:  Peripheral Neuropathy Cream - Bupivacaine 1%, Doxepin 3%, Gabapentin 6%, Pentoxifylline 3%, Topiramate 1%, apply 1-2 grams to affected areas 3-4 times daily prn.   risperiDONE (RISPERDAL) 0.5 MG tablet TAKE 1 TABLET BY MOUTH TWICE A DAY   ROCKLATAN 0.02-0.005 % SOLN Place 1 drop into the left eye at bedtime.   vitamin C (ASCORBIC ACID) 500 MG tablet Take 500 mg by mouth daily.   No facility-administered encounter medications on file as of 04/30/2023.    Review of Systems  Constitutional:  Negative for appetite change, chills, fatigue, fever and unexpected weight change.  Eyes:  Negative for pain, discharge, redness, itching and visual disturbance.  Respiratory:  Negative for cough, chest tightness, shortness of  breath and wheezing.   Cardiovascular:  Negative for chest pain, palpitations and leg swelling.  Gastrointestinal:  Negative for abdominal distention, abdominal pain, diarrhea, nausea and vomiting.  Genitourinary:  Negative for difficulty urinating, dysuria, flank pain and urgency.       Incontinent   Musculoskeletal:  Positive for gait problem. Negative for arthralgias, back pain, joint swelling and myalgias.  Skin:  Positive for wound. Negative for color change, pallor and rash.    Immunization History  Administered Date(s) Administered   Fluad Quad(high Dose 65+) 06/04/2019, 07/21/2020   Influenza, High Dose Seasonal PF 10/19/2013, 08/02/2014, 08/11/2015, 11/22/2016, 07/16/2017, 11/21/2017, 07/20/2018, 11/19/2018, 11/18/2019, 11/17/2020, 11/28/2021, 07/23/2022   Influenza,inj,Quad PF,6+ Mos 06/11/2013, 06/30/2014, 07/17/2015, 06/27/2016   Influenza-Unspecified 07/07/2012   Moderna Sars-Covid-2 Vaccination 10/07/2022   PFIZER(Purple Top)SARS-COV-2 Vaccination 11/20/2019, 12/13/2019, 09/20/2020, 11/17/2020, 07/18/2021   Pfizer  Covid-19 Vaccine Bivalent Booster 51yrs & up 07/18/2021   Pneumococcal Conjugate-13 11/07/2014   Pneumococcal Polysaccharide-23 10/08/2007, 08/11/2015, 11/22/2016, 11/21/2017, 11/19/2018, 11/18/2019, 11/17/2020, 11/28/2021   Unspecified SARS-COV-2 Vaccination 07/23/2022   Zoster Recombinant(Shingrix) 07/31/2017, 12/09/2017   Pertinent  Health Maintenance Due  Topic Date Due   INFLUENZA VACCINE  05/08/2023   DEXA SCAN  Completed      12/10/2022    1:32 PM 01/22/2023   10:28 AM 02/26/2023   11:54 AM 02/26/2023   12:38 PM 04/14/2023   10:50 AM  Fall Risk  Falls in the past year? 0 0 0 0 0  Was there an injury with Fall? 0 0 0 0 0  Fall Risk Category Calculator 0 0 0 0 0  Patient at Risk for Falls Due to  No Fall Risks No Fall Risks No Fall Risks No Fall Risks  Fall risk Follow up  Falls evaluation completed Falls evaluation completed Falls evaluation completed  Falls evaluation completed   Functional Status Survey:    Vitals:   04/30/23 0917  Height: 5\' 5"  (1.651 m)   Body mass index is 23.3 kg/m. Physical Exam Vitals reviewed.  Constitutional:      General: She is not in acute distress.    Appearance: Normal appearance. She is not ill-appearing.  Pulmonary:     Effort: Pulmonary effort is normal. No respiratory distress.  Musculoskeletal:        General: No swelling or tenderness. Normal range of motion.     Right lower leg: No edema.     Left lower leg: No edema.  Skin:    General: Skin is warm and dry.     Coloration: Skin is not pale.     Findings: No bruising, erythema or rash.     Comments: Unstageable ulcer on sacral area.  Neurological:     Mental Status: She is alert. Mental status is at baseline.     Gait: Gait abnormal.     Comments: Observed lying in bed   Psychiatric:        Mood and Affect: Mood normal.        Speech: Speech normal.        Behavior: Behavior normal.     Labs reviewed: Recent Labs    09/11/22 2204 09/11/22 2208 11/27/22 1143 01/22/23 1120  NA 138 136 140 141  K 4.3 4.5 4.0 3.8  CL 106 105 108 104  CO2 24  --  25 26  GLUCOSE 133* 131* 105* 79  BUN 18 21 20 13   CREATININE 1.04* 1.10* 0.74 0.75  CALCIUM 8.9  --  8.4* 9.2   Recent Labs    09/11/22 2204 11/27/22 1143  AST 26 17  ALT 23 14  ALKPHOS 67  --   BILITOT 0.8 0.5  PROT 6.3* 5.8*  ALBUMIN 3.3*  --    Recent Labs    09/11/22 2204 09/11/22 2208 11/27/22 1143 01/22/23 1120  WBC 6.4  --  5.9 6.4  NEUTROABS 3.5  --  3,086 3,366  HGB 11.6* 12.2 9.8* 11.9  HCT 33.6* 36.0 29.3* 35.2  MCV 91.6  --  92.4 90.0  PLT 187  --  270 248   Lab Results  Component Value Date   TSH 2.25 11/27/2022   Lab Results  Component Value Date   HGBA1C 5.3 04/14/2023   Lab Results  Component Value Date   CHOL 110 11/27/2022   HDL 50 11/27/2022   LDLCALC 47 11/27/2022   LDLDIRECT  28 09/12/2022   TRIG 45 11/27/2022   CHOLHDL 2.2  11/27/2022    Significant Diagnostic Results in last 30 days:  No results found.  Assessment/Plan  Ulcer of sacral region, unstageable (HCC) Unable to visually stage ulcer on video visit  - encourage oral intake  - D/w son to reposition from left to right while in bed and to avoid lying on her back to prevent further skin breakdown HHN to evaluate and manage ulcer.  - Ambulatory referral to Home Health  Family/ staff Communication: Reviewed plan of care with patient and son verbalized understanding   Labs/tests ordered: None   Next Appointment: Return if symptoms worsen or fail to improve.  I connected with  YAZLEEMAR STRASSNER on 04/30/23 by a video enabled telemedicine application and verified that I am speaking with the correct person using two identifiers.   I discussed the limitations of evaluation and management by telemedicine. The patient expressed understanding and agreed to proceed.   Spent 15 minutes of face to face with patient  >50% time spent counseling; reviewing medical record; labs; and developing future plan of care.    Caesar Bookman, NP

## 2023-04-30 NOTE — Telephone Encounter (Signed)
I connected with  [name] [name] on 04/30/23 by a video enabled telemedicine application and verified that I am speaking with the correct person using two identifiers.   I discussed the limitations of evaluation and management by telemedicine. The patient expressed understanding and agreed to proceed.   Consent give by son carl via telephone on 04/29/2023

## 2023-04-30 NOTE — Patient Outreach (Signed)
  Care Coordination   Follow Up Visit Note   04/30/2023 Name: [name] [name] MRN: [id number] DOB: 24-Jun-1924  [name] [name] is a 89 y.o. year old female who sees Ngetich, Donalee Citrin, NP for primary care. I spoke with  son [name] [name] by phone today.  What matters to the patients health and wellness today?  Patient's son would like for his mother to continue to eat well and maintain a healthy weight.     Goals Addressed             This Visit's Progress    To continue to eat enough during meal times       Care Coordination Interventions: Evaluation of current treatment plan related to Nutritional support  and patient's adherence to plan as established by provider Completed successful outbound call with son [name] [name] Discussed with son [name] [name], patient's appetite has improved, she is taking in more calories on a daily basis, still drinking Ensure between meals, son reports patient adequate weight gain since last nurse follow up  Reviewed upcoming scheduled tele-visit with PCP scheduled for today at 1:40 PM  Mailed coupons for Ensure    Interventions Today    Flowsheet Row Most Recent Value  General Interventions   General Interventions Discussed/Reviewed General Interventions Discussed, General Interventions Reviewed, Doctor Visits  Doctor Visits Discussed/Reviewed Doctor Visits Discussed, Doctor Visits Reviewed, PCP  Education Interventions   Education Provided Provided Education, Provided Printed Education  Provided Verbal Education On Nutrition, Labs  Labs Reviewed Kidney Function, Hgb A1c  Nutrition Interventions   Nutrition Discussed/Reviewed Nutrition Discussed, Nutrition Reviewed, Supplemental nutrition          SDOH assessments and interventions completed:  No     Care Coordination Interventions:  Yes, provided   Follow up plan: Follow up call scheduled for 07/31/23 @10 :30 AM    Encounter Outcome:  Pt. Visit Completed

## 2023-05-01 ENCOUNTER — Emergency Department (HOSPITAL_COMMUNITY)
Admission: EM | Admit: 2023-05-01 | Discharge: 2023-05-02 | Disposition: A | Discharge status: Home / Self Care | Payer: Medicare Other | Attending: Emergency Medicine | Admitting: Emergency Medicine

## 2023-05-01 ENCOUNTER — Encounter (HOSPITAL_COMMUNITY): Payer: Self-pay | Admitting: Emergency Medicine

## 2023-05-01 ENCOUNTER — Other Ambulatory Visit: Payer: Self-pay

## 2023-05-01 DIAGNOSIS — I10 Essential (primary) hypertension: Secondary | ICD-10-CM | POA: Diagnosis not present

## 2023-05-01 DIAGNOSIS — X58XXXA Exposure to other specified factors, initial encounter: Secondary | ICD-10-CM | POA: Diagnosis not present

## 2023-05-01 DIAGNOSIS — R011 Cardiac murmur, unspecified: Secondary | ICD-10-CM | POA: Insufficient documentation

## 2023-05-01 DIAGNOSIS — Z79899 Other long term (current) drug therapy: Secondary | ICD-10-CM | POA: Insufficient documentation

## 2023-05-01 DIAGNOSIS — R8289 Other abnormal findings on cytological and histological examination of urine: Secondary | ICD-10-CM | POA: Insufficient documentation

## 2023-05-01 DIAGNOSIS — S3992XA Unspecified injury of lower back, initial encounter: Secondary | ICD-10-CM | POA: Diagnosis present

## 2023-05-01 DIAGNOSIS — R600 Localized edema: Secondary | ICD-10-CM | POA: Insufficient documentation

## 2023-05-01 DIAGNOSIS — S30810A Abrasion of lower back and pelvis, initial encounter: Secondary | ICD-10-CM | POA: Diagnosis not present

## 2023-05-01 DIAGNOSIS — J45909 Unspecified asthma, uncomplicated: Secondary | ICD-10-CM | POA: Insufficient documentation

## 2023-05-01 DIAGNOSIS — Z7902 Long term (current) use of antithrombotics/antiplatelets: Secondary | ICD-10-CM | POA: Diagnosis not present

## 2023-05-01 DIAGNOSIS — K921 Melena: Secondary | ICD-10-CM | POA: Diagnosis not present

## 2023-05-01 DIAGNOSIS — R58 Hemorrhage, not elsewhere classified: Secondary | ICD-10-CM | POA: Diagnosis not present

## 2023-05-01 LAB — CBC WITH DIFFERENTIAL/PLATELET
Abs Immature Granulocytes: 0.01 10*3/uL (ref 0.00–0.07)
Basophils Absolute: 0 10*3/uL (ref 0.0–0.1)
Basophils Relative: 1 %
Eosinophils Absolute: 0.2 10*3/uL (ref 0.0–0.5)
Eosinophils Relative: 2 %
HCT: 34 % — ABNORMAL LOW (ref 36.0–46.0)
Hemoglobin: 11.6 g/dL — ABNORMAL LOW (ref 12.0–15.0)
Immature Granulocytes: 0 %
Lymphocytes Relative: 45 %
Lymphs Abs: 3.4 10*3/uL (ref 0.7–4.0)
MCH: 30.6 pg (ref 26.0–34.0)
MCHC: 34.1 g/dL (ref 30.0–36.0)
MCV: 89.7 fL (ref 80.0–100.0)
Monocytes Absolute: 0.7 10*3/uL (ref 0.1–1.0)
Monocytes Relative: 9 %
Neutro Abs: 3.3 10*3/uL (ref 1.7–7.7)
Neutrophils Relative %: 43 %
Platelets: 243 10*3/uL (ref 150–400)
RBC: 3.79 MIL/uL — ABNORMAL LOW (ref 3.87–5.11)
RDW: 16.3 % — ABNORMAL HIGH (ref 11.5–15.5)
WBC: 7.7 10*3/uL (ref 4.0–10.5)
nRBC: 0 % (ref 0.0–0.2)

## 2023-05-01 LAB — COMPREHENSIVE METABOLIC PANEL
ALT: 17 U/L (ref 0–44)
AST: 27 U/L (ref 15–41)
Albumin: 3.5 g/dL (ref 3.5–5.0)
Alkaline Phosphatase: 79 U/L (ref 38–126)
Anion gap: 10 (ref 5–15)
BUN: 18 mg/dL (ref 8–23)
CO2: 23 mmol/L (ref 22–32)
Calcium: 9.5 mg/dL (ref 8.9–10.3)
Chloride: 105 mmol/L (ref 98–111)
Creatinine, Ser: 0.7 mg/dL (ref 0.44–1.00)
GFR, Estimated: >60 mL/min (ref >60)
Glucose, Bld: 103 mg/dL — ABNORMAL HIGH (ref 70–99)
Potassium: 4.1 mmol/L (ref 3.5–5.1)
Sodium: 138 mmol/L (ref 135–145)
Total Bilirubin: 1 mg/dL (ref 0.3–1.2)
Total Protein: 7.2 g/dL (ref 6.5–8.1)

## 2023-05-01 LAB — URINALYSIS, W/ REFLEX TO CULTURE (INFECTION SUSPECTED)
Bilirubin Urine: NEGATIVE
Glucose, UA: NEGATIVE mg/dL
Ketones, ur: NEGATIVE mg/dL
Nitrite: NEGATIVE
Protein, ur: NEGATIVE mg/dL
Specific Gravity, Urine: 1.004 — ABNORMAL LOW (ref 1.005–1.030)
pH: 7 (ref 5.0–8.0)

## 2023-05-01 LAB — URINE CULTURE

## 2023-05-01 LAB — POC OCCULT BLOOD, ED: Fecal Occult Bld: NEGATIVE

## 2023-05-01 NOTE — ED Triage Notes (Signed)
Pt BIB [facility] from home due to rectal bleeding noticed today from caregiver.  Caregiver reports she was not bleeding yesterday.  Pt is blind.  Hx HTN.  Pt does take Plavix.  VS BP 182/86, HR 67, SpO2 98%

## 2023-05-01 NOTE — ED Provider Notes (Signed)
[place] EMERGENCY DEPARTMENT AT [facility] Provider Note   CSN: [id number] Arrival date & time: 05/01/23  1815     History  No chief complaint on file.   [name] [name] is a 89 y.o. female.  Patient is a 89 year old female with a history of hypertension, asthma, blindness, PVD on Plavix and GERD who is presenting today because the caregiver saw blood in her diaper when she was cleaning her today.  They said yesterday was a normal day and today around 5 PM they went to clean her and it was bright blood and then some clots.  Patient reports she feels fine she is not having any abdominal pain, chest pain, shortness of breath, feeling dizzy.  She has not had any nausea and reports she has had breakfast this morning.  She does take Plavix but denies any history of bleeding.  The history is provided by the patient, the EMS personnel and a caregiver.       Home Medications Prior to Admission medications   Medication Sig Start Date End Date Taking? Authorizing Provider  acetaminophen (TYLENOL) 500 MG tablet Take 500 mg by mouth every 6 (six) hours as needed for mild pain or moderate pain.     [provider]  alum hydroxide-mag trisilicate (GAVISCON) 80-20 MG CHEW chewable tablet Chew 2 tablets by mouth 2 (two) times daily.    [provider]  amLODipine (NORVASC) 5 MG tablet TAKE 1 TABLET (5 MG TOTAL) BY MOUTH DAILY. PLEASE CALL [phone] TO SCHEDULE AN APPOINTMENT 04/14/23   Ngetich, Dinah C, NP  atorvastatin (LIPITOR) 10 MG tablet TAKE 1 TABLET BY MOUTH EVERY DAY 09/24/22   Ngetich, Dinah C, NP  atropine 1 % ophthalmic solution Place 1 drop into the right eye daily.  06/30/19   [provider]  Brinzolamide-Brimonidine 1-0.2 % SUSP Place 1 drop into both eyes 3 (three) times daily.    [provider]  budesonide-formoterol (SYMBICORT) 80-4.5 MCG/ACT inhaler Inhale 2 puffs into the lungs 2 (two) times daily.    [provider]   Calcium Citrate-Vitamin D (CALCIUM CITRATE + D3) 200-6.25 MG-MCG TABS Take 1 tablet by mouth daily.    [provider]  clopidogrel (PLAVIX) 75 MG tablet TAKE 1 TABLET BY MOUTH EVERY DAY 02/17/23   Ngetich, Dinah C, NP  Coenzyme Q10 (COQ10) 50 MG CAPS Take 50 mg by mouth daily.    [provider]  docusate sodium (COLACE) 100 MG capsule Take 100 mg by mouth 2 (two) times daily as needed for mild constipation.    [provider]  Emollient (CETAPHIL) cream Apply topically 2 (two) times daily. To legs 02/05/22   Ngetich, Dinah C, NP  ferrous sulfate 325 (65 FE) MG EC tablet Take 325 mg by mouth daily.    [provider]  furosemide (LASIX) 20 MG tablet Take 1 tablet (20 mg total) by mouth daily as needed for edema. 04/14/23   Ngetich, Dinah C, NP  hydrocortisone (ANUSOL-HC) 2.5 % rectal cream Place 1 application rectally daily as needed for hemorrhoids or anal itching. 02/03/20   Reed, Tiffany L, DO  KLOR-CON M20 20 MEQ tablet TAKE 1 TABLET BY MOUTH EVERY DAY 03/10/23   Ngetich, Dinah C, NP  loratadine (CLARITIN) 10 MG tablet Take 10 mg by mouth daily. 08/16/21   [provider]  Multiple Vitamins-Minerals (PRESERVISION AREDS 2) CAPS Take 1 capsule by mouth 2 (two) times daily.    [provider]  Multiple  Vitamins-Minerals (SENIOR MULTIVITAMIN PLUS) TABS Take 1 tablet by mouth daily.     [provider]  NONFORMULARY OR COMPOUNDED ITEM [facility]:  Peripheral Neuropathy Cream - Bupivacaine 1%, Doxepin 3%, Gabapentin 6%, Pentoxifylline 3%, Topiramate 1%, apply 1-2 grams to affected areas 3-4 times daily prn. 09/11/22   Ngetich, Dinah C, NP  risperiDONE (RISPERDAL) 0.5 MG tablet TAKE 1 TABLET BY MOUTH TWICE A DAY 07/22/22   Eubanks, Janene Harvey, NP  ROCKLATAN 0.02-0.005 % SOLN Place 1 drop into the left eye at bedtime. 04/23/21   [provider]  vitamin C (ASCORBIC ACID) 500 MG tablet Take 500 mg by mouth daily.    [provider]      Allergies    Aspirin, Codeine, Iodine, Prednisone, Lyrica [pregabalin], Benzonatate, and Sulfa antibiotics    Review of Systems   Review of Systems  Physical Exam Updated Vital Signs BP (!) 174/77   Pulse 63   Temp 98.3 F (36.8 C) (Oral)   Resp 16   Ht 5' (1.524 m)   Wt 63.5 kg   SpO2 100%   BMI 27.34 kg/m  Physical Exam Vitals and nursing note reviewed.  Constitutional:      General: She is not in acute distress.    Appearance: She is well-developed.  HENT:     Head: Normocephalic and atraumatic.  Eyes:     Comments: Bilateral eyes are opacified, blindness  Cardiovascular:     Rate and Rhythm: Normal rate and regular rhythm.     Pulses: Normal pulses.     Heart sounds: Murmur heard.     No friction rub.     Comments: Faint 1 out of 6 systolic murmur Pulmonary:     Effort: Pulmonary effort is normal.     Breath sounds: Normal breath sounds. No wheezing or rales.  Abdominal:     General: Bowel sounds are normal. There is no distension.     Palpations: Abdomen is soft.     Tenderness: There is no abdominal tenderness. There is no guarding or rebound.  Genitourinary:    Rectum: Guaiac result negative.     Comments: Stool is a green-brown consistency with no obvious signs of blood.  No obvious signs of blood coming from the vagina from external inspection.  No blood in the pamper Musculoskeletal:        General: No tenderness. Normal range of motion.     Comments: Trace edema at the sock line bilaterally  Skin:    General: Skin is warm and dry.     Findings: No rash.  Neurological:     Mental Status: She is alert and oriented to person, place, and time. Mental status is at baseline.     Cranial Nerves: No cranial nerve deficit.  Psychiatric:        Mood and Affect: Mood normal.        Behavior: Behavior normal.     ED Results / Procedures / Treatments   Labs (all labs ordered are listed, but only abnormal results are displayed) Labs  Reviewed  CBC WITH DIFFERENTIAL/PLATELET - Abnormal; Notable for the following components:      Result Value   RBC 3.79 (*)    Hemoglobin 11.6 (*)    HCT 34.0 (*)    RDW 16.3 (*)    All other components within normal limits  COMPREHENSIVE METABOLIC PANEL - Abnormal; Notable for the following components:   Glucose, Bld 103 (*)    All other  components within normal limits  URINALYSIS, W/ REFLEX TO CULTURE (INFECTION SUSPECTED) - Abnormal; Notable for the following components:   APPearance CLOUDY (*)    Specific Gravity, Urine 1.004 (*)    Hgb urine dipstick SMALL (*)    Leukocytes,Ua SMALL (*)    Bacteria, UA FEW (*)    All other components within normal limits  URINE CULTURE  POC OCCULT BLOOD, ED    EKG None  Radiology No results found.  Procedures Procedures    Medications Ordered in ED Medications - No data to display  ED Course/ Medical Decision Making/ A&P                             Medical Decision Making Amount and/or Complexity of Data Reviewed Labs: ordered. Decision-making details documented in ED Course.   Pt with multiple medical problems and comorbidities and presenting today with a complaint that caries a high risk for morbidity and mortality.  Here today because caregiver reports that there was blood in her diaper.  Patient has no specific complaints but she does take Plavix and concern for GI bleed versus urinary source of the bleed versus vaginal bleeding.  However patient has had an abdominal hysterectomy so vaginal bleeding would be highly unlikely.  On inspection patient has no evidence of rectal bleeding and Hemoccult is negative.  Will get urine to ensure the blood is not coming from the urine.  Patient is well-appearing with hypertension today but otherwise normal vital signs. I independently interpreted patient's labs and UA with 21-50 red cells but no evidence of clots and 21-50 white cells but it was a clean-catch and most likely contaminant.  Low  suspicion for UTI at this time.  Hemoccult was negative, CBC with stable hemoglobin of 11.6 and CMP within normal limits.  Patient does have a very small area of skin that is irritated at 12:00 on her rectum but there is no evidence of bleeding at this time.  All the findings were discussed with her daughter Dois Davenport and her son who she lives with.  At this time patient appears stable for discharge.  They are comfortable with this plan.  No indication for admission or further testing at this time.         Final Clinical Impression(s) / ED Diagnoses Final diagnoses:  Abrasion of sacral region    Rx / DC Orders ED Discharge Orders     None         Gwyneth Sprout, MD 05/01/23 2052

## 2023-05-01 NOTE — Discharge Instructions (Signed)
There is no signs of bleeding here today.  The stool has no blood in it and no bleeding from the vagina.  There is a very minimal amount of blood in the urine but no signs of clot or infection at this time.  A culture of the urine was done in case there is infection we do not know about but white blood cell count and red blood cell count were normal today.  Continue doing the Neosporin on the area make sure she does not lay on her back too long so her skin does not breakdown.  Otherwise everything looks very good today.

## 2023-05-02 DIAGNOSIS — Z7401 Bed confinement status: Secondary | ICD-10-CM | POA: Diagnosis not present

## 2023-05-02 DIAGNOSIS — I10 Essential (primary) hypertension: Secondary | ICD-10-CM | POA: Diagnosis not present

## 2023-05-02 LAB — URINE CULTURE

## 2023-05-05 ENCOUNTER — Other Ambulatory Visit: Payer: Self-pay | Admitting: Family

## 2023-05-20 ENCOUNTER — Ambulatory Visit: Payer: Federal, State, Local not specified - PPO | Admitting: Podiatry

## 2023-05-20 ENCOUNTER — Ambulatory Visit: Payer: Medicare Other | Admitting: Podiatry

## 2023-06-04 ENCOUNTER — Encounter: Payer: Self-pay | Admitting: Podiatry

## 2023-06-04 ENCOUNTER — Ambulatory Visit: Payer: Medicare Other | Admitting: Podiatry

## 2023-06-04 DIAGNOSIS — G5793 Unspecified mononeuropathy of bilateral lower limbs: Secondary | ICD-10-CM

## 2023-06-04 DIAGNOSIS — B351 Tinea unguium: Secondary | ICD-10-CM | POA: Diagnosis not present

## 2023-06-04 DIAGNOSIS — M79675 Pain in left toe(s): Secondary | ICD-10-CM | POA: Diagnosis not present

## 2023-06-04 DIAGNOSIS — M79674 Pain in right toe(s): Secondary | ICD-10-CM

## 2023-06-04 NOTE — Progress Notes (Signed)
  Subjective:  [name], female    DOB: May 09, 1924,  MRN: [id number]  89 y.o. female presents for  Chief Complaint  Patient presents with   Nail Problem    Pt present today for a RFC. Pt stated that her 4th toenail on the right is loose and it was hurting her some days ago.   concern of thickened elongated and painful nails that are difficult to trim. Requesting to have them trimmed today.  PCP:  Ngetich, Donalee Citrin, NP     Patient is blind.   Allergies  Allergen Reactions   Aspirin Other (See Comments)    sweats   Codeine Other (See Comments)    Unknown reaction   Iodine Other (See Comments)    Pt is not aware of this allergy, does not recall much info.   Prednisone Other (See Comments)    sweats   Lyrica [Pregabalin]     Wobbly, "more than sleepy"   Benzonatate Itching and Rash   Sulfa Antibiotics Other (See Comments)    Doesn't remember Other reaction(s): Unknown    Review of Systems: Negative except as noted in the HPI.   Objective:  TEKA WATTS is a pleasant 89 y.o. female WD, WN in NAD.Marland Kitchen AAO x 3.  Vascular Examination: Vascular status intact b/l with palpable pedal pulses. CFT immediate b/l. Pedal hair present.No edema. No pain with calf compression b/l. Skin temperature gradient WNL b/l. No varicosities noted.  Neurological Examination: Sensation grossly intact b/l with 10 gram monofilament. Vibratory sensation intact b/l.  Dermatological Examination: Pedal skin with normal turgor, texture and tone b/l. No open wounds nor interdigital macerations noted. Toenails 1-5 b/l thick, discolored, elongated with subungual debris and pain on dorsal palpation. No hyperkeratotic lesions noted b/l.   Musculoskeletal Examination: Muscle strength 5/5 to b/l LE.  No pain, crepitus noted b/l. No gross pedal deformities. She is sitting up in a wheelchair on today's visit.   Radiographs: None Last A1c:      Latest Ref Rng & Units 04/14/2023   11:38 AM  09/12/2022    3:35 AM  Hemoglobin A1C  Hemoglobin-A1c <5.7 % of total Hgb 5.3  5.4      Assessment:   1. Pain due to onychomycosis of toenails of both feet   2. Neuropathy of both feet    Plan:   -Mechanically debrided all nails 1-5 bilateral using sterile nail nipper and filed with dremel without incident  -Answered all patient questions -Patient to return  in 3 months for at risk foot care -Patient advised to call the office if any problems or questions arise in the meantime.   No follow-ups on file.  Louann Sjogren, DPM

## 2023-06-10 ENCOUNTER — Telehealth: Payer: Self-pay

## 2023-06-10 NOTE — Telephone Encounter (Signed)
Patient's son called stating that patient has a scratch on her bottom doesn't know if it's an ulcer,but would like for a nurse to come out and check her.  Message sent to Richarda Blade, NP

## 2023-06-10 NOTE — Telephone Encounter (Signed)
Will need a face to face visit to evaluate then order [facility]

## 2023-06-12 NOTE — Telephone Encounter (Signed)
Tried calling, no answer. Message left for patient's son to call office.

## 2023-06-12 NOTE — Telephone Encounter (Signed)
Spoke with patient's son and he verbalized his understanding. He says that he will call back to schedule appointment.

## 2023-07-09 ENCOUNTER — Encounter: Payer: Self-pay | Admitting: Family

## 2023-07-09 ENCOUNTER — Encounter: Payer: Medicare Other | Admitting: Family

## 2023-07-09 NOTE — Progress Notes (Deleted)
This service is provided via telemedicine  No vital signs collected/recorded due to the encounter was a telemedicine visit.   Location of patient (ex: home, work):  Home  Patient consents to a telephone visit: Yes  Location of the provider (ex: office, home):  [facility], Office   Name of any referring provider:  N/A  Names of all persons participating in the telemedicine service and their role in the encounter:  S.Chrae B/CMA, Abbey Chatters, NP, and Baldo Ash (son), Arley Phenix (care-aid), Patient   Time spent on call:  9 min with medical assistant    Provider: Carilyn Goodpasture Zenith Kercheval FNP-C   Blaire Hodsdon, Donalee Citrin, NP  Patient Care Team: Janayla Marik, Donalee Citrin, NP as PCP - General (Family Medicine) Quintella Reichert, MD as PCP - Cardiology (Cardiology) Vivi Barrack, DPM as Consulting Physician (Podiatry) Bond, Doran Stabler, MD as Referring Physician (Ophthalmology)  Extended Emergency Contact Information Primary Emergency Contact: Beatriz Stallion [place] Home Phone: [phone] Mobile Phone: [phone] Relation: Daughter Secondary Emergency Contact: Westerhold,Carl          [place] [address] [place] Home Phone: [phone] Mobile Phone: [phone] Relation: Son  Code Status:  Full Code  Goals of care: Advanced Directive information    05/01/2023    6:23 PM  Advanced Directives  Does Patient Have a Medical Advance Directive? No  Does patient want to make changes to medical advance directive? No - Patient declined     Chief Complaint  Patient presents with   Follow-up     4 month f/u. Discuss need for flu vaccine, covid booster, and AWV. Patient with area of concern on her inner thigh x 3 days. Patients will try to upload pictures to mychart.      HPI:  Pt is a 89 y.o. female seen today for 4 month follow up for medical management of chronic diseases.    Patient with area of concern on her inner thigh x 3 days.  Patients will try to upload pictures to mychart.   Discuss need for flu vaccine, covid booster, and AWV. 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   Edema extremities    GERD (gastroesophageal reflux disease)    Glaucoma    Macular degeneration    Per patient    Osteoarthritis    PVD (peripheral vascular disease) (HCC)    Seasonal allergies    Stroke [facility])    Past Surgical History:  Procedure Laterality Date   ABDOMINAL HYSTERECTOMY  1981   TONSILLECTOMY  1953    Allergies  Allergen Reactions   Aspirin Other (See Comments)    sweats   Codeine Other (See Comments)    Unknown reaction   Iodine Other (See Comments)    Pt is not aware of this allergy, does not recall much info.   Prednisone Other (See Comments)    sweats   Lyrica [Pregabalin]     Wobbly, "more than sleepy"   Benzonatate Itching and Rash   Sulfa Antibiotics Other (See Comments)    Doesn't remember Other reaction(s): Unknown    Allergies as of 07/09/2023       Reactions   Aspirin Other (See Comments)   sweats   Codeine Other (See Comments)   Unknown reaction   Iodine Other (See Comments)   Pt is not aware of this allergy, does not recall  much info.   Prednisone Other (See Comments)   sweats   Lyrica [pregabalin]    Wobbly, "more than sleepy"   Benzonatate Itching, Rash   Sulfa Antibiotics Other (See Comments)   Doesn't remember Other reaction(s): Unknown        Medication List        Accurate as of July 09, 2023  1:12 PM. If you have any questions, ask your nurse or doctor.          STOP taking these medications    atropine 1 % ophthalmic solution Stopped by: Donalee Citrin Kailan Carmen   loratadine 10 MG tablet Commonly known as: CLARITIN Stopped by: Donalee Citrin Calvert Charland       TAKE these medications    acetaminophen 500 MG tablet Commonly known as: TYLENOL Take 500 mg by mouth every 6 (six) hours  as needed for mild pain or moderate pain.   alum hydroxide-mag trisilicate 80-20 MG Chew chewable tablet Commonly known as: GAVISCON Chew 2 tablets by mouth 2 (two) times daily.   amLODipine 5 MG tablet Commonly known as: NORVASC TAKE 1 TABLET (5 MG TOTAL) BY MOUTH DAILY. PLEASE CALL [phone] TO SCHEDULE AN APPOINTMENT   ascorbic acid 500 MG tablet Commonly known as: VITAMIN C Take 500 mg by mouth daily.   atorvastatin 10 MG tablet Commonly known as: LIPITOR TAKE 1 TABLET BY MOUTH EVERY DAY   Brinzolamide-Brimonidine 1-0.2 % Susp Apply 1 drop to eye 3 (three) times daily. Both eyes What changed: Another medication with the same name was removed. Continue taking this medication, and follow the directions you see here. Changed by: Donalee Citrin Shaquille Janes   budesonide-formoterol 80-4.5 MCG/ACT inhaler Commonly known as: SYMBICORT Inhale 2 puffs into the lungs 2 (two) times daily.   Calcium Citrate + D3 200-6.25 MG-MCG Tabs Generic drug: Calcium Citrate-Vitamin D Take 1 tablet by mouth daily.   cetaphil cream Apply topically 2 (two) times daily. To legs   clopidogrel 75 MG tablet Commonly known as: PLAVIX TAKE 1 TABLET BY MOUTH EVERY DAY   CoQ10 50 MG Caps Take 50 mg by mouth daily.   docusate sodium 100 MG capsule Commonly known as: COLACE Take 100 mg by mouth 2 (two) times daily as needed for mild constipation.   ferrous sulfate 325 (65 FE) MG EC tablet Take 325 mg by mouth daily.   furosemide 20 MG tablet Commonly known as: LASIX Take 1 tablet (20 mg total) by mouth daily as needed for edema.   hydrocortisone 2.5 % rectal cream Commonly known as: Anusol-HC Place 1 application rectally daily as needed for hemorrhoids or anal itching.   Klor-Con M20 20 MEQ tablet Generic drug: potassium chloride SA TAKE 1 TABLET BY MOUTH EVERY DAY   NONFORMULARY OR COMPOUNDED ITEM [facility]:  Peripheral Neuropathy Cream - Bupivacaine 1%, Doxepin 3%, Gabapentin 6%,  Pentoxifylline 3%, Topiramate 1%, apply 1-2 grams to affected areas 3-4 times daily prn.   risperiDONE 0.5 MG tablet Commonly known as: RISPERDAL TAKE 1 TABLET BY MOUTH TWICE A DAY   Rocklatan 0.02-0.005 % Soln Generic drug: Netarsudil-Latanoprost Place 1 drop into the left eye at bedtime.   PreserVision AREDS 2 Caps Take 1 capsule by mouth 2 (two) times daily.   Senior Multivitamin Plus Tabs Take 1 tablet by mouth daily.        Review of Systems  Immunization History  Administered Date(s) Administered   Fluad Quad(high Dose 65+) 06/04/2019, 07/21/2020   Influenza, High Dose Seasonal PF 10/19/2013, 08/02/2014,  08/11/2015, 11/22/2016, 07/16/2017, 11/21/2017, 07/20/2018, 11/19/2018, 11/18/2019, 11/17/2020, 11/28/2021, 07/23/2022   Influenza,inj,Quad PF,6+ Mos 06/11/2013, 06/30/2014, 07/17/2015, 06/27/2016   Influenza-Unspecified 07/07/2012   Moderna Sars-Covid-2 Vaccination 10/07/2022   PFIZER(Purple Top)SARS-COV-2 Vaccination 11/20/2019, 12/13/2019, 09/20/2020, 11/17/2020, 07/18/2021   Pfizer Covid-19 Vaccine Bivalent Booster 23yrs & up 07/18/2021   Pneumococcal Conjugate-13 11/07/2014   Pneumococcal Polysaccharide-23 10/08/2007, 08/11/2015, 11/22/2016, 11/21/2017, 11/19/2018, 11/18/2019, 11/17/2020, 11/28/2021   Unspecified SARS-COV-2 Vaccination 07/23/2022   Zoster Recombinant(Shingrix) 07/31/2017, 12/09/2017   Pertinent  Health Maintenance Due  Topic Date Due   INFLUENZA VACCINE  05/08/2023   DEXA SCAN  Completed      12/10/2022    1:32 PM 01/22/2023   10:28 AM 02/26/2023   11:54 AM 02/26/2023   12:38 PM 04/14/2023   10:50 AM  Fall Risk  Falls in the past year? 0 0 0 0 0  Was there an injury with Fall? 0 0 0 0 0  Fall Risk Category Calculator 0 0 0 0 0  Patient at Risk for Falls Due to  No Fall Risks No Fall Risks No Fall Risks No Fall Risks  Fall risk Follow up  Falls evaluation completed Falls evaluation completed Falls evaluation completed Falls evaluation completed    Functional Status Survey:    There were no vitals filed for this visit. There is no height or weight on file to calculate BMI. Physical Exam  Labs reviewed: Recent Labs    11/27/22 1143 01/22/23 1120 05/01/23 1834  NA 140 141 138  K 4.0 3.8 4.1  CL 108 104 105  CO2 25 26 23   GLUCOSE 105* 79 103*  BUN 20 13 18   CREATININE 0.74 0.75 0.70  CALCIUM 8.4* 9.2 9.5   Recent Labs    09/11/22 2204 11/27/22 1143 05/01/23 1834  AST 26 17 27   ALT 23 14 17   ALKPHOS 67  --  79  BILITOT 0.8 0.5 1.0  PROT 6.3* 5.8* 7.2  ALBUMIN 3.3*  --  3.5   Recent Labs    11/27/22 1143 01/22/23 1120 05/01/23 1834  WBC 5.9 6.4 7.7  NEUTROABS 3,086 3,366 3.3  HGB 9.8* 11.9 11.6*  HCT 29.3* 35.2 34.0*  MCV 92.4 90.0 89.7  PLT 270 248 243   Lab Results  Component Value Date   TSH 2.25 11/27/2022   Lab Results  Component Value Date   HGBA1C 5.3 04/14/2023   Lab Results  Component Value Date   CHOL 110 11/27/2022   HDL 50 11/27/2022   LDLCALC 47 11/27/2022   LDLDIRECT 28 09/12/2022   TRIG 45 11/27/2022   CHOLHDL 2.2 11/27/2022    Significant Diagnostic Results in last 30 days:  No results found.  Assessment/Plan There are no diagnoses linked to this encounter.   Family/ staff Communication: Reviewed plan of care with patient  Labs/tests ordered: None   Next Appointment :   Caesar Bookman, NP

## 2023-07-09 NOTE — Progress Notes (Signed)
This encounter was created in error - please disregard. Patient and son unable to open up their video.will schedule in office appointment.

## 2023-07-11 ENCOUNTER — Encounter: Payer: Self-pay | Admitting: Adult Health

## 2023-07-11 ENCOUNTER — Ambulatory Visit (INDEPENDENT_AMBULATORY_CARE_PROVIDER_SITE_OTHER): Payer: Medicare Other | Admitting: Adult Health

## 2023-07-11 VITALS — BP 110/60 | HR 51 | Temp 96.0°F | Resp 16 | Ht 60.0 in

## 2023-07-11 DIAGNOSIS — I10 Essential (primary) hypertension: Secondary | ICD-10-CM | POA: Diagnosis not present

## 2023-07-11 DIAGNOSIS — Z8673 Personal history of transient ischemic attack (TIA), and cerebral infarction without residual deficits: Secondary | ICD-10-CM

## 2023-07-11 DIAGNOSIS — B379 Candidiasis, unspecified: Secondary | ICD-10-CM | POA: Diagnosis not present

## 2023-07-11 MED ORDER — NYSTATIN 100000 UNIT/GM EX OINT: 1.0000 | TOPICAL_OINTMENT | Freq: Two times a day (BID) | CUTANEOUS | 0 refills | Status: AC

## 2023-07-11 NOTE — Progress Notes (Unsigned)
[facility] clinic  Provider:   Code Status: *** Goals of Care:     07/11/2023   11:37 AM  Advanced Directives  Does Patient Have a Medical Advance Directive? Yes  Does patient want to make changes to medical advance directive? No - Patient declined     Chief Complaint  Patient presents with   Acute Visit    Patient is being seen for an acute rash on thigh    HPI: Patient is a 89 y.o. female seen today for an acute visit for  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   Edema extremities    GERD (gastroesophageal reflux disease)    Glaucoma    Macular degeneration    Per patient    Osteoarthritis    PVD (peripheral vascular disease) (HCC)    Seasonal allergies    Stroke [facility])     Past Surgical History:  Procedure Laterality Date   ABDOMINAL HYSTERECTOMY  1981   TONSILLECTOMY  1953    Allergies  Allergen Reactions   Aspirin Other (See Comments)    sweats   Codeine Other (See Comments)    Unknown reaction   Iodine Other (See Comments)    Pt is not aware of this allergy, does not recall much info.   Prednisone Other (See Comments)    sweats   Lyrica [Pregabalin]     Wobbly, "more than sleepy"   Benzonatate Itching and Rash   Sulfa Antibiotics Other (See Comments)    Doesn't remember Other reaction(s): Unknown    Outpatient Encounter Medications as of 07/11/2023  Medication Sig   acetaminophen (TYLENOL) 500 MG tablet Take 500 mg by mouth every 6 (six) hours as needed for mild pain or moderate pain.    alum hydroxide-mag trisilicate (GAVISCON) 80-20 MG CHEW chewable tablet Chew 2 tablets by mouth 2 (two) times daily.   amLODipine (NORVASC) 5 MG tablet TAKE 1 TABLET (5 MG TOTAL) BY MOUTH DAILY. PLEASE CALL [phone] TO SCHEDULE AN APPOINTMENT   atorvastatin (LIPITOR) 10 MG tablet TAKE 1 TABLET BY MOUTH EVERY DAY   Brinzolamide-Brimonidine 1-0.2 % SUSP Apply 1 drop to eye  3 (three) times daily. Both eyes   budesonide-formoterol (SYMBICORT) 80-4.5 MCG/ACT inhaler Inhale 2 puffs into the lungs 2 (two) times daily.   Calcium Citrate-Vitamin D (CALCIUM CITRATE + D3) 200-6.25 MG-MCG TABS Take 1 tablet by mouth daily.   clopidogrel (PLAVIX) 75 MG tablet TAKE 1 TABLET BY MOUTH EVERY DAY   Coenzyme Q10 (COQ10) 50 MG CAPS Take 50 mg by mouth daily.   docusate sodium (COLACE) 100 MG capsule Take 100 mg by mouth 2 (two) times daily as needed for mild constipation.   Emollient (CETAPHIL) cream Apply topically 2 (two) times daily. To legs   ferrous sulfate 325 (65 FE) MG EC tablet Take 325 mg by mouth daily.   furosemide (LASIX) 20 MG tablet Take 1 tablet (20 mg total) by mouth daily as needed for edema.   hydrocortisone (ANUSOL-HC) 2.5 % rectal cream Place 1 application rectally daily as needed for hemorrhoids or anal itching.   KLOR-CON M20 20 MEQ tablet TAKE 1 TABLET BY MOUTH EVERY DAY   Multiple Vitamins-Minerals (PRESERVISION AREDS 2) CAPS Take 1 capsule by mouth 2 (two) times daily.   Multiple Vitamins-Minerals (SENIOR MULTIVITAMIN PLUS) TABS Take 1 tablet by mouth daily.    NONFORMULARY OR COMPOUNDED ITEM [facility]:  Peripheral Neuropathy Cream - Bupivacaine 1%, Doxepin 3%, Gabapentin 6%, Pentoxifylline 3%, Topiramate 1%, apply 1-2 grams to affected areas 3-4 times daily prn.   risperiDONE (RISPERDAL) 0.5 MG tablet TAKE 1 TABLET BY MOUTH TWICE A DAY   ROCKLATAN 0.02-0.005 % SOLN Place 1 drop into the left eye at bedtime.   vitamin C (ASCORBIC ACID) 500 MG tablet Take 500 mg by mouth daily.   No facility-administered encounter medications on file as of 07/11/2023.    Review of Systems:  Review of Systems  Constitutional:  Negative for appetite change, chills, fatigue and fever.  HENT:  Negative for congestion, hearing loss, rhinorrhea and sore throat.   Eyes: Negative.   Respiratory:  Negative for cough, shortness of breath and wheezing.   Cardiovascular:   Negative for chest pain, palpitations and leg swelling.  Gastrointestinal:  Negative for abdominal pain, constipation, diarrhea, nausea and vomiting.  Genitourinary:  Negative for dysuria.  Musculoskeletal:  Negative for arthralgias, back pain and myalgias.  Skin:  Positive for rash. Negative for color change and wound.  Neurological:  Negative for dizziness, weakness and headaches.  Psychiatric/Behavioral:  Negative for behavioral problems. The patient is not nervous/anxious.      Health Maintenance  Topic Date Due   INFLUENZA VACCINE  05/08/2023   COVID-19 Vaccine (8 - 2023-24 season) 06/08/2023   Medicare Annual Wellness (AWV)  09/04/2023   Pneumonia Vaccine 46+ Years old  Completed   DEXA SCAN  Completed   Zoster Vaccines- Shingrix  Completed   HPV VACCINES  Aged Out   DTaP/Tdap/Td  Discontinued    Physical Exam: There were no vitals filed for this visit. There is no height or weight on file to calculate BMI. Physical Exam Constitutional:      General: She is not in acute distress.    Appearance: Normal appearance.  HENT:     Head: Normocephalic and atraumatic.     Nose: Nose normal.     Mouth/Throat:     Mouth: Mucous membranes are moist.  Eyes:     Comments: Blind on both eyes  Cardiovascular:     Rate and Rhythm: Normal rate and regular rhythm.  Pulmonary:     Effort: Pulmonary effort is normal.     Breath sounds: Normal breath sounds.  Abdominal:     General: Bowel sounds are normal.     Palpations: Abdomen is soft.  Musculoskeletal:     Cervical back: Normal range of motion.  Skin:    General: Skin is warm and dry.     Findings: Rash present.     Comments: Erythematous rashes on bilateral groin and inner thighs  Neurological:     General: No focal deficit present.     Mental Status: She is alert.     Motor: Weakness present.     Comments: Able to move BUE and not able to move BLE  Psychiatric:        Mood and Affect: Mood normal.        Behavior:  Behavior normal.     Labs reviewed: Basic Metabolic Panel: Recent Labs    09/11/22 2220 09/13/22 0413 11/27/22 1143 01/22/23 1120 05/01/23 1834  NA  --   --  140 141 138  K  --   --  4.0 3.8 4.1  CL  --   --  108 104 105  CO2  --   --  25 26 23   GLUCOSE  --   --  105*  79 103*  BUN  --   --  20 13 18   CREATININE  --   --  0.74 0.75 0.70  CALCIUM  --   --  8.4* 9.2 9.5  TSH 2.366 1.793 2.25  --   --    Liver Function Tests: Recent Labs    09/11/22 2204 11/27/22 1143 05/01/23 1834  AST 26 17 27   ALT 23 14 17   ALKPHOS 67  --  79  BILITOT 0.8 0.5 1.0  PROT 6.3* 5.8* 7.2  ALBUMIN 3.3*  --  3.5   No results for input(s): "LIPASE", "AMYLASE" in the last 8760 hours. Recent Labs    09/12/22 2232  AMMONIA <10   CBC: Recent Labs    11/27/22 1143 01/22/23 1120 05/01/23 1834  WBC 5.9 6.4 7.7  NEUTROABS 3,086 3,366 3.3  HGB 9.8* 11.9 11.6*  HCT 29.3* 35.2 34.0*  MCV 92.4 90.0 89.7  PLT 270 248 243   Lipid Panel: Recent Labs    09/12/22 0610 09/12/22 2232 11/27/22 1143  CHOL 88  --  110  HDL 48  --  50  LDLCALC NOT CALCULATED  --  47  TRIG <10  --  45  CHOLHDL 1.8  --  2.2  LDLDIRECT  --  28  --    Lab Results  Component Value Date   HGBA1C 5.3 04/14/2023    Procedures since last visit: No results found.  Assessment/Plan     Labs/tests ordered:    Next appt:  09/08/2023

## 2023-07-11 NOTE — Patient Instructions (Signed)
Skin Yeast Infection  A skin yeast infection is a condition in which there is an overgrowth of yeast (Candida) that normally lives on the skin. This condition usually occurs in areas of the skin that are constantly warm and moist, such as the skin under the breasts or armpits, or in the groin and other body folds. What are the causes? This condition is caused by a change in the normal balance of the yeast that live on the skin. What increases the risk? You are more likely to develop this condition if you: Are obese. Are pregnant. Are 44 years of age or older. Wear tight clothing. Have any of the following conditions: Diabetes. Malnutrition. A weak body defense system (immune system). Take medicines such as: Birth control pills. Antibiotics. Steroid medicines. What are the signs or symptoms? The most common symptom of this condition is itchiness in the affected area. Other symptoms include: A red, swollen area of the skin. Bumps on the skin. How is this diagnosed? This condition is diagnosed with a medical history and physical exam. Your health care provider may check for yeast by taking scrapings of the skin to be viewed under a microscope. How is this treated? This condition is treated with medicine. Medicines may be prescribed or available over the counter. The medicines may be: Taken by mouth (orally). Applied as a cream or powder to your skin. Follow these instructions at home:  Take or apply over-the-counter and prescription medicines only as told by your health care provider. Maintain a healthy weight. If you need help losing weight, talk with your health care provider. Keep your skin clean and dry. Wear loose-fitting clothing. If you have diabetes, keep your blood sugar under control. Keep all follow-up visits. This is important. Contact a health care provider if: Your symptoms go away and then come back. Your symptoms do not get better with treatment. Your symptoms get  worse. Your rash spreads. You have a fever or chills. You have new symptoms. You have new warmth or redness of your skin. Your rash is painful or bleeding. Summary A skin yeast infection is a condition in which there is an overgrowth of yeast (Candida) that normally lives on the skin. Take or apply over-the-counter and prescription medicines only as told by your health care provider. Keep your skin clean and dry. Contact a health care provider if your symptoms do not get better with treatment. This information is not intended to replace advice given to you by your health care provider. Make sure you discuss any questions you have with your health care provider. Document Revised: 12/12/2020 Document Reviewed: 12/12/2020 Elsevier Patient Education  2024 [employer].

## 2023-07-31 ENCOUNTER — Ambulatory Visit: Payer: Self-pay

## 2023-07-31 NOTE — Patient Outreach (Signed)
  Care Coordination   07/31/2023 Name: SHAJUAN VALIANT MRN: [id number] DOB: 10/29/1923   Care Coordination Outreach Attempts:  An unsuccessful telephone outreach was attempted for a scheduled appointment today.  Follow Up Plan:  Additional outreach attempts will be made to offer the patient care coordination information and services.   Encounter Outcome:  No Answer   Care Coordination Interventions:  No, not indicated    Delsa Sale RN BSN CCM [place]  [facility] Nurse Care Coordinator  Direct Dial: [phone] Website: [email or web address]

## 2023-08-31 ENCOUNTER — Other Ambulatory Visit: Payer: Self-pay | Admitting: Nurse Practitioner

## 2023-09-04 ENCOUNTER — Other Ambulatory Visit: Payer: Self-pay | Admitting: Family

## 2023-09-04 DIAGNOSIS — R600 Localized edema: Secondary | ICD-10-CM

## 2023-09-08 ENCOUNTER — Encounter: Payer: Medicare Other | Admitting: Family

## 2023-09-09 DIAGNOSIS — H401124 Primary open-angle glaucoma, left eye, indeterminate stage: Secondary | ICD-10-CM | POA: Diagnosis not present

## 2023-09-09 DIAGNOSIS — H401113 Primary open-angle glaucoma, right eye, severe stage: Secondary | ICD-10-CM | POA: Diagnosis not present

## 2023-09-12 ENCOUNTER — Encounter: Payer: Federal, State, Local not specified - PPO | Admitting: Family

## 2023-09-15 ENCOUNTER — Ambulatory Visit: Payer: Self-pay

## 2023-09-15 NOTE — Patient Instructions (Signed)
Visit Information  Thank you for taking time to visit with me today. Please don't hesitate to contact me if I can be of assistance to you.   Following are the goals we discussed today:   Goals Addressed             This Visit's Progress    COMPLETED: To continue to eat enough during meal times       Care Coordination Interventions: Completed successful outbound call with son Baldo Ash Evaluation of current treatment plan related to Nutritional support  and patient's adherence to plan as established by provider Determined patient continues to have a good appetite, she eats regularly and eats all meals prepared for her Discussed with son, patient lives with her son and has a private paid caregiver who assist with meal preparation  Assessed for SDOH barriers, none identified at this time  Instructed son to report new changes or concerns promptly  Discussed with son, patient will be discharged from care coordination due to remaining clinically stable and son voices having no health concerns or SDOH concerns at this time  Patient's son will contact patient's PCP to report new symptoms or concerns if they occur Patient's son will contact the care coordination nurse if services are needed in the future         If you are experiencing a Mental Health or Behavioral Health Crisis or need someone to talk to, please call 1-800-273-TALK (toll free, 24 hour hotline)  Patient verbalizes understanding of instructions and care plan provided today and agrees to view in MyChart. Active MyChart status and patient understanding of how to access instructions and care plan via MyChart confirmed with patient.     Delsa Sale RN BSN CCM [place]  [facility] Nurse Care Coordinator  Direct Dial: [phone] Website: [email or web address]

## 2023-09-15 NOTE — Patient Outreach (Signed)
  Care Coordination   Follow Up Visit Note   09/15/2023 Name: [name] [name] MRN: [id number] DOB: [birth year]  [name] [name] is a 89 y.o. year old female who sees Ngetich, Donalee Citrin, NP for primary care. I spoke with  [name] [name] by phone today.  What matters to the patients health and wellness today?  Patient will continue to manage her chronic health conditions and remain in a healthy state.     Goals Addressed             This Visit's Progress    COMPLETED: To continue to eat enough during meal times       Care Coordination Interventions: Completed successful outbound call with son [name] [name] Evaluation of current treatment plan related to Nutritional support  and patient's adherence to plan as established by provider Determined patient continues to have a good appetite, she eats regularly and eats all meals prepared for her Discussed with son, patient lives with her son and has a private paid caregiver who assist with meal preparation  Assessed for SDOH barriers, none identified at this time  Instructed son to report new changes or concerns promptly  Discussed with son, patient will be discharged from care coordination due to remaining clinically stable and son voices having no health concerns or SDOH concerns at this time  Patient's son will contact patient's PCP to report new symptoms or concerns if they occur Patient's son will contact the care coordination nurse if services are needed in the future     Interventions Today    Flowsheet Row Most Recent Value  Chronic Disease   Chronic disease during today's visit Other  [resolved sacral abrasion,  impaired physical mobility]  General Interventions   General Interventions Discussed/Reviewed General Interventions Discussed, General Interventions Reviewed, Doctor Visits  Doctor Visits Discussed/Reviewed Doctor Visits Discussed, Doctor Visits Reviewed, PCP, Specialist  Education Interventions   Education Provided Provided  Education  Provided Verbal Education On When to see the doctor, Nutrition, Medication  Mental Health Interventions   Mental Health Discussed/Reviewed Mental Health Discussed, Mental Health Reviewed  Nutrition Interventions   Nutrition Discussed/Reviewed Nutrition Discussed, Nutrition Reviewed  Pharmacy Interventions   Pharmacy Dicussed/Reviewed Pharmacy Topics Reviewed, Pharmacy Topics Discussed, Affording Medications, Medication Adherence  Safety Interventions   Safety Discussed/Reviewed Home Safety, Fall Risk, Safety Reviewed, Safety Discussed          SDOH assessments and interventions completed:  Yes  SDOH Interventions Today    Flowsheet Row Most Recent Value  SDOH Interventions   Food Insecurity Interventions Intervention Not Indicated  Housing Interventions Intervention Not Indicated  Transportation Interventions Intervention Not Indicated  Utilities Interventions Intervention Not Indicated  Physical Activity Interventions Other (Comments)  [Patient is unable to participate in an exercise regimen due to advanced age and having physical limitations]        Care Coordination Interventions:  Yes, provided   Follow up plan: No further intervention required.   Encounter Outcome:  Patient Visit Completed

## 2023-09-22 ENCOUNTER — Emergency Department (HOSPITAL_COMMUNITY)
Admission: EM | Admit: 2023-09-22 | Discharge: 2023-09-22 | Disposition: A | Discharge status: Home / Self Care | Payer: Medicare Other | Attending: Emergency Medicine | Admitting: Emergency Medicine

## 2023-09-22 ENCOUNTER — Encounter (HOSPITAL_COMMUNITY): Payer: Self-pay

## 2023-09-22 ENCOUNTER — Emergency Department (HOSPITAL_COMMUNITY): Payer: Medicare Other

## 2023-09-22 ENCOUNTER — Ambulatory Visit: Payer: Medicare Other | Admitting: Adult Health

## 2023-09-22 ENCOUNTER — Other Ambulatory Visit: Payer: Self-pay

## 2023-09-22 DIAGNOSIS — F29 Unspecified psychosis not due to a substance or known physiological condition: Secondary | ICD-10-CM | POA: Diagnosis not present

## 2023-09-22 DIAGNOSIS — R0602 Shortness of breath: Secondary | ICD-10-CM | POA: Insufficient documentation

## 2023-09-22 DIAGNOSIS — I517 Cardiomegaly: Secondary | ICD-10-CM | POA: Insufficient documentation

## 2023-09-22 DIAGNOSIS — Z7401 Bed confinement status: Secondary | ICD-10-CM | POA: Diagnosis not present

## 2023-09-22 DIAGNOSIS — R059 Cough, unspecified: Secondary | ICD-10-CM

## 2023-09-22 DIAGNOSIS — I10 Essential (primary) hypertension: Secondary | ICD-10-CM | POA: Diagnosis not present

## 2023-09-22 DIAGNOSIS — J189 Pneumonia, unspecified organism: Secondary | ICD-10-CM | POA: Diagnosis not present

## 2023-09-22 DIAGNOSIS — Z1152 Encounter for screening for COVID-19: Secondary | ICD-10-CM | POA: Insufficient documentation

## 2023-09-22 DIAGNOSIS — Z7902 Long term (current) use of antithrombotics/antiplatelets: Secondary | ICD-10-CM | POA: Diagnosis not present

## 2023-09-22 DIAGNOSIS — R5383 Other fatigue: Secondary | ICD-10-CM | POA: Diagnosis not present

## 2023-09-22 DIAGNOSIS — H547 Unspecified visual loss: Secondary | ICD-10-CM | POA: Insufficient documentation

## 2023-09-22 DIAGNOSIS — R531 Weakness: Secondary | ICD-10-CM | POA: Diagnosis not present

## 2023-09-22 DIAGNOSIS — I4891 Unspecified atrial fibrillation: Secondary | ICD-10-CM | POA: Diagnosis not present

## 2023-09-22 LAB — CBC WITH DIFFERENTIAL/PLATELET
Abs Immature Granulocytes: 0.03 10*3/uL (ref 0.00–0.07)
Basophils Absolute: 0.1 10*3/uL (ref 0.0–0.1)
Basophils Relative: 1 %
Eosinophils Absolute: 0.2 10*3/uL (ref 0.0–0.5)
Eosinophils Relative: 2 %
HCT: 34.4 % — ABNORMAL LOW (ref 36.0–46.0)
Hemoglobin: 11.4 g/dL — ABNORMAL LOW (ref 12.0–15.0)
Immature Granulocytes: 0 %
Lymphocytes Relative: 32 %
Lymphs Abs: 3 10*3/uL (ref 0.7–4.0)
MCH: 30.6 pg (ref 26.0–34.0)
MCHC: 33.1 g/dL (ref 30.0–36.0)
MCV: 92.2 fL (ref 80.0–100.0)
Monocytes Absolute: 1.4 10*3/uL — ABNORMAL HIGH (ref 0.1–1.0)
Monocytes Relative: 15 %
Neutro Abs: 4.6 10*3/uL (ref 1.7–7.7)
Neutrophils Relative %: 50 %
Platelets: 271 10*3/uL (ref 150–400)
RBC: 3.73 MIL/uL — ABNORMAL LOW (ref 3.87–5.11)
RDW: 14.7 % (ref 11.5–15.5)
WBC: 9.3 10*3/uL (ref 4.0–10.5)
nRBC: 0 % (ref 0.0–0.2)

## 2023-09-22 LAB — RESP PANEL BY RT-PCR (RSV, FLU A&B, COVID)  RVPGX2
Influenza A by PCR: NEGATIVE
Influenza B by PCR: NEGATIVE
Resp Syncytial Virus by PCR: NEGATIVE
SARS Coronavirus 2 by RT PCR: NEGATIVE

## 2023-09-22 LAB — BASIC METABOLIC PANEL
Anion gap: 10 (ref 5–15)
BUN: 11 mg/dL (ref 8–23)
CO2: 24 mmol/L (ref 22–32)
Calcium: 9.4 mg/dL (ref 8.9–10.3)
Chloride: 107 mmol/L (ref 98–111)
Creatinine, Ser: 0.66 mg/dL (ref 0.44–1.00)
GFR, Estimated: >60 mL/min (ref >60)
Glucose, Bld: 134 mg/dL — ABNORMAL HIGH (ref 70–99)
Potassium: 3.7 mmol/L (ref 3.5–5.1)
Sodium: 141 mmol/L (ref 135–145)

## 2023-09-22 LAB — BRAIN NATRIURETIC PEPTIDE: B Natriuretic Peptide: 160.9 pg/mL — ABNORMAL HIGH (ref 0.0–100.0)

## 2023-09-22 MED ORDER — DOXYCYCLINE HYCLATE 100 MG PO TABS
100.0000 mg | ORAL_TABLET | Freq: Once | ORAL | Status: AC
  Administered 2023-09-22: 100 mg via ORAL
  Filled 2023-09-22: qty 1

## 2023-09-22 MED ORDER — DOXYCYCLINE HYCLATE 100 MG PO CAPS: 100.0000 mg | ORAL_CAPSULE | Freq: Two times a day (BID) | ORAL | 0 refills | Status: AC

## 2023-09-22 NOTE — Discharge Instructions (Addendum)
Take antibiotics as directed.  Stay well-hydrated and follow-up with your doctor for recheck later this week.  Return for breathing difficulty or new concerns.

## 2023-09-22 NOTE — ED Notes (Signed)
[facility] called and set up transport at this time

## 2023-09-22 NOTE — ED Notes (Signed)
Pt given water at this time, pt offered food, food was declined

## 2023-09-22 NOTE — ED Notes (Signed)
Spoke to son, ems will have to transport

## 2023-09-22 NOTE — ED Provider Notes (Signed)
Patient care signed out to follow-up results for final disposition.  On examination patient has normal work of breathing, few sparse rales, answering questions without breathing difficulty.  Mild elevated blood pressure.  Discussed with patient if she would be comfortable going home concern at this time for atypical pneumonia/viral pneumonia with cough.  No overt pulmonary edema on exam or chest x-ray.  Cardiomegaly overall similar to previous reviewed independently.  Patient not requiring oxygen in the ED.  Blood work independently reviewed reassuring.  Discussed with patient's son who identified patient name and date of birth and says they do have support at home to take care of her to try to keep her and comfortable with oral antibiotics and recheck with primary doctor.  Reasons to return discussed in detail.  Patient send call comfortable this plan.  Nursing discussed with Lars Mage for safe ride home.  Cough in adult  Atypical pneumonia    Blane Ohara, MD 09/22/23 [phone]

## 2023-09-22 NOTE — ED Provider Notes (Signed)
[place] EMERGENCY DEPARTMENT AT [facility] Provider Note   CSN: [id number] Arrival date & time: 09/22/23  1401     History  Chief Complaint  Patient presents with   Cough    [name] [name] is a 89 y.o. female presented to emergency room complaining of a cough for about 4 days.  Reports congestion as well.  Patient has a history of blindness, A-fib, pneumonia.  She presents by EMS.  She is 97% on room air  HPI     Home Medications Prior to Admission medications   Medication Sig Start Date End Date Taking? Authorizing Provider  acetaminophen (TYLENOL) 500 MG tablet Take 500 mg by mouth every 6 (six) hours as needed for mild pain or moderate pain.     [provider]  alum hydroxide-mag trisilicate (GAVISCON) 80-20 MG CHEW chewable tablet Chew 2 tablets by mouth 2 (two) times daily.    [provider]  amLODipine (NORVASC) 5 MG tablet TAKE 1 TABLET (5 MG TOTAL) BY MOUTH DAILY. PLEASE CALL [phone] TO SCHEDULE AN APPOINTMENT 04/14/23   Ngetich, Dinah C, NP  atorvastatin (LIPITOR) 10 MG tablet TAKE 1 TABLET BY MOUTH EVERY DAY 05/05/23   Sharon Seller, NP  Brinzolamide-Brimonidine 1-0.2 % SUSP Apply 1 drop to eye 3 (three) times daily. Both eyes    [provider]  budesonide-formoterol (SYMBICORT) 80-4.5 MCG/ACT inhaler Inhale 2 puffs into the lungs 2 (two) times daily.    [provider]  Calcium Citrate-Vitamin D (CALCIUM CITRATE + D3) 200-6.25 MG-MCG TABS Take 1 tablet by mouth daily.    [provider]  clopidogrel (PLAVIX) 75 MG tablet TAKE 1 TABLET BY MOUTH EVERY DAY 02/17/23   Ngetich, Dinah C, NP  Coenzyme Q10 (COQ10) 50 MG CAPS Take 50 mg by mouth daily.    [provider]  docusate sodium (COLACE) 100 MG capsule Take 100 mg by mouth 2 (two) times daily as needed for mild constipation.    [provider]  Emollient (CETAPHIL) cream Apply topically 2 (two) times daily. To legs 02/05/22   Ngetich,  Dinah C, NP  ferrous sulfate 325 (65 FE) MG EC tablet Take 325 mg by mouth daily.    [provider]  furosemide (LASIX) 20 MG tablet Take 1 tablet (20 mg total) by mouth daily as needed for edema. 04/14/23   Ngetich, Dinah C, NP  hydrocortisone (ANUSOL-HC) 2.5 % rectal cream Place 1 application rectally daily as needed for hemorrhoids or anal itching. 02/03/20   Reed, Tiffany L, DO  KLOR-CON M20 20 MEQ tablet TAKE 1 TABLET BY MOUTH EVERY DAY 09/08/23   Ngetich, Dinah C, NP  Multiple Vitamins-Minerals (PRESERVISION AREDS 2) CAPS Take 1 capsule by mouth 2 (two) times daily.    [provider]  Multiple Vitamins-Minerals (SENIOR MULTIVITAMIN PLUS) TABS Take 1 tablet by mouth daily.     [provider]  NONFORMULARY OR COMPOUNDED ITEM [facility]:  Peripheral Neuropathy Cream - Bupivacaine 1%, Doxepin 3%, Gabapentin 6%, Pentoxifylline 3%, Topiramate 1%, apply 1-2 grams to affected areas 3-4 times daily prn. 09/11/22   Ngetich, Dinah C, NP  risperiDONE (RISPERDAL) 0.5 MG tablet TAKE 1 TABLET BY MOUTH TWICE A DAY 09/01/23   Ngetich, Dinah C, NP  ROCKLATAN 0.02-0.005 % SOLN Place 1 drop into the left eye at bedtime. 04/23/21   [provider]  vitamin C (ASCORBIC ACID) 500 MG tablet Take 500 mg by mouth daily.    [provider]  Allergies    Aspirin, Codeine, Iodine, Prednisone, Lyrica [pregabalin], Benzonatate, and Sulfa antibiotics    Review of Systems   Review of Systems  Physical Exam Updated Vital Signs BP 136/87   Pulse 87   Temp 98.1 F (36.7 C) (Oral)   Resp 20   SpO2 (!) 88%  Physical Exam  ED Results / Procedures / Treatments   Labs (all labs ordered are listed, but only abnormal results are displayed) Labs Reviewed - No data to display  EKG None  Radiology No results found.  Procedures Procedures    Medications Ordered in ED Medications - No data to display  ED Course/ Medical Decision Making/ A&P Clinical Course  as of 09/22/23 1853  Mon Sep 22, 2023  1610 Signed out to Dr Jodi Mourning [MT]    Clinical Course User Index [MT] Renaye Rakers Kermit Balo, MD                                 Medical Decision Making Amount and/or Complexity of Data Reviewed Labs: ordered. Radiology: ordered.  Risk Prescription drug management.   Patient here with complaint of cough for several days  Stable on arrival on room air  Supplemental hx provided by EMS  Ddx includes bacterial PNA vs viral URI vs pulm edema vs other  Patient is pending labs and xray at the time of signout to Dr Jodi Mourning EDP at 410 pm.        Final Clinical Impression(s) / ED Diagnoses Final diagnoses:  None    Rx / DC Orders ED Discharge Orders     None         Artha Chiasson, Kermit Balo, MD 09/22/23 [phone]

## 2023-09-22 NOTE — ED Triage Notes (Signed)
Pt arrives via ems to the er for the c/o cough, congestion x week. Increased weakness, and appetite as well. Pt is from home, wheelchair bound, fully blind, hx afib, htn, pneumonia.   VS PER EMS 140/86 80p 16r 97% RA

## 2023-09-23 ENCOUNTER — Ambulatory Visit: Payer: Medicare Other | Admitting: Sports Medicine

## 2023-09-24 ENCOUNTER — Ambulatory Visit: Payer: Medicare Other | Admitting: Podiatry

## 2023-09-26 ENCOUNTER — Ambulatory Visit (INDEPENDENT_AMBULATORY_CARE_PROVIDER_SITE_OTHER): Payer: Medicare Other | Admitting: Nurse Practitioner

## 2023-09-26 ENCOUNTER — Encounter: Payer: Self-pay | Admitting: Nurse Practitioner

## 2023-09-26 VITALS — BP 144/92 | HR 62 | Temp 97.6°F | Resp 17

## 2023-09-26 DIAGNOSIS — E785 Hyperlipidemia, unspecified: Secondary | ICD-10-CM

## 2023-09-26 DIAGNOSIS — R600 Localized edema: Secondary | ICD-10-CM

## 2023-09-26 DIAGNOSIS — J4520 Mild intermittent asthma, uncomplicated: Secondary | ICD-10-CM

## 2023-09-26 DIAGNOSIS — J189 Pneumonia, unspecified organism: Secondary | ICD-10-CM | POA: Diagnosis not present

## 2023-09-26 DIAGNOSIS — F22 Delusional disorders: Secondary | ICD-10-CM | POA: Diagnosis not present

## 2023-09-26 DIAGNOSIS — I10 Essential (primary) hypertension: Secondary | ICD-10-CM | POA: Diagnosis not present

## 2023-09-26 DIAGNOSIS — I6340 Cerebral infarction due to embolism of unspecified cerebral artery: Secondary | ICD-10-CM

## 2023-09-26 DIAGNOSIS — D649 Anemia, unspecified: Secondary | ICD-10-CM

## 2023-09-26 MED ORDER — PREDNISONE 20 MG PO TABS: 20.0000 mg | ORAL_TABLET | Freq: Every day | ORAL | 0 refills | Status: DC

## 2023-09-26 MED ORDER — BENZONATATE 100 MG PO CAPS
100.0000 mg | ORAL_CAPSULE | Freq: Three times a day (TID) | ORAL | 0 refills | Status: DC | PRN
Start: 2023-09-26 — End: 2024-01-09

## 2023-09-26 NOTE — Assessment & Plan Note (Signed)
Hx of CVA, taking Plavix, Atorvastatin

## 2023-09-26 NOTE — Assessment & Plan Note (Signed)
taking Atorvastatin, LDL 47 11/27/22

## 2023-09-26 NOTE — Assessment & Plan Note (Signed)
Major neurocognitive disorder/Delusion: taking Risperdal

## 2023-09-26 NOTE — Assessment & Plan Note (Signed)
Prn Furosemide is available to her

## 2023-09-26 NOTE — Assessment & Plan Note (Signed)
Blood pressure is controlled, on Amlodipine, Bun/creat 11/0.66, BNP 160.9 09/22/23

## 2023-09-26 NOTE — Assessment & Plan Note (Signed)
Hx of asthma, chronic Symbicort.

## 2023-09-26 NOTE — Assessment & Plan Note (Signed)
on Fe, Hgb 11.4 09/22/23

## 2023-09-26 NOTE — Progress Notes (Signed)
Location:   [facility]inic [facility]   Place of Service:    Provider: Chipper Oman NP  Code Status: DNR Goals of Care:     09/26/2023   10:24 AM  Advanced Directives  Does Patient Have a Medical Advance Directive? Yes  Type of Advance Directive Living will;Healthcare Power of Attorney  Does patient want to make changes to medical advance directive? No - Patient declined  Copy of Healthcare Power of Attorney in Chart? Yes - validated most recent copy scanned in chart (See row information)     Chief Complaint  Patient presents with   Transitions Of Care    ER follow up for breathing trouble and coughing.    HPI: Patient is a 89 y.o. female seen today for medical management of chronic diseases.      ED eval 09/22/23 for atypical PNA, presented with cough, congestion, placed on Doxycycline, wbc 9.3, CXR showed no edema or consolidation 09/22/23  Hx of asthma, chronic Symbicort.   HTN, on Amlodipine, Bun/creat 11/0.66, BNP 160.9 09/22/23  Hx of CVA, taking Plavix, Atorvastatin, not walking.   HLD taking Atorvastatin, LDL 47 11/27/22  Anemia, on Fe, Hgb 11.4 09/22/23  Major neurocognitive disorder/Delusion: taking Risperdal   Edema prn Furosemide  Blindness    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   Edema extremities    GERD (gastroesophageal reflux disease)    Glaucoma    Macular degeneration    Per patient    Osteoarthritis    PVD (peripheral vascular disease) (HCC)    Seasonal allergies    Stroke [facility])     Past Surgical History:  Procedure Laterality Date   ABDOMINAL HYSTERECTOMY  1981   TONSILLECTOMY  1953    Allergies  Allergen Reactions   Aspirin Other (See Comments)    sweats   Codeine Other (See Comments)    Unknown reaction   Iodine Other (See Comments)    Pt is not aware of this allergy, does not recall much info.   Prednisone Other (See Comments)    sweats   Lyrica  [Pregabalin]     Wobbly, "more than sleepy"   Benzonatate Itching and Rash   Sulfa Antibiotics Other (See Comments)    Doesn't remember Other reaction(s): Unknown    Allergies as of 09/26/2023       Reactions   Aspirin Other (See Comments)   sweats   Codeine Other (See Comments)   Unknown reaction   Iodine Other (See Comments)   Pt is not aware of this allergy, does not recall much info.   Prednisone Other (See Comments)   sweats   Lyrica [pregabalin]    Wobbly, "more than sleepy"   Benzonatate Itching, Rash   Sulfa Antibiotics Other (See Comments)   Doesn't remember Other reaction(s): Unknown        Medication List        Accurate as of September 26, 2023  4:16 PM. If you have any questions, ask your nurse or doctor.          acetaminophen 500 MG tablet Commonly known as: TYLENOL Take 500 mg by mouth every 6 (six) hours as needed for mild pain or moderate pain.   alum hydroxide-mag trisilicate 80-20 MG Chew chewable tablet Commonly known as: GAVISCON Chew 2 tablets by mouth 2 (two) times daily.   amLODipine 5 MG tablet Commonly known as:  NORVASC TAKE 1 TABLET (5 MG TOTAL) BY MOUTH DAILY. PLEASE CALL [phone] TO SCHEDULE AN APPOINTMENT   ascorbic acid 500 MG tablet Commonly known as: VITAMIN C Take 500 mg by mouth daily.   atorvastatin 10 MG tablet Commonly known as: LIPITOR TAKE 1 TABLET BY MOUTH EVERY DAY   benzonatate 100 MG capsule Commonly known as: Tessalon Perles Take 1 capsule (100 mg total) by mouth 3 (three) times daily as needed for cough. Started by: Shaheen Star X Dailynn Nancarrow   Brinzolamide-Brimonidine 1-0.2 % Susp Apply 1 drop to eye 3 (three) times daily. Both eyes   budesonide-formoterol 80-4.5 MCG/ACT inhaler Commonly known as: SYMBICORT Inhale 2 puffs into the lungs 2 (two) times daily.   Calcium Citrate + D3 200-6.25 MG-MCG Tabs Generic drug: Calcium Citrate-Vitamin D Take 1 tablet by mouth daily.   cetaphil cream Apply topically 2  (two) times daily. To legs   clopidogrel 75 MG tablet Commonly known as: PLAVIX TAKE 1 TABLET BY MOUTH EVERY DAY   CoQ10 50 MG Caps Take 50 mg by mouth daily.   docusate sodium 100 MG capsule Commonly known as: COLACE Take 100 mg by mouth 2 (two) times daily as needed for mild constipation.   doxycycline 100 MG capsule Commonly known as: VIBRAMYCIN Take 1 capsule (100 mg total) by mouth 2 (two) times daily for 7 days.   ferrous sulfate 325 (65 FE) MG EC tablet Take 325 mg by mouth daily.   furosemide 20 MG tablet Commonly known as: LASIX Take 1 tablet (20 mg total) by mouth daily as needed for edema.   hydrocortisone 2.5 % rectal cream Commonly known as: Anusol-HC Place 1 application rectally daily as needed for hemorrhoids or anal itching.   Klor-Con M20 20 MEQ tablet Generic drug: potassium chloride SA TAKE 1 TABLET BY MOUTH EVERY DAY   NONFORMULARY OR COMPOUNDED ITEM [facility]:  Peripheral Neuropathy Cream - Bupivacaine 1%, Doxepin 3%, Gabapentin 6%, Pentoxifylline 3%, Topiramate 1%, apply 1-2 grams to affected areas 3-4 times daily prn.   predniSONE 20 MG tablet Commonly known as: DELTASONE Take 1 tablet (20 mg total) by mouth daily with breakfast. Started by: Jozef Eisenbeis X Ashima Shrake   risperiDONE 0.5 MG tablet Commonly known as: RISPERDAL TAKE 1 TABLET BY MOUTH TWICE A DAY   Rocklatan 0.02-0.005 % Soln Generic drug: Netarsudil-Latanoprost Place 1 drop into the left eye at bedtime.   PreserVision AREDS 2 Caps Take 1 capsule by mouth 2 (two) times daily.   Senior Multivitamin Plus Tabs Take 1 tablet by mouth daily.        Review of Systems:  Review of Systems  Constitutional:  Negative for appetite change, fatigue and fever.  HENT:  Positive for hearing loss. Negative for trouble swallowing.   Respiratory:  Positive for cough. Negative for shortness of breath and wheezing.   Cardiovascular:  Positive for leg swelling.  Gastrointestinal:  Negative for  abdominal pain, constipation, nausea and vomiting.  Genitourinary:  Negative for dysuria and urgency.  Musculoskeletal:  Positive for gait problem.  Skin:  Negative for color change.  Neurological:  Positive for weakness. Negative for speech difficulty and headaches.       Legs weakness.   Psychiatric/Behavioral:  Negative for confusion and sleep disturbance.     Health Maintenance  Topic Date Due   COVID-19 Vaccine (9 - 2024-25 season) 08/21/2023   Medicare Annual Wellness (AWV)  09/04/2023   Pneumonia Vaccine 75+ Years old  Completed   INFLUENZA VACCINE  Completed  DEXA SCAN  Completed   Zoster Vaccines- Shingrix  Completed   HPV VACCINES  Aged Out   DTaP/Tdap/Td  Discontinued    Physical Exam: Vitals:   09/26/23 1030 09/26/23 1036  BP: (!) 140/90 (!) 144/92  Pulse: 62   Resp: 17   Temp: 97.6 F (36.4 C)   SpO2: 95%    There is no height or weight on file to calculate BMI. Physical Exam Vitals and nursing note reviewed.  Constitutional:      Appearance: Normal appearance.  HENT:     Head: Normocephalic and atraumatic.     Nose: Nose normal.     Mouth/Throat:     Mouth: Mucous membranes are moist.  Eyes:     Comments: Blind R+L eyes  Cardiovascular:     Rate and Rhythm: Normal rate and regular rhythm.     Heart sounds: No murmur heard. Pulmonary:     Effort: Pulmonary effort is normal.     Breath sounds: Rales present. No wheezing or rhonchi.     Comments: Diffused rales Abdominal:     General: Bowel sounds are normal.     Palpations: Abdomen is soft.     Tenderness: There is no abdominal tenderness.  Musculoskeletal:     Cervical back: Normal range of motion and neck supple.     Right lower leg: Edema present.     Left lower leg: Edema present.     Comments: Trace edema BLE  Skin:    General: Skin is warm and dry.  Neurological:     General: No focal deficit present.     Mental Status: She is alert and oriented to person, place, and time. Mental  status is at baseline.     Motor: Weakness present.     Gait: Gait abnormal.  Psychiatric:        Mood and Affect: Mood normal.        Behavior: Behavior normal.        Thought Content: Thought content normal.     Labs reviewed: Basic Metabolic Panel: Recent Labs    11/27/22 1143 01/22/23 1120 05/01/23 1834 09/22/23 1432  NA 140 141 138 141  K 4.0 3.8 4.1 3.7  CL 108 104 105 107  CO2 25 26 23 24   GLUCOSE 105* 79 103* 134*  BUN 20 13 18 11   CREATININE 0.74 0.75 0.70 0.66  CALCIUM 8.4* 9.2 9.5 9.4  TSH 2.25  --   --   --    Liver Function Tests: Recent Labs    11/27/22 1143 05/01/23 1834  AST 17 27  ALT 14 17  ALKPHOS  --  79  BILITOT 0.5 1.0  PROT 5.8* 7.2  ALBUMIN  --  3.5   No results for input(s): "LIPASE", "AMYLASE" in the last 8760 hours. No results for input(s): "AMMONIA" in the last 8760 hours. CBC: Recent Labs    01/22/23 1120 05/01/23 1834 09/22/23 1432  WBC 6.4 7.7 9.3  NEUTROABS 3,366 3.3 4.6  HGB 11.9 11.6* 11.4*  HCT 35.2 34.0* 34.4*  MCV 90.0 89.7 92.2  PLT 248 243 271   Lipid Panel: Recent Labs    11/27/22 1143  CHOL 110  HDL 50  LDLCALC 47  TRIG 45  CHOLHDL 2.2   Lab Results  Component Value Date   HGBA1C 5.3 04/14/2023    Procedures since last visit: DG Chest 2 View Result Date: 09/22/2023 CLINICAL DATA:  Shortness of breath EXAM: CHEST - 2 VIEW COMPARISON:  X-ray 02/06/2023 FINDINGS: Enlarged cardiopericardial silhouette with slight prominent central vasculature. No pneumothorax, effusion or consolidation. No edema. Overlapping cardiac leads. Curvature of the spine with some degenerative changes. IMPRESSION: Enlarged cardiac silhouette.  No edema or consolidation. Electronically Signed   By: Karen Kays M.D.   On: 09/22/2023 16:24    Assessment/Plan  Atypical pneumonia ED eval 09/22/23 for atypical PNA, presented with cough, congestion, placed on Doxycycline, wbc 9.3, CXR showed no edema or consolidation  09/22/23 Persisted cough, occasion clear phlegm, denied chest pain or SOB Will add prednisone 20mg  every day x 5 days, Tessalon 100mg  tid x 3 days.   Essential hypertension Blood pressure is controlled, on Amlodipine, Bun/creat 11/0.66, BNP 160.9 09/22/23  Cerebral infarction due to embolism of cerebral artery (HCC) Hx of CVA, taking Plavix, Atorvastatin  Hyperlipidemia  taking Atorvastatin, LDL 47 11/27/22  Chronic anemia on Fe, Hgb 11.4 09/22/23  Delusion (HCC) Major neurocognitive disorder/Delusion: taking Risperdal   Edema of extremities Prn Furosemide is available to her  Asthma, mild intermittent Hx of asthma, chronic Symbicort.    Labs/tests ordered:  none  Next appt:  12/04/2023

## 2023-09-26 NOTE — Assessment & Plan Note (Addendum)
ED eval 09/22/23 for atypical PNA, presented with cough, congestion, placed on Doxycycline, wbc 9.3, CXR showed no edema or consolidation 09/22/23 Persisted cough, occasion clear phlegm, denied chest pain or SOB Will add prednisone 20mg  every day x 5 days, Tessalon 100mg  tid x 3 days.

## 2023-11-04 ENCOUNTER — Ambulatory Visit: Payer: Medicare Other | Admitting: Podiatry

## 2023-11-14 ENCOUNTER — Other Ambulatory Visit: Payer: Self-pay | Admitting: Nurse Practitioner

## 2023-11-14 DIAGNOSIS — E785 Hyperlipidemia, unspecified: Secondary | ICD-10-CM

## 2023-11-21 ENCOUNTER — Telehealth: Payer: Self-pay

## 2023-11-21 NOTE — Telephone Encounter (Signed)
Patient is calling to change the preferred pharmacy

## 2023-12-04 ENCOUNTER — Encounter: Payer: Federal, State, Local not specified - PPO | Admitting: Family

## 2023-12-05 ENCOUNTER — Other Ambulatory Visit: Payer: Self-pay | Admitting: Family

## 2023-12-05 DIAGNOSIS — R600 Localized edema: Secondary | ICD-10-CM

## 2024-01-01 ENCOUNTER — Other Ambulatory Visit: Payer: Self-pay | Admitting: Family

## 2024-01-01 DIAGNOSIS — I10 Essential (primary) hypertension: Secondary | ICD-10-CM

## 2024-01-09 ENCOUNTER — Encounter: Payer: Self-pay | Admitting: Family

## 2024-01-09 ENCOUNTER — Telehealth (INDEPENDENT_AMBULATORY_CARE_PROVIDER_SITE_OTHER): Admitting: Family

## 2024-01-09 DIAGNOSIS — E785 Hyperlipidemia, unspecified: Secondary | ICD-10-CM

## 2024-01-09 DIAGNOSIS — F0390 Unspecified dementia without behavioral disturbance: Secondary | ICD-10-CM

## 2024-01-09 DIAGNOSIS — F22 Delusional disorders: Secondary | ICD-10-CM | POA: Diagnosis not present

## 2024-01-09 DIAGNOSIS — D649 Anemia, unspecified: Secondary | ICD-10-CM

## 2024-01-09 DIAGNOSIS — I10 Essential (primary) hypertension: Secondary | ICD-10-CM | POA: Diagnosis not present

## 2024-01-09 DIAGNOSIS — Z8673 Personal history of transient ischemic attack (TIA), and cerebral infarction without residual deficits: Secondary | ICD-10-CM | POA: Diagnosis not present

## 2024-01-09 DIAGNOSIS — R7303 Prediabetes: Secondary | ICD-10-CM | POA: Diagnosis not present

## 2024-01-09 DIAGNOSIS — R600 Localized edema: Secondary | ICD-10-CM

## 2024-01-09 DIAGNOSIS — K644 Residual hemorrhoidal skin tags: Secondary | ICD-10-CM

## 2024-01-09 DIAGNOSIS — J4520 Mild intermittent asthma, uncomplicated: Secondary | ICD-10-CM | POA: Diagnosis not present

## 2024-01-09 DIAGNOSIS — K648 Other hemorrhoids: Secondary | ICD-10-CM | POA: Diagnosis not present

## 2024-01-09 MED ORDER — PRESERVISION AREDS 2 PO CAPS: 1.0000 | ORAL_CAPSULE | Freq: Two times a day (BID) | ORAL | 1 refills | Status: AC

## 2024-01-09 MED ORDER — CALCIUM CITRATE + D3 200-6.25 MG-MCG PO TABS: 1.0000 | ORAL_TABLET | Freq: Every day | ORAL | 1 refills | Status: AC

## 2024-01-09 MED ORDER — FUROSEMIDE 20 MG PO TABS
20.0000 mg | ORAL_TABLET | Freq: Every day | ORAL | 1 refills | Status: DC | PRN
Start: 2024-01-09 — End: 2024-08-16

## 2024-01-09 MED ORDER — ATORVASTATIN CALCIUM 10 MG PO TABS
10.0000 mg | ORAL_TABLET | Freq: Every day | ORAL | 1 refills | Status: DC
Start: 2024-01-09 — End: 2024-08-10

## 2024-01-09 MED ORDER — CLOPIDOGREL BISULFATE 75 MG PO TABS: 75.0000 mg | ORAL_TABLET | Freq: Every day | ORAL | 3 refills | Status: AC

## 2024-01-09 MED ORDER — RISPERIDONE 0.5 MG PO TABS: 0.5000 mg | ORAL_TABLET | Freq: Two times a day (BID) | ORAL | 3 refills | Status: DC

## 2024-01-09 MED ORDER — ROCKLATAN 0.02-0.005 % OP SOLN: 1.0000 [drp] | Freq: Every day | OPHTHALMIC | 3 refills | Status: AC

## 2024-01-09 MED ORDER — FERROUS SULFATE 325 (65 FE) MG PO TBEC: 325.0000 mg | DELAYED_RELEASE_TABLET | Freq: Every day | ORAL | 1 refills | Status: AC

## 2024-01-09 MED ORDER — AMLODIPINE BESYLATE 5 MG PO TABS: ORAL_TABLET | ORAL | 1 refills | Status: DC

## 2024-01-09 MED ORDER — HYDROCORTISONE (PERIANAL) 2.5 % EX CREA
1.0000 | TOPICAL_CREAM | Freq: Every day | CUTANEOUS | 5 refills | Status: AC | PRN
Start: 2024-01-09 — End: ?

## 2024-01-09 MED ORDER — BRINZOLAMIDE-BRIMONIDINE 1-0.2 % OP SUSP: 1.0000 [drp] | Freq: Three times a day (TID) | OPHTHALMIC | 3 refills | Status: DC

## 2024-01-09 MED ORDER — BUDESONIDE-FORMOTEROL FUMARATE 80-4.5 MCG/ACT IN AERO: 2.0000 | INHALATION_SPRAY | Freq: Two times a day (BID) | RESPIRATORY_TRACT | 3 refills | Status: DC

## 2024-01-09 NOTE — Progress Notes (Signed)
 This service is provided via telemedicine  No vital signs collected/recorded due to the encounter was a telemedicine visit.   Location of patient (ex: home, work):  Home  Patient consents to a telephone visit: Yes  Location of the provider (ex: office, home):  [facility], Office   Name of any referring provider:  N/A  Names of all persons participating in the telemedicine service and their role in the encounter:  Ronald Pippins, CMA, Patient, and Son Baldo Ash and Sugar Vanzandt, Donalee Citrin, NP   Time spent on call:  9 min with medical assistant    Location:      Place of Service:    Provider: Tully Mcinturff FNP-C   Zhoey Blackstock, Donalee Citrin, NP  Patient Care Team: Alie Hardgrove, Donalee Citrin, NP as PCP - General (Family Medicine) Quintella Reichert, MD as PCP - Cardiology (Cardiology) Vivi Barrack, DPM as Consulting Physician (Podiatry) Bond, Doran Stabler, MD as Referring Physician (Ophthalmology)  Extended Emergency Contact Information Primary Emergency Contact: Boomhower,Carl          [place] [address] [place] Home Phone: [phone] Mobile Phone: [phone] Relation: Son Secondary Emergency Contact: Beatriz Stallion [place] Home Phone: [phone] Mobile Phone: [phone] Relation: Daughter  Code Status:  Full Code  Goals of care: Advanced Directive information    01/09/2024    2:09 PM  Advanced Directives  Does Patient Have a Medical Advance Directive? Yes  Type of Advance Directive Healthcare Power of Attorney  Does patient want to make changes to medical advance directive? No - Patient declined  Copy of Healthcare Power of Attorney in Chart? Yes - validated most recent copy scanned in chart (See row information)     Chief Complaint  Patient presents with   Medical Management of Chronic Issues    Routine appointment.     Discussed the use of AI scribe software for clinical note transcription with the patient, who gave  verbal consent to proceed.  History of Present Illness   [name] [name] is a 89 year old female with hypertension, asthma, and a history of stroke who presents for a regular six-month visit. She is accompanied by son Mr. Valenza, her caregiver.  She experiences frequent dizziness, particularly exacerbated by positional changes such as sitting up. No associated headaches, chest pain, or syncope. Her appetite remains good, and she maintains adequate hydration. She reports no issues with urination, such as burning or itching, and her bowel movements are regular, though she experiences some straining. No wounds or rashes. She denies feeling depressed or anxious and reports good sleep.  She has a history of hypertension and monitors her blood pressure at home, with recent readings of 120/60 mmHg and a heart rate of 70 bpm, indicating good control. Her current medications include amlodipine 5 mg and furosemide 20 mg.  Regarding her asthma, she denies recent episodes of wheezing or shortness of breath. She uses Symbicort, two puffs twice daily, for management.  She has a history of stroke with residual right-sided weakness but is able to transfer from bed to chair independently. She is currently on Plavix 75 mg for stroke prevention.  Recent lab work showed a slightly elevated glucose level and stable anemia with a hemoglobin of 11.4 g/dL. No blood in stool reported. She uses ferrous sulfate for anemia management. Her current medications include vitamin C 500 mg, a multivitamin, CoQ10, Taviscan chewable tablets (two tablets twice daily), Tylenol as needed for pain, Prevacivision (two capsules), Anusol  for hemorrhoids as needed, [place] eye drops at bedtime, a compounded cream for peripheral neuropathy, calcium and vitamin D, ferrous sulfate for anemia, Risperdal 0.5 mg twice daily, atorvastatin 10 mg, and Colace as needed. She is not currently using potassium supplements due to difficulty swallowing the  pills.      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   Edema extremities    GERD (gastroesophageal reflux disease)    Glaucoma    Macular degeneration    Per patient    Osteoarthritis    PVD (peripheral vascular disease) (HCC)    Seasonal allergies    Stroke [facility])    Past Surgical History:  Procedure Laterality Date   ABDOMINAL HYSTERECTOMY  1981   TONSILLECTOMY  1953    Allergies  Allergen Reactions   Aspirin Other (See Comments)    sweats   Codeine Other (See Comments)    Unknown reaction   Iodine Other (See Comments)    Pt is not aware of this allergy, does not recall much info.   Prednisone Other (See Comments)    sweats   Lyrica [Pregabalin]     Wobbly, "more than sleepy"   Benzonatate Itching and Rash   Sulfa Antibiotics Other (See Comments)    Doesn't remember Other reaction(s): Unknown    Allergies as of 01/09/2024       Reactions   Aspirin Other (See Comments)   sweats   Codeine Other (See Comments)   Unknown reaction   Iodine Other (See Comments)   Pt is not aware of this allergy, does not recall much info.   Prednisone Other (See Comments)   sweats   Lyrica [pregabalin]    Wobbly, "more than sleepy"   Benzonatate Itching, Rash   Sulfa Antibiotics Other (See Comments)   Doesn't remember Other reaction(s): Unknown        Medication List        Accurate as of January 09, 2024 11:59 PM. If you have any questions, ask your nurse or doctor.          STOP taking these medications    benzonatate 100 MG capsule Commonly known as: Lawyer Stopped by: Donalee Citrin [name]   Klor-Con M20 20 MEQ tablet Generic drug: potassium chloride SA Stopped by: Donalee Citrin Tashanda Fuhrer   predniSONE 20 MG tablet Commonly known as: DELTASONE Stopped by: Donalee Citrin Allia Wiltsey       TAKE these medications    acetaminophen 500 MG tablet Commonly known as: TYLENOL Take 500  mg by mouth every 6 (six) hours as needed for mild pain or moderate pain.   alum hydroxide-mag trisilicate 80-20 MG Chew chewable tablet Commonly known as: GAVISCON Chew 2 tablets by mouth 2 (two) times daily.   amLODipine 5 MG tablet Commonly known as: NORVASC TAKE 1 TABLET (5 MG TOTAL) BY MOUTH DAILY. PLEASE CALL [phone] TO SCHEDULE AN APPOINTMENT   ascorbic acid 500 MG tablet Commonly known as: VITAMIN C Take 500 mg by mouth daily.   atorvastatin 10 MG tablet Commonly known as: LIPITOR Take 1 tablet (10 mg total) by mouth daily.   Brinzolamide-Brimonidine 1-0.2 % Susp Apply 1 drop to eye 3 (three) times daily. Both eyes   budesonide-formoterol 80-4.5 MCG/ACT inhaler Commonly known as: SYMBICORT Inhale 2 puffs into the lungs 2 (two) times daily. What changed: when to take this Changed by: Donalee Citrin Tishawna Larouche  Calcium Citrate + D3 200-6.25 MG-MCG Tabs Generic drug: Calcium Citrate-Vitamin D Take 1 tablet by mouth daily.   cetaphil cream Apply topically 2 (two) times daily. To legs   clopidogrel 75 MG tablet Commonly known as: PLAVIX Take 1 tablet (75 mg total) by mouth daily.   CoQ10 50 MG Caps Take 50 mg by mouth daily.   docusate sodium 100 MG capsule Commonly known as: COLACE Take 100 mg by mouth 2 (two) times daily as needed for mild constipation.   ferrous sulfate 325 (65 FE) MG EC tablet Take 1 tablet (325 mg total) by mouth daily.   furosemide 20 MG tablet Commonly known as: LASIX Take 1 tablet (20 mg total) by mouth daily as needed for edema.   hydrocortisone 2.5 % rectal cream Commonly known as: Anusol-HC Place 1 Application rectally daily as needed for hemorrhoids or anal itching.   NONFORMULARY OR COMPOUNDED ITEM [facility]:  Peripheral Neuropathy Cream - Bupivacaine 1%, Doxepin 3%, Gabapentin 6%, Pentoxifylline 3%, Topiramate 1%, apply 1-2 grams to affected areas 3-4 times daily prn.   risperiDONE 0.5 MG tablet Commonly known as:  RISPERDAL Take 1 tablet (0.5 mg total) by mouth 2 (two) times daily.   Rocklatan 0.02-0.005 % Soln Generic drug: Netarsudil-Latanoprost Place 1 drop into the left eye at bedtime.   PreserVision AREDS 2 Caps Take 1 capsule by mouth 2 (two) times daily.   Senior Multivitamin Plus Tabs Take 1 tablet by mouth daily.        Review of Systems  Constitutional:  Negative for appetite change, chills, fatigue, fever and unexpected weight change.  HENT:  Negative for congestion, dental problem, ear discharge, ear pain, facial swelling, hearing loss, nosebleeds, postnasal drip, rhinorrhea, sinus pressure, sinus pain, sneezing, sore throat, tinnitus and trouble swallowing.   Eyes:  Positive for visual disturbance. Negative for pain, discharge, redness and itching.       Blind   Respiratory:  Negative for cough, chest tightness, shortness of breath and wheezing.   Cardiovascular:  Negative for chest pain, palpitations and leg swelling.  Gastrointestinal:  Negative for abdominal distention, abdominal pain, blood in stool, constipation, diarrhea, nausea and vomiting.  Endocrine: Negative for cold intolerance, heat intolerance, polydipsia, polyphagia and polyuria.  Genitourinary:  Negative for difficulty urinating, dysuria, flank pain, frequency and urgency.  Musculoskeletal:  Positive for arthralgias and gait problem. Negative for back pain, joint swelling, myalgias, neck pain and neck stiffness.  Skin:  Negative for color change, pallor, rash and wound.  Neurological:  Negative for dizziness, syncope, speech difficulty, weakness, light-headedness, numbness and headaches.       Chronic vertigo   Hematological:  Does not bruise/bleed easily.  Psychiatric/Behavioral:  Negative for agitation, behavioral problems, confusion, hallucinations, self-injury, sleep disturbance and suicidal ideas. The patient is not nervous/anxious.     Immunization History  Administered Date(s) Administered   Fluad  Quad(high Dose 65+) 06/04/2019, 07/21/2020   Influenza, High Dose Seasonal PF 10/19/2013, 08/02/2014, 08/11/2015, 11/22/2016, 07/16/2017, 11/21/2017, 07/20/2018, 11/19/2018, 11/18/2019, 11/17/2020, 11/28/2021, 07/23/2022, 06/26/2023   Influenza,inj,Quad PF,6+ Mos 06/11/2013, 06/30/2014, 07/17/2015, 06/27/2016   Influenza-Unspecified 07/07/2012   Moderna Covid-19 Vaccine Bivalent Booster 75yrs & up 06/26/2023   Moderna Sars-Covid-2 Vaccination 10/07/2022   PFIZER(Purple Top)SARS-COV-2 Vaccination 11/20/2019, 12/13/2019, 09/20/2020, 11/17/2020, 07/18/2021   Pfizer Covid-19 Vaccine Bivalent Booster 74yrs & up 07/18/2021   Pneumococcal Conjugate-13 11/07/2014   Pneumococcal Polysaccharide-23 10/08/2007, 08/11/2015, 11/22/2016, 11/21/2017, 11/19/2018, 11/18/2019, 11/17/2020, 11/28/2021   Unspecified SARS-COV-2 Vaccination 07/23/2022   Zoster Recombinant(Shingrix) 07/31/2017, 12/09/2017  Pertinent  Health Maintenance Due  Topic Date Due   INFLUENZA VACCINE  05/07/2024   DEXA SCAN  Completed      04/14/2023   10:50 AM 07/11/2023   11:37 AM 07/11/2023    1:06 PM 09/26/2023   10:22 AM 01/09/2024    2:08 PM  Fall Risk  Falls in the past year? 0 0 0 0 0  Was there an injury with Fall? 0 0 0 0 0  Fall Risk Category Calculator 0 0 0 0 0  Patient at Risk for Falls Due to No Fall Risks No Fall Risks No Fall Risks  History of fall(s)  Fall risk Follow up Falls evaluation completed Falls evaluation completed Falls evaluation completed;Education provided;Falls prevention discussed  Falls evaluation completed   Functional Status Survey:    There were no vitals filed for this visit. There is no height or weight on file to calculate BMI. Physical Exam Constitutional:      Appearance: She is not ill-appearing.  Pulmonary:     Effort: Pulmonary effort is normal. No respiratory distress.  Neurological:     Mental Status: She is alert. Mental status is at baseline.  Psychiatric:        Mood and  Affect: Mood normal.        Behavior: Behavior normal.      Labs reviewed: Recent Labs    01/22/23 1120 05/01/23 1834 09/22/23 1432  NA 141 138 141  K 3.8 4.1 3.7  CL 104 105 107  CO2 26 23 24   GLUCOSE 79 103* 134*  BUN 13 18 11   CREATININE 0.75 0.70 0.66  CALCIUM 9.2 9.5 9.4   Recent Labs    05/01/23 1834  AST 27  ALT 17  ALKPHOS 79  BILITOT 1.0  PROT 7.2  ALBUMIN 3.5   Recent Labs    01/22/23 1120 05/01/23 1834 09/22/23 1432  WBC 6.4 7.7 9.3  NEUTROABS 3,366 3.3 4.6  HGB 11.9 11.6* 11.4*  HCT 35.2 34.0* 34.4*  MCV 90.0 89.7 92.2  PLT 248 243 271   Lab Results  Component Value Date   TSH 2.25 11/27/2022   Lab Results  Component Value Date   HGBA1C 5.3 04/14/2023   Lab Results  Component Value Date   CHOL 110 11/27/2022   HDL 50 11/27/2022   LDLCALC 47 11/27/2022   LDLDIRECT 28 09/12/2022   TRIG 45 11/27/2022   CHOLHDL 2.2 11/27/2022    Significant Diagnostic Results in last 30 days:  No results found.  Assessment/Plan  Dizziness Intermittent dizziness without headache, chest pain, or syncope. Blood pressure stable at 120/60 with heart rate of 70, suggesting dizziness is not due to hypotension or hypertension. Possible causes include inner ear issues or positional changes. - Ensure adequate hydration - Report any changes or worsening of symptoms  Hypertension Blood pressure well-controlled with current readings of 120/60. - Continue amlodipine 5 mg  Asthma No recent episodes of wheezing or shortness of breath, indicating well-controlled asthma. - Continue Symbicort, 2 puffs twice daily  Peripheral Edema Leg swelling managed with compression stockings. No new issues reported. - Continue use of compression stockings during the day  hx of Stroke Residual right-sided weakness post-stroke. No new neurological symptoms reported. - Continue Plavix 75 mg daily for stroke prevention  Anemia Mild anemia with stable hemoglobin levels (11.4  g/dL). No signs of gastrointestinal bleeding. - Order repeat lab work to monitor hemoglobin levels - Continue ferrous sulfate supplementation   Family/ staff Communication: Reviewed  plan of care with patient and son verbalized understanding   Labs/tests ordered:  - CBC with Differential/Platelet - CMP with eGFR([facility]) - TSH - Lipid panel  Next Appointment : Return in about 6 months (around 07/10/2024) for medical mangement of chronic issues., fasting labs in one week.  I connected with  TAMSIN NADER on 01/11/24 by a video enabled telemedicine application and verified that I am speaking with the correct person using two identifiers.   I discussed the limitations of evaluation and management by telemedicine. The patient expressed understanding and agreed to proceed.   Spent 30 minutes of face to face with patient  >50% time spent counseling; reviewing medical record; tests; labs; and developing future plan of care.  Caesar Bookman, NP

## 2024-01-19 ENCOUNTER — Ambulatory Visit: Admitting: Podiatry

## 2024-01-21 ENCOUNTER — Other Ambulatory Visit

## 2024-01-21 ENCOUNTER — Other Ambulatory Visit: Payer: Self-pay | Admitting: Family

## 2024-01-21 DIAGNOSIS — I10 Essential (primary) hypertension: Secondary | ICD-10-CM | POA: Diagnosis not present

## 2024-01-21 DIAGNOSIS — R600 Localized edema: Secondary | ICD-10-CM | POA: Diagnosis not present

## 2024-01-21 DIAGNOSIS — J4520 Mild intermittent asthma, uncomplicated: Secondary | ICD-10-CM | POA: Diagnosis not present

## 2024-01-21 DIAGNOSIS — E785 Hyperlipidemia, unspecified: Secondary | ICD-10-CM

## 2024-01-22 ENCOUNTER — Ambulatory Visit (INDEPENDENT_AMBULATORY_CARE_PROVIDER_SITE_OTHER): Admitting: Podiatry

## 2024-01-22 ENCOUNTER — Encounter: Payer: Self-pay | Admitting: Podiatry

## 2024-01-22 DIAGNOSIS — L603 Nail dystrophy: Secondary | ICD-10-CM | POA: Diagnosis not present

## 2024-01-22 LAB — COMPLETE METABOLIC PANEL WITHOUT GFR
AG Ratio: 1.2 (calc) (ref 1.0–2.5)
ALT: 19 U/L (ref 6–29)
AST: 25 U/L (ref 10–35)
Albumin: 3.7 g/dL (ref 3.6–5.1)
Alkaline phosphatase (APISO): 79 U/L (ref 37–153)
BUN: 25 mg/dL (ref 7–25)
CO2: 26 mmol/L (ref 20–32)
Calcium: 9.2 mg/dL (ref 8.6–10.4)
Chloride: 103 mmol/L (ref 98–110)
Creat: 0.72 mg/dL (ref 0.60–0.95)
Globulin: 3 g/dL (ref 1.9–3.7)
Glucose, Bld: 117 mg/dL (ref 65–139)
Potassium: 4 mmol/L (ref 3.5–5.3)
Sodium: 138 mmol/L (ref 135–146)
Total Bilirubin: 0.7 mg/dL (ref 0.2–1.2)
Total Protein: 6.7 g/dL (ref 6.1–8.1)

## 2024-01-22 LAB — LIPID PANEL
Cholesterol: 118 mg/dL (ref <200)
HDL: 57 mg/dL (ref >=50)
LDL Cholesterol (Calc): 47 mg/dL
Non-HDL Cholesterol (Calc): 61 mg/dL (ref <130)
Total CHOL/HDL Ratio: 2.1 (calc) (ref <5.0)
Triglycerides: 48 mg/dL (ref <150)

## 2024-01-22 LAB — CBC WITH DIFFERENTIAL/PLATELET
Absolute Lymphocytes: 2326 {cells}/uL (ref 850–3900)
Absolute Monocytes: 734 {cells}/uL (ref 200–950)
Basophils Absolute: 22 {cells}/uL (ref 0–200)
Basophils Relative: 0.3 %
Eosinophils Absolute: 94 {cells}/uL (ref 15–500)
Eosinophils Relative: 1.3 %
HCT: 35.9 % (ref 35.0–45.0)
Hemoglobin: 11.7 g/dL (ref 11.7–15.5)
MCH: 30.5 pg (ref 27.0–33.0)
MCHC: 32.6 g/dL (ref 32.0–36.0)
MCV: 93.5 fL (ref 80.0–100.0)
MPV: 10.3 fL (ref 7.5–12.5)
Monocytes Relative: 10.2 %
Neutro Abs: 4025 {cells}/uL (ref 1500–7800)
Neutrophils Relative %: 55.9 %
Platelets: 230 10*3/uL (ref 140–400)
RBC: 3.84 10*6/uL (ref 3.80–5.10)
RDW: 13.9 % (ref 11.0–15.0)
Total Lymphocyte: 32.3 %
WBC: 7.2 10*3/uL (ref 3.8–10.8)

## 2024-01-22 LAB — TSH: TSH: 1.44 m[IU]/L (ref 0.40–4.50)

## 2024-01-22 NOTE — Progress Notes (Signed)
 Subjective:  [name], female    DOB: December 22, 1923,  MRN: [id number]  Chief Complaint  Patient presents with   Nail Problem    Pt stated that the left hallux nail is trying to come off     89 y.o. female presents with the above complaint.  Patient presents with complaint left hallux nail dystrophy pain on palpation arch with ambulation as per pressure the patient would like to have it removed.  She states has been causing her some discomfort denies seeing anyone postpartum see me denies any other acute complaints.   Review of Systems: Negative except as noted in the HPI. Denies N/V/F/Ch.  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   Edema extremities    GERD (gastroesophageal reflux disease)    Glaucoma    Macular degeneration    Per patient    Osteoarthritis    PVD (peripheral vascular disease) (HCC)    Seasonal allergies    Stroke [facility])     Current Outpatient Medications:    acetaminophen (TYLENOL) 500 MG tablet, Take 500 mg by mouth every 6 (six) hours as needed for mild pain or moderate pain. , Disp: , Rfl:    alum hydroxide-mag trisilicate (GAVISCON) 80-20 MG CHEW chewable tablet, Chew 2 tablets by mouth 2 (two) times daily., Disp: , Rfl:    amLODipine (NORVASC) 5 MG tablet, TAKE 1 TABLET (5 MG TOTAL) BY MOUTH DAILY. PLEASE CALL [phone] TO SCHEDULE AN APPOINTMENT, Disp: 90 tablet, Rfl: 1   atorvastatin (LIPITOR) 10 MG tablet, Take 1 tablet (10 mg total) by mouth daily., Disp: 90 tablet, Rfl: 1   Brinzolamide-Brimonidine 1-0.2 % SUSP, Apply 1 drop to eye 3 (three) times daily. Both eyes, Disp: 8 mL, Rfl: 3   budesonide-formoterol (SYMBICORT) 80-4.5 MCG/ACT inhaler, Inhale 2 puffs into the lungs 2 (two) times daily., Disp: 1 each, Rfl: 3   Calcium Citrate-Vitamin D (CALCIUM CITRATE + D3) 200-6.25 MG-MCG TABS, Take 1 tablet by mouth daily., Disp: 90 tablet, Rfl: 1    clopidogrel (PLAVIX) 75 MG tablet, Take 1 tablet (75 mg total) by mouth daily., Disp: 90 tablet, Rfl: 3   Coenzyme Q10 (COQ10) 50 MG CAPS, Take 50 mg by mouth daily., Disp: , Rfl:    Emollient (CETAPHIL) cream, Apply topically 2 (two) times daily. To legs (Patient not taking: Reported on 01/09/2024), Disp: 90 g, Rfl: 0   ferrous sulfate 325 (65 FE) MG EC tablet, Take 1 tablet (325 mg total) by mouth daily., Disp: 90 tablet, Rfl: 1   furosemide (LASIX) 20 MG tablet, Take 1 tablet (20 mg total) by mouth daily as needed for edema., Disp: 90 tablet, Rfl: 1   hydrocortisone (ANUSOL-HC) 2.5 % rectal cream, Place 1 Application rectally daily as needed for hemorrhoids or anal itching., Disp: 30 g, Rfl: 5   Multiple Vitamins-Minerals (PRESERVISION AREDS 2) CAPS, Take 1 capsule by mouth 2 (two) times daily., Disp: 180 capsule, Rfl: 1   Multiple Vitamins-Minerals (SENIOR MULTIVITAMIN PLUS) TABS, Take 1 tablet by mouth daily. , Disp: , Rfl:    NONFORMULARY OR COMPOUNDED ITEM, [facility]:  Peripheral Neuropathy Cream - Bupivacaine 1%, Doxepin 3%, Gabapentin 6%, Pentoxifylline 3%, Topiramate 1%, apply 1-2 grams to affected areas 3-4 times daily prn., Disp: 200 each, Rfl: 11   risperiDONE (RISPERDAL) 0.5 MG tablet, Take 1 tablet (0.5 mg total) by mouth 2 (two) times  daily., Disp: 180 tablet, Rfl: 3   ROCKLATAN 0.02-0.005 % SOLN, Place 1 drop into the left eye at bedtime., Disp: 2.5 mL, Rfl: 3   vitamin C (ASCORBIC ACID) 500 MG tablet, Take 500 mg by mouth daily., Disp: , Rfl:   Social History   Tobacco Use  Smoking Status Never  Smokeless Tobacco Never    Allergies  Allergen Reactions   Aspirin Other (See Comments)    sweats   Codeine Other (See Comments)    Unknown reaction   Iodine Other (See Comments)    Pt is not aware of this allergy, does not recall much info.   Prednisone Other (See Comments)    sweats   Lyrica [Pregabalin]     Wobbly, "more than sleepy"   Benzonatate Itching and Rash    Sulfa Antibiotics Other (See Comments)    Doesn't remember Other reaction(s): Unknown   Objective:  There were no vitals filed for this visit. There is no height or weight on file to calculate BMI. Constitutional Well developed. Well nourished.  Vascular Dorsalis pedis pulses palpable bilaterally. Posterior tibial pulses palpable bilaterally. Capillary refill normal to all digits.  No cyanosis or clubbing noted. Pedal hair growth normal.  Neurologic Normal speech. Oriented to person, place, and time. Epicritic sensation to light touch grossly present bilaterally.  Dermatologic Pain on palpation of the entire/total nail on 1st digit of the left No other open wounds. No skin lesions.  Orthopedic: Normal joint ROM without pain or crepitus bilaterally. No visible deformities. No bony tenderness.   Radiographs: None Assessment:   1. Nail dystrophy    Plan:  Patient was evaluated and treated and all questions answered.  Nail contusion/dystrophy hallux, left -Patient elects to proceed with minor surgery to remove entire toenail today. Consent reviewed and signed by patient. -Entire/total nail excised. See procedure note. -Educated on post-procedure care including soaking. Written instructions provided and reviewed. -Patient to follow up in 2 weeks for nail check.  Procedure: Excision of entire/total nail  Location: Left 1st toe digit Anesthesia: Lidocaine 1% plain; 1.5 mL and Marcaine 0.5% plain; 1.5 mL, digital block. Skin Prep: Betadine. Dressing: Silvadene; telfa; dry, sterile, compression dressing. Technique: Following skin prep, the toe was exsanguinated and a tourniquet was secured at the base of the toe. The affected nail border was freed and excised. The tourniquet was then removed and sterile dressing applied. Disposition: Patient tolerated procedure well. Patient to return in 2 weeks for follow-up.   No follow-ups on file.

## 2024-02-03 ENCOUNTER — Encounter: Payer: Self-pay | Admitting: Podiatry

## 2024-02-03 ENCOUNTER — Ambulatory Visit (INDEPENDENT_AMBULATORY_CARE_PROVIDER_SITE_OTHER): Payer: Medicare Other | Admitting: Podiatry

## 2024-02-03 DIAGNOSIS — G5793 Unspecified mononeuropathy of bilateral lower limbs: Secondary | ICD-10-CM

## 2024-02-03 DIAGNOSIS — M79674 Pain in right toe(s): Secondary | ICD-10-CM | POA: Diagnosis not present

## 2024-02-03 DIAGNOSIS — B351 Tinea unguium: Secondary | ICD-10-CM

## 2024-02-03 DIAGNOSIS — M79675 Pain in left toe(s): Secondary | ICD-10-CM | POA: Diagnosis not present

## 2024-02-08 NOTE — Progress Notes (Signed)
 Subjective:  [name], female    DOB: [birth year],  MRN: [id number]  [name] [name] presents to clinic today for at risk foot care with history of peripheral neuropathy and painful mycotic toenails of both feet that are difficult to trim. Pain interferes with daily activities and wearing enclosed shoe gear comfortably. She had total nail avulsion of left great toe performed by Dr. Lydia Sams earlier this month. She is accompanied by her son on today's visit. Chief Complaint  Patient presents with   routine foot care    Rm 17 Not diabetic/last visit Dr.Ngetich  2 months ago. Nail sx left great Dr. Lydia Sams 01/22/2024   New problem(s): None.   PCP is Ngetich, Elijio Guadeloupe, NP.  Allergies  Allergen Reactions   Aspirin Other (See Comments)    sweats   Codeine Other (See Comments)    Unknown reaction   Iodine Other (See Comments)    Pt is not aware of this allergy, does not recall much info.   Prednisone  Other (See Comments)    sweats   Lyrica  [Pregabalin ]     Wobbly, "more than sleepy"   Benzonatate  Itching and Rash   Sulfa Antibiotics Other (See Comments)    Doesn't remember Other reaction(s): Unknown   Review of Systems: Negative except as noted in the HPI.  Objective: No changes noted in today's physical examination. There were no vitals filed for this visit. [name] [name] is a pleasant 89 y.o. female in NAD. AAO x 3.  Vascular Examination: Capillary refill time immediate b/l. Vascular status intact b/l with palpable pedal pulses. Pedal hair present b/l. No pain with calf compression b/l. Skin temperature gradient WNL b/l. No cyanosis or clubbing b/l. No ischemia or gangrene noted b/l. No varicosities noted.  Neurological Examination: Sensation grossly intact b/l with 10 gram monofilament. Pt has subjective symptoms of neuropathy.  Dermatological Examination: Pedal skin with normal turgor, texture and tone b/l.  No open wounds. No interdigital macerations.   Toenails  right great toe and 2-5 b/l thick, discolored, elongated with subungual debris and pain on dorsal palpation.   Evidence of total matrixectomy left great toe. No corns, calluses nor porokeratotic lesions noted.  Musculoskeletal Examination: No pain, crepitus or joint limitation noted with ROM bilateral LE. Utilizes wheelchair for mobility assistance.  Radiographs: None  Last A1c:      Latest Ref Rng & Units 04/14/2023   11:38 AM  Hemoglobin A1C  Hemoglobin-A1c <5.7 % of total Hgb 5.3    Assessment/Plan: 1. Pain due to onychomycosis of toenails of both feet   2. Neuropathy of both feet   -Consent given for treatment as described below: -Examined patient. -Mycotic toenails 2-5 bilaterally and right great toe were debrided in length and girth with sterile nail nippers and dremel without iatrogenic bleeding. -Patient/POA to call should there be question/concern in the interim.   Return in about 3 months (around 05/04/2024).  Luella Sager, DPM      [facility] LOCATION: [address] [place] [address]                   Office [phone]   [facility] LOCATION: [address] [place], [place] [address]  Office [phone]

## 2024-05-12 ENCOUNTER — Encounter: Payer: Self-pay | Admitting: Podiatry

## 2024-05-12 ENCOUNTER — Ambulatory Visit (INDEPENDENT_AMBULATORY_CARE_PROVIDER_SITE_OTHER): Admitting: Podiatry

## 2024-05-12 DIAGNOSIS — B351 Tinea unguium: Secondary | ICD-10-CM

## 2024-05-12 DIAGNOSIS — M79675 Pain in left toe(s): Secondary | ICD-10-CM | POA: Diagnosis not present

## 2024-05-12 DIAGNOSIS — M79674 Pain in right toe(s): Secondary | ICD-10-CM | POA: Diagnosis not present

## 2024-05-12 DIAGNOSIS — G5793 Unspecified mononeuropathy of bilateral lower limbs: Secondary | ICD-10-CM

## 2024-05-13 ENCOUNTER — Other Ambulatory Visit: Payer: Self-pay | Admitting: Family

## 2024-05-13 DIAGNOSIS — R600 Localized edema: Secondary | ICD-10-CM

## 2024-05-16 NOTE — Progress Notes (Signed)
 Subjective:  [name], female    DOB: [birth year],  MRN: [id number]  [name] [name] presents to clinic today for at risk foot care with history of peripheral neuropathy and painful thick toenails that are difficult to trim. Pain interferes with ambulation. Aggravating factors include wearing enclosed shoe gear. Pain is relieved with periodic professional debridement. Her son is present during today's visit. [name] [name] is requesting compounded neuropathy cream from [facility]. Chief Complaint  Patient presents with   RFC    RFC Non diabetic toenail trim. LOV with PCP 01/09/24 telemed.   New problem(s): None.   PCP is Ngetich, [name] [name], [name] [name].  Allergies  Allergen Reactions   Aspirin Other (See Comments)    sweats   Codeine Other (See Comments)    Unknown reaction   Iodine Other (See Comments)    Pt is not aware of this allergy, does not recall much info.   Prednisone  Other (See Comments)    sweats   Lyrica  [Pregabalin ]     Wobbly, more than sleepy   Benzonatate  Itching and Rash   Sulfa Antibiotics Other (See Comments)    Doesn't remember Other reaction(s): Unknown    Review of Systems: Negative except as noted in the HPI.  Objective: No changes noted in today's physical examination. There were no vitals filed for this visit. [name] [name] is a pleasant 89 y.o. female thin build in NAD. AAO x 3.  Vascular Examination: Capillary refill time immediate b/l. Vascular status intact b/l with palpable pedal pulses. Pedal hair present b/l. No pain with calf compression b/l. Skin temperature gradient WNL b/l. No cyanosis or clubbing b/l. No ischemia or gangrene noted b/l.   Neurological Examination: Sensation grossly intact b/l with 10 gram monofilament. Pt has subjective symptoms of neuropathy.  Dermatological Examination: Pedal skin with normal turgor, texture and tone b/l.  No open wounds. No interdigital macerations.   Toenails 2-5 left, 1, 3, 5 right  thick, discolored, elongated with subungual debris and pain on dorsal palpation.   Anonychia noted L hallux, R 2nd toe, and R 4th toe. Nailbed(s) epithelialized.   Musculoskeletal Examination: Muscle strength 5/5 to all lower extremity muscle groups bilaterally. Utilizes wheelchair for mobility assistance.  Radiographs: None  Assessment/Plan: 1. Pain due to onychomycosis of toenails of both feet   2. Neuropathy of both feet   Patient was evaluated and treated. All patient's and/or POA's questions/concerns addressed on today's visit. Toenails 2-5 left foot, R hallux, R 3rd toe, and R 5th toe debrided in length and girth without incident. Continue soft, supportive shoe gear daily. Report any pedal injuries to medical professional. Call office if there are any questions/concerns. --A prescription was sent to [facility] for peripheral neuropathy cream which consists of: Bupivacaine 1%, doxepin 3%, gabapentin  6%, pentoxifylline 3%, and Topimarate 1%. Apply 1-2 grams to feet 3-4 times daily as needed for foot pain. -Patient/POA to call should there be question/concern in the interim.   Return in about 3 months (around 08/12/2024).  [name] [name], DPM      [facility] LOCATION: [address], [place] [address]  Office [phone]   [facility] LOCATION: [address] [place], [place] [address] Office [phone]

## 2024-05-17 ENCOUNTER — Ambulatory Visit: Payer: Self-pay | Admitting: *Deleted

## 2024-05-17 DIAGNOSIS — H401113 Primary open-angle glaucoma, right eye, severe stage: Secondary | ICD-10-CM | POA: Diagnosis not present

## 2024-05-17 DIAGNOSIS — H401124 Primary open-angle glaucoma, left eye, indeterminate stage: Secondary | ICD-10-CM | POA: Diagnosis not present

## 2024-05-17 NOTE — Telephone Encounter (Signed)
 Deward Slater FALCON, RN to [facility] (Selected Message)     05/17/24  8:40 AM Requesting appointment Wednesday due to transportation - see note   Called and spoke with son and he scheduled an appointment for Thursday with Dinah.

## 2024-05-17 NOTE — Telephone Encounter (Signed)
 FYI Only or Action Required?: Action required by provider: request for appointment.  Patient was last seen in primary care on 01/09/2024 by Ngetich, Roxan [name], NP.  Called Nurse Triage reporting vaginal irritation.  Symptoms began a week ago.  Interventions attempted: OTC medications: A&D ointment.  Symptoms are: gradually worsening.  Triage Disposition: See Physician Within 24 Hours  Patient/caregiver understands and will follow disposition?: Declines due to transportation- see note   Patient's son is calling to report symptoms: labial/buttock redness,irritation- appointment offered today- unable to bring patient today- requesting Wednesday appointment- 10am-on due to transportation nedds. No open appointment- will send message to schedule.  Reason for Disposition  [1] MILD to MODERATE vaginal pain AND [2] present > 24 hours  (Exception: Chronic pain.)  Answer Assessment - Initial Assessment Questions 1. SYMPTOM: What's the main symptom you're concerned about? (e.g., pain, itching, dryness)     Redness on labia and buttocks 2. LOCATION: Where is the  redness located? (e.g., inside/outside, left/right)     See above 3. ONSET: When did the  noticed  start?     5 days 4. PAIN: Is there any pain? If Yes, ask: How bad is it? (Scale: 1-10; mild, moderate, severe)     Burns to touch 5. ITCHING: Is there any itching? If Yes, ask: How bad is it? (Scale: 1-10; mild, moderate, severe)     no 6. CAUSE: What do you think is causing the discharge? Have you had the same problem before? What happened then?     unsure 7. OTHER SYMPTOMS: Do you have any other symptoms? (e.g., fever, itching, vaginal bleeding, pain with urination, injury to genital area, vaginal foreign body)     no  Protocols used: Vaginal Symptoms-A-AH  Copied from CRM #[id number]. Topic: Clinical - Red Word Triage >> May 17, 2024  8:17 AM Merlynn LABOR wrote: Red Word that prompted transfer to Nurse Triage: Redness  on vaginal area/burning

## 2024-05-20 ENCOUNTER — Encounter: Payer: Self-pay | Admitting: Family

## 2024-05-20 ENCOUNTER — Ambulatory Visit (INDEPENDENT_AMBULATORY_CARE_PROVIDER_SITE_OTHER): Admitting: Family

## 2024-05-20 VITALS — BP 128/72 | HR 60 | Temp 97.6°F | Resp 17

## 2024-05-20 DIAGNOSIS — R238 Other skin changes: Secondary | ICD-10-CM | POA: Diagnosis not present

## 2024-05-20 MED ORDER — ZINC OXIDE 20 % EX PSTE: 1.0000 | PASTE | Freq: Every day | CUTANEOUS | 3 refills | Status: DC | PRN

## 2024-05-20 NOTE — Progress Notes (Signed)
 Provider: Roxan Plough FNP-C  Jailey Booton, Roxan [name], NP  Patient Care Team: Rosena Bartle, Roxan [name], NP as PCP - General (Family Medicine) Shlomo Wilbert SAUNDERS, MD as PCP - Cardiology (Cardiology) Gershon Donnice SAUNDERS, DPM as Consulting Physician (Podiatry) Bond, Reyes Mcardle, MD as Referring Physician (Ophthalmology)  Extended Emergency Contact Information Primary Emergency Contact: Maclaren,Carl          [place] [address] [place] Home Phone: [phone] Mobile Phone: [phone] Relation: Son Secondary Emergency Contact: Headen,Sandra  [place] Home Phone: [phone] Mobile Phone: [phone] Relation: Daughter  Code Status:  Full Code  Goals of care: Advanced Directive information    05/20/2024    2:27 PM  Advanced Directives  Does Patient Have a Medical Advance Directive? No  Would patient like information on creating a medical advance directive? No - Patient declined     Chief Complaint  Patient presents with   Vaginal Itching    Discussed the use of AI scribe software for clinical note transcription with the patient, who gave verbal consent to proceed.  History of Present Illness   [name] [name] is a 89 year old female who presents with a reddish abrasion on the right inner thigh. Additional HPI provided by patient's son present during the visit who is her primary care giver.  She has a reddish abrasion on her right inner thigh, approximately three inches long, first noticed last week. The area is not painful or draining, and she has been applying A and D ointment. The patient reports that her aide told her the abrasion appears to be improving. She suspects irritation from her diaper or a scratch as possible causes.  She has a history of diabetes, which she considers might be related to the skin irritation. She uses a Nurse, adult for transfers, which can sometimes cause tightness and potential irritation in the area of the abrasion.  She  reports there are some minor sores that are improving. She has a good appetite and has been eating well.  She experiences some swelling in her legs and does not currently use compression stockings. She primarily sits in a wheelchair but has a recliner available at home. Son states recliner is too old.   No abdominal pain, and she has not had a bowel movement today.   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   Edema extremities    GERD (gastroesophageal reflux disease)    Glaucoma    Macular degeneration    Per patient    Osteoarthritis    PVD (peripheral vascular disease) (HCC)    Seasonal allergies    Stroke [facility])    Past Surgical History:  Procedure Laterality Date   ABDOMINAL HYSTERECTOMY  1981   TONSILLECTOMY  1953    Allergies  Allergen Reactions   Aspirin Other (See Comments)    sweats   Codeine Other (See Comments)    Unknown reaction   Iodine Other (See Comments)    Pt is not aware of this allergy, does not recall much info.   Prednisone  Other (See Comments)    sweats   Lyrica  [Pregabalin ]     Wobbly, more than sleepy   Benzonatate  Itching and Rash   Sulfa Antibiotics Other (See Comments)    Doesn't remember Other reaction(s): Unknown    Outpatient Encounter Medications as of 05/20/2024  Medication Sig   alum hydroxide-mag  trisilicate (GAVISCON) 80-20 MG CHEW chewable tablet Chew 2 tablets by mouth 2 (two) times daily.   amLODipine  (NORVASC ) 5 MG tablet TAKE 1 TABLET (5 MG TOTAL) BY MOUTH DAILY. PLEASE CALL [phone] TO SCHEDULE AN APPOINTMENT   atorvastatin  (LIPITOR) 10 MG tablet Take 1 tablet (10 mg total) by mouth daily.   Brinzolamide -Brimonidine  (SIMBRINZA ) 1-0.2 % SUSP Apply to eye 2 (two) times daily.   budesonide -formoterol  (SYMBICORT ) 80-4.5 MCG/ACT inhaler Inhale 2 puffs into the lungs 2 (two) times daily.   Calcium  Citrate-Vitamin D (CALCIUM  CITRATE +  D3) 200-6.25 MG-MCG TABS Take 1 tablet by mouth daily.   clopidogrel  (PLAVIX ) 75 MG tablet Take 1 tablet (75 mg total) by mouth daily.   Coenzyme Q10 (COQ10) 50 MG CAPS Take 50 mg by mouth daily.   Emollient (CETAPHIL) cream Apply topically 2 (two) times daily. To legs (Patient taking differently: Apply topically as needed. To legs)   ferrous sulfate  325 (65 FE) MG EC tablet Take 1 tablet (325 mg total) by mouth daily.   Multiple Vitamins-Minerals (PRESERVISION AREDS 2) CAPS Take 1 capsule by mouth 2 (two) times daily.   Multiple Vitamins-Minerals (SENIOR MULTIVITAMIN PLUS) TABS Take 1 tablet by mouth daily.    NONFORMULARY OR COMPOUNDED ITEM [facility]:  Peripheral Neuropathy Cream - Bupivacaine 1%, Doxepin 3%, Gabapentin  6%, Pentoxifylline 3%, Topiramate 1%, apply 1-2 grams to affected areas 3-4 times daily prn.   risperiDONE  (RISPERDAL ) 0.5 MG tablet Take 1 tablet (0.5 mg total) by mouth 2 (two) times daily.   ROCKLATAN  0.02-0.005 % SOLN Place 1 drop into the left eye at bedtime.   vitamin C  (ASCORBIC ACID ) 500 MG tablet Take 500 mg by mouth daily.   Zinc  Oxide 20 % PSTE Apply 1 Application topically daily as needed.   acetaminophen  (TYLENOL ) 500 MG tablet Take 500 mg by mouth every 6 (six) hours as needed for mild pain or moderate pain.    furosemide  (LASIX ) 20 MG tablet Take 1 tablet (20 mg total) by mouth daily as needed for edema.   hydrocortisone  (ANUSOL -HC) 2.5 % rectal cream Place 1 Application rectally daily as needed for hemorrhoids or anal itching.   [DISCONTINUED] Brinzolamide -Brimonidine  1-0.2 % SUSP Apply 1 drop to eye 3 (three) times daily. Both eyes (Patient not taking: Reported on 05/20/2024)   No facility-administered encounter medications on file as of 05/20/2024.    Review of Systems  Constitutional:  Negative for appetite change, chills, fatigue, fever and unexpected weight change.  Eyes:  Positive for visual disturbance.       Legally blind   Respiratory:   Negative for cough, chest tightness, shortness of breath and wheezing.   Cardiovascular:  Negative for chest pain, palpitations and leg swelling.  Gastrointestinal:  Negative for abdominal distention, abdominal pain, constipation, diarrhea, nausea and vomiting.  Genitourinary:  Negative for difficulty urinating, dysuria, flank pain, frequency and urgency.       Incontinent   Musculoskeletal:  Positive for gait problem. Negative for arthralgias, back pain, joint swelling and myalgias.  Skin:  Negative for color change, pallor, rash and wound.  Neurological:  Negative for dizziness, weakness, light-headedness, numbness and headaches.  Hematological:  Does not bruise/bleed easily.    Immunization History  Administered Date(s) Administered   Fluad Quad(high Dose 65+) 06/04/2019, 07/21/2020   Influenza, High Dose Seasonal PF 10/19/2013, 08/02/2014, 08/11/2015, 11/22/2016, 07/16/2017, 11/21/2017, 07/20/2018, 11/19/2018, 11/18/2019, 11/17/2020, 11/28/2021, 07/23/2022, 06/26/2023   Influenza,inj,Quad PF,6+ Mos 06/11/2013, 06/30/2014, 07/17/2015, 06/27/2016   Influenza-Unspecified 07/07/2012   Moderna Covid-19  Vaccine Bivalent Booster 68yrs & up 06/26/2023   Moderna Sars-Covid-2 Vaccination 10/07/2022   PFIZER(Purple Top)SARS-COV-2 Vaccination 11/20/2019, 12/13/2019, 09/20/2020, 11/17/2020, 07/18/2021   Pfizer Covid-19 Vaccine Bivalent Booster 19yrs & up 07/18/2021   Pneumococcal Conjugate-13 11/07/2014   Pneumococcal Polysaccharide-23 10/08/2007, 08/11/2015, 11/22/2016, 11/21/2017, 11/19/2018, 11/18/2019, 11/17/2020, 11/28/2021   Unspecified SARS-COV-2 Vaccination 07/23/2022   Zoster Recombinant(Shingrix) 07/31/2017, 12/09/2017   Pertinent  Health Maintenance Due  Topic Date Due   INFLUENZA VACCINE  05/07/2024   DEXA SCAN  Completed      07/11/2023   11:37 AM 07/11/2023    1:06 PM 09/26/2023   10:22 AM 01/09/2024    2:08 PM 05/20/2024    2:26 PM  Fall Risk  Falls in the past year? 0 0 0 0 0   Was there an injury with Fall? 0 0 0 0 0  Fall Risk Category Calculator 0 0 0 0 0  Patient at Risk for Falls Due to No Fall Risks No Fall Risks  History of fall(s) No Fall Risks  Fall risk Follow up Falls evaluation completed Falls evaluation completed;Education provided;Falls prevention discussed  Falls evaluation completed Falls evaluation completed   Functional Status Survey:    Vitals:   05/20/24 1434  BP: 128/72  Pulse: 60  Resp: 17  Temp: 97.6 F (36.4 C)  SpO2: 97%   There is no height or weight on file to calculate BMI. Physical Exam VITALS: T- 97.6, P- 60, BP- 128/72, SaO2- 96% MEASUREMENTS: Weight- 140. GENERAL: Alert, cooperative, well developed, no acute distress HEENT: Normocephalic, normal oropharynx, moist mucous membranes CHEST: Clear to auscultation bilaterally, no wheezes, rhonchi, or crackles CARDIOVASCULAR: Normal heart rate and rhythm, S1 and S2 normal without murmurs ABDOMEN: Soft, non-tender, non-distended, without organomegaly, normal bowel sounds EXTREMITIES: Mild swelling in legs, no cyanosis NEUROLOGICAL: Cranial nerves grossly intact, moves all extremities without gross motor or sensory deficit Skin : Unable to visualize right upper thigh area irritation site patient transfer's with hoyer lift   Labs reviewed: Recent Labs    09/22/23 1432 01/21/24 1339  NA 141 138  K 3.7 4.0  CL 107 103  CO2 24 26  GLUCOSE 134* 117  BUN 11 25  CREATININE 0.66 0.72  CALCIUM  9.4 9.2   Recent Labs    01/21/24 1339  AST 25  ALT 19  BILITOT 0.7  PROT 6.7   Recent Labs    09/22/23 1432 01/21/24 1339  WBC 9.3 7.2  NEUTROABS 4.6 4,025  HGB 11.4* 11.7  HCT 34.4* 35.9  MCV 92.2 93.5  PLT 271 230   Lab Results  Component Value Date   TSH 1.44 01/21/2024   Lab Results  Component Value Date   HGBA1C 5.3 04/14/2023   Lab Results  Component Value Date   CHOL 118 01/21/2024   HDL 57 01/21/2024   LDLCALC 47 01/21/2024   LDLDIRECT 28 09/12/2022    TRIG 48 01/21/2024   CHOLHDL 2.1 01/21/2024    Significant Diagnostic Results in last 30 days:  No results found.  Assessment/Plan  Right thigh skin abrasion Right thigh skin abrasion likely due to diaper irritation. The area is reddish, approximately three inches long, and located on the right side. No pain or drainage reported. The abrasion appears to be improving with current care. - Apply zinc  oxide or Desitin paste to the affected area to protect and promote healing. - Instruct caregivers to pat the area gently when cleaning to allow new cells to grow. - Ensure the diaper fits  properly and is applied gently to avoid further irritation. - Monitor the area for any signs of worsening or infection.  Lower extremity edema Persistent lower extremity edema. No compression stockings currently in use. Edema management is important to prevent complications. - Encourage the use of compression stockings to manage swelling. - Advise elevating legs when possible, especially when sitting in a recliner, to reduce edema.   Family/ staff Communication: Reviewed plan of care with patient and son verbalized understanding   Labs/tests ordered: None   Next Appointment: Return if symptoms worsen or fail to improve.   Total time: 20 minutes. Greater than 50% of total time spent doing patient education regarding Lower extremity edema,skin irritation on right thigh,health maintenance including symptom/medication management.   Roxan JAYSON Plough, NP

## 2024-06-10 ENCOUNTER — Other Ambulatory Visit: Payer: Self-pay | Admitting: Family

## 2024-06-10 NOTE — Telephone Encounter (Signed)
 High risk or very high risk warning populated when attempting to refill medication. RX request sent to PCP for review and approval if warranted.

## 2024-07-08 ENCOUNTER — Ambulatory Visit: Payer: Self-pay

## 2024-07-08 ENCOUNTER — Encounter: Payer: Self-pay | Admitting: Family

## 2024-07-08 ENCOUNTER — Telehealth (INDEPENDENT_AMBULATORY_CARE_PROVIDER_SITE_OTHER): Admitting: Family

## 2024-07-08 DIAGNOSIS — R600 Localized edema: Secondary | ICD-10-CM | POA: Diagnosis not present

## 2024-07-08 MED ORDER — RISPERIDONE 0.5 MG PO TABS: 0.5000 mg | ORAL_TABLET | Freq: Two times a day (BID) | ORAL | 3 refills | Status: AC

## 2024-07-08 NOTE — Telephone Encounter (Signed)
 Copied from CRM [id number]. Topic: Clinical - Red Word Triage >> Jul 08, 2024 10:17 AM Zane F wrote: [facility] that prompted transfer to Nurse Triage:   Patient's daughter, Nena, is on the line to inform the office of the following:  Concern: swollen feet for a few days  Symptoms: neuropathy in feet  When did the symptoms start?: a few days ago  What have you done to aid in the concern ? Have you taken anything to assist with the matter?: Yes   Patient is using a compound on her feet to assist with symptoms.  Wanted to let you know I will be transferring you to further discuss your concern. Please be advised the nurse can assist with scheduling. Answer Assessment - Initial Assessment Questions Pt's daughter is calling to update staff and see if she needs an appt but is not with the patient and doesn't know all the information. Pts son is the primary care giver but he is in the hospital currently. There is another caregiver helping at the moment. Pt has been having bilateral feet swelling for the last couple of days. Dtr suggested last night for them to put compression stockings on. She is unsure if that made a difference. There is also a compound the pt uses for the neuropathy in her feet. It doesn't sound like that has been done. Dtr is going to have caregiver call back with updated information.  RN asked if she was wanting an appt or just information. Depending on what the caregiver says will determine if the patient needs to be seen or if this can be home care. Pts dtr was very thankful.    1. ONSET: When did the swelling start? (e.g., minutes, hours, days)     A couple of days 2. LOCATION: What part of the leg is swollen?  Are both legs swollen or just one leg?     Both feet 3. SEVERITY: How bad is the swelling? (e.g., localized; mild, moderate, severe)     unknown 4. REDNESS: Is there redness or signs of infection?     unknown 5. PAIN: Is the swelling painful to  touch? If Yes, ask: How painful is it?   (Scale 1-10; mild, moderate or severe)     none 6. FEVER: Do you have a fever? If Yes, ask: What is it, how was it measured, and when did it start?      unknown 7. CAUSE: What do you think is causing the leg swelling?     unknown 8. MEDICAL HISTORY: Do you have a history of blood clots (e.g., DVT), cancer, heart failure, kidney disease, or liver failure?     neuropathy 9. RECURRENT SYMPTOM: Have you had leg swelling before? If Yes, ask: When was the last time? What happened that time?     unknown 10. OTHER SYMPTOMS: Do you have any other symptoms? (e.g., chest pain, difficulty breathing)       No other symptoms  Protocols used: Leg Swelling and Edema-A-AH

## 2024-07-08 NOTE — Telephone Encounter (Signed)
 FYI Only or Action Required?: Action required by provider: update on patient condition and follow up needed.  Patient was last seen in primary care on 05/20/2024 by Ngetich, Roxan [name], NP.  Called Nurse Triage reporting Leg Swelling.  Symptoms began no contact call.  Interventions attempted: Other: no contact call.  Symptoms are: no triage call.  Triage Disposition: No Contact Calls (overriding Information or Advice Only Call)  Patient/caregiver understands and will follow disposition?:    Reason for Disposition  [1] Caller is not with the adult (patient) AND [2] probable NON-URGENT symptoms  Answer Assessment - Initial Assessment Questions 1. REASON FOR CALL: What is the main reason for your call? or How can I best help you?      Patient daughter called earlier for triage, she was not with the patient and did not have all details needed to complete triage, patient son is her usual caregiver but he is currently inpatient. Daughter left call as she will have current caregiver return call for updated information, No return calls have been received as of this writing. This Clinical research associate called home number of record and call answered by Son (caregiver) Lupita, he reports he cannot help as he is admitted to the nursing home right now.  Protocols used: Information Only Call - No Triage-A-AH

## 2024-07-08 NOTE — Telephone Encounter (Addendum)
 Patient needs an appointment. Mychart message sent. I also called patients daughter Nena and she was unable to further assist (does not live here) and suggested that we call someone else (another caregiver- Delford Coins, [phone]) as the primary caregiver, Sandra's brother is not available. Nena stated that it would be best if we could schedule patient for a video visit and [name] will be able to assist.  Spoke with [name] and scheduled visit for today at 3 pm

## 2024-07-08 NOTE — Progress Notes (Signed)
 This service is provided via telemedicine  No vital signs collected/recorded due to the encounter was a telemedicine visit.   Location of patient (ex: home, work):  Home  Patient consents to a telephone visit: Yes  Location of the provider (ex: office, home):  [facility], Office   Name of any referring provider:  N/A  Names of all persons participating in the telemedicine service and their role in the encounter:  Shelli Ferrier, CMA, Patient, and Caregiver [name] and Chrissie Dacquisto C, NP   Time spent on call:  9 min with medical assistant      Provider: Leif Loflin FNP-C  Oda Lansdowne, Roxan [name], NP  Patient Care Team: Praveen Coia, Roxan [name], NP as PCP - General (Family Medicine) Shlomo Wilbert SAUNDERS, MD as PCP - Cardiology (Cardiology) Gershon Donnice SAUNDERS, DPM as Consulting Physician (Podiatry) Bond, Reyes Mcardle, MD as Referring Physician (Ophthalmology)  Extended Emergency Contact Information Primary Emergency Contact: Palen,Carl          [place] [address] [place] Home Phone: [phone] Mobile Phone: [phone] Relation: Son Secondary Emergency Contact: Headen,Sandra  [place] Home Phone: [phone] Mobile Phone: [phone] Relation: Daughter  Code Status:  Full Code  Goals of care: Advanced Directive information    05/20/2024    2:27 PM  Advanced Directives  Does Patient Have a Medical Advance Directive? No  Would patient like information on creating a medical advance directive? No - Patient declined     Chief Complaint  Patient presents with   Leg Swelling    HPI:  Pt is a 89 y.o. female seen today for an acute visit for evaluation of worsening lower extremity edema.she denies any cough,wheezing or shortness of breath. No wounds or weeping noted.   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   Edema  extremities    GERD (gastroesophageal reflux disease)    Glaucoma    Macular degeneration    Per patient    Osteoarthritis    PVD (peripheral vascular disease)    Seasonal allergies    Stroke [facility])    Past Surgical History:  Procedure Laterality Date   ABDOMINAL HYSTERECTOMY  1981   TONSILLECTOMY  1953    Allergies  Allergen Reactions   Aspirin Other (See Comments)    sweats   Codeine Other (See Comments)    Unknown reaction   Iodine Other (See Comments)    Pt is not aware of this allergy, does not recall much info.   Prednisone  Other (See Comments)    sweats   Lyrica  [Pregabalin ]     Wobbly, more than sleepy   Benzonatate  Itching and Rash   Sulfa Antibiotics Other (See Comments)    Doesn't remember Other reaction(s): Unknown    Outpatient Encounter Medications as of 07/08/2024  Medication Sig   acetaminophen  (TYLENOL ) 500 MG tablet Take 500 mg by mouth 2 (two) times daily.   alum hydroxide-mag trisilicate (GAVISCON) 80-20 MG CHEW chewable tablet Chew 2 tablets by mouth 2 (two) times daily.   amLODipine  (NORVASC ) 5 MG tablet TAKE 1 TABLET (5 MG TOTAL) BY MOUTH DAILY. PLEASE CALL [phone] TO SCHEDULE AN APPOINTMENT   atorvastatin  (LIPITOR) 10 MG tablet Take 1 tablet (10 mg total) by mouth daily.   Brinzolamide -Brimonidine  (SIMBRINZA ) 1-0.2 % SUSP Apply to eye 2 (two) times daily.   budesonide -formoterol  (SYMBICORT ) 80-4.5 MCG/ACT  inhaler INHALE 2 PUFFS INTO THE LUNGS TWICE A DAY   Calcium  Citrate-Vitamin D (CALCIUM  CITRATE + D3) 200-6.25 MG-MCG TABS Take 1 tablet by mouth daily.   clopidogrel  (PLAVIX ) 75 MG tablet Take 1 tablet (75 mg total) by mouth daily.   Coenzyme Q10 (COQ10) 50 MG CAPS Take 50 mg by mouth daily.   ferrous sulfate  325 (65 FE) MG EC tablet Take 1 tablet (325 mg total) by mouth daily.   furosemide  (LASIX ) 20 MG tablet Take 1 tablet (20 mg total) by mouth daily as needed for edema.   hydrocortisone  (ANUSOL -HC) 2.5 % rectal cream Place 1 Application  rectally daily as needed for hemorrhoids or anal itching.   Multiple Vitamins-Minerals (PRESERVISION AREDS 2) CAPS Take 1 capsule by mouth 2 (two) times daily.   Multiple Vitamins-Minerals (SENIOR MULTIVITAMIN PLUS) TABS Take 1 tablet by mouth daily.    NONFORMULARY OR COMPOUNDED ITEM [facility]:  Peripheral Neuropathy Cream - Bupivacaine 1%, Doxepin 3%, Gabapentin  6%, Pentoxifylline 3%, Topiramate 1%, apply 1-2 grams to affected areas 3-4 times daily prn.   ROCKLATAN  0.02-0.005 % SOLN Place 1 drop into the left eye at bedtime.   [DISCONTINUED] risperiDONE  (RISPERDAL ) 0.5 MG tablet Take 1 tablet (0.5 mg total) by mouth 2 (two) times daily.   risperiDONE  (RISPERDAL ) 0.5 MG tablet Take 1 tablet (0.5 mg total) by mouth 2 (two) times daily.   [DISCONTINUED] acetaminophen  (TYLENOL ) 500 MG tablet Take 500 mg by mouth every 6 (six) hours as needed for mild pain or moderate pain.  (Patient not taking: Reported on 07/08/2024)   [DISCONTINUED] Emollient (CETAPHIL) cream Apply topically 2 (two) times daily. To legs (Patient not taking: Reported on 07/08/2024)   [DISCONTINUED] vitamin C  (ASCORBIC ACID ) 500 MG tablet Take 500 mg by mouth daily. (Patient not taking: Reported on 07/08/2024)   [DISCONTINUED] Zinc  Oxide 20 % PSTE Apply 1 Application topically daily as needed. (Patient not taking: Reported on 07/08/2024)   No facility-administered encounter medications on file as of 07/08/2024.    Review of Systems  Constitutional:  Negative for appetite change, chills, fatigue, fever and unexpected weight change.  HENT:  Negative for congestion, ear discharge, ear pain, hearing loss, nosebleeds, postnasal drip, rhinorrhea, sinus pressure, sinus pain, sneezing and sore throat.   Eyes:  Negative for pain, discharge, redness, itching and visual disturbance.  Respiratory:  Negative for cough, chest tightness, shortness of breath and wheezing.   Cardiovascular:  Positive for leg swelling. Negative for chest pain  and palpitations.  Gastrointestinal:  Negative for abdominal distention, abdominal pain, blood in stool, constipation, diarrhea, nausea and vomiting.  Musculoskeletal:  Negative for arthralgias, back pain, gait problem, joint swelling and myalgias.  Skin:  Negative for color change, pallor, rash and wound.  Neurological:  Negative for dizziness, weakness, light-headedness, numbness and headaches.    Immunization History  Administered Date(s) Administered   Fluad Quad(high Dose 65+) 06/04/2019, 07/21/2020   INFLUENZA, HIGH DOSE SEASONAL PF 10/19/2013, 08/02/2014, 08/11/2015, 11/22/2016, 07/16/2017, 11/21/2017, 07/20/2018, 11/19/2018, 11/18/2019, 11/17/2020, 11/28/2021, 07/23/2022, 06/26/2023   Influenza,inj,Quad PF,6+ Mos 06/11/2013, 06/30/2014, 07/17/2015, 06/27/2016   Influenza-Unspecified 07/07/2012   Moderna Covid-19 Vaccine Bivalent Booster 50yrs & up 06/26/2023   Moderna Sars-Covid-2 Vaccination 10/07/2022   PFIZER(Purple Top)SARS-COV-2 Vaccination 11/20/2019, 12/13/2019, 09/20/2020, 11/17/2020, 07/18/2021   Pfizer Covid-19 Vaccine Bivalent Booster 15yrs & up 07/18/2021   Pneumococcal Conjugate-13 11/07/2014   Pneumococcal Polysaccharide-23 10/08/2007, 08/11/2015, 11/22/2016, 11/21/2017, 11/19/2018, 11/18/2019, 11/17/2020, 11/28/2021   Unspecified SARS-COV-2 Vaccination 07/23/2022   Zoster Recombinant(Shingrix) 07/31/2017, 12/09/2017  Pertinent  Health Maintenance Due  Topic Date Due   Influenza Vaccine  05/07/2024   DEXA SCAN  Completed   Mammogram  Discontinued      07/11/2023   11:37 AM 07/11/2023    1:06 PM 09/26/2023   10:22 AM 01/09/2024    2:08 PM 05/20/2024    2:26 PM  Fall Risk  Falls in the past year? 0 0 0 0 0  Was there an injury with Fall? 0 0 0 0 0  Fall Risk Category Calculator 0 0 0 0 0  Patient at Risk for Falls Due to No Fall Risks No Fall Risks  History of fall(s) No Fall Risks  Fall risk Follow up Falls evaluation completed Falls evaluation  completed;Education provided;Falls prevention discussed  Falls evaluation completed Falls evaluation completed   Functional Status Survey:    There were no vitals filed for this visit. There is no height or weight on file to calculate BMI. Physical Exam Constitutional:      General: She is not in acute distress.    Appearance: She is not ill-appearing.  Pulmonary:     Effort: Pulmonary effort is normal. No respiratory distress.  Musculoskeletal:     Right lower leg: Edema present.     Left lower leg: Edema present.  Neurological:     Mental Status: She is alert and oriented to person, place, and time.  Psychiatric:        Mood and Affect: Mood normal.        Behavior: Behavior normal.    Labs reviewed: Recent Labs    09/22/23 1432 01/21/24 1339  NA 141 138  K 3.7 4.0  CL 107 103  CO2 24 26  GLUCOSE 134* 117  BUN 11 25  CREATININE 0.66 0.72  CALCIUM  9.4 9.2   Recent Labs    01/21/24 1339  AST 25  ALT 19  BILITOT 0.7  PROT 6.7   Recent Labs    09/22/23 1432 01/21/24 1339  WBC 9.3 7.2  NEUTROABS 4.6 4,025  HGB 11.4* 11.7  HCT 34.4* 35.9  MCV 92.2 93.5  PLT 271 230   Lab Results  Component Value Date   TSH 1.44 01/21/2024   Lab Results  Component Value Date   HGBA1C 5.3 04/14/2023   Lab Results  Component Value Date   CHOL 118 01/21/2024   HDL 57 01/21/2024   LDLCALC 47 01/21/2024   LDLDIRECT 28 09/12/2022   TRIG 48 01/21/2024   CHOLHDL 2.1 01/21/2024    Significant Diagnostic Results in last 30 days:  No results found.  Assessment/Plan  Edema of extremities (Primary) Bilateral lower extremity edema.Asymptomatic. - Advised to take Furosemide  20 mg tablet daily x 5 days along with Potassium chloride  then resume Furosemide  20 mg tablet daily as needed  - Keep legs elevated when seated to keep swelling down  - Reduce salt intake in diet   Family/ staff Communication: Reviewed plan of care with patient and daughter verbalized understanding    Labs/tests ordered: None   Next Appointment: Return if symptoms worsen or fail to improve.  Total time: 20 minutes. Greater than 50% of total time spent doing patient education regarding bilateral leg edema ,health maintenance including symptom/medication management.   I connected with  LAKISA LOTZ on 07/08/24 by a video enabled telemedicine application and verified that I am speaking with the correct person using two identifiers.   I discussed the limitations of evaluation and management by telemedicine. The patient expressed  understanding and agreed to proceed.   Roxan JAYSON Plough, NP

## 2024-07-15 ENCOUNTER — Encounter: Admitting: Family

## 2024-07-15 NOTE — Progress Notes (Signed)
   This encounter was created in error - please disregard. No show

## 2024-08-10 ENCOUNTER — Other Ambulatory Visit: Payer: Self-pay | Admitting: Family

## 2024-08-10 DIAGNOSIS — E785 Hyperlipidemia, unspecified: Secondary | ICD-10-CM

## 2024-08-11 ENCOUNTER — Ambulatory Visit: Payer: Self-pay | Admitting: Family

## 2024-08-11 ENCOUNTER — Encounter: Payer: Self-pay | Admitting: Family

## 2024-08-11 VITALS — BP 130/78 | HR 52 | Temp 97.3°F

## 2024-08-11 DIAGNOSIS — E782 Mixed hyperlipidemia: Secondary | ICD-10-CM

## 2024-08-11 DIAGNOSIS — F01B4 Vascular dementia, moderate, with anxiety: Secondary | ICD-10-CM | POA: Diagnosis not present

## 2024-08-11 DIAGNOSIS — J4530 Mild persistent asthma, uncomplicated: Secondary | ICD-10-CM | POA: Diagnosis not present

## 2024-08-11 DIAGNOSIS — Z8673 Personal history of transient ischemic attack (TIA), and cerebral infarction without residual deficits: Secondary | ICD-10-CM | POA: Diagnosis not present

## 2024-08-11 DIAGNOSIS — Z23 Encounter for immunization: Secondary | ICD-10-CM | POA: Diagnosis not present

## 2024-08-11 DIAGNOSIS — I739 Peripheral vascular disease, unspecified: Secondary | ICD-10-CM

## 2024-08-11 DIAGNOSIS — I482 Chronic atrial fibrillation, unspecified: Secondary | ICD-10-CM | POA: Diagnosis not present

## 2024-08-11 DIAGNOSIS — I10 Essential (primary) hypertension: Secondary | ICD-10-CM | POA: Diagnosis not present

## 2024-08-12 LAB — CBC WITH DIFFERENTIAL/PLATELET
Absolute Lymphocytes: 2363 {cells}/uL (ref 850–3900)
Absolute Monocytes: 746 {cells}/uL (ref 200–950)
Basophils Absolute: 33 {cells}/uL (ref 0–200)
Basophils Relative: 0.5 %
Eosinophils Absolute: 99 {cells}/uL (ref 15–500)
Eosinophils Relative: 1.5 %
HCT: 35 % (ref 35.0–45.0)
Hemoglobin: 11.6 g/dL — ABNORMAL LOW (ref 11.7–15.5)
MCH: 30.8 pg (ref 27.0–33.0)
MCHC: 33.1 g/dL (ref 32.0–36.0)
MCV: 92.8 fL (ref 80.0–100.0)
MPV: 9.8 fL (ref 7.5–12.5)
Monocytes Relative: 11.3 %
Neutro Abs: 3359 {cells}/uL (ref 1500–7800)
Neutrophils Relative %: 50.9 %
Platelets: 237 Thousand/uL (ref 140–400)
RBC: 3.77 Million/uL — ABNORMAL LOW (ref 3.80–5.10)
RDW: 14.1 % (ref 11.0–15.0)
Total Lymphocyte: 35.8 %
WBC: 6.6 Thousand/uL (ref 3.8–10.8)

## 2024-08-12 LAB — LIPID PANEL
Cholesterol: 115 mg/dL (ref <200)
HDL: 53 mg/dL (ref >=50)
LDL Cholesterol (Calc): 49 mg/dL
Non-HDL Cholesterol (Calc): 62 mg/dL (ref <130)
Total CHOL/HDL Ratio: 2.2 (calc) (ref <5.0)
Triglycerides: 57 mg/dL (ref <150)

## 2024-08-12 LAB — COMPREHENSIVE METABOLIC PANEL WITH GFR
AG Ratio: 1.3 (calc) (ref 1.0–2.5)
ALT: 17 U/L (ref 6–29)
AST: 21 U/L (ref 10–35)
Albumin: 3.5 g/dL — ABNORMAL LOW (ref 3.6–5.1)
Alkaline phosphatase (APISO): 74 U/L (ref 37–153)
BUN: 19 mg/dL (ref 7–25)
CO2: 31 mmol/L (ref 20–32)
Calcium: 9 mg/dL (ref 8.6–10.4)
Chloride: 101 mmol/L (ref 98–110)
Creat: 0.64 mg/dL (ref 0.60–0.95)
Globulin: 2.7 g/dL (ref 1.9–3.7)
Glucose, Bld: 112 mg/dL — ABNORMAL HIGH (ref 65–99)
Potassium: 3.6 mmol/L (ref 3.5–5.3)
Sodium: 138 mmol/L (ref 135–146)
Total Bilirubin: 0.7 mg/dL (ref 0.2–1.2)
Total Protein: 6.2 g/dL (ref 6.1–8.1)
eGFR: 80 mL/min/1.73m2 (ref >=60)

## 2024-08-12 LAB — TSH: TSH: 1.8 m[IU]/L (ref 0.40–4.50)

## 2024-08-16 ENCOUNTER — Other Ambulatory Visit: Payer: Self-pay | Admitting: Family

## 2024-08-16 DIAGNOSIS — R600 Localized edema: Secondary | ICD-10-CM

## 2024-08-16 MED ORDER — FUROSEMIDE 20 MG PO TABS: 20.0000 mg | ORAL_TABLET | Freq: Every day | ORAL | 1 refills | Status: AC | PRN

## 2024-08-16 NOTE — Telephone Encounter (Signed)
 Copied from CRM [id number]. Topic: Clinical - Medication Refill >> Aug 16, 2024  8:46 AM Cherylann RAMAN wrote: Medication: furosemide  (LASIX ) 20 MG tablet  Has the patient contacted their pharmacy? No (Agent: If no, request that the patient contact the pharmacy for the refill. If patient does not wish to contact the pharmacy document the reason why and proceed with request.) (Agent: If yes, when and what did the pharmacy advise?)  This is the patient's preferred pharmacy:  [facility] [phone] [address], [place] - [address] [place] [address] Phone: [phone] Fax: [phone]  Is this the correct pharmacy for this prescription? Yes If no, delete pharmacy and type the correct one.   Has the prescription been filled recently? No  Is the patient out of the medication? Yes  Has the patient been seen for an appointment in the last year OR does the patient have an upcoming appointment? Yes  Can we respond through MyChart? Yes  Agent: Please be advised that Rx refills may take up to 3 business days. We ask that you follow-up with your pharmacy.

## 2024-08-18 NOTE — Progress Notes (Signed)
 Provider: Roxan Plough FNP-C   Bryer Cozzolino, Roxan [name], NP  Patient Care Team: Axl Rodino, Roxan [name], NP as PCP - General (Family Medicine) Shlomo Wilbert SAUNDERS, MD as PCP - Cardiology (Cardiology) Gershon Donnice SAUNDERS, DPM as Consulting Physician (Podiatry) Bond, Reyes Mcardle, MD as Referring Physician (Ophthalmology)  Extended Emergency Contact Information Primary Emergency Contact: Sellers,Carl          [place] [address] [place] Home Phone: [phone] Mobile Phone: [phone] Relation: Son Secondary Emergency Contact: Headen,Sandra  [place] Home Phone: [phone] Mobile Phone: [phone] Relation: Daughter  Code Status: Full code Goals of care: Advanced Directive information    05/20/2024    2:27 PM  Advanced Directives  Does Patient Have a Medical Advance Directive? No  Would patient like information on creating a medical advance directive? No - Patient declined     Chief Complaint  Patient presents with   Medical Management of Chronic Issues    6 month follow-up, discuss headaches and request for stool softener. Patient would like flu vaccine today. Patient c/o soreness in glands (throat area).  Please advise on what Neurology is in reference to patients body.      Discussed the use of AI scribe software for clinical note transcription with the patient, who gave verbal consent to proceed.  History of Present Illness   [name] [name] is a 89 year old female who presents for a six-month follow-up appointment.  She has experienced recent swelling of the glands under her throat, which was initially painful but has since resolved. No associated cough, cold symptoms, fever, or chills were noted. The duration of the swelling is unspecified.  She has a history of asthma and is currently using Symbicort  inhaler twice daily. No recent episodes of palpitations or irregular heartbeat are reported.  She has peripheral vascular disease affecting her  legs, managed with a fluid pill 20 mg daily as needed. She experiences occasional leg swelling, which sometimes necessitates an increased dose of the diuretic.  She has a history of stroke and is on Plavix  75 mg daily. No recent bleeding episodes are reported.  Her medications include amlodipine  5 mg for hypertension, atorvastatin  10 mg daily for cholesterol management, calcium , vitamin D, and a multivitamin. She experiences constipation and has previously used a stool softener but is not currently taking any. Occasional mild headaches are reported.  She uses Prevacid  and Preservision Iris for her eyes, a cream from [place] for neuropathy, and [employer] eye drops once daily at bedtime. No recent delusions or hallucinations and reports her memory is 'pretty good'.  She has dry skin on her hands. She reports difficulty sleeping on her side and typically sleeps on her back.   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   Edema extremities    GERD (gastroesophageal reflux disease)    Glaucoma    Macular degeneration    Per patient    Osteoarthritis    PVD (peripheral vascular disease)    Seasonal allergies    Stroke [facility])    Past Surgical History:  Procedure Laterality Date   ABDOMINAL HYSTERECTOMY  1981   TONSILLECTOMY  1953    Allergies  Allergen Reactions   Aspirin Other (See Comments)    sweats   Codeine Other (See Comments)    Unknown reaction   Iodine Other (See Comments)    Pt  is not aware of this allergy, does not recall much info.   Prednisone  Other (See Comments)    sweats   Lyrica  [Pregabalin ]     Wobbly, more than sleepy   Benzonatate  Itching and Rash   Sulfa Antibiotics Other (See Comments)    Doesn't remember Other reaction(s): Unknown    Allergies as of 08/11/2024       Reactions   Aspirin Other (See Comments)   sweats   Codeine Other (See Comments)    Unknown reaction   Iodine Other (See Comments)   Pt is not aware of this allergy, does not recall much info.   Prednisone  Other (See Comments)   sweats   Lyrica  [pregabalin ]    Wobbly, more than sleepy   Benzonatate  Itching, Rash   Sulfa Antibiotics Other (See Comments)   Doesn't remember Other reaction(s): Unknown        Medication List        Accurate as of August 11, 2024 11:59 PM. If you have any questions, ask your nurse or doctor.          alum hydroxide-mag trisilicate 80-20 MG Chew chewable tablet Commonly known as: GAVISCON Chew 2 tablets by mouth 2 (two) times daily.   amLODipine  5 MG tablet Commonly known as: NORVASC  TAKE 1 TABLET (5 MG TOTAL) BY MOUTH DAILY. PLEASE CALL [phone] TO SCHEDULE AN APPOINTMENT   atorvastatin  10 MG tablet Commonly known as: LIPITOR TAKE 1 TABLET BY MOUTH EVERY DAY   budesonide -formoterol  80-4.5 MCG/ACT inhaler Commonly known as: SYMBICORT  INHALE 2 PUFFS INTO THE LUNGS TWICE A DAY   Calcium  Citrate + D3 200-6.25 MG-MCG Tabs Generic drug: Calcium  Citrate-Vitamin D Take 1 tablet by mouth daily.   clopidogrel  75 MG tablet Commonly known as: PLAVIX  Take 1 tablet (75 mg total) by mouth daily.   CoQ10 50 MG Caps Take 50 mg by mouth daily.   ferrous sulfate  325 (65 FE) MG EC tablet Take 1 tablet (325 mg total) by mouth daily.   furosemide  20 MG tablet Commonly known as: LASIX  Take 1 tablet (20 mg total) by mouth daily as needed for edema.   hydrocortisone  2.5 % rectal cream Commonly known as: Anusol -HC Place 1 Application rectally daily as needed for hemorrhoids or anal itching.   NONFORMULARY OR COMPOUNDED ITEM [facility]:  Peripheral Neuropathy Cream - Bupivacaine 1%, Doxepin 3%, Gabapentin  6%, Pentoxifylline 3%, Topiramate 1%, apply 1-2 grams to affected areas 3-4 times daily prn.   risperiDONE  0.5 MG tablet Commonly known as: RISPERDAL  Take 1 tablet (0.5 mg total) by mouth 2 (two) times daily.    Rocklatan  0.02-0.005 % Soln Generic drug: Netarsudil-Latanoprost  Place 1 drop into the left eye at bedtime.   PreserVision AREDS 2 Caps Take 1 capsule by mouth 2 (two) times daily.   Senior Multivitamin Plus Tabs Take 1 tablet by mouth daily.   Simbrinza  1-0.2 % Susp Generic drug: Brinzolamide -Brimonidine  Apply to eye 2 (two) times daily.   TYLENOL  500 MG tablet Generic drug: acetaminophen  Take 500 mg by mouth 2 (two) times daily.        Review of Systems  Constitutional:  Negative for appetite change, chills, fatigue, fever and unexpected weight change.  HENT:  Negative for congestion, dental problem, ear discharge, ear pain, facial swelling, hearing loss, nosebleeds, postnasal drip, rhinorrhea, sinus pressure, sinus pain, sneezing, sore throat, tinnitus and trouble swallowing.   Eyes:  Positive for visual disturbance. Negative for pain, discharge, redness and itching.  Legally blind   Respiratory:  Negative for cough, chest tightness, shortness of breath and wheezing.   Cardiovascular:  Negative for chest pain, palpitations and leg swelling.  Gastrointestinal:  Negative for abdominal distention, abdominal pain, blood in stool, constipation, diarrhea, nausea and vomiting.  Endocrine: Negative for cold intolerance, heat intolerance, polydipsia, polyphagia and polyuria.  Genitourinary:  Negative for difficulty urinating, dysuria, flank pain, frequency and urgency.  Musculoskeletal:  Positive for back pain and gait problem. Negative for arthralgias, joint swelling, myalgias, neck pain and neck stiffness.  Skin:  Negative for color change, pallor, rash and wound.  Neurological:  Negative for dizziness, syncope, speech difficulty, weakness, light-headedness, numbness and headaches.  Hematological:  Does not bruise/bleed easily.  Psychiatric/Behavioral:  Negative for agitation, behavioral problems, confusion, hallucinations, self-injury, sleep disturbance and suicidal ideas. The  patient is not nervous/anxious.     Immunization History  Administered Date(s) Administered   Fluad Quad(high Dose 65+) 06/04/2019, 07/21/2020   INFLUENZA, HIGH DOSE SEASONAL PF 10/19/2013, 08/02/2014, 08/11/2015, 11/22/2016, 07/16/2017, 11/21/2017, 07/20/2018, 11/19/2018, 11/18/2019, 11/17/2020, 11/28/2021, 07/23/2022, 06/26/2023, 08/11/2024   Influenza,inj,Quad PF,6+ Mos 06/11/2013, 06/30/2014, 07/17/2015, 06/27/2016   Influenza-Unspecified 07/07/2012   Moderna Covid-19 Vaccine Bivalent Booster 34yrs & up 06/26/2023   Moderna Sars-Covid-2 Vaccination 10/07/2022   PFIZER(Purple Top)SARS-COV-2 Vaccination 11/20/2019, 12/13/2019, 09/20/2020, 11/17/2020, 07/18/2021   Pfizer Covid-19 Vaccine Bivalent Booster 49yrs & up 07/18/2021   Pneumococcal Conjugate-13 11/07/2014   Pneumococcal Polysaccharide-23 10/08/2007, 08/11/2015, 11/22/2016, 11/21/2017, 11/19/2018, 11/18/2019, 11/17/2020, 11/28/2021   Unspecified SARS-COV-2 Vaccination 07/23/2022   Zoster Recombinant(Shingrix) 07/31/2017, 12/09/2017   Pertinent  Health Maintenance Due  Topic Date Due   Influenza Vaccine  Completed   DEXA SCAN  Completed   Mammogram  Discontinued      07/11/2023   11:37 AM 07/11/2023    1:06 PM 09/26/2023   10:22 AM 01/09/2024    2:08 PM 05/20/2024    2:26 PM  Fall Risk  Falls in the past year? 0 0 0 0 0  Was there an injury with Fall? 0 0 0 0 0  Fall Risk Category Calculator 0 0 0 0 0  Patient at Risk for Falls Due to No Fall Risks No Fall Risks  History of fall(s) No Fall Risks  Fall risk Follow up Falls evaluation completed Falls evaluation completed;Education provided;Falls prevention discussed  Falls evaluation completed Falls evaluation completed   Functional Status Survey:    Vitals:   08/11/24 1255  BP: 130/78  Pulse: (!) 52  Temp: (!) 97.3 F (36.3 C)  SpO2: 99%   There is no height or weight on file to calculate BMI. Physical Exam  GENERAL: Alert, cooperative, well developed, no acute  distress HEENT: Normocephalic, normal oropharynx, moist mucous membranes, ears normal, nose normal, no sinus tenderness NECK: No cervical lymphadenopathy CHEST: Clear to auscultation bilaterally, no wheezes, rhonchi, or crackles CARDIOVASCULAR: Normal heart rate and rhythm, S1 and S2 normal without murmurs ABDOMEN: Soft, non-tender, non-distended, without organomegaly, normal bowel sounds, no costovertebral angle tenderness EXTREMITIES: No cyanosis, mild edema in legs NEUROLOGICAL: Cranial nerves grossly intact, moves all extremities without gross motor or sensory deficit.unsteady gait on wheelchair during visit  SKIN: Dry skin on hands. No rash,no lesion or erythema   PSYCHIATRY/BEHAVIORAL: Mood stable    Labs reviewed: Recent Labs    09/22/23 1432 01/21/24 1339 08/11/24 1353  NA 141 138 138  K 3.7 4.0 3.6  CL 107 103 101  CO2 24 26 31   GLUCOSE 134* 117 112*  BUN 11 25 19  CREATININE 0.66 0.72 0.64  CALCIUM  9.4 9.2 9.0   Recent Labs    01/21/24 1339 08/11/24 1353  AST 25 21  ALT 19 17  BILITOT 0.7 0.7  PROT 6.7 6.2   Recent Labs    09/22/23 1432 01/21/24 1339 08/11/24 1353  WBC 9.3 7.2 6.6  NEUTROABS 4.6 4,025 3,359  HGB 11.4* 11.7 11.6*  HCT 34.4* 35.9 35.0  MCV 92.2 93.5 92.8  PLT 271 230 237   Lab Results  Component Value Date   TSH 1.80 08/11/2024   Lab Results  Component Value Date   HGBA1C 5.3 04/14/2023   Lab Results  Component Value Date   CHOL 115 08/11/2024   HDL 53 08/11/2024   LDLCALC 49 08/11/2024   LDLDIRECT 28 09/12/2022   TRIG 57 08/11/2024   CHOLHDL 2.2 08/11/2024    Significant Diagnostic Results in last 30 days:  No results found.  Assessment/Plan     Essential hypertension Blood pressure management is ongoing with amlodipine  5 mg daily. - Continue amlodipine  5 mg daily.  Mild persistent asthma Asthma is managed with Symbicort  inhaler. - Continue Symbicort  inhaler as prescribed.  Peripheral vascular disease of the  legs Peripheral vascular disease with leg swelling. Managed with furosemide  20 mg daily as needed. Compression stockings are used at home. Swelling persists, especially when not using compression stockings. - Continue furosemide  20 mg daily as needed. - Use compression stockings at home. - Elevate legs on a pillow when in bed to reduce swelling.  Mixed hyperlipidemia Managed with atorvastatin  10 mg daily. - Continue atorvastatin  10 mg daily.  Chronic atrial fibrillation Managed with Plavix  75 mg daily. No recent episodes of palpitations or irregular heartbeats reported. - Continue Plavix  75 mg daily.  Vascular dementia with anxiety Managed with Risperdal  twice daily. No recent delusions or hallucinations reported. - Continue Risperdal  twice daily.  Constipation Reports of constipation. Previously used stool softeners but currently not taking any. - Recommended over-the-counter stool softeners such as Senokot or Colace.  General Health Maintenance Flu shot administered. COVID vaccine not available at the clinic, advised to get it at a pharmacy. Pneumonia vaccine series completed. - Administered flu shot. - Advised to obtain COVID vaccine at a pharmacy.   Family/ staff Communication: Reviewed plan of care with patient and son verbalized understanding  Labs/tests ordered:  - CBC with Differential/Platelet - CMP with eGFR([facility]) - TSH - Lipid panel   Next Appointment : Return in about 6 months (around 02/08/2025) for medical mangement of chronic issues., Annual wellness visit.   Spent 30 minutes of Face to face and non-face to face with patient  >50% time spent counseling; reviewing medical record; tests; labs; documentation and developing future plan of care.   Roxan JAYSON Plough, NP

## 2024-08-27 ENCOUNTER — Ambulatory Visit: Payer: Self-pay | Admitting: Family

## 2024-08-31 ENCOUNTER — Ambulatory Visit: Admitting: Podiatry

## 2024-08-31 ENCOUNTER — Encounter: Payer: Self-pay | Admitting: Podiatry

## 2024-08-31 DIAGNOSIS — L89610 Pressure ulcer of right heel, unstageable: Secondary | ICD-10-CM | POA: Diagnosis not present

## 2024-08-31 DIAGNOSIS — L89620 Pressure ulcer of left heel, unstageable: Secondary | ICD-10-CM | POA: Diagnosis not present

## 2024-08-31 DIAGNOSIS — G5793 Unspecified mononeuropathy of bilateral lower limbs: Secondary | ICD-10-CM | POA: Diagnosis not present

## 2024-08-31 DIAGNOSIS — B351 Tinea unguium: Secondary | ICD-10-CM

## 2024-08-31 DIAGNOSIS — M79674 Pain in right toe(s): Secondary | ICD-10-CM

## 2024-08-31 DIAGNOSIS — M79675 Pain in left toe(s): Secondary | ICD-10-CM | POA: Diagnosis not present

## 2024-08-31 NOTE — Progress Notes (Signed)
 Subjective:  [name], female    DOB: 11-22-1923,  MRN: [id number]  [name] [name] presents to clinic today for at risk foot care with history of peripheral neuropathy and painful mycotic toenails of both feet that are difficult to trim. Pain interferes with daily activities and wearing enclosed shoe gear comfortably. She is accompanied by her son on today's visit. Patient states she has pain on the back of both heels.  Chief Complaint  Patient presents with   RFC    RFC not diabetic.  Patients heels are sore possibly from lying in bed for long hours. [name] [name], [name] [name] (PCP), 08/11/24     New problem(s): None.   PCP is [name] [name], [name] [name].  Allergies  Allergen Reactions   Aspirin Other (See Comments)    sweats   Codeine Other (See Comments)    Unknown reaction   Iodine Other (See Comments)    Pt is not aware of this allergy, does not recall much info.   Prednisone  Other (See Comments)    sweats   Lyrica  [Pregabalin ]     Wobbly, more than sleepy   Benzonatate  Itching and Rash   Sulfa Antibiotics Other (See Comments)    Doesn't remember Other reaction(s): Unknown    Review of Systems: Negative except as noted in the HPI.  Objective:  There were no vitals filed for this visit. [name] [name] is a pleasant 89 y.o. female thin build in NAD. AAO x 3.  Vascular Examination: Capillary refill time immediate b/l. Vascular status intact b/l with palpable pedal pulses. Pedal hair diminished b/l. No pain with calf compression b/l. Skin temperature gradient WNL b/l. No cyanosis or clubbing b/l. No ischemia or gangrene noted b/l.   Neurological Examination: Sensation grossly intact b/l with 10 gram monofilament. Pt has subjective symptoms of neuropathy.  Dermatological Examination: Pedal skin with normal turgor, texture and tone b/l.  No open wounds. No interdigital macerations.   Toenails 2-5 b/l and right great toe thick, discolored, elongated with  subungual debris and pain on dorsal palpation.   Anonychia noted L hallux, Nailbed(s) epithelialized.   Musculoskeletal Examination: Pain on palpation posterior aspect of both heel pads. No evidence of deep tissue injury. No blisters, no fluctuance. Muscle strength 4/5 to all lower extremity muscle groups bilaterally. Utilizes wheelchair for mobility assistance.  Radiographs: None  Assessment/Plan: 1. Pain due to onychomycosis of toenails of both feet   2. Pressure injury of left heel, unstageable (HCC)   3. Pressure injury of right heel, unstageable (HCC)   4. Neuropathy of both feet   -Discussed pressure injury of heels with patient and son. Offered heel protectors available for purchase today. Son declines and I instructed him how to float heels on pillows while she is in bed. He related understanding. -Family member/caregiver/POA instructed to continue pressure precautions floating heels when patient is in bed. -Mycotic toenails 2-5 bilaterally and right great toe were debrided in length and girth with sterile nail nippers and dremel without iatrogenic bleeding. -Patient/POA to call should there be question/concern in the interim.   Return in about 3 months (around 12/01/2024).  [name] [name], DPM      [facility] LOCATION: [address]  [place], [place] [address]                   Office [phone]   [facility] LOCATION: [address] [place], [place] [address] Office [phone]

## 2024-10-22 ENCOUNTER — Other Ambulatory Visit: Payer: Self-pay | Admitting: Family

## 2024-10-22 NOTE — Telephone Encounter (Signed)
 Will send to Ngetich, Dinah C, NP for review and approval

## 2024-11-07 ENCOUNTER — Other Ambulatory Visit: Payer: Self-pay | Admitting: Family

## 2024-11-07 DIAGNOSIS — I10 Essential (primary) hypertension: Secondary | ICD-10-CM

## 2024-12-29 ENCOUNTER — Ambulatory Visit: Admitting: Podiatry

## 2025-02-08 ENCOUNTER — Ambulatory Visit: Admitting: Family
